# Patient Record
Sex: Male | Born: 1975
Health system: Southern US, Community
[De-identification: ages and names within clinical notes are randomized; demographics above are authoritative.]

## PROBLEM LIST (undated history)

## (undated) ENCOUNTER — Emergency Department (HOSPITAL_COMMUNITY): Admission: EM | Payer: Medicare Other

## (undated) DIAGNOSIS — J40 Bronchitis, not specified as acute or chronic: Secondary | ICD-10-CM

## (undated) DIAGNOSIS — E119 Type 2 diabetes mellitus without complications: Secondary | ICD-10-CM

## (undated) DIAGNOSIS — F319 Bipolar disorder, unspecified: Secondary | ICD-10-CM

## (undated) DIAGNOSIS — E785 Hyperlipidemia, unspecified: Secondary | ICD-10-CM

## (undated) DIAGNOSIS — F200 Paranoid schizophrenia: Secondary | ICD-10-CM

## (undated) DIAGNOSIS — F209 Schizophrenia, unspecified: Secondary | ICD-10-CM

## (undated) HISTORY — PX: TESTICLE IMPLANTATION TO THIGH: SHX6432

## (undated) HISTORY — PX: TESTICLE SURGERY: SHX794

## (undated) NOTE — *Deleted (*Deleted)
.  Psychoeducational Group Note    Date: 02-05-20 Time: 0900-1000    Goal Setting   Purpose of Group: This group helps to provide patients with the steps of setting a goal that is specific, measurable, attainable, realistic and time specific. A discussion on how we keep ourselves stuck with negative self talk.    Participation Level:  Active  Participation Quality limited:    Affect:  flat  Cognitive:  limited  Insight:  limited  Engagement in Group:  Engaged  Additional Comments:  Pt could not give an energy level. Interrupted the group several times   Dione Housekeeper

---

## 2003-10-05 ENCOUNTER — Emergency Department (HOSPITAL_COMMUNITY): Admission: EM | Admit: 2003-10-05 | Discharge: 2003-10-05 | Payer: Self-pay | Admitting: Emergency Medicine

## 2006-07-28 ENCOUNTER — Ambulatory Visit: Payer: Self-pay | Admitting: Orthopedic Surgery

## 2006-07-29 ENCOUNTER — Encounter (HOSPITAL_COMMUNITY): Admission: RE | Admit: 2006-07-29 | Discharge: 2006-09-03 | Payer: Self-pay | Admitting: Orthopaedic Surgery

## 2008-01-08 ENCOUNTER — Encounter: Admission: RE | Admit: 2008-01-08 | Discharge: 2008-01-08 | Payer: Self-pay | Admitting: Family Medicine

## 2011-01-12 ENCOUNTER — Emergency Department (HOSPITAL_COMMUNITY)
Admission: EM | Admit: 2011-01-12 | Discharge: 2011-01-12 | Disposition: A | Discharge status: Home / Self Care | Payer: Medicare Other | Attending: Emergency Medicine | Admitting: Emergency Medicine

## 2011-01-12 ENCOUNTER — Encounter: Payer: Self-pay | Admitting: *Deleted

## 2011-01-12 DIAGNOSIS — F17200 Nicotine dependence, unspecified, uncomplicated: Secondary | ICD-10-CM | POA: Insufficient documentation

## 2011-01-12 DIAGNOSIS — F39 Unspecified mood [affective] disorder: Secondary | ICD-10-CM | POA: Insufficient documentation

## 2011-01-12 DIAGNOSIS — F200 Paranoid schizophrenia: Secondary | ICD-10-CM | POA: Insufficient documentation

## 2011-01-12 DIAGNOSIS — E785 Hyperlipidemia, unspecified: Secondary | ICD-10-CM | POA: Insufficient documentation

## 2011-01-12 HISTORY — DX: Paranoid schizophrenia: F20.0

## 2011-01-12 HISTORY — DX: Hyperlipidemia, unspecified: E78.5

## 2011-01-12 LAB — DIFFERENTIAL
Basophils Absolute: 0.1 10*3/uL (ref 0.0–0.1)
Basophils Relative: 0 % (ref 0–1)
Eosinophils Absolute: 0.3 10*3/uL (ref 0.0–0.7)
Lymphs Abs: 3.7 10*3/uL (ref 0.7–4.0)
Monocytes Absolute: 0.7 10*3/uL (ref 0.1–1.0)
Monocytes Relative: 4 % (ref 3–12)
Neutro Abs: 12.3 10*3/uL — ABNORMAL HIGH (ref 1.7–7.7)

## 2011-01-12 LAB — RAPID URINE DRUG SCREEN, HOSP PERFORMED
Cocaine: NOT DETECTED
Tetrahydrocannabinol: NOT DETECTED

## 2011-01-12 LAB — CBC
HCT: 43.8 % (ref 39.0–52.0)
Hemoglobin: 15.6 g/dL (ref 13.0–17.0)
RBC: 5.04 MIL/uL (ref 4.22–5.81)
RDW: 12.2 % (ref 11.5–15.5)
WBC: 17 10*3/uL — ABNORMAL HIGH (ref 4.0–10.5)

## 2011-01-12 LAB — BASIC METABOLIC PANEL: Sodium: 137 mEq/L (ref 135–145)

## 2011-01-12 NOTE — ED Provider Notes (Signed)
History     CSN: [id number] Arrival date & time: 01/12/2011  4:24 PM Scribed for Donnetta Hutching, MD, the patient was seen in room APA17/APA17. This chart was scribed by Katha Cabal. This patient's care was started at 5:02PM.    Chief Complaint  Patient presents with  . Medical Clearance   HPI  [name] is a 35 y.o. male brought in by mother to the Emergency Department complaining of AMS.  Patient is upset overall increasing financial debt. Patient states that his biological father is a Programmer, multimedia and believes that he is involved in an Internet sweepstakes gambling business.  Patient expressed concern regarding his father no being willing to help him get out of debt.  Adds that his father "has all that money and won't help me". Denies SI and HI.  Patient is a third shift grocery stocker.  Patient admits previous drug use but no current drug use.  Patient's mother states that her son has hx of paranoid schizophrenia (compliant with medication) and has been confused.   Patient has been treated previously at [facility].     PAST MEDICAL HISTORY:  Past Medical History  Diagnosis Date  . Paranoid schizophrenia   . Hyperlipidemia     PAST SURGICAL HISTORY:  Past Surgical History  Procedure Date  . Testicle surgery     MEDICATIONS:  Previous Medications   ATORVASTATIN (LIPITOR) 20 MG TABLET    Take 20 mg by mouth daily.       ALLERGIES:  Allergies as of 01/12/2011  . (No Known Allergies)     FAMILY HISTORY:  History reviewed. No pertinent family history.   SOCIAL HISTORY: History   Social History  . Marital Status: Single    Spouse Name: N/A    Number of Children: N/A  . Years of Education: N/A   Social History Main Topics  . Smoking status: Current Everyday Smoker -- 1.0 packs/day  . Smokeless tobacco: None  . Alcohol Use: No  . Drug Use: No  . Sexually Active: Yes   Other Topics Concern  . None   Social History Narrative  . None     Review of Systems  10  Systems reviewed and are negative for acute change except as noted in the HPI.    Physical Exam  Nursing note and vitals reviewed. Constitutional: He is oriented to person, place, and time. No distress.       Appearance consistent with age of record  HENT:  Head: Normocephalic and atraumatic.  Right Ear: External ear normal.  Left Ear: External ear normal.  Nose: Nose normal.  Mouth/Throat: Oropharynx is clear and moist.  Eyes: Conjunctivae are normal.  Neck: Neck supple.  Cardiovascular: Normal rate and regular rhythm.  Exam reveals no gallop and no friction rub.   No murmur heard. Pulmonary/Chest: Effort normal and breath sounds normal. He has no wheezes. He has no rhonchi. He has no rales. He exhibits no tenderness.  Abdominal: Soft. There is no tenderness.  Musculoskeletal: Normal range of motion.       Normal appearance of extremities  Neurological: He is alert and oriented to person, place, and time. No sensory deficit.  Skin: No rash noted.       Color normal  Psychiatric: He has a normal mood and affect.    ED Course  Procedures  OTHER DATA REVIEWED: Nursing notes, vital signs, and past medical records reviewed.   LABS / RADIOLOGY:  Results for orders placed during the hospital  encounter of 01/12/11  CBC      Component Value Range   WBC 17.0 (*) 4.0 - 10.5 (K/uL)   RBC 5.04  4.22 - 5.81 (MIL/uL)   Hemoglobin 15.6  13.0 - 17.0 (g/dL)   HCT 40.9  81.1 - 91.4 (%)   MCV 86.9  78.0 - 100.0 (fL)   MCH 31.0  26.0 - 34.0 (pg)   MCHC 35.6  30.0 - 36.0 (g/dL)   RDW 78.2  95.6 - 21.3 (%)   Platelets 293  150 - 400 (K/uL)  DIFFERENTIAL      Component Value Range   Neutrophils Relative 72  43 - 77 (%)   Neutro Abs 12.3 (*) 1.7 - 7.7 (K/uL)   Lymphocytes Relative 22  12 - 46 (%)   Lymphs Abs 3.7  0.7 - 4.0 (K/uL)   Monocytes Relative 4  3 - 12 (%)   Monocytes Absolute 0.7  0.1 - 1.0 (K/uL)   Eosinophils Relative 2  0 - 5 (%)   Eosinophils Absolute 0.3  0.0 - 0.7  (K/uL)   Basophils Relative 0  0 - 1 (%)   Basophils Absolute 0.1  0.0 - 0.1 (K/uL)  BASIC METABOLIC PANEL      Component Value Range   Sodium 137  135 - 145 (mEq/L)   Potassium 3.4 (*) 3.5 - 5.1 (mEq/L)   Chloride 101  96 - 112 (mEq/L)   CO2 28  19 - 32 (mEq/L)   Glucose, Bld 100 (*) 70 - 99 (mg/dL)   BUN 11  6 - 23 (mg/dL)   Creatinine, Ser 0.86  0.50 - 1.35 (mg/dL)   Calcium 9.8  8.4 - 57.8 (mg/dL)   GFR calc non Af Amer >60  >60 (mL/min)   GFR calc Af Amer >60  >60 (mL/min)  ETHANOL      Component Value Range   Alcohol, Ethyl (B) <11  0 - 11 (mg/dL)     No results found.     ED COURSE / COORDINATION OF CARE: 5:07 PM  Counsel with Behavioral Health.   Orders Placed This Encounter  Procedures  . CBC  . Differential  . Basic metabolic panel  . Ethanol  . Urine rapid drug screen (hosp performed)  . Consult to call act team    MDM: Patient has had mental health problems in the past. Mother reports some tangential thinking. Will give behavioral health consult. Elevated white count noted. No clinical significance. Patient is not suicidal or homicidal     MEDICATIONS GIVEN IN THE E.D. Scheduled Meds:   Continuous Infusions:    I personally performed the services described in this documentation, which was scribed in my presence. The recorded information has been reviewed and considered. Donnetta Hutching, MD            Donnetta Hutching, MD 01/12/11 2107

## 2011-01-12 NOTE — ED Notes (Signed)
Pt mother brings pt to ed today d/t behavioral disturbances. Pts mother states pt has been confused, talking about fixing cars he does not have, having outbreaks at work.

## 2011-03-11 ENCOUNTER — Encounter (HOSPITAL_COMMUNITY): Payer: Self-pay | Admitting: Emergency Medicine

## 2011-03-11 ENCOUNTER — Inpatient Hospital Stay (HOSPITAL_COMMUNITY)
Admission: AD | Admit: 2011-03-11 | Discharge: 2011-03-15 | DRG: 885 | Disposition: A | Discharge status: Home / Self Care | Payer: Medicare Other | Source: Ambulatory Visit | Attending: Psychiatry | Admitting: Psychiatry

## 2011-03-11 ENCOUNTER — Emergency Department (HOSPITAL_COMMUNITY)
Admission: EM | Admit: 2011-03-11 | Discharge: 2011-03-11 | Disposition: A | Discharge status: Other Acute Inpatient Hospital | Payer: Medicare Other | Source: Home / Self Care | Attending: Emergency Medicine | Admitting: Emergency Medicine

## 2011-03-11 DIAGNOSIS — F17200 Nicotine dependence, unspecified, uncomplicated: Secondary | ICD-10-CM

## 2011-03-11 DIAGNOSIS — F200 Paranoid schizophrenia: Secondary | ICD-10-CM

## 2011-03-11 DIAGNOSIS — E785 Hyperlipidemia, unspecified: Secondary | ICD-10-CM

## 2011-03-11 DIAGNOSIS — F209 Schizophrenia, unspecified: Secondary | ICD-10-CM

## 2011-03-11 DIAGNOSIS — F259 Schizoaffective disorder, unspecified: Principal | ICD-10-CM | POA: Diagnosis present

## 2011-03-11 LAB — DIFFERENTIAL
Basophils Absolute: 0.1 10*3/uL (ref 0.0–0.1)
Eosinophils Relative: 2 % (ref 0–5)
Lymphocytes Relative: 24 % (ref 12–46)
Monocytes Absolute: 0.7 10*3/uL (ref 0.1–1.0)
Monocytes Relative: 5 % (ref 3–12)
Neutro Abs: 8.2 10*3/uL — ABNORMAL HIGH (ref 1.7–7.7)
Neutrophils Relative %: 68 % (ref 43–77)

## 2011-03-11 LAB — CBC
HCT: 41.9 % (ref 39.0–52.0)
Hemoglobin: 14.5 g/dL (ref 13.0–17.0)
MCH: 30.1 pg (ref 26.0–34.0)
MCHC: 34.6 g/dL (ref 30.0–36.0)
MCV: 86.9 fL (ref 78.0–100.0)
RBC: 4.82 MIL/uL (ref 4.22–5.81)

## 2011-03-11 LAB — RAPID URINE DRUG SCREEN, HOSP PERFORMED: Amphetamines: NOT DETECTED

## 2011-03-11 LAB — BASIC METABOLIC PANEL
BUN: 11 mg/dL (ref 6–23)
Chloride: 102 mEq/L (ref 96–112)
Creatinine, Ser: 1.01 mg/dL (ref 0.50–1.35)
GFR calc non Af Amer: >90 mL/min (ref >90)
Sodium: 138 mEq/L (ref 135–145)

## 2011-03-11 LAB — ETHANOL: Alcohol, Ethyl (B): <11 mg/dL (ref 0–11)

## 2011-03-11 MED ORDER — IBUPROFEN 400 MG PO TABS: 600.0000 mg | ORAL_TABLET | Freq: Three times a day (TID) | ORAL | Status: DC | PRN

## 2011-03-11 MED ORDER — HALOPERIDOL 5 MG PO TABS: 5.0000 mg | ORAL_TABLET | Freq: Two times a day (BID) | ORAL | Status: DC

## 2011-03-11 MED ORDER — LORAZEPAM 1 MG PO TABS: 1.0000 mg | ORAL_TABLET | ORAL | Status: DC | PRN

## 2011-03-11 MED ORDER — ACETAMINOPHEN 325 MG PO TABS: 650.0000 mg | ORAL_TABLET | ORAL | Status: DC | PRN

## 2011-03-11 MED ORDER — ONDANSETRON HCL 4 MG PO TABS
4.0000 mg | ORAL_TABLET | Freq: Three times a day (TID) | ORAL | Status: DC | PRN
Start: 2011-03-11 — End: 2011-03-11

## 2011-03-11 MED ORDER — HALOPERIDOL 5 MG PO TABS: 5.0000 mg | ORAL_TABLET | Freq: Once | ORAL | Status: DC

## 2011-03-11 MED ORDER — HALOPERIDOL 5 MG PO TABS
5.0000 mg | ORAL_TABLET | Freq: Once | ORAL | Status: AC
  Administered 2011-03-11: 5 mg via ORAL
  Filled 2011-03-11: qty 1

## 2011-03-11 NOTE — ED Notes (Signed)
Pt escorted to br with assistance of sitter. nad noted.

## 2011-03-11 NOTE — ED Notes (Signed)
Pt presents as a Physicist, medical. Pt brought in by [employer] for "rapping" per pt. Pt talking about wanting to be " a superhero like spiderman". Pt calm and cooperative and makes eye contact. Pt denies SI/HI.

## 2011-03-11 NOTE — ED Provider Notes (Signed)
History     CSN: [id number] Arrival date & time: 03/11/2011  1:45 PM   First MD Initiated Contact with Patient 03/11/11 1549      Chief Complaint  Patient presents with  . Medical Clearance     HPI Chief Complaint: "I want to be a superhero" Onset - an unknown time ago Course - worsening Severity - moderate Associated symptoms - delusions History is limited due to patient's mental status.  He is here for wanting to be a superhero.  He thinks he is spiderman.  He also reports he has an idea for the next [name] biscuit.   He denies HA/CP/SOB/Weakness No recent trauma  Past Medical History  Diagnosis Date  . Paranoid schizophrenia   . Hyperlipidemia     Past Surgical History  Procedure Date  . Testicle surgery     History reviewed. No pertinent family history.  History  Substance Use Topics  . Smoking status: Current Everyday Smoker -- 1.0 packs/day  . Smokeless tobacco: Not on file  . Alcohol Use: No      Review of Systems  All other systems reviewed and are negative.    Allergies  Review of patient's allergies indicates no known allergies.  Home Medications   Current Outpatient Rx  Name Route Sig Dispense Refill  . ATORVASTATIN CALCIUM 20 MG PO TABS Oral Take 20 mg by mouth daily.      Marland Kitchen HALOPERIDOL DECANOATE 100 MG/ML IM SOLN Intramuscular Inject 110 mg into the muscle every 28 (twenty-eight) days.        BP 138/88  Pulse 95  Temp(Src) 99.2 F (37.3 C) (Oral)  Resp 18  Ht 6\' 1"  (1.854 m)  Wt 231 lb (104.781 kg)  BMI 30.48 kg/m2  SpO2 99%  Physical Exam  CONSTITUTIONAL: Well developed/well nourished HEAD AND FACE: Normocephalic/atraumatic EYES: EOMI/PERRL ENMT: Mucous membranes moist NECK: supple no meningeal signs CV: S1/S2 noted, no murmurs/rubs/gallops noted LUNGS: Lungs are clear to auscultation bilaterally, no apparent distress ABDOMEN: soft, nontender, no rebound or guarding NEURO: Pt is awake/alert, moves all  extremitiesx4 EXTREMITIES: pulses normal, full ROM SKIN: warm, color normal PSYCH: friendly, talkative, tells me he is spiderman.  He is alert/oriented x3   ED Course  Procedures   Labs Reviewed  CBC - Abnormal; Notable for the following:    WBC 12.0 (*)    All other components within normal limits  DIFFERENTIAL - Abnormal; Notable for the following:    Neutro Abs 8.2 (*)    All other components within normal limits  BASIC METABOLIC PANEL  ETHANOL  URINE RAPID DRUG SCREEN (HOSP PERFORMED)      MDM  Nursing notes reviewed and considered in documentation All labs/vitals reviewed and considered Previous records reviewed and considered    3:56 PM  D/w ACT will see patient        Joya Gaskins, MD 03/11/11 1557

## 2011-03-11 NOTE — ED Notes (Signed)
Pt requesting to " go somewhere I can smoke. I don't want to go to [facility] I'm telling you now I'm not going where I can't smoke." pt stating to become agitated.

## 2011-03-11 NOTE — ED Notes (Signed)
Dr. Bebe Shaggy made aware of pt becoming increasingly agitated. New order received.

## 2011-03-11 NOTE — ED Notes (Signed)
[name] here and pt recently dc from [facility] 2 days ago. [name] with ACT team faxed info on pt to [facility]. Waiting to here if bed available.

## 2011-03-11 NOTE — ED Notes (Signed)
Dinner tray given

## 2011-03-11 NOTE — BH Assessment (Addendum)
Assessment Note   [name] is an 35 y.o. male.   Axis I: Chronic Paranoid Schizophrenia Axis II: Deferred Axis III:  Past Medical History  Diagnosis Date  . Paranoid schizophrenia   . Hyperlipidemia    Axis IV: other psychosocial or environmental problems Axis V: 21-30 behavior considerably influenced by delusions or hallucinations OR serious impairment in judgment, communication OR inability to function in almost all areas  Past Medical History:  Past Medical History  Diagnosis Date  . Paranoid schizophrenia   . Hyperlipidemia     Past Surgical History  Procedure Date  . Testicle surgery     Family History: History reviewed. No pertinent family history.  Social History:  reports that he has been smoking.  He does not have any smokeless tobacco history on file. He reports that he does not drink alcohol or use illicit drugs.  Allergies: No Known Allergies  Home Medications:  Medications Prior to Admission  Medication Dose Route Frequency Provider Last Rate Last Dose  . acetaminophen (TYLENOL) tablet 650 mg  650 mg Oral Q4H PRN Joya Gaskins, MD      . haloperidol (HALDOL) tablet 5 mg  5 mg Oral BID Joya Gaskins, MD      . haloperidol (HALDOL) tablet 5 mg  5 mg Oral Once Joya Gaskins, MD      . ibuprofen (ADVIL,MOTRIN) tablet 600 mg  600 mg Oral Q8H PRN Joya Gaskins, MD      . LORazepam (ATIVAN) tablet 1 mg  1 mg Oral Q2H PRN Joya Gaskins, MD      . ondansetron [facility]) tablet 4 mg  4 mg Oral Q8H PRN Joya Gaskins, MD       Medications Prior to Admission  Medication Sig Dispense Refill  . atorvastatin (LIPITOR) 20 MG tablet Take 20 mg by mouth daily.          OB/GYN Status:  No LMP for male patient.  General Assessment Data Assessment Number: 3  Living Arrangements: Alone Can pt return to current living arrangement?: Yes Admission Status: Voluntary Is patient capable of signing voluntary admission?: Yes Transfer from: Acute  Hospital Referral Source: Self/Family/Friend  Risk to self Suicidal Ideation: No Suicidal Intent: No Is patient at risk for suicide?: No Suicidal Plan?: No Access to Means: No What has been your use of drugs/alcohol within the last 12 months?: none Other Self Harm Risks: none Triggers for Past Attempts: None known Intentional Self Injurious Behavior: None Family Suicide History: No Recent stressful life event(s): Other (Comment) Persecutory voices/beliefs?: No Depression: No Substance abuse history and/or treatment for substance abuse?: No Suicide prevention information given to non-admitted patients: Not applicable  Risk to Others Homicidal Ideation: No Thoughts of Harm to Others: No Current Homicidal Intent: No Current Homicidal Plan: No Access to Homicidal Means: No Identified Victim: none History of harm to others?: No Assessment of Violence: None Noted Violent Behavior Description: none Does patient have access to weapons?: No Criminal Charges Pending?: No Does patient have a court date: No  Mental Status Report Appear/Hygiene: Improved Eye Contact: Fair Motor Activity: Hyperactivity;Restlessness Speech: Pressured;Loud;Tangential Level of Consciousness: Alert Mood: Euphoric;Preoccupied Affect: Euphoric;Preoccupied;Silly Anxiety Level: None Thought Processes: Irrelevant;Circumstantial;Tangential Judgement: Impaired Orientation: Person;Place;Situation;Time Obsessive Compulsive Thoughts/Behaviors: Moderate  Cognitive Functioning Concentration: Decreased Memory: Recent Impaired;Remote Impaired IQ: Average Insight: Poor Impulse Control: Poor Appetite: Good Sleep: No Change Total Hours of Sleep: 6  Vegetative Symptoms: None  Prior Inpatient/Outpatient Therapy Prior Therapy: Inpatient Prior Therapy  Dates: 11/12 Prior Therapy Facilty/Provider(s): [facility] Reason for Treatment: psychosis            Values / Beliefs Cultural Requests During  Hospitalization: None Spiritual Requests During Hospitalization: None        Additional Information 1:1 In Past 12 Months?: No CIRT Risk: No Elopement Risk: No Does patient have medical clearance?: Yes     Disposition:  Disposition Disposition of Patient: Inpatient treatment program Type of inpatient treatment program: Adult The patient was brought to the ED by law enforcement after he was in a parking lot of gas station. He was playing loud music and rapping along with it. He would not turn down the music or leave so polices fwere called. In the Ed he is very loud and grandiose. He talks about writing [name] commercial. He says he is going somewhere with [name].  He has very pressured speech. He denies any SI or HI.  He is follow by Day James Leriche and was released from [facility] just 2 days ago. On Site Evaluation by:   Reviewed with Physician:     [name] 03/11/2011 4:44 PM  Spoke with [name] at [facility], pt accepted by [name] to [name] room 405-2. [name] agrees with disposition. See support paperwork  [name] 03/10/1910

## 2011-03-11 NOTE — ED Notes (Signed)
Pt wanded by [employer]. Clothes taken and security given jewerly and wallet.

## 2011-03-11 NOTE — ED Notes (Signed)
Pt sleeping at this time. Dr. Bebe Shaggy made aware and PRN haldol dc

## 2011-03-11 NOTE — ED Notes (Signed)
Lisette at [facility] called to Decline Pt. Nurse and Hattie Perch informed.

## 2011-03-12 ENCOUNTER — Encounter (HOSPITAL_COMMUNITY): Payer: Self-pay

## 2011-03-12 DIAGNOSIS — F2089 Other schizophrenia: Secondary | ICD-10-CM

## 2011-03-12 DIAGNOSIS — F259 Schizoaffective disorder, unspecified: Secondary | ICD-10-CM | POA: Diagnosis present

## 2011-03-12 MED ORDER — ALUM & MAG HYDROXIDE-SIMETH 200-200-20 MG/5ML PO SUSP
30.0000 mL | ORAL | Status: DC | PRN
  Administered 2011-03-14: 30 mL via ORAL

## 2011-03-12 MED ORDER — INFLUENZA VIRUS VACC SPLIT PF IM SUSP
0.5000 mL | INTRAMUSCULAR | Status: AC
  Administered 2011-03-12: 0.5 mL via INTRAMUSCULAR

## 2011-03-12 MED ORDER — DIVALPROEX SODIUM 500 MG PO DR TAB
500.0000 mg | DELAYED_RELEASE_TABLET | Freq: Every day | ORAL | Status: DC
  Administered 2011-03-12: 500 mg via ORAL
  Filled 2011-03-12 (×2): qty 1

## 2011-03-12 MED ORDER — MAGNESIUM HYDROXIDE 400 MG/5ML PO SUSP: 30.0000 mL | Freq: Every day | ORAL | Status: DC | PRN

## 2011-03-12 MED ORDER — HALOPERIDOL 5 MG PO TABS
10.0000 mg | ORAL_TABLET | Freq: Every day | ORAL | Status: DC
  Administered 2011-03-12 – 2011-03-14 (×3): 10 mg via ORAL
  Filled 2011-03-12: qty 28
  Filled 2011-03-12 (×3): qty 2

## 2011-03-12 MED ORDER — TRAZODONE HCL 100 MG PO TABS
100.0000 mg | ORAL_TABLET | Freq: Every day | ORAL | Status: DC
Start: 2011-03-12 — End: 2011-03-15
  Administered 2011-03-12 – 2011-03-14 (×3): 100 mg via ORAL
  Filled 2011-03-12 (×2): qty 1
  Filled 2011-03-12: qty 14
  Filled 2011-03-12: qty 1

## 2011-03-12 MED ORDER — NICOTINE 21 MG/24HR TD PT24
21.0000 mg | MEDICATED_PATCH | Freq: Every day | TRANSDERMAL | Status: DC
  Filled 2011-03-12 (×5): qty 1

## 2011-03-12 MED ORDER — ONDANSETRON HCL 4 MG PO TABS: 4.0000 mg | ORAL_TABLET | Freq: Three times a day (TID) | ORAL | Status: DC | PRN

## 2011-03-12 MED ORDER — LORATADINE 10 MG PO TABS
10.0000 mg | ORAL_TABLET | Freq: Every day | ORAL | Status: DC
  Administered 2011-03-12 – 2011-03-15 (×4): 10 mg via ORAL
  Filled 2011-03-12 (×5): qty 1

## 2011-03-12 MED ORDER — IBUPROFEN 600 MG PO TABS: 600.0000 mg | ORAL_TABLET | Freq: Three times a day (TID) | ORAL | Status: DC | PRN

## 2011-03-12 MED ORDER — BENZTROPINE MESYLATE 1 MG PO TABS
1.0000 mg | ORAL_TABLET | Freq: Two times a day (BID) | ORAL | Status: DC
  Administered 2011-03-12 – 2011-03-15 (×6): 1 mg via ORAL
  Filled 2011-03-12: qty 28
  Filled 2011-03-12 (×6): qty 1
  Filled 2011-03-12: qty 28

## 2011-03-12 MED ORDER — ACETAMINOPHEN 325 MG PO TABS: 650.0000 mg | ORAL_TABLET | ORAL | Status: DC | PRN

## 2011-03-12 MED ORDER — LORAZEPAM 1 MG PO TABS: 1.0000 mg | ORAL_TABLET | ORAL | Status: DC | PRN

## 2011-03-12 NOTE — BH Assessment (Signed)
Psychiatric Admission Assessment Adult  Patient Identification:  [name] Date of Evaluation:  03/12/2011  Chief Complaint: agitation  History of Present Illness::  Patient was seen with Dr. Jessie Foot ADRIANN BALLWEG is an 35 y.o. male.  The patient was brought to the ED by law enforcement after he was in a parking lot of gas station. He was playing loud music and rapping along with it. He would not turn down the music or leave so polices were called. In the Ed he was very loud and grandiose. He talks about writing [name] commercial. He says he is going somewhere with [name]. He has very pressured speech. He denies any SI or HI. He is follow by [name] and was released from [facility] just 2 days ago. Patient reported compliant with medications. Lives alone and gave consent to contact mother.    Past Psychiatric History hx of schizophrenia and sees Dr. Eleonore Chiquito and is taking Haldol and Cogentin  Past Medical History:     Past Medical History  Diagnosis Date  . Paranoid schizophrenia   . Hyperlipidemia        Past Surgical History  Procedure Date  . Testicle surgery     Allergies: No Known Allergies  Current Medications:  Prior to Admission medications   Medication Sig Start Date End Date Taking? Authorizing Provider  atorvastatin (LIPITOR) 20 MG tablet Take 20 mg by mouth daily.     Yes Historical Provider, MD  benztropine (COGENTIN) 1 MG tablet Take 1 mg by mouth 2 (two) times daily.     Yes Historical Provider, MD  divalproex (DEPAKOTE) 500 MG DR tablet Take 500 mg by mouth at bedtime.     Yes Historical Provider, MD  loratadine (CLARITIN) 10 MG tablet Take 10 mg by mouth daily.     Yes Historical Provider, MD  traZODone (DESYREL) 100 MG tablet Take 100 mg by mouth at bedtime.     Yes Historical Provider, MD  haloperidol decanoate (HALDOL DECANOATE) 100 MG/ML injection Inject 130 mg into the muscle every 28 (twenty-eight) days.     Historical Provider, MD     Social History: Lives alone.  reports that he has been smoking Cigarettes.  He has been smoking about 1 pack James day. He does not have any smokeless tobacco history on file. He reports that he does not drink alcohol or use illicit drugs.   Family History:    History reviewed. No pertinent family history.  Mental Status Examination/Evaluation: Objective:  Appearance: Fairly Groomed  Psychomotor Activity:  Normal  Eye Contact::  Good  Speech:  Normal Rate  Volume:  Normal  Mood:    Affect:  Full Range  Thought Process:  Linear  Orientation:  Full  Thought Content:  Hallucinations: None  Suicidal Thoughts:  No  Homicidal Thoughts:  No  Judgement:  Impaired  Insight:  Shallow    Assessment:    AXIS I Schizoaffective Disorder  AXIS II Deferred  AXIS III See medical history.  AXIS IV other psychosocial or environmental problems  AXIS V 41-50 serious symptoms     Treatment Plan Summary: 1 initiate Haldol 10mg  Qhs, congentin 1 mg QHS,  2. Will obtain collateral information 3. Estimated LOS 4-5 days   AHLUWALIA,SHAMSHER S 11/6/201211:40 AM

## 2011-03-12 NOTE — Discharge Planning (Addendum)
Met with patient in Aftercare Planning Group.  He was agitated and demanding, easily irritated by any question or comment posed by Case Manager.  Refused to sign consent for [place] to get [facility] records, although he said we could call them for those records.  Did not seem to understand they will not be released without written consent, and did not want to hear explanation.    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  Most notable element of his presentation was the rapid irritation.  Patient lives alone and will return there, probably with father transporting.  He said it is okay for [place] to talk with his father Saeed Toren at [phone] but does not remember if signed consent.  Has been a patient at [facility] for years.  Patient had marked his depression at a 10 but when the scale was explained to him, he said he has absolutely no depression.  He stated he has no problem with being in the hospital since it is "all paid so why not?"  Case Manager contacted [facility] to ask whether he is a current patient.  He does have Medicaid so it might be possible to do a referral there if he is not already served by them.  Ambrose Mantle, LCSW 03/12/2011, 10:09 AM  Per State Regulation 482.30  This chart was reviewed for medical necessity with respect to the patient's Admission/Duration of stay.    Ambrose Mantle, LCSW  03/12/2011  Next review due:  03/15/2011

## 2011-03-12 NOTE — Progress Notes (Signed)
  He has been calm and cooperative today. Has not been rapping. Denies having  any discomfort, has been up and about and to groups  poor participation  In group. Stated that  He was here because the police brought him did not know why. Self inventory  Depression  1, hopelessness  1,  Denies SI thoughts.  Denies  A/H,V/H, .

## 2011-03-12 NOTE — Progress Notes (Signed)
  Pt. Presented to [facility] via  Police.  It was reported that patient thought he was spiderman and rapper Lil'Wayne.  Pt. Denies this with Clinical research associate.  Pt. States that he was on the street rapping just to see what people thought of his singing, to see if he could make it as a singer and the police ask him if he wanted to go to the hospital.  Pt. States that he told them he would do whatever they told him to do.  Pt. Was sleepy and difficult to assess but was calm and cooperative through most of the assessment.  Pt. Was agitated at times and said he was tired of answering the same questions over and over and just wanted to go to bed.  Writer assured pt. This would be his last questions for the night.  Pt. Was oriented to the adult unit and was offered food and fluids and escorted to the 400 hall.

## 2011-03-12 NOTE — Progress Notes (Signed)
Suicide Risk Assessment  Admission Assessment     Demographic factors:  Assessment Details Time of Assessment: Admission Information Obtained From: Patient Current Mental Status:  Current Mental Status:  (denies SI) Loss Factors:  Loss Factors:  (NA) Historical Factors:  Historical Factors:  (NA) Risk Reduction Factors:  Risk Reduction Factors: Employed  CLINICAL FACTORS:   Schizophrenia:   Paranoid or undifferentiated type  COGNITIVE FEATURES THAT CONTRIBUTE TO RISK:  Thought constriction (tunnel vision)    SUICIDE RISK:   Minimal: No identifiable suicidal ideation.  Patients presenting with no risk factors but with morbid ruminations; may be classified as minimal risk based on the severity of the depressive symptoms  PLAN OF CARE:   [name],[name] 03/12/2011, 12:16 PM

## 2011-03-12 NOTE — Progress Notes (Signed)
Pt. Has been in the dayroom interacting with his peers and staff.  Pt. Denies AH, SI and or HI.  Pt. States in group that he will be going to a local record company at discharge to see if he can cut a deal.  When alone pt. Training and development officer he will be returning to his apartment where he lives alone.  Pt. Also states that he has a job but will not elaborate on what he does.  Pt. Is calm and cooperative but becomes easily agitated when ask too many questions.  Unsure if pt. Believes he will get  A record deal or if pt. Is trying to impress a male peer.  No complaints of pain or discomfort noted at this time.

## 2011-03-12 NOTE — Tx Team (Signed)
Initial Interdisciplinary Treatment Plan  PATIENT STRENGTHS: (choose at least two) Active sense of humor Capable of independent living  PATIENT STRESSORS: Denies Stressors   PROBLEM LIST: Problem List/Patient Goals Date to be addressed Date deferred Reason deferred Estimated date of resolution  Pt. Denies any issues or goals 03/12/11     Psychosis/Reported by [employer] 03/12/11     Pt. Was rapping on the street corner told ED staff he was spiderman or Sanda Klein' Deniece Portela 03/12/11     Pt. Denies SI, HI or AH                                     DISCHARGE CRITERIA:  Ability to meet basic life and health needs Need for constant or close observation no longer present Verbal commitment to aftercare and medication compliance  PRELIMINARY DISCHARGE PLAN: Attend PHP/IOP Outpatient therapy Participate in family therapy Return to previous living arrangement Return to previous work or school arrangements  PATIENT/FAMIILY INVOLVEMENT: This treatment plan has been presented to and reviewed with the patient, SHENANDOAH VANDERGRIFF, and/or family member, .  The patient and family have been given the opportunity to ask questions and make suggestions.  Cooper Render 03/12/2011, 2:20 AM

## 2011-03-12 NOTE — Progress Notes (Signed)
[facility] Group Notes:  (Counselor/Nursing/MHT/Case Management/Adjunct)  03/12/2011 2:11 PM  Type of Therapy:  Group therapy  Participation Level:  Minimal  Participation Quality:  Attentive  Affect:  Blunted  Cognitive:  Oriented  Insight:  None  Engagement in Group:  Limited  Engagement in Therapy:  Limited  Modes of Intervention:  Education, Orientation and Support  Summary of Progress/Problems: Patient was avoidant of questions. Stated he was here to get away from [place]. Stated that police brought him here but could not explain why they had brought him here.   Ebone Alcivar, [name] 03/12/2011, 2:11 PM

## 2011-03-13 MED ORDER — DIVALPROEX SODIUM 500 MG PO DR TAB
1000.0000 mg | DELAYED_RELEASE_TABLET | Freq: Every day | ORAL | Status: DC
  Administered 2011-03-13 – 2011-03-14 (×2): 1000 mg via ORAL
  Filled 2011-03-13: qty 2
  Filled 2011-03-13: qty 1
  Filled 2011-03-13: qty 2
  Filled 2011-03-13: qty 28

## 2011-03-13 NOTE — Progress Notes (Signed)
  Pt. Was in dayroom interacting with his peers and staff. Pt. Reports he will be discharging in two days and will return to his apartment where he lives alone.  Pt. Is much calmer and less grandious tonight than the prior nights.  Pt. Rates his depression a 0.  Pt. Has no complaints of pain or discomfort noted this pm.

## 2011-03-13 NOTE — Progress Notes (Addendum)
Pt was up in dayroom upon first assessment.  He was given his self-assessment to fill out.  He rated both his depression and hopelessness 0.  He denied he ever felt depressed or suicidal.  He has been up for all groups and is interacting appropriate for the most part.  He is still a bit intrusive when in the presences of his peers.  He is still talking about becoming a Airline pilot.  He stated,"I had to contact [employer] to become my spiderman"  He went on to say that she has made up his own comic book characters and he enjoy reading and playing.  He did tell the treatment team this morning that he now realizes that he shouldn't put his "chains" on and start rapping in a crowd unless he had permission or was filing a video.  He is unsure of the exact date of his haldol decanoate so CM is suppose to find out and let Dr. Rogers Blocker know.  His new orders for today was to d/c prn tylenol and increase pt's depakote to 100 mg at bedtime.  He plans to return home at time of discharge.  ELOS 1-2 days. Pt will be followed by an ACT team when he goes home.

## 2011-03-13 NOTE — Progress Notes (Signed)
[facility] Group Notes:  (Counselor/Nursing/MHT/Case Management/Adjunct)  03/13/2011 4:10 PM  Type of Therapy:  Group Therapy  Participation Level:  Active  Participation Quality:  Attentive  Affect:  Appropriate  Cognitive:  Oriented  Insight:  Limited  Engagement in Group:  Limited  Engagement in Therapy:  Limited  Modes of Intervention:  Clarification, Education and Exploration  Summary of Progress/Problems: Patient discussed knowing the right people so he can make "millions". He stated he plans on rapping in his car while riding down the highway instead of rapping in public places. Patient has pretty good insight on understanding the situation and understanding what he should and should not do.   Wilmon Arms, Intern 03/13/2011, 4:10 PM Cosigned by Veto Kemps, MT-BC

## 2011-03-13 NOTE — Progress Notes (Signed)
  [name] is a 35 y.o. male [id number] 02-09-1976  Subjective/Objective: Patient is doing better.  He is less agitated, less disorganized, denies A/V hallucinations, denies SI/HI, is compliant with medications.  His affect has improved a lot.  His speech is normal in rhythm and volume, no flight of ideas is present.  Discussed with the patient about his medications.  We will get confirmation on his last dose of Haldol from [facility].   History   Social History  . Marital Status: Single    Spouse Name: N/A    Number of Children: N/A  . Years of Education: N/A   Social History Main Topics  . Smoking status: Current Everyday Smoker -- 1.0 packs/day    Types: Cigarettes  . Smokeless tobacco: None  . Alcohol Use: No  . Drug Use: No  . Sexually Active: Yes   Other Topics Concern  . None   Social History Narrative  . None     Lab Results:   BMET    Component Value Date/Time   NA 138 03/11/2011 1432   K 4.0 03/11/2011 1432   CL 102 03/11/2011 1432   CO2 27 03/11/2011 1432   GLUCOSE 94 03/11/2011 1432   BUN 11 03/11/2011 1432   CREATININE 1.01 03/11/2011 1432   CALCIUM 9.4 03/11/2011 1432   GFRNONAA >90 03/11/2011 1432   GFRAA >90 03/11/2011 1432    Medications:  Scheduled:     . benztropine  1 mg Oral BID  . divalproex  500 mg Oral QHS  . haloperidol  10 mg Oral QHS  . loratadine  10 mg Oral Daily  . nicotine  21 mg Transdermal Q0600  . traZODone  100 mg Oral QHS     PRN Meds acetaminophen, alum & mag hydroxide-simeth, ibuprofen, LORazepam, magnesium hydroxide, ondansetron   Plan: 1.  Continue current treatment plan 2.  ELOS 2-3 days 3.  I told the counselor to speak with the family to go over safety concerns and collateral information. 4.  No agitation reported by staff. 5.  No side effects reported by the patient from medications. 6.  Psychoeducation given to the patient.     [name] S 03/13/2011

## 2011-03-13 NOTE — Tx Team (Signed)
Interdisciplinary Treatment Plan Update (Adult)  Date:  03/13/2011  Time Reviewed:  8:32 AM   Progress in Treatment: Attending groups: Yes. and As evidenced by:  documented in chart Participating in groups:  Yes. and As evidenced by:  speaks when spoken to, does better 1:1 than in group Taking medication as prescribed:  Yes. and As evidenced by:  no refusals Tolerating medication:  Yes. and As evidenced by:  no noted or reported side effects Family/Significant othe contact made:  No, will contact:  mother (we have now been told she is also his payee) Patient understands diagnosis:  No. and As evidenced by:  no insight, poor judgment due to chronic illness Discussing patient identified problems/goals with staff:  Yes. and As evidenced by:  discusses freely what superheroes he wants to be Medical problems stabilized or resolved:  Yes. and As evidenced by:  no medical issues noted Denies suicidal/homicidal ideation: Yes. Issues/concerns per patient self-inventory:  No. Other:  New problem(s) identified: No, Describe:  is much less agitated  Reason for Continuation of Hospitalization: Aggression Delusions  Medication stabilization  Interventions implemented related to continuation of hospitalization:  Medication monitoring and adjustment, safety checks Q15 min., group therapy, psychoeducation, collateral contact  Additional comments:  Mother is payee, patient sees her fairly often.  Patient was visited by [facility] today and agreed to start services with that team.  Estimated length of stay:  3-4 days  Discharge Plan:  Will return to apartment by himself, and will follow up with [facility].  New goal(s):  Has been intermittently agitated throughout hospitalization  Review of initial/current patient goals per problem list:   1.  Goal(s):  Reduce psychotic symptoms to baseline  Met:  No  Target date:  By Discharge   As evidenced by:  Delusions getting close to baseline  2.   Goal (s):  Patient wants to be able to rap wtihout getting in trouble  Met:  Yes  Target date:  By Discharge   As evidenced by: able to identify need to only rap when alone, and not go into his dress/rapping routine unless has permission from the place where he is going to do it  3.  Goal(s):  Reduce agitation to manageable level  Met:  Yes  Target date:  By Discharge   As evidenced by:  Does better 1:1 than in group setting    Attendees: Patient:  [name]  03/13/2011  8:32 AM   Family:     Physician:  Dr. Eulogio Ditch 03/13/2011  8:32 AM   Nursing:   Robbie Louis, RN 03/13/2011  8:32 AM   Case Manager:  Ambrose Mantle, LCSW 03/13/2011  8:32 AM   Counselor:  Veto Kemps, MT-BC 03/13/2011  8:32 AM   Other:  Wilmon Arms, counseling intern 03/13/2011  8:32 AM   Other:     Other:     Other:      Scribe for Treatment Team:   Sarina Ser, 03/13/2011, 8:32 AM

## 2011-03-14 DIAGNOSIS — F259 Schizoaffective disorder, unspecified: Principal | ICD-10-CM

## 2011-03-14 NOTE — Discharge Planning (Signed)
Met with patient in Aftercare Planning Group.  He remains calm and reasonable, is no longer cursing or acting inappropriately.  Able to distinguish fantasy from reality.  Likely discharge tomorrow.  Researched how to set up follow-up with [facility] and whether that has to go through hospital reentry clinic.  Am awaiting call back with appointment time for ACTT.  Ambrose Mantle, LCSW 03/14/2011, 4:56 PM

## 2011-03-14 NOTE — Progress Notes (Signed)
[facility] Group Notes:  (Counselor/Nursing/MHT/Case Management/Adjunct)  03/14/2011 3:14 PM  Type of Therapy:  1:15PM Music Therapy  Participation Level: Limited  Participation Quality:  Drowsy and Inattentive  Affect: Appropriate  Cognitive:  Oriented  Insight:  Limited  Engagement in Group:  Limited  Engagement in Therapy:  Limited  Modes of Intervention:  Exploration and Music  Summary of Progress/Problems: Patient was sleeping during most of group discussion. However, he stated that one of the songs made him think of [place]. He also stated that music could be used as a tribute to others. He described losing three friends.   Wilmon Arms 03/14/2011, 3:14 PM

## 2011-03-14 NOTE — H&P (Addendum)
  I reviewed physical exam done in the ed, assessed the patient and agree with findings.

## 2011-03-14 NOTE — Progress Notes (Signed)
Patient has been appropriate and cooperative today.  Has attended some groups and was able to go to the gym with a large group of patients.  Talked about his job and how he felt he needed to get off of night shift, which I agree would likely improve his mental status along with his medications.  He is tolerating his medications and his appetite is good.  Has been interacting well with staff and peers today.

## 2011-03-14 NOTE — Progress Notes (Signed)
  Patient up and about. Dressed appropriately. Calm and cooperative. Good eye contact. Reports sleeping and appetite good. Denies psychosis. Denies any side effects to medications. Feels close to discharge. States next time he wants to rap he will not do it at a convenience store.

## 2011-03-14 NOTE — Progress Notes (Signed)
[facility] Group Notes:  (Counselor/Nursing/MHT/Case Management/Adjunct)  03/14/2011 11:35 AM  Type of Therapy:  Group Therapy  Participation Level:  Active  Participation Quality:  Attentive  Affect:  Appropriate  Cognitive:  Oriented  Insight:  Poor  Engagement in Group:  Limited  Engagement in Therapy:  Limited  Modes of Intervention:  Clarification, Education, Problem-solving and Exploration  Summary of Progress/Problems: Patient seemed to direct a lot of his attention and discussion to fame. When patient was asked by the counselor, "What are some ways you can redirect your thinking away from fame?" He then mentioned he often occupies thoughts of fame because he has a lot of debt. He stated he use to gamble in the past and has loans that he needs to pay off. [name] also discussed a time when he was previously admitted to another hospital where he explained his thoughts towards "the death of Donn Pierini and Achille Rich". 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  Wilmon Arms, Intern 03/14/2011, 11:35 AM Cosigned by Veto Kemps, MT-BC

## 2011-03-14 NOTE — Progress Notes (Signed)
Adult Services Patient-Family Contact/Session  Attendees:  Patient's mother, Merlinda Frederick [phone])  Goal(s):  Collateral  Safety Concerns:  None  Narrative:  Mother reported that patient has had 4 hospitalizations since September 28th of this year. Reported that his first hospitalization since [address] [facility]) was in September. He has been at [employer] two times and and overnight visit to [place]. She stated that police have called several times for her to come pick him up from parking lots. Not sure why his behavior has changed but usually is calm, kind, caring and humble. She stated she doesn't even know her son anymore and that he angry about everything. He has threatened to harm a person that he says gave him LSD years ago and he thinks that he is currently making all his problems. She stated that one of the main stressors is financial debt. She stated that he has an addiction to gambling and has taken out several loans that he now can not afford. Mother is the payee but she says and manages his check but he manages to get these loans on his own. She says this is why he has started talking about making it big so he can afford to pay bills, get her a mansion, and other extravagant things. She said he has not talked about being famous before these last episodes. She stated he has always like super heroes. He has been compliant with injections because he doesn't like to take pills. Mother would like for him to stay somewhere long enough that he can get help and not have to come back in the hospital.  Reported to her that a community support team person had been to visit him here and this would be set up at discharge. This support along with [facility] should help him stay out of the hospital. Mother also interested in getting Health Care Power of Attorney in addition to being patient's payee. Mother reported that patient doesn't like living by himself and wants to get a roommate.   Barrier(s):   None  Interventions:  Colateral, discharge planning  Recommendation(s):  Follow up outpatient, community support  Follow-up Required:  No  Explanation:    Veto Kemps 03/14/2011, 12:52 PM

## 2011-03-15 MED ORDER — HALOPERIDOL 10 MG PO TABS: 10.0000 mg | ORAL_TABLET | Freq: Every day | ORAL | Status: DC

## 2011-03-15 MED ORDER — DIVALPROEX SODIUM 500 MG PO DR TAB: 1000.0000 mg | DELAYED_RELEASE_TABLET | Freq: Every day | ORAL | Status: DC

## 2011-03-15 NOTE — Progress Notes (Signed)
Pt D/Ced home. Pt understands all D/C instructions, f/u appointments. Pt denies SI/HI, given samples at D/C

## 2011-03-15 NOTE — Progress Notes (Signed)
Pt up at 2345 complaining of indigestion. He denies any other physical problems. Affect flat, mood neutral, sleepy. Pt given maalox and on R/A at 2445 he is asleep. He denies SI/HI and remains safe. Edsel Petrin RN

## 2011-03-15 NOTE — Progress Notes (Signed)
Suicide Risk Assessment  Discharge Assessment     Demographic factors:  Assessment Details Time of Assessment: Admission Information Obtained From: Patient Current Mental Status:  Current Mental Status:  (denies SI) Risk Reduction Factors:  Risk Reduction Factors: Employed  CLINICAL FACTORS:   Schizophrenia:   Paranoid or undifferentiated type  COGNITIVE FEATURES THAT CONTRIBUTE TO RISK:  Thought constriction (tunnel vision)    SUICIDE RISK:   Minimal: No identifiable suicidal ideation.  Patients presenting with no risk factors but with morbid ruminations; may be classified as minimal risk based on the severity of the depressive symptoms  PLAN OF CARE: SEE DISCHARGE SUMMARY  [name] S 03/15/2011, 9:30 AM

## 2011-03-15 NOTE — Discharge Summary (Signed)
Physician Discharge Summary  Patient ID: [name] MRN: [id number] DOB/AGE: 1975-11-29 35 y.o.  Admit date: 03/11/2011 Discharge date: 03/15/2011  Hospital Course:  [name] is an 35 y.o. male.  The patient was brought to the ED by law enforcement after he was in a parking lot of gas station. He was playing loud music and rapping along with it. He would not turn down the music or leave so polices were called. In the Ed he was very loud and grandiose. He talks about writing [name] commercial. He says he is going somewhere with [name]. He has very pressured speech. He denies any SI or HI. He is follow by [name] and was [facility] from [facility] just 2 days ago.  Patient reported compliant with medications. Lives alone and gave consent to contact mother. She was started on his up regular medications were Haldol 10 mg at bedtime. His Depakote was increased from [phone] mg at bedtime to control his manic symptoms. Patient participated in all the groups no agitation reported by the staff throughout her stay in the hospital. By the time of discharge patient is logical and goal-directed not suicidal homicidal his speech is normal in rate rhythm and volume has no flight of ideas not responding to inner stimuli denies audiovisual hallucinations. Patient mother was contacted and she don't have any safety concerns at the time of discharge. Psychoeducation given to the patient regarding compliance with treatment and with followup in the outpatient setting. Patient had 3 admissions since September but before that since 1988 patient didn't had any admissions. I would recommend the ACT physician to reevaluate his medications and follow him closely. No Haldol decanoate injection was given in this admission.  Discharge Diagnoses:  Principal Problem:  *Schizoaffective disorder currently under remission.   Discharged Condition:    Discharge Orders    Future Orders Please Complete By Expires   Diet - low sodium heart healthy      Activity as tolerated - No restrictions        Current Discharge Medication List    START taking these medications   Details  haloperidol (HALDOL) 10 MG tablet Take 1 tablet (10 mg total) by mouth at bedtime. Qty: 30 tablet, Refills: 0      CONTINUE these medications which have CHANGED   Details  divalproex (DEPAKOTE) 500 MG DR tablet Take 2 tablets (1,000 mg total) by mouth at bedtime. Qty: 60 tablet, Refills: 0      CONTINUE these medications which have NOT CHANGED   Details  atorvastatin (LIPITOR) 20 MG tablet Take 20 mg by mouth daily.      benztropine (COGENTIN) 1 MG tablet Take 1 mg by mouth 2 (two) times daily.      loratadine (CLARITIN) 10 MG tablet Take 10 mg by mouth daily.      traZODone (DESYREL) 100 MG tablet Take 100 mg by mouth at bedtime.      haloperidol decanoate (HALDOL DECANOATE) 100 MG/ML injection Inject 130 mg into the muscle every 28 (twenty-eight) days.          Signed: Kiamesha Samet S 03/15/2011, 11:05 AM

## 2011-03-15 NOTE — Progress Notes (Signed)
[facility] Case Management Discharge Plan:  Will you be returning to the same living situation after discharge: Yes,  live in apartment alone Would you like a referral for services when you are discharged:Yes,  agreeable to remain at [facility], switch to ACTT Do you have access to transportation at discharge:Yes,  mother and stepfather to transport home Do you have the ability to pay for your medications:Yes,  has insurance, no problems paying for meds  Interagency Information:   Release of information to be signed at D/C  Patient to Follow up at:  Follow-up Information    Follow up with [facility]) on 03/20/2011. (Please go to [facility] for your intake appointment at 10:30AM)    Contact information:   [address], [place], Telephone:  [phone]         Patient denies SI/HI:   Yes,  no SI throughout hospital stay    Safety Planning and Suicide Prevention discussed:  Yes,  including risk factors, warning signs, and what to do if SI presents or No.  Barrier to discharge identified:No.  Summary and Recommendations:  There are no barriers to discharge.   Sarina Ser 03/15/2011, 12:21 PM

## 2011-03-15 NOTE — Discharge Summary (Signed)
Discharge Note  Patient:  [name] is an 35 y.o., male DOB:  1976/01/09  Date of Admission:  03/11/2011  Date of Discharge:  03/15/2011  Axis Diagnosis:  Axis I: Schizoaffective Disorder  Level of Care:  OP  Discharge destination:  Home  Is patient on multiple antipsychotic therapies at discharge:  No    Has Patient had three or more failed trials of antipsychotic monotherapy by history:  No  Patient phone:  [phone] (home)  Patient address:   [address] [place] [address] [place] [address],    AHLUWALIA,SHAMSHER S 03/15/2011, 9:31 AM

## 2011-03-15 NOTE — Tx Team (Signed)
Interdisciplinary Treatment Plan Update (Adult)  Date:  03/15/2011  Time Reviewed:  10:32 AM   Progress in Treatment: Attending groups: Yes. Participating in groups:  Yes. Taking medication as prescribed:  Yes. Tolerating medication:  Yes. and As evidenced by:  No reported or noted side effects Family/Significant othe contact made:  Yes, individual(s) contacted:  mother Patient understands diagnosis:  Yes. Discussing patient identified problems/goals with staff:  Yes. Medical problems stabilized or resolved:  Yes. and As evidenced by:  no medical issues Denies suicidal/homicidal ideation: Yes. Issues/concerns per patient self-inventory:  No. Other:  New problem(s) identified: No, Describe:  is stable for discharge  Reason for Continuation of Hospitalization: Not applicable  Interventions implemented related to continuation of hospitalization:  None  Additional comments:  Not applicable  Estimated length of stay:  Discharge today  Discharge Plan:  Live in apartment by self, start with [facility]  New goal(s):  Not applicable  Review of initial/current patient goals per problem list:   1.  Goal(s):  Reduce psychotic symptoms to baseline  Met:  Yes  Target date:  By Discharge   As evidenced by:  Calm, not internally preoccupied, increased insight/judgment  2.  Goal (s):n  Patient wants to be able to rap without getting in trouble  Met:  Yes  Target date:  By Discharge   As evidenced by:  States will find better places to express himself, not a convenience store  3.  Goal(s):  Reduce agitation to manageable level  Met:  Yes  Target date:  By Discharge   As evidenced by:  Very calm, put effort into not cursing   Attendees: Patient:  [name]  03/15/2011  10:32 AM   Family:     Physician:  Dr. Gwenyth Bouillon Ahluwalia 03/15/2011  10:32 AM   Nursing:   Shann Medal, RN 03/15/2011  10:32 AM   Case Manager:  Ambrose Mantle, LCSW 03/15/2011  10:32 AM     Counselor:  Veto Kemps, MT-BC 03/15/2011  10:32 AM   Other: 03/15/2011  10:32 AM   Other:     Other:     Other:      Scribe for Treatment Team:   Sarina Ser, 03/15/2011, 10:32 AM

## 2011-03-18 NOTE — Progress Notes (Signed)
Patient Discharge Instructions:  Dictated admission note faxed, Date faxed:  11/19 D/C instructions faxed, Date faxed:  11/19 D/C Summary faxed, Date faxed:  11/19 Med. Rec. Form faxed, Date faxed:  11/19  [name], 03/18/2011, 3:32 PM 

## 2011-03-25 DIAGNOSIS — E559 Vitamin D deficiency, unspecified: Secondary | ICD-10-CM | POA: Diagnosis not present

## 2011-03-25 DIAGNOSIS — E7800 Pure hypercholesterolemia, unspecified: Secondary | ICD-10-CM | POA: Diagnosis not present

## 2011-03-25 DIAGNOSIS — J309 Allergic rhinitis, unspecified: Secondary | ICD-10-CM | POA: Diagnosis not present

## 2011-03-25 DIAGNOSIS — F200 Paranoid schizophrenia: Secondary | ICD-10-CM | POA: Diagnosis not present

## 2011-03-25 DIAGNOSIS — F259 Schizoaffective disorder, unspecified: Secondary | ICD-10-CM | POA: Diagnosis not present

## 2011-03-25 DIAGNOSIS — E785 Hyperlipidemia, unspecified: Secondary | ICD-10-CM | POA: Diagnosis not present

## 2011-03-25 DIAGNOSIS — E669 Obesity, unspecified: Secondary | ICD-10-CM | POA: Diagnosis not present

## 2011-06-26 DIAGNOSIS — F200 Paranoid schizophrenia: Secondary | ICD-10-CM | POA: Diagnosis not present

## 2011-07-22 DIAGNOSIS — S92309A Fracture of unspecified metatarsal bone(s), unspecified foot, initial encounter for closed fracture: Secondary | ICD-10-CM | POA: Diagnosis not present

## 2011-07-22 DIAGNOSIS — S93609A Unspecified sprain of unspecified foot, initial encounter: Secondary | ICD-10-CM | POA: Diagnosis not present

## 2011-08-21 DIAGNOSIS — F200 Paranoid schizophrenia: Secondary | ICD-10-CM | POA: Diagnosis not present

## 2011-08-21 DIAGNOSIS — Z79899 Other long term (current) drug therapy: Secondary | ICD-10-CM | POA: Diagnosis not present

## 2011-08-22 DIAGNOSIS — S92309A Fracture of unspecified metatarsal bone(s), unspecified foot, initial encounter for closed fracture: Secondary | ICD-10-CM | POA: Diagnosis not present

## 2011-09-17 DIAGNOSIS — F17200 Nicotine dependence, unspecified, uncomplicated: Secondary | ICD-10-CM | POA: Diagnosis not present

## 2011-09-17 DIAGNOSIS — J209 Acute bronchitis, unspecified: Secondary | ICD-10-CM | POA: Diagnosis not present

## 2011-10-10 DIAGNOSIS — F200 Paranoid schizophrenia: Secondary | ICD-10-CM | POA: Diagnosis not present

## 2012-04-04 DIAGNOSIS — Z23 Encounter for immunization: Secondary | ICD-10-CM | POA: Diagnosis not present

## 2012-04-04 DIAGNOSIS — F2089 Other schizophrenia: Secondary | ICD-10-CM | POA: Diagnosis not present

## 2012-04-04 DIAGNOSIS — L708 Other acne: Secondary | ICD-10-CM | POA: Diagnosis not present

## 2012-05-22 DIAGNOSIS — J069 Acute upper respiratory infection, unspecified: Secondary | ICD-10-CM | POA: Diagnosis not present

## 2012-06-25 DIAGNOSIS — L708 Other acne: Secondary | ICD-10-CM | POA: Diagnosis not present

## 2012-06-25 DIAGNOSIS — L819 Disorder of pigmentation, unspecified: Secondary | ICD-10-CM | POA: Diagnosis not present

## 2012-08-20 DIAGNOSIS — E7800 Pure hypercholesterolemia, unspecified: Secondary | ICD-10-CM | POA: Diagnosis not present

## 2012-08-20 DIAGNOSIS — E785 Hyperlipidemia, unspecified: Secondary | ICD-10-CM | POA: Diagnosis not present

## 2012-08-20 DIAGNOSIS — F2089 Other schizophrenia: Secondary | ICD-10-CM | POA: Diagnosis not present

## 2012-11-27 DIAGNOSIS — F339 Major depressive disorder, recurrent, unspecified: Secondary | ICD-10-CM | POA: Diagnosis not present

## 2012-12-14 DIAGNOSIS — Z125 Encounter for screening for malignant neoplasm of prostate: Secondary | ICD-10-CM | POA: Diagnosis not present

## 2012-12-14 DIAGNOSIS — E781 Pure hyperglyceridemia: Secondary | ICD-10-CM | POA: Diagnosis not present

## 2012-12-14 DIAGNOSIS — N529 Male erectile dysfunction, unspecified: Secondary | ICD-10-CM | POA: Diagnosis not present

## 2012-12-14 DIAGNOSIS — E7800 Pure hypercholesterolemia, unspecified: Secondary | ICD-10-CM | POA: Diagnosis not present

## 2013-02-20 DIAGNOSIS — J069 Acute upper respiratory infection, unspecified: Secondary | ICD-10-CM | POA: Diagnosis not present

## 2013-02-20 DIAGNOSIS — Z125 Encounter for screening for malignant neoplasm of prostate: Secondary | ICD-10-CM | POA: Diagnosis not present

## 2013-02-20 DIAGNOSIS — E781 Pure hyperglyceridemia: Secondary | ICD-10-CM | POA: Diagnosis not present

## 2013-02-20 DIAGNOSIS — E119 Type 2 diabetes mellitus without complications: Secondary | ICD-10-CM | POA: Diagnosis not present

## 2013-05-10 DIAGNOSIS — Z23 Encounter for immunization: Secondary | ICD-10-CM | POA: Diagnosis not present

## 2013-05-27 DIAGNOSIS — G4700 Insomnia, unspecified: Secondary | ICD-10-CM | POA: Diagnosis not present

## 2013-05-27 DIAGNOSIS — H579 Unspecified disorder of eye and adnexa: Secondary | ICD-10-CM | POA: Diagnosis not present

## 2013-06-10 DIAGNOSIS — H1030 Unspecified acute conjunctivitis, unspecified eye: Secondary | ICD-10-CM | POA: Diagnosis not present

## 2013-06-17 DIAGNOSIS — L819 Disorder of pigmentation, unspecified: Secondary | ICD-10-CM | POA: Diagnosis not present

## 2013-06-17 DIAGNOSIS — L708 Other acne: Secondary | ICD-10-CM | POA: Diagnosis not present

## 2013-07-26 ENCOUNTER — Emergency Department (EMERGENCY_DEPARTMENT_HOSPITAL)
Admission: EM | Admit: 2013-07-26 | Discharge: 2013-07-27 | Disposition: A | Discharge status: Other Acute Inpatient Hospital | Payer: Medicare Other | Source: Home / Self Care | Attending: Emergency Medicine | Admitting: Emergency Medicine

## 2013-07-26 ENCOUNTER — Emergency Department (HOSPITAL_COMMUNITY)
Admission: EM | Admit: 2013-07-26 | Discharge: 2013-07-26 | Discharge status: Absent without leave | Payer: Medicare Other | Source: Home / Self Care

## 2013-07-26 DIAGNOSIS — F209 Schizophrenia, unspecified: Secondary | ICD-10-CM

## 2013-07-26 DIAGNOSIS — F259 Schizoaffective disorder, unspecified: Secondary | ICD-10-CM | POA: Diagnosis not present

## 2013-07-26 DIAGNOSIS — F17200 Nicotine dependence, unspecified, uncomplicated: Secondary | ICD-10-CM | POA: Insufficient documentation

## 2013-07-26 DIAGNOSIS — F200 Paranoid schizophrenia: Secondary | ICD-10-CM

## 2013-07-26 DIAGNOSIS — E785 Hyperlipidemia, unspecified: Secondary | ICD-10-CM

## 2013-07-26 DIAGNOSIS — Z79899 Other long term (current) drug therapy: Secondary | ICD-10-CM

## 2013-07-26 DIAGNOSIS — Z91199 Patient's noncompliance with other medical treatment and regimen due to unspecified reason: Secondary | ICD-10-CM | POA: Diagnosis not present

## 2013-07-26 DIAGNOSIS — Z9119 Patient's noncompliance with other medical treatment and regimen: Secondary | ICD-10-CM | POA: Diagnosis not present

## 2013-07-26 HISTORY — DX: Schizophrenia, unspecified: F20.9

## 2013-07-27 ENCOUNTER — Inpatient Hospital Stay (HOSPITAL_COMMUNITY)
Admission: AD | Admit: 2013-07-27 | Discharge: 2013-07-30 | DRG: 885 | Disposition: A | Discharge status: Home / Self Care | Payer: Medicare Other | Source: Intra-hospital | Attending: Psychiatry | Admitting: Psychiatry

## 2013-07-27 ENCOUNTER — Encounter (HOSPITAL_COMMUNITY): Payer: Self-pay | Admitting: Behavioral Health

## 2013-07-27 ENCOUNTER — Encounter (HOSPITAL_COMMUNITY): Payer: Self-pay | Admitting: Emergency Medicine

## 2013-07-27 DIAGNOSIS — F17200 Nicotine dependence, unspecified, uncomplicated: Secondary | ICD-10-CM | POA: Diagnosis present

## 2013-07-27 DIAGNOSIS — Z79899 Other long term (current) drug therapy: Secondary | ICD-10-CM

## 2013-07-27 DIAGNOSIS — Z9119 Patient's noncompliance with other medical treatment and regimen: Secondary | ICD-10-CM

## 2013-07-27 DIAGNOSIS — F259 Schizoaffective disorder, unspecified: Principal | ICD-10-CM | POA: Diagnosis present

## 2013-07-27 DIAGNOSIS — Z91199 Patient's noncompliance with other medical treatment and regimen due to unspecified reason: Secondary | ICD-10-CM

## 2013-07-27 DIAGNOSIS — F200 Paranoid schizophrenia: Secondary | ICD-10-CM

## 2013-07-27 DIAGNOSIS — E785 Hyperlipidemia, unspecified: Secondary | ICD-10-CM | POA: Diagnosis present

## 2013-07-27 DIAGNOSIS — F29 Unspecified psychosis not due to a substance or known physiological condition: Secondary | ICD-10-CM | POA: Diagnosis present

## 2013-07-27 HISTORY — DX: Bipolar disorder, unspecified: F31.9

## 2013-07-27 LAB — RAPID URINE DRUG SCREEN, HOSP PERFORMED
Amphetamines: NOT DETECTED
Barbiturates: NOT DETECTED
Benzodiazepines: NOT DETECTED
Cocaine: NOT DETECTED
Opiates: NOT DETECTED
Tetrahydrocannabinol: NOT DETECTED

## 2013-07-27 LAB — COMPREHENSIVE METABOLIC PANEL
ALT: 30 U/L (ref 0–53)
AST: 34 U/L (ref 0–37)
Albumin: 4.1 g/dL (ref 3.5–5.2)
Alkaline Phosphatase: 75 U/L (ref 39–117)
CO2: 26 mEq/L (ref 19–32)
GFR calc non Af Amer: >90 mL/min (ref >90)
Glucose, Bld: 99 mg/dL (ref 70–99)
Potassium: 3.8 mEq/L (ref 3.7–5.3)
Sodium: 139 mEq/L (ref 137–147)
Total Protein: 7.9 g/dL (ref 6.0–8.3)

## 2013-07-27 LAB — LITHIUM LEVEL: Lithium Lvl: <0.25 mEq/L — ABNORMAL LOW (ref 0.80–1.40)

## 2013-07-27 LAB — CBC WITH DIFFERENTIAL/PLATELET
Basophils Absolute: 0 10*3/uL (ref 0.0–0.1)
Basophils Relative: 0 % (ref 0–1)
Eosinophils Absolute: 0.3 10*3/uL (ref 0.0–0.7)
Eosinophils Relative: 2 % (ref 0–5)
HCT: 42.9 % (ref 39.0–52.0)
Hemoglobin: 14.9 g/dL (ref 13.0–17.0)
Lymphocytes Relative: 27 % (ref 12–46)
Lymphs Abs: 2.9 10*3/uL (ref 0.7–4.0)
MCH: 30.5 pg (ref 26.0–34.0)
MCHC: 34.7 g/dL (ref 30.0–36.0)
MCV: 87.7 fL (ref 78.0–100.0)
Monocytes Absolute: 0.5 K/uL (ref 0.1–1.0)
Monocytes Relative: 4 % (ref 3–12)
Neutro Abs: 7 K/uL (ref 1.7–7.7)
Neutrophils Relative %: 67 % (ref 43–77)
Platelets: 329 10*3/uL (ref 150–400)
RBC: 4.89 MIL/uL (ref 4.22–5.81)
RDW: 12.7 % (ref 11.5–15.5)
WBC: 10.6 K/uL — ABNORMAL HIGH (ref 4.0–10.5)

## 2013-07-27 LAB — COMPREHENSIVE METABOLIC PANEL WITH GFR
BUN: 8 mg/dL (ref 6–23)
Calcium: 9.6 mg/dL (ref 8.4–10.5)
Chloride: 101 meq/L (ref 96–112)
Creatinine, Ser: 1.01 mg/dL (ref 0.50–1.35)
GFR calc Af Amer: >90 mL/min (ref >90)
Total Bilirubin: 0.3 mg/dL (ref 0.3–1.2)

## 2013-07-27 LAB — ETHANOL: Alcohol, Ethyl (B): <11 mg/dL (ref 0–11)

## 2013-07-27 MED ORDER — ALUM & MAG HYDROXIDE-SIMETH 200-200-20 MG/5ML PO SUSP
30.0000 mL | ORAL | Status: DC | PRN
  Administered 2013-07-29 – 2013-08-03 (×4): 30 mL via ORAL

## 2013-07-27 MED ORDER — LITHIUM CARBONATE 300 MG PO CAPS
300.0000 mg | ORAL_CAPSULE | Freq: Three times a day (TID) | ORAL | Status: DC
  Administered 2013-07-28: 300 mg via ORAL
  Filled 2013-07-27 (×5): qty 1

## 2013-07-27 MED ORDER — LORATADINE 10 MG PO TABS
10.0000 mg | ORAL_TABLET | Freq: Every day | ORAL | Status: DC
  Administered 2013-07-28 – 2013-08-04 (×8): 10 mg via ORAL
  Filled 2013-07-27 (×12): qty 1

## 2013-07-27 MED ORDER — ACETAMINOPHEN 325 MG PO TABS
650.0000 mg | ORAL_TABLET | Freq: Four times a day (QID) | ORAL | Status: DC | PRN
  Administered 2013-07-28 – 2013-08-02 (×6): 650 mg via ORAL
  Filled 2013-07-27 (×6): qty 2

## 2013-07-27 MED ORDER — BENZTROPINE MESYLATE 1 MG PO TABS
1.0000 mg | ORAL_TABLET | Freq: Two times a day (BID) | ORAL | Status: DC
  Administered 2013-07-28 – 2013-08-04 (×15): 1 mg via ORAL
  Filled 2013-07-27 (×17): qty 1
  Filled 2013-07-27: qty 6
  Filled 2013-07-27: qty 1
  Filled 2013-07-27: qty 6
  Filled 2013-07-27 (×2): qty 1

## 2013-07-27 MED ORDER — TRAZODONE HCL 100 MG PO TABS
100.0000 mg | ORAL_TABLET | Freq: Every day | ORAL | Status: DC
  Administered 2013-07-27 – 2013-07-28 (×2): 100 mg via ORAL
  Filled 2013-07-27 (×5): qty 1

## 2013-07-27 MED ORDER — ARIPIPRAZOLE 10 MG PO TABS
10.0000 mg | ORAL_TABLET | Freq: Every day | ORAL | Status: DC
  Administered 2013-07-28 – 2013-08-04 (×8): 10 mg via ORAL
  Filled 2013-07-27 (×5): qty 1
  Filled 2013-07-27: qty 3
  Filled 2013-07-27 (×5): qty 1

## 2013-07-27 MED ORDER — ALUM & MAG HYDROXIDE-SIMETH 200-200-20 MG/5ML PO SUSP: 30.0000 mL | ORAL | Status: DC | PRN

## 2013-07-27 MED ORDER — IBUPROFEN 400 MG PO TABS
600.0000 mg | ORAL_TABLET | Freq: Three times a day (TID) | ORAL | Status: DC | PRN
  Administered 2013-07-27: 600 mg via ORAL
  Filled 2013-07-27: qty 2

## 2013-07-27 MED ORDER — BENZTROPINE MESYLATE 1 MG PO TABS
1.0000 mg | ORAL_TABLET | Freq: Two times a day (BID) | ORAL | Status: DC
  Administered 2013-07-27: 1 mg via ORAL
  Filled 2013-07-27 (×2): qty 1

## 2013-07-27 MED ORDER — ATORVASTATIN CALCIUM 20 MG PO TABS
20.0000 mg | ORAL_TABLET | Freq: Every day | ORAL | Status: DC
  Administered 2013-07-28 – 2013-08-04 (×8): 20 mg via ORAL
  Filled 2013-07-27 (×7): qty 1
  Filled 2013-07-27: qty 2
  Filled 2013-07-27 (×3): qty 1

## 2013-07-27 MED ORDER — BUPROPION HCL ER (XL) 300 MG PO TB24
300.0000 mg | ORAL_TABLET | Freq: Every day | ORAL | Status: DC
  Administered 2013-07-28: 300 mg via ORAL
  Filled 2013-07-27 (×3): qty 1

## 2013-07-27 MED ORDER — ONDANSETRON HCL 4 MG PO TABS: 4.0000 mg | ORAL_TABLET | Freq: Three times a day (TID) | ORAL | Status: DC | PRN

## 2013-07-27 MED ORDER — ATORVASTATIN CALCIUM 20 MG PO TABS
20.0000 mg | ORAL_TABLET | Freq: Every day | ORAL | Status: DC
  Administered 2013-07-27: 20 mg via ORAL
  Filled 2013-07-27 (×2): qty 1

## 2013-07-27 MED ORDER — NICOTINE POLACRILEX 2 MG MT GUM
2.0000 mg | CHEWING_GUM | OROMUCOSAL | Status: DC | PRN
  Administered 2013-07-28 – 2013-07-30 (×7): 2 mg via ORAL
  Filled 2013-07-27 (×9): qty 1

## 2013-07-27 MED ORDER — ARIPIPRAZOLE 10 MG PO TABS
10.0000 mg | ORAL_TABLET | Freq: Every day | ORAL | Status: DC
  Administered 2013-07-27: 10 mg via ORAL
  Filled 2013-07-27 (×3): qty 1

## 2013-07-27 MED ORDER — LORATADINE 10 MG PO TABS
10.0000 mg | ORAL_TABLET | Freq: Every day | ORAL | Status: DC
  Administered 2013-07-27: 10 mg via ORAL
  Filled 2013-07-27: qty 1

## 2013-07-27 MED ORDER — INFLUENZA VAC SPLIT QUAD 0.5 ML IM SUSP
0.5000 mL | INTRAMUSCULAR | Status: DC
  Filled 2013-07-27: qty 0.5

## 2013-07-27 MED ORDER — MAGNESIUM HYDROXIDE 400 MG/5ML PO SUSP: 30.0000 mL | Freq: Every day | ORAL | Status: DC | PRN

## 2013-07-27 MED ORDER — LORAZEPAM 1 MG PO TABS: 1.0000 mg | ORAL_TABLET | Freq: Three times a day (TID) | ORAL | Status: DC | PRN

## 2013-07-27 MED ORDER — TRAZODONE HCL 50 MG PO TABS
100.0000 mg | ORAL_TABLET | Freq: Every day | ORAL | Status: DC
  Filled 2013-07-27: qty 2

## 2013-07-27 NOTE — [facility] Group Notes (Signed)
Adult Psychoeducational Group Note  Date:  07/27/2013 Time:  8:35 PM  Group Topic/Focus:  Wrap-Up Group:   The focus of this group is to help patients review their daily goal of treatment and discuss progress on daily workbooks.  Participation Level:  Minimal  Participation Quality:  Appropriate  Affect:  Flat  Cognitive:  Appropriate  Insight: Appropriate  Engagement in Group:  Limited  Modes of Intervention:  Discussion  Additional Comments:  [name] is a new admit.  He did not want to share anything about himself in group.  He was quiet and attentive.  [name], [name] A 07/27/2013, 8:35 PM

## 2013-07-27 NOTE — ED Notes (Signed)
Per pt: "[facility] wanted to send me to [place] and I didn't want to go, I don't know where or what it is". Pt denies SI/HI, command hallucinations or paranoid thoughts.parents are here with him

## 2013-07-27 NOTE — Tx Team (Signed)
Initial Interdisciplinary Treatment Plan  PATIENT STRENGTHS: (choose at least two) Financial means Motivation for treatment/growth Supportive family/friends  PATIENT STRESSORS: Financial difficulties Legal issue Medication change or noncompliance   PROBLEM LIST: Problem List/Patient Goals Date to be addressed Date deferred Reason deferred Estimated date of resolution  "I am just ready to go home." 07/27/2013     "I am a little bit depressed." 07/27/2013     Medication cmpliance 07/27/2013     Auditory halluciantions 07/27/2013                                    DISCHARGE CRITERIA:  Ability to meet basic life and health needs Improved stabilization in mood, thinking, and/or behavior Motivation to continue treatment in a less acute level of care Safe-care adequate arrangements made  PRELIMINARY DISCHARGE PLAN: Attend aftercare/continuing care group Return to previous living arrangement  PATIENT/FAMIILY INVOLVEMENT: This treatment plan has been presented to and reviewed with the patient, [name].  The patient and family have been given the opportunity to ask questions and make suggestions.  Angeline SlimHill, Kineta Fudala M 07/27/2013, 10:12 PM

## 2013-07-27 NOTE — [facility] Counselor (Signed)
[facility] Sts that patient has a ACCT team with [facility] and the contact # is [phone].
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
## 2013-07-27 NOTE — ED Notes (Signed)
[facility] bed 1 by Dr. Jannifer FranklinAkintayo pending IVC papers are in order. [facility] Call report to [phone]

## 2013-07-27 NOTE — ED Notes (Signed)
Woke pt up for telepsych consult.

## 2013-07-27 NOTE — ED Notes (Signed)
[facility]. States she will have Renata CapriceConrad put his note in on the telapsych that was done this morning
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
## 2013-07-27 NOTE — Progress Notes (Signed)
Patient will receive a Tele Psych with Nani SkillernJohn Conrad Withrow, NP

## 2013-07-27 NOTE — Progress Notes (Signed)
D   Pt is guarded but cooperative upon assessment   He was requesting a nicotine patch but readily accepted the gum until the AM when he can get a patch   Pt has been pacing the hall and is fidgety but he interacts appropriately with others A   Verbal support given   Medications administered and effectiveness monitored   Discussed ordered medications with pt   Q 15 min checks R   Pt is safe and verbalizes understanding of medications

## 2013-07-27 NOTE — ED Notes (Addendum)
Interview with pt's mom and stepdad: pt has been excalating in manic behavior over past few weeks. Has lost his car and his car keys 3 separate times, unsure if he's taking medications properly. [facility] had IVC'd him today but papers had not been served because pt abruptly left there. Now pt did arrives here voluntarily.Sheriffs deputy is here now with IVC papers taken out by pt's mom. Pt is being served now. Mom is his POA and will present papers in the morning

## 2013-07-28 ENCOUNTER — Encounter (HOSPITAL_COMMUNITY): Payer: Self-pay | Admitting: Psychiatry

## 2013-07-28 DIAGNOSIS — F259 Schizoaffective disorder, unspecified: Secondary | ICD-10-CM | POA: Diagnosis present

## 2013-07-28 MED ORDER — OLANZAPINE 10 MG PO TBDP
10.0000 mg | ORAL_TABLET | Freq: Three times a day (TID) | ORAL | Status: DC | PRN
Start: 2013-07-28 — End: 2013-08-04
  Administered 2013-07-30 – 2013-08-04 (×7): 10 mg via ORAL
  Filled 2013-07-28 (×2): qty 1
  Filled 2013-07-28: qty 2
  Filled 2013-07-28 (×4): qty 1

## 2013-07-28 MED ORDER — LITHIUM CARBONATE ER 450 MG PO TBCR
450.0000 mg | EXTENDED_RELEASE_TABLET | Freq: Two times a day (BID) | ORAL | Status: DC
  Administered 2013-07-28 – 2013-08-04 (×14): 450 mg via ORAL
  Filled 2013-07-28 (×14): qty 1
  Filled 2013-07-28: qty 6
  Filled 2013-07-28: qty 1
  Filled 2013-07-28: qty 6
  Filled 2013-07-28 (×2): qty 1

## 2013-07-28 MED ORDER — HYDROXYZINE HCL 50 MG PO TABS
50.0000 mg | ORAL_TABLET | Freq: Once | ORAL | Status: AC
  Administered 2013-07-28: 50 mg via ORAL

## 2013-07-28 MED ORDER — HYDROXYZINE HCL 50 MG PO TABS
ORAL_TABLET | ORAL | Status: AC
  Filled 2013-07-28: qty 1

## 2013-07-28 NOTE — [facility] Group Notes (Signed)
[facility]   07/28/2013 10:54 AM  Participation Quality:  Did not attend    [place], [name]

## 2013-07-28 NOTE — [facility] Group Notes (Signed)
[facility]  07/28/2013  11:18 AM  Type of Therapy:  Mental Health Association Presentation   Participation Level:  Minimal  Participation Quality:  Inattentive  Affect:  Flat  Cognitive:  Disorganized  Insight:  Lacking  Engagement in Therapy:  Lacking  Modes of Intervention:  Discussion, Education and Socialization   Summary of Progress/Problems:  Onalee HuaDavid from [facility] [employer] came to present his recovery story and play the guitar.  Arlys JohnBrian listened quietly while the speaker told his story.  Left after 15 minutes and did not return.    Simona Huhina Yang   07/28/2013  11:18 AM
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
## 2013-07-28 NOTE — Progress Notes (Signed)
Patient ID: [name], male   DOB: Sep 23, 1975, 38 y.o.   MRN: [id number] D: patient remains guarded; he is cooperative.  Patient walking down the hall singing and joking around.  He has expressed his need for a cigarette several times.  He is taking the nicorette gum which he states is "not even close to helping.  Do you have a 10 mg?"  Patient is positive for auditory hallucinations.  He denies any SI/HI.  He reports no depressive symptoms.  He is sleeping well; appetite is good and his energy level is "high."  A: continue to monitor medication management and MD orders.  Safety checks continued every 15 minutes per protocol.

## 2013-07-28 NOTE — Progress Notes (Signed)
The focus of this group is to help patients review their daily goal of treatment and discuss progress on daily workbooks. Pt did not attend the evening group. 
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
## 2013-07-28 NOTE — Progress Notes (Signed)
Pt is silly and attention seeking  He was walking down the hall saying he needed something for gas  And was laughing the whole time

## 2013-07-28 NOTE — [facility] Suicide Risk Assessment (Signed)
   Nursing information obtained from:  Patient Demographic factors:  Male;Unemployed;Low socioeconomic status Current Mental Status:  NA Loss Factors:  Financial problems / change in socioeconomic status;Legal issues Historical Factors:  NA Risk Reduction Factors:  Positive social support Total Time spent with patient: 30 minutes  CLINICAL FACTORS:   Severe Anxiety and/or Agitation Bipolar Disorder: manic  Schizophrenia:   Less than 39 years old Paranoid or undifferentiated type Currently Psychotic  Psychiatric Specialty Exam: Physical Exam  Psychiatric: His speech is normal. His affect is labile. He is aggressive and actively hallucinating. Thought content is paranoid and delusional. Cognition and memory are normal. He expresses impulsivity.    Review of Systems  Constitutional: Negative.   HENT: Negative.   Eyes: Negative.   Respiratory: Negative.   Cardiovascular: Negative.   Gastrointestinal: Negative.   Genitourinary: Negative.   Musculoskeletal: Negative.   Skin: Negative.   Neurological: Negative.   Endo/Heme/Allergies: Negative.   Psychiatric/Behavioral: Positive for hallucinations. The patient is nervous/anxious and has insomnia.     Blood pressure 130/81, pulse 95, temperature 98.2 F (36.8 C), temperature source Oral, resp. rate 18, height 6\' 1"  (1.854 m), weight 113.626 kg (250 lb 8 oz), SpO2 97.00%.Body mass index is 33.06 kg/(m^2).  General Appearance: Disheveled  Eye Contact::  Minimal  Speech:  Pressured  Volume:  Normal  Mood:  Irritable  Affect:  Full Range  Thought Process:  Disorganized  Orientation:  Full (Time, Place, and Person)  Thought Content:  Delusions and Hallucinations: Auditory  Suicidal Thoughts:  No  Homicidal Thoughts:  No  Memory:  Immediate;   Fair Recent;   Fair Remote;   Fair  Judgement:  Impaired  Insight:  Lacking  Psychomotor Activity:  Increased  Concentration:  Fair  Recall:  FiservFair  Fund of Knowledge:Fair  Language:  Fair  Akathisia:  No  Handed:  Right  AIMS (if indicated):     Assets:  Communication Skills Desire for Improvement Physical Health  Sleep:  Number of Hours: 2.5   Musculoskeletal: Strength & Muscle Tone: within normal limits Gait & Station: normal Patient leans: N/A  COGNITIVE FEATURES THAT CONTRIBUTE TO RISK:  Closed-mindedness Polarized thinking    SUICIDE RISK:   Minimal: No identifiable suicidal ideation.  Patients presenting with no risk factors but with morbid ruminations; may be classified as minimal risk based on the severity of the depressive symptoms  PLAN OF CARE:1. Admit for crisis management and stabilization. 2. Medication management to reduce current symptoms to base line and improve the   patient's overall level of functioning 3. Treat health problems as indicated. 4. Develop treatment plan to decrease risk of relapse upon discharge and the need for readmission. 5. Psycho-social education regarding relapse prevention and self care. 6. Health care follow up as needed for medical problems. 7. Restart home medications where appropriate.   I certify that inpatient services furnished can reasonably be expected to improve the patient's condition.  Thedore MinsAkintayo, Mikeya Tomasetti, MD 07/28/2013, 9:15 AM
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
## 2013-07-29 DIAGNOSIS — F259 Schizoaffective disorder, unspecified: Principal | ICD-10-CM

## 2013-07-29 MED ORDER — DIPHENHYDRAMINE HCL 50 MG/ML IJ SOLN
50.0000 mg | Freq: Once | INTRAMUSCULAR | Status: AC
Start: 2013-07-29 — End: 2013-07-29
  Administered 2013-07-29: 50 mg via INTRAVENOUS
  Filled 2013-07-29: qty 1

## 2013-07-29 MED ORDER — TRAZODONE HCL 150 MG PO TABS
150.0000 mg | ORAL_TABLET | Freq: Every day | ORAL | Status: DC
  Administered 2013-07-29 – 2013-08-03 (×5): 150 mg via ORAL
  Filled 2013-07-29 (×5): qty 1
  Filled 2013-07-29: qty 3
  Filled 2013-07-29 (×2): qty 1

## 2013-07-29 MED ORDER — ARIPIPRAZOLE ER 400 MG IM SUSR
400.0000 mg | Freq: Once | INTRAMUSCULAR | Status: AC
  Administered 2013-07-29: 400 mg via INTRAMUSCULAR
  Filled 2013-07-29: qty 400

## 2013-07-29 NOTE — Progress Notes (Signed)
Patient ID: James SizerBrian T [name], male   DOB: 23-Aug-1975, 38 y.o.   MRN: [id number] D: Patient visible on the unit; he continues to pace and rap/sing.  He remains hyperverbal in speech; he is fidgety and restless.  Offered patient zyprexa for agitation and he refused stating, "that stuff makes me hear voices. No, don't give me that.  I don't take zyprexa, I take xanax." patient laughs inappropriately at times when nothing is said.  He denies any SI/HI.  He appears to be responding to internal stimuli.  A: continue to monitor medication management and MD orders.  Safety checks completed every 15 minutes per protocol.  R: patient is receptive to staff; he is redirectable.

## 2013-07-29 NOTE — Progress Notes (Signed)
Adult Psychoeducational Group Note  Date:  07/29/2013 Time:  8:00 pm  Group Topic/Focus:  Wrap-Up Group:   The focus of this group is to help patients review their daily goal of treatment and discuss progress on daily workbooks.  Participation Level:  Active  Participation Quality:  Appropriate, Sharing and Supportive  Affect:  Appropriate  Cognitive:  Appropriate  Insight: Appropriate  Engagement in Group:  Engaged  Modes of Intervention:  Discussion, Education, Socialization and Support  Additional Comments:  Pt stated that he had a pretty good day. Pt stated that his step-father and mother came to visit him. Pt stated that he enjoys rapping and listening to rap music. Pt stated that music is therapeutic for him.   Laural BenesJohnson, Cory Kitt 07/29/2013, 8:35 PM

## 2013-07-29 NOTE — Progress Notes (Signed)
[facility]  07/29/2013 10:56 AM [name]  MRN:  [id number] Subjective:   Patient states "I'm pacing just to get exercise. I also enjoy rapping. I'm just not sleeping well. I'm feeling a little hyper. I was taking my Lithium but somebody told me it's ok to skip a few doses. I have a lot of ideas. I want to start making music and clothes. I want to do a lot of things."   Objective:  Patient is visible on the unit. He is pleasant during interactions with staff. The patient remains manic with pressured speech, flight of ideas, decreased need for sleep, and inflated self esteem. [name] is extremely distractible during his follow up assessment needing redirection to focus on his psychiatric symptoms. Patient also talks about his need to rap, making music, and designing clothes. [name] is very hyper-verbal today. He is complaint with medications and has not verbalized adverse effects. The patient takes regular injections of Abilify Maintena and is reported to be due today.   Diagnosis:   DSM5:  Total Time spent with patient: 30 minutes  AXIS I: Schizoaffective disorder, bipolar type  AXIS II: Deferred  AXIS III:  Past Medical History   Diagnosis  Date   .  Paranoid schizophrenia    .  Hyperlipidemia    .  Schizophrenia    .  Bipolar disorder     AXIS IV: other psychosocial or environmental problems, problems related to social environment and problems with primary support group  AXIS V: 31-40 Serious Symptoms  ADL's:  Intact  Sleep: Poor  Appetite:  Fair  Suicidal Ideation:  Denies Homicidal Ideation:  Denies AEB (as evidenced by):  Psychiatric Specialty Exam: Physical Exam  Review of Systems  Constitutional: Negative.   HENT: Negative.   Eyes: Negative.   Respiratory: Negative.   Cardiovascular: Negative.   Gastrointestinal: Negative.   Genitourinary: Negative.   Musculoskeletal: Negative.   Skin: Negative.   Neurological: Negative.   Endo/Heme/Allergies:  Negative.   Psychiatric/Behavioral: Positive for hallucinations. The patient is nervous/anxious and has insomnia.     Blood pressure 110/64, pulse 101, temperature 98.1 F (36.7 C), temperature source Oral, resp. rate 20, height 6\' 1"  (1.854 m), weight 113.626 kg (250 lb 8 oz), SpO2 97.00%.Body mass index is 33.06 kg/(m^2).  General Appearance: Casual  Eye Contact::  Good  Speech:  Pressured  Volume:  Normal  Mood:  Euphoric  Affect:  Full Range  Thought Process:  Circumstantial  Orientation:  Full (Time, Place, and Person)  Thought Content:  Delusions and Hallucinations: Auditory  Suicidal Thoughts:  No  Homicidal Thoughts:  No  Memory:  Immediate;   Fair Recent;   Fair Remote;   Fair  Judgement:  Impaired  Insight:  Lacking  Psychomotor Activity:  Increased and Restlessness  Concentration:  Fair  Recall:  [employer]  Fund of Knowledge:Fair  Language: Fair  Akathisia:  No  Handed:  Right  AIMS (if indicated):     Assets:  Communication Skills Desire for Improvement Physical Health  Sleep:  Number of Hours: 1.75   Musculoskeletal: Strength & Muscle Tone: within normal limits Gait & Station: normal Patient leans: N/A  Current Medications: Current Facility-Administered Medications  Medication Dose Route Frequency Provider Last Rate Last Dose  . acetaminophen (TYLENOL) tablet 650 mg  650 mg Oral Q6H PRN Kerry HoughSpencer E Simon, PA-C   650 mg at 07/28/13 [id number]  . alum & mag hydroxide-simeth (MAALOX/MYLANTA) 200-200-20 MG/5ML suspension 30 mL  30 mL  Oral Q4H PRN Kerry Hough, PA-C   30 mL at 07/29/13 1012  . ARIPiprazole (ABILIFY) tablet 10 mg  10 mg Oral Daily Kerry Hough, PA-C   10 mg at 07/29/13 0753  . atorvastatin (LIPITOR) tablet 20 mg  20 mg Oral Daily Kerry Hough, PA-C   20 mg at 07/29/13 0753  . benztropine (COGENTIN) tablet 1 mg  1 mg Oral BID Kerry Hough, PA-C   1 mg at 07/29/13 1610  . influenza vac split quadrivalent PF (FLUARIX) injection 0.5 mL  0.5 mL  Intramuscular Tomorrow-1000 Markitta Ausburn      . lithium carbonate (ESKALITH) CR tablet 450 mg  450 mg Oral BID PC Journey Castonguay   450 mg at 07/29/13 0753  . loratadine (CLARITIN) tablet 10 mg  10 mg Oral Daily Kerry Hough, PA-C   10 mg at 07/29/13 0753  . magnesium hydroxide (MILK OF MAGNESIA) suspension 30 mL  30 mL Oral Daily PRN Kerry Hough, PA-C      . nicotine polacrilex (NICORETTE) gum 2 mg  2 mg Oral PRN Avinash Maltos   2 mg at 07/29/13 0322  . OLANZapine zydis (ZYPREXA) disintegrating tablet 10 mg  10 mg Oral Q8H PRN Federico Maiorino      . traZODone (DESYREL) tablet 150 mg  150 mg Oral QHS Anaysha Andre        Lab Results: No results found for this or any previous visit (from the past 48 hour(s)).  Physical Findings: AIMS: Facial and Oral Movements Muscles of Facial Expression: None, normal Lips and Perioral Area: None, normal Jaw: None, normal Tongue: None, normal,Extremity Movements Upper (arms, wrists, hands, fingers): None, normal Lower (legs, knees, ankles, toes): None, normal, Trunk Movements Neck, shoulders, hips: None, normal, Overall Severity Severity of abnormal movements (highest score from questions above): None, normal Incapacitation due to abnormal movements: None, normal, Dental Status Current problems with teeth and/or dentures?: No Does patient usually wear dentures?: No  CIWA:    COWS:     Treatment Plan Summary: Daily contact with patient to assess and evaluate symptoms and progress in treatment Medication management  Plan: 1. Continue crisis management and stabilization.  2. Medication management: Reviewed with patient who stated no untoward effects. Continue Abilify 10 mg daily for psychosis, Lithium CR 450 mg BID for improved mood stability, and increase Trazodone to 150 mg hs for insomnia. Give Abilify Maintena 400 mg IM today along with Benadryl 50 mg IM for EPS prevention.  3. Encouraged patient to attend groups and participate in  group counseling sessions and activities.  4. Discharge plan in progress.  5. Continue current treatment plan.  6. Address health issues: Vitals reviewed and stable.   Medical Decision Making Problem Points:  Established problem, worsening (2), Review of last therapy session (1) and Review of psycho-social stressors (1) Data Points:  Review of medication regiment & side effects (2) Review of new medications or change in dosage (2)  I certify that inpatient services furnished can reasonably be expected to improve the patient's condition.   Fransisca Kaufmann NP-C 07/29/2013, 10:56 AM Patient seen, evaluated and I agree with notes by Nurse Practitioner. Thedore Mins, MD

## 2013-07-29 NOTE — Progress Notes (Signed)
D: Pt has been anxious tonight, pacing the hallways. He is pleasant and cooperates well with staff. He did not attend group tonight. He denies SI/HI/AVH.  A: Support given. Verbalization encouraged. Medications given as prescribed. Pt encouraged to come to staff with any concerns. R: Pt is receptive. No complaints of pain or discomfort at this time. Q15 min safety checks maintained. Pt remains safe on the unit. Will continue to monitor.

## 2013-07-29 NOTE — Consult Note (Signed)
Agree with recommendation

## 2013-07-29 NOTE — [facility] Group Notes (Signed)
[facility] Group Notes:  (Nursing/MHT/Case Management/Adjunct)  Date:  07/29/2013  Time: 0900am  Type of Therapy:  Psychoeducational Skills  Participation Level:  Did Not Attend   [name], [name] 07/29/2013, 2:48 PM

## 2013-07-29 NOTE — Progress Notes (Signed)
D: Patient in the dayroom interacting with peers on approach.  Patient disorganized and has flight of idea.  Patient paces the hallway and appears to be responding to internal stimuli even though he denies.  Patient denies SI/HI and denies AVH.  Patient states he has been raping today.  Patient states he was having pain earlier today but states it has now subsided.  Patient also blaming his mother stating, "she gets on my nerves and she is always in my business."  Patient denies SI/HI. A: Staff to monitor Q 15 mins for safety.  Encouragement and support offered.  Scheduled medications administered per orders. R: Patient remains safe on the unit.  Patient attended group tonight.  Patient visible on the unit and interacting with peers.  Patient taking administered medications.

## 2013-07-29 NOTE — [facility] Group Notes (Signed)
[facility] Group Notes:  (Counselor/Nursing/MHT/Case Management/Adjunct)  07/29/2013 1:15PM  Type of Therapy:  Group Therapy  Participation Level:  Did not attend    Modes of Intervention:  Discussion, Exploration and Socialization  Summary of Progress/Problems: The topic for group was balance in life.  Pt participated in the discussion about when their life was in balance and out of balance and how this feels.  Pt discussed ways to get back in balance and short term goals they can work on to get where they want to be.    Daryel Geraldorth, Adrian Specht B 07/29/2013 1:34 PM

## 2013-07-30 MED ORDER — NICOTINE 21 MG/24HR TD PT24
21.0000 mg | MEDICATED_PATCH | Freq: Every day | TRANSDERMAL | Status: DC
  Administered 2013-07-30 – 2013-08-04 (×6): 21 mg via TRANSDERMAL
  Filled 2013-07-30 (×8): qty 1

## 2013-07-30 NOTE — Care Management Utilization Note (Signed)
   Per State Regulation 482.30  This chart was reviewed for necessity with respect to the patient's Admission/ Duration of stay.  Next review date: 08/03/13  Monice Lundy Morrison RN, BSN 

## 2013-07-30 NOTE — [facility] Suicide Risk Assessment (Signed)
[facility] INPATIENT:  Family/Significant Other Suicide Prevention Education  Suicide Prevention Education:  Education Completed; No one has been identified by the patient as the family member/significant other with whom the patient will be residing, and identified as the person(s) who will aid the patient in the event of a mental health crisis (suicidal ideations/suicide attempt).  With written consent from the patient, the family member/significant other has been provided the following suicide prevention education, prior to the and/or following the discharge of the patient.  The suicide prevention education provided includes the following:  Suicide risk factors  Suicide prevention and interventions  National Suicide Hotline telephone number  [facility] assessment telephone number  [facility]  [facility] and/or Residential [facility] telephone number  Request made of family/significant other to:  Remove weapons (e.g., guns, rifles, knives), all items previously/currently identified as safety concern.    Remove drugs/medications (over-the-counter, prescriptions, illicit drugs), all items previously/currently identified as a safety concern.  The family member/significant other verbalizes understanding of the suicide prevention education information provided.  The family member/significant other agrees to remove the items of safety concern listed above. The patient did not endorse SI at the time of admission, nor did the patient c/o SI during the stay here.  SPE not required.   Daryel Geraldorth, Kailei Cowens B 07/30/2013, 2:28 PM

## 2013-07-30 NOTE — Progress Notes (Signed)
Patient ID: [name], male   DOB: 1975-06-29, 38 y.o.   MRN: [id number] 07-30-13 @ 1346 nursing shift note: D: pt still complains of mania. He denies any si/hi/av presently. A: He is coming to the medication window for med's.  he is not having any adverse effects from his medications. He got tylenol prn for mild back pain and asked RN to d/c his nicotine gum and get him a nicotine patch. R: RN d/c'd the gum and got him the nicotine patch and applied it. He also stated that when he get angy he likes to rap and he stated he has a hx of nightmares. RN will monitor and Q 15 min checks continue.

## 2013-07-30 NOTE — [facility] Group Notes (Signed)
Adult Psychoeducational Group Note  Date:  07/30/2013 Time:  8:40 PM  Group Topic/Focus:  Wrap-Up Group:   The focus of this group is to help patients review their daily goal of treatment and discuss progress on daily workbooks.  Participation Level:  Active  Participation Quality:  Redirectable  Affect:  Appropriate  Cognitive:  Lacking  Insight: Limited  Engagement in Group:  Engaged  Modes of Intervention:  Discussion  Additional Comments:  [name] stated that his day was well.  He went outside and played basketball for exercise.  He stated that he has discharges plans in place with the ACT team and [facility].  He also expressed that he has a meeting with his lawyer on Monday.  [name] would get off topic at times and have conversations with peers during group but he was redirectable.  [name], Latoria Dry A 07/30/2013, 8:40 PM

## 2013-07-30 NOTE — [facility] Group Notes (Signed)
[facility]   07/30/2013 9:40 AM  Participation Quality:  Engaged  Mood/Affect:  Appropriate  Depression Rating:  denies  Anxiety Rating:  denies  Thoughts of Suicide:  No Will you contract for safety?   NA  Current AVH:  No  Plan for Discharge/Comments:  Another pt was talking about firing her ACT team, and [name] immediately wanted to know how he could go about doing that as well.  I pointed out that if he truly wanted to become his own payee, jettisoning his ACT team right before his hearing was probably not a good idea. He was not convinced.   Limited insight, poor judgement.  Transportation Means: Parent  Supports: parent, ACT team  [name], [name]

## 2013-07-30 NOTE — Progress Notes (Signed)
Patient ID: [name], male   DOB: [birth year], 38 y.o.   MRN: [id number] [facility]  07/30/2013 12:17 PM [name]  MRN:  [id number] Subjective:   Patient states "I am still feeling manic and I rap when I am angry.'' pacing just to get exercise. I also enjoy rapping. I'm just not sleeping well. I'm feeling a little hyper. I was taking my Lithium but somebody told me it's ok to skip a few doses. I have a lot of ideas. I want to start making music and clothes. I want to do a lot of things."   Objective:  Patient seen and chart is reviewed. Patient endorsed decreased auditory hallucinations, racing thoughts, mood swings but he remains manic with pressured speech, flight of ideas, decreased need for sleep, and inflated self esteem. Patient is fidgety, restless, paces the hallway up and down, he also gets easily distracted and has poor concentration. Patient  talks about his need to rap, making music and designing clothes in order to keep himself away from trouble. He is complaint with medications and has not verbalized adverse effects. Patient received a dose of  Abilify Maintena injection 400mg  yesterday with no adverse reactions.   Diagnosis:   DSM5:  Total Time spent with patient: 20 minutes  AXIS I: Schizoaffective disorder, bipolar type  AXIS II: Deferred  AXIS III:  Past Medical History   Diagnosis  Date   .  Hyperlipidemia     AXIS IV: other psychosocial or environmental problems, problems related to social environment and problems with primary support group  AXIS V: 31-40 Serious Symptoms  ADL's:  Intact  Sleep: Poor  Appetite:  Fair  Suicidal Ideation:  Denies Homicidal Ideation:  Denies AEB (as evidenced by):  Psychiatric Specialty Exam: Physical Exam  Review of Systems  Constitutional: Negative.   HENT: Negative.   Eyes: Negative.   Respiratory: Negative.   Cardiovascular: Negative.   Gastrointestinal: Negative.   Genitourinary: Negative.    Musculoskeletal: Negative.   Skin: Negative.   Neurological: Negative.   Endo/Heme/Allergies: Negative.   Psychiatric/Behavioral: Positive for hallucinations. The patient is nervous/anxious and has insomnia.     Blood pressure 113/76, pulse 111, temperature 97.3 F (36.3 C), temperature source Oral, resp. rate 20, height 6\' 1"  (1.854 m), weight 113.626 kg (250 lb 8 oz), SpO2 97.00%.Body mass index is 33.06 kg/(m^2).  General Appearance: Casual  Eye Contact::  Good  Speech:  Pressured  Volume:  Normal  Mood:  Euphoric  Affect:  Full Range  Thought Process:  Circumstantial  Orientation:  Full (Time, Place, and Person)  Thought Content:  Delusions and Hallucinations: Auditory  Suicidal Thoughts:  No  Homicidal Thoughts:  No  Memory:  Immediate;   Fair Recent;   Fair Remote;   Fair  Judgement:  Impaired  Insight:  Lacking  Psychomotor Activity:  Increased and Restlessness  Concentration:  Fair  Recall:  [employer]  Fund of Knowledge:Fair  Language: Fair  Akathisia:  No  Handed:  Right  AIMS (if indicated):     Assets:  Communication Skills Desire for Improvement Physical Health  Sleep:  Number of Hours: 4.75   Musculoskeletal: Strength & Muscle Tone: within normal limits Gait & Station: normal Patient leans: N/A  Current Medications: Current Facility-Administered Medications  Medication Dose Route Frequency Provider Last Rate Last Dose  . acetaminophen (TYLENOL) tablet 650 mg  650 mg Oral Q6H PRN Kerry HoughSpencer E Simon, PA-C   650 mg at 07/29/13 1312  .  alum & mag hydroxide-simeth (MAALOX/MYLANTA) 200-200-20 MG/5ML suspension 30 mL  30 mL Oral Q4H PRN Kerry Hough, PA-C   30 mL at 07/29/13 1012  . ARIPiprazole (ABILIFY) tablet 10 mg  10 mg Oral Daily Kerry Hough, PA-C   10 mg at 07/30/13 0746  . atorvastatin (LIPITOR) tablet 20 mg  20 mg Oral Daily Kerry Hough, PA-C   20 mg at 07/30/13 0746  . benztropine (COGENTIN) tablet 1 mg  1 mg Oral BID Kerry Hough, PA-C   1 mg  at 07/30/13 0746  . influenza vac split quadrivalent PF (FLUARIX) injection 0.5 mL  0.5 mL Intramuscular Tomorrow-1000 Reznor Ferrando      . lithium carbonate (ESKALITH) CR tablet 450 mg  450 mg Oral BID PC Calena Salem   450 mg at 07/30/13 0746  . loratadine (CLARITIN) tablet 10 mg  10 mg Oral Daily Kerry Hough, PA-C   10 mg at 07/30/13 0746  . magnesium hydroxide (MILK OF MAGNESIA) suspension 30 mL  30 mL Oral Daily PRN Kerry Hough, PA-C      . nicotine (NICODERM CQ - dosed in mg/24 hours) patch 21 mg  21 mg Transdermal Daily Haileigh Pitz   21 mg at 07/30/13 0941  . OLANZapine zydis (ZYPREXA) disintegrating tablet 10 mg  10 mg Oral Q8H PRN Akiba Melfi   10 mg at 07/30/13 0151  . traZODone (DESYREL) tablet 150 mg  150 mg Oral QHS Natarsha Hurwitz   150 mg at 07/29/13 2117    Lab Results: No results found for this or any previous visit (from the past 48 hour(s)).  Physical Findings: AIMS: Facial and Oral Movements Muscles of Facial Expression: None, normal Lips and Perioral Area: None, normal Jaw: None, normal Tongue: None, normal,Extremity Movements Upper (arms, wrists, hands, fingers): None, normal Lower (legs, knees, ankles, toes): None, normal, Trunk Movements Neck, shoulders, hips: None, normal, Overall Severity Severity of abnormal movements (highest score from questions above): None, normal Incapacitation due to abnormal movements: None, normal, Dental Status Current problems with teeth and/or dentures?: No Does patient usually wear dentures?: No  CIWA:    COWS:     Treatment Plan Summary: Daily contact with patient to assess and evaluate symptoms and progress in treatment Medication management  Plan: 1. Continue crisis management and stabilization.  2. Medication management: Reviewed with patient who stated no untoward effects. - Continue Abilify 10 mg daily for psychosis. -Continue Lithium CR 450 mg BID for improved mood stability. -Continue Trazodone   150 mg hs for insomnia.  -Received  Abilify Maintena 400 mg IM on 07/29/13.  3. Encouraged patient to attend groups and participate in group counseling sessions and activities.  4. Discharge plan in progress.  5. Continue current treatment plan.  6. Address health issues: Vitals reviewed and stable.  7. Lithium level on 08/02/13. Medical Decision Making Problem Points:  Established problem, improving (1), Review of last therapy session (1) and Review of psycho-social stressors (1) Data Points:  Review of medication regiment & side effects (2) Review of new medications or change in dosage (2)  I certify that inpatient services furnished can reasonably be expected to improve the patient's condition.   Thedore Mins, MD 07/30/2013, 12:17 PM

## 2013-07-30 NOTE — Progress Notes (Addendum)
D: Pt up pacing the hallway. Pt stated that he is unable to sleep and that he thought it was about 5:00 am. Writer offered pt prn med Zyprexa and pt refused. Pt stated that he tried an experimental drug, "[facility]" [facility] and it made him feel like he had bugs crawling on his t-shirt. A: Medications reviewed and prn meds offered. Verbal support given. 15 minute checks performed for safety. R: Pt safety maintained.   Writer offered pt to take Zyprexa at 0150 for the second time and pt willingly took med.   Writer observed pt at 0233, pt asleep at this time.   Pt verbalized to writer this am that "Zyprexa" helped him to sleep.

## 2013-07-31 DIAGNOSIS — F259 Schizoaffective disorder, unspecified: Secondary | ICD-10-CM | POA: Diagnosis not present

## 2013-07-31 NOTE — Progress Notes (Signed)
Patient ID: [name], male   DOB: 03-16-1976, 38 y.o.   MRN: [id number] 07-31-13 @ [phone]  Nursing shift note: D: pt has been visible in the milieu. He has been going to groups and it was reported by the leader in the group that the patient stated the " TV is talking to him". Pt denied any si/hi and remains delusional and is having auditory hallucinations. He is taking his medication and no showing any adverse effects. A: staff continues to reorient this patient and be supportive. R: on his inventory sheet he wrote: requested sleep med, appetite good, attention improving with his depression and hopeless both a 0. No w/d symptoms. He is having some mild back pain and is taking tylenol prn. RN will monitor and Q 15 min ck's continue.

## 2013-07-31 NOTE — Progress Notes (Signed)
Patient ID: James SizerBrian T [name], male   DOB: 1975/07/16, 38 y.o.   MRN: [id number] Psychoeducational Group Note  Date:  07/31/2013 Time:0930am  Group Topic/Focus:  Identifying Needs:   The focus of this group is to help patients identify their personal needs that have been historically problematic and identify healthy behaviors to address their needs.  Participation Level:  Active  Participation Quality:  Appropriate  Affect:  Appropriate  Cognitive:  Appropriate  Insight:  Supportive  Engagement in Group:  Supportive  Additional Comments:  Healthy coping skills.   Valente DavidWeaver, Cletus Paris Brooks 07/31/2013,12:58 PM

## 2013-07-31 NOTE — Progress Notes (Signed)
The focus of this group is to help patients review their daily goal of treatment and discuss progress on daily workbooks. Pt attended the evening group session and responded to all discussion prompts from the Writer. Pt shared that he had a good day on the unit, the highlight of which was a visit from several family members. He reported having no additional needs from Nursing Staff this evening. Pt stated his long term goal was to be well enough to return to school and pursue a music career. [name] affect was appropriate.

## 2013-07-31 NOTE — Progress Notes (Signed)
Patient ID: Nichola SizerBrian T [place]in, male   DOB: [birth year], 38 y.o.   MRN: [id number] Psychoeducational Group Note  Date:  07/31/2013 Time:0910am  Group Topic/Focus:  Identifying Needs:   The focus of this group is to help patients identify their personal needs that have been historically problematic and identify healthy behaviors to address their needs.  Participation Level:  Active  Participation Quality:  Appropriate  Affect:  Appropriate  Cognitive:  Appropriate  Insight:  Supportive  Engagement in Group:  Supportive  Additional Comments:  Inventory group   Valente DavidWeaver, Lovette Merta Brooks 07/31/2013,12:57 PM

## 2013-07-31 NOTE — [facility] Group Notes (Signed)
[facility] LCSW Group Therapy  07/31/2013 12:54 PM  Type of Therapy:  Group Therapy  Participation Level:  Minimal  Participation Quality:  Resistant  Affect:  Irritable  Cognitive:  Alert and Oriented  Insight:  Limited  Engagement in Therapy:  Developing/Improving  Modes of Intervention:  Discussion, Education, Exploration, Rapport Building, Socialization and Support  Summary of Progress/Problems: Pt reported he did not need to come to group, however joined about 15min into group.  Pt introduced himself and identified a coping skill when instructed, however did not participate otherwise.   Seabron SpatesVaughn, Kamira Mellette Anne 07/31/2013, 12:54 PM

## 2013-07-31 NOTE — Progress Notes (Signed)
Patient ID: [name], male   DOB: [birth year], 38 y.o.   MRN: [id number] [facility]  07/31/2013 10:26 AM [name]  MRN:  [id number] Subjective:   Patient states "I feel so much better. I'm not hearing any voices or seeing anything. I feel like the medicines are really working. The only thing is, I really need to get my cell phone because I took some pictures and I wish I had a video... The clouds turned themselves into an ET face in the sky the other day, then no joke... Oneita KrasYoda was in the clouds with a light saber. I know that sounds crazy, but I got proof on my phone if you let me get it".   Objective:  Patient seen and chart is reviewed. Patient endorsed the absence of auditory hallucinations, racing thoughts, mood swings. Pt is calm, cooperative, answers questions appropriately, and is in NAD. Pt presents as non-delusional in guided conversation, yet when asked open-ended questions, pt beings to elaborate about delusions such as faces in clouds and other random topics. Pt is compliant with medications with no adverse reactions. Reports good sleep and appetite as well. Denies SI/HI.   Diagnosis:   DSM5:  Total Time spent with patient: 20 minutes  AXIS I: Schizoaffective disorder, bipolar type  AXIS II: Deferred  AXIS III:  Past Medical History   Diagnosis  Date   .  Hyperlipidemia     AXIS IV: other psychosocial or environmental problems, problems related to social environment and problems with primary support group  AXIS V: 31-40 Serious Symptoms  ADL's:  Intact  Sleep: Poor  Appetite:  Fair  Suicidal Ideation:  Denies Homicidal Ideation:  Denies AEB (as evidenced by):  Psychiatric Specialty Exam: Physical Exam  Review of Systems  Constitutional: Negative.   HENT: Negative.   Eyes: Negative.   Respiratory: Negative.   Cardiovascular: Negative.   Gastrointestinal: Negative.   Genitourinary: Negative.   Musculoskeletal: Negative.   Skin: Negative.    Neurological: Negative.   Endo/Heme/Allergies: Negative.   Psychiatric/Behavioral: Positive for hallucinations. The patient is nervous/anxious and has insomnia.     Blood pressure 140/72, pulse 101, temperature 98 F (36.7 C), temperature source Oral, resp. rate 18, height 6\' 1"  (1.854 m), weight 113.626 kg (250 lb 8 oz), SpO2 97.00%.Body mass index is 33.06 kg/(m^2).  General Appearance: Casual  Eye Contact::  Good  Speech:  Pressured  Volume:  Normal  Mood:  Euphoric  Affect:  Full Range  Thought Process:  Circumstantial  Orientation:  Full (Time, Place, and Person)  Thought Content:  Delusions and Hallucinations: Auditory  Suicidal Thoughts:  No  Homicidal Thoughts:  No  Memory:  Immediate;   Fair Recent;   Fair Remote;   Fair  Judgement:  Impaired  Insight:  Lacking  Psychomotor Activity:  Increased and Restlessness  Concentration:  Fair  Recall:  [employer]Fair  Fund of Knowledge:Fair  Language: Fair  Akathisia:  No  Handed:  Right  AIMS (if indicated):     Assets:  Communication Skills Desire for Improvement Physical Health  Sleep:  Number of Hours: 5   Musculoskeletal: Strength & Muscle Tone: within normal limits Gait & Station: normal Patient leans: N/A  Current Medications: Current Facility-Administered Medications  Medication Dose Route Frequency Provider Last Rate Last Dose  . acetaminophen (TYLENOL) tablet 650 mg  650 mg Oral Q6H PRN Kerry HoughSpencer E Simon, PA-C   650 mg at 07/31/13 [id number]  . alum & mag hydroxide-simeth (MAALOX/MYLANTA)  200-200-20 MG/5ML suspension 30 mL  30 mL Oral Q4H PRN Kerry Hough, PA-C   30 mL at 07/29/13 1012  . ARIPiprazole (ABILIFY) tablet 10 mg  10 mg Oral Daily Kerry Hough, PA-C   10 mg at 07/31/13 1610  . atorvastatin (LIPITOR) tablet 20 mg  20 mg Oral Daily Kerry Hough, PA-C   20 mg at 07/31/13 9604  . benztropine (COGENTIN) tablet 1 mg  1 mg Oral BID Kerry Hough, PA-C   1 mg at 07/31/13 5409  . influenza vac split quadrivalent  PF (FLUARIX) injection 0.5 mL  0.5 mL Intramuscular Tomorrow-1000 Mojeed Akintayo      . lithium carbonate (ESKALITH) CR tablet 450 mg  450 mg Oral BID PC Mojeed Akintayo   450 mg at 07/31/13 0742  . loratadine (CLARITIN) tablet 10 mg  10 mg Oral Daily Kerry Hough, PA-C   10 mg at 07/31/13 8119  . magnesium hydroxide (MILK OF MAGNESIA) suspension 30 mL  30 mL Oral Daily PRN Kerry Hough, PA-C      . nicotine (NICODERM CQ - dosed in mg/24 hours) patch 21 mg  21 mg Transdermal Daily Mojeed Akintayo   21 mg at 07/31/13 0741  . OLANZapine zydis (ZYPREXA) disintegrating tablet 10 mg  10 mg Oral Q8H PRN Mojeed Akintayo   10 mg at 07/30/13 2302  . traZODone (DESYREL) tablet 150 mg  150 mg Oral QHS Mojeed Akintayo   150 mg at 07/30/13 2107    Lab Results: No results found for this or any previous visit (from the past 48 hour(s)).  Physical Findings: AIMS: Facial and Oral Movements Muscles of Facial Expression: None, normal Lips and Perioral Area: None, normal Jaw: None, normal Tongue: None, normal,Extremity Movements Upper (arms, wrists, hands, fingers): None, normal Lower (legs, knees, ankles, toes): None, normal, Trunk Movements Neck, shoulders, hips: None, normal, Overall Severity Severity of abnormal movements (highest score from questions above): None, normal Incapacitation due to abnormal movements: None, normal, Dental Status Current problems with teeth and/or dentures?: No Does patient usually wear dentures?: No  CIWA:    COWS:     Treatment Plan Summary: Daily contact with patient to assess and evaluate symptoms and progress in treatment Medication management  Plan: 1. Continue crisis management and stabilization.  2. Medication management: Reviewed with patient who stated no untoward effects.  -Continue Abilify 10 mg daily for psychosis. -Continue Lithium CR 450 mg BID for improved mood stability. -Continue Trazodone  150 mg hs for insomnia.  -Received  Abilify Maintena  400 mg IM on 07/29/13.   3. Encouraged patient to attend groups and participate in group counseling sessions and activities.  4. Discharge plan in progress.  5. Continue current treatment plan.  6. Address health issues: Vitals reviewed and stable.  7. Lithium level on 08/02/13.  Medical Decision Making Problem Points:  Established problem, improving (1), Review of last therapy session (1) and Review of psycho-social stressors (1) Data Points:  Review of medication regiment & side effects (2) Review of new medications or change in dosage (2)  I certify that inpatient services furnished can reasonably be expected to improve the patient's condition.   Beau Fanny, FNP-BC 07/31/2013, 10:26 AM Agree with assessment and plan Madie Reno A. [place].D.

## 2013-07-31 NOTE — Progress Notes (Signed)
Patient ID: [name], male   DOB: September 28, 1975, 38 y.o.   MRN: [id number] D. The patient was in a good mood this evening. At times seemed elated. Very social and interacting in the milieu. He was somewhat intrusive at times. Flight of ideas. Stated that he had a good day because he was able to go outside and play basketball which released a lot of his pent up energy. A. Met with patient to assess. Encouraged to attend evening group. Needed to be re-directed several times for being intrusive and making inappropriate remarks. R. The patient denied any auditory hallucinations at this time.  Responds positively to redirection. Attended wrap up group, but had difficulty staying on topic. Compliant with medication.

## 2013-07-31 NOTE — Progress Notes (Signed)
Patient ID: [name], male   DOB: [birth year], 38 y.o.   MRN: [id number] D. The patient is pleasant and interacting in the milieu. He has some flight of ideas and grandiosity when he speaks. His speech is still a little pressured. Observed pacing the hall all evening. Stated he was working off his stress so that he could sleep better. A. Encouraged patient to attend evening group. Met with patient to assess. Reviewed and administered HS sleep medication.  R. The patient attended and participated in evening group. Compliant with medication. Denied a/v hallucinations at present.

## 2013-08-01 DIAGNOSIS — F259 Schizoaffective disorder, unspecified: Secondary | ICD-10-CM | POA: Diagnosis not present

## 2013-08-01 NOTE — Progress Notes (Signed)
Adult Psychoeducational Group Note  Date:  08/01/2013 Time:  9:20 PM  Group Topic/Focus:  Wrap-Up Group:   The focus of this group is to help patients review their daily goal of treatment and discuss progress on daily workbooks.  Participation Level:  Minimal  Participation Quality:  Inattentive  Affect:  Appropriate  Cognitive:  Appropriate  Insight: Lacking  Engagement in Group:  Lacking  Modes of Intervention:  Discussion  Additional Comment   The patient expressed that he listens to music as a coping skill.The patient didn't seem interested in group.  Octavio Mannshigpen, Steffen Hase Lee 08/01/2013, 9:20 PM

## 2013-08-01 NOTE — Progress Notes (Signed)
Patient ID: [name], male   DOB: 06/28/1975, 38 y.o.   MRN: [id number] Psychoeducational Group Note  Date:  08/01/2013 Time:  0930am  Group Topic/Focus:  Making Healthy Choices:   The focus of this group is to help patients identify negative/unhealthy choices they were using prior to admission and identify positive/healthier coping strategies to replace them upon discharge.  Participation Level:  Active  Participation Quality:  Appropriate  Affect:  Anxious  Cognitive:  Appropriate  Insight:  Supportive  Engagement in Group:  Supportive  Additional Comments:  Healthy support group   [name], [name] 08/01/2013,11:10 AM

## 2013-08-01 NOTE — Progress Notes (Signed)
Patient ID: [name], male   DOB: [birth year], 38 y.o.   MRN: [id number] [facility] MD Progress Note  08/01/2013 11:23 AM [name]  MRN:  [id number] Subjective:   Patient states "I feel pretty good today. I feel like I just need to get out of here to go to my Monday 330PM appointment with my lawyer so I can get control of my finances. I slept well, I'm eating OK but I didn't eat much breakfast. I just wasn't that hungry this morning but I had a snack. The only concern I have is that my mouth is kinda dry. ".   Objective:  Patient seen and chart is reviewed. Patient endorsed the absence of auditory hallucinations, racing thoughts, mood swings. Pt is calm, cooperative, answers questions appropriately, and is in NAD. Pt ruminates about getting out in time to see his lawyer on Monday at 330PM. Pt complains of dry mouth as a side effect since starting his new medication. Pt denies SI, HI, contracts for safety. Denies VH.   Diagnosis:   DSM5:  Total Time spent with patient: 20 minutes  AXIS I: Schizoaffective disorder, bipolar type  AXIS II: Deferred  AXIS III:  Past Medical History   Diagnosis  Date   .  Hyperlipidemia     AXIS IV: other psychosocial or environmental problems, problems related to social environment and problems with primary support group  AXIS V: 31-40 Serious Symptoms  ADL's:  Intact  Sleep: Poor  Appetite:  Fair  Suicidal Ideation:  Denies Homicidal Ideation:  Denies AEB (as evidenced by):  Psychiatric Specialty Exam: Physical Exam  Review of Systems  Constitutional: Negative.   HENT: Negative.   Eyes: Negative.   Respiratory: Negative.   Cardiovascular: Negative.   Gastrointestinal: Negative.   Genitourinary: Negative.   Musculoskeletal: Negative.   Skin: Negative.   Neurological: Negative.   Endo/Heme/Allergies: Negative.   Psychiatric/Behavioral: Positive for hallucinations. The patient is nervous/anxious and has insomnia.     Blood pressure  126/84, pulse 101, temperature 97.9 F (36.6 C), temperature source Oral, resp. rate 18, height 6\' 1"  (1.854 m), weight 113.626 kg (250 lb 8 oz), SpO2 97.00%.Body mass index is 33.06 kg/(m^2).  General Appearance: Casual  Eye Contact::  Good  Speech:  Pressured  Volume:  Normal  Mood:  Euphoric  Affect:  Appropriate  Thought Process:  Coherent and Goal Directed  Orientation:  Full (Time, Place, and Person)  Thought Content:  WDL  Suicidal Thoughts:  No  Homicidal Thoughts:  No  Memory:  Immediate;   Fair Recent;   Fair Remote;   Fair  Judgement:  Impaired  Insight:  Lacking  Psychomotor Activity:  Normal  Concentration:  Fair  Recall:  [employer] of Knowledge:Fair  Language: Fair  Akathisia:  No  Handed:  Right  AIMS (if indicated):     Assets:  Communication Skills Desire for Improvement Physical Health  Sleep:  Number of Hours: 5   Musculoskeletal: Strength & Muscle Tone: within normal limits Gait & Station: normal Patient leans: N/A  Current Medications: Current Facility-Administered Medications  Medication Dose Route Frequency Provider Last Rate Last Dose  . acetaminophen (TYLENOL) tablet 650 mg  650 mg Oral Q6H PRN Kerry Hough, PA-C   650 mg at 08/01/13 1117  . alum & mag hydroxide-simeth (MAALOX/MYLANTA) 200-200-20 MG/5ML suspension 30 mL  30 mL Oral Q4H PRN Kerry Hough, PA-C   30 mL at 07/29/13 1012  . ARIPiprazole (ABILIFY) tablet 10  mg  10 mg Oral Daily Kerry HoughSpencer E Simon, PA-C   10 mg at 08/01/13 0736  . atorvastatin (LIPITOR) tablet 20 mg  20 mg Oral Daily Kerry HoughSpencer E Simon, PA-C   20 mg at 08/01/13 0736  . benztropine (COGENTIN) tablet 1 mg  1 mg Oral BID Kerry HoughSpencer E Simon, PA-C   1 mg at 08/01/13 0736  . influenza vac split quadrivalent PF (FLUARIX) injection 0.5 mL  0.5 mL Intramuscular Tomorrow-1000 Mojeed Akintayo      . lithium carbonate (ESKALITH) CR tablet 450 mg  450 mg Oral BID PC Mojeed Akintayo   450 mg at 08/01/13 0738  . loratadine (CLARITIN)  tablet 10 mg  10 mg Oral Daily Kerry HoughSpencer E Simon, PA-C   10 mg at 08/01/13 0736  . magnesium hydroxide (MILK OF MAGNESIA) suspension 30 mL  30 mL Oral Daily PRN Kerry HoughSpencer E Simon, PA-C      . nicotine (NICODERM CQ - dosed in mg/24 hours) patch 21 mg  21 mg Transdermal Daily Mojeed Akintayo   21 mg at 08/01/13 0735  . OLANZapine zydis (ZYPREXA) disintegrating tablet 10 mg  10 mg Oral Q8H PRN Mojeed Akintayo   10 mg at 08/01/13 0327  . traZODone (DESYREL) tablet 150 mg  150 mg Oral QHS Mojeed Akintayo   150 mg at 07/31/13 2150    Lab Results: No results found for this or any previous visit (from the past 48 hour(s)).  Physical Findings: AIMS: Facial and Oral Movements Muscles of Facial Expression: None, normal Lips and Perioral Area: None, normal Jaw: None, normal Tongue: None, normal,Extremity Movements Upper (arms, wrists, hands, fingers): None, normal Lower (legs, knees, ankles, toes): None, normal, Trunk Movements Neck, shoulders, hips: None, normal, Overall Severity Severity of abnormal movements (highest score from questions above): None, normal Incapacitation due to abnormal movements: None, normal, Dental Status Current problems with teeth and/or dentures?: No Does patient usually wear dentures?: No  CIWA:    COWS:     Treatment Plan Summary: Daily contact with patient to assess and evaluate symptoms and progress in treatment Medication management  Plan: 1. Continue crisis management and stabilization.  2. Medication management: Reviewed with patient who stated no untoward effects.  -Continue Abilify 10 mg daily for psychosis. -Continue Lithium CR 450 mg BID for improved mood stability. -Continue Trazodone  150 mg hs for insomnia.  -Received  Abilify Maintena 400 mg IM on 07/29/13.   3. Encouraged patient to attend groups and participate in group counseling sessions and activities.  4. Discharge plan in progress.  5. Continue current treatment plan.  6. Address health  issues: Vitals reviewed and stable.  7. Lithium level on 08/02/13.  Medical Decision Making Problem Points:  Established problem, improving (1), Review of last therapy session (1) and Review of psycho-social stressors (1) Data Points:  Review of medication regiment & side effects (2) Review of new medications or change in dosage (2)  I certify that inpatient services furnished can reasonably be expected to improve the patient's condition.   Beau FannyWithrow, John C, FNP-BC 08/01/2013, 11:23 AM Agree with assessment and plan Madie RenoIrving A. Dub MikesLugo, M.D.

## 2013-08-01 NOTE — Progress Notes (Signed)
Patient ID: [name], male   DOB: 04/22/1976, 38 y.o.   MRN: [id number] 08-01-2013 nursing 1:1 note: D: pt has been visible in the milieu, taking his medications and going to groups. He did have some back pain he rated 5/10. He is anxious and has done some pacing up and down the hallway. A: staff continues to support and encourage this patient. R: he denied any si/hi and has some auditory hallucinations and is mildly paranoid. His thinking is still disorganized. He voiced that his mother is a source of support. On his inventory he wrote: `he slept well, appetite good, energy normal, attention good with his depression and hopelessness both at 0. No w/d symptoms. After discharge he plans to "continue with the ACT team". RN will monitor and Q 15 min ck's continue.

## 2013-08-01 NOTE — Progress Notes (Signed)
Patient ID: [name], male   DOB: 1976-02-27, 38 y.o.   MRN: [id number] Psychoeducational Group Note  Date:  08/01/2013 Time:  0910am  Group Topic/Focus:  Making Healthy Choices:   The focus of this group is to help patients identify negative/unhealthy choices they were using prior to admission and identify positive/healthier coping strategies to replace them upon discharge.  Participation Level:  Active  Participation Quality:  Appropriate  Affect:  Anxious  Cognitive:  Appropriate  Insight:  Supportive  Engagement in Group:  Supportive  Additional Comments:  Inventory group   Valente DavidWeaver, Keenan Dimitrov Brooks 08/01/2013,11:10 AM

## 2013-08-02 DIAGNOSIS — F259 Schizoaffective disorder, unspecified: Secondary | ICD-10-CM | POA: Diagnosis not present

## 2013-08-02 LAB — LITHIUM LEVEL: LITHIUM LVL: 0.58 meq/L — AB (ref 0.80–1.40)

## 2013-08-02 MED ORDER — IBUPROFEN 200 MG PO TABS
400.0000 mg | ORAL_TABLET | Freq: Four times a day (QID) | ORAL | Status: DC | PRN
  Administered 2013-08-02 – 2013-08-04 (×2): 400 mg via ORAL
  Filled 2013-08-02 (×2): qty 2

## 2013-08-02 NOTE — Progress Notes (Signed)
Patient ID: [name], male   DOB: 01/10/1976, 38 y.o.   MRN: [id number] D. The patient has an anxious mood and affect. He paces up and down the hallway. Expresses flight of idea grandiose, paranoid thoughts. 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 A. Met with patient to assess. Encouraged to attend evening group. Reviewed and discussed his medications. R. The patient stated that the voices are lessening. Denied any a/v hallucinations at present. Attended and participated in evening group.

## 2013-08-02 NOTE — Progress Notes (Signed)
Patient ID: [name], male   DOB: 06-16-1975, 38 y.o.   MR[id number] [facility]  08/02/2013 10:15 AM [name]  MRN:  [id number] Subjective:   Patient states "I got the Abilify injection on Thursday. I am happy that I only need to get a shot 12 days in a year, that's awesome. The next thing I have to do is getting my lawyer to make me my own payee.'' Objective:  Patient seen and chart is reviewed. Patient thought process is more organized, he remains restless and paces the unit up and down. He is reporting decreased auditory hallucinations, racing thoughts and mood swings. Patient denies suicidal thoughts or delusional thinking. He has been attending all the unit milieu. He is complaint with medications and has not verbalized adverse effects. Lithium level: 0.58 Diagnosis:   DSM5:  Total Time spent with patient: 20 minutes  AXIS I: Schizoaffective disorder, bipolar type  AXIS II: Deferred  AXIS III:  Past Medical History   Diagnosis  Date   .  Hyperlipidemia     AXIS IV: other psychosocial or environmental problems, problems related to social environment and problems with primary support group  AXIS V: 31-40 Serious Symptoms  ADL's:  Intact  Sleep: fair  Appetite:  Fair  Suicidal Ideation:  Denies Homicidal Ideation:  Denies AEB (as evidenced by):  Psychiatric Specialty Exam: Physical Exam  Review of Systems  Constitutional: Negative.   HENT: Negative.   Eyes: Negative.   Respiratory: Negative.   Cardiovascular: Negative.   Gastrointestinal: Negative.   Genitourinary: Negative.   Musculoskeletal: Negative.   Skin: Negative.   Neurological: Negative.   Endo/Heme/Allergies: Negative.   Psychiatric/Behavioral: Positive for hallucinations. The patient is nervous/anxious and has insomnia.     Blood pressure 129/80, pulse 95, temperature 97.3 F (36.3 C), temperature source Oral, resp. rate 20, height 6\' 1"  (1.854 m), weight 113.626 kg (250 lb 8 oz),  SpO2 97.00%.Body mass index is 33.06 kg/(m^2).  General Appearance: Fairly groomed  Patent attorneyye Contact::  Good  Speech:  Pressured  Volume:  Normal  Mood:  Euphoric  Affect:  Full Range  Thought Process:  Circumstantial  Orientation:  Full (Time, Place, and Person)  Thought Content:  Delusions and Hallucinations: Auditory  Suicidal Thoughts:  No  Homicidal Thoughts:  No  Memory:  Immediate;   Fair Recent;   Fair Remote;   Fair  Judgement:  Impaired  Insight:  Lacking  Psychomotor Activity: Restlessness  Concentration:  Fair  Recall:  [employer]  Fund of Knowledge:Fair  Language: Fair  Akathisia:  No  Handed:  Right  AIMS (if indicated):     Assets:  Communication Skills Desire for Improvement Physical Health  Sleep:  Number of Hours: 5.5   Musculoskeletal: Strength & Muscle Tone: within normal limits Gait & Station: normal Patient leans: N/A  Current Medications: Current Facility-Administered Medications  Medication Dose Route Frequency Provider Last Rate Last Dose  . alum & mag hydroxide-simeth (MAALOX/MYLANTA) 200-200-20 MG/5ML suspension 30 mL  30 mL Oral Q4H PRN Kerry HoughSpencer E Simon, PA-C   30 mL at 08/02/13 1001  . ARIPiprazole (ABILIFY) tablet 10 mg  10 mg Oral Daily Kerry HoughSpencer E Simon, PA-C   10 mg at 08/02/13 0804  . atorvastatin (LIPITOR) tablet 20 mg  20 mg Oral Daily Kerry HoughSpencer E Simon, PA-C   20 mg at 08/02/13 0804  . benztropine (COGENTIN) tablet 1 mg  1 mg Oral BID Kerry HoughSpencer E Simon, PA-C   1 mg  at 08/02/13 0804  . ibuprofen (ADVIL,MOTRIN) tablet 400 mg  400 mg Oral Q6H PRN Alannah Averhart      . influenza vac split quadrivalent PF (FLUARIX) injection 0.5 mL  0.5 mL Intramuscular Tomorrow-1000 Ceaira Ernster      . lithium carbonate (ESKALITH) CR tablet 450 mg  450 mg Oral BID PC Watson Robarge   450 mg at 08/02/13 0804  . loratadine (CLARITIN) tablet 10 mg  10 mg Oral Daily Kerry Hough, PA-C   10 mg at 08/02/13 0804  . magnesium hydroxide (MILK OF MAGNESIA) suspension 30 mL   30 mL Oral Daily PRN Kerry Hough, PA-C      . nicotine (NICODERM CQ - dosed in mg/24 hours) patch 21 mg  21 mg Transdermal Daily Blayne Frankie   21 mg at 08/02/13 0800  . OLANZapine zydis (ZYPREXA) disintegrating tablet 10 mg  10 mg Oral Q8H PRN Eiden Bagot   10 mg at 08/02/13 0351  . traZODone (DESYREL) tablet 150 mg  150 mg Oral QHS Damire Remedios   150 mg at 07/31/13 2150    Lab Results:  Results for orders placed during the hospital encounter of 07/27/13 (from the past 48 hour(s))  LITHIUM LEVEL     Status: Abnormal   Collection Time    08/02/13  6:20 AM      Result Value Ref Range   Lithium Lvl 0.58 (*) 0.80 - 1.40 mEq/L   Comment: Performed at [facility]    Physical Findings: AIMS: Facial and Oral Movements Muscles of Facial Expression: None, normal Lips and Perioral Area: None, normal Jaw: None, normal Tongue: None, normal,Extremity Movements Upper (arms, wrists, hands, fingers): None, normal Lower (legs, knees, ankles, toes): None, normal, Trunk Movements Neck, shoulders, hips: None, normal, Overall Severity Severity of abnormal movements (highest score from questions above): None, normal Incapacitation due to abnormal movements: None, normal, Dental Status Current problems with teeth and/or dentures?: No Does patient usually wear dentures?: No  CIWA:    COWS:     Treatment Plan Summary: Daily contact with patient to assess and evaluate symptoms and progress in treatment Medication management  Plan: 1. Continue crisis management and stabilization.  2. Medication management: Reviewed with patient who stated no untoward effects. - Continue Abilify 10 mg daily for psychosis. -Continue Lithium CR 450 mg BID for improved mood stability. -Continue Trazodone  150 mg hs for insomnia.  -Received  Abilify Maintena 400 mg IM on 07/29/13.  3. Encouraged patient to attend groups and participate in group counseling sessions and activities.  4.  Discharge plan in progress.  5. Continue current treatment plan.  6. Address health issues: Vitals reviewed and stable.  7. Lithium level on 08/02/13. Medical Decision Making Problem Points:  Established problem, improving (1), Review of last therapy session (1) and Review of psycho-social stressors (1) Data Points:  Review of medication regiment & side effects (2) Review of new medications or change in dosage (2)  I certify that inpatient services furnished can reasonably be expected to improve the patient's condition.   Thedore Mins, MD 08/02/2013, 10:15 AM

## 2013-08-02 NOTE — Progress Notes (Signed)
Adult Psychoeducational Group Note  Date:  08/02/2013 Time:  10:15 AM  Group Topic/Focus:  Wellness Toolbox:   The focus of this group is to discuss various aspects of wellness, balancing those aspects and exploring ways to increase the ability to experience wellness.  Patients will create a wellness toolbox for use upon discharge.  Participation Level:  Minimal  Participation Quality:  Appropriate and Drowsy  Affect:  Appropriate and Lethargic  Cognitive:  Appropriate  Insight: Lacking  Engagement in Group:  Poor  Modes of Intervention:  Education  Additional Comments:  Pt stated that he was tired and had trouble staying awake when sitting still.  [name], [name] M 08/02/2013, 10:15 AM

## 2013-08-02 NOTE — [facility] Group Notes (Addendum)
[facility] LCSW Group Therapy  08/02/2013 2:20 PM  Type of Therapy:  Group Therapy  Participation Level:  Minimal  Participation Quality:  Attentive  Affect:  Appropriate  Cognitive:  Alert and Oriented  Insight:  Developing/Improving  Engagement in Therapy:  Engaged  Modes of Intervention:  Discussion, Exploration and Problem-solving  Summary of Progress/Problems:  In this group patients will be encouraged to explore what they see as obstacles to their own wellness and recovery. They will be guided to discuss their thoughts, feelings, and behaviors related to these obstacles. The group will process together ways to cope with barriers, with attention given to specific choices patients can make. Each patient will be challenged to identify changes they are motivated to make in order to overcome their obstacles.  [name] attended first few minutes of group, but then left reporting [name] was too tired and did not want to fall asleep on this Clinical research associatewriter.  [name] politely excused himself. [name], [name] 08/02/2013, 2:20 PM

## 2013-08-02 NOTE — Progress Notes (Signed)
Adult Psychoeducational Group Note  Date:  08/02/2013 Time:  9:45 PM  Group Topic/Focus:  Wrap-Up Group:   The focus of this group is to help patients review their daily goal of treatment and discuss progress on daily workbooks.  Participation Level:  Minimal  Participation Quality:  Inattentive  Affect:  Labile  Cognitive:  Lacking  Insight: Limited  Engagement in Group:  Limited  Modes of Intervention:  Education, Exploration, Socialization and Support  Additional Comments:  Patient attended and participated in group tonight. He report having a good day. He had visitors, went outside to play basket ball, went to his groups and meals. The patient advised that when he is well he and his family looks younger because when he is not everyone is stressed. He also exercise when he is well.  Lita MainsFrancis, Malcome Ambrocio [facility] 08/02/2013, 9:45 PM

## 2013-08-02 NOTE — Progress Notes (Signed)
D: Patient in hte hallway on approach.  Patient presents animated and laughing when speaking to Clinical research associatewriter.  Patient states he had a good day.  Patient states he has been taking his medications and attending group.  Patient states he has learned that he has to continue to take is medications.  Patient pacing in the hallway tonight.   Patient denies SI/HI and denies AVH. A: Staff to monitor Q 15 mins for safety.  Encouragement and support offered.  Scheduled medications administered per orders.  Zyprexa administered prn.  R: Patient remains safe on the unit.  Patient attended group tonight.  Patient visible on the unit.  Patient taking administered medications.

## 2013-08-02 NOTE — [facility] Group Notes (Signed)
[facility]   08/02/2013 9:44 AM  Participation Quality:  Sleepy/Drowsy  Mood/Affect:  Appropriate  Depression Rating:  0   Anxiety Rating:  0  Thoughts of Suicide:  No Will you contract for safety?   Yes  Current AVH:  No  Plan for Discharge/Comments:  [name] will be discharging on Wednesday. He plans to give up his apartment and return home with his mother.  [facility] will [facility] his medications and therapy. [name] was drowsy this morning reporting he fell asleep without his medication, then woke up around 3am and needed meds.  Transportation Means:  Mother or biological father  Supports: family.  Raye Sorrowoble, Aurielle Slingerland N

## 2013-08-02 NOTE — Progress Notes (Signed)
D: Pt. Somewhat childlike at times. Appears limited with thinking and judgement. He often paces back and forth down the hallway.  Pt. Reports chronic back pain.  Pt reports that he is lactose intolerant after drinking milk- reporting indigestion.   A: Pt. Given tylenol for backache and maalox for indigestion-With relief.  A: Support/encouragement given. R: Pt remains safe. Denies SI/HI.

## 2013-08-03 DIAGNOSIS — F259 Schizoaffective disorder, unspecified: Secondary | ICD-10-CM | POA: Diagnosis not present

## 2013-08-03 MED ORDER — HYDROXYZINE HCL 25 MG PO TABS: 25.0000 mg | ORAL_TABLET | Freq: Four times a day (QID) | ORAL | Status: DC | PRN

## 2013-08-03 MED ORDER — BIOTENE DRY MOUTH MT LIQD
15.0000 mL | OROMUCOSAL | Status: DC | PRN
  Administered 2013-08-03 – 2013-08-04 (×2): 15 mL via OROMUCOSAL
  Filled 2013-08-03: qty 15

## 2013-08-03 MED ORDER — HYDROXYZINE HCL 50 MG PO TABS
50.0000 mg | ORAL_TABLET | Freq: Once | ORAL | Status: AC
  Administered 2013-08-03: 50 mg via ORAL
  Filled 2013-08-03 (×2): qty 1

## 2013-08-03 NOTE — Progress Notes (Signed)
Patient ID: [name], male   DOB: December 09, 1975, 38 y.o.   MRN: [id number] D: Patient remains fidgety and restless. His affect is bright, smiling occasionally.  He paces the halls when not in group.  He finds it difficult to sit through groups, leaving early at times.  Patient is not as hyperverbal today. He complains of chronic back pain.  He reports decreased auditory hallucinations.  He denies any depressive symptoms/SI.  He rates his depression as "0."  Patient states he is improving with his medications and [facility].  He denies HI.  Plan for discharge is Wednesday.  A: continue to monitor medication management and MD orders.  Safety checks completed every 15 minutes per protocol.  R: patient is receptive to staff; his behavior is appropriate.

## 2013-08-03 NOTE — Progress Notes (Signed)
Patient ID: [name], male   DOB: 07-14-1975, 38 y.o.   MRN: [id number] Patient's mother called and expressed her concern over his discharge.  Mother stated that she saw her son yesterday and he was agitated.  Her concern is that he will come home no better than when he was admitted.  Staff informed her that his behavior has been cooperative and that his restlessness and pressured speech had improved.  Mother requested that a prescription for zyprexa be given for agitation.  She was also upset that she had not received a call from [facility].  [facility] also [facility]  Assured her that [facility] would [facility].

## 2013-08-03 NOTE — Progress Notes (Signed)
D: Patient in the hallway on approach talking to himself.  When writer asked who he was talking to he pointed to someone in the hallway.  Patient states he had a good day.  Patient states he is supposed to be discharged tomorrow but states he wants to get control of his money.  Patient state his mother keeps his money for him.  Patient states he has been attending group and states he has learned that he needs to take his medication.  Patient denies SI/HI and denies AVH.   A: Staff to monitor Q 15 mins for safety.  Encouragement and support offered.  Scheduled medications administered per orders. R: Patient remains safe on the unit.  Patient attended group tonight.  Patient visible on the unit and interacting with peers.  Patient taking administered medications.

## 2013-08-03 NOTE — Progress Notes (Signed)
Patient ID: [name], male   DOB: 28-Jul-1975, 38 y.o.   MRN: [id number] [facility] MD Progress Note  08/03/2013 11:03 AM PASCHAL BLANTON  MRN:  [id number] Subjective:   Patient states "`I've been to the hospital many times. My mother can tell me when I'm sick. She always says that I talk fast, but I'm not sure about that. I did not realize that skipping some of my Lithium would cause so many problems. But it's also good that I am taking the long acting shots now. I don't need to go to school for music. My friend will just show me how. When I make money I want to pay back the government for how they helped me."   Objective:  Patient seen and chart is reviewed. Patient thought process is more organized, he remains restless and paces the unit up and down. He is reporting resolving auditory hallucinations, racing thoughts and mood swings. Patient is interacting well with peers and attending the scheduled groups. He expresses some grandiose delusions about learning to make music on his friend's computer that will later make him rich. Patient needs redirection to focus on his psychiatric symptoms rather than talk about remote events from the past. He admits that medication noncompliance is the biggest reason for his repeated hospitalizations that he reports included four months at [facility]. Patient reports that he now realizes that skipping medication doses is not a wise decisions as he wishes to stay out of the hospital.   Diagnosis:   DSM5:  Total Time spent with patient: 15 minutes  AXIS I: Schizoaffective disorder, bipolar type  AXIS II: Deferred  AXIS III:  Past Medical History   Diagnosis  Date   .  Hyperlipidemia     AXIS IV: other psychosocial or environmental problems, problems related to social environment and problems with primary support group  AXIS V:  51-60 Moderate Symptoms  ADL's:  Intact  Sleep: fair  Appetite:  Fair  Suicidal Ideation:  Denies Homicidal Ideation:  Denies AEB (as  evidenced by):  Psychiatric Specialty Exam: Physical Exam  Review of Systems  Constitutional: Negative.   HENT: Negative.   Eyes: Negative.   Respiratory: Negative.   Cardiovascular: Negative.   Gastrointestinal: Negative.   Genitourinary: Negative.   Musculoskeletal: Negative.   Skin: Negative.   Neurological: Negative.   Endo/Heme/Allergies: Negative.   Psychiatric/Behavioral: Negative for hallucinations. The patient is nervous/anxious.      Blood pressure 108/71, pulse 102, temperature 97.8 F (36.6 C), temperature source Oral, resp. rate 16, height 6\' 1"  (1.854 m), weight 113.626 kg (250 lb 8 oz), SpO2 97.00%.Body mass index is 33.06 kg/(m^2).  General Appearance: Casual   Eye Contact::  Good  Speech:  Clear and Coherent  Volume:  Normal  Mood:  Euthymic  Affect:  Full Range  Thought Process:  Circumstantial  Orientation:  Full (Time, Place, and Person)  Thought Content:  Delusions  Suicidal Thoughts:  No  Homicidal Thoughts:  No  Memory:  Immediate;   Fair Recent;   Fair Remote;   Fair  Judgement:  Impaired  Insight:  Lacking  Psychomotor Activity: Increased  Concentration:  Fair  Recall:  [employer] of Knowledge:Fair  Language: Fair  Akathisia:  No  Handed:  Right  AIMS (if indicated):     Assets:  Communication Skills Desire for Improvement Physical Health  Sleep:  Number of Hours: 4.75   Musculoskeletal: Strength & Muscle Tone: within normal limits Gait & Station: normal Patient  leans: N/A  Current Medications: Current Facility-Administered Medications  Medication Dose Route Frequency Provider Last Rate Last Dose  . alum & mag hydroxide-simeth (MAALOX/MYLANTA) 200-200-20 MG/5ML suspension 30 mL  30 mL Oral Q4H PRN Kerry HoughSpencer E Simon, PA-C   30 mL at 08/02/13 1001  . ARIPiprazole (ABILIFY) tablet 10 mg  10 mg Oral Daily Kerry HoughSpencer E Simon, PA-C   10 mg at 08/03/13 [id number]  . atorvastatin (LIPITOR) tablet 20 mg  20 mg Oral Daily Kerry HoughSpencer E Simon, PA-C   20 mg at  08/03/13 [id number]  . benztropine (COGENTIN) tablet 1 mg  1 mg Oral BID Kerry HoughSpencer E Simon, PA-C   1 mg at 08/03/13 [id number]  . hydrOXYzine (ATARAX/VISTARIL) tablet 25 mg  25 mg Oral Q6H PRN Sanjuana KavaAgnes I Nwoko, NP      . ibuprofen (ADVIL,MOTRIN) tablet 400 mg  400 mg Oral Q6H PRN Ketina Mars   400 mg at 08/02/13 1707  . influenza vac split quadrivalent PF (FLUARIX) injection 0.5 mL  0.5 mL Intramuscular Tomorrow-1000 Demetrick Eichenberger      . lithium carbonate (ESKALITH) CR tablet 450 mg  450 mg Oral BID PC [place]   450 mg at 08/03/13 0741  . loratadine (CLARITIN) tablet 10 mg  10 mg Oral Daily Kerry HoughSpencer E Simon, PA-C   10 mg at 08/03/13 [id number]  . magnesium hydroxide (MILK OF MAGNESIA) suspension 30 mL  30 mL Oral Daily PRN Kerry HoughSpencer E Simon, PA-C      . nicotine (NICODERM CQ - dosed in mg/24 hours) patch 21 mg  21 mg Transdermal Daily Ahsha Hinsley   21 mg at 08/03/13 0743  . OLANZapine zydis (ZYPREXA) disintegrating tablet 10 mg  10 mg Oral Q8H PRN Wileen Duncanson   10 mg at 08/02/13 2144  . traZODone (DESYREL) tablet 150 mg  150 mg Oral QHS Katiria Calame   150 mg at 08/02/13 2144    Lab Results:  Results for orders placed during the hospital encounter of 07/27/13 (from the past 48 hour(s))  LITHIUM LEVEL     Status: Abnormal   Collection Time    08/02/13  6:20 AM      Result Value Ref Range   Lithium Lvl 0.58 (*) 0.80 - 1.40 mEq/L   Comment[facility]    Physical Findings: AIMS: Facial and Oral Movements Muscles of Facial Expression: None, normal Lips and Perioral Area: None, normal Jaw: None, normal Tongue: None, normal,Extremity Movements Upper (arms, wrists, hands, fingers): None, normal Lower (legs, knees, ankles, toes): None, normal, Trunk Movements Neck, shoulders, hips: None, normal, Overall Severity Severity of abnormal movements (highest score from questions above): None, normal Incapacitation due to abnormal movements: None, normal Patient's  awareness of abnormal movements (rate only patient's report): No Awareness, Dental Status Current problems with teeth and/or dentures?: No Does patient usually wear dentures?: No  CIWA:    COWS:     Treatment Plan Summary: Daily contact with patient to assess and evaluate symptoms and progress in treatment Medication management  Plan: 1. Continue crisis management and stabilization.  2. Medication management: Reviewed with patient who stated no untoward effects. - Continue Abilify 10 mg daily for psychosis. -Continue Lithium CR 450 mg BID for improved mood stability. -Continue Trazodone  150 mg hs for insomnia.  -Received  Abilify Maintena 400 mg IM on 07/29/13.  3. Encouraged patient to attend groups and participate in group counseling sessions and activities.  4. Discharge plan in progress.  5. Continue current  treatment plan.  6. Address health issues: Vitals reviewed and stable.  7. Lithium level on 08/04/13. Anticipate d/c tomorrow.   Medical Decision Making Problem Points:  Established problem, improving (1), Review of last therapy session (1) and Review of psycho-social stressors (1) Data Points:  Review or order clinical lab tests (1) Review of medication regiment & side effects (2)  I certify that inpatient services furnished can reasonably be expected to improve the patient's condition.   Fransisca Kaufmann, NP-C 08/03/2013, 11:03 AM   Patient seen, evaluated and I agree with notes by Nurse Practitioner. Thedore Mins, MD

## 2013-08-03 NOTE — Care Management Utilization Note (Signed)
   Per State Regulation 482.30  This chart was reviewed for necessity with respect to the patient's Admission/ Duration of stay.  Next review date: 08/06/13  Nicolasa Duckingrystal Morrison RN, BSN

## 2013-08-03 NOTE — Progress Notes (Signed)
Adult Psychoeducational Group Note  Date:  08/03/2013 Time:  9:04 PM  Group Topic/Focus:  Wrap-Up Group:   The focus of this group is to help patients review their daily goal of treatment and discuss progress on daily workbooks.  Participation Level:  Minimal  Participation Quality:  Intrusive  Affect:  Irritable  Cognitive:  Lacking  Insight: Limited  Engagement in Group:  Limited  Modes of Intervention:  Exploration, Socialization and Support  Additional Comments:  Patient attended and participated in group tonight. He reports having a good day. He found out that his mother paid his bills with his money. Today he went outside, went for his meals and attended his groups. For his recovery he plans to stick with the act team and take his medication.  Lita MainsFrancis, Shuntell Foody [facility] 08/03/2013, 9:04 PM

## 2013-08-03 NOTE — Tx Team (Signed)
  Interdisciplinary Treatment Plan Update   Date Reviewed: 08/03/2013 Time Reviewed:  10:46 AM  Progress in Treatment:   Attending groups: Yes Participating in groups: Yes Taking medication as prescribed: Yes  Tolerating medication: Yes Family/Significant other contact made: Yes  ACT team Patient understands diagnosis: No, limited insight  Discussing patient identified problems/goals with staff: Yes, See initial care plan. Medical problems stabilized or resolved: Yes Denies suicidal/homicidal ideation: Yes, In txt team  Patient has not harmed self or others: Yes For review of initial/current patient goals, please see plan of care.  Estimated Length of Stay:  will DC on 08/04/13  Reasons for Continued Hospitalization:  Medication stabilization Paranoia  Delusions   New Problems/Goals identified:  N/A  Discharge Plan or Barriers:   Pt plans to return to mother's house.  [facility].     Additional Comments:  Patient has been real cooperative and engaging on unit.  Compliant with medications and reports poor sleep, but improving each night.  Will notify ACT team of patient DC plan.  Attendees:  Signature: Thedore MinsMojeed Akintayo, MD  05/24/2013 8:10 AM   Signature: Richelle Itood North, LCSW  05/24/2013 8:10 AM   Signature: Fransisca KaufmannLaura Davis, NP  05/24/2013 8:10 AM   Signature: Joslyn Devonaroline Beaudry, RN  05/24/2013 8:10 AM   Signature: Liborio NixonPatrice White, RN  05/24/2013 8:10 AM   Signature:  05/24/2013 8:10 AM   Signature:  05/24/2013 8:10 AM   Signature:    Signature:    Signature:    Signature:    Signature:    Signature:      Scribe for Treatment Team:   Raye Sorrowoble, Zamir Staples N, 08/03/2013, 10:46 AM

## 2013-08-03 NOTE — Progress Notes (Signed)
Adult Psychoeducational Group Note  Date:  08/03/2013 Time:  0900  Group Topic/Focus:  Wellness Toolbox:   The focus of this group is to discuss various aspects of wellness, balancing those aspects and exploring ways to increase the ability to experience wellness.  Patients will create a wellness toolbox for use upon discharge.  Participation Level:  Active  Participation Quality:  Appropriate  Affect:  Appropriate  Cognitive:  Appropriate  Insight: Appropriate  Engagement in Group:  Engaged  Modes of Intervention:  Education  Additional Comments:    Eilidh Marcano L 08/03/2013, 3:11 PM

## 2013-08-03 NOTE — [facility] Group Notes (Signed)
[facility] LCSW Group Therapy  08/03/2013 1:01 PM  Type of Therapy:  Group Therapy  Participation Level:  Active  Participation Quality:  Attentive and Sharing  Affect:  Blunted  Cognitive:  Alert and Oriented  Insight:  Developing/Improving  Engagement in Therapy:  Engaged  Modes of Intervention:  Discussion, Exploration, Problem-solving and Support  Summary of Progress/Problems:  This group will allow patients to explore their thoughts and feelings about diagnoses they have received. Patients will be guided to explore their level of understanding and acceptance of these diagnoses. Facilitator will encourage patients to process their thoughts and feelings about the reactions of others to their diagnosis, and will guide patients in identifying ways to discuss their diagnosis with significant others in their lives.  James JohnBrian was very engaged in group topic reporting he loves himself and he does not care what any label or person says about him.  He reports he has not always been this way, reporting he had low self esteem, but has grown up and understands it does not matter what others think.  He is tangential at times, leaves group and moves around the room. Easily redirected to come back on task.   [name], [name] 08/03/2013, 1:01 PM

## 2013-08-04 DIAGNOSIS — F259 Schizoaffective disorder, unspecified: Secondary | ICD-10-CM | POA: Diagnosis not present

## 2013-08-04 LAB — LITHIUM LEVEL: LITHIUM LVL: 0.66 meq/L — AB (ref 0.80–1.40)

## 2013-08-04 MED ORDER — ARIPIPRAZOLE ER 400 MG IM SUSR: 400.0000 mg | INTRAMUSCULAR | Status: DC

## 2013-08-04 MED ORDER — BENZTROPINE MESYLATE 1 MG PO TABS: 1.0000 mg | ORAL_TABLET | Freq: Two times a day (BID) | ORAL | Status: DC

## 2013-08-04 MED ORDER — ARIPIPRAZOLE 10 MG PO TABS: 10.0000 mg | ORAL_TABLET | Freq: Every day | ORAL | Status: DC

## 2013-08-04 MED ORDER — ATORVASTATIN CALCIUM 20 MG PO TABS: 20.0000 mg | ORAL_TABLET | Freq: Every day | ORAL | Status: DC

## 2013-08-04 MED ORDER — LITHIUM CARBONATE ER 450 MG PO TBCR: 450.0000 mg | EXTENDED_RELEASE_TABLET | Freq: Two times a day (BID) | ORAL | Status: DC

## 2013-08-04 MED ORDER — TRAZODONE HCL 150 MG PO TABS: 150.0000 mg | ORAL_TABLET | Freq: Every day | ORAL | Status: DC

## 2013-08-04 MED ORDER — LITHIUM CARBONATE 300 MG PO CAPS: 300.0000 mg | ORAL_CAPSULE | Freq: Three times a day (TID) | ORAL | Status: DC

## 2013-08-04 NOTE — [facility] Suicide Risk Assessment (Signed)
   Demographic Factors:  Male, Low socioeconomic status, Unemployed and African American  Total Time spent with patient: 20 minutes  Psychiatric Specialty Exam: Physical Exam  Psychiatric: He has a normal mood and affect. His speech is normal and behavior is normal. Judgment and thought content normal. Cognition and memory are normal.    Review of Systems  Constitutional: Negative.   HENT: Negative.   Eyes: Negative.   Respiratory: Negative.   Cardiovascular: Negative.   Gastrointestinal: Negative.   Genitourinary: Negative.   Musculoskeletal: Negative.   Skin: Negative.   Neurological: Negative.   Endo/Heme/Allergies: Negative.   Psychiatric/Behavioral: Negative.     Blood pressure 109/75, pulse 92, temperature 97.3 F (36.3 C), temperature source Oral, resp. rate 20, height 6\' 1"  (1.854 m), weight 113.626 kg (250 lb 8 oz), SpO2 97.00%.Body mass index is 33.06 kg/(m^2).  General Appearance: Fairly Groomed  Patent attorneyye Contact::  Good  Speech:  Clear and Coherent  Volume:  Normal  Mood:  Euthymic  Affect:  Appropriate  Thought Process:  Goal Directed  Orientation:  Full (Time, Place, and Person)  Thought Content:  Negative  Suicidal Thoughts:  No  Homicidal Thoughts:  No  Memory:  Immediate;   Fair Recent;   Fair Remote;   Fair  Judgement:  Fair  Insight:  Fair  Psychomotor Activity:  Normal  Concentration:  Fair  Recall:  [employer]  Fund of Knowledge:Fair  Language: Fair  Akathisia:  No  Handed:  Right  AIMS (if indicated):     Assets:  Communication Skills Desire for Improvement Physical Health  Sleep:  Number of Hours: 5.75    Musculoskeletal: Strength & Muscle Tone: within normal limits Gait & Station: normal Patient leans: N/A   Mental Status Per Nursing Assessment::   On Admission:  NA  Current Mental Status by Physician: patient denies suicidal ideation, intent or plan  Loss Factors: Decrease in vocational status and Financial problems/change in  socioeconomic status  Historical Factors: Impulsivity  Risk Reduction Factors:   Sense of responsibility to family, Living with another person, especially a relative and Positive social support  Continued Clinical Symptoms:  Resolving psychosis and delusions  Cognitive Features That Contribute To Risk:  Closed-mindedness Polarized thinking    Suicide Risk:  Minimal: No identifiable suicidal ideation.  Patients presenting with no risk factors but with morbid ruminations; may be classified as minimal risk based on the severity of the depressive symptoms  Discharge Diagnoses:   AXIS I:  Schizoaffective disorder  AXIS II:  Deferred AXIS III:   Past Medical History  Diagnosis Date  . Paranoid schizophrenia   . Hyperlipidemia   . Schizophrenia   . Bipolar disorder    AXIS IV:  other psychosocial or environmental problems and problems related to social environment AXIS V:  61-70 mild symptoms  Plan Of Care/Follow-up recommendations:  Activity:  as tolerated Diet:  healthy Tests:  Lithium Level: 0.66 Other:  patient to keep his after care appointment  Is patient on multiple antipsychotic therapies at discharge:  No   Has Patient had three or more failed trials of antipsychotic monotherapy by history:  No  Recommended Plan for Multiple Antipsychotic Therapies: NA    Thedore MinsMojeed Sula Fetterly, MD 08/04/2013, 9:57 AM

## 2013-08-04 NOTE — Progress Notes (Signed)
[facility] :  Will you be returning to the same living situation after discharge: Yes,  home with mother, then home to his own apartment after a few days. At discharge, do you have transportation home?:Yes,  mother and step father Do you have the ability to pay for your medications:Yes,  no barriers at this time.  Will follow up with ACT team.  Release of information consent forms completed and in the chart;  Patient's signature needed at discharge.  Patient to Follow up at: Follow-up Information   [facility]  On 08/04/2013. (Follow up with current ACT team.  They were called and notified of your DC.  They will be making contacting with you.)    Contact information:   [address]  [place], [place] [address] [phone]      Patient denies SI/HI:   Yes,  no reports of SI    Safety Planning and Suicide Prevention discussed:  Yes,  see SI note  LCSW called patient's ACT team with regards to discharging patient as per their request. Left message with ACT team worker.  Patient informed to also call ACT team and follow up in which he reports he will and comply.  Evlyn CourierHannah N Anyah Swallow 08/04/2013, 10:04 AM

## 2013-08-04 NOTE — Progress Notes (Signed)
Adult Psychoeducational Group Note  Date:  08/04/2013 Time:  1000  Group Topic/Focus: crisis planning  Crisis Planning:   The purpose of this group is to help patients create a crisis plan for use upon discharge or in the future, as needed.  Participation Level:  Active  Participation Quality:  Sharing  Affect:  Appropriate  Cognitive:  Appropriate  Insight: Good  Engagement in Group:  Engaged  Modes of Intervention:  Discussion  Additional Comments: pt attended and participated in group  Katielynn Horan M Sheppard Luckenbach 08/04/2013, 10:35 AM

## 2013-08-04 NOTE — Discharge Summary (Signed)
Physician Discharge Summary Note  Patient:  [name] is an 38 y.o., male MRN:  [id number] DOB:  03-30-1976 Patient phone:  [phone] (home)  Patient address:   [address] [place] [address],  Total Time spent with patient: 20 minutes  Date of Admission:  07/27/2013 Date of Discharge: 08/04/13  Reason for Admission: Manic behaviors   Discharge Diagnoses: Principal Problem:   Schizoaffective disorder Active Problems:   Psychosis   Psychiatric Specialty Exam: Physical Exam  Psychiatric: He has a normal mood and affect. His speech is normal and behavior is normal. Judgment and thought content normal. Cognition and memory are normal.    Review of Systems  Constitutional: Negative.   HENT: Negative.   Eyes: Negative.   Respiratory: Negative.   Cardiovascular: Negative.   Gastrointestinal: Negative.   Genitourinary: Negative.   Musculoskeletal: Negative.   Skin: Negative.   Neurological: Negative.   Endo/Heme/Allergies: Negative.   Psychiatric/Behavioral: Negative for depression, suicidal ideas, hallucinations, memory loss and substance abuse. The patient is not nervous/anxious and does not have insomnia.     Blood pressure 109/75, pulse 92, temperature 97.3 F (36.3 C), temperature source Oral, resp. rate 20, height 6\' 1"  (1.854 m), weight 113.626 kg (250 lb 8 oz), SpO2 97.00%.Body mass index is 33.06 kg/(m^2).  General Appearance: Fairly Groomed  Patent attorneyye Contact::  Good  Speech:  Clear and Coherent  Volume:  Normal  Mood:  Euthymic  Affect:  Appropriate  Thought Process:  Goal Directed  Orientation:  Full (Time, Place, and Person)  Thought Content:  Negative  Suicidal Thoughts:  No  Homicidal Thoughts:  No  Memory:  Immediate;   Fair Recent;   Fair Remote;   Fair  Judgement:  Fair  Insight:  Fair  Psychomotor Activity:  Normal  Concentration:  Fair  Recall:  FiservFair  Fund of Knowledge:Fair  Language: Fair  Akathisia:  No  Handed:  Right  AIMS (if  indicated):     Assets:  Communication Skills Desire for Improvement Physical Health  Sleep:  Number of Hours: 5.75    Past Psychiatric History: Yes Diagnosis: Schizophrenia   [facility]  Outpatient Care: [facility]  Substance Abuse Care: Denies  Self-Mutilation: Denies  Suicidal Attempts:Denies  Violent Behaviors: Denies   Musculoskeletal: Strength & Muscle Tone: within normal limits Gait & Station: normal Patient leans: N/A  DSM5: AXIS I: Schizoaffective disorder  AXIS II: Deferred  AXIS III:  Past Medical History   Diagnosis  Date   .  Paranoid schizophrenia    .  Hyperlipidemia    .  Schizophrenia    .  Bipolar disorder     AXIS IV: other psychosocial or environmental problems and problems related to social environment  AXIS V: 61-70 mild symptoms   Level of Care:  OP  Hospital Course:  [name] is a 38 year old male who presented via IVC petition which was initiated by his mother [facility]. His family voiced concerns about increasing symptoms of mania the patient has been experiencing for several weeks. He has not been taking his psychiatric medications as prescribed as evidenced by current lithium level of less than 0.25. His mother had sent him to [facility] for an evaluation but left abruptly when staff attempt to IVC him. His mother reported that the patient has been losing in car keys frequently and expressing fears that somebody is going to kill him. Patient states during his psychiatric assessment "My mother wanted me  to be evaluated. She says I have not been acting the same. I sometimes get paranoid. I listen to gangster music all day long. Sometimes it gives me bad ideas. Sometimes I hear a girl screaming. But I'm better now. Some old lady sent me a facebook request but I declined. I think she had connections to make a spiderman movie. I should not have done that." [name] is noted to be a poor historian during his assessment.  His chart is reviewed for pertinent information. The patient is very disorganized with flight of ideas during his assessment. He recants several paranoid stories form his past that do not related to his current condition. Patient needs frequent redirection to answer the assessment questions. He is unable to explain the true reasons about why he was admitted to the hospital. [name] also reports being compliant with medication when he clearly has not been.   Patient was admitted to the 400 hall for further assessment and medication management. His Abilify 10 mg daily for psychosis was restarted. His Lithium was increased to 450 mg BID for improved mood stability. Patient admitted to poor compliance with his medications. His Lithium level was sub-therapeutic on admission. On 07/29/13 the patient received Abilify Maintena 400 mg to improve medication compliance after leaving the hospital. The patient was compliant with his medications. There were no incidences of seclusion or restraint. At times the patient was noted to be restless as he paced in the hallway. However, the patient reported decreased auditory hallucinations, racing thoughts and mood swings. His speech gradually became less pressured and his thoughts more organized. At times he would easily get off topic talking about irrelevant events from the past or his dream to make money from making music. Patient was pleasant and interacted well with peers. He was compliant with his medications and denied any adverse effects. Patient planned to return to his mother's house after discharge. Patient was found stable to discharge by the treatment team. He was provided prescriptions and medication samples upon discharge. [name] left [facility] in stable condition with all personal belongings returned to him. He will be followed by the [facility] ACT team.   Consults:  psychiatry  Significant Diagnostic Studies:  Admission labs WNL  Discharge Vitals:   Blood pressure 109/75,  pulse 92, temperature 97.3 F (36.3 C), temperature source Oral, resp. rate 20, height 6\' 1"  (1.854 m), weight 113.626 kg (250 lb 8 oz), SpO2 97.00%. Body mass index is 33.06 kg/(m^2). Lab Results:   Results for orders placed during the hospital encounter of 07/27/13 (from the past 72 hour(s))  LITHIUM LEVEL     Status: Abnormal   Collection Time    08/02/13  6:20 AM      Result Value Ref Range   Lithium Lvl 0.58 (*) 0.80 - 1.40 mEq/L   Comment: Performed at [facility]  LITHIUM LEVEL     Status: Abnormal   Collection Time    08/04/13  6:10 AM      Result Value Ref Range   Lithium Lvl 0.66 (*) 0.80 - 1.40 mEq/L   Comment: Performed at [facility]    Physical Findings: AIMS: Facial and Oral Movements Muscles of Facial Expression: None, normal Lips and Perioral Area: None, normal Jaw: None, normal Tongue: None, normal,Extremity Movements Upper (arms, wrists, hands, fingers): None, normal Lower (legs, knees, ankles, toes): None, normal, Trunk Movements Neck, shoulders, hips: None, normal, Overall Severity Severity of abnormal movements (highest score from questions above): None, normal  Incapacitation due to abnormal movements: None, normal Patient's awareness of abnormal movements (rate only patient's report): No Awareness, Dental Status Current problems with teeth and/or dentures?: No Does patient usually wear dentures?: No  CIWA:    COWS:     Psychiatric Specialty Exam: See Psychiatric Specialty Exam and Suicide Risk Assessment completed by Attending Physician prior to discharge.  Discharge destination:  Home  Is patient on multiple antipsychotic therapies at discharge:  No   Has Patient had three or more failed trials of antipsychotic monotherapy by history:  No  Recommended Plan for Multiple Antipsychotic Therapies: NA     Medication List    STOP taking these medications       buPROPion 300 MG 24 hr tablet  Commonly known  as:  WELLBUTRIN XL      TAKE these medications     Indication   ARIPiprazole 400 MG Susr  Commonly known as:  ABILIFY MAINTENA  Inject 400 mg into the muscle every 28 (twenty-eight) days. Patient received first dose on 07/29/13, will be due again in 28 days.   Indication:  Schizoaffective Disorder     ARIPiprazole 10 MG tablet  Commonly known as:  ABILIFY  Take 1 tablet (10 mg total) by mouth daily.   Indication:  Rapidly Alternating Manic-Depressive Psychosis     atorvastatin 20 MG tablet  Commonly known as:  LIPITOR  Take 1 tablet (20 mg total) by mouth daily.   Indication:  Disease of the Heart and Blood Vessels     benztropine 1 MG tablet  Commonly known as:  COGENTIN  Take 1 tablet (1 mg total) by mouth 2 (two) times daily.   Indication:  Extrapyramidal Reaction caused by Medications     lithium carbonate 450 MG CR tablet  Commonly known as:  ESKALITH  Take 1 tablet (450 mg total) by mouth 2 (two) times daily after a meal.   Indication:  Manic-Depression, Schizoaffective Disorder     lithium carbonate 300 MG capsule  Take 1 capsule (300 mg total) by mouth 3 (three) times daily with meals.   Indication:  Schizoaffective Disorder     traZODone 150 MG tablet  Commonly known as:  DESYREL  Take 1 tablet (150 mg total) by mouth at bedtime.   Indication:  Trouble Sleeping           Follow-up Information   Follow up with [facility] ACT team  On 08/04/2013. (Follow up with current ACT team.  They were called and notified of your DC.  They will be making contacting with you.)    Contact information:   [address]  [place], [place] [address] [phone]      Follow-up recommendations:   Activity: as tolerated  Diet: healthy  Tests: Lithium Level: 0.66  Other: patient to keep his after care appointment   Comments:   Take all your medications as prescribed by your mental healthcare provider.  Report any adverse effects and or reactions from your medicines to your outpatient  provider promptly.  Patient is instructed and cautioned to not engage in alcohol and or illegal drug use while on prescription medicines.  In the event of worsening symptoms, patient is instructed to call the crisis hotline, 911 and or go to the nearest ED for appropriate evaluation and treatment of symptoms.  Follow-up with your primary care provider for your other medical issues, concerns and or health care needs.   Total Discharge Time:  Greater than 30 minutes.  Signed: Fransisca Kaufmann  NP-C 08/04/2013, 11:17 AM  Patient seen, evaluated and I agree with notes by Nurse Practitioner. Thedore Mins, MD

## 2013-08-04 NOTE — Progress Notes (Signed)
Patient ID: [name], male   DOB: 12-27-1975, 38 y.o.   MRN: [id number] Nursing discharge note:  Patient discharged home per MD order.  Patient received all personal belongings, prescriptions and medication samples.  [facility].  Patient denies SI/HI/AVH.  Reviewed discharge and medication instructions and patient indicated understanding of same.  Patient left ambulatory with his mother.

## 2013-08-04 NOTE — [facility] Group Notes (Signed)
[facility] Dis[facility] Note   08/04/2013 9:53 AM  Participation Quality:  Active and Engaged  Mood/Affect:  Appropriate  Depression Rating:  denies  Anxiety Rating:  denies  Thoughts of Suicide:  No Will you contract for safety?   Yes  Current AVH:  No  Plan for Discharge/Comments:  [name] will be discharge today to his mother and step father. [facility] and [facility]  He reports he is excited to be going home, will stay with mother for a few days then return to his apartment.   Transportation Means: mother and step father  Supports:ACT team, mother and step father  Raye SorrowHannah N Minetta Krisher

## 2013-08-06 NOTE — Progress Notes (Signed)
Patient Discharge Instructions:  After Visit Summary (AVS):   Faxed to:  08/06/13 Discharge Summary Note:   Faxed to:  08/06/13 Psychiatric Admission Assessment Note:   Faxed to:  08/06/13 Suicide Risk Assessment - Discharge Assessment:   Faxed to:  08/06/13 Faxed/Sent to the Next Level Care provider:  08/06/13 [facility] @ [phone] ROI for [facility] was received 08/05/13  Jerelene ReddenSheena E [place], 08/06/2013, 1:27 PM

## 2013-08-13 DIAGNOSIS — F209 Schizophrenia, unspecified: Secondary | ICD-10-CM | POA: Diagnosis not present

## 2013-08-30 DIAGNOSIS — I10 Essential (primary) hypertension: Secondary | ICD-10-CM | POA: Diagnosis not present

## 2013-08-30 DIAGNOSIS — F2089 Other schizophrenia: Secondary | ICD-10-CM | POA: Diagnosis not present

## 2013-08-30 DIAGNOSIS — E7800 Pure hypercholesterolemia, unspecified: Secondary | ICD-10-CM | POA: Diagnosis not present

## 2013-08-30 DIAGNOSIS — E782 Mixed hyperlipidemia: Secondary | ICD-10-CM | POA: Diagnosis not present

## 2013-09-02 DIAGNOSIS — L819 Disorder of pigmentation, unspecified: Secondary | ICD-10-CM | POA: Diagnosis not present

## 2013-09-02 DIAGNOSIS — L708 Other acne: Secondary | ICD-10-CM | POA: Diagnosis not present

## 2013-09-06 DIAGNOSIS — F209 Schizophrenia, unspecified: Secondary | ICD-10-CM | POA: Diagnosis not present

## 2014-04-01 ENCOUNTER — Emergency Department (HOSPITAL_COMMUNITY)
Admission: EM | Admit: 2014-04-01 | Discharge: 2014-04-01 | Disposition: A | Discharge status: Home / Self Care | Payer: No Typology Code available for payment source | Attending: Emergency Medicine | Admitting: Emergency Medicine

## 2014-04-01 ENCOUNTER — Emergency Department (HOSPITAL_COMMUNITY): Payer: No Typology Code available for payment source

## 2014-04-01 ENCOUNTER — Encounter (HOSPITAL_COMMUNITY): Payer: Self-pay | Admitting: *Deleted

## 2014-04-01 DIAGNOSIS — S60032A Contusion of left middle finger without damage to nail, initial encounter: Secondary | ICD-10-CM | POA: Diagnosis not present

## 2014-04-01 DIAGNOSIS — Z720 Tobacco use: Secondary | ICD-10-CM | POA: Diagnosis not present

## 2014-04-01 DIAGNOSIS — E785 Hyperlipidemia, unspecified: Secondary | ICD-10-CM | POA: Diagnosis not present

## 2014-04-01 DIAGNOSIS — F209 Schizophrenia, unspecified: Secondary | ICD-10-CM | POA: Insufficient documentation

## 2014-04-01 DIAGNOSIS — S3992XA Unspecified injury of lower back, initial encounter: Secondary | ICD-10-CM | POA: Diagnosis present

## 2014-04-01 DIAGNOSIS — Y92410 Unspecified street and highway as the place of occurrence of the external cause: Secondary | ICD-10-CM | POA: Diagnosis not present

## 2014-04-01 DIAGNOSIS — Z79899 Other long term (current) drug therapy: Secondary | ICD-10-CM | POA: Diagnosis not present

## 2014-04-01 DIAGNOSIS — M79645 Pain in left finger(s): Secondary | ICD-10-CM | POA: Diagnosis not present

## 2014-04-01 DIAGNOSIS — S39012A Strain of muscle, fascia and tendon of lower back, initial encounter: Secondary | ICD-10-CM | POA: Diagnosis not present

## 2014-04-01 DIAGNOSIS — Y9389 Activity, other specified: Secondary | ICD-10-CM | POA: Diagnosis not present

## 2014-04-01 DIAGNOSIS — S6000XA Contusion of unspecified finger without damage to nail, initial encounter: Secondary | ICD-10-CM

## 2014-04-01 DIAGNOSIS — F319 Bipolar disorder, unspecified: Secondary | ICD-10-CM | POA: Insufficient documentation

## 2014-04-01 DIAGNOSIS — Y998 Other external cause status: Secondary | ICD-10-CM | POA: Diagnosis not present

## 2014-04-01 DIAGNOSIS — Z792 Long term (current) use of antibiotics: Secondary | ICD-10-CM | POA: Insufficient documentation

## 2014-04-01 DIAGNOSIS — S6992XA Unspecified injury of left wrist, hand and finger(s), initial encounter: Secondary | ICD-10-CM | POA: Diagnosis not present

## 2014-04-01 MED ORDER — CYCLOBENZAPRINE HCL 5 MG PO TABS: 5.0000 mg | ORAL_TABLET | Freq: Three times a day (TID) | ORAL | Status: DC | PRN

## 2014-04-01 MED ORDER — TRAMADOL HCL 50 MG PO TABS
50.0000 mg | ORAL_TABLET | Freq: Four times a day (QID) | ORAL | Status: DC | PRN
Start: 2014-04-01 — End: 2014-05-27

## 2014-04-01 MED ORDER — IBUPROFEN 800 MG PO TABS
800.0000 mg | ORAL_TABLET | Freq: Once | ORAL | Status: AC
  Administered 2014-04-01: 800 mg via ORAL

## 2014-04-01 MED ORDER — CYCLOBENZAPRINE HCL 10 MG PO TABS
10.0000 mg | ORAL_TABLET | Freq: Once | ORAL | Status: AC
  Administered 2014-04-01: 10 mg via ORAL
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
## 2014-04-01 NOTE — ED Notes (Signed)
No answer
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
## 2014-04-26 DIAGNOSIS — Z23 Encounter for immunization: Secondary | ICD-10-CM | POA: Diagnosis not present

## 2014-05-27 ENCOUNTER — Encounter (HOSPITAL_COMMUNITY): Payer: Self-pay | Admitting: *Deleted

## 2014-05-27 ENCOUNTER — Emergency Department (HOSPITAL_COMMUNITY)
Admission: EM | Admit: 2014-05-27 | Discharge: 2014-05-28 | Disposition: A | Discharge status: Home / Self Care | Payer: Medicare Other | Attending: Emergency Medicine | Admitting: Emergency Medicine

## 2014-05-27 DIAGNOSIS — F17210 Nicotine dependence, cigarettes, uncomplicated: Secondary | ICD-10-CM | POA: Insufficient documentation

## 2014-05-27 DIAGNOSIS — F319 Bipolar disorder, unspecified: Secondary | ICD-10-CM | POA: Insufficient documentation

## 2014-05-27 DIAGNOSIS — E785 Hyperlipidemia, unspecified: Secondary | ICD-10-CM | POA: Insufficient documentation

## 2014-05-27 DIAGNOSIS — Z008 Encounter for other general examination: Secondary | ICD-10-CM | POA: Diagnosis present

## 2014-05-27 DIAGNOSIS — F29 Unspecified psychosis not due to a substance or known physiological condition: Secondary | ICD-10-CM | POA: Diagnosis not present

## 2014-05-27 DIAGNOSIS — F200 Paranoid schizophrenia: Secondary | ICD-10-CM | POA: Insufficient documentation

## 2014-05-27 LAB — CBC WITH DIFFERENTIAL/PLATELET
BASOS PCT: 0 % (ref 0–1)
Basophils Absolute: 0 10*3/uL (ref 0.0–0.1)
EOS ABS: 0.2 10*3/uL (ref 0.0–0.7)
EOS PCT: 2 % (ref 0–5)
HEMATOCRIT: 42.3 % (ref 39.0–52.0)
HEMOGLOBIN: 14.5 g/dL (ref 13.0–17.0)
LYMPHS ABS: 3.2 10*3/uL (ref 0.7–4.0)
Lymphocytes Relative: 24 % (ref 12–46)
MCH: 30.2 pg (ref 26.0–34.0)
MCHC: 34.3 g/dL (ref 30.0–36.0)
MCV: 88.1 fL (ref 78.0–100.0)
MONO ABS: 0.5 10*3/uL (ref 0.1–1.0)
MONOS PCT: 4 % (ref 3–12)
Neutro Abs: 9.1 10*3/uL — ABNORMAL HIGH (ref 1.7–7.7)
Neutrophils Relative %: 70 % (ref 43–77)
PLATELETS: 329 10*3/uL (ref 150–400)
RBC: 4.8 MIL/uL (ref 4.22–5.81)
RDW: 12.4 % (ref 11.5–15.5)
WBC: 13 10*3/uL — ABNORMAL HIGH (ref 4.0–10.5)

## 2014-05-27 LAB — BASIC METABOLIC PANEL
ANION GAP: 6 (ref 5–15)
BUN: 13 mg/dL (ref 6–23)
CHLORIDE: 106 mmol/L (ref 96–112)
CO2: 25 mmol/L (ref 19–32)
Calcium: 8.9 mg/dL (ref 8.4–10.5)
Creatinine, Ser: 1.16 mg/dL (ref 0.50–1.35)
GFR calc Af Amer: >90 mL/min (ref >90)
GFR calc non Af Amer: 78 mL/min — ABNORMAL LOW (ref >90)
Glucose, Bld: 109 mg/dL — ABNORMAL HIGH (ref 70–99)
Potassium: 3.6 mmol/L (ref 3.5–5.1)
Sodium: 137 mmol/L (ref 135–145)

## 2014-05-27 LAB — RAPID URINE DRUG SCREEN, HOSP PERFORMED
Amphetamines: NOT DETECTED
BARBITURATES: NOT DETECTED
Benzodiazepines: NOT DETECTED
Cocaine: NOT DETECTED
Opiates: NOT DETECTED
TETRAHYDROCANNABINOL: NOT DETECTED

## 2014-05-27 LAB — ETHANOL: Alcohol, Ethyl (B): 5 mg/dL (ref 0–9)

## 2014-05-27 LAB — LITHIUM LEVEL

## 2014-05-27 MED ORDER — ATORVASTATIN CALCIUM 20 MG PO TABS
20.0000 mg | ORAL_TABLET | Freq: Every day | ORAL | Status: DC
  Filled 2014-05-27 (×2): qty 1

## 2014-05-27 MED ORDER — TRAZODONE HCL 50 MG PO TABS
150.0000 mg | ORAL_TABLET | Freq: Every day | ORAL | Status: DC
  Administered 2014-05-27: 150 mg via ORAL
  Filled 2014-05-27: qty 3

## 2014-05-27 MED ORDER — LITHIUM CARBONATE 300 MG PO CAPS
ORAL_CAPSULE | ORAL | Status: AC
  Filled 2014-05-27: qty 2

## 2014-05-27 MED ORDER — NICOTINE 21 MG/24HR TD PT24
21.0000 mg | MEDICATED_PATCH | Freq: Once | TRANSDERMAL | Status: DC
  Administered 2014-05-27: 21 mg via TRANSDERMAL
  Filled 2014-05-27: qty 1

## 2014-05-27 MED ORDER — ARIPIPRAZOLE 10 MG PO TABS
20.0000 mg | ORAL_TABLET | Freq: Every day | ORAL | Status: DC
  Administered 2014-05-27: 20 mg via ORAL
  Filled 2014-05-27 (×3): qty 2

## 2014-05-27 MED ORDER — LITHIUM CARBONATE 300 MG PO CAPS
600.0000 mg | ORAL_CAPSULE | Freq: Two times a day (BID) | ORAL | Status: DC
  Administered 2014-05-27: 600 mg via ORAL
  Filled 2014-05-27 (×4): qty 2

## 2014-05-27 MED ORDER — ARIPIPRAZOLE 10 MG PO TABS
ORAL_TABLET | ORAL | Status: AC
  Filled 2014-05-27: qty 2

## 2014-05-27 MED ORDER — BENZTROPINE MESYLATE 1 MG PO TABS
1.0000 mg | ORAL_TABLET | Freq: Two times a day (BID) | ORAL | Status: DC
  Administered 2014-05-27: 1 mg via ORAL
  Filled 2014-05-27: qty 1

## 2014-05-27 NOTE — BH Assessment (Signed)
Received notification of TTS consult. Spoke to Dr. Samuel JesterKathleen McManus who said [facility]. Tele-assessment will be initiated.  Harlin RainFord Ellis Ria CommentWarrick Jr, LPC, [facility] Triage Specialist [phone]
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
## 2014-05-27 NOTE — BH Assessment (Signed)
Assessment complete. Binnie RailJoann Glover, [facility], confirms adult unit is at capacity. Gave clinical report to Janann Augustori Burkett, NP who said Pt meets inpatient criteria. TTS will contact other facilities for placement. Notified Jasmine DecemberSharon, RN and Dr. Samuel JesterKathleen McManus of recommendation.  Harlin RainFord Ellis Ria CommentWarrick Jr, LPC, [facility] Triage Specialist [phone]
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
## 2014-05-28 DIAGNOSIS — F29 Unspecified psychosis not due to a substance or known physiological condition: Secondary | ICD-10-CM | POA: Diagnosis not present

## 2014-05-28 MED ORDER — LORAZEPAM 1 MG PO TABS
1.0000 mg | ORAL_TABLET | Freq: Once | ORAL | Status: AC
  Administered 2014-05-28: 1 mg via ORAL

## 2014-05-28 MED ORDER — LORAZEPAM 1 MG PO TABS
ORAL_TABLET | ORAL | Status: AC
  Filled 2014-05-28: qty 1
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
## 2014-05-28 NOTE — ED Notes (Signed)
For discharge, pt calm, cooperative states [facility] This coming week and will keep it

## 2014-05-28 NOTE — ED Provider Notes (Signed)
Patient now is stating he would like to be discharged home. I personally assessed the patient. He is neither homicidal nor suicidal. He is not acutely psychotic. He does have delusional ideas. [facility] TTS was reconsulted to see the patient. Stated they believe he would benefit from inpatient treatment but that he does not need IVC criteria. Therefore the patient will be discharged home to follow-up as an outpatient.  Loren Raceravid Jillienne Egner, MD 05/28/14 [phone]

## 2014-05-28 NOTE — Discharge Instructions (Signed)
Paranoia °Paranoia is a distrust of others that is not based on a real reason for distrust. This may reach delusional levels. This means the paranoid person feels the world is against them when there is no reason to make them feel that way. People with paranoia feel as though people around them are "out to get them".  °SIMILAR MENTAL ILNESSES °· Depression is a feeling as though you are down all the time. It is normal in some situations where you have just lost a loved one. It is abnormal if you are having feelings of paranoia with it. °· Dementia is a physical problem with the brain in which the brain no longer works properly. There are problems with daily activities of living. Alzheimer's disease is one example of this. Dementia is also caused by old age changes in the brain which come with the death of brain cells and small strokes. °· Paranoidschizophrenia. People with paranoid schizophrenia and persecutory delusional disorder have delusions in which they feel people around them are plotting against them. Persecutory delusions in paranoid schizophrenia are bizarre, sometimes grandiose, and often accompanied by auditory hallucinations. This means the person is hearing voices that are not there. °· Delusionaldisorder (persecutory type). Delusions experienced by individuals with delusional disorder are more believable than those experienced by paranoid schizophrenics; they are not bizarre, though still unjustified. Individuals with delusional disorder may seem offbeat or quirky rather than mentally ill, and therefore, may never seek treatment. °All of these problems usually do not allow these people to interact socially in an acceptable manner. °CAUSES °The cause of paranoia is often not known. It is common in people with extended abuse of: °· Cocaine. °· Amphetamine. °· Marijuana. °· Alcohol. °Sometimes there is an inherited tendency. It may be associated with stress or changes in brain chemistry. °DIAGNOSIS    °When paranoia is present, your caregiver may: °· Refer you to a specialist. °· Do a physical exam. °· Perform other tests on you to make sure there are not other problems causing the paranoia including: °¨ Physical problems. °¨ Mental problems. °¨ Chemical problems (other than drugs). °Testing may be done to determine if there is a psychiatric disability present that can be treated with medicine. °TREATMENT  °· Paranoia that is a symptom of a psychiatric problem should be treated by professionals. °· Medicines are available which can help this disorder. Antipsychotic medicine may be prescribed by your caregiver. °· Sometimes psychotherapy may be useful. °· Conditions such as depression or drug abuse are treated individually. If the paranoia is caused by drug abuse, a treatment facility may be helpful. Depression may be helped by antidepressants. °PROGNOSIS  °· Paranoid people are difficult to treat because of their belief that everyone is out to get them or harm them. Because of this mistrust, they often must be talked into entering treatment by a trusted family member or friend. They may not want to take medicine as they may see this as an attempt to poison them. °· Gradual gains in the trust of a therapist or caregiver helps in a successful treatment plan. °· Some people with PPD or persecutory delusional disorder function in society without treatment in limited fashion. °Document Released: 04/18/2003 Document Revised: 07/08/2011 Document Reviewed: 12/22/2007 °Exit[employer]® Patient Information ©2015 [employer]. This information is not intended to replace advice given to you by your health care provider. Make sure you discuss any questions you have with your health care provider. ° °

## 2014-05-28 NOTE — BH Assessment (Signed)
Received call from Venetia NightSharon Moore, RN saying that Pt does not want inpatient treatment and Dr. Ranae PalmsYelverton is requesting another TTS assessment to determine if Pt meets IVC criteria or can be discharged home. Tele-assessment will be initiated.  Harlin RainFord Ellis Ria CommentWarrick Jr, LPC, [facility] Triage Specialist [phone]

## 2014-05-28 NOTE — ED Notes (Signed)
Pt aware that he will be staying here for tonight, admission/placement  Pending.

## 2014-06-28 ENCOUNTER — Inpatient Hospital Stay (HOSPITAL_COMMUNITY)
Admission: RE | Admit: 2014-06-28 | Discharge: 2014-07-07 | DRG: 885 | Disposition: A | Discharge status: Home / Self Care | Payer: Medicare Other | Attending: Psychiatry | Admitting: Psychiatry

## 2014-06-28 ENCOUNTER — Encounter (HOSPITAL_COMMUNITY): Payer: Self-pay | Admitting: *Deleted

## 2014-06-28 ENCOUNTER — Emergency Department (HOSPITAL_COMMUNITY)
Admission: EM | Admit: 2014-06-28 | Discharge: 2014-06-28 | Disposition: A | Discharge status: Home / Self Care | Payer: Medicare Other | Source: Home / Self Care | Attending: Emergency Medicine | Admitting: Emergency Medicine

## 2014-06-28 DIAGNOSIS — F22 Delusional disorders: Secondary | ICD-10-CM | POA: Diagnosis present

## 2014-06-28 DIAGNOSIS — F411 Generalized anxiety disorder: Secondary | ICD-10-CM | POA: Diagnosis present

## 2014-06-28 DIAGNOSIS — Z720 Tobacco use: Secondary | ICD-10-CM

## 2014-06-28 DIAGNOSIS — F17210 Nicotine dependence, cigarettes, uncomplicated: Secondary | ICD-10-CM | POA: Diagnosis present

## 2014-06-28 DIAGNOSIS — E785 Hyperlipidemia, unspecified: Secondary | ICD-10-CM | POA: Diagnosis present

## 2014-06-28 DIAGNOSIS — F200 Paranoid schizophrenia: Secondary | ICD-10-CM | POA: Insufficient documentation

## 2014-06-28 DIAGNOSIS — Z792 Long term (current) use of antibiotics: Secondary | ICD-10-CM

## 2014-06-28 DIAGNOSIS — F329 Major depressive disorder, single episode, unspecified: Secondary | ICD-10-CM | POA: Diagnosis present

## 2014-06-28 DIAGNOSIS — F319 Bipolar disorder, unspecified: Secondary | ICD-10-CM

## 2014-06-28 DIAGNOSIS — Z79899 Other long term (current) drug therapy: Secondary | ICD-10-CM | POA: Insufficient documentation

## 2014-06-28 DIAGNOSIS — F17200 Nicotine dependence, unspecified, uncomplicated: Secondary | ICD-10-CM | POA: Insufficient documentation

## 2014-06-28 DIAGNOSIS — F250 Schizoaffective disorder, bipolar type: Principal | ICD-10-CM | POA: Diagnosis present

## 2014-06-28 LAB — COMPREHENSIVE METABOLIC PANEL
ALT: 37 U/L (ref 0–53)
ANION GAP: 9 (ref 5–15)
AST: 38 U/L — ABNORMAL HIGH (ref 0–37)
Albumin: 4.3 g/dL (ref 3.5–5.2)
Alkaline Phosphatase: 72 U/L (ref 39–117)
BUN: 10 mg/dL (ref 6–23)
CALCIUM: 9 mg/dL (ref 8.4–10.5)
CHLORIDE: 107 mmol/L (ref 96–112)
CO2: 24 mmol/L (ref 19–32)
Creatinine, Ser: 0.95 mg/dL (ref 0.50–1.35)
GFR calc non Af Amer: >90 mL/min (ref >90)
GLUCOSE: 110 mg/dL — AB (ref 70–99)
Potassium: 4 mmol/L (ref 3.5–5.1)
SODIUM: 140 mmol/L (ref 135–145)
Total Bilirubin: 0.6 mg/dL (ref 0.3–1.2)
Total Protein: 7.5 g/dL (ref 6.0–8.3)

## 2014-06-28 LAB — CBC WITH DIFFERENTIAL/PLATELET
BASOS ABS: 0.1 10*3/uL (ref 0.0–0.1)
BASOS PCT: 1 % (ref 0–1)
Eosinophils Absolute: 0.3 10*3/uL (ref 0.0–0.7)
Eosinophils Relative: 3 % (ref 0–5)
HCT: 45.2 % (ref 39.0–52.0)
HEMOGLOBIN: 15.4 g/dL (ref 13.0–17.0)
Lymphocytes Relative: 23 % (ref 12–46)
Lymphs Abs: 2.6 10*3/uL (ref 0.7–4.0)
MCH: 30.4 pg (ref 26.0–34.0)
MCHC: 34.1 g/dL (ref 30.0–36.0)
MCV: 89.2 fL (ref 78.0–100.0)
MONOS PCT: 4 % (ref 3–12)
Monocytes Absolute: 0.5 10*3/uL (ref 0.1–1.0)
NEUTROS PCT: 69 % (ref 43–77)
Neutro Abs: 8.2 10*3/uL — ABNORMAL HIGH (ref 1.7–7.7)
Platelets: 325 10*3/uL (ref 150–400)
RBC: 5.07 MIL/uL (ref 4.22–5.81)
RDW: 12.6 % (ref 11.5–15.5)
WBC: 11.7 10*3/uL — ABNORMAL HIGH (ref 4.0–10.5)

## 2014-06-28 LAB — ETHANOL

## 2014-06-28 LAB — RAPID URINE DRUG SCREEN, HOSP PERFORMED
AMPHETAMINES: NOT DETECTED
Barbiturates: NOT DETECTED
Benzodiazepines: NOT DETECTED
Cocaine: NOT DETECTED
OPIATES: NOT DETECTED
TETRAHYDROCANNABINOL: NOT DETECTED

## 2014-06-28 MED ORDER — ZIPRASIDONE MESYLATE 20 MG IM SOLR: 20.0000 mg | INTRAMUSCULAR | Status: DC | PRN

## 2014-06-28 MED ORDER — RISPERIDONE 2 MG PO TBDP
2.0000 mg | ORAL_TABLET | Freq: Three times a day (TID) | ORAL | Status: DC | PRN
  Administered 2014-06-28 – 2014-07-05 (×7): 2 mg via ORAL
  Filled 2014-06-28 (×7): qty 2

## 2014-06-28 MED ORDER — ACETAMINOPHEN 325 MG PO TABS
650.0000 mg | ORAL_TABLET | Freq: Four times a day (QID) | ORAL | Status: DC | PRN
Start: 2014-06-28 — End: 2014-07-07
  Administered 2014-06-29 – 2014-07-06 (×9): 650 mg via ORAL
  Filled 2014-06-28 (×9): qty 2

## 2014-06-28 MED ORDER — TRAZODONE HCL 150 MG PO TABS
150.0000 mg | ORAL_TABLET | Freq: Every day | ORAL | Status: DC
  Administered 2014-06-28 – 2014-07-03 (×6): 150 mg via ORAL
  Filled 2014-06-28 (×8): qty 1

## 2014-06-28 MED ORDER — ADULT MULTIVITAMIN W/MINERALS CH
1.0000 | ORAL_TABLET | Freq: Every day | ORAL | Status: DC
  Administered 2014-06-29 – 2014-07-07 (×9): 1 via ORAL
  Filled 2014-06-28 (×11): qty 1

## 2014-06-28 MED ORDER — BENZTROPINE MESYLATE 1 MG PO TABS
1.0000 mg | ORAL_TABLET | Freq: Two times a day (BID) | ORAL | Status: DC
  Administered 2014-06-28 – 2014-06-29 (×2): 1 mg via ORAL
  Filled 2014-06-28 (×5): qty 1

## 2014-06-28 MED ORDER — LORAZEPAM 1 MG PO TABS
1.0000 mg | ORAL_TABLET | ORAL | Status: AC | PRN
  Administered 2014-06-28: 1 mg via ORAL
  Filled 2014-06-28: qty 1

## 2014-06-28 MED ORDER — MAGNESIUM HYDROXIDE 400 MG/5ML PO SUSP: 30.0000 mL | Freq: Every day | ORAL | Status: DC | PRN

## 2014-06-28 MED ORDER — HYDROXYZINE HCL 25 MG PO TABS
25.0000 mg | ORAL_TABLET | Freq: Four times a day (QID) | ORAL | Status: DC | PRN
  Administered 2014-06-29 – 2014-07-05 (×3): 25 mg via ORAL
  Filled 2014-06-28 (×4): qty 1

## 2014-06-28 MED ORDER — NICOTINE 21 MG/24HR TD PT24
21.0000 mg | MEDICATED_PATCH | Freq: Every day | TRANSDERMAL | Status: DC
  Administered 2014-06-29 – 2014-07-03 (×5): 21 mg via TRANSDERMAL
  Filled 2014-06-28 (×11): qty 1

## 2014-06-28 MED ORDER — ALUM & MAG HYDROXIDE-SIMETH 200-200-20 MG/5ML PO SUSP
30.0000 mL | ORAL | Status: DC | PRN
  Administered 2014-06-30 – 2014-07-06 (×7): 30 mL via ORAL
  Filled 2014-06-28 (×7): qty 30

## 2014-06-28 MED ORDER — ZIPRASIDONE HCL 20 MG PO CAPS
20.0000 mg | ORAL_CAPSULE | Freq: Once | ORAL | Status: AC
  Administered 2014-06-29: 20 mg via ORAL
  Filled 2014-06-28 (×2): qty 1

## 2014-06-28 NOTE — ED Notes (Signed)
Per Deliah BostonVrinda NP, pt is medically cleared even thought Urine Drug screen is pending, so ok for me to call [facility].   Made [facility] aware of transport.
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
## 2014-06-28 NOTE — Tx Team (Signed)
Initial Interdisciplinary Treatment Plan   PATIENT STRESSORS: Marital or family conflict   PATIENT STRENGTHS: Capable of independent living General fund of knowledge Supportive family/friends   PROBLEM LIST: Problem List/Patient Goals Date to be addressed Date deferred Reason deferred Estimated date of resolution  " my mama said i need help with my anger" 06/28/2014                                                      DISCHARGE CRITERIA:  Improved stabilization in mood, thinking, and/or behavior Need for constant or close observation no longer present  PRELIMINARY DISCHARGE PLAN: Return to previous living arrangement  PATIENT/FAMIILY INVOLVEMENT: This treatment plan has been presented to and reviewed with the patient, Nichola SizerBrian T Casa.  The patient and family have been given the opportunity to ask questions and make suggestions.  Leighton Parodyyson, Maize Brittingham M 06/28/2014, 7:03 PM

## 2014-06-28 NOTE — BH Assessment (Signed)
Pt accepted [facility] by Renata Capriceonrad, NP to Dr. Jettie BoozeEeapen to [phone] and can come after 7 pm. Nursing report [phone]. Support paperwork completed.
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
## 2014-06-28 NOTE — Progress Notes (Signed)
Adult Psychoeducational Group Note  Date:  06/28/2014 Time:  8:46 PM  Group Topic/Focus:  Wrap-Up Group:   The focus of this group is to help patients review their daily goal of treatment and discuss progress on daily workbooks.  Participation Level:  Active  Participation Quality:  Appropriate  Affect:  Appropriate  Cognitive:  Appropriate  Insight: Appropriate  Engagement in Group:  Engaged  Modes of Intervention:  Discussion  Additional Comments:The patient expressed that he got angry but the situation got better.The patient also said that his day was good.  Octavio Mannshigpen, Mychal Durio Lee 06/28/2014, 8:46 PM

## 2014-06-28 NOTE — Progress Notes (Signed)
Patient arrived at the unit at about shift change. He appeared manic and pleasantly psychotic. He was rapping and pacing back and forth the hallway. He stated " I love music , but I focus on rap right now". He denied SI/HI and denied Hallucinations but seemed to be responding to internal stimuli. Patient said he used to hear voices but none during the assessment. Patient only had regular medication ordered at this time, no anti- psychotic medication yet. Writer asked patient what medications he takes at home. He said he takes Lamictal , Abilify and Cogentin. He prefers the brand name cogentin to the generic. Pa not on the unit yet, writer told the admission RN to follow up on placing order for anti- psychotic medications for the patient. Q 15 minute check continues as ordered to maintain safety.
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
## 2014-06-29 ENCOUNTER — Encounter (HOSPITAL_COMMUNITY): Payer: Self-pay | Admitting: Psychiatry

## 2014-06-29 DIAGNOSIS — F250 Schizoaffective disorder, bipolar type: Principal | ICD-10-CM | POA: Diagnosis present

## 2014-06-29 MED ORDER — NICOTINE POLACRILEX 2 MG MT GUM
2.0000 mg | CHEWING_GUM | OROMUCOSAL | Status: DC | PRN
  Administered 2014-06-29 – 2014-07-04 (×3): 2 mg via ORAL
  Filled 2014-06-29: qty 1

## 2014-06-29 MED ORDER — LAMOTRIGINE 25 MG PO TABS
25.0000 mg | ORAL_TABLET | Freq: Every day | ORAL | Status: DC
  Administered 2014-06-29 – 2014-06-30 (×2): 25 mg via ORAL
  Filled 2014-06-29 (×4): qty 1

## 2014-06-29 MED ORDER — ARIPIPRAZOLE 5 MG PO TABS
5.0000 mg | ORAL_TABLET | Freq: Every day | ORAL | Status: DC
  Administered 2014-06-29 – 2014-06-30 (×2): 5 mg via ORAL
  Filled 2014-06-29 (×4): qty 1

## 2014-06-29 MED ORDER — BENZTROPINE MESYLATE 0.5 MG PO TABS
0.5000 mg | ORAL_TABLET | Freq: Every day | ORAL | Status: DC
  Administered 2014-06-30: 0.5 mg via ORAL
  Filled 2014-06-29 (×2): qty 1
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
## 2014-06-29 NOTE — Progress Notes (Signed)
Patient ID: [name], male   DOB: [birth year], 39 y.o.   MRN: [id number] PER STATE REGULATIONS 482.30  THIS CHART WAS REVIEWED FOR MEDICAL NECESSITY WITH RESPECT TO THE PATIENT'S ADMISSION/DURATION OF STAY.  NEXT REVIEW DATE: 07/02/14  Loura HaltBARBARA Antania Hoefling, RN, BSN CASE MANAGER

## 2014-06-29 NOTE — [facility] Suicide Risk Assessment (Signed)
[facility]   Nursing information obtained from:    Demographic factors:    Current Mental Status:    Loss Factors:    Historical Factors:    Risk Reduction Factors:    Total Time spent with patient: 30 minutes Principal Problem: Schizoaffective disorder, bipolar type Diagnosis:   Patient Active Problem List   Diagnosis Date Noted  . Schizoaffective disorder, bipolar type [F25.0] 06/29/2014     Continued Clinical Symptoms:  Alcohol Use Disorder Identification Test Final Score (AUDIT): 0 The "Alcohol Use Disorders Identification Test", Guidelines for Use in Primary Care, Second Edition.  World Science writerHealth Organization [facility](WHO). Score between 0-7:  no or low risk or alcohol related problems. Score between 8-15:  moderate risk of alcohol related problems. Score between 16-19:  high risk of alcohol related problems. Score 20 or above:  warrants further diagnostic evaluation for alcohol dependence and treatment.   CLINICAL FACTORS:   Previous Psychiatric Diagnoses and Treatments   Musculoskeletal: Strength & Muscle Tone: within normal limits Gait & Station: normal Patient leans: N/A  Psychiatric Specialty Exam: Physical Exam  ROS  Blood pressure 127/80, pulse 94, temperature 98.2 F (36.8 C), temperature source Oral, resp. rate 16, height 5\' 10"  (1.778 m), weight 113.399 kg (250 lb), SpO2 98 %.Body mass index is 35.87 kg/(m^2).                  Please see H&P.                                        COGNITIVE FEATURES THAT CONTRIBUTE TO RISK:  None    SUICIDE RISK:   Mild:  Suicidal ideation of limited frequency, intensity, duration, and specificity.  There are no identifiable plans, no associated intent, mild dysphoria and related symptoms, good self-control (both objective and subjective assessment), few other risk factors, and identifiable protective factors, including available and accessible social support.  PLAN OF CARE:  Please see H&P.   Medical Decision Making:  Review of Psycho-Social Stressors (1), Review or order clinical lab tests (1), Review and summation of old records (2), Established Problem, Worsening (2), Review of Medication Regimen & Side Effects (2) and Review of New Medication or Change in Dosage (2)  I certify that inpatient services furnished can reasonably be expected to improve the patient's condition.   Avika Carbine MD 06/29/2014, 11:20 AM

## 2014-06-29 NOTE — Progress Notes (Signed)
Patient was getting progressively agitated. Pa notified, he ordered a one time dose of oral Geodon. Patient requested for for something to eat. Writer offered patient snacks which he refused; "I want a meal". Patient was given a tray of meal. He ate the meal and went to his room and slept before his Geodon order was verified. Q 15 minute check continues as ordered to maintain safety.

## 2014-06-29 NOTE — H&P (Addendum)
Psychiatric Admission Assessment Adult  Patient Identification: [name] MRN:  [id number] Date of Evaluation:  06/29/2014 Chief Complaint: Patient states " My manager and my mother are getting together to put me in here, I did not do anything , I just went off once and she got me here.'       Principal Diagnosis: Schizoaffective disorder, bipolar type Diagnosis:   Primary Psychiatric Diagnosis: Schizoaffective disorder,Bipolar type     Non Psychiatric Diagnosis: See pmh Patient Active Problem List   Diagnosis Date Noted  . Schizoaffective disorder, bipolar type [F25.0] 06/29/2014           History of Present Illness::[name] is a 39 y.o.  AAmale who presented with his mother due to having some issues at his apartment.  [name] apartment manager is Tory Emerald - [phone] .Attempted to contact her - she said she will call me back. Per initial evaluation pt was upset, crying in the parking lot ,pacing, seemed "out of his head' . Patient also appeared to have paranoia, "men in dreads were in the street and wanted to burn down the streets.' . Pt also was upset about his neighbor taking advantage of a girl who got murdered 2 months ago.  Per initial evaluation notes pt has an ACT team Tomi Bamberger is the contact - [phone].Attempted to contact - went to voice mail. Attempted to call mother Duwaine Maxin - [phone] - no response.  Patient seen this AM. Patient appeared to be quite restless, reports that being here is making him anxious. He reported that he got upset once and ended up here. Patient currently denies any depression, mood swings, AH/VH/HI/SI. Patient reports that his neighbour took advantage of a girl who got murdered 2 months ago and he saw him recently with another girl and this upset him. He states that he just ripped the mail box key and was brought here. This happened only once. Patient reports that he follows up with Dr.Ray of [facility], [place]. Patient  takes Abilify maintena IM , recently got the shot (does not know the exact date). Patient feels good being on Lamictal , but does not know the dose. Patient provided contact information for mother and asked writer to contact her.  Patient with previous admissions at [facility]- 02/2011, 06/2013.Patient also reports admissions to [facility] in the past.  Patient denies any substance abuse other than smoking cigarettes.    Elements:  Location:  mood lability, agitation, psychosis. Quality:  paranoia, aggression, anger issues, restlessness. Severity:  severe. Timing:  regular. Duration:  past 3 days. Context:  hx of schizoaffective disorder. Associated Signs/Symptoms: Depression Symptoms:  psychomotor agitation, anxiety, (Hypo) Manic Symptoms:  Impulsivity, Irritable Mood, Anxiety Symptoms:  unspecified worry Psychotic Symptoms:  Paranoia, PTSD Symptoms: NA Total Time spent with patient: 1 hour  Past Medical History:  Past Medical History  Diagnosis Date  . Paranoid schizophrenia   . Hyperlipidemia   . Schizophrenia   . Bipolar disorder     Past Surgical History  Procedure Laterality Date  . Testicle surgery     Family History:  Family History  Problem Relation Age of Onset  . Schizophrenia Neg Hx    Social History:  History  Alcohol Use No     History  Drug Use No    History   Social History  . Marital Status: Single    Spouse Name: N/A  . Number of Children: N/A  . Years of Education: N/A   Social History Main  Topics  . Smoking status: Current Every Day Smoker -- 1.00 packs/day    Types: Cigarettes  . Smokeless tobacco: Not on file  . Alcohol Use: No  . Drug Use: No  . Sexual Activity: Yes    Birth Control/ Protection: Condom   Other Topics Concern  . None   Social History Narrative   Additional Social History:    Pain Medications: see MAR Prescriptions: see MAR Over the Counter: see MAR History of alcohol / drug use?: No  history of alcohol / drug abuse Longest period of sobriety (when/how long):  (na) Negative Consequences of Use:  (na) Withdrawal Symptoms:  (na)        Patient was born in [place]. Parents raised him until 1st grade. They separated after that and his mother raised him. Patient graduated HS,currently is in [place] doing reading,math,writing. Patient has never been married. Patient is on SSD.Patient is religious.             Musculoskeletal: Strength & Muscle Tone: within normal limits Gait & Station: normal Patient leans: N/A  Psychiatric Specialty Exam: Physical Exam  Constitutional: He is oriented to person, place, and time. He appears well-developed and well-nourished.  HENT:  Head: Normocephalic.  Eyes: Pupils are equal, round, and reactive to light.  Neck: Normal range of motion.  Cardiovascular: Normal rate.   Respiratory: Effort normal.  GI: Soft.  Musculoskeletal: Normal range of motion.  Neurological: He is alert and oriented to person, place, and time.  Skin: Skin is warm.  Psychiatric: His speech is normal. Thought content normal. His mood appears anxious. His affect is labile. He is hyperactive. Cognition and memory are impaired. He expresses impulsivity.    Review of Systems  Constitutional: Negative.   HENT: Negative.   Eyes: Negative.   Respiratory: Negative.   Cardiovascular: Negative.   Gastrointestinal: Negative.   Genitourinary: Negative.   Musculoskeletal: Negative.   Skin: Negative.   Neurological: Negative.   Psychiatric/Behavioral: The patient is nervous/anxious.     Blood pressure 127/80, pulse 94, temperature 98.2 F (36.8 C), temperature source Oral, resp. rate 16, height '5\' 10"'  (1.778 m), weight 113.399 kg (250 lb), SpO2 98 %.Body mass index is 35.87 kg/(m^2).  General Appearance: Fairly Groomed  Engineer, water::  Fair  Speech:  Normal Rate  Volume:  Normal  Mood:  Anxious  Affect:  Labile  Thought Process:  Coherent  Orientation:  Full (Time,  Place, and Person)  Thought Content:  Paranoid Ideation  Suicidal Thoughts:  No  Homicidal Thoughts:  No  Memory:  Immediate;   Fair Recent;   Fair Remote;   Fair  Judgement:  Impaired  Insight:  Fair  Psychomotor Activity:  Restlessness  Concentration:  Fair  Recall:  [employer] of Knowledge:Fair  Language: Fair  Akathisia:  No  Handed:  Right  AIMS (if indicated):     Assets:  Armed forces logistics/support/administrative officer Physical Health Social Support  ADL's:  Intact  Cognition: WNL  Sleep:  Number of Hours: 3   Risk to Self: Suicidal Ideation: No Suicidal Intent: No Is patient at risk for suicide?: No Suicidal Plan?: No Access to Means: No What has been your use of drugs/alcohol within the last 12 months?: na - pt denies How many times?: 0 Other Self Harm Risks: na-pt denies Triggers for Past Attempts: None known Intentional Self Injurious Behavior: None Risk to Others: Homicidal Ideation: No Thoughts of Harm to Others: No Current Homicidal Intent: No Current Homicidal Plan: No Access  to Homicidal Means: No Identified Victim: na - pt denies History of harm to others?: No Assessment of Violence: On admission Violent Behavior Description: pt ripped keys out of his mail box because he was angry Does patient have access to weapons?: No Criminal Charges Pending?: No Does patient have a court date: No Prior Inpatient Therapy: Prior Inpatient Therapy: Yes Prior Therapy Dates: 06/2013 and multiple other dates Prior Therapy Facilty/Provider(s): [facility], [place], [place] Reason for Treatment: Schizophrenia Prior Outpatient Therapy: Prior Outpatient Therapy: Yes Prior Therapy Dates: Ongoing Prior Therapy Facilty/Provider(s): [facility] Reason for Treatment: Med mgnt/ACT team  Alcohol Screening: 1. How often do you have a drink containing alcohol?: Never 9. Have you or someone else been injured as a result of your drinking?: No 10. Has a relative or friend or a doctor or another health worker been concerned  about your drinking or suggested you cut down?: No Alcohol Use Disorder Identification Test Final Score (AUDIT): 0  Allergies:  No Known Allergies Lab Results:  Results for orders placed or performed during the hospital encounter of 06/28/14 (from the past 48 hour(s))  Comprehensive metabolic panel     Status: Abnormal   Collection Time: 06/28/14  4:20 PM  Result Value Ref Range   Sodium 140 135 - 145 mmol/L   Potassium 4.0 3.5 - 5.1 mmol/L   Chloride 107 96 - 112 mmol/L   CO2 24 19 - 32 mmol/L   Glucose, Bld 110 (H) 70 - 99 mg/dL   BUN 10 6 - 23 mg/dL   Creatinine, Ser 0.95 0.50 - 1.35 mg/dL   Calcium 9.0 8.4 - 10.5 mg/dL   Total Protein 7.5 6.0 - 8.3 g/dL   Albumin 4.3 3.5 - 5.2 g/dL   AST 38 (H) 0 - 37 U/L   ALT 37 0 - 53 U/L   Alkaline Phosphatase 72 39 - 117 U/L   Total Bilirubin 0.6 0.3 - 1.2 mg/dL   GFR calc non Af Amer >90 >90 mL/min   GFR calc Af Amer >90 >90 mL/min    Comment: (NOTE) The eGFR has been calculated using the CKD EPI equation. This calculation has not been validated in all clinical situations. eGFR's persistently <90 mL/min signify possible Chronic Kidney Disease.    Anion gap 9 5 - 15  CBC with Differential     Status: Abnormal   Collection Time: 06/28/14  4:20 PM  Result Value Ref Range   WBC 11.7 (H) 4.0 - 10.5 K/uL   RBC 5.07 4.22 - 5.81 MIL/uL   Hemoglobin 15.4 13.0 - 17.0 g/dL   HCT 45.2 39.0 - 52.0 %   MCV 89.2 78.0 - 100.0 fL   MCH 30.4 26.0 - 34.0 pg   MCHC 34.1 30.0 - 36.0 g/dL   RDW 12.6 11.5 - 15.5 %   Platelets 325 150 - 400 K/uL   Neutrophils Relative % 69 43 - 77 %   Neutro Abs 8.2 (H) 1.7 - 7.7 K/uL   Lymphocytes Relative 23 12 - 46 %   Lymphs Abs 2.6 0.7 - 4.0 K/uL   Monocytes Relative 4 3 - 12 %   Monocytes Absolute 0.5 0.1 - 1.0 K/uL   Eosinophils Relative 3 0 - 5 %   Eosinophils Absolute 0.3 0.0 - 0.7 K/uL   Basophils Relative 1 0 - 1 %   Basophils Absolute 0.1 0.0 - 0.1 K/uL  Ethanol     Status: None   Collection Time:  06/28/14  4:20 PM  Result  Value Ref Range   Alcohol, Ethyl (B) <5 0 - 9 mg/dL    Comment:        LOWEST DETECTABLE LIMIT FOR SERUM ALCOHOL IS 11 mg/dL FOR MEDICAL PURPOSES ONLY   Urine rapid drug screen (hosp performed)     Status: None   Collection Time: 06/28/14  4:59 PM  Result Value Ref Range   Opiates NONE DETECTED NONE DETECTED   Cocaine NONE DETECTED NONE DETECTED   Benzodiazepines NONE DETECTED NONE DETECTED   Amphetamines NONE DETECTED NONE DETECTED   Tetrahydrocannabinol NONE DETECTED NONE DETECTED   Barbiturates NONE DETECTED NONE DETECTED    Comment:        DRUG SCREEN FOR MEDICAL PURPOSES ONLY.  IF CONFIRMATION IS NEEDED FOR ANY PURPOSE, NOTIFY LAB WITHIN 5 DAYS.        LOWEST DETECTABLE LIMITS FOR URINE DRUG SCREEN Drug Class       Cutoff (ng/mL) Amphetamine      1000 Barbiturate      200 Benzodiazepine   810 Tricyclics       175 Opiates          300 Cocaine          300 THC              50    Current Medications: Current Facility-Administered Medications  Medication Dose Route Frequency Provider Last Rate Last Dose  . acetaminophen (TYLENOL) tablet 650 mg  650 mg Oral Q6H PRN Encarnacion Slates, NP   650 mg at 06/29/14 0544  . alum & mag hydroxide-simeth (MAALOX/MYLANTA) 200-200-20 MG/5ML suspension 30 mL  30 mL Oral Q4H PRN Encarnacion Slates, NP      . ARIPiprazole (ABILIFY) tablet 5 mg  5 mg Oral Daily Maclin Guerrette, MD      . benztropine (COGENTIN) tablet 1 mg  1 mg Oral BID Laverle Hobby, PA-C   1 mg at 06/29/14 0754  . hydrOXYzine (ATARAX/VISTARIL) tablet 25 mg  25 mg Oral Q6H PRN Encarnacion Slates, NP      . lamoTRIgine (LAMICTAL) tablet 25 mg  25 mg Oral Daily Ekam Besson, MD      . magnesium hydroxide (MILK OF MAGNESIA) suspension 30 mL  30 mL Oral Daily PRN Encarnacion Slates, NP      . multivitamin with minerals tablet 1 tablet  1 tablet Oral Daily Encarnacion Slates, NP   1 tablet at 06/29/14 0754  . nicotine (NICODERM CQ - dosed in mg/24 hours) patch 21 mg  21  mg Transdermal Q0600 Encarnacion Slates, NP   21 mg at 06/29/14 0600  . risperiDONE (RISPERDAL M-TABS) disintegrating tablet 2 mg  2 mg Oral Q8H PRN Laverle Hobby, PA-C   2 mg at 06/28/14 2055   And  . ziprasidone (GEODON) injection 20 mg  20 mg Intramuscular PRN Laverle Hobby, PA-C      . traZODone (DESYREL) tablet 150 mg  150 mg Oral QHS Encarnacion Slates, NP   150 mg at 06/28/14 2116   PTA Medications: Prescriptions prior to admission  Medication Sig Dispense Refill Last Dose  . ARIPiprazole (ABILIFY MAINTENA) 400 MG SUSR Inject 400 mg into the muscle every 28 (twenty-eight) days. Patient received first dose on 07/29/13, will be due again in 28 days. 1 each 3 Past Month at Unknown time  . ARIPiprazole (ABILIFY) 20 MG tablet Take 20 mg by mouth daily.   06/28/2014 at Unknown time  . ARIPiprazole (ABILIFY)  5 MG tablet Take 5 mg by mouth daily.   06/28/2014 at Unknown time  . atorvastatin (LIPITOR) 20 MG tablet Take 1 tablet (20 mg total) by mouth daily.   Past Week at Unknown time  . benztropine (COGENTIN) 1 MG tablet Take 1 tablet (1 mg total) by mouth 2 (two) times daily. 60 tablet 0 Past Week at Unknown time  . doxycycline (VIBRA-TABS) 100 MG tablet Take 100 mg by mouth 2 (two) times daily.   06/28/2014 at Unknown time  . ibuprofen (ADVIL,MOTRIN) 800 MG tablet Take 800 mg by mouth every 8 (eight) hours as needed for mild pain or moderate pain (pain).    06/27/2014 at Unknown time  . lamoTRIgine (LAMICTAL) 25 MG tablet Take 50 mg by mouth daily.   06/28/2014 at 1130  . lithium carbonate 300 MG capsule      . Multiple Vitamin (MULTIVITAMIN WITH MINERALS) TABS tablet Take 1 tablet by mouth daily.   unknown at unknown time  . traZODone (DESYREL) 150 MG tablet Take 1 tablet (150 mg total) by mouth at bedtime. (Patient taking differently: Take 150 mg by mouth daily. ) 30 tablet 0 06/27/2014 at Unknown time    Previous Psychotropic Medications: Yes , Haldol, Haldol decanoate, Depakote, Risperdal  Substance Abuse  History in the last 12 months:  No.    Consequences of Substance Abuse: NA  Results for orders placed or performed during the hospital encounter of 06/28/14 (from the past 72 hour(s))  Comprehensive metabolic panel     Status: Abnormal   Collection Time: 06/28/14  4:20 PM  Result Value Ref Range   Sodium 140 135 - 145 mmol/L   Potassium 4.0 3.5 - 5.1 mmol/L   Chloride 107 96 - 112 mmol/L   CO2 24 19 - 32 mmol/L   Glucose, Bld 110 (H) 70 - 99 mg/dL   BUN 10 6 - 23 mg/dL   Creatinine, Ser 0.95 0.50 - 1.35 mg/dL   Calcium 9.0 8.4 - 10.5 mg/dL   Total Protein 7.5 6.0 - 8.3 g/dL   Albumin 4.3 3.5 - 5.2 g/dL   AST 38 (H) 0 - 37 U/L   ALT 37 0 - 53 U/L   Alkaline Phosphatase 72 39 - 117 U/L   Total Bilirubin 0.6 0.3 - 1.2 mg/dL   GFR calc non Af Amer >90 >90 mL/min   GFR calc Af Amer >90 >90 mL/min    Comment: (NOTE) The eGFR has been calculated using the CKD EPI equation. This calculation has not been validated in all clinical situations. eGFR's persistently <90 mL/min signify possible Chronic Kidney Disease.    Anion gap 9 5 - 15  CBC with Differential     Status: Abnormal   Collection Time: 06/28/14  4:20 PM  Result Value Ref Range   WBC 11.7 (H) 4.0 - 10.5 K/uL   RBC 5.07 4.22 - 5.81 MIL/uL   Hemoglobin 15.4 13.0 - 17.0 g/dL   HCT 45.2 39.0 - 52.0 %   MCV 89.2 78.0 - 100.0 fL   MCH 30.4 26.0 - 34.0 pg   MCHC 34.1 30.0 - 36.0 g/dL   RDW 12.6 11.5 - 15.5 %   Platelets 325 150 - 400 K/uL   Neutrophils Relative % 69 43 - 77 %   Neutro Abs 8.2 (H) 1.7 - 7.7 K/uL   Lymphocytes Relative 23 12 - 46 %   Lymphs Abs 2.6 0.7 - 4.0 K/uL   Monocytes Relative 4 3 - 12 %  Monocytes Absolute 0.5 0.1 - 1.0 K/uL   Eosinophils Relative 3 0 - 5 %   Eosinophils Absolute 0.3 0.0 - 0.7 K/uL   Basophils Relative 1 0 - 1 %   Basophils Absolute 0.1 0.0 - 0.1 K/uL  Ethanol     Status: None   Collection Time: 06/28/14  4:20 PM  Result Value Ref Range   Alcohol, Ethyl (B) <5 0 - 9 mg/dL     Comment:        LOWEST DETECTABLE LIMIT FOR SERUM ALCOHOL IS 11 mg/dL FOR MEDICAL PURPOSES ONLY   Urine rapid drug screen (hosp performed)     Status: None   Collection Time: 06/28/14  4:59 PM  Result Value Ref Range   Opiates NONE DETECTED NONE DETECTED   Cocaine NONE DETECTED NONE DETECTED   Benzodiazepines NONE DETECTED NONE DETECTED   Amphetamines NONE DETECTED NONE DETECTED   Tetrahydrocannabinol NONE DETECTED NONE DETECTED   Barbiturates NONE DETECTED NONE DETECTED    Comment:        DRUG SCREEN FOR MEDICAL PURPOSES ONLY.  IF CONFIRMATION IS NEEDED FOR ANY PURPOSE, NOTIFY LAB WITHIN 5 DAYS.        LOWEST DETECTABLE LIMITS FOR URINE DRUG SCREEN Drug Class       Cutoff (ng/mL) Amphetamine      1000 Barbiturate      200 Benzodiazepine   370 Tricyclics       964 Opiates          300 Cocaine          300 THC              50     Observation Level/Precautions:  15 minute checks  Laboratory:  hba1c,lipid panel,TSH,ekg if not already done  Psychotherapy:  INDIVIDUAL AND GROUP  Medications:  As needed  Consultations: Social worker   Discharge Concerns:Stability and safety         Psychological Evaluations: No   Treatment Plan Summary: Daily contact with patient to assess and evaluate symptoms and progress in treatment and Medication management  Patient will benefit from inpatient treatment and stabilization.  Estimated length of stay is 5-7 days.  Reviewed past medical records,treatment plan. Will get records from [facility].Attempted to contact them ,no response. Will start Abilify 5 mg po. Patient reports receiving Abilify Maintena recently - needs to be verified. Patient reports good effect from it. Will start Lamictal 25 mg po daily. Patient reports good effect from Lamictal. Will make available prn medications for agitation/anxiety. Will continue to monitor vitals ,medication compliance and treatment side effects while patient is here.  Will monitor for medical issues  as well as call consult as needed.  Reviewed labs ,will order as needed.  CSW will start working on disposition.  Patient to participate in therapeutic milieu .       Medical Decision Making:  Review of Psycho-Social Stressors (1), Review or order clinical lab tests (1), Decision to obtain old records (1), Established Problem, Worsening (2), Review of Last Therapy Session (1), Review or order medicine tests (1), Review of Medication Regimen & Side Effects (2) and Review of New Medication or Change in Dosage (2)  I certify that inpatient services furnished can reasonably be expected to improve the patient's condition.   Aaryn Sermon MD 3/2/201611:22 AM   Tory Emerald called writer back - reported that patient has not been stable for quiet some time. He was taken to the local ED ,but always was discharged home. Patient  has been very paranoid , talking about things that did not make sense like being in a relationship with a girl and this girl being murdered and so on. Patient was living in this apartment by [employer] . Pt did go to his recent mental health appointment. But could not give medication details. Asked to contact Tomi Bamberger with [facility] at [phone].  Ursula Alert ,MD 06/29/14 2:53 PM

## 2014-06-29 NOTE — Progress Notes (Signed)
D: Patient appropriate to situation and mood is anxious. He's been pacing up and down the hallway; at times he will yell out a rap song. Patient reported on the self inventory sheet that both his sleep and appetite are good, normal energy level and poor ability to concentrate. He rates depression, feelings of hopelessness and anxiety "0". Patient is actively participating in groups and interactive with peers in the dayroom. Adheres to medication regimen.  A: Support and encouragement provided to patient. Administered medications per ordering MD. Monitor Q15 minute checks for safety.  R: Patient receptive. Denies SI/HI and AVH. Patient remains safe on the unit.

## 2014-06-29 NOTE — Tx Team (Signed)
Interdisciplinary Treatment Plan Update (Adult)  Date: 06/29/2014 Time Reviewed: 11:38 AM  Progress in Treatment:  Attending groups: Yes  Participating in groups:  Yes  Taking medication as prescribed: Yes  Tolerating medication: Yes  Family/Significant othe contact made:No, not yet  Patient understands diagnosis: No  Limited insight  Discussing patient identified problems/goals with staff: Yes  Medical problems stabilized or resolved: Yes  Denies suicidal/homicidal ideation: Yes  Patient has not harmed self or Others: Yes  New problem(s) identified: N/A Discharge Plan or Barriers: Pt plans to return home and receive outpatient services.  Additional comments:[name] is an [age] y.o. male that presented today with his mother due to recommendation of pt's apartment manager. Freddie BreechLinda Wilson - [phone] [facility] pt's [facility] because pt was upset, crying in the parking lot, pacing and was "out of his head" Pt has a hx of diagnosis of Schizophrenia. Pt is reporting paranoia, stating "men in dreads were in the street last night, so I thought I needed to burn the street up." Pt stated he got upset today at his mail box and ripped the keys out because he thought his neighbor was "taking advantage of the girl that got murdered." Pt stated his mother is controlling him and "watching my every move." She is pt's payee.  Pt and CSW Intern reviewed pt's identified goals and treatment plan. Pt verbalized understanding and agreed to treatment plan  Reason for Continuation of Hospitalization:  Paranoia  Medication Stabilization   Estimated length of stay: 4-5 days   Attendees:  Patient:  06/29/2014 11:38 AM   Family:  3/2/201611:38 AM   Physician: Dr. Elna BreslowEappen MD  3/2/201611:38 AM  Nursing: Harold Barbanonecia Byrd, RN  06/29/2014 11:38 AM  Clinical Social Worker: Daryel Geraldodney Fintan Grater, [place]  3/2/201611:38 AM  Clinical Social Worker: Charleston Ropesandace Hyatt, CSW Intern 3/2/201611:38 AM  Other:  3/2/201611:38 AM   Other:  3/2/201611:38 AM  Other:  3/2/201611:38 AM  Scribe for Treatment Team:  Charleston Ropesandace Hyatt, CSW Intern 06/29/2014 11:38 AM

## 2014-06-29 NOTE — [facility] Group Notes (Signed)
[facility]   06/29/2014 3:08 PM  Participation Quality:  Minimal  Mood/Affect:  Flat  Depression Rating:  denies  Anxiety Rating: denies   Thoughts of Suicide:  No Will you contract for safety?   NA  Current AVH:  Yes  Plan for Discharge/Comments:  Arlys JohnBrian thinks he was here a year or 2 ago, and remembers me.  He admits that he got upset with a guy who's mail keeps coming to his address, and so he took his anger out on the mailbox when he received yet another piece of mail addressed to this person.  Says he has been living in the same apartment for 3 years.  Was called out to see the Dr.  [employer] Incorporatedransportation Means: family  Supports: family  Kiribatiorth, Baldo DaubRodney B

## 2014-06-29 NOTE — Progress Notes (Signed)
Patient appeared very agitated at the beginning of the shift. He was angry, wrapping, coursing and threatening to hurt someone. He said he wanted to get out of the unit. Writer calmly called patient to the medication window and told him he had some medications that were due. Writer gave patient 2mg  Risperdal M-tab and Vistaril. Offered patient ginger ale or Gatorade, asked his if he needed anything to eat. Patient said he requested for snack earlier and he wasn't given and that he has been asking for nicorette gum. Writer offered nicorette gum and snack. Patient seemed calm immediately and went to day room and sat down. He  Later attended group and he is doing better about an hour after the episode of severe agitation. Q 15 minute check continues as ordered to maintain safety .

## 2014-06-29 NOTE — [facility] Group Notes (Signed)
[facility] LCSW Group Therapy  06/29/2014 1:27 PM  Type of Therapy:  Group Therapy  Participation Level:  None  Participation Quality:  Drowsy  Affect:  Flat  Cognitive:  Lacking  Insight:  Limited  Engagement in Therapy:  Limited  Modes of Intervention:  Discussion, Education, Socialization and Support  Summary of Progress/Problems:Mental [employer] (MHA) speaker came to talk about his personal journey with substance abuse and mental illness. Group members were challenged to process ways by which to relate to the speaker. MHA speaker provided handouts and educational information pertaining to groups and services offered by [facility]. Arlys JohnBrian attended group and stayed the entire time. However, he slept throughout most of group.     Hyatt,Candace 06/29/2014, 1:27 PM

## 2014-06-30 DIAGNOSIS — Z720 Tobacco use: Secondary | ICD-10-CM

## 2014-06-30 MED ORDER — BENZTROPINE MESYLATE 0.5 MG PO TABS
0.5000 mg | ORAL_TABLET | Freq: Every day | ORAL | Status: DC
  Filled 2014-06-30: qty 1

## 2014-06-30 MED ORDER — NON FORMULARY: 400.0000 mg | Status: DC

## 2014-06-30 MED ORDER — ARIPIPRAZOLE 10 MG PO TABS
20.0000 mg | ORAL_TABLET | Freq: Once | ORAL | Status: AC
  Administered 2014-06-30: 20 mg via ORAL
  Filled 2014-06-30: qty 2

## 2014-06-30 MED ORDER — ARIPIPRAZOLE 15 MG PO TABS: 25.0000 mg | ORAL_TABLET | Freq: Every evening | ORAL | Status: DC

## 2014-06-30 MED ORDER — ARIPIPRAZOLE 15 MG PO TABS: 25.0000 mg | ORAL_TABLET | Freq: Every day | ORAL | Status: DC

## 2014-06-30 MED ORDER — LAMOTRIGINE 25 MG PO TABS
25.0000 mg | ORAL_TABLET | ORAL | Status: DC
  Administered 2014-06-30 – 2014-07-07 (×14): 25 mg via ORAL
  Filled 2014-06-30 (×17): qty 1

## 2014-06-30 MED ORDER — ARIPIPRAZOLE 15 MG PO TABS
25.0000 mg | ORAL_TABLET | Freq: Every evening | ORAL | Status: DC
  Filled 2014-06-30: qty 1

## 2014-06-30 MED ORDER — ARIPIPRAZOLE ER 400 MG IM SUSR
400.0000 mg | Freq: Once | INTRAMUSCULAR | Status: AC
  Administered 2014-06-30: 400 mg via INTRAMUSCULAR
  Filled 2014-06-30: qty 400

## 2014-06-30 MED ORDER — LITHIUM CARBONATE 300 MG PO CAPS
600.0000 mg | ORAL_CAPSULE | Freq: Every day | ORAL | Status: DC
  Filled 2014-06-30 (×2): qty 2

## 2014-06-30 NOTE — Plan of Care (Signed)
Problem: Ineffective individual coping Goal: STG: Patient will remain free from self harm Outcome: Progressing Patient remains free from self harm. Pt denies SI. 15 minute checks continued per protocol for patient safety.   Problem: Diagnosis: Increased Risk For Suicide Attempt Goal: STG-Patient Will Attend All Groups On The Unit Outcome: Progressing Patient is attending unit groups today.  Problem: Aggression Towards others,Towards Self, and or Destruction Goal: STG-Patient will comply with prescribed medication regimen (Patient will comply with prescribed medication regimen)  Outcome: Progressing Patient has adhered to medication regimen today with ease.

## 2014-06-30 NOTE — [facility] Group Notes (Signed)
[facility] Group Notes:  (Counselor/Nursing/MHT/Case Management/Adjunct)  06/30/2014 1:15PM  Type of Therapy:  Group Therapy  Participation Level:  Active  Participation Quality:  Appropriate  Affect:  Flat  Cognitive:  Oriented  Insight:  Improving  Engagement in Group:  Limited  Engagement in Therapy:  Limited  Modes of Intervention:  Discussion, Exploration and Socialization  Summary of Progress/Problems: The topic for group was balance in life.  Pt participated in the discussion about when their life was in balance and out of balance and how this feels.  Pt discussed ways to get back in balance and short term goals they can work on to get where they want to be. "I saw this movie where a guy had a vein coming out of his face.  I never seen anything like that before."  Euphoric, disorganized, but a willing participant throughout that required redirection.   [name], [name] 06/30/2014 11:22 AM
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
## 2014-06-30 NOTE — Progress Notes (Signed)
Adult Psychoeducational Group Note  Date:  06/30/2014 Time:  12:07 AM  Group Topic/Focus:  Wrap-Up Group:   The focus of this group is to help patients review their daily goal of treatment and discuss progress on daily workbooks.  Participation Level:  None  Participation Quality:  none  Affect:  Flat  Cognitive:  Lacking  Insight: None  Engagement in Group:  None  Modes of Intervention:  Socialization and Support  Additional Comments:  Patient attended group, however, slept through the group session.  [name], James Hires [facility] 06/30/2014, 12:07 AM

## 2014-06-30 NOTE — Progress Notes (Signed)
Patient ID: [name], male   DOB: August 14, 1975, 39 y.o.   MRN: [id number] D: Patient pacing, dancing and singing in hallway. Pt repeately asking for ativan. Pt manic and responding to internal stimuli. Pt is hyperactive and intrusive at times. Pt denies SI/HI/AVH and pain. Pt attended evening karaoke but had to be redirected several times. No acute distressed noted at this time.   A: Met with pt 1:1. Medications administered as prescribed. Writer encouraged pt to discuss feelings. Pt encouraged to come to staff with any question or concerns.   R: Patient remains safe. He needs continues redirection. He is complaint with medications and denies any adverse reaction.

## 2014-06-30 NOTE — [facility] Group Notes (Signed)
[facility] Group Notes:  (Nursing/MHT/Case Management/Adjunct)  Date:  06/30/2014  Time:  0930am  Type of Therapy:  Nurse Education  Participation Level:  Active  Participation Quality:  Attentive and Intrusive  Affect:  Appropriate  Cognitive:  Alert  Insight:  Lacking  Engagement in Group:  Engaged  Modes of Intervention:  Discussion, Education and Support  Summary of Progress/Problems: Patient attended group and remained engaged. Pt responded inappropriately at times stating "I'm hungry" and wondering when he can have snack. Pt stated his goal for the day is "keep growing, I want to be 6'7"." When asked again pt stated his goal is to "keep calm."  Laurine BlazerGuthrie, GrenadaBrittany A 06/30/2014, 10:44 AM

## 2014-06-30 NOTE — Progress Notes (Addendum)
[facility] MD Progress Note  06/30/2014 1:56 PM James CLAUSING  MRN:  [id number] Subjective:Patient states "I am OK, I had paranoia two days ago, I saw these men in dreads coming my way.'  Objective: Patient seen and chart reviewed.Patient discussed with treatment team. Patient with hx of schizoaffective disorder ,presented very delusional. Patient continues to be hyperactive , loud , pacing on the unit , singing and dancing . Patient continues to be disorganized and delusional , talking about the "spirit in my car" as well as a 'girl who got murdered."Patient also with delusional thoughts like "I do not have a race  Since my license does not show it and I need to find out if I am adopted.  Patient's ACTT was contacted for his medication list and pt to be restarted on it. He is due for his Abilify maintena today. Patient will continued to be observed on the unit for medication readjustment. Patient is complaint on medications , denies side effects.   Principal Problem: Schizoaffective disorder, bipolar type Diagnosis:   Primary Psychiatric Diagnosis: Schizoaffective disorder,Bipolar type ,multiple episodes ,currently in acute episode   Secondary Psychiatric Diagnosis: Tobacco use disorder, severe   Non Psychiatric Diagnosis: See pmh Patient Active Problem List   Diagnosis Date Noted  . Schizoaffective disorder, bipolar type [F25.0] 06/29/2014   Total Time spent with patient: 30 minutes   Past Medical History:  Past Medical History  Diagnosis Date  . Paranoid schizophrenia   . Hyperlipidemia   . Schizophrenia   . Bipolar disorder     Past Surgical History  Procedure Laterality Date  . Testicle surgery     Family History:  Family History  Problem Relation Age of Onset  . Schizophrenia Neg Hx    Social History:  History  Alcohol Use No     History  Drug Use No    History   Social History  . Marital Status: Single    Spouse Name: N/A  . Number of Children: N/A  . Years  of Education: N/A   Social History Main Topics  . Smoking status: Current Every Day Smoker -- 1.00 packs/day    Types: Cigarettes  . Smokeless tobacco: Not on file  . Alcohol Use: No  . Drug Use: No  . Sexual Activity: Yes    Birth Control/ Protection: Condom   Other Topics Concern  . None   Social History Narrative   Additional History:    Sleep: Fair  Appetite:  Fair     Musculoskeletal: Strength & Muscle Tone: within normal limits Gait & Station: normal Patient leans: N/A   Psychiatric Specialty Exam: Physical Exam  Review of Systems  Psychiatric/Behavioral: Positive for hallucinations. The patient is nervous/anxious.     Blood pressure 116/73, pulse 72, temperature 98.2 F (36.8 C), temperature source Oral, resp. rate 20, height '5\' 10"'  (1.778 m), weight 113.399 kg (250 lb), SpO2 98 %.Body mass index is 35.87 kg/(m^2).  General Appearance: Disheveled  Eye Contact::  Good  Speech:  Pressured  Volume:  Increased  Mood:  Euphoric  Affect:  Labile  Thought Process:  Disorganized  Orientation:  Full (Time, Place, and Person)  Thought Content:  Delusions and Paranoid Ideation  Suicidal Thoughts:  No  Homicidal Thoughts:  No  Memory:  Immediate;   Fair Recent;   Poor Remote;   Poor  Judgement:  Impaired  Insight:  Lacking  Psychomotor Activity:  Restlessness  Concentration:  Poor  Recall:  Pleasure Point of Knowledge:Poor  Language: Fair  Akathisia:  No  Handed:  Right  AIMS (if indicated):     Assets:  Communication Skills Desire for Improvement  ADL's:  Intact  Cognition: WNL  Sleep:  Number of Hours: 3     Current Medications: Current Facility-Administered Medications  Medication Dose Route Frequency Provider Last Rate Last Dose  . acetaminophen (TYLENOL) tablet 650 mg  650 mg Oral Q6H PRN Encarnacion Slates, NP   650 mg at 06/30/14 0527  . alum & mag hydroxide-simeth (MAALOX/MYLANTA) 200-200-20 MG/5ML suspension 30 mL  30 mL Oral Q4H PRN Encarnacion Slates,  NP      . ARIPiprazole (ABILIFY) tablet 20 mg  20 mg Oral Once Ursula Alert, MD      . Derrill Memo ON 07/01/2014] ARIPiprazole (ABILIFY) tablet 25 mg  25 mg Oral QPM Ursula Alert, MD      . Derrill Memo ON 07/01/2014] benztropine (COGENTIN) tablet 0.5 mg  0.5 mg Oral q1800 Barbette Mcglaun, MD      . hydrOXYzine (ATARAX/VISTARIL) tablet 25 mg  25 mg Oral Q6H PRN Encarnacion Slates, NP   25 mg at 06/29/14 1910  . lamoTRIgine (LAMICTAL) tablet 25 mg  25 mg Oral BH-qamhs Mckynzie Liwanag, MD      . lithium carbonate capsule 600 mg  600 mg Oral Q supper Antionetta Ator, MD      . magnesium hydroxide (MILK OF MAGNESIA) suspension 30 mL  30 mL Oral Daily PRN Encarnacion Slates, NP      . multivitamin with minerals tablet 1 tablet  1 tablet Oral Daily Encarnacion Slates, NP   1 tablet at 06/30/14 [phone]  . nicotine (NICODERM CQ - dosed in mg/24 hours) patch 21 mg  21 mg Transdermal Q0600 Encarnacion Slates, NP   21 mg at 06/30/14 0528  . nicotine polacrilex (NICORETTE) gum 2 mg  2 mg Oral PRN Ursula Alert, MD   2 mg at 06/29/14 1910  . risperiDONE (RISPERDAL M-TABS) disintegrating tablet 2 mg  2 mg Oral Q8H PRN Laverle Hobby, PA-C   2 mg at 06/29/14 2683   And  . ziprasidone (GEODON) injection 20 mg  20 mg Intramuscular PRN Laverle Hobby, PA-C      . traZODone (DESYREL) tablet 150 mg  150 mg Oral QHS Encarnacion Slates, NP   150 mg at 06/29/14 2124    Lab Results:  Results for orders placed or performed during the hospital encounter of 06/28/14 (from the past 48 hour(s))  Comprehensive metabolic panel     Status: Abnormal   Collection Time: 06/28/14  4:20 PM  Result Value Ref Range   Sodium 140 135 - 145 mmol/L   Potassium 4.0 3.5 - 5.1 mmol/L   Chloride 107 96 - 112 mmol/L   CO2 24 19 - 32 mmol/L   Glucose, Bld 110 (H) 70 - 99 mg/dL   BUN 10 6 - 23 mg/dL   Creatinine, Ser 0.95 0.50 - 1.35 mg/dL   Calcium 9.0 8.4 - 10.5 mg/dL   Total Protein 7.5 6.0 - 8.3 g/dL   Albumin 4.3 3.5 - 5.2 g/dL   AST 38 (H) 0 - 37 U/L   ALT 37 0 -  53 U/L   Alkaline Phosphatase 72 39 - 117 U/L   Total Bilirubin 0.6 0.3 - 1.2 mg/dL   GFR calc non Af Amer >90 >90 mL/min   GFR calc Af Amer >90 >90 mL/min    Comment: (NOTE) The eGFR has  been calculated using the CKD EPI equation. This calculation has not been validated in all clinical situations. eGFR's persistently <90 mL/min signify possible Chronic Kidney Disease.    Anion gap 9 5 - 15  CBC with Differential     Status: Abnormal   Collection Time: 06/28/14  4:20 PM  Result Value Ref Range   WBC 11.7 (H) 4.0 - 10.5 K/uL   RBC 5.07 4.22 - 5.81 MIL/uL   Hemoglobin 15.4 13.0 - 17.0 g/dL   HCT 45.2 39.0 - 52.0 %   MCV 89.2 78.0 - 100.0 fL   MCH 30.4 26.0 - 34.0 pg   MCHC 34.1 30.0 - 36.0 g/dL   RDW 12.6 11.5 - 15.5 %   Platelets 325 150 - 400 K/uL   Neutrophils Relative % 69 43 - 77 %   Neutro Abs 8.2 (H) 1.7 - 7.7 K/uL   Lymphocytes Relative 23 12 - 46 %   Lymphs Abs 2.6 0.7 - 4.0 K/uL   Monocytes Relative 4 3 - 12 %   Monocytes Absolute 0.5 0.1 - 1.0 K/uL   Eosinophils Relative 3 0 - 5 %   Eosinophils Absolute 0.3 0.0 - 0.7 K/uL   Basophils Relative 1 0 - 1 %   Basophils Absolute 0.1 0.0 - 0.1 K/uL  Ethanol     Status: None   Collection Time: 06/28/14  4:20 PM  Result Value Ref Range   Alcohol, Ethyl (B) <5 0 - 9 mg/dL    Comment:        LOWEST DETECTABLE LIMIT FOR SERUM ALCOHOL IS 11 mg/dL FOR MEDICAL PURPOSES ONLY   Urine rapid drug screen (hosp performed)     Status: None   Collection Time: 06/28/14  4:59 PM  Result Value Ref Range   Opiates NONE DETECTED NONE DETECTED   Cocaine NONE DETECTED NONE DETECTED   Benzodiazepines NONE DETECTED NONE DETECTED   Amphetamines NONE DETECTED NONE DETECTED   Tetrahydrocannabinol NONE DETECTED NONE DETECTED   Barbiturates NONE DETECTED NONE DETECTED    Comment:        DRUG SCREEN FOR MEDICAL PURPOSES ONLY.  IF CONFIRMATION IS NEEDED FOR ANY PURPOSE, NOTIFY LAB WITHIN 5 DAYS.        LOWEST DETECTABLE LIMITS FOR URINE  DRUG SCREEN Drug Class       Cutoff (ng/mL) Amphetamine      1000 Barbiturate      200 Benzodiazepine   671 Tricyclics       245 Opiates          300 Cocaine          300 THC              50     Physical Findings: AIMS: Facial and Oral Movements Muscles of Facial Expression: None, normal Lips and Perioral Area: None, normal Jaw: None, normal Tongue: None, normal,Extremity Movements Upper (arms, wrists, hands, fingers): None, normal Lower (legs, knees, ankles, toes): None, normal, Trunk Movements Neck, shoulders, hips: None, normal, Overall Severity Severity of abnormal movements (highest score from questions above): None, normal Incapacitation due to abnormal movements: None, normal Patient's awareness of abnormal movements (rate only patient's report): No Awareness,    CIWA:    COWS:     Assessment: Patient presented delusional as well as disorganized. Patient is loud, euphoric, hyperactive on the unit. Will continue to need medication readjustment.  Treatment Plan Summary: Daily contact with patient to assess and evaluate symptoms and progress in treatment and Medication  management Will reastart Abilify 25 mg po daily. Medication list obtained from ACTT. Patient to be given Abilify Maintena 400 mg IM today. Will restart Lamictal 25 mg po bid. Will restart Lithium 600 mg po qpm. Li level on admission was subtherapeutic. Continue Trazodone 150 mg po qhs for sleep. Discussed riks of smoking , provided counseling as well as nicotine gums. Will make available prn medications for agitation/anxiety. Will continue to monitor vitals ,medication compliance and treatment side effects while patient is here.  Will monitor for medical issues as well as call consult as needed.  Reviewed labs. CSW will start working on disposition.  Patient to participate in therapeutic milieu .     Medical Decision Making:  Review of Psycho-Social Stressors (1), Review or order clinical lab tests  (1), Decision to obtain old records (1), Review and summation of old records (2), Review of Last Therapy Session (1), Review or order medicine tests (1), Review of Medication Regimen & Side Effects (2) and Review of New Medication or Change in Dosage (2)     Yohan Samons MD 06/30/2014, 1:56 PM

## 2014-07-01 LAB — TSH: TSH: 1.572 u[IU]/mL (ref 0.350–4.500)

## 2014-07-01 MED ORDER — GABAPENTIN 100 MG PO CAPS
100.0000 mg | ORAL_CAPSULE | Freq: Three times a day (TID) | ORAL | Status: DC
  Administered 2014-07-01 – 2014-07-04 (×10): 100 mg via ORAL
  Filled 2014-07-01 (×16): qty 1

## 2014-07-01 MED ORDER — BENZTROPINE MESYLATE 1 MG PO TABS
1.0000 mg | ORAL_TABLET | Freq: Two times a day (BID) | ORAL | Status: DC
  Administered 2014-07-01 – 2014-07-07 (×12): 1 mg via ORAL
  Filled 2014-07-01 (×14): qty 1

## 2014-07-01 MED ORDER — ARIPIPRAZOLE 15 MG PO TABS
15.0000 mg | ORAL_TABLET | Freq: Every evening | ORAL | Status: DC
  Administered 2014-07-01 – 2014-07-04 (×4): 15 mg via ORAL
  Filled 2014-07-01 (×5): qty 1

## 2014-07-01 MED ORDER — ARIPIPRAZOLE 10 MG PO TABS
10.0000 mg | ORAL_TABLET | Freq: Every day | ORAL | Status: DC
  Administered 2014-07-01 – 2014-07-07 (×7): 10 mg via ORAL
  Filled 2014-07-01 (×9): qty 1

## 2014-07-01 NOTE — Progress Notes (Signed)
Patient ID: Nichola SizerBrian T Hengst, male   DOB: May 13, 1975, 39 y.o.   MRN: [id number] D: Client is visible on the unit, walking up and down the hall rapping, loud and intrusive at times, but can be redirected. Client still paranoid, laughs at times inappropriately. A: Writer introduced self to client, reviews medications, administered with no difficulty. Staff will monitor q2715min for safety. R: client is safe on the unit, attends group.

## 2014-07-01 NOTE — Progress Notes (Signed)
Pt brought back from cafeteria for jumping on trash can and stating he smell semen. Pt is redirectable and pacing and talking to himself. Medication administered as prescribed.

## 2014-07-01 NOTE — Progress Notes (Addendum)
[facility]  07/01/2014 11:56 AM [name]  MRN:  [id number] Subjective:Patient states "I am OK. I once got a video of an UFO , its in my phone, I could show it to you."  Objective: Patient seen and chart reviewed.Patient discussed with treatment team. Patient with hx of schizoaffective disorder ,presented very delusional. Patient continues to be hyperactive , restless, seen pacing on the unit , singing and dancing ,requiring redirection. Patient was restarted on his medications as per ACTT yesterday. However he has been refusing to take Lithium reporting SE and that his outpatient had asked him to stop it. Patient give IM abilify maintena yesterday. Patient per staff has been requiring prn Risperdal . Will divide the dose of Abilify to two divided doses to help with his sx. Patient will continued to be observed on the unit for medication readjustment. Patient denies side effects.   Principal Problem: Schizoaffective disorder, bipolar type Diagnosis:   Primary Psychiatric Diagnosis: Schizoaffective disorder,Bipolar type ,multiple episodes ,currently in acute episode   Secondary Psychiatric Diagnosis: Tobacco use disorder, severe   Non Psychiatric Diagnosis: See pmh Patient Active Problem List   Diagnosis Date Noted  . Schizoaffective disorder, bipolar type [F25.0] 06/29/2014   Total Time spent with patient: 30 minutes   Past Medical History:  Past Medical History  Diagnosis Date  . Paranoid schizophrenia   . Hyperlipidemia   . Schizophrenia   . Bipolar disorder     Past Surgical History  Procedure Laterality Date  . Testicle surgery     Family History:  Family History  Problem Relation Age of Onset  . Schizophrenia Neg Hx    Social History:  History  Alcohol Use No     History  Drug Use No    History   Social History  . Marital Status: Single    Spouse Name: N/A  . Number of Children: N/A  . Years of Education: N/A   Social History Main Topics   . Smoking status: Current Every Day Smoker -- 1.00 packs/day    Types: Cigarettes  . Smokeless tobacco: Not on file  . Alcohol Use: No  . Drug Use: No  . Sexual Activity: Yes    Birth Control/ Protection: Condom   Other Topics Concern  . None   Social History Narrative   Additional History:    Sleep: Fair  Appetite:  Fair     Musculoskeletal: Strength & Muscle Tone: within normal limits Gait & Station: normal Patient leans: N/A   Psychiatric Specialty Exam: Physical Exam  Review of Systems  Psychiatric/Behavioral: The patient is nervous/anxious.     Blood pressure 116/73, pulse 72, temperature 98.2 F (36.8 C), temperature source Oral, resp. rate 20, height 5\' 10"  (1.778 m), weight 113.399 kg (250 lb), SpO2 98 %.Body mass index is 35.87 kg/(m^2).  General Appearance: Disheveled  Eye Contact::  Good  Speech:  Pressured  Volume:  Increased  Mood:  Euphoric  Affect:  Labile  Thought Process:  Disorganized  Orientation:  Full (Time, Place, and Person)  Thought Content:  Delusions and Paranoid Ideation  Suicidal Thoughts:  No  Homicidal Thoughts:  No  Memory:  Immediate;   Fair Recent;   Fair Remote;   Fair  Judgement:  Impaired  Insight:  Lacking  Psychomotor Activity:  Restlessness  Concentration:  Poor  Recall:  Fair  Fund of Knowledge:Poor  Language: Fair  Akathisia:  No  Handed:  Right  AIMS (if indicated):   0  Assets:  Communication Skills Desire for Improvement  ADL's:  Intact  Cognition: WNL  Sleep:  Number of Hours: 3.75     Current Medications: Current Facility-Administered Medications  Medication Dose Route Frequency Provider Last Rate Last Dose  . acetaminophen (TYLENOL) tablet 650 mg  650 mg Oral Q6H PRN Sanjuana Kava, NP   650 mg at 06/30/14 1556  . alum & mag hydroxide-simeth (MAALOX/MYLANTA) 200-200-20 MG/5ML suspension 30 mL  30 mL Oral Q4H PRN Sanjuana Kava, NP   30 mL at 06/30/14 2013  . ARIPiprazole (ABILIFY) tablet 10 mg  10  mg Oral Daily Marelly Wehrman, MD      . ARIPiprazole (ABILIFY) tablet 15 mg  15 mg Oral QPM Urie Loughner, MD      . benztropine (COGENTIN) tablet 1 mg  1 mg Oral BID Alyssia Heese, MD      . hydrOXYzine (ATARAX/VISTARIL) tablet 25 mg  25 mg Oral Q6H PRN Sanjuana Kava, NP   25 mg at 07/01/14 0331  . lamoTRIgine (LAMICTAL) tablet 25 mg  25 mg Oral BH-qamhs Larwence Tu, MD   25 mg at 07/01/14 0848  . magnesium hydroxide (MILK OF MAGNESIA) suspension 30 mL  30 mL Oral Daily PRN Sanjuana Kava, NP      . multivitamin with minerals tablet 1 tablet  1 tablet Oral Daily Sanjuana Kava, NP   1 tablet at 07/01/14 0848  . nicotine (NICODERM CQ - dosed in mg/24 hours) patch 21 mg  21 mg Transdermal Q0600 Sanjuana Kava, NP   21 mg at 07/01/14 0848  . nicotine polacrilex (NICORETTE) gum 2 mg  2 mg Oral PRN Jomarie Longs, MD   2 mg at 06/29/14 1910  . risperiDONE (RISPERDAL M-TABS) disintegrating tablet 2 mg  2 mg Oral Q8H PRN Kerry Hough, PA-C   2 mg at 07/01/14 1610   And  . ziprasidone (GEODON) injection 20 mg  20 mg Intramuscular PRN Kerry Hough, PA-C      . traZODone (DESYREL) tablet 150 mg  150 mg Oral QHS Sanjuana Kava, NP   150 mg at 06/30/14 2136    Lab Results:  Results for orders placed or performed during the hospital encounter of 06/28/14 (from the past 48 hour(s))  TSH     Status: None   Collection Time: 07/01/14  6:18 AM  Result Value Ref Range   TSH 1.572 0.350 - 4.500 uIU/mL    Comment: Performed at [facility]    Physical Findings: AIMS: Facial and Oral Movements Muscles of Facial Expression: None, normal Lips and Perioral Area: None, normal Jaw: None, normal Tongue: None, normal,Extremity Movements Upper (arms, wrists, hands, fingers): None, normal Lower (legs, knees, ankles, toes): None, normal, Trunk Movements Neck, shoulders, hips: None, normal, Overall Severity Severity of abnormal movements (highest score from questions above): None,  normal Incapacitation due to abnormal movements: None, normal Patient's awareness of abnormal movements (rate only patient's report): No Awareness,    CIWA:    COWS:     Assessment: Patient presented delusional as well as disorganized. Patient continues to be loud on the unit ,requiring prn medications as well as redirection.  Treatment Plan Summary: Daily contact with patient to assess and evaluate symptoms and progress in treatment and Medication management Will continue Abilify 25 mg po daily, but will change to 10 mg po daily and 15 mg po qpm. Medication list obtained from ACTT. Patient  given Abilify Maintena 400 mg  IM on 06/30/14 - per ACTT patient was due on the same day. Will continue Lamictal 25 mg po bid. Will add Gabapentin 100 mg po tid to augment the effect of Lamictal. Will DC Lithium due to SE concerns. Continue Trazodone 150 mg po qhs for sleep. Discussed riks of smoking , provided counseling as well as nicotine gums. Will make available prn medications for agitation/anxiety. Will continue to monitor vitals ,medication compliance and treatment side effects while patient is here.  Will monitor for medical issues as well as call consult as needed.  Reviewed labs.TSH - WNL. CSW will start working on disposition.  Patient to participate in therapeutic milieu .     Medical Decision Making:  Review of Psycho-Social Stressors (1), Review and summation of old records (2), Order AIMS Test (2), Review of Last Therapy Session (1), Review of Medication Regimen & Side Effects (2) and Review of New Medication or Change in Dosage (2)     Anisa Leanos MD 07/01/2014, 11:56 AM

## 2014-07-01 NOTE — [facility] Group Notes (Signed)
[facility]   07/01/2014 12:06 PM  Participation Quality:  Minimal  Mood/Affect:  Eupohoric  Depression Rating:  10  Anxiety Rating:  0  Thoughts of Suicide:  No Will you contract for safety?   NA  Current AVH:  Denies  Plan for Discharge/Comments:  "I'm still growing.  They said I grew an inch last year.  I hope I get to be 6\' 7" ."  Still pressured, euphoric, breaking into rap.    Transportation Means: mother  Supports: mother  [name], [name] B

## 2014-07-01 NOTE — Progress Notes (Signed)
D: Pt presents anxious this morning, animated affect. Pt fidgety and continues to pace hallway. Pt loud, intrusive, inappropriate laughing and is impulsive. Pt requires redirecting from staff for inappropriate behaviors. Pt thoughts are disorganized and speech is rapid and pressured. Pt denies suicidal thoughts. Pt denies AVH. No complaints verbalized by pt. Pt compliant with taking meds and attending groups.  A: Medications administered as ordered per MD. Verbal support given. Pt encouraged to attend groups. Pt redirected by staff as needed. 15 minute checks performed for safety.  R: Pt receptive to treatment.

## 2014-07-01 NOTE — [facility] Group Notes (Signed)
[facility] LCSW Group Therapy  07/01/2014  1:05 PM  Type of Therapy:  Group therapy  Participation Level:  Active  Participation Quality:  Attentive  Affect:  Flat  Cognitive:  Oriented  Insight:  Limited  Engagement in Therapy:  Limited  Modes of Intervention:  Discussion, Socialization  Summary of Progress/Problems:  Chaplain was here to lead a group on themes of hope and courage. Sat quietly for the most part.  "I'll get back to you on that."  Drowsey.  "I gotta try to keep it clean."  Daryel Geraldorth, Kaliopi Blyden B 07/01/2014 1:25 PM

## 2014-07-01 NOTE — Plan of Care (Signed)
Problem: Aggression Towards others,Towards Self, and or Destruction Goal: STG-Patient will comply with prescribed medication regimen (Patient will comply with prescribed medication regimen)  Outcome: Progressing Pt is compliant with scheduled medication regimen

## 2014-07-02 NOTE — Progress Notes (Signed)
[facility] MD Progress Note  07/02/2014 11:49 AM [name]  MRN:  [id number] Subjective:Patient states feeling better, "I can't go down any further, I can only go up". Pt has been talking to peers, and attending group this AM. Pt focused on growing his hair back (wants to order something from TV), since he is balding. Pt talks about rap music.  Objective: Patient seen and chart reviewed.Patient discussed with treatment team. Patient with hx of schizoaffective disorder ,presented very delusional. Patient continues to be hyperactive , restless, seen pacing on the unit , singing and dancing ,requiring redirection. Patient was restarted on his medications as per ACTT. However he has been refusing to take Lithium reporting SE and that his outpatient had asked him to stop it. Patient given IM abilify maintena. Patient per staff has been requiring prn Risperdal . Will divide the dose of Abilify to two divided doses to help with his sx. Patient will continued to be observed on the unit for medication readjustment. Patient denies side effects.   Principal Problem: Schizoaffective disorder, bipolar type Diagnosis:   Primary Psychiatric Diagnosis: Schizoaffective disorder,Bipolar type ,multiple episodes ,currently in acute episode   Secondary Psychiatric Diagnosis: Tobacco use disorder, severe   Non Psychiatric Diagnosis: See pmh Patient Active Problem List   Diagnosis Date Noted  . Schizoaffective disorder, bipolar type [F25.0] 06/29/2014   Total Time spent with patient: 30 minutes   Past Medical History:  Past Medical History  Diagnosis Date  . Paranoid schizophrenia   . Hyperlipidemia   . Schizophrenia   . Bipolar disorder     Past Surgical History  Procedure Laterality Date  . Testicle surgery     Family History:  Family History  Problem Relation Age of Onset  . Schizophrenia Neg Hx    Social History:  History  Alcohol Use No     History  Drug Use No    History   Social  History  . Marital Status: Single    Spouse Name: N/A  . Number of Children: N/A  . Years of Education: N/A   Social History Main Topics  . Smoking status: Current Every Day Smoker -- 1.00 packs/day    Types: Cigarettes  . Smokeless tobacco: Not on file  . Alcohol Use: No  . Drug Use: No  . Sexual Activity: Yes    Birth Control/ Protection: Condom   Other Topics Concern  . None   Social History Narrative   Additional History:    Sleep: Fair  Appetite:  Fair     Musculoskeletal: Strength & Muscle Tone: within normal limits Gait & Station: normal Patient leans: N/A   Psychiatric Specialty Exam: Physical Exam  ROS  Blood pressure 110/68, pulse 97, temperature 98.5 F (36.9 C), temperature source Oral, resp. rate 20, height  (1.778 m), weight 113.399 kg (250 lb), SpO2 98 %.Body mass index is 35.87 kg/(m^2).  General Appearance: Disheveled  Eye Contact::  Good  Speech:  Pressured  Volume:  Increased  Mood:  Euphoric  Affect:  Labile  Thought Process:  Disorganized  Orientation:  Full (Time, Place, and Person)  Thought Content:  Delusions and Paranoid Ideation  Suicidal Thoughts:  No  Homicidal Thoughts:  No  Memory:  Immediate;   Fair Recent;   Fair Remote;   Fair  Judgement:  Impaired  Insight:  Lacking  Psychomotor Activity:  Restlessness  Concentration:  Poor  Recall:  Fair  Fund of Knowledge:Poor  Language: Fair  Akathisia:  No  Handed:  Right  AIMS (if indicated):   0  Assets:  Communication Skills Desire for Improvement  ADL's:  Intact  Cognition: WNL  Sleep:  Number of Hours: 6     Current Medications: Current Facility-Administered Medications  Medication Dose Route Frequency Provider Last Rate Last Dose  . acetaminophen (TYLENOL) tablet 650 mg  650 mg Oral Q6H PRN Sanjuana Kava, NP   650 mg at 06/30/14 1556  . alum & mag hydroxide-simeth (MAALOX/MYLANTA) 200-200-20 MG/5ML suspension 30 mL  30 mL Oral Q4H PRN Sanjuana Kava, NP   30  mL at 07/01/14 1918  . ARIPiprazole (ABILIFY) tablet 10 mg  10 mg Oral Daily Jomarie Longs, MD   10 mg at 07/02/14 1308  . ARIPiprazole (ABILIFY) tablet 15 mg  15 mg Oral QPM Saramma Eappen, MD   15 mg at 07/01/14 1705  . benztropine (COGENTIN) tablet 1 mg  1 mg Oral BID Jomarie Longs, MD   1 mg at 07/02/14 6578  . gabapentin (NEURONTIN) capsule 100 mg  100 mg Oral TID Jomarie Longs, MD   100 mg at 07/02/14 0812  . hydrOXYzine (ATARAX/VISTARIL) tablet 25 mg  25 mg Oral Q6H PRN Sanjuana Kava, NP   25 mg at 07/01/14 0331  . lamoTRIgine (LAMICTAL) tablet 25 mg  25 mg Oral BH-qamhs Jomarie Longs, MD   25 mg at 07/02/14 4696  . magnesium hydroxide (MILK OF MAGNESIA) suspension 30 mL  30 mL Oral Daily PRN Sanjuana Kava, NP      . multivitamin with minerals tablet 1 tablet  1 tablet Oral Daily Sanjuana Kava, NP   1 tablet at 07/02/14 [id number]  . nicotine (NICODERM CQ - dosed in mg/24 hours) patch 21 mg  21 mg Transdermal Q0600 Sanjuana Kava, NP   21 mg at 07/02/14 8413  . nicotine polacrilex (NICORETTE) gum 2 mg  2 mg Oral PRN Jomarie Longs, MD   2 mg at 07/02/14 0812  . risperiDONE (RISPERDAL M-TABS) disintegrating tablet 2 mg  2 mg Oral Q8H PRN Kerry Hough, PA-C   2 mg at 07/01/14 2440   And  . ziprasidone (GEODON) injection 20 mg  20 mg Intramuscular PRN Kerry Hough, PA-C      . traZODone (DESYREL) tablet 150 mg  150 mg Oral QHS Sanjuana Kava, NP   150 mg at 07/01/14 2114    Lab Results:  Results for orders placed or performed during the hospital encounter of 06/28/14 (from the past 48 hour(s))  TSH     Status: None   Collection Time: 07/01/14  6:18 AM  Result Value Ref Range   TSH 1.572 0.350 - 4.500 uIU/mL    Comment: Performed at [facility]    Physical Findings: AIMS: Facial and Oral Movements Muscles of Facial Expression: None, normal Lips and Perioral Area: None, normal Jaw: None, normal Tongue: None, normal,Extremity Movements Upper (arms, wrists, hands,  fingers): None, normal Lower (legs, knees, ankles, toes): None, normal, Trunk Movements Neck, shoulders, hips: None, normal, Overall Severity Severity of abnormal movements (highest score from questions above): None, normal Incapacitation due to abnormal movements: None, normal Patient's awareness of abnormal movements (rate only patient's report): No Awareness,    CIWA:    COWS:     Assessment: Patient presented delusional as well as disorganized. Patient continues to be loud on the unit ,requiring prn medications as well as redirection.  Treatment Plan Summary: Daily contact with patient to assess  and evaluate symptoms and progress in treatment and Medication management Will continue Abilify 10 mg po daily and 15 mg po qpm. Medication list obtained from ACTT. Patient  given Abilify Maintena 400 mg IM on 06/30/14 - per ACTT patient was due on the same day. Will continue Lamictal 25 mg po bid. Will continue Gabapentin 100 mg po tid to augment the effect of Lamictal. Continue Trazodone 150 mg po qhs for sleep. Discussed riks of smoking , provided counseling as well as nicotine gums. Will make available prn medications for agitation/anxiety. Will continue to monitor vitals ,medication compliance and treatment side effects while patient is here.  Will monitor for medical issues as well as call consult as needed.  Reviewed labs.TSH - WNL. CSW will start working on disposition.  Patient to participate in therapeutic milieu .     Medical Decision Making:  Review of Psycho-Social Stressors (1), Review and summation of old records (2), Order AIMS Test (2), Review of Last Therapy Session (1), Review of Medication Regimen & Side Effects (2) and Review of New Medication or Change in Dosage (2)     Ancil LinseySARANGA, Arta Stump MD 07/02/2014, 11:49 AM

## 2014-07-02 NOTE — Progress Notes (Signed)
Adult Psychoeducational Group Note  Date:  07/02/2014 Time:  8:57 PM  Group Topic/Focus:  Wrap-Up Group:   The focus of this group is to help patients review their daily goal of treatment and discuss progress on daily workbooks.  Participation Level:  Active  Participation Quality:  Appropriate  Affect:  Appropriate  Cognitive:  Appropriate  Insight: Appropriate  Engagement in Group:  Engaged  Modes of Intervention:  Discussion  Additional Comments: The patient expressed that group was about favorite weather.The patient also said that he had a good day.  Octavio Mannshigpen, Zamir Staples Lee 07/02/2014, 8:57 PM

## 2014-07-02 NOTE — Progress Notes (Signed)
D Arlys JohnBrian has spent most of his day walking up and down the  500 hall. As he walks, he talks to himself...and anybody that is in is earshot and he talks about arbitrary things, telling jokes that frequently do not make sense  and then singing parts of songs that are not familiar.   His voice is loud, his laugh can be very loud and he frequently makes jokes that neither patients or staff  Are able to discern what, exactly , the punch line is.\   A He takes his medications as  Planned and will make eye contact with this nurse every few minutes, looking for assurance that he is " okay".   R He does completes his morning assessment and on it he writes he denies SI within the past 24 hrs and he rates his depression, hopelessness and anxiety " 0/0/0. Repsectively. Safety in place.

## 2014-07-02 NOTE — Plan of Care (Signed)
Problem: Diagnosis: Increased Risk For Suicide Attempt Goal: STG-Patient Will Attend All Groups On The Unit Outcome: Progressing Patient attended group this evening and participated.     

## 2014-07-02 NOTE — Plan of Care (Signed)
Problem: Aggression Towards others,Towards Self, and or Destruction Goal: STG-Patient will comply with prescribed medication regimen (Patient will comply with prescribed medication regimen)  Outcome: Progressing Patient taking medications as scheduled for hs.

## 2014-07-02 NOTE — [facility] Group Notes (Signed)
[facility] Group Notes:  (Clinical Social Work)  07/02/2014  11:15-12:00PM  Summary of Progress/Problems:   The main focus of today's process group was to discuss patients' feelings about hospitalization, the stigma attached to mental health, and sources of motivation to stay well.  We then worked to identify a specific plan to avoid future hospitalizations when discharged from the hospital for this admission.  The patient expressed that he has stopped taking his Risperdal in the past, and that led to hospitalization.  He was tangential and distractible during group, but pleasant.  Was eventually called out to see doctor.  Type of Therapy:  Group Therapy - Process  Participation Level:  Active  Participation Quality:  Attentive, Monopolizing and Redirectable  Affect:  Excited  Cognitive:  Disorganized  Insight:  Improving  Engagement in Therapy:  Improving  Modes of Intervention:  Exploration, Discussion  James MantleMareida Grossman-Orr, LCSW 07/02/2014, 12:44 PM

## 2014-07-02 NOTE — Progress Notes (Signed)
Writer has observed the patient pacing up and down the hallway talking about issues with his car and his girlfriend possibly cheating on him among other things. He was visited by his mother and step-father earlier this evening. He at first did not want his step-father to visit but later changed his mind and allowed him to come on the hall. Writer informed him of his scheduled medications and is agreeable to taking them at scheduled time. Patient can be easily redirected and denies si/hi/a/v hallucinations. Support and encouragement given, safety maintained on unit with 15 min checks.

## 2014-07-03 NOTE — Progress Notes (Signed)
[facility] MD Progress Note  07/03/2014 5:36 PM [name]  MRN:  [id number] Subjective:Patient states feeling good today. Pt continues to pace the hall throughout the day. Pt has been talking to peers, and attending group this AM. Pt focused on growing his hair back (wants to order something from TV), since he is balding. Pt talks about rap music. Pt reports that if any comments were made on his facebook page, then someone hacked his facebook account.  Objective: Patient seen and chart reviewed.Patient discussed with treatment team. Patient with hx of schizoaffective disorder ,presented very delusional. Patient continues to be hyperactive , restless, seen pacing on the unit , singing and dancing ,requiring redirection. Patient was restarted on his medications as per ACTT. However he has been refusing to take Lithium reporting SE and that his outpatient had asked him to stop it. Patient given IM abilify maintena. Patient per staff has been requiring prn Risperdal . Will divide the dose of Abilify to two divided doses to help with his sx. Patient will continued to be observed on the unit for medication readjustment. Patient denies side effects.   Principal Problem: Schizoaffective disorder, bipolar type Diagnosis:   Primary Psychiatric Diagnosis: Schizoaffective disorder,Bipolar type ,multiple episodes ,currently in acute episode   Secondary Psychiatric Diagnosis: Tobacco use disorder, severe   Non Psychiatric Diagnosis: See pmh Patient Active Problem List   Diagnosis Date Noted  . Schizoaffective disorder, bipolar type [F25.0] 06/29/2014   Total Time spent with patient: 30 minutes   Past Medical History:  Past Medical History  Diagnosis Date  . Paranoid schizophrenia   . Hyperlipidemia   . Schizophrenia   . Bipolar disorder     Past Surgical History  Procedure Laterality Date  . Testicle surgery     Family History:  Family History  Problem Relation Age of Onset  .  Schizophrenia Neg Hx    Social History:  History  Alcohol Use No     History  Drug Use No    History   Social History  . Marital Status: Single    Spouse Name: N/A  . Number of Children: N/A  . Years of Education: N/A   Social History Main Topics  . Smoking status: Current Every Day Smoker -- 1.00 packs/day    Types: Cigarettes  . Smokeless tobacco: Not on file  . Alcohol Use: No  . Drug Use: No  . Sexual Activity: Yes    Birth Control/ Protection: Condom   Other Topics Concern  . None   Social History Narrative   Additional History:    Sleep: Fair  Appetite:  Fair     Musculoskeletal: Strength & Muscle Tone: within normal limits Gait & Station: normal Patient leans: N/A   Psychiatric Specialty Exam: Physical Exam  ROS  Blood pressure 116/67, pulse 96, temperature 97.9 F (36.6 C), temperature source Oral, resp. rate 20, height  (1.778 m), weight 113.399 kg (250 lb), SpO2 98 %.Body mass index is 35.87 kg/(m^2).  General Appearance: Disheveled  Eye Contact::  Good  Speech:  Pressured  Volume:  Increased  Mood:  Euphoric  Affect:  Labile  Thought Process:  Disorganized  Orientation:  Full (Time, Place, and Person)  Thought Content:  Delusions and Paranoid Ideation  Suicidal Thoughts:  No  Homicidal Thoughts:  No  Memory:  Immediate;   Fair Recent;   Fair Remote;   Fair  Judgement:  Impaired  Insight:  Lacking  Psychomotor Activity:  Restlessness  Concentration:  Poor  Recall:  [employer] of Knowledge:Poor  Language: Fair  Akathisia:  No  Handed:  Right  AIMS (if indicated):   0  Assets:  Communication Skills Desire for Improvement  ADL's:  Intact  Cognition: WNL  Sleep:  Number of Hours: 0     Current Medications: Current Facility-Administered Medications  Medication Dose Route Frequency Provider Last Rate Last Dose  . acetaminophen (TYLENOL) tablet 650 mg  650 mg Oral Q6H PRN Sanjuana Kava, NP   650 mg at 06/30/14 1556  .  alum & mag hydroxide-simeth (MAALOX/MYLANTA) 200-200-20 MG/5ML suspension 30 mL  30 mL Oral Q4H PRN Sanjuana Kava, NP   30 mL at 07/02/14 1335  . ARIPiprazole (ABILIFY) tablet 10 mg  10 mg Oral Daily Jomarie Longs, MD   10 mg at 07/03/14 0733  . ARIPiprazole (ABILIFY) tablet 15 mg  15 mg Oral QPM Saramma Eappen, MD   15 mg at 07/03/14 1722  . benztropine (COGENTIN) tablet 1 mg  1 mg Oral BID Jomarie Longs, MD   1 mg at 07/03/14 1722  . gabapentin (NEURONTIN) capsule 100 mg  100 mg Oral TID Jomarie Longs, MD   100 mg at 07/03/14 1722  . hydrOXYzine (ATARAX/VISTARIL) tablet 25 mg  25 mg Oral Q6H PRN Sanjuana Kava, NP   25 mg at 07/01/14 0331  . lamoTRIgine (LAMICTAL) tablet 25 mg  25 mg Oral BH-qamhs Saramma Eappen, MD   25 mg at 07/03/14 0732  . magnesium hydroxide (MILK OF MAGNESIA) suspension 30 mL  30 mL Oral Daily PRN Sanjuana Kava, NP      . multivitamin with minerals tablet 1 tablet  1 tablet Oral Daily Sanjuana Kava, NP   1 tablet at 07/03/14 0732  . nicotine (NICODERM CQ - dosed in mg/24 hours) patch 21 mg  21 mg Transdermal Q0600 Sanjuana Kava, NP   21 mg at 07/03/14 0733  . nicotine polacrilex (NICORETTE) gum 2 mg  2 mg Oral PRN Jomarie Longs, MD   2 mg at 07/02/14 0812  . risperiDONE (RISPERDAL M-TABS) disintegrating tablet 2 mg  2 mg Oral Q8H PRN Kerry Hough, PA-C   2 mg at 07/03/14 9604   And  . ziprasidone (GEODON) injection 20 mg  20 mg Intramuscular PRN Kerry Hough, PA-C      . traZODone (DESYREL) tablet 150 mg  150 mg Oral QHS Sanjuana Kava, NP   150 mg at 07/02/14 2121    Lab Results:  No results found for this or any previous visit (from the past 48 hour(s)).  Physical Findings: AIMS: Facial and Oral Movements Muscles of Facial Expression: None, normal Lips and Perioral Area: None, normal Jaw: None, normal Tongue: None, normal,Extremity Movements Upper (arms, wrists, hands, fingers): None, normal Lower (legs, knees, ankles, toes): None, normal, Trunk  Movements Neck, shoulders, hips: None, normal, Overall Severity Severity of abnormal movements (highest score from questions above): None, normal Incapacitation due to abnormal movements: None, normal Patient's awareness of abnormal movements (rate only patient's report): No Awareness,    CIWA:    COWS:     Assessment: Patient presented delusional as well as disorganized. Patient continues to be loud on the unit ,requiring prn medications as well as redirection.  Treatment Plan Summary: Daily contact with patient to assess and evaluate symptoms and progress in treatment and Medication management Will continue Abilify 10 mg po daily and 15 mg po qpm. Medication list obtained from ACTT.  Patient  given Abilify Maintena 400 mg IM on 06/30/14 - per ACTT patient was due on the same day. Will continue Lamictal 25 mg po bid. Will continue Gabapentin 100 mg po tid to augment the effect of Lamictal. Continue Trazodone 150 mg po qhs for sleep. Discussed riks of smoking , provided counseling as well as nicotine gums. Will make available prn medications for agitation/anxiety. Will continue to monitor vitals ,medication compliance and treatment side effects while patient is here.  Will monitor for medical issues as well as call consult as needed.  Reviewed labs.TSH - WNL. CSW will start working on disposition.  Patient to participate in therapeutic milieu .     Medical Decision Making:  Review of Psycho-Social Stressors (1), Review and summation of old records (2), Order AIMS Test (2), Review of Last Therapy Session (1), Review of Medication Regimen & Side Effects (2) and Review of New Medication or Change in Dosage (2)     Ancil LinseySARANGA, Josaiah Muhammed MD 07/03/2014, 5:36 PM

## 2014-07-03 NOTE — [facility] Group Notes (Signed)
[facility] Group Notes:  (Clinical Social Work)  07/03/2014   11:15am-12:00pm  Summary of Progress/Problems:  The main focus of today's process group was to listen to a variety of genres of music and to identify that different types of music provoke different responses.  The patient then was able to identify personally what was soothing for them, as well as energizing.  Handouts were used to record feelings evoked, as well as how patient can personally use this knowledge in sleep habits, with depression, and with other symptoms.  The patient expressed understanding of concepts, as well as knowledge of how each type of music affected him/her and how this can be used at home as a wellness/recovery tool.  He was tangential and intrusive at times, but was redirectable until he finally decided to leave group.  Type of Therapy:  Music Therapy   Participation Level:  Active  Participation Quality:  Attentive and Sharing  Affect:  Blunted  Cognitive:  Oriented  Insight:  Developing  Engagement in Therapy:  Developing  Modes of Intervention:   Activity, Exploration  Ambrose MantleMareida Grossman-Orr, LCSW 07/03/2014, 12:30pm

## 2014-07-03 NOTE — Progress Notes (Signed)
Adult Psychoeducational Group Note  Date:  07/03/2014 Time:  8:49 PM  Group Topic/Focus:  Wrap-Up Group:   The focus of this group is to help patients review their daily goal of treatment and discuss progress on daily workbooks.  Participation Level:  Active  Participation Quality:  Appropriate  Affect:  Appropriate  Cognitive:  Appropriate  Insight: Appropriate  Engagement in Group:  Engaged  Modes of Intervention:  Discussion  Additional Comments:  The patient expressed that his healthy support system is his family.The patient also said that he had a good day.  Octavio Mannshigpen, Quintarius Ferns Lee 07/03/2014, 8:49 PM

## 2014-07-03 NOTE — Progress Notes (Signed)
[name] is loud, intrusive, labile and restless. HE spends most of his " free" time out in the hallway...aortic stenosis he walks up and down the halls " rapping:" and talking about his rap music. HE can be tangential. His flight of ides is not as rapid today. HE has not been as agitated today as he was yesterday.   A He completed his morning assessment and on it he wrote he denied SI within the past 24 hrs and he rated his depression, hopelessness and anxiety " 0/0/0", respectively. He attends music group with this Clinical research associatewriter and was very engaged in identifying how the different styles of music made him feel. His insight remains limited.   R Safety is in place andpoc cont.

## 2014-07-04 MED ORDER — TRAZODONE HCL 100 MG PO TABS
200.0000 mg | ORAL_TABLET | Freq: Every day | ORAL | Status: DC
  Administered 2014-07-05: 200 mg via ORAL
  Filled 2014-07-04 (×2): qty 2

## 2014-07-04 MED ORDER — PROPRANOLOL HCL 10 MG PO TABS
10.0000 mg | ORAL_TABLET | Freq: Three times a day (TID) | ORAL | Status: DC
  Administered 2014-07-04 – 2014-07-07 (×10): 10 mg via ORAL
  Filled 2014-07-04 (×14): qty 1

## 2014-07-04 NOTE — Progress Notes (Signed)
The focus of this group is to help patients review their daily goal of treatment and discuss progress on daily workbooks. Pt did not attend. 

## 2014-07-04 NOTE — Progress Notes (Signed)
Writer has observed the patient pacing the hallway this evening. He reports having had a good day and talked about shooting basketball and even dunking. He spoke with Clinical research associatewriter about his plans once discharged and reports that he would like to take some classes. Patient reports that he is to discharge on tomorrow and asked what time he would be leaving. Writer encouraged him to speak to his doctor about his discharge. Support given an safety maintained with 25 min checks.

## 2014-07-04 NOTE — Progress Notes (Signed)
Patient ID: James SizerBrian T [name], male   DOB: 1975/09/06, 39 y.o.   MRN: [id number] PER STATE REGULATIONS 482.30  THIS CHART WAS REVIEWED FOR MEDICAL NECESSITY WITH RESPECT TO THE PATIENT'S ADMISSION/ DURATION OF STAY.  NEXT REVIEW DATE: 07/06/2014  Willa RoughJENNIFER JONES James Treloar, RN, BSN CASE MANAGER

## 2014-07-04 NOTE — Progress Notes (Signed)
D:Patient in the hallway pacing with a peer.  Patient has been talking to peer.  Patient speech loud and tangential.  Patient labile tonight but has been more cooperative as the shift has gone on.  Patient states he has been angry today because he was not able to be discharged.  Patient states hopefully tomorrow he will be able to go home.  Patient denies SI/HI and denies AVH. A: Staff to monitor Q 15 mins for safety.  Encouragement and support offered.  Scheduled medications not administered per orders.  Patient not available for bedtime medications.  Patient sleeping and was not available for bedtime medications. R: Patient remains safe on the unit.  Patient attended group tonight.  Patient visible on the unit and interacting with peers.  Patient taking administered medications.

## 2014-07-04 NOTE — Progress Notes (Signed)
D: Pt denies SI/HI/AV. Pt is pleasant and cooperative. Pt rates depression at a 0, anxiety at a 0, and Helplessness/hopelessness at a 0. Pt is becoming aggressive and has to be redirected due to wanting to go home. He is upset he will not get to live until tomorrow.   A: Pt was offered support and encouragement. Pt was given scheduled medications. Pt was encourage to attend groups. Q 15 minute checks were done for safety.  R:Pt attends groups for a little but leaves because he has to pace in the hallway to keep calm. When not upset pt does interact well with peers and staff. Pt taking medication. Pt has no complaints besides wanting to go home.Pt receptive to treatment and safety maintained on unit.

## 2014-07-04 NOTE — Plan of Care (Signed)
Problem: Diagnosis: Increased Risk For Suicide Attempt Goal: STG-Patient Will Comply With Medication Regime Outcome: Progressing Patient has been compliant with his scheduled medications

## 2014-07-04 NOTE — [facility] Group Notes (Signed)
[facility] LCSW Group Therapy  07/04/2014 1:15 pm  Type of Therapy: Process Group Therapy  Participation Level:  Active  Participation Quality:  Appropriate  Affect:  Flat  Cognitive:  Oriented  Insight:  Improving  Engagement in Group:  Limited  Engagement in Therapy:  Limited  Modes of Intervention:  Activity, Clarification, Education, Problem-solving and Support  Summary of Progress/Problems: Today's group addressed the issue of overcoming obstacles.  Patients were asked to identify their biggest obstacle post d/c that stands in the way of their on-going success, and then problem solve as to how to manage this.  In and out of group several times.  Nodded off at one point.  Unable/unwilling to engage in conversation until another patient talked about the necessity of a red corvette to pick up females.  Arlys JohnBrian talked about his current and past vehicles.  It was clear that he enjoys driving, scary as that is for the rest of us.  Ida Rogueorth, Fathima Bartl B 07/04/2014   3:27 PM

## 2014-07-04 NOTE — [facility] Group Notes (Signed)
[facility]   07/04/2014 3:22 PM  Participation Quality:  Minimal  Mood/Affect:  Flat  Depression Rating:  denies  Anxiety Rating:  denies  Thoughts of Suicide:  No Will you contract for safety?   NA  Current AVH:  No  Plan for Discharge/Comments:  James JohnBrian was in and out of group several times.  Difficult time sitting still.  Had no complaints about the weekend.  Made it clear he is ready to leave us.    Transportation Means: family  Supports: family/ACT team  [place], James CenterRodney [name]

## 2014-07-04 NOTE — [facility] Suicide Risk Assessment (Signed)
[facility] INPATIENT:  Family/Significant Other Suicide Prevention Education  Suicide Prevention Education:  Education Completed; NO one has been identified by the patient as the family member/significant other with whom the patient will be residing, and identified as the person(s) who will aid the patient in the event of a mental health crisis (suicidal ideations/suicide attempt).  With written consent from the patient, the family member/significant other has been provided the following suicide prevention education, prior to the and/or following the discharge of the patient.  The suicide prevention education provided includes the following:  Suicide risk factors  Suicide prevention and interventions  National Suicide Hotline telephone number  [facility] assessment telephone number  [facility] Assistance 911  [facility] and/or [facility]idential [facility] Crisis [facility] telephone number  Request made of family/significant other to:  Remove weapons (e.g., guns, rifles, knives), all items previously/currently identified as safety concern.    Remove drugs/medications (over-the-counter, prescriptions, illicit drugs), all items previously/currently identified as a safety concern.  The family member/significant other verbalizes understanding of the suicide prevention education information provided.  The family member/significant other agrees to remove the items of safety concern listed above. The patient did not endorse SI at the time of admission, nor did the patient c/o SI during the stay here.  SPE not required.   However, I did talk to his mother, Ms Domenica Failartee at [phone], and encourage him to take out IVC paperwork or bring him directly here if she becomes concerned about his behavior again.  She plans of having him stay with them for awhile so she can keep an eye on him and make sure he is doing all right.  Daryel Geraldorth, [name] B 07/04/2014, 3:32 PM

## 2014-07-04 NOTE — Progress Notes (Signed)
[facility] MD Progress Note  07/04/2014 1:39 PM [name]  MRN:  [id number] Subjective:Patient states "I want to go home .'  Objective: Patient seen and chart reviewed.Patient discussed with treatment team. Patient with hx of schizoaffective disorder ,presented very delusional. Patient continues to have periods of hyperactivity , restlessness, seen pacing back and forth in the hallway , singing songs and being very loud. Patient this AM ,became upset when told that he will not be discharged today. Patient however was redirectable and calmed down and apologized for his behavior. Patient has been getting prn Risperdal the past few days , but none today. Patient has been attending group activities and taking his medications. Denies SI/HI/AH/VH today. However reports sleep as not good last night due to being anxious about discharge. Encouraged pt to take his medications , be calm , and attend groups. CSW will work on disposition.   Principal Problem: Schizoaffective disorder, bipolar type Diagnosis:   Primary Psychiatric Diagnosis: Schizoaffective disorder,Bipolar type ,multiple episodes ,currently in acute episode   Secondary Psychiatric Diagnosis: Tobacco use disorder, severe   Non Psychiatric Diagnosis: See pmh Patient Active Problem List   Diagnosis Date Noted  . Schizoaffective disorder, bipolar type [F25.0] 06/29/2014   Total Time spent with patient: 30 minutes   Past Medical History:  Past Medical History  Diagnosis Date  . Paranoid schizophrenia   . Hyperlipidemia   . Schizophrenia   . Bipolar disorder     Past Surgical History  Procedure Laterality Date  . Testicle surgery     Family History:  Family History  Problem Relation Age of Onset  . Schizophrenia Neg Hx    Social History:  History  Alcohol Use No     History  Drug Use No    History   Social History  . Marital Status: Single    Spouse Name: N/A  . Number of Children: N/A  . Years of Education: N/A    Social History Main Topics  . Smoking status: Current Every Day Smoker -- 1.00 packs/day    Types: Cigarettes  . Smokeless tobacco: Not on file  . Alcohol Use: No  . Drug Use: No  . Sexual Activity: Yes    Birth Control/ Protection: Condom   Other Topics Concern  . None   Social History Narrative   Additional History:    Sleep: Fair  Appetite:  Fair     Musculoskeletal: Strength & Muscle Tone: within normal limits Gait & Station: normal Patient leans: N/A   Psychiatric Specialty Exam: Physical Exam  Review of Systems  Psychiatric/Behavioral: The patient is nervous/anxious and has insomnia.     Blood pressure 107/72, pulse 88, temperature 98.7 F (37.1 C), temperature source Oral, resp. rate 20, height  (1.778 m), weight 113.399 kg (250 lb), SpO2 98 %.Body mass index is 35.87 kg/(m^2).  General Appearance: Fairly Groomed  Patent attorney::  Good  Speech:  Pressured improving  Volume:  Increased singing rap music ,being loud  Mood:  Euphoric improving  Affect:  Labile  Thought Process:  more organized  Orientation:  Full (Time, Place, and Person)  Thought Content:  Delusions and Paranoid Ideation improving  Suicidal Thoughts:  No  Homicidal Thoughts:  No  Memory:  Immediate;   Fair Recent;   Fair Remote;   Fair  Judgement:  Impaired  Insight:  Lacking  Psychomotor Activity:  Restlessness  Concentration:  Fair  Recall:  [employer] of Knowledge:Fair  Language: Fair  Akathisia:  pt  seen pacing in the hallways ,unknown if this is anxiety or akathisia  Handed:  Right  AIMS (if indicated):   0  Assets:  Communication Skills Desire for Improvement  ADL's:  Intact  Cognition: WNL  Sleep:  Number of Hours: 2     Current Medications: Current Facility-Administered Medications  Medication Dose Route Frequency Provider Last Rate Last Dose  . acetaminophen (TYLENOL) tablet 650 mg  650 mg Oral Q6H PRN Sanjuana KavaAgnes I Nwoko, NP   650 mg at 07/04/14 0142  . alum &  mag hydroxide-simeth (MAALOX/MYLANTA) 200-200-20 MG/5ML suspension 30 mL  30 mL Oral Q4H PRN Sanjuana KavaAgnes I Nwoko, NP   30 mL at 07/02/14 1335  . ARIPiprazole (ABILIFY) tablet 10 mg  10 mg Oral Daily Jomarie LongsSaramma Ahriana Gunkel, MD   10 mg at 07/04/14 0734  . ARIPiprazole (ABILIFY) tablet 15 mg  15 mg Oral QPM Humphrey Guerreiro, MD   15 mg at 07/03/14 1722  . benztropine (COGENTIN) tablet 1 mg  1 mg Oral BID Jomarie LongsSaramma Haakon Titsworth, MD   1 mg at 07/04/14 0734  . gabapentin (NEURONTIN) capsule 100 mg  100 mg Oral TID Jomarie LongsSaramma Alejandria Wessells, MD   100 mg at 07/04/14 1200  . hydrOXYzine (ATARAX/VISTARIL) tablet 25 mg  25 mg Oral Q6H PRN Sanjuana KavaAgnes I Nwoko, NP   25 mg at 07/01/14 0331  . lamoTRIgine (LAMICTAL) tablet 25 mg  25 mg Oral BH-qamhs Jomarie LongsSaramma Ezrah Panning, MD   25 mg at 07/04/14 0734  . magnesium hydroxide (MILK OF MAGNESIA) suspension 30 mL  30 mL Oral Daily PRN Sanjuana KavaAgnes I Nwoko, NP      . multivitamin with minerals tablet 1 tablet  1 tablet Oral Daily Sanjuana KavaAgnes I Nwoko, NP   1 tablet at 07/04/14 0734  . nicotine (NICODERM CQ - dosed in mg/24 hours) patch 21 mg  21 mg Transdermal Q0600 Sanjuana KavaAgnes I Nwoko, NP   21 mg at 07/03/14 0733  . nicotine polacrilex (NICORETTE) gum 2 mg  2 mg Oral PRN Jomarie LongsSaramma Leni Pankonin, MD   2 mg at 07/02/14 0812  . propranolol (INDERAL) tablet 10 mg  10 mg Oral TID Jomarie LongsSaramma Maddy Graham, MD   10 mg at 07/04/14 1153  . risperiDONE (RISPERDAL M-TABS) disintegrating tablet 2 mg  2 mg Oral Q8H PRN Kerry HoughSpencer E Simon, PA-C   2 mg at 07/03/14 [id number]   And  . ziprasidone (GEODON) injection 20 mg  20 mg Intramuscular PRN Kerry HoughSpencer E Simon, PA-C      . traZODone (DESYREL) tablet 200 mg  200 mg Oral QHS Jomarie LongsSaramma Devon Kingdon, MD        Lab Results:  No results found for this or any previous visit (from the past 48 hour(s)).  Physical Findings: AIMS: Facial and Oral Movements Muscles of Facial Expression: None, normal Lips and Perioral Area: None, normal Jaw: None, normal Tongue: None, normal,Extremity Movements Upper (arms, wrists, hands, fingers): None,  normal Lower (legs, knees, ankles, toes): None, normal, Trunk Movements Neck, shoulders, hips: None, normal, Overall Severity Severity of abnormal movements (highest score from questions above): None, normal Incapacitation due to abnormal movements: None, normal Patient's awareness of abnormal movements (rate only patient's report): No Awareness,    CIWA:    COWS:     Assessment: Patient presented delusional as well as disorganized. Patient continues to be loud periodically, seen as restless, pacing and having sleep issues.  Treatment Plan Summary: Daily contact with patient to assess and evaluate symptoms and progress in treatment and Medication management Will continue Abilify 10  mg po daily and 15 mg po qpm.  Patient  given Abilify Maintena 400 mg IM on 06/30/14 . Will add Propranolol 10 mg po tid for ?akathisia. Will continue Lamictal 25 mg po bid. Will continue Gabapentin 100 mg po tid to augment the effect of Lamictal. IncreaseTrazodone to 200 mg po qhs for sleep. Discussed risk of smoking , provided counseling as well as nicotine gums. Will make available prn medications for agitation/anxiety. Will continue to monitor vitals ,medication compliance and treatment side effects while patient is here.  Will monitor for medical issues as well as call consult as needed.  Reviewed labs.TSH - WNL. CSW will start working on disposition.  Patient to participate in therapeutic milieu .     Medical Decision Making:  Review of Psycho-Social Stressors (1), Review and summation of old records (2), Order AIMS Test (2), Review of Last Therapy Session (1), Review of Medication Regimen & Side Effects (2) and Review of New Medication or Change in Dosage (2)     Dottie Vaquerano MD 07/04/2014, 1:39 PM

## 2014-07-04 NOTE — Tx Team (Signed)
  Interdisciplinary Treatment Plan Update   Date Reviewed:  07/04/2014  Time Reviewed:  8:30 AM  Progress in Treatment:   Attending groups: Yes Participating in groups: Minimally Taking medication as prescribed: Yes  Tolerating medication: Yes Family/Significant other contact made: Yes  Patient understands diagnosis: Yes  Discussing patient identified problems/goals with staff: Yes  See initial care plan Medical problems stabilized or resolved: Yes Denies suicidal/homicidal ideation: Yes  In tx team Patient has not harmed self or others: Yes  For review of initial/current patient goals, please see plan of care.  Estimated Length of Stay:  1-3 days  Reason for Continuation of Hospitalization: Delusions  Medication stabilization Other; describe Mood instability  New Problems/Goals identified:  N/A  Discharge Plan or Barriers:   return home, follow up with ACT team  Additional Comments:  "I want to go home ."  Patient with hx of schizoaffective disorder ,presented very delusional. Patient continues to have periods of hyperactivity , restlessness, seen pacing back and forth in the hallway , singing songs and being very loud. Patient this AM ,became upset when told that he will not be discharged today. Patient however was redirectable and calmed down and apologized for his behavior. Patient has been getting prn Risperdal  since the day of admission  It has varied between AM and PM, but is always given for increased agitation.  No significant med changes today.  Attendees:  Signature: Ivin BootySarama Eappen, MD 07/04/2014 8:30 AM   Signature: Richelle Itood Poseidon Pam, LCSW 07/04/2014 8:30 AM  Signature: Fransisca KaufmannLaura Davis, NP 07/04/2014 8:30 AM  Signature: Joslyn Devonaroline Beaudry, RN 07/04/2014 8:30 AM  Signature: Liborio NixonPatrice White, RN 07/04/2014 8:30 AM  Signature:  07/04/2014 8:30 AM  Signature:   07/04/2014 8:30 AM  Signature:    Signature:    Signature:    Signature:    Signature:    Signature:      Scribe for Treatment Team:    Richelle Itood Kymiah Araiza, LCSW  07/04/2014 8:30 AM

## 2014-07-05 MED ORDER — HYDROXYZINE HCL 25 MG PO TABS
25.0000 mg | ORAL_TABLET | Freq: Four times a day (QID) | ORAL | Status: DC | PRN
  Administered 2014-07-06: 25 mg via ORAL
  Filled 2014-07-05 (×2): qty 1
  Filled 2014-07-05: qty 2
  Filled 2014-07-05: qty 6

## 2014-07-05 MED ORDER — ARIPIPRAZOLE 10 MG PO TABS
20.0000 mg | ORAL_TABLET | Freq: Every evening | ORAL | Status: DC
  Administered 2014-07-05 – 2014-07-06 (×2): 20 mg via ORAL
  Filled 2014-07-05 (×3): qty 2

## 2014-07-05 MED ORDER — TEMAZEPAM 15 MG PO CAPS
15.0000 mg | ORAL_CAPSULE | Freq: Every day | ORAL | Status: DC
  Administered 2014-07-05: 15 mg via ORAL
  Filled 2014-07-05: qty 1

## 2014-07-05 NOTE — Progress Notes (Signed)
D: Patient in his room in bed on [facility].  Patient states he had a good day.  Patient states he is looking forward to going home on Thursday.  Patient denies SI/HI and denies AVH.  Patient does talk to himself and raps songs while pacing in the hallway.  Patient states his goal for today was to stay focused. A: Staff to monitor Q 15 mins for safety.  Encouragement and support offered.  Scheduled medications administered per orders.  Vistaril administered prn. R: Patient remains safe on the unit.  Patient attended group tonight.  Patient visible on the unit.  Patient taking administered medications

## 2014-07-05 NOTE — [facility] Group Notes (Signed)
[facility] LCSW Group Therapy  07/05/2014 , 3:32 PM   Type of Therapy:  Group Therapy  Participation Level:  Active  Participation Quality:  Attentive  Affect:  Appropriate  Cognitive:  Alert  Insight:  Improving  Engagement in Therapy:  Engaged  Modes of Intervention:  Discussion, Exploration and Socialization  Summary of Progress/Problems: Today's group focused on the term Diagnosis.  Participants were asked to define the term, and then pronounce whether it is a negative, positive or neutral term.  [name] stayed for the entire group and was appropriate.  He participated in a limited way.  Concrete.    [name], [name] 07/05/2014 , 3:32 PM

## 2014-07-05 NOTE — Progress Notes (Signed)
Patient came up to the nurses station and is upset because he states he is supposed to take Trazodone at night.  Patient states his bedtime medication was changed to Restoril and states it is making his penis numb.  Patient is pacing and is agitated and cursing on the phone.

## 2014-07-05 NOTE — Progress Notes (Signed)
[facility]  07/05/2014 3:07 PM Nichola SizerBrian T Anaya  MRN:  [id number] Subjective:Patient states "I am OK, I don't remember what happened last night."  Objective: Patient seen and chart reviewed.Patient discussed with treatment team. Patient with hx of schizoaffective disorder ,presented very delusional. Patient with some improvement of his  Restlessness. Patient seen as more appropriate , not too loud in the hallway, appropriate in groups. Patient does have periods when he has been requiring his prn Risperdal. Patient however has not exhibited any agitation today.  Denies SI/HI/AH/VH today.Reported sleep issues last night, will change his sleep medication. Encouraged pt to take his medications , be calm , and attend groups. CSW will work on disposition.       Principal Problem: Schizoaffective disorder, bipolar type Diagnosis:   Primary Psychiatric Diagnosis: Schizoaffective disorder,Bipolar type ,multiple episodes ,currently in acute episode   Secondary Psychiatric Diagnosis: Tobacco use disorder, severe   Non Psychiatric Diagnosis: See pmh Patient Active Problem List   Diagnosis Date Noted  . Schizoaffective disorder, bipolar type [F25.0] 06/29/2014   Total Time spent with patient: 30 minutes        Past Medical History:  Past Medical History  Diagnosis Date  . Paranoid schizophrenia   . Hyperlipidemia   . Schizophrenia   . Bipolar disorder     Past Surgical History  Procedure Laterality Date  . Testicle surgery     Family History:  Family History  Problem Relation Age of Onset  . Schizophrenia Neg Hx    Social History:  History  Alcohol Use No     History  Drug Use No    History   Social History  . Marital Status: Single    Spouse Name: N/A  . Number of Children: N/A  . Years of Education: N/A   Social History Main Topics  . Smoking status: Current Every Day Smoker -- 1.00 packs/day    Types: Cigarettes  . Smokeless tobacco: Not on file   . Alcohol Use: No  . Drug Use: No  . Sexual Activity: Yes    Birth Control/ Protection: Condom   Other Topics Concern  . None   Social History Narrative   Additional History:    Sleep: Fair  Appetite:  Fair     Musculoskeletal: Strength & Muscle Tone: within normal limits Gait & Station: normal Patient leans: N/A   Psychiatric Specialty Exam: Physical Exam  ROS  Blood pressure 124/74, pulse 81, temperature 98.4 F (36.9 C), temperature source Oral, resp. rate 20, height 5\' 10"  (1.778 m), weight 113.399 kg (250 lb), SpO2 98 %.Body mass index is 35.87 kg/(m^2).  General Appearance: Fairly Groomed  Patent attorneyye Contact::  Good  Speech:  Pressured improving  Volume:  Increased varies  Mood:  Euphoric improving  Affect:  Labile  Thought Process:  more organized  Orientation:  Full (Time, Place, and Person)  Thought Content:  Delusions and Paranoid Ideation improving  Suicidal Thoughts:  No  Homicidal Thoughts:  No  Memory:  Immediate;   Fair Recent;   Fair Remote;   Fair  Judgement:  Impaired  Insight:  Lacking  Psychomotor Activity:  Restlessness improving  Concentration:  Fair  Recall:  [employer]  Fund of Knowledge:Fair  Language: Fair  Akathisia:  No  Handed:  Right  AIMS (if indicated):   0  Assets:  Communication Skills Desire for Improvement  ADL's:  Intact  Cognition: WNL  Sleep:  Number of Hours: 6.5     Current Medications:  Current Facility-Administered Medications  Medication Dose Route Frequency Provider Last Rate Last Dose  . acetaminophen (TYLENOL) tablet 650 mg  650 mg Oral Q6H PRN Sanjuana Kava, NP   650 mg at 07/05/14 1157  . alum & mag hydroxide-simeth (MAALOX/MYLANTA) 200-200-20 MG/5ML suspension 30 mL  30 mL Oral Q4H PRN Sanjuana Kava, NP   30 mL at 07/05/14 1306  . ARIPiprazole (ABILIFY) tablet 10 mg  10 mg Oral Daily Jomarie Longs, MD   10 mg at 07/05/14 0731  . ARIPiprazole (ABILIFY) tablet 20 mg  20 mg Oral QPM Amen Staszak, MD      .  benztropine (COGENTIN) tablet 1 mg  1 mg Oral BID Jomarie Longs, MD   1 mg at 07/05/14 0731  . hydrOXYzine (ATARAX/VISTARIL) tablet 25 mg  25 mg Oral Q6H PRN Jomarie Longs, MD      . lamoTRIgine (LAMICTAL) tablet 25 mg  25 mg Oral BH-qamhs Azlee Monforte, MD   25 mg at 07/05/14 0731  . magnesium hydroxide (MILK OF MAGNESIA) suspension 30 mL  30 mL Oral Daily PRN Sanjuana Kava, NP      . multivitamin with minerals tablet 1 tablet  1 tablet Oral Daily Sanjuana Kava, NP   1 tablet at 07/05/14 0732  . nicotine (NICODERM CQ - dosed in mg/24 hours) patch 21 mg  21 mg Transdermal Q0600 Sanjuana Kava, NP   21 mg at 07/03/14 0733  . nicotine polacrilex (NICORETTE) gum 2 mg  2 mg Oral PRN Jomarie Longs, MD   2 mg at 07/04/14 1903  . propranolol (INDERAL) tablet 10 mg  10 mg Oral TID Jomarie Longs, MD   10 mg at 07/05/14 1158  . temazepam (RESTORIL) capsule 15 mg  15 mg Oral QHS Jomarie Longs, MD        Lab Results:  No results found for this or any previous visit (from the past 48 hour(s)).  Physical Findings: AIMS: Facial and Oral Movements Muscles of Facial Expression: None, normal Lips and Perioral Area: None, normal Jaw: None, normal Tongue: None, normal,Extremity Movements Upper (arms, wrists, hands, fingers): None, normal Lower (legs, knees, ankles, toes): None, normal, Trunk Movements Neck, shoulders, hips: None, normal, Overall Severity Severity of abnormal movements (highest score from questions above): None, normal Incapacitation due to abnormal movements: None, normal Patient's awareness of abnormal movements (rate only patient's report): No Awareness, Dental Status Current problems with teeth and/or dentures?: No Does patient usually wear dentures?: No  CIWA:    COWS:     Assessment: Patient presented delusional as well as disorganized. Patient continues to be loud periodically, seen as restless, pacing and having sleep issues.  Treatment Plan Summary: Daily contact with  patient to assess and evaluate symptoms and progress in treatment and Medication management Will increase Abilify to 10 mg po daily and 20 mg po qpm.  Patient  given Abilify Maintena 400 mg IM on 06/30/14 .AIMS - 0(07/05/14) Will continue  Propranolol 10 mg po tid for ?akathisia. Will continue Lamictal 25 mg po bid. Will discontinue gabapentin. Will discontinue Trazodone - will start Restoril 15 mg po qhs. Discussed risk of smoking , provided counseling as well as nicotine gums. Will make available prn medications for agitation/anxiety. CSW will start working on disposition.  Patient to participate in therapeutic milieu .     Medical Decision Making:  Review of Psycho-Social Stressors (1), Review and summation of old records (2), Order AIMS Test (2), Review of Last Therapy Session (  1), Review of Medication Regimen & Side Effects (2) and Review of New Medication or Change in Dosage (2)     Yussef Jorge MD 07/05/2014, 3:07 PM

## 2014-07-05 NOTE — Progress Notes (Signed)
Patient came up and took his bedtime medications.

## 2014-07-05 NOTE — Progress Notes (Signed)
The focus of this group is to help patients review their daily goal of treatment and discuss progress on daily workbooks. Pt attended the evening group session but had difficulty staying awake during it, even when directly addressed. Pt admitted he needed rest and apologized for falling asleep in group, but was pleasant.

## 2014-07-05 NOTE — [facility] Group Notes (Signed)
[facility] Group Notes:  (Nursing/MHT/Case Management/Adjunct)  Date:  07/05/2014  Time:  10:47 AM  Type of Therapy:  Nurse Education  Participation Level:  Active  Participation Quality:  Appropriate and Attentive  Affect:  Blunted  Cognitive:  Alert, Appropriate and Oriented  Insight:  Improving  Engagement in Group:  Engaged  Modes of Intervention:  Discussion, Education and Support  Summary of Progress/Problems: [name] was appropriate in group. He shared his goal was to cope by exercising, even if that meant just walking in the hall.   Maurine SimmeringShugart, Aldwin Micalizzi M 07/05/2014, 10:47 AM

## 2014-07-05 NOTE — Progress Notes (Signed)
D: Pt denies SI/HI/AVH. He has been calm and cooperative today, walking in the halls some for exercise. He had hoped for discharge today, but now says he will leave Thursday. He was accepting of that. Pt declined Neurontin this a.m. because he thinks it numbs his genital area.   A: Medications, except for Neurontin, given as ordered. Q15 safety checks maintained. Support/encouragement provided. Maalox given after lunch for indigestion, with relief.   R: Pt remains safe and continues with treatment plan. Will continue to monitor for needs/safety.

## 2014-07-06 MED ORDER — ENSURE COMPLETE PO LIQD
237.0000 mL | Freq: Two times a day (BID) | ORAL | Status: DC
  Administered 2014-07-06 – 2014-07-07 (×3): 237 mL via ORAL

## 2014-07-06 MED ORDER — TEMAZEPAM 15 MG PO CAPS
30.0000 mg | ORAL_CAPSULE | Freq: Every day | ORAL | Status: DC
  Administered 2014-07-06: 30 mg via ORAL
  Filled 2014-07-06: qty 2

## 2014-07-06 NOTE — Progress Notes (Addendum)
[facility] MD Progress Note  07/06/2014 3:08 PM [name]  MRN:  [id number] Subjective:Patient states "It happened again last night ,I am sorry.'  Objective: Patient seen and chart reviewed.Patient discussed with treatment team. Patient with hx of schizoaffective disorder ,presented very delusional. Patient today found with poor eye contact , reports feeling depressed about his behavior last night. Per staff patient was provoked by another pt on the unit. Pt became loud and was yelling , but was redirectable and Vistaril prn calmed him down. Pt also with complaints of Restoril causing his genitals to be numb . Discussed with patient that Trazodone was changed since he was having sleep difficulties inspite of being on 200 mg of Trazodone. Pt agreeable to take Restoril tonight. Will increase the dose to help with sleep.  Denies SI/HI/AH/VH today. Encouraged pt to take his medications , be calm , and attend groups. CSW will work on disposition.       Principal Problem: Schizoaffective disorder, bipolar type Diagnosis:   Primary Psychiatric Diagnosis: Schizoaffective disorder,Bipolar type ,multiple episodes ,currently in acute episode   Secondary Psychiatric Diagnosis: Tobacco use disorder, severe   Non Psychiatric Diagnosis: See pmh Patient Active Problem List   Diagnosis Date Noted  . Schizoaffective disorder, bipolar type [F25.0] 06/29/2014   Total Time spent with patient: 30 minutes        Past Medical History:  Past Medical History  Diagnosis Date  . Paranoid schizophrenia   . Hyperlipidemia   . Schizophrenia   . Bipolar disorder     Past Surgical History  Procedure Laterality Date  . Testicle surgery     Family History:  Family History  Problem Relation Age of Onset  . Schizophrenia Neg Hx    Social History:  History  Alcohol Use No     History  Drug Use No    History   Social History  . Marital Status: Single    Spouse Name: N/A  . Number of  Children: N/A  . Years of Education: N/A   Social History Main Topics  . Smoking status: Current Every Day Smoker -- 1.00 packs/day    Types: Cigarettes  . Smokeless tobacco: Not on file  . Alcohol Use: No  . Drug Use: No  . Sexual Activity: Yes    Birth Control/ Protection: Condom   Other Topics Concern  . None   Social History Narrative   Additional History:    Sleep: Poor  Appetite:  Fair     Musculoskeletal: Strength & Muscle Tone: within normal limits Gait & Station: normal Patient leans: N/A   Psychiatric Specialty Exam: Physical Exam  Review of Systems  Psychiatric/Behavioral: The patient is nervous/anxious and has insomnia.     Blood pressure 130/76, pulse 76, temperature 98.1 F (36.7 C), temperature source Oral, resp. rate 17, height  (1.778 m), weight 113.399 kg (250 lb), SpO2 98 %.Body mass index is 35.87 kg/(m^2).  General Appearance: Fairly Groomed  Patent attorney::  Good  Speech:  Pressured improving  Volume:  Normal   Mood:  Anxious improving  Affect:  Labile  Thought Process:  more organized  Orientation:  Full (Time, Place, and Person)  Thought Content:  Delusions and Paranoid Ideation improving  Suicidal Thoughts:  No  Homicidal Thoughts:  No  Memory:  Immediate;   Fair Recent;   Fair Remote;   Fair  Judgement:  Impaired  Insight:  Lacking  Psychomotor Activity:  Restlessness improving  Concentration:  Fair  Recall:  Fair  Progress EnergyFund of Knowledge:Fair  Language: Fair  Akathisia:  No  Handed:  Right  AIMS (if indicated):   0  Assets:  Communication Skills Desire for Improvement  ADL's:  Intact  Cognition: WNL  Sleep:  Number of Hours: 6.5     Current Medications: Current Facility-Administered Medications  Medication Dose Route Frequency Provider Last Rate Last Dose  . acetaminophen (TYLENOL) tablet 650 mg  650 mg Oral Q6H PRN Sanjuana KavaAgnes I Nwoko, NP   650 mg at 07/06/14 1014  . alum & mag hydroxide-simeth (MAALOX/MYLANTA) 200-200-20  MG/5ML suspension 30 mL  30 mL Oral Q4H PRN Sanjuana KavaAgnes I Nwoko, NP   30 mL at 07/05/14 1306  . ARIPiprazole (ABILIFY) tablet 10 mg  10 mg Oral Daily Jomarie LongsSaramma Columbia Pandey, MD   10 mg at 07/06/14 0843  . ARIPiprazole (ABILIFY) tablet 20 mg  20 mg Oral QPM Juvon Teater, MD   20 mg at 07/05/14 1724  . benztropine (COGENTIN) tablet 1 mg  1 mg Oral BID Jomarie LongsSaramma Armya Westerhoff, MD   1 mg at 07/06/14 0843  . feeding supplement (ENSURE COMPLETE) (ENSURE COMPLETE) liquid 237 mL  237 mL Oral BID BM Renesmay Nesbitt, MD   237 mL at 07/06/14 1418  . hydrOXYzine (ATARAX/VISTARIL) tablet 25 mg  25 mg Oral Q6H PRN Jomarie LongsSaramma Darey Hershberger, MD      . lamoTRIgine (LAMICTAL) tablet 25 mg  25 mg Oral BH-qamhs Shacoria Latif, MD   25 mg at 07/06/14 0843  . magnesium hydroxide (MILK OF MAGNESIA) suspension 30 mL  30 mL Oral Daily PRN Sanjuana KavaAgnes I Nwoko, NP      . multivitamin with minerals tablet 1 tablet  1 tablet Oral Daily Sanjuana KavaAgnes I Nwoko, NP   1 tablet at 07/06/14 [phone]  . nicotine (NICODERM CQ - dosed in mg/24 hours) patch 21 mg  21 mg Transdermal Q0600 Sanjuana KavaAgnes I Nwoko, NP   21 mg at 07/03/14 0733  . nicotine polacrilex (NICORETTE) gum 2 mg  2 mg Oral PRN Jomarie LongsSaramma Taylinn Brabant, MD   2 mg at 07/04/14 1903  . propranolol (INDERAL) tablet 10 mg  10 mg Oral TID Jomarie LongsSaramma Patsey Pitstick, MD   10 mg at 07/06/14 1156  . temazepam (RESTORIL) capsule 30 mg  30 mg Oral QHS Jomarie LongsSaramma Natassia Guthridge, MD        Lab Results:  No results found for this or any previous visit (from the past 48 hour(s)).  Physical Findings: AIMS: Facial and Oral Movements Muscles of Facial Expression: None, normal Lips and Perioral Area: None, normal Jaw: None, normal Tongue: None, normal,Extremity Movements Upper (arms, wrists, hands, fingers): None, normal Lower (legs, knees, ankles, toes): None, normal, Trunk Movements Neck, shoulders, hips: None, normal, Overall Severity Severity of abnormal movements (highest score from questions above): None, normal Incapacitation due to abnormal movements: None,  normal Patient's awareness of abnormal movements (rate only patient's report): No Awareness, Dental Status Current problems with teeth and/or dentures?: No Does patient usually wear dentures?: No  CIWA:    COWS:     Assessment: Patient presented delusional as well as disorganized. Patient continues to have sleep issues , but overall good response to medications.  Treatment Plan Summary: Daily contact with patient to assess and evaluate symptoms and progress in treatment and Medication management Will continue Abilify  10 mg po daily and 20 mg po qpm.  Patient  given Abilify Maintena 400 mg IM on 06/30/14 .AIMS - 0(07/05/14) Will continue  Propranolol 10 mg po tid for ?akathisia. Will continue Lamictal  25 mg po bid. Will increase Restoril to 30 mg po qhs for sleep. Discussed risk of smoking , provided counseling as well as nicotine gums. Will make available prn medications for agitation/anxiety. CSW will start working on disposition.  Patient to participate in therapeutic milieu .     Medical Decision Making:  Review of Psycho-Social Stressors (1), Order AIMS Test (2), New Problem, with no additional work-up planned (3), Review of Last Therapy Session (1), Review of Medication Regimen & Side Effects (2) and Review of New Medication or Change in Dosage (2)     Sharifa Bucholz MD 07/06/2014, 3:08 PM

## 2014-07-06 NOTE — Progress Notes (Signed)
Patient ID: [name], male   DOB: 1975-08-27, 39 y.o.   MRN: [id number] PER STATE REGULATIONS 482.30  THIS CHART WAS REVIEWED FOR MEDICAL NECESSITY WITH RESPECT TO THE PATIENT'S ADMISSION/ DURATION OF STAY.  NEXT REVIEW DATE: 07/10/2014  Willa RoughJENNIFER JONES Charlotte Brafford, RN, BSN CASE MANAGER

## 2014-07-06 NOTE — [facility] Group Notes (Signed)
[facility]  07/06/2014 , 1:27 PM    Type of Therapy:  [facility] Presentation  Participation Level:  Active  Participation Quality:  Attentive  Affect:  Blunted  Cognitive:  Oriented  Insight:  Limited  Engagement in Therapy:  Engaged  Modes of Intervention:  Discussion, Education and Socialization  Summary of Progress/Problems:  Onalee HuaDavid from [facility] came to present his recovery story and play the guitar.  Arlys JohnBrian sat quietly.  He slept for part of the time.  Daryel Geraldorth, Nishant Schrecengost B 07/06/2014 , 1:27 PM

## 2014-07-06 NOTE — Plan of Care (Signed)
Problem: Diagnosis: Increased Risk For Suicide Attempt Goal: LTG-Patient Will Report Improvement in Psychotic Symptoms LTG (by discharge) : Patient will report improvement in psychotic symptoms.  Outcome: Progressing Patient states he is feeling better and ready for discharge tomorrow.  Patient has been medication compliant and states he will continue his medications when discharged.

## 2014-07-06 NOTE — Progress Notes (Addendum)
Patient ID: James SizerBrian T [name], male   DOB: May 11, 1975, 39 y.o.   MRN: [id number]  Pt currently presents with an animated affect and cooperative behavior. Per self inventory, pt rates depression, hopelessness and anxiety at a 0. [name] daily goal is "going home tomorrow" and they intend to do so by "stay focused." Pt reports good sleep, concentration and appetite.   Pt provided with medications per providers orders. [name] labs and vitals were monitored throughout the day. Pt supported emotionally and encouraged to express concerns and questions. Pt educated on diet/nutrition.   [name] safety ensured with 15 minute and environmental checks. Pt currently denies SI/HI and A/V hallucinations. Pt verbally agrees to seek staff if SI/HI or A/VH occurs and to consult with staff before acting on these thoughts. Pt frequently states "I'm hungry" and asks for an ensure numerous times before lunch today. Education given. Pt has increasingly bizarre behavior throughout the day. Pt seems to be agitated by another pt on the unit.

## 2014-07-06 NOTE — Progress Notes (Signed)
D: Patient in the hallway and appear anxious and laughing inappropriately.  Patient states he has been having flashbacks and that was upsetting him so he laughs instead.  Patient has had to be redirected in the hallway a couple of times tonight because he has been rapping songs and talking loudly on the unit.  Patient does calm down when asked to lower his voice. Patient states he feels he became hyper after drinking coffee this evening. Patient denies SI/HI and denies AVH. A: Staff to monitor Q 15 mins for safety.  Encouragement and support offered.  Scheduled medications administered per orders. R: Patient remains safe on the unit.  Patient visible on the unit and interacting with peers.  Patient taking administered medications.

## 2014-07-06 NOTE — Progress Notes (Signed)
  [facility] :  Will you be returning to the same living situation after discharge:  Yes,  home At discharge, do you have transportation home?: Yes,  family Do you have the ability to pay for your medications: Yes,  MCD  Release of information consent forms completed and in the chart;  Patient's signature needed at discharge.  Patient to Follow up at: Follow-up Information    Follow up with [facility] On 07/08/2014.   Why:  Someone from the ACT team will see you on Friday late morning   Contact information:   [address]  [place]  [phone]      Patient denies SI/HI: Yes,  yes    Safety Planning and Suicide Prevention discussed: Yes,  yes  Have you used any form of tobacco in the last 30 days? (Cigarettes, Smokeless Tobacco, Cigars, and/or Pipes): Yes  Has patient been referred to the Quitline?: Patient refused referral  Ida Rogueorth, Nai Borromeo B 07/06/2014, 3:34 PM

## 2014-07-07 ENCOUNTER — Encounter (HOSPITAL_COMMUNITY): Payer: Self-pay | Admitting: Registered Nurse

## 2014-07-07 DIAGNOSIS — F17200 Nicotine dependence, unspecified, uncomplicated: Secondary | ICD-10-CM | POA: Insufficient documentation

## 2014-07-07 MED ORDER — ARIPIPRAZOLE 10 MG PO TABS: ORAL_TABLET | ORAL | Status: DC

## 2014-07-07 MED ORDER — TEMAZEPAM 30 MG PO CAPS: 30.0000 mg | ORAL_CAPSULE | Freq: Every day | ORAL | Status: DC

## 2014-07-07 MED ORDER — PROPRANOLOL HCL 10 MG PO TABS: 10.0000 mg | ORAL_TABLET | Freq: Three times a day (TID) | ORAL | Status: DC

## 2014-07-07 MED ORDER — ATORVASTATIN CALCIUM 20 MG PO TABS: 20.0000 mg | ORAL_TABLET | Freq: Every day | ORAL | Status: DC

## 2014-07-07 MED ORDER — BENZTROPINE MESYLATE 1 MG PO TABS: 1.0000 mg | ORAL_TABLET | Freq: Two times a day (BID) | ORAL | Status: DC

## 2014-07-07 MED ORDER — HYDROXYZINE HCL 50 MG PO TABS
50.0000 mg | ORAL_TABLET | Freq: Once | ORAL | Status: AC
  Administered 2014-07-07: 50 mg via ORAL
  Filled 2014-07-07: qty 1

## 2014-07-07 MED ORDER — LAMOTRIGINE 25 MG PO TABS: 25.0000 mg | ORAL_TABLET | ORAL | Status: DC

## 2014-07-07 MED ORDER — HYDROXYZINE HCL 25 MG PO TABS: 25.0000 mg | ORAL_TABLET | Freq: Four times a day (QID) | ORAL | Status: DC | PRN

## 2014-07-07 NOTE — Progress Notes (Signed)
Patient ID: James SizerBrian T [name], male   DOB: July 25, 1975, 39 y.o.   MRN: [id number]  Pt discharged home with his mother. Pt was stable and appreciative. All papers and prescriptions were given and valuables returned. Verbal understanding expressed. Denies SI/HI and A/VH. Pt given opportunity to express concerns and ask questions. Pt able to verbalize coping skills to utilize outside of the facility.

## 2014-07-07 NOTE — Progress Notes (Signed)
Patient has not slept well tonight.  Patient will go to sleep in small intervals and wake up to see what time it is.  Patient states he is anxious about discharged and he states he is a night person.  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  Patient states he works 6pm-6am.  Patient also states he does not understand why we are trying to make him into day person when he is a night person.

## 2014-07-07 NOTE — Progress Notes (Signed)
Patient ID: [name], male   DOB: December 09, 1975, 39 y.o.   MRN: [id number]   Adult Psychoeducational Group Note  Date:  07/07/2014 Time: 09:15  Group Topic/Focus:  Orientation:   The focus of this group is to educate the patient on the purpose and policies of crisis stabilization and provide a format to answer questions about their admission.  The group details unit policies and expectations of patients while admitted.  Participation Level:  Active  Participation Quality:  Appropriate  Affect:  Appropriate  Cognitive:  Appropriate  Insight: Appropriate  Engagement in Group:  Improving  Modes of Intervention:  Discussion, Education and Support  Additional Comments:  Pt able to identify one short and long term goal.   Aurora Maskwyman, Montserrat Shek E 07/07/2014, 12:33 PM
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
## 2014-07-07 NOTE — [facility] Suicide Risk Assessment (Signed)
[facility] Discharge Suicide Risk Assessment   Demographic Factors:  Male  Total Time spent with patient: 30 minutes  Musculoskeletal: Strength & Muscle Tone: within normal limits Gait & Station: normal Patient leans: N/A  Psychiatric Specialty Exam: Physical Exam  Review of Systems  Psychiatric/Behavioral: Negative for depression, suicidal ideas, hallucinations and substance abuse. The patient has insomnia (STABLE). The patient is not nervous/anxious.     Blood pressure 121/81, pulse 72, temperature 98.7 F (37.1 C), temperature source Oral, resp. rate 18, height 5\' 10"  (1.778 m), weight 113.399 kg (250 lb), SpO2 98 %.Body mass index is 35.87 kg/(m^2).  General Appearance: Fairly Groomed  Patent attorneyye Contact::  Fair  Speech:  Clear and Coherent409  Volume:  Normal  Mood:  Euthymic  Affect:  Congruent  Thought Process:  Coherent  Orientation:  Full (Time, Place, and Person)  Thought Content:  WDL  Suicidal Thoughts:  No  Homicidal Thoughts:  No  Memory:  Immediate;   Fair Recent;   Fair Remote;   Fair  Judgement:  Fair  Insight:  Fair  Psychomotor Activity:  Normal  Concentration:  Fair  Recall:  FiservFair  Fund of Knowledge:Fair  Language: Fair  Akathisia:  No  Handed:  Right  AIMS (if indicated):     Assets:  Physical Health Social Support  Sleep:  Number of Hours: 6.5  Cognition: WNL  ADL's:  Intact   Have you used any form of tobacco in the last 30 days? (Cigarettes, Smokeless Tobacco, Cigars, and/or Pipes): Yes  Has this patient used any form of tobacco in the last 30 days? (Cigarettes, Smokeless Tobacco, Cigars, and/or Pipes) Yes, Patient is not ready to quit.  Mental Status Per Nursing Assessment::   On Admission:     Current Mental Status by Physician: Patient was very restless, hyperactive and loud on the unit initially , but pt improved , is calm ,redirectable , denies SI/HI/AH/VH  Loss Factors: NA  Historical Factors: Impulsivity  Risk Reduction Factors:    Living with another person, especially a relative and Positive social support  Continued Clinical Symptoms:  Alcohol/Substance Abuse/Dependencies Previous Psychiatric Diagnoses and Treatments  Cognitive Features That Contribute To Risk:  Polarized thinking    Suicide Risk:  Minimal: No identifiable suicidal ideation.  Patients presenting with no risk factors but with morbid ruminations; may be classified as minimal risk based on the severity of the depressive symptoms  Principal Problem: Schizoaffective disorder, bipolar type Discharge Diagnoses:   Diagnosis:  Primary Psychiatric Diagnosis: Schizoaffective disorder,Bipolar type ,multiple episodes ,currently in acute episode- improved   Secondary Psychiatric Diagnosis: Tobacco use disorder, severe   Non Psychiatric Diagnosis: See pmh  Patient Active Problem List   Diagnosis Date Noted  . Schizoaffective disorder, bipolar type [F25.0] 06/29/2014    Follow-up Information    Follow up with [facility] On 07/08/2014.   Why:  Someone from the ACT team will see you on Friday late morning   Contact information:   [address]  [place]  [phone]      Plan Of Care/Follow-up recommendations:  Activity:  No restrictions Diet:  regular Tests:  as needed Other:  follow up with [facility]T  Is patient on multiple antipsychotic therapies at discharge:  No   Has Patient had three or more failed trials of antipsychotic monotherapy by history:  No  Recommended Plan for Multiple Antipsychotic Therapies: NA    James Brownfield md 07/07/2014, 9:37 AM

## 2014-07-07 NOTE — Progress Notes (Signed)
Patient ID: [name], male   DOB: January 16, 1976, 39 y.o.   MRN: [id number]  Pt currently presents with a flat affect and  behavior. Per self inventory, pt rates depression, hopelessness and anxiety at a 0. [name] daily goal is "going home" and they intend to do so by "go home" Pt reports good sleep, concentration and appetite. Pt seems to be anxious today, pt pacing throughout hallways and exhibiting silly behavior.  Pt provided with medications per providers orders. [name] labs and vitals were monitored throughout the day. Pt supported emotionally and encouraged to express concerns and questions. Pt educated on medications. MD consulted about pt, pt behavior and discharge plans.   [name] safety ensured with 15 minute and environmental checks. Pt currently denies SI/HI and A/V hallucinations. Pt verbally agrees to seek staff if SI/HI or A/VH occurs and to consult with staff before acting on these thoughts. Pt to be discharged today.

## 2014-07-12 ENCOUNTER — Encounter (HOSPITAL_COMMUNITY): Payer: Self-pay | Admitting: *Deleted

## 2014-07-12 ENCOUNTER — Emergency Department (EMERGENCY_DEPARTMENT_HOSPITAL)
Admission: EM | Admit: 2014-07-12 | Discharge: 2014-07-13 | Disposition: A | Discharge status: Other Acute Inpatient Hospital | Payer: Medicare Other | Source: Home / Self Care | Attending: Emergency Medicine | Admitting: Emergency Medicine

## 2014-07-12 DIAGNOSIS — E780 Pure hypercholesterolemia: Secondary | ICD-10-CM

## 2014-07-12 DIAGNOSIS — F319 Bipolar disorder, unspecified: Secondary | ICD-10-CM

## 2014-07-12 DIAGNOSIS — Z720 Tobacco use: Secondary | ICD-10-CM

## 2014-07-12 DIAGNOSIS — F29 Unspecified psychosis not due to a substance or known physiological condition: Secondary | ICD-10-CM | POA: Diagnosis not present

## 2014-07-12 DIAGNOSIS — Z79899 Other long term (current) drug therapy: Secondary | ICD-10-CM | POA: Insufficient documentation

## 2014-07-12 DIAGNOSIS — F17210 Nicotine dependence, cigarettes, uncomplicated: Secondary | ICD-10-CM | POA: Diagnosis not present

## 2014-07-12 DIAGNOSIS — F1310 Sedative, hypnotic or anxiolytic abuse, uncomplicated: Secondary | ICD-10-CM

## 2014-07-12 DIAGNOSIS — F250 Schizoaffective disorder, bipolar type: Secondary | ICD-10-CM | POA: Diagnosis not present

## 2014-07-12 DIAGNOSIS — F419 Anxiety disorder, unspecified: Secondary | ICD-10-CM | POA: Insufficient documentation

## 2014-07-12 DIAGNOSIS — M25551 Pain in right hip: Secondary | ICD-10-CM | POA: Diagnosis not present
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
## 2014-07-12 NOTE — Progress Notes (Addendum)
Patient Discharge Instructions:  After Visit Summary (AVS):   Faxed to:  07/25/14 Discharge Summary Note:   Faxed to:  07/25/14 Psychiatric Admission Assessment Note:   Faxed to:  07/25/14 Suicide Risk Assessment - Discharge Assessment:   Faxed to:  07/25/14 Faxed/Sent to the Next Level Care provider:  07/25/14 [phone] [facility] on 07/12/14.  No ROI was available.  Jerelene ReddenSheena E Pound, 07/12/2014, 2:01 PM

## 2014-07-13 ENCOUNTER — Encounter (HOSPITAL_COMMUNITY): Payer: Self-pay | Admitting: *Deleted

## 2014-07-13 ENCOUNTER — Inpatient Hospital Stay (HOSPITAL_COMMUNITY)
Admission: AD | Admit: 2014-07-13 | Discharge: 2014-07-21 | DRG: 885 | Disposition: A | Discharge status: Home / Self Care | Payer: Medicare Other | Attending: Psychiatry | Admitting: Psychiatry

## 2014-07-13 ENCOUNTER — Other Ambulatory Visit: Payer: Self-pay

## 2014-07-13 DIAGNOSIS — F250 Schizoaffective disorder, bipolar type: Secondary | ICD-10-CM | POA: Diagnosis present

## 2014-07-13 DIAGNOSIS — F17210 Nicotine dependence, cigarettes, uncomplicated: Secondary | ICD-10-CM | POA: Diagnosis present

## 2014-07-13 DIAGNOSIS — Z720 Tobacco use: Secondary | ICD-10-CM | POA: Diagnosis not present

## 2014-07-13 LAB — BASIC METABOLIC PANEL
Anion gap: 7 (ref 5–15)
BUN: 10 mg/dL (ref 6–23)
CHLORIDE: 107 mmol/L (ref 96–112)
CO2: 28 mmol/L (ref 19–32)
CREATININE: 1.11 mg/dL (ref 0.50–1.35)
Calcium: 9.1 mg/dL (ref 8.4–10.5)
GFR calc Af Amer: >90 mL/min (ref >90)
GFR calc non Af Amer: 82 mL/min — ABNORMAL LOW (ref >90)
GLUCOSE: 114 mg/dL — AB (ref 70–99)
Potassium: 3.8 mmol/L (ref 3.5–5.1)
Sodium: 142 mmol/L (ref 135–145)

## 2014-07-13 LAB — CBC
HEMATOCRIT: 43.6 % (ref 39.0–52.0)
Hemoglobin: 14.8 g/dL (ref 13.0–17.0)
MCH: 29.9 pg (ref 26.0–34.0)
MCHC: 33.9 g/dL (ref 30.0–36.0)
MCV: 88.1 fL (ref 78.0–100.0)
PLATELETS: 286 10*3/uL (ref 150–400)
RBC: 4.95 MIL/uL (ref 4.22–5.81)
RDW: 12.5 % (ref 11.5–15.5)
WBC: 10.2 10*3/uL (ref 4.0–10.5)

## 2014-07-13 LAB — RAPID URINE DRUG SCREEN, HOSP PERFORMED
Amphetamines: NOT DETECTED
BARBITURATES: NOT DETECTED
BENZODIAZEPINES: POSITIVE — AB
Cocaine: NOT DETECTED
Opiates: NOT DETECTED
TETRAHYDROCANNABINOL: NOT DETECTED

## 2014-07-13 LAB — ETHANOL

## 2014-07-13 MED ORDER — ZIPRASIDONE MESYLATE 20 MG IM SOLR
20.0000 mg | Freq: Once | INTRAMUSCULAR | Status: AC
  Administered 2014-07-13: 20 mg via INTRAMUSCULAR
  Filled 2014-07-13: qty 20

## 2014-07-13 MED ORDER — ADULT MULTIVITAMIN W/MINERALS CH
1.0000 | ORAL_TABLET | Freq: Every day | ORAL | Status: DC
  Administered 2014-07-13: 1 via ORAL
  Filled 2014-07-13: qty 1

## 2014-07-13 MED ORDER — OLANZAPINE 5 MG PO TBDP
5.0000 mg | ORAL_TABLET | Freq: Three times a day (TID) | ORAL | Status: DC | PRN
  Administered 2014-07-13: 5 mg via ORAL
  Filled 2014-07-13: qty 1

## 2014-07-13 MED ORDER — HYDROXYZINE HCL 25 MG PO TABS: 25.0000 mg | ORAL_TABLET | Freq: Four times a day (QID) | ORAL | Status: DC | PRN

## 2014-07-13 MED ORDER — NICOTINE 21 MG/24HR TD PT24
MEDICATED_PATCH | TRANSDERMAL | Status: AC
  Filled 2014-07-13: qty 1

## 2014-07-13 MED ORDER — LORAZEPAM 1 MG PO TABS
1.0000 mg | ORAL_TABLET | ORAL | Status: AC | PRN
  Administered 2014-07-13: 1 mg via ORAL
  Filled 2014-07-13 (×2): qty 1

## 2014-07-13 MED ORDER — ATORVASTATIN CALCIUM 10 MG PO TABS
20.0000 mg | ORAL_TABLET | Freq: Every day | ORAL | Status: DC
  Administered 2014-07-13: 20 mg via ORAL
  Filled 2014-07-13 (×2): qty 2

## 2014-07-13 MED ORDER — ZIPRASIDONE MESYLATE 20 MG IM SOLR: 20.0000 mg | INTRAMUSCULAR | Status: DC | PRN

## 2014-07-13 MED ORDER — ARIPIPRAZOLE 10 MG PO TABS
10.0000 mg | ORAL_TABLET | Freq: Every day | ORAL | Status: DC
Start: 2014-07-14 — End: 2014-07-14
  Administered 2014-07-14: 10 mg via ORAL
  Filled 2014-07-13 (×3): qty 1

## 2014-07-13 MED ORDER — LORAZEPAM 1 MG PO TABS: 1.0000 mg | ORAL_TABLET | ORAL | Status: DC | PRN

## 2014-07-13 MED ORDER — LAMOTRIGINE 25 MG PO TABS
25.0000 mg | ORAL_TABLET | ORAL | Status: DC
  Administered 2014-07-13 – 2014-07-15 (×4): 25 mg via ORAL
  Filled 2014-07-13 (×8): qty 1

## 2014-07-13 MED ORDER — RISPERIDONE 2 MG PO TBDP
2.0000 mg | ORAL_TABLET | Freq: Three times a day (TID) | ORAL | Status: DC | PRN
  Administered 2014-07-13: 2 mg via ORAL
  Filled 2014-07-13: qty 1

## 2014-07-13 MED ORDER — ARIPIPRAZOLE 10 MG PO TABS
10.0000 mg | ORAL_TABLET | Freq: Every day | ORAL | Status: DC
  Administered 2014-07-13: 10 mg via ORAL
  Filled 2014-07-13 (×2): qty 1

## 2014-07-13 MED ORDER — HYDROXYZINE HCL 25 MG PO TABS
25.0000 mg | ORAL_TABLET | Freq: Four times a day (QID) | ORAL | Status: DC | PRN
  Administered 2014-07-15 – 2014-07-19 (×6): 25 mg via ORAL
  Filled 2014-07-13 (×6): qty 1

## 2014-07-13 MED ORDER — BENZTROPINE MESYLATE 1 MG PO TABS
1.0000 mg | ORAL_TABLET | Freq: Two times a day (BID) | ORAL | Status: DC
  Administered 2014-07-13 (×2): 1 mg via ORAL
  Filled 2014-07-13 (×2): qty 1

## 2014-07-13 MED ORDER — PROPRANOLOL HCL 10 MG PO TABS
10.0000 mg | ORAL_TABLET | Freq: Three times a day (TID) | ORAL | Status: DC
  Administered 2014-07-13 – 2014-07-18 (×13): 10 mg via ORAL
  Filled 2014-07-13 (×19): qty 1

## 2014-07-13 MED ORDER — PROPRANOLOL HCL 10 MG PO TABS
10.0000 mg | ORAL_TABLET | Freq: Three times a day (TID) | ORAL | Status: DC
  Administered 2014-07-13: 10 mg via ORAL
  Filled 2014-07-13: qty 1

## 2014-07-13 MED ORDER — ATORVASTATIN CALCIUM 20 MG PO TABS
20.0000 mg | ORAL_TABLET | Freq: Every day | ORAL | Status: DC
  Administered 2014-07-14 – 2014-07-21 (×8): 20 mg via ORAL
  Filled 2014-07-13 (×2): qty 1
  Filled 2014-07-13: qty 2
  Filled 2014-07-13 (×6): qty 1
  Filled 2014-07-13: qty 2
  Filled 2014-07-13 (×2): qty 1

## 2014-07-13 MED ORDER — NICOTINE 21 MG/24HR TD PT24
21.0000 mg | MEDICATED_PATCH | Freq: Once | TRANSDERMAL | Status: DC
  Administered 2014-07-13: 21 mg via TRANSDERMAL

## 2014-07-13 MED ORDER — STERILE WATER FOR INJECTION IJ SOLN
INTRAMUSCULAR | Status: AC
  Administered 2014-07-13: 05:00:00
  Filled 2014-07-13: qty 10

## 2014-07-13 MED ORDER — ADULT MULTIVITAMIN W/MINERALS CH
1.0000 | ORAL_TABLET | Freq: Every day | ORAL | Status: DC
  Administered 2014-07-14 – 2014-07-21 (×8): 1 via ORAL
  Filled 2014-07-13 (×10): qty 1

## 2014-07-13 MED ORDER — TEMAZEPAM 15 MG PO CAPS
30.0000 mg | ORAL_CAPSULE | Freq: Every day | ORAL | Status: DC
  Administered 2014-07-13 – 2014-07-14 (×2): 30 mg via ORAL
  Filled 2014-07-13 (×2): qty 2

## 2014-07-13 MED ORDER — LAMOTRIGINE 25 MG PO TABS
25.0000 mg | ORAL_TABLET | ORAL | Status: DC
  Administered 2014-07-13: 25 mg via ORAL
  Filled 2014-07-13 (×3): qty 1

## 2014-07-13 MED ORDER — TEMAZEPAM 15 MG PO CAPS
30.0000 mg | ORAL_CAPSULE | Freq: Every day | ORAL | Status: DC
  Administered 2014-07-13: 30 mg via ORAL
  Filled 2014-07-13: qty 2

## 2014-07-13 MED ORDER — BENZTROPINE MESYLATE 1 MG PO TABS
1.0000 mg | ORAL_TABLET | Freq: Two times a day (BID) | ORAL | Status: DC
  Administered 2014-07-13 – 2014-07-15 (×4): 1 mg via ORAL
  Filled 2014-07-13 (×8): qty 1

## 2014-07-13 NOTE — ED Notes (Signed)
Pt awake. telepsych monitor at bedside. telepsych assessment being completed at this time.

## 2014-07-13 NOTE — ED Notes (Signed)
IVC paperwork faxed to [facility] 

## 2014-07-13 NOTE — ED Notes (Signed)
Pt. Given meal 

## 2014-07-13 NOTE — ED Provider Notes (Signed)
5:12 AM Patient is in the hall yelling and cussing at this time. He is requesting that we call the "terminator". We I having difficulty redirecting the patient. He will receive 20 mg IM Geodon at this time  Azalia BilisKevin Moriah Loughry, MD 07/13/14 [phone]

## 2014-07-13 NOTE — ED Notes (Signed)
Pt took shower ,  Back in room now rubbing hair down with vaseline.  Hollaring out quotes from super heroes.
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
## 2014-07-13 NOTE — ED Notes (Signed)
Per Consulting civil engineerCharge RN, [facility] by Dr. Elna BreslowEappen.
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
## 2014-07-13 NOTE — ED Notes (Signed)
[facility] Not able to give report at this time.

## 2014-07-13 NOTE — ED Notes (Signed)
[place] police department called.

## 2014-07-13 NOTE — ED Notes (Signed)
Pt wanting to go to the weight room and work out.  Asked if he could do some push ups.

## 2014-07-13 NOTE — ED Notes (Signed)
Pt asleep. nad noted. Chest rise and fall symmetrical. [employer] officer signed off on at this time.

## 2014-07-13 NOTE — ED Notes (Signed)
[employer] at bedside. [employer] escorted pt out of department with transfer paperwork and pt belongings.

## 2014-07-13 NOTE — Progress Notes (Signed)
Per Tanna SavoyEric, AC at [facility], Pt has been accepted to bed 504-2. Victorino DikeJennifer, RN notified.  CSW also spoke with [facility]ent[facility] [place] who confirmed that Pt was receiving ACTT services through them.  She provided me the number for [name] clinician on the ACT Team, Leta JunglingMarcia [phone].  CSW left a message for Leta JunglingMarcia.   Chad CordialLauren Carter, LCSWA 07/13/2014 12:58 PM

## 2014-07-13 NOTE — ED Notes (Signed)
Pt requesting to take a shower. Security called and escorted pt to shower. nad noted.

## 2014-07-13 NOTE — Tx Team (Signed)
Initial Interdisciplinary Treatment Plan   PATIENT STRESSORS: Marital or family conflict Medication change or noncompliance   PATIENT STRENGTHS: Average or above average intelligence Communication skills Physical Health   PROBLEM LIST: Problem List/Patient Goals Date to be addressed Date deferred Reason deferred Estimated date of resolution  "I need to work on anger management." 07/13/2014     Psychosis 07/13/2014     Noncompliant w/medications 07/13/2014                                          DISCHARGE CRITERIA:  Improved stabilization in mood, thinking, and/or behavior Reduction of life-threatening or endangering symptoms to within safe limits Verbal commitment to aftercare and medication compliance  PRELIMINARY DISCHARGE PLAN: Outpatient therapy Placement in alternative living arrangements  PATIENT/FAMIILY INVOLVEMENT: This treatment plan has been presented to and reviewed with the patient, [name].  The patient and family have been given the opportunity to ask questions and make suggestions.  Cranford MonBeaudry, Dilon Lank Evans 07/13/2014, 6:27 PM
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
## 2014-07-13 NOTE — ED Notes (Signed)
Valuables locked up with security. 1 bag of belongings locked in ED lockers.

## 2014-07-13 NOTE — ED Notes (Signed)
Security at bedside verifying pt belongings that was in safe. Pt belonging that were in lockers are at nurses station awaiting [facility].
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
## 2014-07-13 NOTE — ED Notes (Signed)
Attempted report x2. Phone kept ringing once transferred for report.

## 2014-07-13 NOTE — ED Provider Notes (Signed)
  Physical Exam  BP 113/64 mmHg  Pulse 76  Temp(Src) 97.8 F (36.6 C) (Oral)  Resp 20  Ht 6\' 2"  (1.88 m)  Wt 235 lb (106.595 kg)  BMI 30.16 kg/m2  SpO2 98%  Physical Exam  ED Course  Procedures  MDM Patient become somewhat more agitated. Will start agitation protocol.      Benjiman CoreNathan Jovita Persing, MD 07/13/14 [id number]
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
## 2014-07-13 NOTE — ED Notes (Signed)
[employer] called by unit secretary to pick up pt and [facility].
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
## 2014-07-13 NOTE — ED Notes (Addendum)
[facility] with sarah at [facility].   States she may come see him later on.
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
## 2014-07-14 ENCOUNTER — Encounter (HOSPITAL_COMMUNITY): Payer: Self-pay | Admitting: Psychiatry

## 2014-07-14 MED ORDER — DIPHENHYDRAMINE HCL 25 MG PO CAPS
50.0000 mg | ORAL_CAPSULE | Freq: Three times a day (TID) | ORAL | Status: DC | PRN
  Administered 2014-07-14 – 2014-07-18 (×9): 50 mg via ORAL
  Filled 2014-07-14 (×9): qty 2

## 2014-07-14 MED ORDER — OLANZAPINE 10 MG PO TBDP
10.0000 mg | ORAL_TABLET | Freq: Three times a day (TID) | ORAL | Status: DC | PRN
  Administered 2014-07-14 – 2014-07-18 (×11): 10 mg via ORAL
  Filled 2014-07-14 (×11): qty 1

## 2014-07-14 MED ORDER — DIPHENHYDRAMINE HCL 50 MG/ML IJ SOLN
50.0000 mg | Freq: Three times a day (TID) | INTRAMUSCULAR | Status: DC | PRN
  Administered 2014-07-16: 50 mg via INTRAMUSCULAR
  Filled 2014-07-14 (×2): qty 1

## 2014-07-14 MED ORDER — BIOTENE DRY MOUTH MT LIQD
15.0000 mL | OROMUCOSAL | Status: DC | PRN
  Administered 2014-07-14 – 2014-07-21 (×15): 15 mL via OROMUCOSAL
  Filled 2014-07-14 (×2): qty 237

## 2014-07-14 MED ORDER — NICOTINE 21 MG/24HR TD PT24
21.0000 mg | MEDICATED_PATCH | Freq: Every day | TRANSDERMAL | Status: DC
  Administered 2014-07-14 – 2014-07-21 (×8): 21 mg via TRANSDERMAL
  Filled 2014-07-14 (×5): qty 1
  Filled 2014-07-14: qty 14
  Filled 2014-07-14 (×4): qty 1

## 2014-07-14 MED ORDER — OLANZAPINE 10 MG IM SOLR
10.0000 mg | Freq: Three times a day (TID) | INTRAMUSCULAR | Status: DC | PRN
  Administered 2014-07-16 – 2014-07-18 (×2): 10 mg via INTRAMUSCULAR
  Filled 2014-07-14 (×2): qty 10

## 2014-07-14 MED ORDER — HALOPERIDOL 5 MG PO TABS
5.0000 mg | ORAL_TABLET | Freq: Two times a day (BID) | ORAL | Status: DC
  Administered 2014-07-14: 5 mg via ORAL
  Filled 2014-07-14 (×6): qty 1

## 2014-07-14 NOTE — [facility] Suicide Risk Assessment (Signed)
[facility]   Nursing information obtained from:    Demographic factors:    Current Mental Status:    Loss Factors:    Historical Factors:    Risk Reduction Factors:    Total Time spent with patient: 30 minutes Principal Problem: Schizoaffective disorder, bipolar type Diagnosis:   Patient Active Problem List   Diagnosis Date Noted  . Tobacco use disorder [Z72.0]   . Schizoaffective disorder, bipolar type [F25.0] 06/29/2014     Continued Clinical Symptoms:  Alcohol Use Disorder Identification Test Final Score (AUDIT): 0 The "Alcohol Use Disorders Identification Test", Guidelines for Use in Primary Care, Second Edition.  World Science writerHealth Organization [facility](WHO). Score between 0-7:  no or low risk or alcohol related problems. Score between 8-15:  moderate risk of alcohol related problems. Score between 16-19:  high risk of alcohol related problems. Score 20 or above:  warrants further diagnostic evaluation for alcohol dependence and treatment.   CLINICAL FACTORS:   Unstable or Poor Therapeutic Relationship Previous Psychiatric Diagnoses and Treatments   Musculoskeletal: Strength & Muscle Tone: within normal limits Gait & Station: normal Patient leans: N/A  Psychiatric Specialty Exam: Physical Exam  Review of Systems  Psychiatric/Behavioral: Negative for depression. The patient is nervous/anxious and has insomnia.     Blood pressure 143/103, pulse 99, temperature 97.8 F (36.6 C), temperature source Oral, resp. rate 18, height 6\' 2"  (1.88 m), weight 106.595 kg (235 lb).Body mass index is 30.16 kg/(m^2).                        Please see H&P.                                  COGNITIVE FEATURES THAT CONTRIBUTE TO RISK:  Closed-mindedness, Polarized thinking and Thought constriction (tunnel vision)    SUICIDE RISK:   Mild:  Suicidal ideation of limited frequency, intensity, duration, and specificity.  There are no identifiable  plans, no associated intent, mild dysphoria and related symptoms, good self-control (both objective and subjective assessment), few other risk factors, and identifiable protective factors, including available and accessible social support.  PLAN OF CARE: Please see H&P.   Medical Decision Making:  Review of Psycho-Social Stressors (1), Review or order clinical lab tests (1), Decision to obtain old records (1), Established Problem, Worsening (2), Review of Last Therapy Session (1), Review or order medicine tests (1), Review of Medication Regimen & Side Effects (2) and Review of New Medication or Change in Dosage (2)  I certify that inpatient services furnished can reasonably be expected to improve the patient's condition.   Xianna Siverling MD 07/14/2014, 11:59 AM

## 2014-07-14 NOTE — [facility] Group Notes (Signed)
[facility] Group Notes:  (Counselor/Nursing/MHT/Case Management/Adjunct)  07/14/2014 1:15PM  Type of Therapy:  Group Therapy  Participation Level:  Active  Participation Quality:  Appropriate  Affect:  Flat  Cognitive:  Oriented  Insight:  Improving  Engagement in Group:  Limited  Engagement in Therapy:  Limited  Modes of Intervention:  Discussion, Exploration and Socialization  Summary of Progress/Problems: The topic for group was balance in life.  Pt participated in the discussion about when their life was in balance and out of balance and how this feels.  Pt discussed ways to get back in balance and short term goals they can work on to get where they want to be. [name] was allowed to stay in group because he agreed to raise his hand before he spoke.  He presents as pressured, disorganized, euphoric.   [name], [name] 07/14/2014 2:18 PM
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
## 2014-07-14 NOTE — Progress Notes (Signed)
D:  Patient's self inventory sheet, patient had good night's sleep, no medication given for sleep.  Good appetite, normal energy level, good concentration.  Denied depression, hopeless, anxiety.  Denied withdrawals.   Denied SI.  Denied physical problems.  Patient does have discharge plans. A:  Medications administered per MD orders.  Emotional support and encouragement given patient. R:  Denied SI and HI.  Denied A/V hallucinations.  Safety maintained with 15 minute checks.

## 2014-07-14 NOTE — Progress Notes (Signed)
Patient ID: [name], male   DOB: 05/02/1975, 38 y.o.   MRN: [id number] PER STATE REGULATIONS 482.30  THIS CHART WAS REVIEWED FOR MEDICAL NECESSITY WITH RESPECT TO THE PATIENT'S ADMISSION/DURATION OF STAY.  NEXT REVIEW DATE: 07/17/14  Deltha Bernales, RN, BSN CASE MANAGER   

## 2014-07-14 NOTE — BHH Group Notes (Signed)
The focus of this group is to educate the patient on the purpose and policies of crisis stabilization and provide a format to answer questions about their admission.  The group details unit policies and expectations of patients while admitted.  Patient attended 0900 nurse education orientation this morning.  Patient actively participated, appropriate affect, alert, appropriate insight and engagement.  Today patient will work on 3 goals for discharge.  

## 2014-07-14 NOTE — Progress Notes (Signed)
Patient ID: James GreenBrian T XXXGunter, male   DOB: 02/21/1976, 39 y.o.   MRN: [id number]  Writer assumed care of this patient at [id number] [facility] working with this patient as well. [name] is seen in the hallway speaking loudly and acting somewhat inappropriate with male staff. Patient can be redirected and is cooperative though. Writer has noted patient not wanting to take Haldol due to patient reports of its effect on his sex life. Patient is interacting with peers but does not get along well with another patient on the hall. Q15 minute safety checks are maintained.
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
## 2014-07-14 NOTE — Progress Notes (Signed)
D. Pt had been up and visible in milieu this evening, pt had been agitated and pacing the hallways for a good portion of the night. Pt has appeared paranoid and spoke about how people are wanting him to kill other people, pt has been loud and has appeared to be unable to handle a roommate this evening. Pt thought process has appeared to be disorganized with flight of ideas and easily agitated at what he perceives others are saying about him. A. Support and encouragement provided, pt did respond appropriately when staff approached with a soft spoken voice. R. Safety maintained, will continue to monitor.

## 2014-07-14 NOTE — BHH Group Notes (Signed)
Adult Psychoeducational Group Note  Date:  07/14/2014 Time:  1:53 AM  Group Topic/Focus:  Wrap-Up Group:   The focus of this group is to help patients review their daily goal of treatment and discuss progress on daily workbooks.  Participation Level:  Did Not Attend  Participation Quality:  None  Affect:  None  Cognitive:  None  Insight: None  Engagement in Group:  None  Modes of Intervention:  Discussion  Additional Comments:  [name] did not attend group.  [name], [name] 07/14/2014, 1:53 AM
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
## 2014-07-14 NOTE — Plan of Care (Signed)
Problem: Consults Goal: Suicide Risk Patient Education (See Patient Education module for education specifics)  Outcome: Completed/Met Date Met:  07/14/14 Nurse discussed SI thoughts with patient.

## 2014-07-14 NOTE — Progress Notes (Signed)
Patient ID: Jodelle GreenBrian T XXXGunter, male   DOB: [birth year], 39 y.o.   MRN: [id number] PER STATE REGULATIONS 482.30  THIS CHART WAS REVIEWED FOR MEDICAL NECESSITY WITH RESPECT TO THE PATIENT'S ADMISSION/DURATION OF STAY.  NEXT REVIEW DATE: 07/17/14  Loura HaltBARBARA Vinetta Brach, RN, BSN CASE MANAGER
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
## 2014-07-15 MED ORDER — FLUPHENAZINE HCL 5 MG PO TABS
5.0000 mg | ORAL_TABLET | ORAL | Status: DC
  Administered 2014-07-15 – 2014-07-16 (×3): 5 mg via ORAL
  Filled 2014-07-15 (×8): qty 1

## 2014-07-15 MED ORDER — DOXEPIN HCL 10 MG PO CAPS
10.0000 mg | ORAL_CAPSULE | Freq: Every day | ORAL | Status: DC
  Administered 2014-07-15: 10 mg via ORAL
  Filled 2014-07-15 (×3): qty 1

## 2014-07-15 MED ORDER — FLUPHENAZINE HCL 5 MG PO TABS: 5.0000 mg | ORAL_TABLET | ORAL | Status: DC

## 2014-07-15 MED ORDER — LAMOTRIGINE 25 MG PO TABS
50.0000 mg | ORAL_TABLET | Freq: Every day | ORAL | Status: DC
  Administered 2014-07-15 – 2014-07-18 (×4): 50 mg via ORAL
  Filled 2014-07-15 (×6): qty 2

## 2014-07-15 MED ORDER — IBUPROFEN 400 MG PO TABS
400.0000 mg | ORAL_TABLET | ORAL | Status: DC | PRN
  Administered 2014-07-15 – 2014-07-21 (×11): 400 mg via ORAL
  Filled 2014-07-15 (×11): qty 1

## 2014-07-15 MED ORDER — LAMOTRIGINE 25 MG PO TABS
25.0000 mg | ORAL_TABLET | ORAL | Status: DC
  Administered 2014-07-16 – 2014-07-21 (×6): 25 mg via ORAL
  Filled 2014-07-15 (×6): qty 1
  Filled 2014-07-15: qty 3
  Filled 2014-07-15: qty 1

## 2014-07-15 MED ORDER — BENZTROPINE MESYLATE 1 MG PO TABS
1.0000 mg | ORAL_TABLET | ORAL | Status: DC
  Administered 2014-07-15 – 2014-07-21 (×12): 1 mg via ORAL
  Filled 2014-07-15 (×5): qty 1
  Filled 2014-07-15: qty 6
  Filled 2014-07-15 (×9): qty 1
  Filled 2014-07-15: qty 6
  Filled 2014-07-15: qty 1

## 2014-07-15 NOTE — [facility] Group Notes (Signed)
[facility] LCSW Group Therapy  07/15/2014 1:25 PM  Type of Therapy:  Group Therapy  Participation Level:  Minimal  Participation Quality:  Attentive  Affect:  Flat  Cognitive:  Alert  Insight:  Limited  Engagement in Therapy:  Limited  Modes of Intervention:  Discussion, Socialization and Support  Summary of Progress/Problems:Feelings around Relapse. Group members discussed the meaning of relapse and shared personal stories of relapse, how it affected them and others, and how they perceived themselves during this time. Group members were encouraged to identify triggers, warning signs and coping skills used when facing the possibility of relapse. Social supports were discussed and explored in detail. Arlys JohnBrian attended group. He sat quietly and listened to other members of the group.    Hyatt,Candace 07/15/2014, 1:25 PM

## 2014-07-15 NOTE — Progress Notes (Addendum)
[name] Progress Note  07/15/2014 2:02 PM [name]  MRN:  [id number] Subjective:Patient states "I do not want to take Haldol, It makes my penis go numb , I want to call my lawyers.'  Objective: Patient seen and chart reviewed.Patient discussed with treatment team. Patient with hx of schizoaffective disorder ,presented again after being discharged from here a week ago. Per mother pt did not do well after DC, had continued anger management issues , was noncompliant with medications and was not sleeping well at night. Patient continues to be hyperactive , has sleep issues. Pt was taken off of the abilify on which he was discharged last time , his mother reported he did well on haldol in the past. This was discussed with patient yesterday and he was started on Haldol. Pt took one dose and refused to take any more . Pt states that 'it makes his teeth rotten and his penis numb.' Discussed other options with patient. Pt agreeable to take Prolixin. Pt to be started on Prolixin today. Pt per staff has not been sleeping at all . Will consider another sleep medications tonight. Will discontinue Restoril.      Principal Problem: Schizoaffective disorder, bipolar type Diagnosis:   Primary Psychiatric Diagnosis: Schizoaffective disorder,Bipolar type ,multiple episodes ,currently in acute episode   Secondary Psychiatric Diagnosis: Tobacco use disorder, severe   Non Psychiatric Diagnosis: See pmh Patient Active Problem List   Diagnosis Date Noted  . Tobacco use disorder [Z72.0]   . Schizoaffective disorder, bipolar type [F25.0] 06/29/2014   Total Time spent with patient: 30 minutes        Past Medical History:  Past Medical History  Diagnosis Date  . Paranoid schizophrenia   . Hyperlipidemia   . Schizophrenia   . Bipolar disorder     Past Surgical History  Procedure Laterality Date  . Testicle surgery     Family History:  Family History  Problem Relation Age of Onset  .  Schizophrenia Neg Hx    Social History:  History  Alcohol Use No     History  Drug Use No    History   Social History  . Marital Status: Single    Spouse Name: N/A  . Number of Children: N/A  . Years of Education: N/A   Social History Main Topics  . Smoking status: Current Every Day Smoker -- 1.00 packs/day    Types: Cigarettes  . Smokeless tobacco: Not on file  . Alcohol Use: No  . Drug Use: No  . Sexual Activity: Yes    Birth Control/ Protection: Condom   Other Topics Concern  . None   Social History Narrative   Additional History:    Sleep: Poor  Appetite:  Fair     Musculoskeletal: Strength & Muscle Tone: within normal limits Gait & Station: normal Patient leans: N/A   Psychiatric Specialty Exam: Physical Exam  Review of Systems  Psychiatric/Behavioral: The patient has insomnia.     Blood pressure 136/76, pulse 91, temperature 97.6 F (36.4 C), temperature source Oral, resp. rate 18, height  (1.88 m), weight 106.595 kg (235 lb).Body mass index is 30.16 kg/(m^2).  General Appearance: Fairly Groomed  Patent attorney::  Good  Speech:  Normal Rate   Volume:  Normal   Mood:  Anxious and Irritable   Affect:  Labile  Thought Process:  more organized  Orientation:  Full (Time, Place, and Person)  Thought Content:  Delusions and Paranoid Ideation  Suicidal Thoughts:  No  Homicidal Thoughts:  No  Memory:  Immediate;   Fair Recent;   Fair Remote;   Fair  Judgement:  Impaired  Insight:  Lacking  Psychomotor Activity:  Restlessness   Concentration:  Fair  Recall:  [employer] of Knowledge:Fair  Language: Fair  Akathisia:  No  Handed:  Right  AIMS (if indicated):   0  Assets:  Communication Skills Desire for Improvement  ADL's:  Intact  Cognition: WNL  Sleep:        Current Medications: Current Facility-Administered Medications  Medication Dose Route Frequency Provider Last Rate Last Dose  . antiseptic oral rinse (BIOTENE) solution 15 mL   15 mL Mouth Rinse PRN Lafaye Mcelmurry, [name]   15 mL at 07/14/14 1710  . atorvastatin (LIPITOR) tablet 20 mg  20 mg Oral Daily Earney Navy, NP   20 mg at 07/15/14 0828  . benztropine (COGENTIN) tablet 1 mg  1 mg Oral BH-qamhs Michaell Grider, [name]   1 mg at 07/15/14 1103  . OLANZapine zydis (ZYPREXA) disintegrating tablet 10 mg  10 mg Oral TID PRN Jomarie Longs, [name]   10 mg at 07/15/14 0039   And  . diphenhydrAMINE (BENADRYL) capsule 50 mg  50 mg Oral TID PRN Jomarie Longs, [name]   50 mg at 07/15/14 0039  . OLANZapine (ZYPREXA) injection 10 mg  10 mg Intramuscular TID PRN Jomarie Longs, [name]       And  . diphenhydrAMINE (BENADRYL) injection 50 mg  50 mg Intramuscular TID PRN Jomarie Longs, [name]      . doxepin (SINEQUAN) capsule 10 mg  10 mg Oral QHS Troyce Gieske, [name]      . fluPHENAZine (PROLIXIN) tablet 5 mg  5 mg Oral BH-qamhs Hollynn Garno, [name]   5 mg at 07/15/14 1102  . hydrOXYzine (ATARAX/VISTARIL) tablet 25 mg  25 mg Oral Q6H PRN Earney Navy, NP   25 mg at 07/15/14 0830  . ibuprofen (ADVIL,MOTRIN) tablet 400 mg  400 mg Oral Q4H PRN Adonis Brook, NP   400 mg at 07/15/14 1210  . [START ON 07/16/2014] lamoTRIgine (LAMICTAL) tablet 25 mg  25 mg Oral BH-q7a Brandy Zuba, [name]      . lamoTRIgine (LAMICTAL) tablet 50 mg  50 mg Oral QHS Makari Sanko, [name]      . multivitamin with minerals tablet 1 tablet  1 tablet Oral Daily Earney Navy, NP   1 tablet at 07/15/14 0828  . nicotine (NICODERM CQ - dosed in mg/24 hours) patch 21 mg  21 mg Transdermal Daily Jomarie Longs, [name]   21 mg at 07/15/14 0832  . propranolol (INDERAL) tablet 10 mg  10 mg Oral TID Earney Navy, NP   10 mg at 07/15/14 1209    Lab Results:  No results found for this or any previous visit (from the past 48 hour(s)).  Physical Findings: AIMS: Facial and Oral Movements Muscles of Facial Expression: None, normal Lips and Perioral Area: None, normal Jaw: None, normal Tongue: None, normal,Extremity  Movements Upper (arms, wrists, hands, fingers): None, normal Lower (legs, knees, ankles, toes): None, normal, Trunk Movements Neck, shoulders, hips: None, normal, Overall Severity Severity of abnormal movements (highest score from questions above): None, normal Incapacitation due to abnormal movements: None, normal Patient's awareness of abnormal movements (rate only patient's report): No Awareness, Dental Status Current problems with teeth and/or dentures?: No Does patient usually wear dentures?: No  CIWA:  CIWA-Ar Total: 1 COWS:  COWS Total Score: 2  Assessment:  Patient presented delusional as well as disorganized. Patient continues to have sleep issues , will continue medication readjustment.  Treatment Plan Summary: Daily contact with patient to assess and evaluate symptoms and progress in treatment and Medication management Will DC Haldol, patient refusing to take it. Will start Prolixin 5 mg po bid.Plan to DC on LAI. Will increase Lamictal to 75 mg po daily. Will DC restoril , start Doxepin 10 mg po qhs for sleep. ( pt was tried on Trazodone , ambien, restoril with no effect.) . Will make available prn medications for agitation/anxiety. Will continue to monitor vitals ,medication compliance and treatment side effects while patient is here.  Reviewed labs ,will order as needed.  CSW will start working on disposition.  Patient to participate in therapeutic milieu .     Medical Decision Making:  Review of Psycho-Social Stressors (1), Review or order clinical lab tests (1), Order AIMS Test (2), New Problem, with no additional work-up planned (3), Review of Last Therapy Session (1), Review of Medication Regimen & Side Effects (2) and Review of New Medication or Change in Dosage (2)     Leida Luton [name] 07/15/2014, 2:02 PM

## 2014-07-15 NOTE — [facility] Group Notes (Signed)
[facility]   07/15/2014 10:56 AM  Participation Quality:  Active   Mood/Affect:  Labile  Depression Rating:  0  Anxiety Rating:  0  Thoughts of Suicide:  No Will you contract for safety?   NA  Current AVH:  No  Plan for Discharge/Comments:  Arlys JohnBrian states he slept last night. However, according to nurse he did not sleep much. He continues to be intrusive and lacks insight. When discharged, he will be returning home and [facility]   Transportation Means: Mother   Supports:Family, ACT team.   Hyatt,Candace

## 2014-07-15 NOTE — Progress Notes (Signed)
D.  Pt pacing and loud in hall, continuously rapping and inappropriately laughing.  Pt did go to wrap up group, remained loud, somewhat redirectable.  Denies SI/HI/hallucinations at this time.  Pt did take all scheduled medications this evening, as well as some zyprexa for agitation.  Pt remains in hall and does not wish to go to his room.  As was the case the last time he was a patient here Pt does not sleep at night, paces the hall loudly rapping.  A.  Support and encouragement offered, medication given as ordered  Encouraged Benadryl also for sleep but Pt presently denies need.  R.  Pt remains safe on unit, pacing hall, rapping loudly.

## 2014-07-15 NOTE — Progress Notes (Signed)
D: Patient is alert and oriented. [name] mood and affect is anxious. [name] speech is pressured, loud, and tangential. Pt denies SI/HI and AVH. Pt rates his depression, hopelessness, 0/10. Pt paces the halls at times. Pt complains of anxiety this morning with relief from PRN medication. Pt is requesting Ibuprofen order for knee pain, 5/10. Pt refused 0800 dose of haldol this morning d/t "personal concerns," pt refuses to elaborate on details to RN. Pt is attending groups. Pt complains of anxiety this evening. Pt needs redirection frequently.  A: MD, Eappen made aware of pt's concerns. Active listening by RN. PRN medication administered for pain and anxiety per providers orders (See MAR). Scheduled medications administered per providers orders (See MAR). 15 minute checks continued per protocol for patient safety.  R: Patient cooperative and receptive to nursing interventions. Pt remains safe. Pt receptive to redirection.

## 2014-07-15 NOTE — Progress Notes (Signed)
Pt has been up throughout much of the early morning hours, at times irritable and paranoid and pacing hallway. Pt slept in 30 minute increments totaling approximately 2 hours. Pt has needed PRN medication to help with paranoia and agitation

## 2014-07-16 DIAGNOSIS — Z720 Tobacco use: Secondary | ICD-10-CM

## 2014-07-16 DIAGNOSIS — F250 Schizoaffective disorder, bipolar type: Principal | ICD-10-CM

## 2014-07-16 MED ORDER — FLUPHENAZINE HCL 5 MG PO TABS
5.0000 mg | ORAL_TABLET | Freq: Once | ORAL | Status: AC
  Administered 2014-07-16: 5 mg via ORAL

## 2014-07-16 MED ORDER — FLUPHENAZINE HCL 10 MG PO TABS
10.0000 mg | ORAL_TABLET | ORAL | Status: DC
  Administered 2014-07-16 – 2014-07-18 (×4): 10 mg via ORAL
  Filled 2014-07-16: qty 2
  Filled 2014-07-16: qty 1
  Filled 2014-07-16: qty 2
  Filled 2014-07-16 (×5): qty 1

## 2014-07-16 MED ORDER — DOXEPIN HCL 25 MG PO CAPS
25.0000 mg | ORAL_CAPSULE | Freq: Every day | ORAL | Status: DC
  Administered 2014-07-16: 25 mg via ORAL
  Filled 2014-07-16 (×3): qty 1

## 2014-07-16 NOTE — Progress Notes (Signed)
D.  Pt pleasant on approach, continues to laugh inappropriately at times and rap loudly.  Pt discussing lines from movies and seeing if staff can determine which film the line is from.  Pt positive for evening wrap up group with appropriate participation.  Denies SI/HI/hallucinations at this time.  Hopeful to sleep well tonight, as he did last night with zyprexa and Benadryl.  A.  Support and encouragement offered.  Medication given as ordered for agitation.  R.  Pt remains safe on unit, will continue to monitor.
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
## 2014-07-16 NOTE — Progress Notes (Signed)
[facility]  07/16/2014 1:09 PM [name]  MRN:  [id number] Subjective:Patient states "someone stole my underware". Pt reports he slept well last night but notes indicate pt did not. Energy is increased. Denies depression and states mood is good. Denies SI/HI, AVH. States Prolixin is better than Haldol and is agreeable to dose increase. Denies SE.   Objective: Patient seen and chart reviewed. Patient with hx of schizoaffective disorder, presented again after being discharged from here a week ago. Per mother pt did not do well after DC, had continued anger management issues, was noncompliant with medications and was not sleeping well at night. Patient continues to be hyperactive, manic, has sleep issues. He is observed pacing in halls, making threatening statements, rapping in the hallways.  Pt agreeable to take Prolixin.  Pt per staff has not been sleeping at night.       Principal Problem: Schizoaffective disorder, bipolar type Diagnosis:   Primary Psychiatric Diagnosis: Schizoaffective disorder,Bipolar type ,multiple episodes ,currently in acute episode   Secondary Psychiatric Diagnosis: Tobacco use disorder, severe   Non Psychiatric Diagnosis: See pmh Patient Active Problem List   Diagnosis Date Noted  . Tobacco use disorder [Z72.0]   . Schizoaffective disorder, bipolar type [F25.0] 06/29/2014   Total Time spent with patient: 30 minutes        Past Medical History:  Past Medical History  Diagnosis Date  . Paranoid schizophrenia   . Hyperlipidemia   . Schizophrenia   . Bipolar disorder     Past Surgical History  Procedure Laterality Date  . Testicle surgery     Family History:  Family History  Problem Relation Age of Onset  . Schizophrenia Neg Hx    Social History:  History  Alcohol Use No     History  Drug Use No    History   Social History  . Marital Status: Single    Spouse Name: N/A  . Number of Children: N/A  . Years of  Education: N/A   Social History Main Topics  . Smoking status: Current Every Day Smoker -- 1.00 packs/day    Types: Cigarettes  . Smokeless tobacco: Not on file  . Alcohol Use: No  . Drug Use: No  . Sexual Activity: Yes    Birth Control/ Protection: Condom   Other Topics Concern  . None   Social History Narrative   Additional History:    Sleep: Poor  Appetite:  Fair     Musculoskeletal: Strength & Muscle Tone: within normal limits Gait & Station: normal Patient leans: N/A   Psychiatric Specialty Exam: Physical Exam   Review of Systems  Musculoskeletal: Negative for back pain, joint pain and neck pain.  Psychiatric/Behavioral: Negative for depression, suicidal ideas, hallucinations and substance abuse. The patient is not nervous/anxious and does not have insomnia.     Blood pressure 106/68, pulse 87, temperature 97.2 F (36.2 C), temperature source Oral, resp. rate 16, height 6\' 2"  (1.88 m), weight 106.595 kg (235 lb).Body mass index is 30.16 kg/(m^2).  General Appearance: Fairly Groomed  Patent attorneyye Contact::  Good  Speech:  Normal Rate   Volume:  Normal   Mood:  Irritable   Affect:  Labile  Thought Process:  more organized  Orientation:  Full (Time, Place, and Person)  Thought Content:  Delusions and Paranoid Ideation  Suicidal Thoughts:  No  Homicidal Thoughts:  No  Memory:  Immediate;   Fair Recent;   Fair Remote;   Fair  Judgement:  Impaired  Insight:  Lacking  Psychomotor Activity:  Increased and Restlessness   Concentration:  Fair  Recall:  [employer] of Knowledge:Fair  Language: Fair  Akathisia:  No  Handed:  Right  AIMS (if indicated):   0  Assets:  Communication Skills Desire for Improvement  ADL's:  Intact  Cognition: WNL  Sleep:        Current Medications: Current Facility-Administered Medications  Medication Dose Route Frequency Provider Last Rate Last Dose  . antiseptic oral rinse (BIOTENE) solution 15 mL  15 mL Mouth Rinse PRN Jomarie Longs, MD   15 mL at 07/15/14 1908  . atorvastatin (LIPITOR) tablet 20 mg  20 mg Oral Daily Earney Navy, NP   20 mg at 07/16/14 0838  . benztropine (COGENTIN) tablet 1 mg  1 mg Oral BH-qamhs Jomarie Longs, MD   1 mg at 07/16/14 1610  . OLANZapine zydis (ZYPREXA) disintegrating tablet 10 mg  10 mg Oral TID PRN Jomarie Longs, MD   10 mg at 07/16/14 1214   And  . diphenhydrAMINE (BENADRYL) capsule 50 mg  50 mg Oral TID PRN Jomarie Longs, MD   50 mg at 07/16/14 1214  . OLANZapine (ZYPREXA) injection 10 mg  10 mg Intramuscular TID PRN Jomarie Longs, MD       And  . diphenhydrAMINE (BENADRYL) injection 50 mg  50 mg Intramuscular TID PRN Jomarie Longs, MD      . doxepin (SINEQUAN) capsule 10 mg  10 mg Oral QHS Saramma Eappen, MD   10 mg at 07/15/14 2200  . fluPHENAZine (PROLIXIN) tablet 5 mg  5 mg Oral BH-qamhs Saramma Eappen, MD   5 mg at 07/16/14 0839  . hydrOXYzine (ATARAX/VISTARIL) tablet 25 mg  25 mg Oral Q6H PRN Earney Navy, NP   25 mg at 07/16/14 0839  . ibuprofen (ADVIL,MOTRIN) tablet 400 mg  400 mg Oral Q4H PRN Adonis Brook, NP   400 mg at 07/16/14 0916  . lamoTRIgine (LAMICTAL) tablet 25 mg  25 mg Oral Lucianne Lei, MD   25 mg at 07/16/14 0548  . lamoTRIgine (LAMICTAL) tablet 50 mg  50 mg Oral QHS Jomarie Longs, MD   50 mg at 07/15/14 2200  . multivitamin with minerals tablet 1 tablet  1 tablet Oral Daily Earney Navy, NP   1 tablet at 07/16/14 [phone]  . nicotine (NICODERM CQ - dosed in mg/24 hours) patch 21 mg  21 mg Transdermal Daily Jomarie Longs, MD   21 mg at 07/16/14 0838  . propranolol (INDERAL) tablet 10 mg  10 mg Oral TID Earney Navy, NP   10 mg at 07/16/14 1213    Lab Results:  No results found for this or any previous visit (from the past 48 hour(s)).  Physical Findings: AIMS: Facial and Oral Movements Muscles of Facial Expression: None, normal Lips and Perioral Area: None, normal Jaw: None, normal Tongue: None, normal,Extremity  Movements Upper (arms, wrists, hands, fingers): None, normal Lower (legs, knees, ankles, toes): None, normal, Trunk Movements Neck, shoulders, hips: None, normal, Overall Severity Severity of abnormal movements (highest score from questions above): None, normal Incapacitation due to abnormal movements: None, normal Patient's awareness of abnormal movements (rate only patient's report): No Awareness, Dental Status Current problems with teeth and/or dentures?: No Does patient usually wear dentures?: No  CIWA:  CIWA-Ar Total: 1 COWS:  COWS Total Score: 2  Assessment: Patient presented delusional as well as disorganized. Patient continues to have sleep issues ,  will continue medication readjustment.  Treatment Plan Summary: Daily contact with patient to assess and evaluate symptoms and progress in treatment and Medication management Increase Prolixin to  po bid.Plan to DC on LAI.  Lamictal to 75 mg po daily. Increase to Doxepin  po qhs for sleep. ( pt was tried on Trazodone , ambien, restoril with no effect.) . Will make available prn medications for agitation/anxiety. Will continue to monitor vitals ,medication compliance and treatment side effects while patient is here.  Reviewed labs ,will order as needed.  CSW will start working on disposition.  Patient to participate in therapeutic milieu .     Medical Decision Making:  Review of Psycho-Social Stressors (1), Established Problem, Worsening (2), Review of Medication Regimen & Side Effects (2) and Review of New Medication or Change in Dosage (2)     [name], James Dunleavy MD 07/16/2014, 1:09 PM

## 2014-07-16 NOTE — [facility] Group Notes (Signed)
[facility] Group Notes:  (Clinical Social Work)  07/16/2014  11:15-12:00PM  Summary of Progress/Problems:   The main focus of today's process group was to discuss patients' feelings about hospitalization, the stigma attached to mental health, and sources of motivation to stay well.  We then worked to identify a specific plan to avoid future hospitalizations when discharged from the hospital for this admission.  The patient expressed that he is having an excellent day, and was tangential and delusional during the part of group in which he participated prior to leaving.  Type of Therapy:  Group Therapy - Process  Participation Level:  Active  Participation Quality:  Attentive, Monopolizing and Sharing  Affect:  Excited  Cognitive:  Delusional  Insight:  Limited  Engagement in Therapy:  Developing/Improving  Modes of Intervention:  Exploration, Discussion  Ambrose MantleMareida Grossman-Orr, LCSW 07/16/2014, 1:01 PM

## 2014-07-16 NOTE — Progress Notes (Signed)
The focus of this group is to help patients review their daily goal of treatment and discuss progress on daily workbooks. Pt attended the evening group session but quickly fell asleep and remained that way for most of it. When awoken to respond to discussion prompts, Pt apologized for falling asleep and said he was feeling very sleepy. James JohnBrian told the group he had a good day, though he hoped to go home this coming week. [name] affect was appropriate.

## 2014-07-17 MED ORDER — QUETIAPINE FUMARATE 100 MG PO TABS
100.0000 mg | ORAL_TABLET | Freq: Every day | ORAL | Status: DC
Start: 2014-07-17 — End: 2014-07-18
  Administered 2014-07-17: 100 mg via ORAL
  Filled 2014-07-17 (×4): qty 1

## 2014-07-17 MED ORDER — TRAZODONE HCL 100 MG PO TABS
100.0000 mg | ORAL_TABLET | Freq: Once | ORAL | Status: AC
  Administered 2014-07-17: 100 mg via ORAL
  Filled 2014-07-17: qty 1

## 2014-07-17 NOTE — [facility] Group Notes (Signed)
[facility] Group Notes:  (Clinical Social Work)  07/17/2014   11:15am-12:00pm  Summary of Progress/Problems:  The main focus of today's process group was to listen to a variety of genres of music and to identify that different types of music provoke different responses.  The patient then was able to identify personally what was soothing for them, as well as energizing.  Handouts were used to record feelings evoked, as well as how patient can personally use this knowledge in sleep habits, with depression, and with other symptoms.  The patient was in and out of the group numerous times, would get angry at some of the music saying he could do better.  He rapped over the top of some of the songs, but was quiet enough that others around him did not complain.  Type of Therapy:  Music Therapy   Participation Level:  Active  Participation Quality:  Intrusive  Affect:  Blunted  Cognitive:  Disorganized and Delusional  Insight:  Limited  Engagement in Therapy:  Limited  Modes of Intervention:   Activity, Exploration  Ambrose MantleMareida Grossman-Orr, LCSW 07/17/2014, 12:30pm

## 2014-07-17 NOTE — Progress Notes (Signed)
Despite multiple medications, Pt slept very little, and never an hour at a time.  Up most of night.

## 2014-07-17 NOTE — Progress Notes (Signed)
Patient continues to talk loud and pace the floor.  Agreed to try Trazodone for sleep.  Ordered Trazodone 100mg  one dose tonight.  Hulan FessIjeoma Hurman Ketelsen, PMHNP-BC

## 2014-07-17 NOTE — Progress Notes (Addendum)
Pt continues to be manic in hall, loudly talking and rapping.  Casimiro NeedleMichael, RN came to talk to Pt, has had good repor with Pt

## 2014-07-17 NOTE — Progress Notes (Signed)
D.  Pt pleasant on approach, after some initial agitation.  Pt states that he is very tired tonight and wishes to take his medication and go to bed early.  Pt was positive for evening wrapup group.  Has continued to be intrusive on the unit and speak very loudly.  Did quiet down after group.  Denies SI/HI/hallucinations at this time and spoke about wishing to go home as soon as is possible.  A.  Support and encouragement offered, medication given as ordered  R.  Pt remains safe on unit, will continue to monitor.

## 2014-07-17 NOTE — [facility] Group Notes (Signed)
The focus of this group is to educate the patient on the purpose and policies of crisis stabilization and provide a format to answer questions about their admission.  The group details unit policies and expectations of patients while admitted.  Patient did not attend 0900 nurse education orientation group this morning.  Patient stayed in bed.   

## 2014-07-17 NOTE — Progress Notes (Signed)
0259  Pt up in hall pacing

## 2014-07-17 NOTE — Progress Notes (Signed)
[facility] MD Progress Note  07/17/2014 11:03 AM [name]  MRN:  [id number] Subjective:Patient states "I am fine, better today. I feel normal". Pt reports he didn't sleep  last night. Energy and appetite are increased. Denies depression and anger and states mood is good. He continues to report that people are stealing from him. Also reports he is hyper sexual. Denies SI/HI, AVH. Taking meds as prescribed and denies SE. He is agreeable to try Seroquel for sleep tonight.   Objective: Patient seen and chart reviewed. Patient with hx of schizoaffective disorder, presented again after being discharged from here a week ago. Per mother pt did not do well after DC, had continued anger management issues, was noncompliant with medications and was not sleeping well at night. Patient continues to be hyperactive, manic, loud and disruptive, paranoid and not sleeping. He is observed pacing and rapping in the hallways.  Pt agreeable to take Prolixin.  Pt per staff has not been sleeping at night.       Principal Problem: Schizoaffective disorder, bipolar type Diagnosis:   Primary Psychiatric Diagnosis: Schizoaffective disorder,Bipolar type ,multiple episodes ,currently in acute episode   Secondary Psychiatric Diagnosis: Tobacco use disorder, severe   Non Psychiatric Diagnosis: See pmh Patient Active Problem List   Diagnosis Date Noted  . Tobacco use disorder [Z72.0]   . Schizoaffective disorder, bipolar type [F25.0] 06/29/2014   Total Time spent with patient: 30 minutes        Past Medical History:  Past Medical History  Diagnosis Date  . Paranoid schizophrenia   . Hyperlipidemia   . Schizophrenia   . Bipolar disorder     Past Surgical History  Procedure Laterality Date  . Testicle surgery     Family History:  Family History  Problem Relation Age of Onset  . Schizophrenia Neg Hx    Social History:  History  Alcohol Use No     History  Drug Use No    History   Social  History  . Marital Status: Single    Spouse Name: N/A  . Number of Children: N/A  . Years of Education: N/A   Social History Main Topics  . Smoking status: Current Every Day Smoker -- 1.00 packs/day    Types: Cigarettes  . Smokeless tobacco: Not on file  . Alcohol Use: No  . Drug Use: No  . Sexual Activity: Yes    Birth Control/ Protection: Condom   Other Topics Concern  . None   Social History Narrative   Additional History:    Sleep: Poor  Appetite:  Fair     Musculoskeletal: Strength & Muscle Tone: within normal limits Gait & Station: normal Patient leans: N/A   Psychiatric Specialty Exam: Physical Exam   Review of Systems  Psychiatric/Behavioral: Negative for depression, suicidal ideas, hallucinations and substance abuse. The patient is not nervous/anxious and does not have insomnia.     Blood pressure 134/89, pulse 95, temperature 97.4 F (36.3 C), temperature source Oral, resp. rate 18, height  (1.88 m), weight 106.595 kg (235 lb).Body mass index is 30.16 kg/(m^2).  General Appearance: Fairly Groomed  Patent attorney::  Good  Speech:  pushed   Volume:  Normal   Mood:  Euphoric   Affect:  Labile  Thought Process:  Tangential and more organized  Orientation:  Full (Time, Place, and Person)  Thought Content:  Delusions and Paranoid Ideation  Suicidal Thoughts:  No  Homicidal Thoughts:  No  Memory:  Immediate;   Fair  Recent;   Fair Remote;   Fair  Judgement:  Impaired  Insight:  Lacking  Psychomotor Activity:  Increased and Restlessness   Concentration:  Fair  Recall:  [employer]  Fund of Knowledge:Fair  Language: Fair  Akathisia:  No  Handed:  Right  AIMS (if indicated):   0  Assets:  Communication Skills Desire for Improvement  ADL's:  Intact  Cognition: WNL  Sleep:        Current Medications: Current Facility-Administered Medications  Medication Dose Route Frequency Provider Last Rate Last Dose  . antiseptic oral rinse (BIOTENE) solution  15 mL  15 mL Mouth Rinse PRN Jomarie LongsSaramma Eappen, MD   15 mL at 07/17/14 0828  . atorvastatin (LIPITOR) tablet 20 mg  20 mg Oral Daily Earney NavyJosephine C Onuoha, NP   20 mg at 07/17/14 0747  . benztropine (COGENTIN) tablet 1 mg  1 mg Oral BH-qamhs Saramma Eappen, MD   1 mg at 07/17/14 0748  . OLANZapine zydis (ZYPREXA) disintegrating tablet 10 mg  10 mg Oral TID PRN Jomarie LongsSaramma Eappen, MD   10 mg at 07/17/14 0753   And  . diphenhydrAMINE (BENADRYL) capsule 50 mg  50 mg Oral TID PRN Jomarie LongsSaramma Eappen, MD   50 mg at 07/17/14 0750  . OLANZapine (ZYPREXA) injection 10 mg  10 mg Intramuscular TID PRN Jomarie LongsSaramma Eappen, MD   10 mg at 07/16/14 1456   And  . diphenhydrAMINE (BENADRYL) injection 50 mg  50 mg Intramuscular TID PRN Jomarie LongsSaramma Eappen, MD   50 mg at 07/16/14 1456  . doxepin (SINEQUAN) capsule 25 mg  25 mg Oral QHS Oletta DarterSalina Douglass Dunshee, MD   25 mg at 07/16/14 2052  . fluPHENAZine (PROLIXIN) tablet 10 mg  10 mg Oral BH-qamhs Oletta DarterSalina Andyn Sales, MD   10 mg at 07/17/14 0749  . hydrOXYzine (ATARAX/VISTARIL) tablet 25 mg  25 mg Oral Q6H PRN Earney NavyJosephine C Onuoha, NP   25 mg at 07/17/14 0753  . ibuprofen (ADVIL,MOTRIN) tablet 400 mg  400 mg Oral Q4H PRN Adonis BrookSheila Agustin, NP   400 mg at 07/17/14 [id number]  . lamoTRIgine (LAMICTAL) tablet 25 mg  25 mg Oral Lucianne LeiBH-q7a Saramma Eappen, MD   25 mg at 07/17/14 0601  . lamoTRIgine (LAMICTAL) tablet 50 mg  50 mg Oral QHS Jomarie LongsSaramma Eappen, MD   50 mg at 07/16/14 2051  . multivitamin with minerals tablet 1 tablet  1 tablet Oral Daily Earney NavyJosephine C Onuoha, NP   1 tablet at 07/17/14 0748  . nicotine (NICODERM CQ - dosed in mg/24 hours) patch 21 mg  21 mg Transdermal Daily Jomarie LongsSaramma Eappen, MD   21 mg at 07/17/14 0749  . propranolol (INDERAL) tablet 10 mg  10 mg Oral TID Earney NavyJosephine C Onuoha, NP   10 mg at 07/17/14 [id number]    Lab Results:  No results found for this or any previous visit (from the past 48 hour(s)).  Physical Findings: AIMS: Facial and Oral Movements Muscles of Facial Expression: None, normal Lips and  Perioral Area: None, normal Jaw: None, normal Tongue: None, normal,Extremity Movements Upper (arms, wrists, hands, fingers): None, normal Lower (legs, knees, ankles, toes): None, normal, Trunk Movements Neck, shoulders, hips: None, normal, Overall Severity Severity of abnormal movements (highest score from questions above): None, normal Incapacitation due to abnormal movements: None, normal Patient's awareness of abnormal movements (rate only patient's report): No Awareness, Dental Status Current problems with teeth and/or dentures?: No Does patient usually wear dentures?: No  CIWA:  CIWA-Ar Total: 1 COWS:  COWS Total Score:  2  Assessment: Patient presented delusional as well as disorganized. Patient continues to have sleep issues , will continue medication readjustment.  Treatment Plan Summary: Daily contact with patient to assess and evaluate symptoms and progress in treatment and Medication management Prolixin  po bid.Plan to DC on LAI.  Lamictal to 75 mg po daily. Start trial of Seroquel  po qhs for sleep. ( pt was tried on Trazodone, ambien, Restoril, Doxepin with no effect.) . Will make available prn medications for agitation/anxiety. Will continue to monitor vitals ,medication compliance and treatment side effects while patient is here.  Reviewed labs ,will order as needed.  CSW will start working on disposition.  Patient to participate in therapeutic milieu .     Medical Decision Making:  Review of Psycho-Social Stressors (1), Established Problem, Worsening (2), Review of Medication Regimen & Side Effects (2) and Review of New Medication or Change in Dosage (2)     Michae Kava, Volanda Mangine MD 07/17/2014, 11:03 AM

## 2014-07-17 NOTE — Progress Notes (Signed)
Patient ID: Jodelle GreenBrian T XXXGunter, male   DOB: [birth year], 39 y.o.   MRN: [id number]   D: Pt has been very intrusive and labile on the unit today. Pt has required frequent redirection and despite redirection he remains very labile. Pt has been on the unit rapping continuously and he just blows up for no reason. Pt has no insight and does not have the ability to engage in treatment. Pt has required several prns to control aggressive behavior, with very little relief, but was able to go to sleep for about one hour. Pt reported that his depression was a 0, his anxiety as a 0, and his hopelessness as a 0. Pt reported that his goal for today was to work on going home. Pt reported being negative SI/HI, no AH/VH noted. A: 15 min checks continued for patient safety. R: Pt safety maintained.

## 2014-07-17 NOTE — Progress Notes (Signed)
Pt agitated and aggressive verbally this AM, virtually no sleep, blaming others.  Resistant to redirection by this RN, did respond somewhat better to male MHT.  Threatening gestures at times.  Became louder when asked to lower his voice at 0530.  Extremely labile

## 2014-07-17 NOTE — Progress Notes (Signed)
0045  Pt had become increasingly agitated due to frustration at inability to sleep.  Spoke with NP who ordered Trazodone 100 mg one time for tonight to see how Pt does with this.

## 2014-07-18 MED ORDER — FLUPHENAZINE HCL 5 MG PO TABS
15.0000 mg | ORAL_TABLET | Freq: Every day | ORAL | Status: DC
  Administered 2014-07-18 – 2014-07-20 (×3): 15 mg via ORAL
  Filled 2014-07-18 (×2): qty 3
  Filled 2014-07-18: qty 9
  Filled 2014-07-18 (×2): qty 3

## 2014-07-18 MED ORDER — FLUPHENAZINE HCL 5 MG PO TABS: 25.0000 mg | ORAL_TABLET | Freq: Every day | ORAL | Status: DC

## 2014-07-18 MED ORDER — FLUPHENAZINE HCL 5 MG PO TABS
10.0000 mg | ORAL_TABLET | ORAL | Status: DC
  Administered 2014-07-19 – 2014-07-21 (×3): 10 mg via ORAL
  Filled 2014-07-18 (×2): qty 1
  Filled 2014-07-18: qty 6
  Filled 2014-07-18 (×2): qty 1

## 2014-07-18 MED ORDER — PROPRANOLOL HCL 20 MG PO TABS
20.0000 mg | ORAL_TABLET | Freq: Three times a day (TID) | ORAL | Status: DC
  Administered 2014-07-18 – 2014-07-21 (×10): 20 mg via ORAL
  Filled 2014-07-18 (×15): qty 1

## 2014-07-18 MED ORDER — QUETIAPINE FUMARATE 25 MG PO TABS
125.0000 mg | ORAL_TABLET | Freq: Every day | ORAL | Status: DC
  Administered 2014-07-18: 125 mg via ORAL
  Filled 2014-07-18 (×3): qty 5

## 2014-07-18 MED ORDER — QUETIAPINE FUMARATE 100 MG PO TABS
100.0000 mg | ORAL_TABLET | Freq: Once | ORAL | Status: AC
  Administered 2014-07-18: 100 mg via ORAL

## 2014-07-18 NOTE — Progress Notes (Signed)
0145  Pt up states unable to go back to sleep, requested something to carry him through until "at least 5, that's when I get up and take a shower".   [facility] so requested one more dose for tonight.  Pt becomes increasingly labile when unable to sleep.  Notified NP who reviewed chart and placed one time order.  Pt pleased.

## 2014-07-18 NOTE — [facility] Group Notes (Signed)
[facility] LCSW Group Therapy  07/18/2014 , 3:47 PM   Type of Therapy:  Group Therapy  Participation Level:  Active  Participation Quality:  Attentive  Affect:  Appropriate  Cognitive:  Alert  Insight:  Improving  Engagement in Therapy:  Engaged  Modes of Intervention:  Discussion, Exploration and Socialization  Summary of Progress/Problems: Today's group focused on the term Diagnosis.  Participants were asked to define the term, and then pronounce whether it is a negative, positive or neutral term.  [name] became focused on the fact that if he had been diagnosed sooner, he would have been able to collect social security from his father. "I've got financial problems." Unable to engage in any other discussion.  [name], [name] 07/18/2014 , 3:47 PM

## 2014-07-18 NOTE — Progress Notes (Signed)
Pt alert and oriented. Pt took medications this morning. Pt was very anxious and received a Zyprexa Injection to the right deltoid. Pt denies any pain. Pt animated and disruptive during the day. Pt is redirectable and cooperative majority of the time. Pt attended groups. Pt denies SI/HI/AH/VH. No current concerns. RN will continue to monitor.

## 2014-07-18 NOTE — Progress Notes (Signed)
[facility] MD Progress Note  07/18/2014 11:21 AM [name]  MRN:  [id number] Subjective:Patient states "I was upset because of the lady who is always after me , she needs to stay away from me. I am also feeling sad about my step dad , he is going to die , he has has CHF. I want to apologize for my behavior."   Objective: Patient seen and chart reviewed. Reviewed notes from weekend by provider on call. Patient with hx of schizoaffective disorder, presented again after being discharged from here a week ago. Per mother pt did not do well after DC, had continued anger management issues, was noncompliant with medications and was not sleeping well at night. Patient seen on the unit as having labile moods , having periodic outbursts , easily provoked , being tearful at times , yelling , singing at other times. Pt continues to have manic sx.Will readjust his medications tonight, patient received PRN Zyprexa ,benadryl twice yesterday ( once last night). Will continue to observe. Pt slept well last night after receiving the prn medications.     Principal Problem: Schizoaffective disorder, bipolar type Diagnosis:   Primary Psychiatric Diagnosis: Schizoaffective disorder,Bipolar type ,multiple episodes ,currently in acute episode   Secondary Psychiatric Diagnosis: Tobacco use disorder, severe   Non Psychiatric Diagnosis: See pmh Patient Active Problem List   Diagnosis Date Noted  . Tobacco use disorder [Z72.0]   . Schizoaffective disorder, bipolar type [F25.0] 06/29/2014   Total Time spent with patient: 30 minutes        Past Medical History:  Past Medical History  Diagnosis Date  . Paranoid schizophrenia   . Hyperlipidemia   . Schizophrenia   . Bipolar disorder     Past Surgical History  Procedure Laterality Date  . Testicle surgery     Family History:  Family History  Problem Relation Age of Onset  . Schizophrenia Neg Hx    Social History:  History  Alcohol Use No      History  Drug Use No    History   Social History  . Marital Status: Single    Spouse Name: N/A  . Number of Children: N/A  . Years of Education: N/A   Social History Main Topics  . Smoking status: Current Every Day Smoker -- 1.00 packs/day    Types: Cigarettes  . Smokeless tobacco: Not on file  . Alcohol Use: No  . Drug Use: No  . Sexual Activity: Yes    Birth Control/ Protection: Condom   Other Topics Concern  . None   Social History Narrative   Additional History:    Sleep: Poor  Appetite:  Fair     Musculoskeletal: Strength & Muscle Tone: within normal limits Gait & Station: normal Patient leans: N/A   Psychiatric Specialty Exam: Physical Exam  Review of Systems  Psychiatric/Behavioral: The patient is nervous/anxious.     Blood pressure 111/75, pulse 95, temperature 97.8 F (36.6 C), temperature source Oral, resp. rate 18, height  (1.88 m), weight 106.595 kg (235 lb).Body mass index is 30.16 kg/(m^2).  General Appearance: Fairly Groomed  Patent attorney::  Good  Speech:  pushed   Volume:  Normal   Mood:  Euphoric   Affect:  Labile  Thought Process:  Tangential but more organized  Orientation:  Full (Time, Place, and Person)  Thought Content:  Delusions and Paranoid Ideation  Suicidal Thoughts:  No  Homicidal Thoughts:  No  Memory:  Immediate;   Fair Recent;  Fair Remote;   Fair  Judgement:  Impaired  Insight:  Lacking  Psychomotor Activity:  Increased and Restlessness , periodic outbursts  Concentration:  Fair  Recall:  [employer] of Knowledge:Fair  Language: Fair  Akathisia:  No  Handed:  Right  AIMS (if indicated):   0  Assets:  Communication Skills Desire for Improvement  ADL's:  Intact  Cognition: WNL  Sleep:  Number of Hours: 5.5     Current Medications: Current Facility-Administered Medications  Medication Dose Route Frequency Provider Last Rate Last Dose  . antiseptic oral rinse (BIOTENE) solution 15 mL  15 mL Mouth Rinse  PRN Jomarie Longs, MD   15 mL at 07/17/14 2106  . atorvastatin (LIPITOR) tablet 20 mg  20 mg Oral Daily Earney Navy, NP   20 mg at 07/18/14 0758  . benztropine (COGENTIN) tablet 1 mg  1 mg Oral BH-qamhs Malik Paar, MD   1 mg at 07/18/14 0759  . OLANZapine zydis (ZYPREXA) disintegrating tablet 10 mg  10 mg Oral TID PRN Jomarie Longs, MD   10 mg at 07/18/14 0145   And  . diphenhydrAMINE (BENADRYL) capsule 50 mg  50 mg Oral TID PRN Jomarie Longs, MD   50 mg at 07/18/14 0145  . OLANZapine (ZYPREXA) injection 10 mg  10 mg Intramuscular TID PRN Jomarie Longs, MD   10 mg at 07/18/14 1034   And  . diphenhydrAMINE (BENADRYL) injection 50 mg  50 mg Intramuscular TID PRN Jomarie Longs, MD   50 mg at 07/16/14 1456  . [START ON 07/19/2014] fluPHENAZine (PROLIXIN) tablet 10 mg  10 mg Oral BH-q7a Destenie Ingber, MD      . fluPHENAZine (PROLIXIN) tablet 15 mg  15 mg Oral QHS Amerie Beaumont, MD      . hydrOXYzine (ATARAX/VISTARIL) tablet 25 mg  25 mg Oral Q6H PRN Earney Navy, NP   25 mg at 07/17/14 0753  . ibuprofen (ADVIL,MOTRIN) tablet 400 mg  400 mg Oral Q4H PRN Adonis Brook, NP   400 mg at 07/18/14 1610  . lamoTRIgine (LAMICTAL) tablet 25 mg  25 mg Oral Lucianne Lei, MD   25 mg at 07/18/14 0616  . lamoTRIgine (LAMICTAL) tablet 50 mg  50 mg Oral QHS Jomarie Longs, MD   50 mg at 07/17/14 2112  . multivitamin with minerals tablet 1 tablet  1 tablet Oral Daily Earney Navy, NP   1 tablet at 07/18/14 0759  . nicotine (NICODERM CQ - dosed in mg/24 hours) patch 21 mg  21 mg Transdermal Daily Jomarie Longs, MD   21 mg at 07/18/14 0758  . propranolol (INDERAL) tablet 20 mg  20 mg Oral TID Jomarie Longs, MD      . QUEtiapine (SEROQUEL) tablet 125 mg  125 mg Oral QHS Jomarie Longs, MD        Lab Results:  No results found for this or any previous visit (from the past 48 hour(s)).  Physical Findings: AIMS: Facial and Oral Movements Muscles of Facial Expression: None,  normal Lips and Perioral Area: None, normal Jaw: None, normal Tongue: None, normal,Extremity Movements Upper (arms, wrists, hands, fingers): None, normal Lower (legs, knees, ankles, toes): None, normal, Trunk Movements Neck, shoulders, hips: None, normal, Overall Severity Severity of abnormal movements (highest score from questions above): None, normal Incapacitation due to abnormal movements: None, normal Patient's awareness of abnormal movements (rate only patient's report): No Awareness, Dental Status Current problems with teeth and/or dentures?: No Does patient usually wear  dentures?: No  CIWA:  CIWA-Ar Total: 1 COWS:  COWS Total Score: 2  Assessment: Patient presented delusional as well as disorganized. Patient continues to have mood lability , with periodic outbursts , sleep difficulty.  Treatment Plan Summary: Daily contact with patient to assess and evaluate symptoms and progress in treatment and Medication management Increase Prolixin to 25 mg po daily .Plan to DC on LAI.  Continue Lamictal  75 mg po daily. Increase Seroquel to125 mg po qhs for sleep. ( pt was tried on Trazodone, ambien, Restoril, Doxepin with no effect.) . Will make available prn medications for agitation/anxiety. Will continue to monitor vitals ,medication compliance and treatment side effects while patient is here.  Reviewed labs ,will order as needed.  CSW will start working on disposition.  Patient to participate in therapeutic milieu .     Medical Decision Making:  Review of Psycho-Social Stressors (1), Review and summation of old records (2), Order AIMS Test (2), Review of Last Therapy Session (1), Review of Medication Regimen & Side Effects (2) and Review of New Medication or Change in Dosage (2)     Emanuelle Hammerstrom MD 07/18/2014, 11:21 AM

## 2014-07-18 NOTE — Progress Notes (Signed)
Pt slept well last night.  After getting up and receiving additional medication at 0145 Pt slept rest of night.

## 2014-07-18 NOTE — Progress Notes (Signed)
Patient ID: James GreenBrian T XXXGunter, male   DOB: 1975-10-22, 39 y.o.   MRN: [id number]  D: client visible on the unit, rapping in the halls. Denies AVH, SHI. Client complains that he was supposed to be discharged "my mom waited on the doctor to call her today" Client also reports disagreement with mom about living with her and that she manages his money. "I don't like being given a daily allowance, like a kid" A: Writer provided emotional support, encouraged client to consider that money management may be a factor and that was why mom was handling his money.  Staff will monitor q4015min for safety. R: Client is safe on the unit. Client admitted at one point he was gambling with his money, "but I don't do that anymore"

## 2014-07-18 NOTE — Progress Notes (Signed)
Patient ID: James GreenBrian T [name], male   DOB: 1976-01-07, 39 y.o.   MRN: [id number] PER STATE REGULATIONS 482.30  THIS CHART WAS REVIEWED FOR MEDICAL NECESSITY WITH RESPECT TO THE PATIENT'S ADMISSION/ DURATION OF STAY.  NEXT REVIEW DATE: 07/21/2014  Willa RoughJENNIFER JONES James Bence, RN, BSN CASE MANAGER

## 2014-07-18 NOTE — [facility] Group Notes (Signed)
[facility]   07/18/2014 1:07 PM  Participation Quality:  Invited.  Chose to not attend    [name], [name]

## 2014-07-19 MED ORDER — HYDROXYZINE HCL 25 MG PO TABS
25.0000 mg | ORAL_TABLET | Freq: Three times a day (TID) | ORAL | Status: DC | PRN
  Administered 2014-07-19: 25 mg via ORAL
  Filled 2014-07-19 (×3): qty 10
  Filled 2014-07-19: qty 1

## 2014-07-19 MED ORDER — LAMOTRIGINE 25 MG PO TABS
75.0000 mg | ORAL_TABLET | Freq: Every day | ORAL | Status: DC
  Administered 2014-07-19 – 2014-07-20 (×2): 75 mg via ORAL
  Filled 2014-07-19: qty 9
  Filled 2014-07-19 (×3): qty 3

## 2014-07-19 MED ORDER — QUETIAPINE FUMARATE 200 MG PO TABS
200.0000 mg | ORAL_TABLET | Freq: Every day | ORAL | Status: DC
  Administered 2014-07-19: 200 mg via ORAL
  Filled 2014-07-19 (×2): qty 1

## 2014-07-19 MED ORDER — HYDROXYZINE HCL 50 MG PO TABS
50.0000 mg | ORAL_TABLET | Freq: Every day | ORAL | Status: DC
  Administered 2014-07-20 (×2): 50 mg via ORAL
  Filled 2014-07-19 (×5): qty 1

## 2014-07-19 NOTE — Progress Notes (Signed)
[facility] MD Progress Note  07/19/2014 2:14 PM [name]  MRN:  [id number] Subjective:Patient states "I woke up just once last night and took a pill and went back to sleep and had a good night sleep thereafter.'   Objective: Patient seen and chart reviewed. Patient with hx of schizoaffective disorder, presented again after being discharged from here a week ago. Per mother pt did not do well after DC, had continued anger management issues, was noncompliant with medications and was not sleeping well at night. Patient reports better mood and also reports sleep good last night after getting a pen Vistaril. Patient per staff has been having some periodic outbursts when he is seen as irritable and having anger issues. Pt also continues to pace in the hallways , singing loudly , but the frequency of it has reduced since admission. Pt encouraged to attend groups , use coping skills to manage his outbursts.     Principal Problem: Schizoaffective disorder, bipolar type Diagnosis:   Primary Psychiatric Diagnosis: Schizoaffective disorder,Bipolar type ,multiple episodes ,currently in acute episode   Secondary Psychiatric Diagnosis: Tobacco use disorder, severe   Non Psychiatric Diagnosis: See pmh Patient Active Problem List   Diagnosis Date Noted  . Tobacco use disorder [Z72.0]   . Schizoaffective disorder, bipolar type [F25.0] 06/29/2014   Total Time spent with patient: 30 minutes        Past Medical History:  Past Medical History  Diagnosis Date  . Paranoid schizophrenia   . Hyperlipidemia   . Schizophrenia   . Bipolar disorder     Past Surgical History  Procedure Laterality Date  . Testicle surgery     Family History:  Family History  Problem Relation Age of Onset  . Schizophrenia Neg Hx    Social History:  History  Alcohol Use No     History  Drug Use No    History   Social History  . Marital Status: Single    Spouse Name: N/A  . Number of Children: N/A   . Years of Education: N/A   Social History Main Topics  . Smoking status: Current Every Day Smoker -- 1.00 packs/day    Types: Cigarettes  . Smokeless tobacco: Not on file  . Alcohol Use: No  . Drug Use: No  . Sexual Activity: Yes    Birth Control/ Protection: Condom   Other Topics Concern  . None   Social History Narrative   Additional History:    Sleep: Fair  Appetite:  Fair     Musculoskeletal: Strength & Muscle Tone: within normal limits Gait & Station: normal Patient leans: N/A   Psychiatric Specialty Exam: Physical Exam  Review of Systems  Psychiatric/Behavioral: Positive for depression. The patient is nervous/anxious and has insomnia.     Blood pressure 112/80, pulse 75, temperature 97.8 F (36.6 C), temperature source Oral, resp. rate 15, height  (1.88 m), weight 106.595 kg (235 lb).Body mass index is 30.16 kg/(m^2).  General Appearance: Fairly Groomed  Patent attorney::  Good  Speech:  pushed   Volume:  Normal   Mood:  Euphoric   Affect:  Labile improving  Thought Process: more organized  Orientation:  Full (Time, Place, and Person)  Thought Content:  Paranoid Ideation  Suicidal Thoughts:  No  Homicidal Thoughts:  No  Memory:  Immediate;   Fair Recent;   Fair Remote;   Fair  Judgement:  Impaired  Insight:  Lacking  Psychomotor Activity:  Increased and Restlessness , periodic outbursts  Concentration:  Fair  Recall:  [employer]  Fund of Knowledge:Fair  Language: Fair  Akathisia:  No  Handed:  Right  AIMS (if indicated):   0  Assets:  Communication Skills Desire for Improvement  ADL's:  Intact  Cognition: WNL  Sleep:  Number of Hours: 5.5     Current Medications: Current Facility-Administered Medications  Medication Dose Route Frequency Provider Last Rate Last Dose  . antiseptic oral rinse (BIOTENE) solution 15 mL  15 mL Mouth Rinse PRN Jomarie LongsSaramma Wandell Scullion, MD   15 mL at 07/19/14 1304  . atorvastatin (LIPITOR) tablet 20 mg  20 mg Oral Daily  Earney NavyJosephine C Onuoha, NP   20 mg at 07/19/14 0737  . benztropine (COGENTIN) tablet 1 mg  1 mg Oral BH-qamhs Josseline Reddin, MD   1 mg at 07/19/14 0737  . OLANZapine zydis (ZYPREXA) disintegrating tablet 10 mg  10 mg Oral TID PRN Jomarie LongsSaramma Keyoni Lapinski, MD   10 mg at 07/18/14 0145   And  . diphenhydrAMINE (BENADRYL) capsule 50 mg  50 mg Oral TID PRN Jomarie LongsSaramma Sheron Tallman, MD   50 mg at 07/18/14 0145  . OLANZapine (ZYPREXA) injection 10 mg  10 mg Intramuscular TID PRN Jomarie LongsSaramma Joyous Gleghorn, MD   10 mg at 07/18/14 1034   And  . diphenhydrAMINE (BENADRYL) injection 50 mg  50 mg Intramuscular TID PRN Jomarie LongsSaramma Derald Lorge, MD   50 mg at 07/16/14 1456  . fluPHENAZine (PROLIXIN) tablet 10 mg  10 mg Oral Lucianne LeiBH-q7a Dazja Houchin, MD   10 mg at 07/19/14 [id number]  . fluPHENAZine (PROLIXIN) tablet 15 mg  15 mg Oral QHS Jomarie LongsSaramma Yuepheng Schaller, MD   15 mg at 07/18/14 2121  . hydrOXYzine (ATARAX/VISTARIL) tablet 25 mg  25 mg Oral TID PRN Jomarie LongsSaramma Raisa Ditto, MD      . hydrOXYzine (ATARAX/VISTARIL) tablet 50 mg  50 mg Oral QHS Elva Breaker, MD      . ibuprofen (ADVIL,MOTRIN) tablet 400 mg  400 mg Oral Q4H PRN Adonis BrookSheila Agustin, NP   400 mg at 07/19/14 1317  . lamoTRIgine (LAMICTAL) tablet 25 mg  25 mg Oral Lucianne LeiBH-q7a Jacky Dross, MD   25 mg at 07/19/14 [id number]  . lamoTRIgine (LAMICTAL) tablet 75 mg  75 mg Oral QHS Jomarie LongsSaramma Omarian Jaquith, MD      . multivitamin with minerals tablet 1 tablet  1 tablet Oral Daily Earney NavyJosephine C Onuoha, NP   1 tablet at 07/19/14 0737  . nicotine (NICODERM CQ - dosed in mg/24 hours) patch 21 mg  21 mg Transdermal Daily Jomarie LongsSaramma Shourya Macpherson, MD   21 mg at 07/19/14 0736  . propranolol (INDERAL) tablet 20 mg  20 mg Oral TID Jomarie LongsSaramma Tracye Szuch, MD   20 mg at 07/19/14 1204  . QUEtiapine (SEROQUEL) tablet 200 mg  200 mg Oral QHS Jomarie LongsSaramma Merisa Julio, MD        Lab Results:  No results found for this or any previous visit (from the past 48 hour(s)).  Physical Findings: AIMS: Facial and Oral Movements Muscles of Facial Expression: None, normal Lips and Perioral  Area: None, normal Jaw: None, normal Tongue: None, normal,Extremity Movements Upper (arms, wrists, hands, fingers): None, normal Lower (legs, knees, ankles, toes): None, normal, Trunk Movements Neck, shoulders, hips: None, normal, Overall Severity Severity of abnormal movements (highest score from questions above): None, normal Incapacitation due to abnormal movements: None, normal Patient's awareness of abnormal movements (rate only patient's report): No Awareness, Dental Status Current problems with teeth and/or dentures?: No Does patient usually wear dentures?: No  CIWA:  CIWA-Ar  Total: 1 COWS:  COWS Total Score: 2  Assessment: Patient presented delusional as well as disorganized. Patient did have improved sleep last night, pt continues to have periodic outburst.  Treatment Plan Summary: Daily contact with patient to assess and evaluate symptoms and progress in treatment and Medication management Continue Prolixin  25 mg po daily .Plan to DC on LAI. Increase Lamictal to 100 mg po daily. Increase Seroquel to 200 mg po qhs for sleep. ( pt was tried on Trazodone, ambien, Restoril, Doxepin with no effect.) . Patient received a dose of vistaril last night which helped him sleep, will add Vistaril 50 mg po qhs for sleep. Will make available prn medications for agitation/anxiety. Will continue to monitor vitals ,medication compliance and treatment side effects while patient is here.  Reviewed labs ,will order as needed.  CSW will start working on disposition.  Patient to participate in therapeutic milieu .     Medical Decision Making:  Review of Psycho-Social Stressors (1), Review and summation of old records (2), Review of Last Therapy Session (1), Review of Medication Regimen & Side Effects (2) and Review of New Medication or Change in Dosage (2)     Amaya Blakeman MD 07/19/2014, 2:14 PM

## 2014-07-19 NOTE — Plan of Care (Signed)
Problem: Alteration in thought process Goal: LTG-Patient verbalizes understanding importance med regimen (Patient verbalizes understanding of importance of medication regimen and need to continue outpatient care.)  Outcome: Progressing Client takes his medication without incidence and will ask for it. "I need to take my medicine"

## 2014-07-19 NOTE — Progress Notes (Signed)
Patient ID: [name], male   DOB: [birth year], 39 y.o.   MRN: [id number] D: Client reports "had a little episode today I got angry because I couldn't go out and play basketball, they didn't wake me up, but I'm all right now" "thought I was going home, but I didn't so, it's all right though, I don't care how long I stay" A: Writer provided emotional support, encouraged client try to communicate with staff his needs so bouts of anger can be avoided. Staff will monitor q6115min for safety. R: Client is safe on the unit, attended group.

## 2014-07-19 NOTE — Plan of Care (Signed)
Problem: Ineffective individual coping Goal: LTG: Patient will report a decrease in negative feelings Outcome: Progressing Pt reports "I'm doing all right"

## 2014-07-19 NOTE — Tx Team (Signed)
  Interdisciplinary Treatment Plan Update   Date Reviewed:  07/19/2014  Time Reviewed:  2:03 PM  Progress in Treatment:   Attending groups: Yes Participating in groups: Yes Taking medication as prescribed: Yes  Tolerating medication: Yes Family/Significant other contact made: Yes  Patient understands diagnosis: Yes  Discussing patient identified problems/goals with staff: Yes  See initial care plan Medical problems stabilized or resolved: Yes Denies suicidal/homicidal ideation: Yes  In tx team Patient has not harmed self or others: Yes  For review of initial/current patient goals, please see plan of care.  Estimated Length of Stay:  4-5 days  Reason for Continuation of Hospitalization: Mania Medication stabilization Other; describe agitation, disorganization, paranoia  New Problems/Goals identified:  N/A  Discharge Plan or Barriers:   return home, follow up outpt ACT team  Additional Comments:  "I was upset because of the lady who is always after me , she needs to stay away from me. I am also feeling sad about my step dad , he is going to die , he has has CHF. I want to apologize for my behavior."  Patient with hx of schizoaffective disorder, presented again after being discharged from here a week ago. Per mother pt did not do well after DC, had continued anger management issues, was noncompliant with medications and was not sleeping well at night. Patient seen on the unit as having labile moods , having periodic outbursts , easily provoked , being tearful at times , yelling , singing at other times. Pt continues to have manic sx.Will readjust his medications tonight, patient received PRN Zyprexa ,benadryl twice yesterday ( once last night). Will continue to observe.  Med changes include start Prolixin with idea of injection ultimately, continue Lamictal and change bedtime Seroquel to Zyprexa as he gets it nightly as PRN anyway.  Attendees:  Signature: Ivin BootySarama Eappen, MD 07/19/2014  2:03 PM   Signature: Richelle Itood Rhen Kawecki, LCSW 07/19/2014 2:03 PM  Signature: Fransisca KaufmannLaura Davis, NP 07/19/2014 2:03 PM  Signature: Kathi SimpersSarah Twyman, RN 07/19/2014 2:03 PM  Signature:  07/19/2014 2:03 PM  Signature:  07/19/2014 2:03 PM  Signature:   07/19/2014 2:03 PM  Signature:    Signature:    Signature:    Signature:    Signature:    Signature:      Scribe for Treatment Team:   Richelle Itood Kia Varnadore, LCSW  07/19/2014 2:03 PM

## 2014-07-19 NOTE — Plan of Care (Signed)
Problem: Ineffective individual coping Goal: STG-Increase in ability to manage activities of daily living Outcome: Progressing Client practices good hygiene, dresses appropriate, meticulous about hair ("I put Vaseline on it".)

## 2014-07-19 NOTE — Progress Notes (Signed)
Patient ID: James GreenBrian T [name], male   DOB: [birth year], [age] y.o.   MRN: [id number]  Pt experiencing increased agitation this afternoon. Pt states "I thought I was going to go home either today or tomorrow. My mom says she hasn't talked to the doctor or the doctor hasn't talked to her. Well, maybe she has, my mom may not have told me. She does things like that." Pt asks to get a lawyer, then asks to sign a 72 hour form. Pt pacing, cursing and "rapping" very loudly in the hallway. Pt also requests writer call his mother. Pt refuses to sign the consent form and then the 72 hour form.  Will continue to monitor.

## 2014-07-19 NOTE — Plan of Care (Signed)
Problem: Alteration in thought process Goal: STG-Patient is able to follow short directions Outcome: Progressing Pt redirectable, pt assigned same nurse multiple times, rapport established

## 2014-07-19 NOTE — Progress Notes (Signed)
Patient ID: Jodelle GreenBrian T [name], male   DOB: Feb 14, 1976, 39 y.o.   MRN: [id number] Adult Psychoeducational Group Note  Date:  07/19/2014 Time: 0900  Group Topic/Focus:  Orientation:   The focus of this group is to educate the patient on the purpose and policies of crisis stabilization and provide a format to answer questions about their admission.  The group details unit policies and expectations of patients while admitted.  Participation Level:  Active  Participation Quality:  Appropriate  Affect:  Flat  Cognitive:  Appropriate  Insight: Improving  Engagement in Group:  Improving  Modes of Intervention:  Discussion, Education and Support  Additional Comments:  Pt able to identify one daily goal to accomplish.   Aurora Maskwyman, Yanin Muhlestein E 07/19/2014, 11:23 AM

## 2014-07-19 NOTE — Progress Notes (Signed)
Patient ID: Jodelle GreenBrian T XXXGunter, male   DOB: 1976-03-12, 39 y.o.   MRN: [id number]   Pt currently presents with an animated affect and anxious, irritable behavior. Pt seen pacing the hall throughout the day today. Per self inventory, pt rates depression, hopelessnessand anxiety at a 0. [name] daily goal is "going home" and they intend to do so by "go home." Pt reports good sleep, concentration and appetite. Pt states "Why is the doctor and my mother micommunicating?" Pt states mother is his "payee."  Pt provided with medications per providers orders. [name] labs and vitals were monitored throughout the day. Pt supported emotionally and encouraged to express concerns and questions. Pt educated on medications and reoriented to unit rules.  [name] safety ensured with 15 minute and environmental checks. Pt currently denies SI/HI and A/V hallucinations. Pt verbally agrees to seek staff if SI/HI or A/VH occurs and to consult with staff before acting on these thoughts. Pt exhibits frequent loud outbursts throughout the day. Pt has logical conversation with Clinical research associatewriter about discharge plan.

## 2014-07-19 NOTE — Progress Notes (Signed)
Adult Psychoeducational Group Note  Date:  07/19/2014 Time:  9:15 PM  Group Topic/Focus:  Wrap-Up Group:   The focus of this group is to help patients review their daily goal of treatment and discuss progress on daily workbooks.  Participation Level:  Minimal  Participation Quality:  Appropriate  Affect:  Flat  Cognitive:  Lacking  Insight: Limited  Engagement in Group:  Engaged  Modes of Intervention:  Socialization and Support  Additional Comments:  Patient attended and participated in group tonight. He reports having an OK day. He did became upset earlier because he was sleeping an did not get a chance to go down to the gym with the group to play basketball. He advised that he realized that he did not informed anyone to wake him up therefore he was at fault. He quickly got over it and now feeling better. The patient reports that today he went for his meals, attended groups and spoke with his doctor.                                                                                                                                                                                                                                         Lita MainsFrancis, Yalanda Soderman [facility]    07/19/2014, 9:15 PM

## 2014-07-20 MED ORDER — ALUM & MAG HYDROXIDE-SIMETH 200-200-20 MG/5ML PO SUSP
30.0000 mL | ORAL | Status: DC | PRN
  Administered 2014-07-20: 30 mL via ORAL
  Filled 2014-07-20: qty 30

## 2014-07-20 MED ORDER — FLUPHENAZINE DECANOATE 25 MG/ML IJ SOLN
25.0000 mg | INTRAMUSCULAR | Status: DC
  Administered 2014-07-20: 25 mg via INTRAMUSCULAR
  Filled 2014-07-20: qty 1

## 2014-07-20 MED ORDER — QUETIAPINE FUMARATE 300 MG PO TABS
300.0000 mg | ORAL_TABLET | Freq: Every day | ORAL | Status: DC
  Administered 2014-07-20: 300 mg via ORAL
  Filled 2014-07-20 (×2): qty 1
  Filled 2014-07-20: qty 3

## 2014-07-20 NOTE — Progress Notes (Signed)
[facility] MD Progress Note  07/20/2014 1:46 PM KYNDALL CHAPLIN  MRN:  [id number] Subjective:Patient states "I woke up only once and got a prn vistaril and went back to sleep, I slept like 12 hrs yesterday.'   Objective: Patient seen and chart reviewed. Patient with hx of schizoaffective disorder, presented again after being discharged from here a week ago. Per mother pt did not do well after DC, had continued anger management issues, was noncompliant with medications and was not sleeping well at night. Patient seen today , continues to improve , does have on and off mood lability as well as outbursts , but is redirectable. Pt reports sleep as improved the past two days . Pt denies SI/HI/AH/VH. Pt denies any new concerns . Discussed with patient about LAI , pt is agreeable. Will provide Prolixin decanoate today. Pt since admission has been focussed on getting anger management training, CSW to help pt with this. Pt encouraged to attend groups , use coping skills to manage his outbursts.     Principal Problem: Schizoaffective disorder, bipolar type Diagnosis:   Primary Psychiatric Diagnosis: Schizoaffective disorder,Bipolar type ,multiple episodes ,currently in acute episode   Secondary Psychiatric Diagnosis: Tobacco use disorder, severe   Non Psychiatric Diagnosis: See pmh Patient Active Problem List   Diagnosis Date Noted  . Tobacco use disorder [Z72.0]   . Schizoaffective disorder, bipolar type [F25.0] 06/29/2014   Total Time spent with patient: 30 minutes        Past Medical History:  Past Medical History  Diagnosis Date  . Paranoid schizophrenia   . Hyperlipidemia   . Schizophrenia   . Bipolar disorder     Past Surgical History  Procedure Laterality Date  . Testicle surgery     Family History:  Family History  Problem Relation Age of Onset  . Schizophrenia Neg Hx    Social History:  History  Alcohol Use No     History  Drug Use No    History   Social  History  . Marital Status: Single    Spouse Name: N/A  . Number of Children: N/A  . Years of Education: N/A   Social History Main Topics  . Smoking status: Current Every Day Smoker -- 1.00 packs/day    Types: Cigarettes  . Smokeless tobacco: Not on file  . Alcohol Use: No  . Drug Use: No  . Sexual Activity: Yes    Birth Control/ Protection: Condom   Other Topics Concern  . None   Social History Narrative   Additional History:    Sleep: Fair  Appetite:  Fair     Musculoskeletal: Strength & Muscle Tone: within normal limits Gait & Station: normal Patient leans: N/A   Psychiatric Specialty Exam: Physical Exam  Review of Systems  Psychiatric/Behavioral: The patient is nervous/anxious.     Blood pressure 110/78, pulse 101, temperature 98.3 F (36.8 C), temperature source Oral, resp. rate 20, height  (1.88 m), weight 106.595 kg (235 lb).Body mass index is 30.16 kg/(m^2).  General Appearance: Fairly Groomed  Patent attorney::  Good  Speech:  pushed   Volume:  Normal   Mood:  Euphoric   Affect:  Labile improving  Thought Process: more organized  Orientation:  Full (Time, Place, and Person)  Thought Content:  Paranoid Ideation improving  Suicidal Thoughts:  No  Homicidal Thoughts:  No  Memory:  Immediate;   Fair Recent;   Fair Remote;   Fair  Judgement:  Impaired  Insight:  Lacking  Psychomotor Activity:  Increased and Restlessness , periodic outbursts with improvement  Concentration:  Fair  Recall:  [employer] of Knowledge:Fair  Language: Fair  Akathisia:  No  Handed:  Right  AIMS (if indicated):   0  Assets:  Communication Skills Desire for Improvement  ADL's:  Intact  Cognition: WNL  Sleep:  Number of Hours: 5.5     Current Medications: Current Facility-Administered Medications  Medication Dose Route Frequency Provider Last Rate Last Dose  . antiseptic oral rinse (BIOTENE) solution 15 mL  15 mL Mouth Rinse PRN Jomarie Longs, MD   15 mL at  07/20/14 0859  . atorvastatin (LIPITOR) tablet 20 mg  20 mg Oral Daily Earney Navy, NP   20 mg at 07/20/14 0759  . benztropine (COGENTIN) tablet 1 mg  1 mg Oral BH-qamhs Chrystian Ressler, MD   1 mg at 07/20/14 0759  . OLANZapine zydis (ZYPREXA) disintegrating tablet 10 mg  10 mg Oral TID PRN Jomarie Longs, MD   10 mg at 07/18/14 0145   And  . diphenhydrAMINE (BENADRYL) capsule 50 mg  50 mg Oral TID PRN Jomarie Longs, MD   50 mg at 07/18/14 0145  . OLANZapine (ZYPREXA) injection 10 mg  10 mg Intramuscular TID PRN Jomarie Longs, MD   10 mg at 07/18/14 1034   And  . diphenhydrAMINE (BENADRYL) injection 50 mg  50 mg Intramuscular TID PRN Jomarie Longs, MD   50 mg at 07/16/14 1456  . fluPHENAZine (PROLIXIN) tablet 10 mg  10 mg Oral Lucianne Lei, MD   10 mg at 07/20/14 0640  . fluPHENAZine (PROLIXIN) tablet 15 mg  15 mg Oral QHS Jomarie Longs, MD   15 mg at 07/19/14 2138  . fluPHENAZine decanoate (PROLIXIN) injection 25 mg  25 mg Intramuscular Q14 Days Zamir Staples, MD      . hydrOXYzine (ATARAX/VISTARIL) tablet 25 mg  25 mg Oral TID PRN Jomarie Longs, MD   25 mg at 07/19/14 1452  . hydrOXYzine (ATARAX/VISTARIL) tablet 50 mg  50 mg Oral QHS Jomarie Longs, MD   50 mg at 07/20/14 0103  . ibuprofen (ADVIL,MOTRIN) tablet 400 mg  400 mg Oral Q4H PRN Adonis Brook, NP   400 mg at 07/19/14 1317  . lamoTRIgine (LAMICTAL) tablet 25 mg  25 mg Oral Lucianne Lei, MD   25 mg at 07/20/14 1610  . lamoTRIgine (LAMICTAL) tablet 75 mg  75 mg Oral QHS Jomarie Longs, MD   75 mg at 07/19/14 2138  . multivitamin with minerals tablet 1 tablet  1 tablet Oral Daily Earney Navy, NP   1 tablet at 07/20/14 0800  . nicotine (NICODERM CQ - dosed in mg/24 hours) patch 21 mg  21 mg Transdermal Daily Jomarie Longs, MD   21 mg at 07/20/14 0646  . propranolol (INDERAL) tablet 20 mg  20 mg Oral TID Jomarie Longs, MD   20 mg at 07/20/14 0759  . QUEtiapine (SEROQUEL) tablet 300 mg  300 mg Oral QHS  Jomarie Longs, MD        Lab Results:  No results found for this or any previous visit (from the past 48 hour(s)).  Physical Findings: AIMS: Facial and Oral Movements Muscles of Facial Expression: None, normal Lips and Perioral Area: None, normal Jaw: None, normal Tongue: None, normal,Extremity Movements Upper (arms, wrists, hands, fingers): None, normal Lower (legs, knees, ankles, toes): None, normal, Trunk Movements Neck, shoulders, hips: None, normal, Overall Severity Severity of abnormal movements (highest score  from questions above): None, normal Incapacitation due to abnormal movements: None, normal Patient's awareness of abnormal movements (rate only patient's report): No Awareness, Dental Status Current problems with teeth and/or dentures?: No Does patient usually wear dentures?: No  CIWA:  CIWA-Ar Total: 1 COWS:  COWS Total Score: 2  Assessment: Patient presented delusional as well as disorganized. Patient did have improved sleep last night, pt with periodic mood lability as well as outbursts , but has improved since admission.  Treatment Plan Summary: Daily contact with patient to assess and evaluate symptoms and progress in treatment and Medication management Continue Prolixin  25 mg po daily . Will provide Prolixin decanoate 25 mg IM today ,  q14 days. Continue Lamictal to 100 mg po daily. Increase Seroquel to 300 mg po qhs for sleep. ( pt was tried on Trazodone, ambien, Restoril, Doxepin with no effect.) . Continue Vistaril 50 mg po qhs for sleep. Will make available prn medications for agitation/anxiety. Will continue to monitor vitals ,medication compliance and treatment side effects while patient is here.  Reviewed labs ,will order as needed.  CSW will start working on disposition.  Patient to participate in therapeutic milieu .     Medical Decision Making:  Review of Psycho-Social Stressors (1), Review and summation of old records (2), Review of Last  Therapy Session (1), Review of Medication Regimen & Side Effects (2) and Review of New Medication or Change in Dosage (2)     Maycee Blasco MD 07/20/2014, 1:46 PM

## 2014-07-20 NOTE — Progress Notes (Signed)
Assumed care of pt at 2300.  Report received from previous RN.  Pt is resting in his room with eyes closed.  Respirations are even and unlabored.  No distress noted.  Will continue to monitor and assess for safety.
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
## 2014-07-20 NOTE — Progress Notes (Signed)
Patient ID: James GreenBrian T [name], male   DOB: Sep 23, 1975, 39 y.o.   MRN: [id number]   D: After the introduction pt informed the writer that he "can't afford this", referring to his hospitalization. Stated, "some of this is coming out of my pocket". Pt stated he has a gambling problem and because of that his mother gives him an allowance. Pt stated he's "excited about getting out of here". Stated he misses his car, and hopes to get another one.    A:  Support and encouragement was offered. 15 min checks continued for safety.  R: Pt remains safe.

## 2014-07-20 NOTE — Progress Notes (Signed)
  [facility] Discharge Plan :  Will you be returning to the same living situation after discharge:  Yes,  home At discharge, do you have transportation home?: Yes,  family Do you have the ability to pay for your medications: Yes,  MCD  Release of information consent forms completed and in the chart;  Patient's signature needed at discharge.  Patient to Follow up at: Follow-up Information    Follow up with [facility] On 07/25/2014.   Why:  Someone from the ACT team will come to meet with you on Monday.  Call them to confirm the time.   Contact information:   [address] [place], [place] [address] Phone: [phone] Fax: [phone]      Patient denies SI/HI: Yes,  yes    Safety Planning and Suicide Prevention discussed: Yes,  yes     Has patient been referred to the Quitline?: Patient refused referral  Ida Rogueorth, Ceylin Dreibelbis B 07/20/2014, 10:55 AM

## 2014-07-20 NOTE — [facility] Suicide Risk Assessment (Signed)
[facility] INPATIENT:  Family/Significant Other Suicide Prevention Education  Suicide Prevention Education:  Education Completed; No one has been identified by the patient as the family member/significant other with whom the patient will be residing, and identified as the person(s) who will aid the patient in the event of a mental health crisis (suicidal ideations/suicide attempt).  With written consent from the patient, the family member/significant other has been provided the following suicide prevention education, prior to the and/or following the discharge of the patient.  The suicide prevention education provided includes the following:  Suicide risk factors  Suicide prevention and interventions  National Suicide Hotline telephone number  [facility] assessment telephone number  [facility] Emergency Assistance 911  [facility] and/or Residential Mobile Crisis Unit telephone number  Request made of family/significant other to:  Remove weapons (e.g., guns, rifles, knives), all items previously/currently identified as safety concern.    Remove drugs/medications (over-the-counter, prescriptions, illicit drugs), all items previously/currently identified as a safety concern.  The family member/significant other verbalizes understanding of the suicide prevention education information provided.  The family member/significant other agrees to remove the items of safety concern listed above.  The patient did not endorse SI at the time of admission, nor did the patient c/o SI during the stay here.  SPE not required. Ms Domenica Failartee, mother, [phone], and I talked again about Arlys JohnBrian and his condition.  She [facility] he is again admitted since he has been struggling so much with his agitation and disorganization.  Daryel Geraldorth, Tyrianna Lightle B 07/20/2014, 11:05 AM

## 2014-07-20 NOTE — [facility] Group Notes (Signed)
[facility] Note   07/20/2014 9:10 AM  Participation Quality:  Minimal  Mood/Affect:  Flat  Depression Rating:  denies  Anxiety Rating:  denies  Thoughts of Suicide:  No Will you contract for safety?   NA  Current AVH:  No  Plan for Discharge/Comments:  "I need anger management classes when I leave."  Cited an incident when he got upset late yesterday afternoon, but handled it "OK."  Likely d/c tomorrow.  Transportation Means: family  Supports: family  Kiribatiorth, Baldo DaubRodney B

## 2014-07-21 ENCOUNTER — Encounter (HOSPITAL_COMMUNITY): Payer: Self-pay | Admitting: Nurse Practitioner

## 2014-07-21 MED ORDER — ATORVASTATIN CALCIUM 20 MG PO TABS: 20.0000 mg | ORAL_TABLET | Freq: Every day | ORAL | Status: DC

## 2014-07-21 MED ORDER — PROPRANOLOL HCL 20 MG PO TABS: 20.0000 mg | ORAL_TABLET | Freq: Three times a day (TID) | ORAL | Status: DC

## 2014-07-21 MED ORDER — QUETIAPINE FUMARATE 300 MG PO TABS: 300.0000 mg | ORAL_TABLET | Freq: Every day | ORAL | Status: DC

## 2014-07-21 MED ORDER — BENZTROPINE MESYLATE 1 MG PO TABS: 1.0000 mg | ORAL_TABLET | ORAL | Status: DC

## 2014-07-21 MED ORDER — ARIPIPRAZOLE ER 400 MG IM SUSR: 400.0000 mg | INTRAMUSCULAR | Status: DC

## 2014-07-21 MED ORDER — ADULT MULTIVITAMIN W/MINERALS CH: 1.0000 | ORAL_TABLET | Freq: Every day | ORAL | Status: DC

## 2014-07-21 MED ORDER — FLUPHENAZINE DECANOATE 25 MG/ML IJ SOLN: 25.0000 mg | INTRAMUSCULAR | Status: DC

## 2014-07-21 MED ORDER — HYDROXYZINE HCL 25 MG PO TABS: 25.0000 mg | ORAL_TABLET | Freq: Three times a day (TID) | ORAL | Status: DC | PRN

## 2014-07-21 MED ORDER — FLUPHENAZINE HCL 5 MG PO TABS: ORAL_TABLET | ORAL | Status: DC

## 2014-07-21 MED ORDER — LAMOTRIGINE 25 MG PO TABS: ORAL_TABLET | ORAL | Status: DC

## 2014-07-21 MED ORDER — NICOTINE 21 MG/24HR TD PT24: 21.0000 mg | MEDICATED_PATCH | Freq: Every day | TRANSDERMAL | Status: DC

## 2014-07-21 NOTE — Tx Team (Signed)
  Interdisciplinary Treatment Plan Update   Date Reviewed:  07/21/2014  Time Reviewed:  11:46 AM  Progress in Treatment:   Attending groups: Yes Participating in groups: Yes Taking medication as prescribed: Yes  Tolerating medication: Yes Family/Significant other contact made: Yes  Patient understands diagnosis: Yes  Discussing patient identified problems/goals with staff: Yes  See initial care plan Medical problems stabilized or resolved: Yes Denies suicidal/homicidal ideation: Yes  In tx team Patient has not harmed self or others: Yes  For review of initial/current patient goals, please see plan of care.  Estimated Length of Stay:  D/C today  Reason for Continuation of Hospitalization:   New Problems/Goals identified:  N/A  Discharge Plan or Barriers:   return home, follow up outpt  Additional Comments:  Attendees:  Signature: Ivin BootySarama Eappen, MD 07/21/2014 11:46 AM   Signature: Richelle Itood Minsa Weddington, LCSW 07/21/2014 11:46 AM  Signature:  07/21/2014 11:46 AM  Signature: Kathi SimpersSarah Twyman, RN 07/21/2014 11:46 AM  Signature:  07/21/2014 11:46 AM  Signature:  07/21/2014 11:46 AM  Signature:   07/21/2014 11:46 AM  Signature:    Signature:    Signature:    Signature:    Signature:    Signature:      Scribe for Treatment Team:   Richelle Itood Calleen Alvis, LCSW  07/21/2014 11:46 AM

## 2014-07-21 NOTE — [facility] Group Notes (Signed)
[facility] Group Notes:  (Nursing/MHT/Case Management/Adjunct)  Date:  07/21/2014  Time:  10:49 AM  Type of Therapy:  Nurse Education  Participation Level:  Active  Participation Quality:  Appropriate  Affect:  Appropriate  Cognitive:  Appropriate  Insight:  Lacking  Engagement in Group:  Engaged  Modes of Intervention:  Discussion and Education  Summary of Progress/Problems: The purpose of this group is to discuss the topic of the day which is leisure and lifestyle changes. Staff also discuss orientation and discharge at this time. Patient was engaged in group and participated.  Marzetta BoardDopson, Reinaldo Helt E 07/21/2014, 10:49 AM

## 2014-07-21 NOTE — Progress Notes (Signed)
  [facility] :  Will you be returning to the same living situation after discharge:  Yes,  home At discharge, do you have transportation home?: Yes,  family Do you have the ability to pay for your medications: Yes,  MCD  Release of information consent forms completed and in the chart;  Patient's signature needed at discharge.  Patient to Follow up at: Follow-up Information    Follow up with [facility] On 07/25/2014.   Why:  Someone from the ACT team will come to meet with you on Monday.  Call them to confirm the time.   Contact information:   [address] [place], [place] [address] Phone: [phone] Fax: [phone]      Patient denies SI/HI: Yes,  yes    Safety Planning and Suicide Prevention discussed: Yes,  yes     Has patient been referred to the Quitline?: Patient refused referral  Ida Rogueorth, Art Levan B 07/21/2014, 8:40 AM

## 2014-07-21 NOTE — Progress Notes (Signed)
Patient ID: James GreenBrian T XXXGunter, male   DOB: [birth year], 39 y.o.   MRN: [id number]   Pt. Denies SI/HI and A/V hallucinations. Belongings returned to patient at time of discharge. Patient denies any pain or discomfort. Discharge instructions and medications were reviewed with patient. Patient verbalized understanding of both medications and discharge instructions. Patient discharged to lobby where his mother picked him up. Q15 minute safety checks maintained until discharge. No distress noted upon discharge.
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
## 2014-07-21 NOTE — [facility] Suicide Risk Assessment (Signed)
[facility]   Demographic Factors:  Male  Total Time spent with patient: 30 minutes  Musculoskeletal: Strength & Muscle Tone: within normal limits Gait & Station: normal Patient leans: N/A  Psychiatric Specialty Exam: Physical Exam  Review of Systems  Psychiatric/Behavioral: Positive for substance abuse (STABLE). Negative for depression, suicidal ideas and hallucinations. The patient is not nervous/anxious.     Blood pressure 108/64, pulse 80, temperature 97.8 F (36.6 C), temperature source Oral, resp. rate 18, height 6\' 2"  (1.88 m), weight 106.595 kg (235 lb).Body mass index is 30.16 kg/(m^2).  General Appearance: Casual  Eye Contact::  Fair  Speech:  Normal Rate409  Volume:  Normal  Mood:  Euthymic  Affect:  Appropriate  Thought Process:  Coherent  Orientation:  Full (Time, Place, and Person)  Thought Content:  WDL  Suicidal Thoughts:  No  Homicidal Thoughts:  No  Memory:  Immediate;   Fair Recent;   Fair Remote;   Fair  Judgement:  Fair  Insight:  Fair  Psychomotor Activity:  Normal  Concentration:  Fair  Recall:  FiservFair  Fund of Knowledge:Fair  Language: Fair  Akathisia:  No  Handed:  Right  AIMS (if indicated):     Assets:  Communication Skills Desire for Improvement  Sleep:  Number of Hours: 4.5  Cognition: WNL  ADL's:  Intact      Has this patient used any form of tobacco in the last 30 days? (Cigarettes, Smokeless Tobacco, Cigars, and/or Pipes) Yes, Prescription given for nicotine patch  Mental Status Per Nursing Assessment::   On Admission:     Current Mental Status by Physician: pt denies SI/HI/AH/VH  Loss Factors: Decrease in vocational status  Historical Factors: Impulsivity  Risk Reduction Factors:   Positive social support  Continued Clinical Symptoms:  Previous Psychiatric Diagnoses and Treatments  Cognitive Features That Contribute To Risk:  Polarized thinking    Suicide Risk:  Minimal: No identifiable  suicidal ideation.  Patients presenting with no risk factors but with morbid ruminations; may be classified as minimal risk based on the severity of the depressive symptoms  Principal Problem: Schizoaffective disorder, bipolar type Discharge Diagnoses:  Patient Active Problem List   Diagnosis Date Noted  . Tobacco use disorder [Z72.0]   . Schizoaffective disorder, bipolar type [F25.0] 06/29/2014    Follow-up Information    Follow up with [facility] On 07/25/2014.   Why:  Someone from the ACT team will come to meet with you on Monday.  Call them to confirm the time.   Contact information:   [address] [place], [place] [address] Phone: [phone] Fax: [phone]      Plan Of Care/Follow-up recommendations:  Activity:  NO RESTRICTIONS Diet:  REGULAR Tests:  AS NEEDED Other:  FOLLOW UP WITH AFTER CARE  Is patient on multiple antipsychotic therapies at discharge:  No   Has Patient had three or more failed trials of antipsychotic monotherapy by history:  No  Recommended Plan for Multiple Antipsychotic Therapies: NA    Audree Schrecengost md 07/21/2014, 9:11 AM

## 2014-07-25 NOTE — Progress Notes (Signed)
Patient Discharge Instructions:  After Visit Summary (AVS):   Faxed to:  07/25/14 Discharge Summary Note:   Faxed to:  07/25/14 Psychiatric Admission Assessment Note:   Faxed to:  07/25/14 Suicide Risk Assessment - Discharge Assessment:   Faxed to:  07/25/14 Faxed/Sent to the Next Level Care provider:  07/25/14 [facility] @ [phone]  Jerelene ReddenSheena E [place], 07/25/2014, 3:48 PM

## 2014-07-31 ENCOUNTER — Encounter (HOSPITAL_COMMUNITY): Payer: Self-pay | Admitting: Cardiology

## 2014-07-31 ENCOUNTER — Emergency Department (HOSPITAL_COMMUNITY)
Admission: EM | Admit: 2014-07-31 | Discharge: 2014-07-31 | Discharge status: Against Medical Advice | Payer: Medicare Other | Attending: Emergency Medicine | Admitting: Emergency Medicine

## 2014-07-31 DIAGNOSIS — Z720 Tobacco use: Secondary | ICD-10-CM | POA: Diagnosis not present

## 2014-07-31 DIAGNOSIS — R079 Chest pain, unspecified: Secondary | ICD-10-CM | POA: Diagnosis not present

## 2014-07-31 NOTE — ED Notes (Addendum)
Pt states he thinks he may have cancer.  States his chest has been hurting for a long time.  States he has lost his cigarettes 3 times.

## 2014-07-31 NOTE — ED Notes (Signed)
No answer in waiting room or outside,

## 2014-07-31 NOTE — ED Notes (Signed)
No answer in waiting room,  

## 2014-07-31 NOTE — ED Notes (Addendum)
No answer in waiting room,  

## 2014-08-01 DIAGNOSIS — F209 Schizophrenia, unspecified: Secondary | ICD-10-CM | POA: Diagnosis not present

## 2014-08-04 ENCOUNTER — Telehealth (HOSPITAL_COMMUNITY): Payer: Self-pay | Admitting: *Deleted

## 2014-08-04 NOTE — Telephone Encounter (Signed)
Prior authorization received for hydroxyzine and temazepam. Pt is not seen in the outpatient department and therefore authorization can not be done from this department. Pharmacy notified.

## 2014-08-23 ENCOUNTER — Other Ambulatory Visit (HOSPITAL_COMMUNITY): Payer: Self-pay | Admitting: Psychiatry

## 2014-09-21 DIAGNOSIS — Z91013 Allergy to seafood: Secondary | ICD-10-CM | POA: Diagnosis not present

## 2014-10-13 DIAGNOSIS — R21 Rash and other nonspecific skin eruption: Secondary | ICD-10-CM | POA: Diagnosis not present

## 2014-10-13 DIAGNOSIS — L709 Acne, unspecified: Secondary | ICD-10-CM | POA: Diagnosis not present

## 2014-11-08 ENCOUNTER — Encounter (HOSPITAL_COMMUNITY): Payer: Self-pay | Admitting: Emergency Medicine

## 2014-11-08 ENCOUNTER — Emergency Department (HOSPITAL_COMMUNITY)
Admission: EM | Admit: 2014-11-08 | Discharge: 2014-11-08 | Disposition: A | Discharge status: Home / Self Care | Payer: Medicare Other | Attending: Emergency Medicine | Admitting: Emergency Medicine

## 2014-11-08 DIAGNOSIS — Z720 Tobacco use: Secondary | ICD-10-CM | POA: Insufficient documentation

## 2014-11-08 DIAGNOSIS — E785 Hyperlipidemia, unspecified: Secondary | ICD-10-CM | POA: Diagnosis not present

## 2014-11-08 DIAGNOSIS — S60453A Superficial foreign body of left middle finger, initial encounter: Secondary | ICD-10-CM | POA: Insufficient documentation

## 2014-11-08 DIAGNOSIS — Y9389 Activity, other specified: Secondary | ICD-10-CM | POA: Diagnosis not present

## 2014-11-08 DIAGNOSIS — F200 Paranoid schizophrenia: Secondary | ICD-10-CM | POA: Insufficient documentation

## 2014-11-08 DIAGNOSIS — Z0000 Encounter for general adult medical examination without abnormal findings: Secondary | ICD-10-CM | POA: Diagnosis not present

## 2014-11-08 DIAGNOSIS — S6992XA Unspecified injury of left wrist, hand and finger(s), initial encounter: Secondary | ICD-10-CM | POA: Diagnosis present

## 2014-11-08 DIAGNOSIS — Y9289 Other specified places as the place of occurrence of the external cause: Secondary | ICD-10-CM | POA: Diagnosis not present

## 2014-11-08 DIAGNOSIS — T148XXA Other injury of unspecified body region, initial encounter: Secondary | ICD-10-CM

## 2014-11-08 DIAGNOSIS — F319 Bipolar disorder, unspecified: Secondary | ICD-10-CM | POA: Diagnosis not present

## 2014-11-08 DIAGNOSIS — Y998 Other external cause status: Secondary | ICD-10-CM | POA: Diagnosis not present

## 2014-11-08 DIAGNOSIS — Z79899 Other long term (current) drug therapy: Secondary | ICD-10-CM | POA: Diagnosis not present

## 2014-11-08 DIAGNOSIS — W458XXA Other foreign body or object entering through skin, initial encounter: Secondary | ICD-10-CM | POA: Diagnosis not present

## 2014-11-08 NOTE — ED Notes (Signed)
splinter to left middle finger.

## 2014-11-08 NOTE — Discharge Instructions (Signed)
Sliver Removal °You have had a sliver (splinter) removed. This has caused a wound that extends through some or all layers of the skin and possibly into the subcutaneous tissue. This is the tissue just beneath the skin. Because these wounds can not be cleaned well, it is necessary to watch closely for infection. °AFTER THE PROCEDURE  °If a cut (incision) was necessary to remove this, it may have been repaired for you by your caregiver either with suturing, stapling, or adhesive strips. These keep together the skin edges and allow better and faster healing. °HOME CARE INSTRUCTIONS  °· A dressing may have been applied. This may be changed once per day or as instructed. If the dressing sticks, it may be soaked off with a gauze pad or clean cloth that has been dampened with soapy water or hydrogen peroxide. °· It is difficult to remove all slivers or foreign bodies as they may break or splinter into smaller pieces. Be aware that your body will work to remove the foreign substance. That is, the foreign body may work itself out of the wound. That is normal. °· Watch for signs of infection and notify your caregiver if you suspect a sliver or foreign body remains in the wound. °· You may have received a recommendation to follow up with your physician or a specialist. It is very important to call for or keep follow-up appointments in order to avoid infection or other complications. °· Only take over-the-counter or prescription medicines for pain, discomfort, or fever as directed by your caregiver. °· If antibiotics were prescribed, be sure to finish all of the medicine. °If you did not receive a tetanus shot today because you did not recall when your last one was given, check with your caregiver in the next day or two during follow up to determine if one is needed. °SEEK MEDICAL CARE IF:  °· The area around the wound has new or worsening redness or tenderness. °· Pus is coming from the wound °· There is a foul smell from the  wound or dressing °· The edges of a wound that had been repaired break open °SEEK IMMEDIATE MEDICAL CARE IF:  °· Red streaks are coming from the wound °· An unexplained oral temperature above 102° F (38.9° C) develops. °Document Released: 04/12/2000 Document Revised: 07/08/2011 Document Reviewed: 11/30/2007 °ExitCare® Patient Information ©2015 [employer]. This information is not intended to replace advice given to you by your health care provider. Make sure you discuss any questions you have with your health care provider. ° °

## 2014-11-11 NOTE — ED Provider Notes (Signed)
CSN: [id number]     Arrival date & time 11/08/14  1734 History   First MD Initiated Contact with Patient 11/08/14 1852     Chief Complaint  Patient presents with  . Finger Injury    splinter removal     (Consider location/radiation/quality/duration/timing/severity/associated sxs/prior Treatment) HPI   MARKEVIOUS EHMKE is a 39 y.o. male who presents to the Emergency Department complaining of a splinter to his left middle finger.  He states that he has a "thorn" in his finger from picking berries.  He states that he tried to remove it by squeezing his finger but was unsuccessful.  He denies redness, swelling or numbness of his finger.    Past Medical History  Diagnosis Date  . Paranoid schizophrenia   . Hyperlipidemia   . Schizophrenia   . Bipolar disorder    Past Surgical History  Procedure Laterality Date  . Testicle surgery     Family History  Problem Relation Age of Onset  . Schizophrenia Neg Hx    History  Substance Use Topics  . Smoking status: Current Every Day Smoker -- 1.00 packs/day    Types: Cigarettes  . Smokeless tobacco: Not on file  . Alcohol Use: No    Review of Systems  Constitutional: Negative for fever.  Musculoskeletal: Negative for joint swelling.  Skin: Positive for wound. Negative for color change.  Neurological: Negative for numbness.  All other systems reviewed and are negative.     Allergies  Other  Home Medications   Prior to Admission medications   Medication Sig Start Date End Date Taking? Authorizing Provider  atorvastatin (LIPITOR) 20 MG tablet Take 1 tablet (20 mg total) by mouth daily. 07/21/14   Adonis Brook, NP  benztropine (COGENTIN) 1 MG tablet Take 1 tablet (1 mg total) by mouth 2 (two) times daily in the am and at bedtime.. 07/21/14   Adonis Brook, NP  fluPHENAZine (PROLIXIN) 5 MG tablet Take 2 tabs (10 mg) in the am.  Then taken 3 tabs (15 mg) in the evening. 07/21/14   Adonis Brook, NP  fluPHENAZine decanoate  (PROLIXIN) 25 MG/ML injection Inject 1 mL (25 mg total) into the muscle every 14 (fourteen) days. Last given on 07/20/2014.  NEXT dose due on 08/02/2014 08/02/14   Adonis Brook, NP  hydrOXYzine (ATARAX/VISTARIL) 25 MG tablet Take 1 tablet (25 mg total) by mouth 3 (three) times daily as needed for anxiety (anxiety and sleep). 07/21/14   Adonis Brook, NP  lamoTRIgine (LAMICTAL) 25 MG tablet Take 1 tab (25 mg) in the morning.  Then take 3 tabs (75 mg) in the evening. 07/21/14   Adonis Brook, NP  Multiple Vitamin (MULTIVITAMIN WITH MINERALS) TABS tablet Take 1 tablet by mouth daily. 07/21/14   Adonis Brook, NP  nicotine (NICODERM CQ - DOSED IN MG/24 HOURS) 21 mg/24hr patch Place 1 patch (21 mg total) onto the skin daily. 07/21/14   Adonis Brook, NP  propranolol (INDERAL) 20 MG tablet Take 1 tablet (20 mg total) by mouth 3 (three) times daily. 07/21/14   Adonis Brook, NP  QUEtiapine (SEROQUEL) 300 MG tablet Take 1 tablet (300 mg total) by mouth at bedtime. 07/21/14   Adonis Brook, NP   BP 130/82 mmHg  Pulse 88  Temp(Src) 98.7 F (37.1 C) (Oral)  Resp 18  Ht  (1.854 m)  Wt 235 lb (106.595 kg)  BMI 31.01 kg/m2  SpO2 98% Physical Exam  Constitutional: He is oriented to person, place, and time. He appears  well-developed and well-nourished. No distress.  HENT:  Head: Normocephalic.  Musculoskeletal: Normal range of motion. He exhibits tenderness. He exhibits no edema.  Neurological: He is alert and oriented to person, place, and time.  Skin: Skin is warm and dry.  Small FB to the distal left middle finger.  No erythema or edema.  Pt has full ROM of the finger  Psychiatric: He has a normal mood and affect.  Nursing note and vitals reviewed.   ED Course  Procedures (including critical care time) Labs Review Labs Reviewed - No data to display  Imaging Review No results found.   EKG Interpretation None      MDM   Final diagnoses:  Splinter in skin   Procedure: Finger cleaned  with alcohol pad FB removed completely using a 22g needle  No bleeding FB removed completely Pt tolerated procedure well    Small splinter in the skin.  Removed completely by me , remains NV intact.        Pauline Ausammy Sharron Simpson, PA-C 11/11/14 1517  Donnetta HutchingBrian Cook, MD 11/11/14 [phone]

## 2014-11-14 ENCOUNTER — Emergency Department (HOSPITAL_COMMUNITY): Payer: Medicare Other

## 2014-11-14 ENCOUNTER — Emergency Department (HOSPITAL_COMMUNITY)
Admission: EM | Admit: 2014-11-14 | Discharge: 2014-11-14 | Disposition: A | Discharge status: Home / Self Care | Payer: Medicare Other | Attending: Emergency Medicine | Admitting: Emergency Medicine

## 2014-11-14 ENCOUNTER — Encounter (HOSPITAL_COMMUNITY): Payer: Self-pay | Admitting: *Deleted

## 2014-11-14 DIAGNOSIS — Z720 Tobacco use: Secondary | ICD-10-CM | POA: Insufficient documentation

## 2014-11-14 DIAGNOSIS — Z79899 Other long term (current) drug therapy: Secondary | ICD-10-CM | POA: Insufficient documentation

## 2014-11-14 DIAGNOSIS — R079 Chest pain, unspecified: Secondary | ICD-10-CM

## 2014-11-14 DIAGNOSIS — F200 Paranoid schizophrenia: Secondary | ICD-10-CM | POA: Diagnosis not present

## 2014-11-14 DIAGNOSIS — F319 Bipolar disorder, unspecified: Secondary | ICD-10-CM | POA: Diagnosis not present

## 2014-11-14 DIAGNOSIS — E785 Hyperlipidemia, unspecified: Secondary | ICD-10-CM | POA: Insufficient documentation

## 2014-11-14 DIAGNOSIS — R0789 Other chest pain: Secondary | ICD-10-CM | POA: Diagnosis not present

## 2014-11-14 DIAGNOSIS — R0602 Shortness of breath: Secondary | ICD-10-CM | POA: Diagnosis not present

## 2014-11-14 LAB — COMPREHENSIVE METABOLIC PANEL
ALBUMIN: 4.4 g/dL (ref 3.5–5.0)
ALK PHOS: 73 U/L (ref 38–126)
ALT: 26 U/L (ref 17–63)
AST: 34 U/L (ref 15–41)
Anion gap: 11 (ref 5–15)
BILIRUBIN TOTAL: 0.4 mg/dL (ref 0.3–1.2)
BUN: 18 mg/dL (ref 6–20)
CALCIUM: 8.7 mg/dL — AB (ref 8.9–10.3)
CO2: 30 mmol/L (ref 22–32)
Chloride: 94 mmol/L — ABNORMAL LOW (ref 101–111)
Creatinine, Ser: 1.2 mg/dL (ref 0.61–1.24)
GFR calc Af Amer: >60 mL/min (ref >60)
GFR calc non Af Amer: >60 mL/min (ref >60)
GLUCOSE: 111 mg/dL — AB (ref 65–99)
Potassium: 3.4 mmol/L — ABNORMAL LOW (ref 3.5–5.1)
SODIUM: 135 mmol/L (ref 135–145)
Total Protein: 7.3 g/dL (ref 6.5–8.1)

## 2014-11-14 LAB — CBC WITH DIFFERENTIAL/PLATELET
Basophils Absolute: 0.1 10*3/uL (ref 0.0–0.1)
Basophils Relative: 1 % (ref 0–1)
EOS PCT: 3 % (ref 0–5)
Eosinophils Absolute: 0.3 10*3/uL (ref 0.0–0.7)
HCT: 43.9 % (ref 39.0–52.0)
Hemoglobin: 15.8 g/dL (ref 13.0–17.0)
LYMPHS ABS: 3.2 10*3/uL (ref 0.7–4.0)
Lymphocytes Relative: 34 % (ref 12–46)
MCH: 30.4 pg (ref 26.0–34.0)
MCHC: 36 g/dL (ref 30.0–36.0)
MCV: 84.4 fL (ref 78.0–100.0)
MONO ABS: 0.5 10*3/uL (ref 0.1–1.0)
Monocytes Relative: 5 % (ref 3–12)
NEUTROS ABS: 5.6 10*3/uL (ref 1.7–7.7)
Neutrophils Relative %: 57 % (ref 43–77)
Platelets: 306 10*3/uL (ref 150–400)
RBC: 5.2 MIL/uL (ref 4.22–5.81)
RDW: 12.3 % (ref 11.5–15.5)
WBC: 9.6 10*3/uL (ref 4.0–10.5)

## 2014-11-14 LAB — TROPONIN I: Troponin I: <0.03 ng/mL (ref <0.031)

## 2014-11-14 NOTE — Discharge Instructions (Signed)
Chest Pain (Nonspecific) °It is often hard to give a specific diagnosis for the cause of chest pain. There is always a chance that your pain could be related to something serious, such as a heart attack or a blood clot in the lungs. You need to follow up with your health care provider for further evaluation. °CAUSES  °· Heartburn. °· Pneumonia or bronchitis. °· Anxiety or stress. °· Inflammation around your heart (pericarditis) or lung (pleuritis or pleurisy). °· A blood clot in the lung. °· A collapsed lung (pneumothorax). It can develop suddenly on its own (spontaneous pneumothorax) or from trauma to the chest. °· Shingles infection (herpes zoster virus). °The chest wall is composed of bones, muscles, and cartilage. Any of these can be the source of the pain. °· The bones can be bruised by injury. °· The muscles or cartilage can be strained by coughing or overwork. °· The cartilage can be affected by inflammation and become sore (costochondritis). °DIAGNOSIS  °Lab tests or other studies may be needed to find the cause of your pain. Your health care provider may have you take a test called an ambulatory electrocardiogram (ECG). An ECG records your heartbeat patterns over a 24-hour period. You may also have other tests, such as: °· Transthoracic echocardiogram (TTE). During echocardiography, sound waves are used to evaluate how blood flows through your heart. °· Transesophageal echocardiogram (TEE). °· Cardiac monitoring. This allows your health care provider to monitor your heart rate and rhythm in real time. °· Holter monitor. This is a portable device that records your heartbeat and can help diagnose heart arrhythmias. It allows your health care provider to track your heart activity for several days, if needed. °· Stress tests by exercise or by giving medicine that makes the heart beat faster. °TREATMENT  °· Treatment depends on what may be causing your chest pain. Treatment may include: °¨ Acid blockers for  heartburn. °¨ Anti-inflammatory medicine. °¨ Pain medicine for inflammatory conditions. °¨ Antibiotics if an infection is present. °· You may be advised to change lifestyle habits. This includes stopping smoking and avoiding alcohol, caffeine, and chocolate. °· You may be advised to keep your head raised (elevated) when sleeping. This reduces the chance of acid going backward from your stomach into your esophagus. °Most of the time, nonspecific chest pain will improve within 2-3 days with rest and mild pain medicine.  °HOME CARE INSTRUCTIONS  °· If antibiotics were prescribed, take them as directed. Finish them even if you start to feel better. °· For the next few days, avoid physical activities that bring on chest pain. Continue physical activities as directed. °· Do not use any tobacco products, including cigarettes, chewing tobacco, or electronic cigarettes. °· Avoid drinking alcohol. °· Only take medicine as directed by your health care provider. °· Follow your health care provider's suggestions for further testing if your chest pain does not go away. °· Keep any follow-up appointments you made. If you do not go to an appointment, you could develop lasting (chronic) problems with pain. If there is any problem keeping an appointment, call to reschedule. °SEEK MEDICAL CARE IF:  °· Your chest pain does not go away, even after treatment. °· You have a rash with blisters on your chest. °· You have a fever. °SEEK IMMEDIATE MEDICAL CARE IF:  °· You have increased chest pain or pain that spreads to your arm, neck, jaw, back, or abdomen. °· You have shortness of breath. °· You have an increasing cough, or you cough   up blood.  You have severe back or abdominal pain.  You feel nauseous or vomit.  You have severe weakness.  You faint.  You have chills. This is an emergency. Do not wait to see if the pain will go away. Get medical help at once. Call your local emergency services (911 in [place] Do not drive  yourself to the hospital. MAKE SURE YOU:   Understand these instructions.  Will watch your condition.  Will get help right away if you are not doing well or get worse. Document Released: 01/23/2005 Document Revised: 04/20/2013 Document Reviewed: 11/19/2007 [facility] Patient Information 2015 [place], [place]. This information is not intended to replace advice given to you by your health care provider. Make sure you discuss any questions you have with your health care provider.  Chest x-ray was negative cardiac workup was negative make an appointment to follow-up with your regular doctor. Return for any new or worse symptoms.

## 2014-11-14 NOTE — ED Provider Notes (Signed)
CSN: [id number]     Arrival date & time 11/14/14  2841 History   First MD Initiated Contact with Patient 11/14/14 [phone]     Chief Complaint  Patient presents with  . Chest Pain     (Consider location/radiation/quality/duration/timing/severity/associated sxs/prior Treatment) Patient is a 39 y.o. male presenting with chest pain. The history is provided by the patient.  Chest Pain He states that he gets chest pain whenever he smokes one or 2 packs of cigarettes a day and that he had smoked 2 packs of cigarettes since yesterday and had developed chest pain. Pain is not present currently. Pain is across the anterior chest and is sharp. He reported to assign a number to the pain. Nothing makes it better nothing makes it worse except for smoking. There is no associated dyspnea, nausea, diaphoresis. On further questioning, the patient states that he came to be screened to make sure that he does not have cancer although he states that even if he had cancer he would not have any surgery. He does have a history of schizophrenia and states he is compliant with his medications. There is no history of diabetes, hypertension, hyperlipidemia and there is no family history of premature coronary atherosclerosis.  Past Medical History  Diagnosis Date  . Paranoid schizophrenia   . Hyperlipidemia   . Schizophrenia   . Bipolar disorder    Past Surgical History  Procedure Laterality Date  . Testicle surgery     Family History  Problem Relation Age of Onset  . Schizophrenia Neg Hx    History  Substance Use Topics  . Smoking status: Current Every Day Smoker -- 1.00 packs/day    Types: Cigarettes  . Smokeless tobacco: Not on file  . Alcohol Use: No    Review of Systems  Cardiovascular: Positive for chest pain.  All other systems reviewed and are negative.     Allergies  Other  Home Medications   Prior to Admission medications   Medication Sig Start Date End Date Taking? Authorizing Provider   atorvastatin (LIPITOR) 20 MG tablet Take 1 tablet (20 mg total) by mouth daily. 07/21/14   Adonis Brook, NP  benztropine (COGENTIN) 1 MG tablet Take 1 tablet (1 mg total) by mouth 2 (two) times daily in the am and at bedtime.. 07/21/14   Adonis Brook, NP  fluPHENAZine (PROLIXIN) 5 MG tablet Take 2 tabs (10 mg) in the am.  Then taken 3 tabs (15 mg) in the evening. 07/21/14   Adonis Brook, NP  fluPHENAZine decanoate (PROLIXIN) 25 MG/ML injection Inject 1 mL (25 mg total) into the muscle every 14 (fourteen) days. Last given on 07/20/2014.  NEXT dose due on 08/02/2014 08/02/14   Adonis Brook, NP  hydrOXYzine (ATARAX/VISTARIL) 25 MG tablet Take 1 tablet (25 mg total) by mouth 3 (three) times daily as needed for anxiety (anxiety and sleep). 07/21/14   Adonis Brook, NP  lamoTRIgine (LAMICTAL) 25 MG tablet Take 1 tab (25 mg) in the morning.  Then take 3 tabs (75 mg) in the evening. 07/21/14   Adonis Brook, NP  Multiple Vitamin (MULTIVITAMIN WITH MINERALS) TABS tablet Take 1 tablet by mouth daily. 07/21/14   Adonis Brook, NP  nicotine (NICODERM CQ - DOSED IN MG/24 HOURS) 21 mg/24hr patch Place 1 patch (21 mg total) onto the skin daily. 07/21/14   Adonis Brook, NP  propranolol (INDERAL) 20 MG tablet Take 1 tablet (20 mg total) by mouth 3 (three) times daily. 07/21/14   Adonis Brook, NP  QUEtiapine (SEROQUEL) 300 MG tablet Take 1 tablet (300 mg total) by mouth at bedtime. 07/21/14   Adonis BrookSheila Agustin, NP   BP 133/93 mmHg  Pulse 94  Temp(Src) 98.8 F (37.1 C) (Oral)  Resp 20  Ht 6\' 1"  (1.854 m)  Wt 235 lb (106.595 kg)  BMI 31.01 kg/m2  SpO2 99% Physical Exam  Nursing note and vitals reviewed.  39 year old male, resting comfortably and in no acute distress. Vital signs are significant for borderline hypertension. Oxygen saturation is 99%, which is normal. Head is normocephalic and atraumatic. PERRLA, EOMI. Oropharynx is clear. Neck is nontender and supple without adenopathy or JVD. Back is nontender  and there is no CVA tenderness. Lungs are clear without rales, wheezes, or rhonchi. Chest is nontender. Heart has regular rate and rhythm without murmur. Abdomen is soft, flat, nontender without masses or hepatosplenomegaly and peristalsis is normoactive. Extremities have no cyanosis or edema, full range of motion is present. Skin is warm and dry without rash. Neurologic: Mental status is normal, cranial nerves are intact, there are no motor or sensory deficits.  ED Course  Procedures (including critical care time)  Imaging Review Dg Chest 2 View  11/14/2014   CLINICAL DATA:  Shortness of breath, midsternal chest pain. History of tobacco use.  EXAM: CHEST  2 VIEW  COMPARISON:  Chest radiograph January 08, 2008  FINDINGS: Cardiomediastinal silhouette is unremarkable. The lungs are clear without pleural effusions or focal consolidations. Trachea projects midline and there is no pneumothorax. Soft tissue planes and included osseous structures are non-suspicious.  IMPRESSION: Normal chest.   Electronically Signed   By: Awilda Metroourtnay  Bloomer M.D.   On: 11/14/2014 06:53   Images viewed by me.   EKG Interpretation   Date/Time:  Monday November 14 2014 06:29:39 EDT Ventricular Rate:  77 PR Interval:  136 QRS Duration: 96 QT Interval:  375 QTC Calculation: 424 R Axis:   67 Text Interpretation:  Sinus rhythm Abnormal R-wave progression, early  transition Early repolarization When compared with ECG of 07/31/2014, No  significant change was found Confirmed by [facility]GLICK  MD, Conya Ellinwood ([id number]) on  11/14/2014 6:45:20 AM      MDM   Final diagnoses:  Chest pain, unspecified chest pain type    Atypical chest pain related to smoking. Chest x-ray and metabolic panel will be obtained to screen for malignancy. Review of old records confirms history of schizophrenia and no prior evaluation for chest pain.  Case is signed out to Dr. Deretha EmoryZackowski.  Dione Boozeavid Tamber Burtch, MD 11/14/14 [phone]

## 2014-11-14 NOTE — ED Notes (Signed)
Pt c/o chest pain and states when he smokes a lot he starts to have more chest pain

## 2014-11-14 NOTE — ED Provider Notes (Signed)
Results for orders placed or performed during the hospital encounter of 11/14/14  Comprehensive metabolic panel  Result Value Ref Range   Sodium 135 135 - 145 mmol/L   Potassium 3.4 (L) 3.5 - 5.1 mmol/L   Chloride 94 (L) 101 - 111 mmol/L   CO2 30 22 - 32 mmol/L   Glucose, Bld 111 (H) 65 - 99 mg/dL   BUN 18 6 - 20 mg/dL   Creatinine, Ser 1.611.20 0.61 - 1.24 mg/dL   Calcium 8.7 (L) 8.9 - 10.3 mg/dL   Total Protein 7.3 6.5 - 8.1 g/dL   Albumin 4.4 3.5 - 5.0 g/dL   AST 34 15 - 41 U/L   ALT 26 17 - 63 U/L   Alkaline Phosphatase 73 38 - 126 U/L   Total Bilirubin 0.4 0.3 - 1.2 mg/dL   GFR calc non Af Amer >60 >60 mL/min   GFR calc Af Amer >60 >60 mL/min   Anion gap 11 5 - 15  Troponin I  Result Value Ref Range   Troponin I <0.03 <0.031 ng/mL  CBC with Differential  Result Value Ref Range   WBC 9.6 4.0 - 10.5 K/uL   RBC 5.20 4.22 - 5.81 MIL/uL   Hemoglobin 15.8 13.0 - 17.0 g/dL   HCT 09.643.9 04.539.0 - 40.952.0 %   MCV 84.4 78.0 - 100.0 fL   MCH 30.4 26.0 - 34.0 pg   MCHC 36.0 30.0 - 36.0 g/dL   RDW 81.112.3 91.411.5 - 78.215.5 %   Platelets 306 150 - 400 K/uL   Neutrophils Relative % 57 43 - 77 %   Neutro Abs 5.6 1.7 - 7.7 K/uL   Lymphocytes Relative 34 12 - 46 %   Lymphs Abs 3.2 0.7 - 4.0 K/uL   Monocytes Relative 5 3 - 12 %   Monocytes Absolute 0.5 0.1 - 1.0 K/uL   Eosinophils Relative 3 0 - 5 %   Eosinophils Absolute 0.3 0.0 - 0.7 K/uL   Basophils Relative 1 0 - 1 %   Basophils Absolute 0.1 0.0 - 0.1 K/uL   Dg Chest 2 View  11/14/2014   CLINICAL DATA:  Shortness of breath, midsternal chest pain. History of tobacco use.  EXAM: CHEST  2 VIEW  COMPARISON:  Chest radiograph January 08, 2008  FINDINGS: Cardiomediastinal silhouette is unremarkable. The lungs are clear without pleural effusions or focal consolidations. Trachea projects midline and there is no pneumothorax. Soft tissue planes and included osseous structures are non-suspicious.  IMPRESSION: Normal chest.   Electronically Signed   By:  Awilda Metroourtnay  Bloomer M.D.   On: 11/14/2014 06:53    Patient's labs without any significant findings. Chest x-ray without any acute pulmonary findings. Patient's troponin is negative. Patient stable for discharge home and follow-up with primary care doctor.  Vanetta MuldersScott Rosalva Neary, MD 11/14/14 [phone]

## 2014-11-28 ENCOUNTER — Emergency Department (HOSPITAL_COMMUNITY)
Admission: EM | Admit: 2014-11-28 | Discharge: 2014-11-28 | Disposition: A | Discharge status: Home / Self Care | Payer: Medicare Other | Attending: Emergency Medicine | Admitting: Emergency Medicine

## 2014-11-28 ENCOUNTER — Encounter (HOSPITAL_COMMUNITY): Payer: Self-pay | Admitting: Emergency Medicine

## 2014-11-28 DIAGNOSIS — Y9289 Other specified places as the place of occurrence of the external cause: Secondary | ICD-10-CM | POA: Insufficient documentation

## 2014-11-28 DIAGNOSIS — F319 Bipolar disorder, unspecified: Secondary | ICD-10-CM | POA: Diagnosis not present

## 2014-11-28 DIAGNOSIS — Y9389 Activity, other specified: Secondary | ICD-10-CM | POA: Diagnosis not present

## 2014-11-28 DIAGNOSIS — Y998 Other external cause status: Secondary | ICD-10-CM | POA: Insufficient documentation

## 2014-11-28 DIAGNOSIS — F200 Paranoid schizophrenia: Secondary | ICD-10-CM | POA: Diagnosis not present

## 2014-11-28 DIAGNOSIS — S76212A Strain of adductor muscle, fascia and tendon of left thigh, initial encounter: Secondary | ICD-10-CM | POA: Insufficient documentation

## 2014-11-28 DIAGNOSIS — S39013A Strain of muscle, fascia and tendon of pelvis, initial encounter: Secondary | ICD-10-CM

## 2014-11-28 DIAGNOSIS — Z79899 Other long term (current) drug therapy: Secondary | ICD-10-CM | POA: Diagnosis not present

## 2014-11-28 DIAGNOSIS — E785 Hyperlipidemia, unspecified: Secondary | ICD-10-CM | POA: Insufficient documentation

## 2014-11-28 DIAGNOSIS — Z720 Tobacco use: Secondary | ICD-10-CM | POA: Insufficient documentation

## 2014-11-28 DIAGNOSIS — X58XXXA Exposure to other specified factors, initial encounter: Secondary | ICD-10-CM | POA: Insufficient documentation

## 2014-11-28 DIAGNOSIS — R1030 Lower abdominal pain, unspecified: Secondary | ICD-10-CM | POA: Diagnosis present

## 2014-11-28 DIAGNOSIS — S39011A Strain of muscle, fascia and tendon of abdomen, initial encounter: Secondary | ICD-10-CM | POA: Diagnosis not present

## 2014-11-28 MED ORDER — CYCLOBENZAPRINE HCL 10 MG PO TABS: 10.0000 mg | ORAL_TABLET | Freq: Two times a day (BID) | ORAL | Status: DC | PRN

## 2014-11-28 MED ORDER — HYDROCODONE-ACETAMINOPHEN 5-325 MG PO TABS
1.0000 | ORAL_TABLET | Freq: Once | ORAL | Status: AC
  Administered 2014-11-28: 1 via ORAL
  Filled 2014-11-28: qty 1

## 2014-11-28 MED ORDER — HYDROCODONE-ACETAMINOPHEN 5-325 MG PO TABS: 1.0000 | ORAL_TABLET | ORAL | Status: DC | PRN

## 2014-11-28 MED ORDER — CYCLOBENZAPRINE HCL 10 MG PO TABS
10.0000 mg | ORAL_TABLET | Freq: Once | ORAL | Status: AC
Start: 2014-11-28 — End: 2014-11-28
  Administered 2014-11-28: 10 mg via ORAL
  Filled 2014-11-28: qty 1

## 2014-11-28 NOTE — ED Notes (Signed)
Patient complaining of left groin pain. States pain started after walking a lot outside and doing a "defensive slide" across the road. Patient states, "I think I pulled a muscle." Patient ambulatory in triage.

## 2014-11-28 NOTE — Discharge Instructions (Signed)
Follow up with your doctor.  Return here as needed 

## 2014-11-28 NOTE — ED Provider Notes (Signed)
CSN: [id number]     Arrival date & time 11/28/14  2014 History   First MD Initiated Contact with Patient 11/28/14 2039     Chief Complaint  Patient presents with  . Groin Pain     (Consider location/radiation/quality/duration/timing/severity/associated sxs/prior Treatment) Patient is a 39 y.o. male presenting with groin pain. The history is provided by the patient. No language interpreter was used.  Groin Pain This is a new problem. The current episode started in the past 7 days. The problem occurs constantly. The problem has been gradually worsening. The symptoms are aggravated by walking (change in position). He has tried NSAIDs for the symptoms. The treatment provided mild relief.   [name] is a 39 y.o. male who presents to the ED with left groin pain that started after he was doing a "defensive slide", (like when playing basketball) across the road. Patient thinks he may have pulled a muscle. He reports doing something similar when playing basketball in high school. He has been taking ibuprofen with very little relief. He denies any UTI symptoms or other problems.   Past Medical History  Diagnosis Date  . Paranoid schizophrenia   . Hyperlipidemia   . Schizophrenia   . Bipolar disorder    Past Surgical History  Procedure Laterality Date  . Testicle surgery     Family History  Problem Relation Age of Onset  . Schizophrenia Neg Hx    History  Substance Use Topics  . Smoking status: Current Every Day Smoker -- 1.00 packs/day    Types: Cigarettes  . Smokeless tobacco: Not on file  . Alcohol Use: No    Review of Systems Negative except as stated in HPI   Allergies  Other  Home Medications   Prior to Admission medications   Medication Sig Start Date End Date Taking? Authorizing Provider  atorvastatin (LIPITOR) 20 MG tablet Take 1 tablet (20 mg total) by mouth daily. 07/21/14   Adonis Brook, NP  benztropine (COGENTIN) 1 MG tablet Take 1 tablet (1 mg total) by  mouth 2 (two) times daily in the am and at bedtime.. 07/21/14   Adonis Brook, NP  cyclobenzaprine (FLEXERIL) 10 MG tablet Take 1 tablet (10 mg total) by mouth 2 (two) times daily as needed for muscle spasms. 11/28/14   Hope Orlene Och, NP  fluPHENAZine (PROLIXIN) 5 MG tablet Take 2 tabs (10 mg) in the am.  Then taken 3 tabs (15 mg) in the evening. 07/21/14   Adonis Brook, NP  fluPHENAZine decanoate (PROLIXIN) 25 MG/ML injection Inject 1 mL (25 mg total) into the muscle every 14 (fourteen) days. Last given on 07/20/2014.  NEXT dose due on 08/02/2014 08/02/14   Adonis Brook, NP  HYDROcodone-acetaminophen (NORCO/VICODIN) 5-325 MG per tablet Take 1 tablet by mouth every 4 (four) hours as needed. 11/28/14   Hope Orlene Och, NP  hydrOXYzine (ATARAX/VISTARIL) 25 MG tablet Take 1 tablet (25 mg total) by mouth 3 (three) times daily as needed for anxiety (anxiety and sleep). 07/21/14   Adonis Brook, NP  lamoTRIgine (LAMICTAL) 25 MG tablet Take 1 tab (25 mg) in the morning.  Then take 3 tabs (75 mg) in the evening. 07/21/14   Adonis Brook, NP  Multiple Vitamin (MULTIVITAMIN WITH MINERALS) TABS tablet Take 1 tablet by mouth daily. 07/21/14   Adonis Brook, NP  nicotine (NICODERM CQ - DOSED IN MG/24 HOURS) 21 mg/24hr patch Place 1 patch (21 mg total) onto the skin daily. 07/21/14   Adonis Brook, NP  propranolol (  INDERAL) 20 MG tablet Take 1 tablet (20 mg total) by mouth 3 (three) times daily. 07/21/14   Adonis Brook, NP  QUEtiapine (SEROQUEL) 300 MG tablet Take 1 tablet (300 mg total) by mouth at bedtime. 07/21/14   Adonis Brook, NP   BP 111/68 mmHg  Pulse 81  Temp(Src) 98.1 F (36.7 C) (Oral)  Resp 18  Ht  (1.854 m)  Wt 235 lb (106.595 kg)  BMI 31.01 kg/m2  SpO2 100% Physical Exam  Constitutional: He is oriented to person, place, and time. He appears well-developed and well-nourished.  HENT:  Head: Normocephalic.  Eyes: EOM are normal.  Neck: Normal range of motion. Neck supple.  Cardiovascular: Normal  rate.   Pulmonary/Chest: Effort normal.  Abdominal: Soft. There is no tenderness.  Genitourinary:    Right testis shows no tenderness. Left testis shows no mass, no swelling and no tenderness.  Right testicle firm due to implant after injury as a child. Tenderness of left inguinal area without swelling or signs of hernia.  Pain increases with ambulation.   Musculoskeletal: Normal range of motion.  Neurological: He is alert and oriented to person, place, and time. No cranial nerve deficit.  Skin: Skin is warm and dry.  Psychiatric: He has a normal mood and affect. His behavior is normal.  Nursing note and vitals reviewed.   ED Course  Procedures   MDM  39 y.o. male with left inguinal pain x 1 week s/p injury. Will treat with muscle strain and he will follow up with his PCP later in the week or return here as needed for problems.  Discussed with the patient and all questioned fully answered. He voices understanding and agrees with plan.    Final diagnoses:  Strain of left inguinal muscle, initial encounter      [facility], NP 11/28/14 2112  Mancel Bale, MD 11/28/14 [phone]

## 2014-11-28 NOTE — ED Notes (Signed)
Pt given discharge instructions - Verbalized understanding about pain meds and muscle relaxer- Provided with ice pack to apply to groin when he gets home as per NP verbal instructions .

## 2014-12-02 ENCOUNTER — Encounter (HOSPITAL_COMMUNITY): Payer: Self-pay | Admitting: Emergency Medicine

## 2014-12-02 ENCOUNTER — Emergency Department (HOSPITAL_COMMUNITY)
Admission: EM | Admit: 2014-12-02 | Discharge: 2014-12-02 | Disposition: A | Discharge status: Home / Self Care | Payer: Medicare Other | Attending: Emergency Medicine | Admitting: Emergency Medicine

## 2014-12-02 ENCOUNTER — Emergency Department (HOSPITAL_COMMUNITY): Payer: Medicare Other

## 2014-12-02 DIAGNOSIS — Y998 Other external cause status: Secondary | ICD-10-CM | POA: Diagnosis not present

## 2014-12-02 DIAGNOSIS — E785 Hyperlipidemia, unspecified: Secondary | ICD-10-CM | POA: Insufficient documentation

## 2014-12-02 DIAGNOSIS — Z720 Tobacco use: Secondary | ICD-10-CM | POA: Diagnosis not present

## 2014-12-02 DIAGNOSIS — Y9289 Other specified places as the place of occurrence of the external cause: Secondary | ICD-10-CM | POA: Diagnosis not present

## 2014-12-02 DIAGNOSIS — X58XXXA Exposure to other specified factors, initial encounter: Secondary | ICD-10-CM | POA: Diagnosis not present

## 2014-12-02 DIAGNOSIS — F319 Bipolar disorder, unspecified: Secondary | ICD-10-CM | POA: Diagnosis not present

## 2014-12-02 DIAGNOSIS — S99921A Unspecified injury of right foot, initial encounter: Secondary | ICD-10-CM | POA: Diagnosis present

## 2014-12-02 DIAGNOSIS — Y9389 Activity, other specified: Secondary | ICD-10-CM | POA: Diagnosis not present

## 2014-12-02 DIAGNOSIS — Z79899 Other long term (current) drug therapy: Secondary | ICD-10-CM | POA: Insufficient documentation

## 2014-12-02 DIAGNOSIS — S93601A Unspecified sprain of right foot, initial encounter: Secondary | ICD-10-CM | POA: Insufficient documentation

## 2014-12-02 DIAGNOSIS — M79671 Pain in right foot: Secondary | ICD-10-CM | POA: Diagnosis not present

## 2014-12-02 DIAGNOSIS — F209 Schizophrenia, unspecified: Secondary | ICD-10-CM | POA: Diagnosis not present

## 2014-12-02 NOTE — Discharge Instructions (Signed)
Foot Sprain The muscles and cord like structures which attach muscle to bone (tendons) that surround the feet are made up of units. A foot sprain can occur at the weakest spot in any of these units. This condition is most often caused by injury to or overuse of the foot, as from playing contact sports, or aggravating a previous injury, or from poor conditioning, or obesity. SYMPTOMS  Pain with movement of the foot.  Tenderness and swelling at the injury site.  Loss of strength is present in moderate or severe sprains. THE THREE GRADES OR SEVERITY OF FOOT SPRAIN ARE:  Mild (Grade I): Slightly pulled muscle without tearing of muscle or tendon fibers or loss of strength.  Moderate (Grade II): Tearing of fibers in a muscle, tendon, or at the attachment to bone, with small decrease in strength.  Severe (Grade III): Rupture of the muscle-tendon-bone attachment, with separation of fibers. Severe sprain requires surgical repair. Often repeating (chronic) sprains are caused by overuse. Sudden (acute) sprains are caused by direct injury or over-use. DIAGNOSIS  Diagnosis of this condition is usually by your own observation. If problems continue, a caregiver may be required for further evaluation and treatment. X-rays may be required to make sure there are not breaks in the bones (fractures) present. Continued problems may require physical therapy for treatment. PREVENTION  Use strength and conditioning exercises appropriate for your sport.  Warm up properly prior to working out.  Use athletic shoes that are made for the sport you are participating in.  Allow adequate time for healing. Early return to activities makes repeat injury more likely, and can lead to an unstable arthritic foot that can result in prolonged disability. Mild sprains generally heal in 3 to 10 days, with moderate and severe sprains taking 2 to 10 weeks. Your caregiver can help you determine the proper time required for  healing. HOME CARE INSTRUCTIONS   Apply ice to the injury for 15-20 minutes, 03-04 times per day. Put the ice in a plastic bag and place a towel between the bag of ice and your skin.  An elastic wrap (like an Ace bandage) may be used to keep swelling down.  Keep foot above the level of the heart, or at least raised on a footstool, when swelling and pain are present.  Try to avoid use other than gentle range of motion while the foot is painful. Do not resume use until instructed by your caregiver. Then begin use gradually, not increasing use to the point of pain. If pain does develop, decrease use and continue the above measures, gradually increasing activities that do not cause discomfort, until you gradually achieve normal use.  Use crutches if and as instructed, and for the length of time instructed.  Keep injured foot and ankle wrapped between treatments.  Massage foot and ankle for comfort and to keep swelling down. Massage from the toes up towards the knee.  Only take over-the-counter or prescription medicines for pain, discomfort, or fever as directed by your caregiver. SEEK IMMEDIATE MEDICAL CARE IF:   Your pain and swelling increase, or pain is not controlled with medications.  You have loss of feeling in your foot or your foot turns cold or blue.  You develop new, unexplained symptoms, or an increase of the symptoms that brought you to your caregiver. MAKE SURE YOU:   Understand these instructions.  Will watch your condition.  Will get help right away if you are not doing well or get worse. Document Released:   10/05/2001 Document Revised: 07/08/2011 Document Reviewed: 12/03/2007 [employer] Patient Information 2015 [employer]. This information is not intended to replace advice given to you by your health care provider. Make sure you discuss any questions you have with your health care provider.  

## 2014-12-02 NOTE — ED Notes (Signed)
Pt states that he was stepping off a pavement and twisted his foot -has been having pain and swelling

## 2014-12-03 ENCOUNTER — Encounter (HOSPITAL_COMMUNITY): Payer: Self-pay | Admitting: Emergency Medicine

## 2014-12-03 ENCOUNTER — Emergency Department (HOSPITAL_COMMUNITY): Payer: Medicare Other

## 2014-12-03 ENCOUNTER — Emergency Department (HOSPITAL_COMMUNITY)
Admission: EM | Admit: 2014-12-03 | Discharge: 2014-12-03 | Disposition: A | Discharge status: Home / Self Care | Payer: Medicare Other | Attending: Emergency Medicine | Admitting: Emergency Medicine

## 2014-12-03 DIAGNOSIS — Z79899 Other long term (current) drug therapy: Secondary | ICD-10-CM | POA: Diagnosis not present

## 2014-12-03 DIAGNOSIS — R52 Pain, unspecified: Secondary | ICD-10-CM | POA: Diagnosis not present

## 2014-12-03 DIAGNOSIS — Y9341 Activity, dancing: Secondary | ICD-10-CM | POA: Diagnosis not present

## 2014-12-03 DIAGNOSIS — Y92009 Unspecified place in unspecified non-institutional (private) residence as the place of occurrence of the external cause: Secondary | ICD-10-CM | POA: Insufficient documentation

## 2014-12-03 DIAGNOSIS — M79671 Pain in right foot: Secondary | ICD-10-CM | POA: Diagnosis not present

## 2014-12-03 DIAGNOSIS — F200 Paranoid schizophrenia: Secondary | ICD-10-CM | POA: Diagnosis not present

## 2014-12-03 DIAGNOSIS — Z720 Tobacco use: Secondary | ICD-10-CM | POA: Insufficient documentation

## 2014-12-03 DIAGNOSIS — E785 Hyperlipidemia, unspecified: Secondary | ICD-10-CM | POA: Diagnosis not present

## 2014-12-03 DIAGNOSIS — S99921A Unspecified injury of right foot, initial encounter: Secondary | ICD-10-CM | POA: Diagnosis not present

## 2014-12-03 DIAGNOSIS — Y998 Other external cause status: Secondary | ICD-10-CM | POA: Insufficient documentation

## 2014-12-03 DIAGNOSIS — F319 Bipolar disorder, unspecified: Secondary | ICD-10-CM | POA: Diagnosis not present

## 2014-12-03 DIAGNOSIS — X58XXXA Exposure to other specified factors, initial encounter: Secondary | ICD-10-CM | POA: Diagnosis not present

## 2014-12-03 DIAGNOSIS — S93601D Unspecified sprain of right foot, subsequent encounter: Secondary | ICD-10-CM | POA: Diagnosis not present

## 2014-12-03 MED ORDER — NAPROXEN 500 MG PO TABS: 500.0000 mg | ORAL_TABLET | Freq: Two times a day (BID) | ORAL | Status: DC

## 2014-12-03 NOTE — ED Provider Notes (Signed)
CSN: [id number]     Arrival date & time 12/03/14  1616 History   First MD Initiated Contact with Patient 12/03/14 1629     Chief Complaint  Patient presents with  . Foot Injury     (Consider location/radiation/quality/duration/timing/severity/associated sxs/prior Treatment) The history is provided by the patient.   [name] is a 40 y.o. male presenting with worsened right foot pain since his visit here yesterday evening.  He was diagnosed with the foot sprain, went home and states he was dancing when he felt a sudden popping sensation along the right lateral aspect of his foot with worsened pain then previous.  He presents with the ASO he was given last night at the bedside, states he was not wearing it at the time of injury.  He has been able to bear weight but again with increased discomfort.  He denies any radiation of pain which is constant and throbbing.  He has taken no medications prior to arrival for this new injury.    Past Medical History  Diagnosis Date  . Paranoid schizophrenia   . Hyperlipidemia   . Schizophrenia   . Bipolar disorder    Past Surgical History  Procedure Laterality Date  . Testicle surgery     Family History  Problem Relation Age of Onset  . Schizophrenia Neg Hx    History  Substance Use Topics  . Smoking status: Current Every Day Smoker -- 1.00 packs/day    Types: Cigarettes  . Smokeless tobacco: Not on file  . Alcohol Use: No    Review of Systems  Constitutional: Negative for fever.  Musculoskeletal: Positive for joint swelling and arthralgias. Negative for myalgias.  Neurological: Negative for weakness and numbness.      Allergies  Other  Home Medications   Prior to Admission medications   Medication Sig Start Date End Date Taking? Authorizing Provider  fluPHENAZine decanoate (PROLIXIN) 25 MG/ML injection Inject 1 mL (25 mg total) into the muscle every 14 (fourteen) days. Last given on 07/20/2014.  NEXT dose due on  08/02/2014 Patient taking differently: Inject 25 mg into the muscle every 30 (thirty) days. Last given on 07/20/2014.  NEXT dose due on 08/02/2014 08/02/14  Yes Adonis Brook, NP  HYDROcodone-acetaminophen (NORCO/VICODIN) 5-325 MG per tablet Take 1 tablet by mouth every 4 (four) hours as needed. 11/28/14  Yes Hope Orlene Och, NP  atorvastatin (LIPITOR) 20 MG tablet Take 1 tablet (20 mg total) by mouth daily. 07/21/14   Adonis Brook, NP  benztropine (COGENTIN) 1 MG tablet Take 1 tablet (1 mg total) by mouth 2 (two) times daily in the am and at bedtime.. 07/21/14   Adonis Brook, NP  cyclobenzaprine (FLEXERIL) 10 MG tablet Take 1 tablet (10 mg total) by mouth 2 (two) times daily as needed for muscle spasms. 11/28/14   Hope Orlene Och, NP  fluPHENAZine (PROLIXIN) 5 MG tablet Take 2 tabs (10 mg) in the am.  Then taken 3 tabs (15 mg) in the evening. Patient taking differently: Take 10-15 mg by mouth 2 (two) times daily. Take 2 tabs (10 mg) in the am.  Then taken 3 tabs (15 mg) in the evening. 07/21/14   Adonis Brook, NP  hydrOXYzine (ATARAX/VISTARIL) 25 MG tablet Take 1 tablet (25 mg total) by mouth 3 (three) times daily as needed for anxiety (anxiety and sleep). 07/21/14   Adonis Brook, NP  lamoTRIgine (LAMICTAL) 25 MG tablet Take 1 tab (25 mg) in the morning.  Then take 3 tabs (75  mg) in the evening. 07/21/14   Adonis Brook, NP  Multiple Vitamin (MULTIVITAMIN WITH MINERALS) TABS tablet Take 1 tablet by mouth daily. 07/21/14   Adonis Brook, NP  naproxen (NAPROSYN) 500 MG tablet Take 1 tablet (500 mg total) by mouth 2 (two) times daily. 12/03/14   Burgess Amor, PA-C  nicotine (NICODERM CQ - DOSED IN MG/24 HOURS) 21 mg/24hr patch Place 1 patch (21 mg total) onto the skin daily. 07/21/14   Adonis Brook, NP  propranolol (INDERAL) 20 MG tablet Take 1 tablet (20 mg total) by mouth 3 (three) times daily. 07/21/14   Adonis Brook, NP  QUEtiapine (SEROQUEL) 300 MG tablet Take 1 tablet (300 mg total) by mouth at bedtime. 07/21/14    Adonis Brook, NP   BP 110/75 mmHg  Pulse 74  Temp(Src) 98.4 F (36.9 C) (Oral)  Resp 18  Ht  (1.854 m)  Wt 235 lb (106.595 kg)  BMI 31.01 kg/m2  SpO2 100% Physical Exam  Constitutional: He appears well-developed and well-nourished.  HENT:  Head: Atraumatic.  Neck: Normal range of motion.  Cardiovascular:  Pulses equal bilaterally  Musculoskeletal: He exhibits tenderness.       Right foot: There is tenderness, bony tenderness and swelling. There is normal capillary refill and no crepitus.       Feet:  Neurological: He is alert. He has normal strength. He displays normal reflexes. No sensory deficit.  Skin: Skin is warm and dry.  Psychiatric: He has a normal mood and affect.    ED Course  Procedures (including critical care time) Labs Review Labs Reviewed - No data to display  Imaging Review Dg Foot Complete Right  12/03/2014   CLINICAL DATA:  Injured right foot while dancing. Fifth metatarsal pain.  EXAM: RIGHT FOOT COMPLETE - 3+ VIEW  COMPARISON:  None.  FINDINGS: The joint spaces are maintained.  No acute fracture is identified.  IMPRESSION: No acute bony findings.   Electronically Signed   By: Rudie Meyer M.D.   On: 12/03/2014 17:30   Dg Foot Complete Right  12/02/2014   CLINICAL DATA:  Right foot inversion injury with pain. Initial encounter. With  EXAM: RIGHT FOOT COMPLETE - 3+ VIEW  COMPARISON:  None.  FINDINGS: There is no evidence of fracture or dislocation. There is no evidence of arthropathy or other focal bone abnormality. Soft tissues are unremarkable.  IMPRESSION: Negative.   Electronically Signed   By: Annia Belt M.D.   On: 12/02/2014 22:25     EKG Interpretation None      MDM   Final diagnoses:  Foot sprain, right, subsequent encounter    Patients labs and/or radiological studies were reviewed and considered during the medical decision making and disposition process. Imaging was reviewed, interpreted and I agree with radiologists reading.   Results were also discussed with patient.  Crutches offered, encouraged to continue RICE,  Wearing aso. F/u with pcp for a recheck if sx persist or worsen beyond the next 10 days.     Burgess Amor, PA-C 12/03/14 1750  Benjiman Core, MD 12/03/14 [phone]

## 2014-12-03 NOTE — Discharge Instructions (Signed)
Foot Sprain The muscles and cord like structures which attach muscle to bone (tendons) that surround the feet are made up of units. A foot sprain can occur at the weakest spot in any of these units. This condition is most often caused by injury to or overuse of the foot, as from playing contact sports, or aggravating a previous injury, or from poor conditioning, or obesity. SYMPTOMS  Pain with movement of the foot.  Tenderness and swelling at the injury site.  Loss of strength is present in moderate or severe sprains. THE THREE GRADES OR SEVERITY OF FOOT SPRAIN ARE:  Mild (Grade I): Slightly pulled muscle without tearing of muscle or tendon fibers or loss of strength.  Moderate (Grade II): Tearing of fibers in a muscle, tendon, or at the attachment to bone, with small decrease in strength.  Severe (Grade III): Rupture of the muscle-tendon-bone attachment, with separation of fibers. Severe sprain requires surgical repair. Often repeating (chronic) sprains are caused by overuse. Sudden (acute) sprains are caused by direct injury or over-use. DIAGNOSIS  Diagnosis of this condition is usually by your own observation. If problems continue, a caregiver may be required for further evaluation and treatment. X-rays may be required to make sure there are not breaks in the bones (fractures) present. Continued problems may require physical therapy for treatment. PREVENTION  Use strength and conditioning exercises appropriate for your sport.  Warm up properly prior to working out.  Use athletic shoes that are made for the sport you are participating in.  Allow adequate time for healing. Early return to activities makes repeat injury more likely, and can lead to an unstable arthritic foot that can result in prolonged disability. Mild sprains generally heal in 3 to 10 days, with moderate and severe sprains taking 2 to 10 weeks. Your caregiver can help you determine the proper time required for  healing. HOME CARE INSTRUCTIONS   Apply ice to the injury for 15-20 minutes, 03-04 times per day. Put the ice in a plastic bag and place a towel between the bag of ice and your skin.  An elastic wrap (like an Ace bandage) may be used to keep swelling down.  Keep foot above the level of the heart, or at least raised on a footstool, when swelling and pain are present.  Try to avoid use other than gentle range of motion while the foot is painful. Do not resume use until instructed by your caregiver. Then begin use gradually, not increasing use to the point of pain. If pain does develop, decrease use and continue the above measures, gradually increasing activities that do not cause discomfort, until you gradually achieve normal use.  Use crutches if and as instructed, and for the length of time instructed.  Keep injured foot and ankle wrapped between treatments.  Massage foot and ankle for comfort and to keep swelling down. Massage from the toes up towards the knee.  Only take over-the-counter or prescription medicines for pain, discomfort, or fever as directed by your caregiver. SEEK IMMEDIATE MEDICAL CARE IF:   Your pain and swelling increase, or pain is not controlled with medications.  You have loss of feeling in your foot or your foot turns cold or blue.  You develop new, unexplained symptoms, or an increase of the symptoms that brought you to your caregiver. MAKE SURE YOU:   Understand these instructions.  Will watch your condition.  Will get help right away if you are not doing well or get worse. Document Released:   10/05/2001 Document Revised: 07/08/2011 Document Reviewed: 12/03/2007 [employer] Patient Information 2015 [employer]. This information is not intended to replace advice given to you by your health care provider. Make sure you discuss any questions you have with your health care provider.  

## 2014-12-03 NOTE — ED Provider Notes (Signed)
CSN: [id number]     Arrival date & time 12/02/14  2109 History   First MD Initiated Contact with Patient 12/02/14 2135     Chief Complaint  Patient presents with  . Foot Injury     (Consider location/radiation/quality/duration/timing/severity/associated sxs/prior Treatment) The history is provided by the patient.    [name] is a 39 y.o. male presenting with pain and swelling of his right lateral foot since stepping off curb 2 days ago causing a twisting injury.  He reports persistent pain although has been able to weight bear, reporting he walks "everywhere he goes".  He has taken his hydrocodone which he has with temporary relief of pain.  He denies radiation of pain, weakness or numbness in the extremity.     Past Medical History  Diagnosis Date  . Paranoid schizophrenia   . Hyperlipidemia   . Schizophrenia   . Bipolar disorder    Past Surgical History  Procedure Laterality Date  . Testicle surgery     Family History  Problem Relation Age of Onset  . Schizophrenia Neg Hx    History  Substance Use Topics  . Smoking status: Current Every Day Smoker -- 1.00 packs/day    Types: Cigarettes  . Smokeless tobacco: Not on file  . Alcohol Use: No    Review of Systems  Constitutional: Negative for fever.  Musculoskeletal: Positive for joint swelling and arthralgias. Negative for myalgias.  Neurological: Negative for weakness and numbness.      Allergies  Other  Home Medications   Prior to Admission medications   Medication Sig Start Date End Date Taking? Authorizing Provider  atorvastatin (LIPITOR) 20 MG tablet Take 1 tablet (20 mg total) by mouth daily. 07/21/14   Adonis Brook, NP  benztropine (COGENTIN) 1 MG tablet Take 1 tablet (1 mg total) by mouth 2 (two) times daily in the am and at bedtime.. 07/21/14   Adonis Brook, NP  cyclobenzaprine (FLEXERIL) 10 MG tablet Take 1 tablet (10 mg total) by mouth 2 (two) times daily as needed for muscle spasms. 11/28/14    Hope Orlene Och, NP  fluPHENAZine (PROLIXIN) 5 MG tablet Take 2 tabs (10 mg) in the am.  Then taken 3 tabs (15 mg) in the evening. 07/21/14   Adonis Brook, NP  fluPHENAZine decanoate (PROLIXIN) 25 MG/ML injection Inject 1 mL (25 mg total) into the muscle every 14 (fourteen) days. Last given on 07/20/2014.  NEXT dose due on 08/02/2014 08/02/14   Adonis Brook, NP  HYDROcodone-acetaminophen (NORCO/VICODIN) 5-325 MG per tablet Take 1 tablet by mouth every 4 (four) hours as needed. 11/28/14   Hope Orlene Och, NP  hydrOXYzine (ATARAX/VISTARIL) 25 MG tablet Take 1 tablet (25 mg total) by mouth 3 (three) times daily as needed for anxiety (anxiety and sleep). 07/21/14   Adonis Brook, NP  lamoTRIgine (LAMICTAL) 25 MG tablet Take 1 tab (25 mg) in the morning.  Then take 3 tabs (75 mg) in the evening. 07/21/14   Adonis Brook, NP  Multiple Vitamin (MULTIVITAMIN WITH MINERALS) TABS tablet Take 1 tablet by mouth daily. 07/21/14   Adonis Brook, NP  nicotine (NICODERM CQ - DOSED IN MG/24 HOURS) 21 mg/24hr patch Place 1 patch (21 mg total) onto the skin daily. 07/21/14   Adonis Brook, NP  propranolol (INDERAL) 20 MG tablet Take 1 tablet (20 mg total) by mouth 3 (three) times daily. 07/21/14   Adonis Brook, NP  QUEtiapine (SEROQUEL) 300 MG tablet Take 1 tablet (300 mg total) by mouth  at bedtime. 07/21/14   Adonis Brook, NP   BP 130/86 mmHg  Pulse 80  Temp(Src) 98.2 F (36.8 C) (Oral)  Resp 16  Ht 6\' 2"  (1.88 m)  Wt 235 lb (106.595 kg)  BMI 30.16 kg/m2  SpO2 100% Physical Exam  Constitutional: He appears well-developed and well-nourished.  HENT:  Head: Atraumatic.  Neck: Normal range of motion.  Cardiovascular:  Pulses equal bilaterally  Musculoskeletal: He exhibits tenderness.       Right foot: There is tenderness, bony tenderness and swelling. There is normal capillary refill and no crepitus.       Feet:  Neurological: He is alert. He has normal strength. He displays normal reflexes. No sensory deficit.    Skin: Skin is warm and dry.  Psychiatric: He has a normal mood and affect.    ED Course  Procedures (including critical care time) Labs Review Labs Reviewed - No data to display  Imaging Review Dg Foot Complete Right  12/02/2014   CLINICAL DATA:  Right foot inversion injury with pain. Initial encounter. With  EXAM: RIGHT FOOT COMPLETE - 3+ VIEW  COMPARISON:  None.  FINDINGS: There is no evidence of fracture or dislocation. There is no evidence of arthropathy or other focal bone abnormality. Soft tissues are unremarkable.  IMPRESSION: Negative.   Electronically Signed   By: Annia Belt M.D.   On: 12/02/2014 22:25     EKG Interpretation None      MDM   Final diagnoses:  Foot sprain, right, initial encounter    Patients labs and/or radiological studies were reviewed and considered during the medical decision making and disposition process. Imaging was reviewed, interpreted and I agree with radiologists reading. Results were also discussed with patient.  Pt was placed in aso, crutches offered but pt deferred.  He was advised RICE, f/u with pcp if sx persist beyond the next week.    Burgess Amor, PA-C 12/03/14 1629  Benjiman Core, MD 12/03/14 1700

## 2014-12-03 NOTE — ED Notes (Signed)
Injury to right foot on Wednesday and treated.  Today he re-injuried in [place]. Rates pain 10/10 to right foot.

## 2014-12-03 NOTE — ED Notes (Addendum)
Patient discharged to waiting room in wheelchair, patient states a family member is going to come pick him up. States he will not to be walking on crutches home.

## 2014-12-26 DIAGNOSIS — M79671 Pain in right foot: Secondary | ICD-10-CM | POA: Diagnosis not present

## 2015-04-10 ENCOUNTER — Emergency Department (HOSPITAL_COMMUNITY)
Admission: EM | Admit: 2015-04-10 | Discharge: 2015-04-10 | Disposition: A | Discharge status: Home / Self Care | Payer: Medicare Other | Attending: Emergency Medicine | Admitting: Emergency Medicine

## 2015-04-10 ENCOUNTER — Encounter (HOSPITAL_COMMUNITY): Payer: Self-pay | Admitting: Emergency Medicine

## 2015-04-10 DIAGNOSIS — F17210 Nicotine dependence, cigarettes, uncomplicated: Secondary | ICD-10-CM | POA: Diagnosis not present

## 2015-04-10 DIAGNOSIS — Z79899 Other long term (current) drug therapy: Secondary | ICD-10-CM | POA: Insufficient documentation

## 2015-04-10 DIAGNOSIS — R21 Rash and other nonspecific skin eruption: Secondary | ICD-10-CM | POA: Diagnosis present

## 2015-04-10 DIAGNOSIS — F209 Schizophrenia, unspecified: Secondary | ICD-10-CM | POA: Insufficient documentation

## 2015-04-10 DIAGNOSIS — Z791 Long term (current) use of non-steroidal anti-inflammatories (NSAID): Secondary | ICD-10-CM | POA: Diagnosis not present

## 2015-04-10 DIAGNOSIS — E785 Hyperlipidemia, unspecified: Secondary | ICD-10-CM | POA: Diagnosis not present

## 2015-04-10 DIAGNOSIS — L03116 Cellulitis of left lower limb: Secondary | ICD-10-CM

## 2015-04-10 DIAGNOSIS — F319 Bipolar disorder, unspecified: Secondary | ICD-10-CM | POA: Diagnosis not present

## 2015-04-10 LAB — BASIC METABOLIC PANEL
Anion gap: 9 (ref 5–15)
BUN: 8 mg/dL (ref 6–20)
CO2: 26 mmol/L (ref 22–32)
Calcium: 9.2 mg/dL (ref 8.9–10.3)
Chloride: 106 mmol/L (ref 101–111)
Creatinine, Ser: 1.02 mg/dL (ref 0.61–1.24)
GFR calc Af Amer: >60 mL/min (ref >60)
GFR calc non Af Amer: >60 mL/min (ref >60)
GLUCOSE: 103 mg/dL — AB (ref 65–99)
POTASSIUM: 3.4 mmol/L — AB (ref 3.5–5.1)
Sodium: 141 mmol/L (ref 135–145)

## 2015-04-10 LAB — CBC WITH DIFFERENTIAL/PLATELET
Basophils Absolute: 0 10*3/uL (ref 0.0–0.1)
Basophils Relative: 0 %
Eosinophils Absolute: 0.3 10*3/uL (ref 0.0–0.7)
Eosinophils Relative: 3 %
HCT: 37.8 % — ABNORMAL LOW (ref 39.0–52.0)
HEMOGLOBIN: 13.5 g/dL (ref 13.0–17.0)
LYMPHS ABS: 2.6 10*3/uL (ref 0.7–4.0)
LYMPHS PCT: 22 %
MCH: 30.3 pg (ref 26.0–34.0)
MCHC: 35.7 g/dL (ref 30.0–36.0)
MCV: 84.8 fL (ref 78.0–100.0)
MONOS PCT: 5 %
Monocytes Absolute: 0.6 10*3/uL (ref 0.1–1.0)
NEUTROS PCT: 70 %
Neutro Abs: 8.3 10*3/uL — ABNORMAL HIGH (ref 1.7–7.7)
Platelets: 282 10*3/uL (ref 150–400)
RBC: 4.46 MIL/uL (ref 4.22–5.81)
RDW: 12 % (ref 11.5–15.5)
WBC: 12 10*3/uL — AB (ref 4.0–10.5)

## 2015-04-10 MED ORDER — CLINDAMYCIN PHOSPHATE 600 MG/50ML IV SOLN
600.0000 mg | Freq: Once | INTRAVENOUS | Status: AC
  Administered 2015-04-10: 600 mg via INTRAVENOUS
  Filled 2015-04-10: qty 50

## 2015-04-10 MED ORDER — CLINDAMYCIN HCL 150 MG PO CAPS: 150.0000 mg | ORAL_CAPSULE | Freq: Four times a day (QID) | ORAL | Status: DC

## 2015-04-10 MED ORDER — IBUPROFEN 600 MG PO TABS: 600.0000 mg | ORAL_TABLET | Freq: Four times a day (QID) | ORAL | Status: DC | PRN

## 2015-04-10 NOTE — ED Provider Notes (Signed)
CSN: [id number]     Arrival date & time 04/10/15  1753 History  By signing my name below, I, [name], attest that this documentation has been prepared under the direction and in the presence of Burgess Amor, PA-C. Electronically Signed: Placido [name], ED Scribe. 04/10/2015. 7:09 PM.   Chief Complaint  Patient presents with  . Insect Bite    left lower leg.   The history is provided by the patient. No language interpreter was used.    HPI Comments: [name] is a 39 y.o. male with a past medical history indicated below presenting with redness, swelling and blisters his left lower leg with onset 2 days ago. Pt notes associated itchiness and warmth and constant, moderate pain to the affected region that worsens with bending and weight bearing.  He suspects he may have sustained an insect bite since he first noticed the itchy blisters but did not actually witness such an event.  Pt denies any recent trauma or wounds and is unsure of a possible insect bite. He states he walks miles daily around town as he has today, but with more discomfort than normal.  He denies fevers, chills, nausea or weakness.     Past Medical History  Diagnosis Date  . Paranoid schizophrenia (HCC)   . Hyperlipidemia   . Schizophrenia (HCC)   . Bipolar disorder [facility])    Past Surgical History  Procedure Laterality Date  . Testicle surgery     Family History  Problem Relation Age of Onset  . Schizophrenia Neg Hx    Social History  Substance Use Topics  . Smoking status: Current Every Day Smoker -- 1.00 packs/day    Types: Cigarettes  . Smokeless tobacco: None  . Alcohol Use: No    Review of Systems  Constitutional: Negative for fever and chills.  Respiratory: Negative for shortness of breath and wheezing.   Gastrointestinal: Negative for nausea.  Musculoskeletal: Negative for joint swelling and arthralgias.  Skin: Positive for color change and rash. Negative for wound.  Neurological: Negative for  numbness.   Allergies  Other  Home Medications   Prior to Admission medications   Medication Sig Start Date End Date Taking? Authorizing Provider  atorvastatin (LIPITOR) 20 MG tablet Take 1 tablet (20 mg total) by mouth daily. 07/21/14   Adonis Brook, NP  benztropine (COGENTIN) 1 MG tablet Take 1 tablet (1 mg total) by mouth 2 (two) times daily in the am and at bedtime.. 07/21/14   Adonis Brook, NP  clindamycin (CLEOCIN) 150 MG capsule Take 1 capsule (150 mg total) by mouth every 6 (six) hours. 04/10/15   Burgess Amor, PA-C  cyclobenzaprine (FLEXERIL) 10 MG tablet Take 1 tablet (10 mg total) by mouth 2 (two) times daily as needed for muscle spasms. 11/28/14   Hope Orlene Och, NP  fluPHENAZine (PROLIXIN) 5 MG tablet Take 2 tabs (10 mg) in the am.  Then taken 3 tabs (15 mg) in the evening. Patient taking differently: Take 10-15 mg by mouth 2 (two) times daily. Take 2 tabs (10 mg) in the am.  Then taken 3 tabs (15 mg) in the evening. 07/21/14   Adonis Brook, NP  fluPHENAZine decanoate (PROLIXIN) 25 MG/ML injection Inject 1 mL (25 mg total) into the muscle every 14 (fourteen) days. Last given on 07/20/2014.  NEXT dose due on 08/02/2014 Patient taking differently: Inject 25 mg into the muscle every 30 (thirty) days. Last given on 07/20/2014.  NEXT dose due on 08/02/2014 08/02/14  Adonis Brook, NP  HYDROcodone-acetaminophen (NORCO/VICODIN) 5-325 MG per tablet Take 1 tablet by mouth every 4 (four) hours as needed. 11/28/14   Hope Orlene Och, NP  hydrOXYzine (ATARAX/VISTARIL) 25 MG tablet Take 1 tablet (25 mg total) by mouth 3 (three) times daily as needed for anxiety (anxiety and sleep). 07/21/14   Adonis Brook, NP  ibuprofen (ADVIL,MOTRIN) 600 MG tablet Take 1 tablet (600 mg total) by mouth every 6 (six) hours as needed. 04/10/15   Burgess Amor, PA-C  lamoTRIgine (LAMICTAL) 25 MG tablet Take 1 tab (25 mg) in the morning.  Then take 3 tabs (75 mg) in the evening. 07/21/14   Adonis Brook, NP  Multiple Vitamin  (MULTIVITAMIN WITH MINERALS) TABS tablet Take 1 tablet by mouth daily. 07/21/14   Adonis Brook, NP  naproxen (NAPROSYN) 500 MG tablet Take 1 tablet (500 mg total) by mouth 2 (two) times daily. 12/03/14   Burgess Amor, PA-C  nicotine (NICODERM CQ - DOSED IN MG/24 HOURS) 21 mg/24hr patch Place 1 patch (21 mg total) onto the skin daily. 07/21/14   Adonis Brook, NP  propranolol (INDERAL) 20 MG tablet Take 1 tablet (20 mg total) by mouth 3 (three) times daily. 07/21/14   Adonis Brook, NP  QUEtiapine (SEROQUEL) 300 MG tablet Take 1 tablet (300 mg total) by mouth at bedtime. 07/21/14   Adonis Brook, NP   BP 122/74 mmHg  Pulse 75  Temp(Src) 98.1 F (36.7 C) (Oral)  Resp 16  Ht  (1.854 m)  Wt 95.255 kg  BMI 27.71 kg/m2  SpO2 100% Physical Exam  Constitutional: He is oriented to person, place, and time. He appears well-developed and well-nourished.  HENT:  Head: Normocephalic and atraumatic.  Mouth/Throat: No oropharyngeal exudate.  Neck: Normal range of motion. No tracheal deviation present.  Cardiovascular: Normal rate.   Pulses:      Dorsalis pedis pulses are 2+ on the right side, and 2+ on the left side.  Pulmonary/Chest: Effort normal. No respiratory distress.  Abdominal: Soft. There is no tenderness.  Musculoskeletal: He exhibits edema.  moderate confluent edema of LLE with increased warmth; mild induration with scattered areas of tiny vesicles and 1 larger vesicle at his medial ankle; no red streaking; no palpable cords. Vesicles are intact.    Neurological: He is alert and oriented to person, place, and time.  Skin: Skin is warm and dry. Rash noted. Rash is vesicular. He is not diaphoretic. There is erythema.  Psychiatric: He has a normal mood and affect. His behavior is normal.  Nursing note and vitals reviewed.  ED Course  Procedures  DIAGNOSTIC STUDIES: Oxygen Saturation is 99% on RA, normal by my interpretation.    COORDINATION OF CARE: 7:02 PM Pt presents today due to  worsening left lower leg swelling and pain. Discussed next steps with pt and he agreed to plan.   Labs Review Labs Reviewed  CBC WITH DIFFERENTIAL/PLATELET - Abnormal; Notable for the following:    WBC 12.0 (*)    HCT 37.8 (*)    Neutro Abs 8.3 (*)    All other components within normal limits  BASIC METABOLIC PANEL - Abnormal; Notable for the following:    Potassium 3.4 (*)    Glucose, Bld 103 (*)    All other components within normal limits    Imaging Review No results found. I have personally reviewed and evaluated these images and lab results as part of my medical decision-making.   EKG Interpretation None  MDM   Final diagnoses:  Cellulitis of left lower extremity    Pt also seen by Dr. Fayrene FearingJames during this ed visit. Pt given clindamycin IV, script for same. Advised elevation, warm compresses and return for  Recheck in 2 days, sooner for any grossly worsened sx or spreading redness. Suspect staph given vesicular appearance of cellulitis.  I personally performed the services described in this documentation, which was scribed in my presence. The recorded information has been reviewed and is accurate.   Burgess AmorJulie Hurschel Paynter, PA-C 04/11/15 1635  Burgess AmorJulie [place]ope[place] Giammona, PA-C 04/11/15 1637  Rolland PorterMark James, MD 04/13/15 [phone]

## 2015-04-10 NOTE — ED Notes (Signed)
Insect bite to left lower leg, rates pain 7/10 when walking.

## 2015-04-10 NOTE — Discharge Instructions (Signed)

## 2015-04-10 NOTE — ED Notes (Signed)
EDPa in to see pt for initial assessment. 

## 2015-04-11 ENCOUNTER — Emergency Department (HOSPITAL_COMMUNITY)
Admission: EM | Admit: 2015-04-11 | Discharge: 2015-04-11 | Disposition: A | Discharge status: Home / Self Care | Payer: Medicare Other | Attending: Emergency Medicine | Admitting: Emergency Medicine

## 2015-04-11 ENCOUNTER — Encounter (HOSPITAL_COMMUNITY): Payer: Self-pay | Admitting: Emergency Medicine

## 2015-04-11 DIAGNOSIS — F209 Schizophrenia, unspecified: Secondary | ICD-10-CM | POA: Diagnosis not present

## 2015-04-11 DIAGNOSIS — L988 Other specified disorders of the skin and subcutaneous tissue: Secondary | ICD-10-CM | POA: Insufficient documentation

## 2015-04-11 DIAGNOSIS — L03116 Cellulitis of left lower limb: Secondary | ICD-10-CM | POA: Diagnosis not present

## 2015-04-11 DIAGNOSIS — E785 Hyperlipidemia, unspecified: Secondary | ICD-10-CM | POA: Insufficient documentation

## 2015-04-11 DIAGNOSIS — F17210 Nicotine dependence, cigarettes, uncomplicated: Secondary | ICD-10-CM | POA: Insufficient documentation

## 2015-04-11 DIAGNOSIS — M7989 Other specified soft tissue disorders: Secondary | ICD-10-CM | POA: Diagnosis not present

## 2015-04-11 DIAGNOSIS — F319 Bipolar disorder, unspecified: Secondary | ICD-10-CM | POA: Diagnosis not present

## 2015-04-11 DIAGNOSIS — Z79899 Other long term (current) drug therapy: Secondary | ICD-10-CM | POA: Diagnosis not present

## 2015-04-11 DIAGNOSIS — Z791 Long term (current) use of non-steroidal anti-inflammatories (NSAID): Secondary | ICD-10-CM | POA: Insufficient documentation

## 2015-04-11 MED ORDER — SULFAMETHOXAZOLE-TRIMETHOPRIM 800-160 MG PO TABS: 1.0000 | ORAL_TABLET | Freq: Two times a day (BID) | ORAL | Status: AC

## 2015-04-11 MED ORDER — SULFAMETHOXAZOLE-TRIMETHOPRIM 800-160 MG PO TABS
1.0000 | ORAL_TABLET | Freq: Once | ORAL | Status: AC
  Administered 2015-04-11: 1 via ORAL
  Filled 2015-04-11: qty 1

## 2015-04-11 MED ORDER — SILVER SULFADIAZINE 1 % EX CREA
TOPICAL_CREAM | CUTANEOUS | Status: AC
  Administered 2015-04-11: 1 via TOPICAL
  Filled 2015-04-11: qty 50

## 2015-04-11 MED ORDER — SILVER SULFADIAZINE 1 % EX CREA
TOPICAL_CREAM | Freq: Every day | CUTANEOUS | Status: DC
  Administered 2015-04-11: 1 via TOPICAL

## 2015-04-11 NOTE — ED Notes (Signed)
MD at bedside. 

## 2015-04-11 NOTE — ED Notes (Signed)
Pt states that he was here yesterday for cellulitis and feels that the blisters are worse today.

## 2015-04-11 NOTE — ED Provider Notes (Signed)
The patient is a 39 year old male, he recently states that he was hiking when he experienced a bug bite to his leg, since that time he has had progressive swelling of redness and warmth of the left lower extremity between the knee and ankle. On exam he has circumferential erythema with several blistering areas that appear to be vesicular, there is normal range of motion at the ankle and the knee, he has slight swelling left compared to right but has warmth redness and tenderness with palpation. He has no fever, no tachycardia, he was given clindamycin yesterday but did not get his antibiotics filled until just prior to arrival. He wanted a second opinion since the blister seemed to have had a slight increase. The patient appears nontoxic, we will switch him to Bactrim due to better MRSA coverage, the patient has expressed his understanding, he will have a line drawn around these areas and instructed to return should the redness spread. Stable for discharge  Medical screening examination/treatment/procedure(s) were conducted as a shared visit with non-physician practitioner(s) and myself.  I personally evaluated the patient during the encounter.  Clinical Impression:   Final diagnoses:  Cellulitis of left lower extremity         Eber HongBrian Sundus Pete, MD 04/11/15 [phone]

## 2015-04-11 NOTE — Discharge Instructions (Signed)
Please read and follow all provided instructions.  Your diagnoses today include:  1. Cellulitis of left lower extremity     Tests performed today include:  Vital signs. See below for your results today.   Medications prescribed:   Take any prescribed medications only as directed.   Home care instructions:  Follow any educational materials contained in this packet. Keep affected area above the level of your heart when possible. Wash area gently twice a day with warm soapy water. Do not apply alcohol or hydrogen peroxide. Cover the area if it draining or weeping.   Follow-up instructions:  Return to the Emergency Department in 48 hours for a recheck if your symptoms are not significantly improved.   Please follow-up with your primary care provider in the next 1 week for further evaluation of your symptoms.   Return instructions:  Return to the Emergency Department if you have:  Fever  Worsening symptoms  Worsening pain  Worsening swelling  Redness of the skin that moves away from the affected area, especially if it streaks away from the affected area   Any other emergent concerns  Your vital signs today were: BP 128/82 mmHg   Pulse 80   Temp(Src) 98 F (36.7 C) (Oral)   Resp 16   Ht 6\' 2"  (1.88 m)   Wt 95.255 kg   BMI 26.95 kg/m2   SpO2 100% If your blood pressure (BP) was elevated above 135/85 this visit, please have this repeated by your doctor within one month. --------------

## 2015-04-11 NOTE — ED Provider Notes (Signed)
CSN: [id number]     Arrival date & time 04/11/15  1459 History   First MD Initiated Contact with Patient 04/11/15 1511     No chief complaint on file. 39 y.o. male with  has a past medical history of Paranoid schizophrenia (HCC); Hyperlipidemia; Schizophrenia (HCC); and Bipolar disorder (HCC)., presents to the Emergency Department today complaining of itching and blisters on his left leg. States that he noticed a mosquito bite this past Saturday when he was lying on his couch and started itching it. Noticed blisters later on Sunday and came to the ED to be evaluated. Was discharged with ibuprofen and clindamycin and sent home with return instructions. Today, he states that the blisters grew. No new onset of pain and gives it a 0/10 on the pain scale. Skin remains itchy and warm. One of the blisters popped and he states that it "feels like a burn." Pt likes to hike through the woods and thinks it may have been due to an insect bite. Pt denies fevers, chills, night sweats. No N/V. No pain with ambulation. Pt denies any other complaints.   HPI  Past Medical History  Diagnosis Date  . Paranoid schizophrenia (HCC)   . Hyperlipidemia   . Schizophrenia (HCC)   . Bipolar disorder [facility]HCC)    Past Surgical History  Procedure Laterality Date  . Testicle surgery     Family History  Problem Relation Age of Onset  . Schizophrenia Neg Hx    Social History  Substance Use Topics  . Smoking status: Current Every Day Smoker -- 1.00 packs/day    Types: Cigarettes  . Smokeless tobacco: None  . Alcohol Use: No    Review of Systems  All other systems reviewed and are negative.   Allergies  Other  Home Medications   Prior to Admission medications   Medication Sig Start Date End Date Taking? Authorizing Provider  atorvastatin (LIPITOR) 20 MG tablet Take 1 tablet (20 mg total) by mouth daily. 07/21/14   Adonis BrookSheila Agustin, NP  benztropine (COGENTIN) 1 MG tablet Take 1 tablet (1 mg total) by mouth 2 (two)  times daily in the am and at bedtime.. 07/21/14   Adonis BrookSheila Agustin, NP  clindamycin (CLEOCIN) 150 MG capsule Take 1 capsule (150 mg total) by mouth every 6 (six) hours. 04/10/15   Burgess AmorJulie Idol, PA-C  cyclobenzaprine (FLEXERIL) 10 MG tablet Take 1 tablet (10 mg total) by mouth 2 (two) times daily as needed for muscle spasms. 11/28/14   Hope Orlene OchM Neese, NP  fluPHENAZine (PROLIXIN) 5 MG tablet Take 2 tabs (10 mg) in the am.  Then taken 3 tabs (15 mg) in the evening. Patient taking differently: Take 10-15 mg by mouth 2 (two) times daily. Take 2 tabs (10 mg) in the am.  Then taken 3 tabs (15 mg) in the evening. 07/21/14   Adonis BrookSheila Agustin, NP  fluPHENAZine decanoate (PROLIXIN) 25 MG/ML injection Inject 1 mL (25 mg total) into the muscle every 14 (fourteen) days. Last given on 07/20/2014.  NEXT dose due on 08/02/2014 Patient taking differently: Inject 25 mg into the muscle every 30 (thirty) days. Last given on 07/20/2014.  NEXT dose due on 08/02/2014 08/02/14   Adonis BrookSheila Agustin, NP  HYDROcodone-acetaminophen (NORCO/VICODIN) 5-325 MG per tablet Take 1 tablet by mouth every 4 (four) hours as needed. 11/28/14   Hope Orlene OchM Neese, NP  hydrOXYzine (ATARAX/VISTARIL) 25 MG tablet Take 1 tablet (25 mg total) by mouth 3 (three) times daily as needed for anxiety (anxiety and sleep).  07/21/14   Adonis Brook, NP  ibuprofen (ADVIL,MOTRIN) 600 MG tablet Take 1 tablet (600 mg total) by mouth every 6 (six) hours as needed. 04/10/15   Burgess Amor, PA-C  lamoTRIgine (LAMICTAL) 25 MG tablet Take 1 tab (25 mg) in the morning.  Then take 3 tabs (75 mg) in the evening. 07/21/14   Adonis Brook, NP  Multiple Vitamin (MULTIVITAMIN WITH MINERALS) TABS tablet Take 1 tablet by mouth daily. 07/21/14   Adonis Brook, NP  naproxen (NAPROSYN) 500 MG tablet Take 1 tablet (500 mg total) by mouth 2 (two) times daily. 12/03/14   Burgess Amor, PA-C  nicotine (NICODERM CQ - DOSED IN MG/24 HOURS) 21 mg/24hr patch Place 1 patch (21 mg total) onto the skin daily. 07/21/14   Adonis Brook, NP  propranolol (INDERAL) 20 MG tablet Take 1 tablet (20 mg total) by mouth 3 (three) times daily. 07/21/14   Adonis Brook, NP  QUEtiapine (SEROQUEL) 300 MG tablet Take 1 tablet (300 mg total) by mouth at bedtime. 07/21/14   Adonis Brook, NP   BP 128/82 mmHg  Pulse 80  Temp(Src) 98 F (36.7 C) (Oral)  Resp 16  Ht  (1.88 m)  Wt 95.255 kg  BMI 26.95 kg/m2  SpO2 100% Physical Exam  Constitutional: He is oriented to person, place, and time. He appears well-developed and well-nourished.  Cardiovascular: Normal rate and regular rhythm.   Pulmonary/Chest: Effort normal.  Musculoskeletal: He exhibits no tenderness.       Left ankle: He exhibits swelling. He exhibits normal range of motion and normal pulse. No tenderness.       Left lower leg: He exhibits swelling. He exhibits no tenderness.       Legs: Neurological: He is alert and oriented to person, place, and time.  Skin: Skin is warm and dry.  Psychiatric: He has a normal mood and affect. His speech is normal and behavior is normal.    ED Course  Procedures (including critical care time) Labs Review Labs Reviewed - No data to display  Imaging Review No results found. I have personally reviewed and evaluated these images and lab results as part of my medical decision-making.   EKG Interpretation None     MDM  I have reviewed the relevant previous healthcare records. I obtained HPI from historian. Cases discussed with Attending Physician  ED Course: 3:50 PM- Given Bactrim PO   Assessment: Pt is a 39y M with hx of schizophrenia that presents with cellulitis of his L lower extremity. Pt is able to ambulate, neg Homan's and does not present with pain on his calf. Hx is suggestive of an insect bite that lead to infection due to patient itching affected area. No indication for ultrasound or D-Dimer due to low suspicion for DVT.     Disposition/Plan:  Based on local sensitivities, switching to Bactrim PO for  coverage of MRSA  Line drawn around area and instructed to return should the redness spread Sent home with d/c instructions     Patient was discussed with Eber Hong, MD    Final diagnoses:  None      Audry Pili, PA-C 04/11/15 2347  Eber Hong, MD 04/12/15 [phone]

## 2015-10-31 ENCOUNTER — Encounter (HOSPITAL_COMMUNITY): Payer: Self-pay | Admitting: Emergency Medicine

## 2015-10-31 ENCOUNTER — Emergency Department (HOSPITAL_COMMUNITY)
Admission: EM | Admit: 2015-10-31 | Discharge: 2015-10-31 | Disposition: A | Discharge status: Home / Self Care | Payer: Medicare Other | Attending: Emergency Medicine | Admitting: Emergency Medicine

## 2015-10-31 DIAGNOSIS — S93401A Sprain of unspecified ligament of right ankle, initial encounter: Secondary | ICD-10-CM | POA: Insufficient documentation

## 2015-10-31 DIAGNOSIS — Z79899 Other long term (current) drug therapy: Secondary | ICD-10-CM | POA: Insufficient documentation

## 2015-10-31 DIAGNOSIS — E785 Hyperlipidemia, unspecified: Secondary | ICD-10-CM | POA: Diagnosis not present

## 2015-10-31 DIAGNOSIS — F209 Schizophrenia, unspecified: Secondary | ICD-10-CM | POA: Diagnosis not present

## 2015-10-31 DIAGNOSIS — Z791 Long term (current) use of non-steroidal anti-inflammatories (NSAID): Secondary | ICD-10-CM | POA: Diagnosis not present

## 2015-10-31 DIAGNOSIS — S99911A Unspecified injury of right ankle, initial encounter: Secondary | ICD-10-CM | POA: Diagnosis present

## 2015-10-31 DIAGNOSIS — F319 Bipolar disorder, unspecified: Secondary | ICD-10-CM | POA: Insufficient documentation

## 2015-10-31 DIAGNOSIS — X58XXXA Exposure to other specified factors, initial encounter: Secondary | ICD-10-CM | POA: Diagnosis not present

## 2015-10-31 DIAGNOSIS — Y929 Unspecified place or not applicable: Secondary | ICD-10-CM | POA: Diagnosis not present

## 2015-10-31 DIAGNOSIS — F17210 Nicotine dependence, cigarettes, uncomplicated: Secondary | ICD-10-CM | POA: Insufficient documentation

## 2015-10-31 DIAGNOSIS — Y999 Unspecified external cause status: Secondary | ICD-10-CM | POA: Diagnosis not present

## 2015-10-31 DIAGNOSIS — Y939 Activity, unspecified: Secondary | ICD-10-CM | POA: Insufficient documentation

## 2015-10-31 MED ORDER — ACETAMINOPHEN 325 MG PO TABS
650.0000 mg | ORAL_TABLET | Freq: Once | ORAL | Status: AC
  Administered 2015-10-31: 650 mg via ORAL
  Filled 2015-10-31: qty 2

## 2015-10-31 MED ORDER — IBUPROFEN 800 MG PO TABS
800.0000 mg | ORAL_TABLET | Freq: Once | ORAL | Status: AC
Start: 2015-10-31 — End: 2015-10-31
  Administered 2015-10-31: 800 mg via ORAL
  Filled 2015-10-31: qty 1

## 2015-10-31 NOTE — ED Notes (Signed)
Pt states he has been walking a lot and his right foot hurts.  Describes an old injury.

## 2015-10-31 NOTE — ED Provider Notes (Signed)
CSN: [id number]     Arrival date & time 10/31/15  1438 History   First MD Initiated Contact with Patient 10/31/15 1502     Chief Complaint  Patient presents with  . Foot Injury     (Consider location/radiation/quality/duration/timing/severity/associated sxs/prior Treatment) Patient is a 40 y.o. male presenting with foot injury. The history is provided by the patient.  Foot Injury Location:  Ankle Time since incident:  3 days Ankle location:  R ankle Pain details:    Quality:  Aching and shooting   Radiates to:  R leg   Severity:  Moderate   Onset quality:  Gradual   Duration:  3 days   Timing:  Intermittent   Progression:  Worsening Chronicity:  Recurrent Dislocation: no   Prior injury to area:  Yes Relieved by:  Nothing Worsened by:  Bearing weight Associated symptoms: swelling   Associated symptoms: no fever and no numbness   Risk factors: no frequent fractures and no known bone disorder     Past Medical History  Diagnosis Date  . Paranoid schizophrenia (HCC)   . Hyperlipidemia   . Schizophrenia (HCC)   . Bipolar disorder [facility](HCC)    Past Surgical History  Procedure Laterality Date  . Testicle surgery     Family History  Problem Relation Age of Onset  . Schizophrenia Neg Hx    Social History  Substance Use Topics  . Smoking status: Current Every Day Smoker -- 1.00 packs/day    Types: Cigarettes  . Smokeless tobacco: None  . Alcohol Use: No    Review of Systems  Constitutional: Negative for fever.  All other systems reviewed and are negative.     Allergies  Other  Home Medications   Prior to Admission medications   Medication Sig Start Date End Date Taking? Authorizing Provider  atorvastatin (LIPITOR) 20 MG tablet Take 1 tablet (20 mg total) by mouth daily. 07/21/14   Adonis BrookSheila Agustin, NP  benztropine (COGENTIN) 1 MG tablet Take 1 tablet (1 mg total) by mouth 2 (two) times daily in the am and at bedtime.. 07/21/14   Adonis BrookSheila Agustin, NP  cyclobenzaprine  (FLEXERIL) 10 MG tablet Take 1 tablet (10 mg total) by mouth 2 (two) times daily as needed for muscle spasms. 11/28/14   Hope Orlene OchM Neese, NP  fluPHENAZine (PROLIXIN) 5 MG tablet Take 2 tabs (10 mg) in the am.  Then taken 3 tabs (15 mg) in the evening. Patient taking differently: Take 10-15 mg by mouth 2 (two) times daily. Take 2 tabs (10 mg) in the am.  Then taken 3 tabs (15 mg) in the evening. 07/21/14   Adonis BrookSheila Agustin, NP  fluPHENAZine decanoate (PROLIXIN) 25 MG/ML injection Inject 1 mL (25 mg total) into the muscle every 14 (fourteen) days. Last given on 07/20/2014.  NEXT dose due on 08/02/2014 Patient taking differently: Inject 25 mg into the muscle every 30 (thirty) days. Last given on 07/20/2014.  NEXT dose due on 08/02/2014 08/02/14   Adonis BrookSheila Agustin, NP  HYDROcodone-acetaminophen (NORCO/VICODIN) 5-325 MG per tablet Take 1 tablet by mouth every 4 (four) hours as needed. 11/28/14   Hope Orlene OchM Neese, NP  hydrOXYzine (ATARAX/VISTARIL) 25 MG tablet Take 1 tablet (25 mg total) by mouth 3 (three) times daily as needed for anxiety (anxiety and sleep). 07/21/14   Adonis BrookSheila Agustin, NP  ibuprofen (ADVIL,MOTRIN) 600 MG tablet Take 1 tablet (600 mg total) by mouth every 6 (six) hours as needed. 04/10/15   Burgess AmorJulie Idol, PA-C  lamoTRIgine (LAMICTAL) 25 MG  tablet Take 1 tab (25 mg) in the morning.  Then take 3 tabs (75 mg) in the evening. 07/21/14   Adonis BrookSheila Agustin, NP  Multiple Vitamin (MULTIVITAMIN WITH MINERALS) TABS tablet Take 1 tablet by mouth daily. 07/21/14   Adonis BrookSheila Agustin, NP  naproxen (NAPROSYN) 500 MG tablet Take 1 tablet (500 mg total) by mouth 2 (two) times daily. 12/03/14   Burgess AmorJulie Idol, PA-C  nicotine (NICODERM CQ - DOSED IN MG/24 HOURS) 21 mg/24hr patch Place 1 patch (21 mg total) onto the skin daily. 07/21/14   Adonis BrookSheila Agustin, NP  propranolol (INDERAL) 20 MG tablet Take 1 tablet (20 mg total) by mouth 3 (three) times daily. 07/21/14   Adonis BrookSheila Agustin, NP  QUEtiapine (SEROQUEL) 300 MG tablet Take 1 tablet (300 mg total) by mouth  at bedtime. 07/21/14   Adonis BrookSheila Agustin, NP   BP 125/79 mmHg  Pulse 94  Temp(Src) 97.9 F (36.6 C) (Oral)  Resp 16  Ht 6\' 1"  (1.854 m)  Wt 99.791 kg  BMI 29.03 kg/m2  SpO2 100% Physical Exam  Constitutional: He is oriented to person, place, and time. He appears well-developed and well-nourished.  Non-toxic appearance.  HENT:  Head: Normocephalic.  Right Ear: Tympanic membrane and external ear normal.  Left Ear: Tympanic membrane and external ear normal.  Eyes: EOM and lids are normal. Pupils are equal, round, and reactive to light.  Neck: Normal range of motion. Neck supple. Carotid bruit is not present.  Cardiovascular: Normal rate, regular rhythm, normal heart sounds, intact distal pulses and normal pulses.   Pulmonary/Chest: Breath sounds normal. No respiratory distress.  Abdominal: Soft. Bowel sounds are normal. There is no tenderness. There is no guarding.  Musculoskeletal: Normal range of motion.       Right foot: There is tenderness. There is normal range of motion, normal capillary refill and no deformity.       Feet:  Lymphadenopathy:       Head (right side): No submandibular adenopathy present.       Head (left side): No submandibular adenopathy present.    He has no cervical adenopathy.  Neurological: He is alert and oriented to person, place, and time. He has normal strength. No cranial nerve deficit or sensory deficit.  Skin: Skin is warm and dry.  Psychiatric: He has a normal mood and affect. His speech is normal.  Nursing note and vitals reviewed.   ED Course  Procedures (including critical care time) Labs Review Labs Reviewed - No data to display  Imaging Review No results found. I have personally reviewed and evaluated these images and lab results as part of my medical decision-making.   EKG Interpretation None      MDM  The history and the exam suggest ankle sprain. Patient is fitted with an ankle stirrup splint. He is advised to apply ice and  elevate the ankle is much as possible. He will return if any changes, problems, or concerns.    Final diagnoses:  Ankle sprain, right, initial encounter    **I have reviewed nursing notes, vital signs, and all appropriate lab and imaging results for this patient.Ivery Quale*    Scotland Korver, PA-C 11/01/15 1441  Lavera Guiseana Duo Liu, MD 11/02/15 [phone]

## 2015-10-31 NOTE — Discharge Instructions (Signed)
Use the ankle splint for 7 to 10 days. Elevate your foot when possible. Use tylenol and ibuprofen for soreness. Ankle Sprain An ankle sprain is an injury to the strong, fibrous tissues (ligaments) that hold the bones of your ankle joint together.  CAUSES An ankle sprain is usually caused by a fall or by twisting your ankle. Ankle sprains most commonly occur when you step on the outer edge of your foot, and your ankle turns inward. People who participate in sports are more prone to these types of injuries.  SYMPTOMS   Pain in your ankle. The pain may be present at rest or only when you are trying to stand or walk.  Swelling.  Bruising. Bruising may develop immediately or within 1 to 2 days after your injury.  Difficulty standing or walking, particularly when turning corners or changing directions. DIAGNOSIS  Your caregiver will ask you details about your injury and perform a physical exam of your ankle to determine if you have an ankle sprain. During the physical exam, your caregiver will press on and apply pressure to specific areas of your foot and ankle. Your caregiver will try to move your ankle in certain ways. An X-ray exam may be done to be sure a bone was not broken or a ligament did not separate from one of the bones in your ankle (avulsion fracture).  TREATMENT  Certain types of braces can help stabilize your ankle. Your caregiver can make a recommendation for this. Your caregiver may recommend the use of medicine for pain. If your sprain is severe, your caregiver may refer you to a surgeon who helps to restore function to parts of your skeletal system (orthopedist) or a physical therapist. HOME CARE INSTRUCTIONS   Apply ice to your injury for 1-2 days or as directed by your caregiver. Applying ice helps to reduce inflammation and pain.  Put ice in a plastic bag.  Place a towel between your skin and the bag.  Leave the ice on for 15-20 minutes at a time, every 2 hours while you are  awake.  Only take over-the-counter or prescription medicines for pain, discomfort, or fever as directed by your caregiver.  Elevate your injured ankle above the level of your heart as much as possible for 2-3 days.  If your caregiver recommends crutches, use them as instructed. Gradually put weight on the affected ankle. Continue to use crutches or a cane until you can walk without feeling pain in your ankle.  If you have a plaster splint, wear the splint as directed by your caregiver. Do not rest it on anything harder than a pillow for the first 24 hours. Do not put weight on it. Do not get it wet. You may take it off to take a shower or bath.  You may have been given an elastic bandage to wear around your ankle to provide support. If the elastic bandage is too tight (you have numbness or tingling in your foot or your foot becomes cold and blue), adjust the bandage to make it comfortable.  If you have an air splint, you may blow more air into it or let air out to make it more comfortable. You may take your splint off at night and before taking a shower or bath. Wiggle your toes in the splint several times per day to decrease swelling. SEEK MEDICAL CARE IF:   You have rapidly increasing bruising or swelling.  Your toes feel extremely cold or you lose feeling in your foot.  Your pain is not relieved with medicine. SEEK IMMEDIATE MEDICAL CARE IF:  Your toes are numb or blue.  You have severe pain that is increasing. MAKE SURE YOU:   Understand these instructions.  Will watch your condition.  Will get help right away if you are not doing well or get worse.   This information is not intended to replace advice given to you by your health care provider. Make sure you discuss any questions you have with your health care provider.   Document Released: 04/15/2005 Document Revised: 05/06/2014 Document Reviewed: 04/27/2011 Elsevier Interactive Patient Education [employer]2016 [employer]vier Inc.

## 2015-11-15 ENCOUNTER — Encounter (HOSPITAL_COMMUNITY): Payer: Self-pay | Admitting: Emergency Medicine

## 2015-11-15 ENCOUNTER — Emergency Department (HOSPITAL_COMMUNITY)
Admission: EM | Admit: 2015-11-15 | Discharge: 2015-11-15 | Disposition: A | Discharge status: Home / Self Care | Payer: Medicare Other | Attending: Emergency Medicine | Admitting: Emergency Medicine

## 2015-11-15 ENCOUNTER — Emergency Department (HOSPITAL_COMMUNITY): Payer: Medicare Other

## 2015-11-15 DIAGNOSIS — Y939 Activity, unspecified: Secondary | ICD-10-CM | POA: Diagnosis not present

## 2015-11-15 DIAGNOSIS — X501XXA Overexertion from prolonged static or awkward postures, initial encounter: Secondary | ICD-10-CM | POA: Insufficient documentation

## 2015-11-15 DIAGNOSIS — Y999 Unspecified external cause status: Secondary | ICD-10-CM | POA: Insufficient documentation

## 2015-11-15 DIAGNOSIS — F209 Schizophrenia, unspecified: Secondary | ICD-10-CM | POA: Diagnosis not present

## 2015-11-15 DIAGNOSIS — Z79899 Other long term (current) drug therapy: Secondary | ICD-10-CM | POA: Diagnosis not present

## 2015-11-15 DIAGNOSIS — F17210 Nicotine dependence, cigarettes, uncomplicated: Secondary | ICD-10-CM | POA: Diagnosis not present

## 2015-11-15 DIAGNOSIS — Y929 Unspecified place or not applicable: Secondary | ICD-10-CM | POA: Diagnosis not present

## 2015-11-15 DIAGNOSIS — S92352A Displaced fracture of fifth metatarsal bone, left foot, initial encounter for closed fracture: Secondary | ICD-10-CM | POA: Diagnosis not present

## 2015-11-15 DIAGNOSIS — S92351A Displaced fracture of fifth metatarsal bone, right foot, initial encounter for closed fracture: Secondary | ICD-10-CM | POA: Diagnosis not present

## 2015-11-15 DIAGNOSIS — S92341A Displaced fracture of fourth metatarsal bone, right foot, initial encounter for closed fracture: Secondary | ICD-10-CM | POA: Diagnosis not present

## 2015-11-15 DIAGNOSIS — T148XXA Other injury of unspecified body region, initial encounter: Secondary | ICD-10-CM

## 2015-11-15 DIAGNOSIS — M25571 Pain in right ankle and joints of right foot: Secondary | ICD-10-CM | POA: Diagnosis present

## 2015-11-15 DIAGNOSIS — S80811A Abrasion, right lower leg, initial encounter: Secondary | ICD-10-CM | POA: Insufficient documentation

## 2015-11-15 DIAGNOSIS — Z791 Long term (current) use of non-steroidal anti-inflammatories (NSAID): Secondary | ICD-10-CM | POA: Insufficient documentation

## 2015-11-15 DIAGNOSIS — F319 Bipolar disorder, unspecified: Secondary | ICD-10-CM | POA: Diagnosis not present

## 2015-11-15 DIAGNOSIS — S92301A Fracture of unspecified metatarsal bone(s), right foot, initial encounter for closed fracture: Secondary | ICD-10-CM

## 2015-11-15 DIAGNOSIS — E785 Hyperlipidemia, unspecified: Secondary | ICD-10-CM | POA: Insufficient documentation

## 2015-11-15 DIAGNOSIS — S92344A Nondisplaced fracture of fourth metatarsal bone, right foot, initial encounter for closed fracture: Secondary | ICD-10-CM | POA: Diagnosis not present

## 2015-11-15 NOTE — ED Provider Notes (Signed)
CSN: [id number]     Arrival date & time 11/15/15  1932 History   First MD Initiated Contact with Patient 11/15/15 2002     Chief Complaint  Patient presents with  . Insect Bite   HPI   Patient reports that approximately week and half ago he rolled his ankle causing pain to the lateral aspect. Patient reports he had swelling since the incident, was given and ankles brace here in the ED and discharged home with symptomatic care instructions. Patient reports that he has had normal pain but continued to have swelling, he reports some pain today after extensive ambulation on the ankle. Patient also reports minor pain to the proximal lateral aspect of his right foot. Patient notes that after walking today with high boots and his pants tucked in he has abrasions and blisters to the posterior aspect of his lower extremity. He denies any surrounding redness, discharge, bleeding. Patient denies decreased range of motion of the foot or ankle, sensation intact. Patient denies any fevers.   Past Medical History  Diagnosis Date  . Paranoid schizophrenia (HCC)   . Hyperlipidemia   . Schizophrenia (HCC)   . Bipolar disorder [facility])    Past Surgical History  Procedure Laterality Date  . Testicle surgery     Family History  Problem Relation Age of Onset  . Schizophrenia Neg Hx    Social History  Substance Use Topics  . Smoking status: Current Every Day Smoker -- 1.00 packs/day    Types: Cigarettes  . Smokeless tobacco: None  . Alcohol Use: No    Review of Systems  All other systems reviewed and are negative.   Allergies  Other  Home Medications   Prior to Admission medications   Medication Sig Start Date End Date Taking? Authorizing Provider  atorvastatin (LIPITOR) 20 MG tablet Take 1 tablet (20 mg total) by mouth daily. 07/21/14   Adonis Brook, NP  benztropine (COGENTIN) 1 MG tablet Take 1 tablet (1 mg total) by mouth 2 (two) times daily in the am and at bedtime.. 07/21/14   Adonis Brook, NP  cyclobenzaprine (FLEXERIL) 10 MG tablet Take 1 tablet (10 mg total) by mouth 2 (two) times daily as needed for muscle spasms. 11/28/14   Hope Orlene Och, NP  fluPHENAZine (PROLIXIN) 5 MG tablet Take 2 tabs (10 mg) in the am.  Then taken 3 tabs (15 mg) in the evening. Patient taking differently: Take 10-15 mg by mouth 2 (two) times daily. Take 2 tabs (10 mg) in the am.  Then taken 3 tabs (15 mg) in the evening. 07/21/14   Adonis Brook, NP  fluPHENAZine decanoate (PROLIXIN) 25 MG/ML injection Inject 1 mL (25 mg total) into the muscle every 14 (fourteen) days. Last given on 07/20/2014.  NEXT dose due on 08/02/2014 Patient taking differently: Inject 25 mg into the muscle every 30 (thirty) days. Last given on 07/20/2014.  NEXT dose due on 08/02/2014 08/02/14   Adonis Brook, NP  HYDROcodone-acetaminophen (NORCO/VICODIN) 5-325 MG per tablet Take 1 tablet by mouth every 4 (four) hours as needed. 11/28/14   Hope Orlene Och, NP  hydrOXYzine (ATARAX/VISTARIL) 25 MG tablet Take 1 tablet (25 mg total) by mouth 3 (three) times daily as needed for anxiety (anxiety and sleep). 07/21/14   Adonis Brook, NP  ibuprofen (ADVIL,MOTRIN) 600 MG tablet Take 1 tablet (600 mg total) by mouth every 6 (six) hours as needed. 04/10/15   Burgess Amor, PA-C  lamoTRIgine (LAMICTAL) 25 MG tablet Take 1 tab (25  mg) in the morning.  Then take 3 tabs (75 mg) in the evening. 07/21/14   Adonis Brook, NP  Multiple Vitamin (MULTIVITAMIN WITH MINERALS) TABS tablet Take 1 tablet by mouth daily. 07/21/14   Adonis Brook, NP  naproxen (NAPROSYN) 500 MG tablet Take 1 tablet (500 mg total) by mouth 2 (two) times daily. 12/03/14   Burgess Amor, PA-C  nicotine (NICODERM CQ - DOSED IN MG/24 HOURS) 21 mg/24hr patch Place 1 patch (21 mg total) onto the skin daily. 07/21/14   Adonis Brook, NP  propranolol (INDERAL) 20 MG tablet Take 1 tablet (20 mg total) by mouth 3 (three) times daily. 07/21/14   Adonis Brook, NP  QUEtiapine (SEROQUEL) 300 MG tablet Take 1  tablet (300 mg total) by mouth at bedtime. 07/21/14   Adonis Brook, NP   BP 126/85 mmHg  Pulse 84  Temp(Src) 98.9 F (37.2 C) (Oral)  Resp 20  Ht 6\' 1"  (1.854 m)  Wt 99.791 kg  BMI 29.03 kg/m2  SpO2 100%    Physical Exam  Constitutional: He is oriented to person, place, and time. He appears well-developed and well-nourished.  HENT:  Head: Normocephalic and atraumatic.  Eyes: Conjunctivae are normal. Pupils are equal, round, and reactive to light. Right eye exhibits no discharge. Left eye exhibits no discharge. No scleral icterus.  Neck: Normal range of motion. No JVD present. No tracheal deviation present.  Pulmonary/Chest: Effort normal. No stridor.  Musculoskeletal:  Swelling to the right ankle, tenderness palpation of lateral aspect and proximal foot, no redness, warmth such, pedal pulses 2+, sensation intact. Small blisters and abrasions noted in the linear pattern along the posterior right lower extremity, no signs of surrounding cellulitis  Neurological: He is alert and oriented to person, place, and time. Coordination normal.  Psychiatric: He has a normal mood and affect. His behavior is normal. Judgment and thought content normal.  Nursing note and vitals reviewed.   ED Course  Procedures (including critical care time) Labs Review Labs Reviewed - No data to display  Imaging Review Dg Ankle Complete Right  11/15/2015  CLINICAL DATA:  Swelling and pain to the lateral foot for couple of weeks. History of injury 1 year ago while dancing. Initial encounter. EXAM: RIGHT ANKLE - COMPLETE 3+ VIEW COMPARISON:  None. FINDINGS: Nonspecific subcutaneous reticulation/swelling. Negative for ankle fracture or subluxation. Metatarsal fractures described separately. IMPRESSION: 1. Negative right ankle. 2. Metatarsal fractures described separately. Electronically Signed   By: Marnee Spring M.D.   On: 11/15/2015 21:07   Dg Foot Complete Right  11/15/2015  CLINICAL DATA:  Swelling and  pain to the lateral foot for couple of weeks. History of injury 1 year ago while dancing. Initial encounter. EXAM: RIGHT FOOT COMPLETE - 3+ VIEW COMPARISON:  12/03/2014 FINDINGS: Proximal fifth and fourth metatarsal shaft fractures with associated sclerosis and fourth metatarsal mature periosteal reaction. Given the history, suspect pre-existing stress injury. IMPRESSION: Nondisplaced fourth and fifth metatarsal shaft fractures. Appearance suggests subacute injury or stress fractures. Electronically Signed   By: Marnee Spring M.D.   On: 11/15/2015 21:09   I have personally reviewed and evaluated these images and lab results as part of my medical decision-making.   EKG Interpretation None      MDM   Final diagnoses:  Metatarsal bone fracture, right, closed, initial encounter  Skin abrasion    Labs:   Imaging: DG foot complete, DG ankle  Consults:  Therapeutics:  Discharge Meds:   Assessment/Plan: 41 year old male with fractures to his  right foot. Patient has swelling, no signs of compartment syndrome, is ambulating without difficulty. Patient's initial presentation was for the small blisters and skin abrasions from his pants rubbing on the back of his leg due to the swelling. He'll be instructed to use Neosporin for this, rest, postop shoe, follow-up with orthopedics for further evaluation and management. Patient verbalized understanding and agreement to today's plan and had no further questions or concerns at the time of discharge        Eyvonne MechanicJeffrey Ulyana Pitones, PA-C 11/15/15 2139  Eber HongBrian Miller, MD 11/17/15 [phone]

## 2015-11-15 NOTE — Discharge Instructions (Signed)
Please use ice, ibuprofen, rest for fractures, please follow-up with orthopedic specialist for reevaluation further management.  Metatarsal Fracture A metatarsal fracture is a break in a metatarsal bone. Metatarsal bones connect your toe bones to your ankle bones. CAUSES This type of fracture may be caused by:  A sudden twisting of your foot.  A fall onto your foot.  Overuse or repetitive exercise. RISK FACTORS This condition is more likely to develop in people who:  Play contact sports.  Have a bone disease.  Have a low calcium level. SYMPTOMS Symptoms of this condition include:  Pain that is worse when walking or standing.  Pain when pressing on the foot or moving the toes.  Swelling.  Bruising on the top or bottom of the foot.  A foot that appears shorter than the other one. DIAGNOSIS This condition is diagnosed with a physical exam. You may also have imaging tests, such as:  X-rays.  A CT scan.  MRI. TREATMENT Treatment for this condition depends on its severity and whether a bone has moved out of place. Treatment may involve:  Rest.  Wearing foot support such as a cast, splint, or boot for several weeks.  Using crutches.  Surgery to move bones back into the right position. Surgery is usually needed if there are many pieces of broken bone or bones that are very out of place (displaced fracture).  Physical therapy. This may be needed to help you regain full movement and strength in your foot. You will need to return to your health care provider to have X-rays taken until your bones heal. Your health care provider will look at the X-rays to make sure that your foot is healing well. HOME CARE INSTRUCTIONS  If You Have a Cast:  Do not stick anything inside the cast to scratch your skin. Doing that increases your risk of infection.  Check the skin around the cast every day. Report any concerns to your health care provider. You may put lotion on dry skin around  the edges of the cast. Do not apply lotion to the skin underneath the cast.  Keep the cast clean and dry. If You Have a Splint or a Supportive Boot:  Wear it as directed by your health care provider. Remove it only as directed by your health care provider.  Loosen it if your toes become numb and tingle, or if they turn cold and blue.  Keep it clean and dry. Bathing  Do not take baths, swim, or use a hot tub until your health care provider approves. Ask your health care provider if you can take showers. You may only be allowed to take sponge baths for bathing.  If your health care provider approves bathing and showering, cover the cast or splint with a watertight plastic bag to protect it from water. Do not let the cast or splint get wet. Managing Pain, Stiffness, and Swelling  If directed, apply ice to the injured area (if you have a splint, not a cast).  Put ice in a plastic bag.  Place a towel between your skin and the bag.  Leave the ice on for 20 minutes, 2-3 times per day.  Move your toes often to avoid stiffness and to lessen swelling.  Raise (elevate) the injured area above the level of your heart while you are sitting or lying down. Driving  Do not drive or operate heavy machinery while taking pain medicine.  Do not drive while wearing foot support on a foot that  you use for driving. Activity  Return to your normal activities as directed by your health care provider. Ask your health care provider what activities are safe for you.  Perform exercises as directed by your health care provider or physical therapist. Safety  Do not use the injured foot to support your body weight until your health care provider says that you can. Use crutches as directed by your health care provider. General Instructions  Do not put pressure on any part of the cast or splint until it is fully hardened. This may take several hours.  Do not use any tobacco products, including cigarettes,  chewing tobacco, or e-cigarettes. Tobacco can delay bone healing. If you need help quitting, ask your health care provider.  Take medicines only as directed by your health care provider.  Keep all follow-up visits as directed by your health care provider. This is important. SEEK MEDICAL CARE IF:  You have a fever.  Your cast, splint, or boot is too loose or too tight.  Your cast, splint, or boot is damaged.  Your pain medicine is not helping.  You have pain, tingling, or numbness in your foot that is not going away. SEEK IMMEDIATE MEDICAL CARE IF:  You have severe pain.  You have tingling or numbness in your foot that is getting worse.  Your foot feels cold or becomes numb.  Your foot changes color.   This information is not intended to replace advice given to you by your health care provider. Make sure you discuss any questions you have with your health care provider.   Document Released: 01/05/2002 Document Revised: 08/30/2014 Document Reviewed: 02/09/2014 Elsevier Interactive Patient Education [employer] Elsevier Inc.

## 2015-11-15 NOTE — ED Notes (Signed)
Pt states he thinks he was bitten by a spider on both ankles and here for swelling to both ankles.

## 2015-11-17 ENCOUNTER — Encounter (HOSPITAL_COMMUNITY): Payer: Self-pay | Admitting: *Deleted

## 2015-11-17 ENCOUNTER — Emergency Department (HOSPITAL_COMMUNITY)
Admission: EM | Admit: 2015-11-17 | Discharge: 2015-11-18 | Disposition: A | Discharge status: Home / Self Care | Payer: Medicare Other | Attending: Emergency Medicine | Admitting: Emergency Medicine

## 2015-11-17 DIAGNOSIS — Y939 Activity, unspecified: Secondary | ICD-10-CM | POA: Diagnosis not present

## 2015-11-17 DIAGNOSIS — X58XXXA Exposure to other specified factors, initial encounter: Secondary | ICD-10-CM | POA: Insufficient documentation

## 2015-11-17 DIAGNOSIS — L03115 Cellulitis of right lower limb: Secondary | ICD-10-CM | POA: Diagnosis not present

## 2015-11-17 DIAGNOSIS — Y929 Unspecified place or not applicable: Secondary | ICD-10-CM | POA: Insufficient documentation

## 2015-11-17 DIAGNOSIS — Z79899 Other long term (current) drug therapy: Secondary | ICD-10-CM | POA: Insufficient documentation

## 2015-11-17 DIAGNOSIS — Y999 Unspecified external cause status: Secondary | ICD-10-CM | POA: Insufficient documentation

## 2015-11-17 DIAGNOSIS — F17210 Nicotine dependence, cigarettes, uncomplicated: Secondary | ICD-10-CM | POA: Diagnosis not present

## 2015-11-17 DIAGNOSIS — S92341D Displaced fracture of fourth metatarsal bone, right foot, subsequent encounter for fracture with routine healing: Secondary | ICD-10-CM | POA: Diagnosis not present

## 2015-11-17 DIAGNOSIS — F200 Paranoid schizophrenia: Secondary | ICD-10-CM | POA: Insufficient documentation

## 2015-11-17 DIAGNOSIS — S92341A Displaced fracture of fourth metatarsal bone, right foot, initial encounter for closed fracture: Secondary | ICD-10-CM | POA: Diagnosis not present

## 2015-11-17 DIAGNOSIS — S92351A Displaced fracture of fifth metatarsal bone, right foot, initial encounter for closed fracture: Secondary | ICD-10-CM | POA: Insufficient documentation

## 2015-11-17 DIAGNOSIS — S92301A Fracture of unspecified metatarsal bone(s), right foot, initial encounter for closed fracture: Secondary | ICD-10-CM

## 2015-11-17 DIAGNOSIS — S92351D Displaced fracture of fifth metatarsal bone, right foot, subsequent encounter for fracture with routine healing: Secondary | ICD-10-CM | POA: Diagnosis not present

## 2015-11-17 DIAGNOSIS — E785 Hyperlipidemia, unspecified: Secondary | ICD-10-CM | POA: Diagnosis not present

## 2015-11-17 DIAGNOSIS — M7989 Other specified soft tissue disorders: Secondary | ICD-10-CM | POA: Diagnosis present

## 2015-11-17 MED ORDER — SULFAMETHOXAZOLE-TRIMETHOPRIM 800-160 MG PO TABS: 1.0000 | ORAL_TABLET | Freq: Two times a day (BID) | ORAL | Status: AC

## 2015-11-17 MED ORDER — SULFAMETHOXAZOLE-TRIMETHOPRIM 800-160 MG PO TABS
1.0000 | ORAL_TABLET | Freq: Once | ORAL | Status: AC
  Administered 2015-11-17: 1 via ORAL
  Filled 2015-11-17: qty 1

## 2015-11-17 NOTE — Discharge Instructions (Signed)
Cellulitis Cellulitis is an infection of the skin and the tissue beneath it. The infected area is usually red and tender. Cellulitis occurs most often in the arms and lower legs.  CAUSES  Cellulitis is caused by bacteria that enter the skin through cracks or cuts in the skin. The most common types of bacteria that cause cellulitis are staphylococci and streptococci. SIGNS AND SYMPTOMS   Redness and warmth.  Swelling.  Tenderness or pain.  Fever. DIAGNOSIS  Your health care provider can usually determine what is wrong based on a physical exam. Blood tests may also be done. TREATMENT  Treatment usually involves taking an antibiotic medicine. HOME CARE INSTRUCTIONS   Take your antibiotic medicine as directed by your health care provider. Finish the antibiotic even if you start to feel better.  Keep the infected arm or leg elevated to reduce swelling.  Apply a warm cloth to the affected area up to 4 times per day to relieve pain.  Take medicines only as directed by your health care provider.  Keep all follow-up visits as directed by your health care provider. SEEK MEDICAL CARE IF:   You notice red streaks coming from the infected area.  Your red area gets larger or turns dark in color.  Your bone or joint underneath the infected area becomes painful after the skin has healed.  Your infection returns in the same area or another area.  You notice a swollen bump in the infected area.  You develop new symptoms.  You have a fever. SEEK IMMEDIATE MEDICAL CARE IF:   You feel very sleepy.  You develop vomiting or diarrhea.  You have a general ill feeling (malaise) with muscle aches and pains.   This information is not intended to replace advice given to you by your health care provider. Make sure you discuss any questions you have with your health care provider.   Document Released: 01/23/2005 Document Revised: 01/04/2015 Document Reviewed: 07/01/2011 Elsevier Interactive  Patient Education 2016 Else[employer] Inc.  Metatarsal Fracture A metatarsal fracture is a break in a metatarsal bone. Metatarsal bones connect your toe bones to your ankle bones. CAUSES This type of fracture may be caused by:  A sudden twisting of your foot.  A fall onto your foot.  Overuse or repetitive exercise. RISK FACTORS This condition is more likely to develop in people who:  Play contact sports.  Have a bone disease.  Have a low calcium level. SYMPTOMS Symptoms of this condition include:  Pain that is worse when walking or standing.  Pain when pressing on the foot or moving the toes.  Swelling.  Bruising on the top or bottom of the foot.  A foot that appears shorter than the other one. DIAGNOSIS This condition is diagnosed with a physical exam. You may also have imaging tests, such as:  X-rays.  A CT scan.  MRI. TREATMENT Treatment for this condition depends on its severity and whether a bone has moved out of place. Treatment may involve:  Rest.  Wearing foot support such as a cast, splint, or boot for several weeks.  Using crutches.  Surgery to move bones back into the right position. Surgery is usually needed if there are many pieces of broken bone or bones that are very out of place (displaced fracture).  Physical therapy. This may be needed to help you regain full movement and strength in your foot. You will need to return to your health care provider to have X-rays taken until your bones heal.  Your health care provider will look at the X-rays to make sure that your foot is healing well. HOME CARE INSTRUCTIONS  If You Have a Cast:  Do not stick anything inside the cast to scratch your skin. Doing that increases your risk of infection.  Check the skin around the cast every day. Report any concerns to your health care provider. You may put lotion on dry skin around the edges of the cast. Do not apply lotion to the skin underneath the cast.  Keep the  cast clean and dry. If You Have a Splint or a Supportive Boot:  Wear it as directed by your health care provider. Remove it only as directed by your health care provider.  Loosen it if your toes become numb and tingle, or if they turn cold and blue.  Keep it clean and dry. Bathing  Do not take baths, swim, or use a hot tub until your health care provider approves. Ask your health care provider if you can take showers. You may only be allowed to take sponge baths for bathing.  If your health care provider approves bathing and showering, cover the cast or splint with a watertight plastic bag to protect it from water. Do not let the cast or splint get wet. Managing Pain, Stiffness, and Swelling  If directed, apply ice to the injured area (if you have a splint, not a cast).  Put ice in a plastic bag.  Place a towel between your skin and the bag.  Leave the ice on for 20 minutes, 2-3 times per day.  Move your toes often to avoid stiffness and to lessen swelling.  Raise (elevate) the injured area above the level of your heart while you are sitting or lying down. Driving  Do not drive or operate heavy machinery while taking pain medicine.  Do not drive while wearing foot support on a foot that you use for driving. Activity  Return to your normal activities as directed by your health care provider. Ask your health care provider what activities are safe for you.  Perform exercises as directed by your health care provider or physical therapist. Safety  Do not use the injured foot to support your body weight until your health care provider says that you can. Use crutches as directed by your health care provider. General Instructions  Do not put pressure on any part of the cast or splint until it is fully hardened. This may take several hours.  Do not use any tobacco products, including cigarettes, chewing tobacco, or e-cigarettes. Tobacco can delay bone healing. If you need help  quitting, ask your health care provider.  Take medicines only as directed by your health care provider.  Keep all follow-up visits as directed by your health care provider. This is important. SEEK MEDICAL CARE IF:  You have a fever.  Your cast, splint, or boot is too loose or too tight.  Your cast, splint, or boot is damaged.  Your pain medicine is not helping.  You have pain, tingling, or numbness in your foot that is not going away. SEEK IMMEDIATE MEDICAL CARE IF:  You have severe pain.  You have tingling or numbness in your foot that is getting worse.  Your foot feels cold or becomes numb.  Your foot changes color.   This information is not intended to replace advice given to you by your health care provider. Make sure you discuss any questions you have with your health care provider.   Document  Released: 01/05/2002 Document Revised: 08/30/2014 Document Reviewed: 02/09/2014 Elsevier Interactive Patient Education [employer] Elsevier Inc.

## 2015-11-17 NOTE — ED Notes (Signed)
Pt c/o bilateral ankle and leg swelling. Pt does have blisters on his right lower leg.

## 2015-11-17 NOTE — ED Provider Notes (Signed)
CSN: [id number]     Arrival date & time 11/17/15  2119 History   First MD Initiated Contact with Patient 11/17/15 2233     Chief Complaint  Patient presents with  . Leg Swelling     (Consider location/radiation/quality/duration/timing/severity/associated sxs/prior Treatment) The history is provided by the patient.   [name] is a 40 y.o. male with his now third visit here in the past 2 weeks for ongoing right foot and lower extremity pain and now swelling.  He ambulates everywhere he goes but has minimized walking the past several days due to his symptoms.  He was originally diagnosed with an ankle sprain and was fitted in an aso.  He is not wearing it because he could not figure out how to put it on once he took it off.  He returned here 2 Days ago due to increased ankle swelling along with blisters on his posterior lower leg thought to be due from rubbing of his pants which he wore tucked into some high boots several days ago.  During that visit a foot x-ray revealed right fourth and fifth proximal metatarsal fractures.  He was placed in a postop shoe which he is not currently wearing as it makes it difficult for him to walk.  He presents in his tennis shoes this evening.  He has complaints of increased pain, now redness along with edema of the right foot ankle and distal lower extremity.  He denies fevers or chills, no other complaints.  He has taken ibuprofen 800 mg with moderate improvement of pain symptoms.  His last dose of this medication was this morning.    Past Medical History  Diagnosis Date  . Paranoid schizophrenia (HCC)   . Hyperlipidemia   . Schizophrenia (HCC)   . Bipolar disorder [facility])    Past Surgical History  Procedure Laterality Date  . Testicle surgery     Family History  Problem Relation Age of Onset  . Schizophrenia Neg Hx    Social History  Substance Use Topics  . Smoking status: Current Every Day Smoker -- 1.00 packs/day    Types: Cigarettes  .  Smokeless tobacco: None  . Alcohol Use: No    Review of Systems  Constitutional: Negative for fever and chills.  Musculoskeletal: Positive for joint swelling and arthralgias. Negative for myalgias.  Skin: Positive for color change and wound.  Neurological: Negative for weakness and numbness.      Allergies  Other  Home Medications   Prior to Admission medications   Medication Sig Start Date End Date Taking? Authorizing Provider  atorvastatin (LIPITOR) 20 MG tablet Take 1 tablet (20 mg total) by mouth daily. 07/21/14   Adonis Brook, NP  benztropine (COGENTIN) 1 MG tablet Take 1 tablet (1 mg total) by mouth 2 (two) times daily in the am and at bedtime.. 07/21/14   Adonis Brook, NP  cyclobenzaprine (FLEXERIL) 10 MG tablet Take 1 tablet (10 mg total) by mouth 2 (two) times daily as needed for muscle spasms. 11/28/14   Hope Orlene Och, NP  fluPHENAZine (PROLIXIN) 5 MG tablet Take 2 tabs (10 mg) in the am.  Then taken 3 tabs (15 mg) in the evening. Patient taking differently: Take 10-15 mg by mouth 2 (two) times daily. Take 2 tabs (10 mg) in the am.  Then taken 3 tabs (15 mg) in the evening. 07/21/14   Adonis Brook, NP  fluPHENAZine decanoate (PROLIXIN) 25 MG/ML injection Inject 1 mL (25 mg total) into the muscle  every 14 (fourteen) days. Last given on 07/20/2014.  NEXT dose due on 08/02/2014 Patient taking differently: Inject 25 mg into the muscle every 30 (thirty) days. Last given on 07/20/2014.  NEXT dose due on 08/02/2014 08/02/14   Adonis BrookSheila Agustin, NP  HYDROcodone-acetaminophen (NORCO/VICODIN) 5-325 MG per tablet Take 1 tablet by mouth every 4 (four) hours as needed. 11/28/14   Hope Orlene OchM Neese, NP  hydrOXYzine (ATARAX/VISTARIL) 25 MG tablet Take 1 tablet (25 mg total) by mouth 3 (three) times daily as needed for anxiety (anxiety and sleep). 07/21/14   Adonis BrookSheila Agustin, NP  ibuprofen (ADVIL,MOTRIN) 600 MG tablet Take 1 tablet (600 mg total) by mouth every 6 (six) hours as needed. 04/10/15   Burgess AmorJulie Shane Badeaux, PA-C    lamoTRIgine (LAMICTAL) 25 MG tablet Take 1 tab (25 mg) in the morning.  Then take 3 tabs (75 mg) in the evening. 07/21/14   Adonis BrookSheila Agustin, NP  Multiple Vitamin (MULTIVITAMIN WITH MINERALS) TABS tablet Take 1 tablet by mouth daily. 07/21/14   Adonis BrookSheila Agustin, NP  naproxen (NAPROSYN) 500 MG tablet Take 1 tablet (500 mg total) by mouth 2 (two) times daily. 12/03/14   Burgess AmorJulie Cicilia Clinger, PA-C  nicotine (NICODERM CQ - DOSED IN MG/24 HOURS) 21 mg/24hr patch Place 1 patch (21 mg total) onto the skin daily. 07/21/14   Adonis BrookSheila Agustin, NP  propranolol (INDERAL) 20 MG tablet Take 1 tablet (20 mg total) by mouth 3 (three) times daily. 07/21/14   Adonis BrookSheila Agustin, NP  QUEtiapine (SEROQUEL) 300 MG tablet Take 1 tablet (300 mg total) by mouth at bedtime. 07/21/14   Adonis BrookSheila Agustin, NP  sulfamethoxazole-trimethoprim (BACTRIM DS,SEPTRA DS) 800-160 MG tablet Take 1 tablet by mouth 2 (two) times daily. 11/17/15 11/24/15  Burgess AmorJulie Oshea Percival, PA-C   BP 136/80 mmHg  Pulse 88  Temp(Src) 98.7 F (37.1 C) (Oral)  Resp 14  Ht 6\' 1"  (1.854 m)  Wt 99.791 kg  BMI 29.03 kg/m2  SpO2 100% Physical Exam  Constitutional: He appears well-developed and well-nourished.  HENT:  Head: Atraumatic.  Neck: Normal range of motion.  Cardiovascular:  Pulses equal bilaterally  Musculoskeletal: He exhibits edema and tenderness.       Right foot: There is bony tenderness and swelling. There is normal range of motion, normal capillary refill and no deformity.       Feet:  ttp right lateral midfoot with moderate warm edema and skin erythema to distal anterior lower right leg.  Nonpitting edema.  Several intact small blister right posterior lower leg.  Calf soft and nontender without cords or nodular lesions.   Neurological: He is alert. He has normal strength. He displays normal reflexes. No sensory deficit.  Skin: Skin is warm and dry.  Psychiatric: He has a normal mood and affect.    ED Course  Procedures (including critical care time) Labs Review Labs  Reviewed - No data to display  Imaging Review No results found. I have personally reviewed and evaluated these images and lab results as part of my medical decision-making.   EKG Interpretation None      MDM   Final diagnoses:  Multiple closed fractures of metatarsal bone, right, initial encounter  Cellulitis of right lower extremity    Images reviewed from last visit which were discussed with pt and need to protect his foot while his injury heals.  Emphasized that he needs to be as non weight bearing as possible and he needs to protect the bones from movement so they can heal.  Pt seems to  understand this.  He will be placed in a cam walker which I think he will be more compliant with. He states he has crutches and will use.  Cannot rule out cellulitis of leg given increased warmth and  given recent abrasions and blisters, although blisters still remain intact. Will start on bactrim with first dose given here.  Wells criteria low risk for dvt, doubt this possibility.  Referral to Dr. Romeo Apple for f/u care of his injury.    Burgess Amor, PA-C 11/18/15 0117  Samuel Jester, DO 11/18/15 2200

## 2015-11-18 DIAGNOSIS — S92351A Displaced fracture of fifth metatarsal bone, right foot, initial encounter for closed fracture: Secondary | ICD-10-CM | POA: Diagnosis not present

## 2015-11-27 DIAGNOSIS — Z79899 Other long term (current) drug therapy: Secondary | ICD-10-CM | POA: Diagnosis not present

## 2015-11-27 DIAGNOSIS — F2081 Schizophreniform disorder: Secondary | ICD-10-CM | POA: Diagnosis not present

## 2015-11-27 DIAGNOSIS — E782 Mixed hyperlipidemia: Secondary | ICD-10-CM | POA: Diagnosis not present

## 2015-11-27 DIAGNOSIS — L03818 Cellulitis of other sites: Secondary | ICD-10-CM | POA: Diagnosis not present

## 2015-12-04 DIAGNOSIS — L03818 Cellulitis of other sites: Secondary | ICD-10-CM | POA: Diagnosis not present

## 2016-01-03 DIAGNOSIS — R600 Localized edema: Secondary | ICD-10-CM | POA: Diagnosis not present

## 2016-03-12 DIAGNOSIS — Z23 Encounter for immunization: Secondary | ICD-10-CM | POA: Diagnosis not present

## 2016-06-18 DIAGNOSIS — Z79899 Other long term (current) drug therapy: Secondary | ICD-10-CM | POA: Diagnosis not present

## 2017-11-20 ENCOUNTER — Encounter (HOSPITAL_COMMUNITY): Payer: Self-pay

## 2017-11-20 ENCOUNTER — Other Ambulatory Visit: Payer: Self-pay

## 2017-11-20 ENCOUNTER — Emergency Department (HOSPITAL_COMMUNITY)
Admission: EM | Admit: 2017-11-20 | Discharge: 2017-11-20 | Disposition: A | Discharge status: Home / Self Care | Payer: Medicare Other | Attending: Emergency Medicine | Admitting: Emergency Medicine

## 2017-11-20 DIAGNOSIS — R202 Paresthesia of skin: Secondary | ICD-10-CM | POA: Insufficient documentation

## 2017-11-20 DIAGNOSIS — Z5321 Procedure and treatment not carried out due to patient leaving prior to being seen by health care provider: Secondary | ICD-10-CM | POA: Insufficient documentation
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
## 2017-11-20 NOTE — ED Notes (Signed)
Pt advised reg he was leaving

## 2017-11-21 ENCOUNTER — Encounter (HOSPITAL_COMMUNITY): Payer: Self-pay | Admitting: Emergency Medicine

## 2017-11-21 ENCOUNTER — Emergency Department (HOSPITAL_COMMUNITY)
Admission: EM | Admit: 2017-11-21 | Discharge: 2017-11-21 | Disposition: A | Discharge status: Home / Self Care | Payer: Medicare Other | Attending: Emergency Medicine | Admitting: Emergency Medicine

## 2017-11-21 DIAGNOSIS — Z0283 Encounter for blood-alcohol and blood-drug test: Secondary | ICD-10-CM

## 2017-11-21 DIAGNOSIS — R200 Anesthesia of skin: Secondary | ICD-10-CM | POA: Diagnosis present

## 2017-11-21 DIAGNOSIS — F17210 Nicotine dependence, cigarettes, uncomplicated: Secondary | ICD-10-CM | POA: Insufficient documentation

## 2017-11-21 DIAGNOSIS — R202 Paresthesia of skin: Secondary | ICD-10-CM | POA: Diagnosis not present

## 2017-11-21 DIAGNOSIS — Z79899 Other long term (current) drug therapy: Secondary | ICD-10-CM | POA: Insufficient documentation

## 2017-11-21 LAB — RAPID URINE DRUG SCREEN, HOSP PERFORMED
Amphetamines: NOT DETECTED
BARBITURATES: NOT DETECTED
Benzodiazepines: NOT DETECTED
Cocaine: NOT DETECTED
OPIATES: NOT DETECTED
TETRAHYDROCANNABINOL: NOT DETECTED

## 2017-11-21 NOTE — Discharge Instructions (Signed)
Drug screen was clear today, I cannot tell you exactly what was in the cigarette you consumed yesterday but I am reassured that her symptoms have resolved.  Please follow-up with your primary care doctor.  Return for any new or concerning symptoms.
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
## 2017-11-24 ENCOUNTER — Emergency Department (HOSPITAL_COMMUNITY)
Admission: EM | Admit: 2017-11-24 | Discharge: 2017-11-24 | Discharge status: Against Medical Advice | Payer: Medicare Other | Attending: Emergency Medicine | Admitting: Emergency Medicine

## 2017-11-24 ENCOUNTER — Encounter (HOSPITAL_COMMUNITY): Payer: Self-pay

## 2017-11-24 DIAGNOSIS — F17210 Nicotine dependence, cigarettes, uncomplicated: Secondary | ICD-10-CM | POA: Insufficient documentation

## 2017-11-24 DIAGNOSIS — F22 Delusional disorders: Secondary | ICD-10-CM

## 2017-11-24 DIAGNOSIS — Z139 Encounter for screening, unspecified: Secondary | ICD-10-CM

## 2017-11-24 DIAGNOSIS — F319 Bipolar disorder, unspecified: Secondary | ICD-10-CM | POA: Insufficient documentation

## 2017-11-24 DIAGNOSIS — Z5321 Procedure and treatment not carried out due to patient leaving prior to being seen by health care provider: Secondary | ICD-10-CM | POA: Diagnosis not present

## 2017-11-24 DIAGNOSIS — Z79899 Other long term (current) drug therapy: Secondary | ICD-10-CM | POA: Diagnosis not present

## 2017-11-24 DIAGNOSIS — F200 Paranoid schizophrenia: Secondary | ICD-10-CM

## 2017-11-24 DIAGNOSIS — Z0000 Encounter for general adult medical examination without abnormal findings: Secondary | ICD-10-CM | POA: Diagnosis not present

## 2017-11-24 LAB — COMPREHENSIVE METABOLIC PANEL
ALBUMIN: 4.1 g/dL (ref 3.5–5.0)
ALT: 20 U/L (ref 0–44)
AST: 21 U/L (ref 15–41)
Alkaline Phosphatase: 59 U/L (ref 38–126)
Anion gap: 7 (ref 5–15)
BUN: 6 mg/dL (ref 6–20)
CHLORIDE: 105 mmol/L (ref 98–111)
CO2: 28 mmol/L (ref 22–32)
Calcium: 9 mg/dL (ref 8.9–10.3)
Creatinine, Ser: 1.19 mg/dL (ref 0.61–1.24)
GFR calc Af Amer: >60 mL/min (ref >60)
GFR calc non Af Amer: >60 mL/min (ref >60)
Glucose, Bld: 69 mg/dL — ABNORMAL LOW (ref 70–99)
Potassium: 3.7 mmol/L (ref 3.5–5.1)
Sodium: 140 mmol/L (ref 135–145)
Total Bilirubin: 0.4 mg/dL (ref 0.3–1.2)
Total Protein: 7 g/dL (ref 6.5–8.1)

## 2017-11-24 LAB — CBC WITH DIFFERENTIAL/PLATELET
BASOS ABS: 0 10*3/uL (ref 0.0–0.1)
Basophils Relative: 0 %
EOS ABS: 0.2 10*3/uL (ref 0.0–0.7)
EOS PCT: 2 %
HCT: 39.5 % (ref 39.0–52.0)
Hemoglobin: 13.7 g/dL (ref 13.0–17.0)
LYMPHS ABS: 2.8 10*3/uL (ref 0.7–4.0)
Lymphocytes Relative: 31 %
MCH: 30 pg (ref 26.0–34.0)
MCHC: 34.7 g/dL (ref 30.0–36.0)
MCV: 86.4 fL (ref 78.0–100.0)
MONO ABS: 0.5 10*3/uL (ref 0.1–1.0)
Monocytes Relative: 5 %
NEUTROS PCT: 62 %
Neutro Abs: 5.5 10*3/uL (ref 1.7–7.7)
PLATELETS: 275 10*3/uL (ref 150–400)
RBC: 4.57 MIL/uL (ref 4.22–5.81)
RDW: 12.8 % (ref 11.5–15.5)
WBC: 9 10*3/uL (ref 4.0–10.5)

## 2017-11-24 LAB — RAPID URINE DRUG SCREEN, HOSP PERFORMED
Amphetamines: NOT DETECTED
Barbiturates: NOT DETECTED
Benzodiazepines: NOT DETECTED
COCAINE: NOT DETECTED
OPIATES: NOT DETECTED
Tetrahydrocannabinol: NOT DETECTED

## 2017-11-24 LAB — ETHANOL: Alcohol, Ethyl (B): <10 mg/dL (ref <10)

## 2017-11-24 MED ORDER — LAMOTRIGINE 25 MG PO TABS: 25.0000 mg | ORAL_TABLET | ORAL | Status: DC

## 2017-11-24 MED ORDER — FLUPHENAZINE HCL 5 MG PO TABS
15.0000 mg | ORAL_TABLET | Freq: Every day | ORAL | Status: DC
  Filled 2017-11-24 (×2): qty 1

## 2017-11-24 MED ORDER — LAMOTRIGINE 25 MG PO TABS: 75.0000 mg | ORAL_TABLET | Freq: Every evening | ORAL | Status: DC

## 2017-11-24 MED ORDER — PROPRANOLOL HCL 10 MG PO TABS
20.0000 mg | ORAL_TABLET | Freq: Three times a day (TID) | ORAL | Status: DC
Start: 2017-11-24 — End: 2017-11-25

## 2017-11-24 MED ORDER — FLUPHENAZINE HCL 10 MG PO TABS
10.0000 mg | ORAL_TABLET | Freq: Every day | ORAL | Status: DC
Start: 2017-11-25 — End: 2017-11-25
  Filled 2017-11-24 (×2): qty 1

## 2017-11-24 MED ORDER — BENZTROPINE MESYLATE 1 MG PO TABS: 1.0000 mg | ORAL_TABLET | ORAL | Status: DC

## 2017-11-24 MED ORDER — QUETIAPINE FUMARATE 100 MG PO TABS: 300.0000 mg | ORAL_TABLET | Freq: Every day | ORAL | Status: DC

## 2017-11-24 MED ORDER — HYDROXYZINE HCL 25 MG PO TABS: 25.0000 mg | ORAL_TABLET | Freq: Three times a day (TID) | ORAL | Status: DC | PRN

## 2017-11-24 MED ORDER — FLUPHENAZINE HCL 10 MG PO TABS: 10.0000 mg | ORAL_TABLET | Freq: Two times a day (BID) | ORAL | Status: DC

## 2017-11-24 MED ORDER — NICOTINE 21 MG/24HR TD PT24: 21.0000 mg | MEDICATED_PATCH | Freq: Every day | TRANSDERMAL | Status: DC

## 2017-11-24 MED ORDER — ATORVASTATIN CALCIUM 10 MG PO TABS: 20.0000 mg | ORAL_TABLET | Freq: Every day | ORAL | Status: DC

## 2017-11-24 NOTE — ED Triage Notes (Signed)
Pt reports that he "still feels like something is running through his vein" Since he took that hit off of the cigarette.

## 2017-11-24 NOTE — ED Notes (Signed)
Pt upset and wanting to leave. Dr Clarene DukeMcManus in room to speak with pt and try to convince him to stay. Pt declined and left.

## 2017-11-24 NOTE — ED Provider Notes (Addendum)
[name] Provider Note   CSN: [id number] Arrival date & time: 11/24/17  1722     History   Chief Complaint Chief Complaint  Patient presents with  . Paranoid    HPI James SizerBrian T Umholtz is a 42 y.o. male.  HPI  Pt was seen at 2035.  Per pt, c/o gradual onset and persistence of multiple subjective somatic symptoms since "taking a hit from a cigarette" 4 days ago. Pt states he "thinks there was something in it." Symptoms include: feeling he "lost focus," "foggy in the head" and "tingling in his hands and feet." States he "returned to normal" the day after but "still feels like something is running through his vein."  Pt has been to the Police and ED x3 for this complaint. Denies SI/HI, no SA.    Past Medical History:  Diagnosis Date  . Bipolar disorder (HCC)   . Hyperlipidemia   . Paranoid schizophrenia (HCC)   . Schizophrenia [facility](HCC)     Patient Active Problem List   Diagnosis Date Noted  . Tobacco use disorder   . Schizoaffective disorder, bipolar type (HCC) 06/29/2014    Past Surgical History:  Procedure Laterality Date  . TESTICLE SURGERY          Home Medications    Prior to Admission medications   Medication Sig Start Date End Date Taking? Authorizing Provider  atorvastatin (LIPITOR) 20 MG tablet Take 1 tablet (20 mg total) by mouth daily. 07/21/14   [name], [name], NP  benztropine (COGENTIN) 1 MG tablet Take 1 tablet (1 mg total) by mouth 2 (two) times daily in the am and at bedtime.. 07/21/14   [name], [name], NP  cyclobenzaprine (FLEXERIL) 10 MG tablet Take 1 tablet (10 mg total) by mouth 2 (two) times daily as needed for muscle spasms. 11/28/14   [name], Hope M, NP  fluPHENAZine (PROLIXIN) 5 MG tablet Take 2 tabs (10 mg) in the am.  Then taken 3 tabs (15 mg) in the evening. Patient taking differently: Take 10-15 mg by mouth 2 (two) times daily. Take 2 tabs (10 mg) in the am.  Then taken 3 tabs (15 mg) in the evening. 07/21/14   [name], [name],  NP  fluPHENAZine decanoate (PROLIXIN) 25 MG/ML injection Inject 1 mL (25 mg total) into the muscle every 14 (fourteen) days. Last given on 07/20/2014.  NEXT dose due on 08/02/2014 Patient taking differently: Inject 25 mg into the muscle every 30 (thirty) days. Last given on 07/20/2014.  NEXT dose due on 08/02/2014 08/02/14   [name], [name], NP  HYDROcodone-acetaminophen (NORCO/VICODIN) 5-325 MG per tablet Take 1 tablet by mouth every 4 (four) hours as needed. 11/28/14   [name], Hope M, NP  hydrOXYzine (ATARAX/VISTARIL) 25 MG tablet Take 1 tablet (25 mg total) by mouth 3 (three) times daily as needed for anxiety (anxiety and sleep). 07/21/14   [name], [name], NP  ibuprofen (ADVIL,MOTRIN) 600 MG tablet Take 1 tablet (600 mg total) by mouth every 6 (six) hours as needed. 04/10/15   Burgess AmorIdol, Julie, PA-C  lamoTRIgine (LAMICTAL) 25 MG tablet Take 1 tab (25 mg) in the morning.  Then take 3 tabs (75 mg) in the evening. 07/21/14   [name], [name], NP  Multiple Vitamin (MULTIVITAMIN WITH MINERALS) TABS tablet Take 1 tablet by mouth daily. 07/21/14   [name], [name], NP  naproxen (NAPROSYN) 500 MG tablet Take 1 tablet (500 mg total) by mouth 2 (two) times daily. 12/03/14   Burgess AmorIdol, Julie, PA-C  nicotine (NICODERM CQ -  DOSED IN MG/24 HOURS) 21 mg/24hr patch Place 1 patch (21 mg total) onto the skin daily. 07/21/14   Adonis Brook, NP  propranolol (INDERAL) 20 MG tablet Take 1 tablet (20 mg total) by mouth 3 (three) times daily. 07/21/14   Adonis Brook, NP  QUEtiapine (SEROQUEL) 300 MG tablet Take 1 tablet (300 mg total) by mouth at bedtime. 07/21/14   Adonis Brook, NP    Family History Family History  Problem Relation Age of Onset  . Schizophrenia Neg Hx     Social History Social History   Tobacco Use  . Smoking status: Current Every Day Smoker    Packs/day: 1.00    Types: Cigarettes  . Smokeless tobacco: Never Used  Substance Use Topics  . Alcohol use: No  . Drug use: No     Allergies   Other   Review of  Systems Review of Systems ROS: Statement: All systems negative except as marked or noted in the HPI; Constitutional: Negative for fever and chills. ; ; Eyes: Negative for eye pain, redness and discharge. ; ; ENMT: Negative for ear pain, hoarseness, nasal congestion, sinus pressure and sore throat. ; ; Cardiovascular: Negative for chest pain, palpitations, diaphoresis, dyspnea and peripheral edema. ; ; Respiratory: Negative for cough, wheezing and stridor. ; ; Gastrointestinal: Negative for nausea, vomiting, diarrhea, abdominal pain, blood in stool, hematemesis, jaundice and rectal bleeding. . ; ; Genitourinary: Negative for dysuria, flank pain and hematuria. ; ; Musculoskeletal: Negative for back pain and neck pain. Negative for swelling and trauma.; ; Skin: Negative for pruritus, rash, abrasions, blisters, bruising and skin lesion.; ; Neuro: Negative for headache, lightheadedness and neck stiffness. Negative for weakness, altered level of consciousness, altered mental status, extremity weakness, paresthesias, involuntary movement, seizure and syncope.; Psych:  +anxiety. No SI, no SA, no HI, no hallucinations.      Physical Exam Updated Vital Signs BP (!) 150/80 (BP Location: Right Arm)   Pulse 69   Temp 98.1 F (36.7 C) (Oral)   Resp 16   Ht 6\' 1"  (1.854 m)   Wt 99.8 kg (220 lb)   SpO2 98%   BMI 29.03 kg/m   Physical Exam 2040: Physical examination:  Nursing notes reviewed; Vital signs and O2 SAT reviewed;  Constitutional: Well developed, Well nourished, Well hydrated, In no acute distress; Head:  Normocephalic, atraumatic; Eyes: EOMI, PERRL, No scleral icterus; ENMT: Mouth and pharynx normal, Mucous membranes moist; Neck: Supple, Full range of motion; Cardiovascular: Regular rate and rhythm; Respiratory: Breath sounds clear, No wheezes.  Speaking full sentences with ease, Normal respiratory effort/excursion; Chest: No deformity, Movement normal; Abdomen: Nondistended; Extremities: No  deformity.; Neuro: AA&Ox3, Major CN grossly intact.  Speech clear. No gross focal motor deficits in extremities. Climbs on and off stretcher easily by himself. Gait steady.; Skin: Color normal, Warm, Dry.; Psych:  Anxious, perseverating.     ED Treatments / Results  Labs (all labs ordered are listed, but only abnormal results are displayed)   EKG None  Radiology   Procedures Procedures (including critical care time)  Medications Ordered in ED Medications - No data to display   Initial Impression / Assessment and Plan / ED Course  I have reviewed the triage vital signs and the nursing notes.  Pertinent labs & imaging results that were available during my care of the patient were reviewed by me and considered in my medical decision making (see chart for details).  MDM Reviewed: previous chart, nursing note and vitals Reviewed previous: labs  Interpretation: labs   Results for orders placed or performed during the hospital encounter of 11/24/17  Urine rapid drug screen (hosp performed)  Result Value Ref Range   Opiates NONE DETECTED NONE DETECTED   Cocaine NONE DETECTED NONE DETECTED   Benzodiazepines NONE DETECTED NONE DETECTED   Amphetamines NONE DETECTED NONE DETECTED   Tetrahydrocannabinol NONE DETECTED NONE DETECTED   Barbiturates NONE DETECTED NONE DETECTED  Ethanol  Result Value Ref Range   Alcohol, Ethyl (B) <10 <10 mg/dL  CBC with Differential  Result Value Ref Range   WBC 9.0 4.0 - 10.5 K/uL   RBC 4.57 4.22 - 5.81 MIL/uL   Hemoglobin 13.7 13.0 - 17.0 g/dL   HCT 78.2 95.6 - 21.3 %   MCV 86.4 78.0 - 100.0 fL   MCH 30.0 26.0 - 34.0 pg   MCHC 34.7 30.0 - 36.0 g/dL   RDW 08.6 57.8 - 46.9 %   Platelets 275 150 - 400 K/uL   Neutrophils Relative % 62 %   Neutro Abs 5.5 1.7 - 7.7 K/uL   Lymphocytes Relative 31 %   Lymphs Abs 2.8 0.7 - 4.0 K/uL   Monocytes Relative 5 %   Monocytes Absolute 0.5 0.1 - 1.0 K/uL   Eosinophils Relative 2 %   Eosinophils Absolute  0.2 0.0 - 0.7 K/uL   Basophils Relative 0 %   Basophils Absolute 0.0 0.0 - 0.1 K/uL  Comprehensive metabolic panel  Result Value Ref Range   Sodium 140 135 - 145 mmol/L   Potassium 3.7 3.5 - 5.1 mmol/L   Chloride 105 98 - 111 mmol/L   CO2 28 22 - 32 mmol/L   Glucose, Bld 69 (L) 70 - 99 mg/dL   BUN 6 6 - 20 mg/dL   Creatinine, Ser 6.29 0.61 - 1.24 mg/dL   Calcium 9.0 8.9 - 52.8 mg/dL   Total Protein 7.0 6.5 - 8.1 g/dL   Albumin 4.1 3.5 - 5.0 g/dL   AST 21 15 - 41 U/L   ALT 20 0 - 44 U/L   Alkaline Phosphatase 59 38 - 126 U/L   Total Bilirubin 0.4 0.3 - 1.2 mg/dL   GFR calc non Af Amer >60 >60 mL/min   GFR calc Af Amer >60 >60 mL/min   Anion gap 7 5 - 15     2145:  [name] 3rd ED visit for multiple continued subjective somatic symptoms "after taking a hit" from a cigarette he believes "was something in it" 4 days ago. Pt does have hx psychosis/schizophrenia; will have TTS evaluate. Holding orders written.   2200:  Pt states he "just wants to go home now." Attempts by myself and ED RN to convince pt to stay to speak with TTS. Pt refuses, states "you can't hold me." Denies SI/HI.  States he is leaving to go home and take his psych meds and walked out of the ED.      Final Clinical Impressions(s) / ED Diagnoses   Final diagnoses:  None    ED Discharge Orders    None         Samuel Jester, DO 11/24/17 2209

## 2018-03-01 ENCOUNTER — Other Ambulatory Visit: Payer: Self-pay

## 2018-03-01 ENCOUNTER — Emergency Department (HOSPITAL_COMMUNITY)
Admission: EM | Admit: 2018-03-01 | Discharge: 2018-03-02 | Discharge status: Against Medical Advice | Payer: Medicare Other | Attending: Emergency Medicine | Admitting: Emergency Medicine

## 2018-03-01 ENCOUNTER — Encounter (HOSPITAL_COMMUNITY): Payer: Self-pay | Admitting: Emergency Medicine

## 2018-03-01 DIAGNOSIS — F250 Schizoaffective disorder, bipolar type: Secondary | ICD-10-CM | POA: Diagnosis not present

## 2018-03-01 DIAGNOSIS — Z5329 Procedure and treatment not carried out because of patient's decision for other reasons: Secondary | ICD-10-CM | POA: Diagnosis not present

## 2018-03-01 DIAGNOSIS — F209 Schizophrenia, unspecified: Secondary | ICD-10-CM | POA: Diagnosis not present

## 2018-03-01 DIAGNOSIS — F200 Paranoid schizophrenia: Secondary | ICD-10-CM | POA: Diagnosis present

## 2018-03-01 DIAGNOSIS — F22 Delusional disorders: Secondary | ICD-10-CM

## 2018-03-01 LAB — COMPREHENSIVE METABOLIC PANEL
ALBUMIN: 4.6 g/dL (ref 3.5–5.0)
ALT: 35 U/L (ref 0–44)
AST: 64 U/L — AB (ref 15–41)
Alkaline Phosphatase: 67 U/L (ref 38–126)
Anion gap: 11 (ref 5–15)
BILIRUBIN TOTAL: 0.7 mg/dL (ref 0.3–1.2)
BUN: 8 mg/dL (ref 6–20)
CHLORIDE: 100 mmol/L (ref 98–111)
CO2: 24 mmol/L (ref 22–32)
CREATININE: 1 mg/dL (ref 0.61–1.24)
Calcium: 9.3 mg/dL (ref 8.9–10.3)
GFR calc Af Amer: >60 mL/min (ref >60)
GFR calc non Af Amer: >60 mL/min (ref >60)
GLUCOSE: 102 mg/dL — AB (ref 70–99)
Potassium: 3.4 mmol/L — ABNORMAL LOW (ref 3.5–5.1)
Sodium: 135 mmol/L (ref 135–145)
TOTAL PROTEIN: 7.8 g/dL (ref 6.5–8.1)

## 2018-03-01 LAB — CBC WITH DIFFERENTIAL/PLATELET
ABS IMMATURE GRANULOCYTES: 0.06 10*3/uL (ref 0.00–0.07)
Basophils Absolute: 0.1 10*3/uL (ref 0.0–0.1)
Basophils Relative: 0 %
Eosinophils Absolute: 0.2 10*3/uL (ref 0.0–0.5)
Eosinophils Relative: 2 %
HEMATOCRIT: 45 % (ref 39.0–52.0)
Hemoglobin: 15.6 g/dL (ref 13.0–17.0)
IMMATURE GRANULOCYTES: 1 %
LYMPHS ABS: 2.1 10*3/uL (ref 0.7–4.0)
LYMPHS PCT: 16 %
MCH: 30.1 pg (ref 26.0–34.0)
MCHC: 34.7 g/dL (ref 30.0–36.0)
MCV: 86.9 fL (ref 80.0–100.0)
Monocytes Absolute: 0.8 10*3/uL (ref 0.1–1.0)
Monocytes Relative: 6 %
NEUTROS ABS: 9.6 10*3/uL — AB (ref 1.7–7.7)
NEUTROS PCT: 75 %
Platelets: 368 10*3/uL (ref 150–400)
RBC: 5.18 MIL/uL (ref 4.22–5.81)
RDW: 11.9 % (ref 11.5–15.5)
WBC: 12.8 10*3/uL — ABNORMAL HIGH (ref 4.0–10.5)
nRBC: 0 % (ref 0.0–0.2)

## 2018-03-01 LAB — RAPID URINE DRUG SCREEN, HOSP PERFORMED
Amphetamines: NOT DETECTED
Barbiturates: NOT DETECTED
Benzodiazepines: NOT DETECTED
Cocaine: NOT DETECTED
OPIATES: NOT DETECTED
TETRAHYDROCANNABINOL: NOT DETECTED

## 2018-03-01 LAB — ETHANOL
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
## 2018-03-01 NOTE — ED Triage Notes (Signed)
Pt states that he is paranoid he states that he is not safe where he is living. Denies si and hi

## 2018-03-02 DIAGNOSIS — F250 Schizoaffective disorder, bipolar type: Secondary | ICD-10-CM | POA: Diagnosis not present

## 2018-03-02 MED ORDER — NICOTINE 21 MG/24HR TD PT24
21.0000 mg | MEDICATED_PATCH | Freq: Once | TRANSDERMAL | Status: DC
  Administered 2018-03-02: 21 mg via TRANSDERMAL
  Filled 2018-03-02: qty 1

## 2018-03-02 MED ORDER — QUETIAPINE FUMARATE 100 MG PO TABS
300.0000 mg | ORAL_TABLET | Freq: Every day | ORAL | Status: DC
  Administered 2018-03-02: 300 mg via ORAL
  Filled 2018-03-02: qty 3

## 2018-03-02 MED ORDER — FLUPHENAZINE HCL 5 MG/ML PO CONC: 10.0000 mg | ORAL | Status: DC

## 2018-03-02 MED ORDER — LAMOTRIGINE 25 MG PO TABS
75.0000 mg | ORAL_TABLET | Freq: Every evening | ORAL | Status: DC
  Administered 2018-03-02: 75 mg via ORAL
  Filled 2018-03-02: qty 3

## 2018-03-02 MED ORDER — PROPRANOLOL HCL 10 MG PO TABS: 20.0000 mg | ORAL_TABLET | Freq: Three times a day (TID) | ORAL | Status: DC

## 2018-03-02 MED ORDER — FLUPHENAZINE HCL 5 MG/ML PO CONC: 15.0000 mg | Freq: Every evening | ORAL | Status: DC

## 2018-03-02 MED ORDER — LAMOTRIGINE 25 MG PO TABS: 25.0000 mg | ORAL_TABLET | ORAL | Status: DC

## 2018-03-02 MED ORDER — HYDROXYZINE HCL 25 MG PO TABS
25.0000 mg | ORAL_TABLET | Freq: Three times a day (TID) | ORAL | Status: DC | PRN
Start: 2018-03-02 — End: 2018-03-02

## 2018-03-02 MED ORDER — ATORVASTATIN CALCIUM 10 MG PO TABS: 20.0000 mg | ORAL_TABLET | Freq: Every day | ORAL | Status: DC

## 2018-03-02 MED ORDER — BENZTROPINE MESYLATE 1 MG PO TABS
1.0000 mg | ORAL_TABLET | ORAL | Status: DC
Start: 2018-03-02 — End: 2018-03-02

## 2018-03-02 NOTE — ED Notes (Signed)
Pt left with iv in place

## 2018-03-02 NOTE — BHH Counselor (Signed)
  BH Disposition  Disposition: LPC discussed case with BH provider, [name], NP who recommends inpatient treatment.  TTS will look for placement.  [facility] informed ER provider, Dr. Blinda Leatherwood, MD and ER nurse, Dorathy Daft, RN of the recommended disposition.  Lucerito Rosinski L. Norville Dani, MS, LPC, [facility] Therapeutic Triage Specialist  [phone]

## 2018-03-02 NOTE — BH Assessment (Signed)
Tele Assessment Note   Patient Name: [name] MRN: [id number] Referring Physician: Dr. Loren Racer, Md Location of Patient: [name] Location of Provider: Behavioral Health TTS Department  [name] is an 42 y.o. male who voluntarily came to [facility] due to increase paranoia.  Pt stated "I was dropped off because I'm paranoid about staying at my house."  Pt stated "its been going on for a few days now and I don't feel safe. I think someone trying to get me. The other day I came home and thought somebody was in my bedroom. So I walked to the police station and had them go check my apartment out."  Pt had multiple stories of him showing signs of paranoia. Pt admit A/V-hallucinations.  Pt reports having an ACTT team with [facility] and reports taking his medication as prescribed.  Pt is unable to give an account of his sleep pattern.  Pt states "I can't tell you the last time I went to sleep, I'm too paranoid to go to sleep."  Pt denies SI/HI/SA-(pt reports 5 yrs of sobriety from Alcohol and cannabis use).  Pt was able to contract for safety.  Pt reports living home alone and being able to return at discharge.  According to pt's chart he was treated inpatient at [facility] in 2016 for paranoia  Pt denies having a history of any physical, sexual, or verbal abuse.  Patient was wearing t-shirt and pants and appeared appropriately groomed.  Pt was alert throughout the assessment.  Patient made fair eye contact and had normal psychomotor activity.  Patient spoke in a normal voice without pressured speech.  Pt expressed feeling paranoid.  [name] affect appeared euthymic normal mood and incongruent with stated mood. [name] thought process was irrational.  Pt presented with partial  insight and judgement.  Pt did not appear to be responding to internal stimuli.  Pt was able to contract for safety but continued to express fear of going home.   Disposition: LPC discussed case with BH  provider, [name], NP who recommends inpatient treatment.  TTS will look for placement.  [facility] informed ER provider, Dr. Blinda Leatherwood, MD and ER nurse, Dorathy Daft, RN of the recommended disposition.  Diagnosis: Schizoaffective Disorder, Bipolar type  Past Medical History:  Past Medical History:  Diagnosis Date  . Bipolar disorder (HCC)   . Hyperlipidemia   . Paranoid schizophrenia (HCC)   . Schizophrenia [facility])     Past Surgical History:  Procedure Laterality Date  . TESTICLE SURGERY      Family History:  Family History  Problem Relation Age of Onset  . Schizophrenia Neg Hx     Social History:  reports that he has been smoking cigarettes. He has been smoking about 1.00 pack per day. He has never used smokeless tobacco. He reports that he does not drink alcohol or use drugs.  Additional Social History:  Alcohol / Drug Use Pain Medications: See MARs Prescriptions: See MARs Over the Counter: See MARs History of alcohol / drug use?: Yes Longest period of sobriety (when/how long): "Pt reports being clean for 5 yrs from marijuana and alcohol" Substance #1 Name of Substance 1: Alcohol 1 - Age of First Use: unknown 1 - Amount (size/oz): unknown 1 - Frequency: unknown 1 - Duration: unknown 1 - Last Use / Amount: "5 yrs ago" Substance #2 Name of Substance 2: Cannnabis 2 - Age of First Use: unknown 2 - Amount (size/oz): unknown 2 - Frequency: unknown 2 -  Duration: unknown 2 - Last Use / Amount: "5 yrs ago"  CIWA: CIWA-Ar BP: (!) 139/91 Pulse Rate: 95 COWS:    Allergies:  Allergies  Allergen Reactions  . Other     Some trial medicine from [facility].     Home Medications:  (Not in a hospital admission)  OB/GYN Status:  No LMP for male patient.  General Assessment Data Location of Assessment: [facility] TTS Assessment: In system Is this a Tele or Face-to-Face Assessment?: Tele Assessment Is this an Initial Assessment or a Re-assessment for this encounter?: Initial  Assessment Patient Accompanied by:: N/A Language Other than English: No Living Arrangements: Other (Comment)(Pt lives alone) What gender do you identify as?: Male Marital status: Single Living Arrangements: Alone Can pt return to current living arrangement?: Yes Admission Status: Voluntary Is patient capable of signing voluntary admission?: Yes Referral Source: Self/Family/Friend Insurance type: [place] [employer]     Crisis Care Plan Living Arrangements: Alone Legal Guardian: Other:(Self) Name of Psychiatrist: [facility] Name of Therapist: [facility]  Education Status Is patient currently in school?: No Is the patient employed, unemployed or receiving disability?: Receiving disability income  Risk to self with the past 6 months Suicidal Ideation: No Has patient been a risk to self within the past 6 months prior to admission? : No Suicidal Intent: No Has patient had any suicidal intent within the past 6 months prior to admission? : No Is patient at risk for suicide?: No Suicidal Plan?: No Has patient had any suicidal plan within the past 6 months prior to admission? : No Access to Means: No Previous Attempts/Gestures: No Triggers for Past Attempts: None known Intentional Self Injurious Behavior: None Family Suicide History: Unknown Recent stressful life event(s): Other (Comment)(Paranoia) Persecutory voices/beliefs?: No Depression: No Substance abuse history and/or treatment for substance abuse?: No Suicide prevention information given to non-admitted patients: Not applicable  Risk to Others within the past 6 months Homicidal Ideation: No Does patient have any lifetime risk of violence toward others beyond the six months prior to admission? : No Thoughts of Harm to Others: No Current Homicidal Intent: No Current Homicidal Plan: No Access to Homicidal Means: No History of harm to others?: No Assessment of Violence: None Noted Does patient have access to weapons?:  No Criminal Charges Pending?: No Does patient have a court date: No Is patient on probation?: No  Psychosis Hallucinations: Auditory, Visual Delusions: None noted  Mental Status Report Appearance/Hygiene: Unremarkable Eye Contact: Fair Motor Activity: Freedom of movement Speech: Logical/coherent Level of Consciousness: Alert, Quiet/awake Mood: Preoccupied Affect: Anxious, Preoccupied Anxiety Level: Minimal Thought Processes: Coherent Judgement: Impaired Orientation: Person, Place, Time, Appropriate for developmental age Obsessive Compulsive Thoughts/Behaviors: None  Cognitive Functioning Concentration: Normal Memory: Recent Intact, Remote Intact Is patient IDD: No Insight: Fair Impulse Control: Good Appetite: Good Have you had any weight changes? : No Change Sleep: Decreased Total Hours of Sleep: 2 Vegetative Symptoms: Not bathing, Decreased grooming  ADLScreening [facility] Assessment Services) Patient's cognitive ability adequate to safely complete daily activities?: Yes Patient able to express need for assistance with ADLs?: Yes Independently performs ADLs?: Yes (appropriate for developmental age)  Prior Inpatient Therapy Prior Inpatient Therapy: Yes Prior Therapy Dates: unknown Prior Therapy Facilty/Provider(s): [facility] [place] Reason for Treatment: paranoia  Prior Outpatient Therapy Prior Outpatient Therapy: Yes Prior Therapy Dates: ongoing Prior Therapy Facilty/Provider(s): [facility] Reason for Treatment: paranoia Does patient have an ACCT team?: Yes Does patient have Intensive In-House Services?  : No Does patient have [facility] services? : No Does  patient have P4CC services?: No  ADL Screening (condition at time of admission) Patient's cognitive ability adequate to safely complete daily activities?: Yes Is the patient deaf or have difficulty hearing?: No Does the patient have difficulty seeing, even when wearing glasses/contacts?: No Does the patient have difficulty  concentrating, remembering, or making decisions?: No Patient able to express need for assistance with ADLs?: Yes Does the patient have difficulty dressing or bathing?: No Independently performs ADLs?: Yes (appropriate for developmental age) Does the patient have difficulty walking or climbing stairs?: No Weakness of Legs: None Weakness of Arms/Hands: None  Home Assistive Devices/Equipment Home Assistive Devices/Equipment: None    Abuse/Neglect Assessment (Assessment to be complete while patient is alone) Abuse/Neglect Assessment Can Be Completed: Yes Physical Abuse: Denies Verbal Abuse: Denies Sexual Abuse: Denies Exploitation of patient/patient's resources: Denies Self-Neglect: Denies Values / Beliefs Cultural Requests During Hospitalization: None Spiritual Requests During Hospitalization: None Consults Spiritual Care Consult Needed: No Social Work Consult Needed: No Merchant navy officer (For Healthcare) Does Patient Have a Medical Advance Directive?: No Would patient like information on creating a medical advance directive?: No - Patient declined          Disposition: LPC discussed case with BH provider, [name], NP who recommends inpatient treatment.  TTS will look for placement.  [facility] informed ER provider, Dr. Blinda Leatherwood, MD and ER nurse, Dorathy Daft, RN of the recommended disposition. Disposition Initial Assessment Completed for this Encounter: Yes  This service was provided via telemedicine using a 2-way, interactive audio and video technology.  Names of all persons participating in this telemedicine service and their role in this encounter. Name: Nichola Sizer Role: Patient  Name: Makaio Mach L. Lamyia Cdebaca, MS, LPC, NCC Role: Therapist  Name: [name], NP Role: [facility] Provider  Name:  Role:     Keniya Schlotterbeck L Elaria Osias 03/02/2018 1:04 AM

## 2018-03-02 NOTE — ED Provider Notes (Signed)
Suddenly got up out of bed, wandered out into the hallway and reported that he was hungry and was going to [employer].  Tried to redirect him back to the room but he refused.  Patient does not currently under IVC, therefore security could not force him back into the room.  Patient unfortunately left with his IV in place.  Reviewing records reveals that behavioral health has recommended inpatient treatment for him.  IVC paperwork was therefore initiated, once approved, police will be asked to bring the patient back to the emergency room for placement.   [name], MD 03/02/18 [phone]

## 2018-03-02 NOTE — ED Notes (Signed)
TTS in progress 

## 2018-03-02 NOTE — ED Notes (Signed)
Pt stated he wanted to leave to go get [employer] appx [phone]. Advised that pt needed to stay. Pt said that he just wanted to stay the night and does not need to stay any longer. edp present. Pt said he was going to leave anyway. IVC papers being filled out. [employer] notified.

## 2018-03-04 NOTE — ED Notes (Signed)
Followed up with [employer] about pt. When pt left, pt was IVC'd and had IV in place. [employer] notified and officer dispatched to find pt. When [employer] contacted today, they stated they had no knowledge of IVC paperwork and would try to locate paperwork. Stated they would call back. Still have not heard back. 03/04/2018 1820.

## 2018-03-05 ENCOUNTER — Encounter (HOSPITAL_COMMUNITY): Payer: Self-pay

## 2018-03-05 ENCOUNTER — Emergency Department (HOSPITAL_COMMUNITY)
Admission: EM | Admit: 2018-03-05 | Discharge: 2018-03-06 | Disposition: A | Discharge status: Home / Self Care | Payer: Medicare Other | Attending: Emergency Medicine | Admitting: Emergency Medicine

## 2018-03-05 ENCOUNTER — Other Ambulatory Visit: Payer: Self-pay

## 2018-03-05 DIAGNOSIS — F319 Bipolar disorder, unspecified: Secondary | ICD-10-CM | POA: Insufficient documentation

## 2018-03-05 DIAGNOSIS — E876 Hypokalemia: Secondary | ICD-10-CM | POA: Insufficient documentation

## 2018-03-05 DIAGNOSIS — F99 Mental disorder, not otherwise specified: Secondary | ICD-10-CM | POA: Diagnosis present

## 2018-03-05 DIAGNOSIS — F200 Paranoid schizophrenia: Secondary | ICD-10-CM

## 2018-03-05 DIAGNOSIS — F17200 Nicotine dependence, unspecified, uncomplicated: Secondary | ICD-10-CM | POA: Insufficient documentation

## 2018-03-05 DIAGNOSIS — Z79899 Other long term (current) drug therapy: Secondary | ICD-10-CM | POA: Insufficient documentation

## 2018-03-05 NOTE — ED Notes (Signed)
Patient states he ate supper. Patient drinking Ginger-ale at this time and patient given snacks (graham crackers peanut butter with saltine crackers.

## 2018-03-05 NOTE — ED Provider Notes (Signed)
[facility] EMERGENCY DEPARTMENT Provider Note   CSN: [id number] Arrival date & time: 03/05/18  2317     History   Chief Complaint Chief Complaint  Patient presents with  . Medical Clearance    HPI [name] is a 42 y.o. male.  The history is provided by the patient. No language interpreter was used.  Mental Health Problem  Presenting symptoms: paranoid behavior   Degree of incapacity (severity):  Mild Onset quality:  Sudden Duration:  1 week Timing:  Constant Progression:  Worsening Chronicity:  New Context: not alcohol use and not drug abuse   Treatment compliance:  Most of the time Time since last psychoactive medication taken:  1 hour Relieved by:  Nothing Worsened by:  Nothing Ineffective treatments:  None tried Associated symptoms: no euphoric mood   Pt reports he has paranoid schizophrenia. Pt states he is worried that someone is in his apartment that will harm him.  Pt admits to feeling foggy recently but feels clear headed currently.  Pt states he can not get a ride to his house and can not get his medication today.  Pt would like a dose here.  He can not tell me what he takes  Past Medical History:  Diagnosis Date  . Bipolar disorder (HCC)   . Hyperlipidemia   . Paranoid schizophrenia (HCC)   . Schizophrenia [facility])     Patient Active Problem List   Diagnosis Date Noted  . Tobacco use disorder   . Schizoaffective disorder, bipolar type (HCC) 06/29/2014    Past Surgical History:  Procedure Laterality Date  . TESTICLE SURGERY          Home Medications    Prior to Admission medications   Medication Sig Start Date End Date Taking? Authorizing Provider  atorvastatin (LIPITOR) 20 MG tablet Take 1 tablet (20 mg total) by mouth daily. 07/21/14   Adonis Brook, NP  benztropine (COGENTIN) 1 MG tablet Take 1 tablet (1 mg total) by mouth 2 (two) times daily in the am and at bedtime.. 07/21/14   Adonis Brook, NP  cyclobenzaprine (FLEXERIL) 10 MG  tablet Take 1 tablet (10 mg total) by mouth 2 (two) times daily as needed for muscle spasms. 11/28/14   Janne Napoleon, NP  fluPHENAZine (PROLIXIN) 5 MG tablet Take 2 tabs (10 mg) in the am.  Then taken 3 tabs (15 mg) in the evening. Patient taking differently: Take 10-15 mg by mouth 2 (two) times daily. Take 2 tabs (10 mg) in the am.  Then taken 3 tabs (15 mg) in the evening. 07/21/14   Adonis Brook, NP  fluPHENAZine decanoate (PROLIXIN) 25 MG/ML injection Inject 1 mL (25 mg total) into the muscle every 14 (fourteen) days. Last given on 07/20/2014.  NEXT dose due on 08/02/2014 Patient taking differently: Inject 25 mg into the muscle every 30 (thirty) days. Last given on 07/20/2014.  NEXT dose due on 08/02/2014 08/02/14   Adonis Brook, NP  HYDROcodone-acetaminophen (NORCO/VICODIN) 5-325 MG per tablet Take 1 tablet by mouth every 4 (four) hours as needed. 11/28/14   Janne Napoleon, NP  hydrOXYzine (ATARAX/VISTARIL) 25 MG tablet Take 1 tablet (25 mg total) by mouth 3 (three) times daily as needed for anxiety (anxiety and sleep). 07/21/14   Adonis Brook, NP  ibuprofen (ADVIL,MOTRIN) 600 MG tablet Take 1 tablet (600 mg total) by mouth every 6 (six) hours as needed. 04/10/15   Burgess Amor, PA-C  lamoTRIgine (LAMICTAL) 25 MG tablet Take 1 tab (25 mg)  in the morning.  Then take 3 tabs (75 mg) in the evening. 07/21/14   Adonis Brook, NP  Multiple Vitamin (MULTIVITAMIN WITH MINERALS) TABS tablet Take 1 tablet by mouth daily. 07/21/14   Adonis Brook, NP  naproxen (NAPROSYN) 500 MG tablet Take 1 tablet (500 mg total) by mouth 2 (two) times daily. 12/03/14   Burgess Amor, PA-C  propranolol (INDERAL) 20 MG tablet Take 1 tablet (20 mg total) by mouth 3 (three) times daily. 07/21/14   Adonis Brook, NP  QUEtiapine (SEROQUEL) 300 MG tablet Take 1 tablet (300 mg total) by mouth at bedtime. 07/21/14   Adonis Brook, NP    Family History Family History  Problem Relation Age of Onset  . Schizophrenia Neg Hx     Social  History Social History   Tobacco Use  . Smoking status: Current Every Day Smoker    Packs/day: 1.00    Types: Cigarettes  . Smokeless tobacco: Never Used  Substance Use Topics  . Alcohol use: No  . Drug use: No     Allergies   Other   Review of Systems Review of Systems  Psychiatric/Behavioral: Positive for paranoia.  All other systems reviewed and are negative.    Physical Exam Updated Vital Signs BP (!) 150/86 (BP Location: Right Arm)   Pulse 86   Temp 99.3 F (37.4 C) (Oral)   Resp 18   Ht 6\' 1"  (1.854 m)   Wt 90.7 kg   SpO2 97%   BMI 26.39 kg/m   Physical Exam  Constitutional: He is oriented to person, place, and time. He appears well-developed and well-nourished.  HENT:  Head: Normocephalic.  Right Ear: External ear normal.  Left Ear: External ear normal.  Eyes: EOM are normal.  Neck: Normal range of motion.  Cardiovascular: Normal rate and regular rhythm.  Pulmonary/Chest: Effort normal and breath sounds normal.  Abdominal: He exhibits no distension.  Musculoskeletal: Normal range of motion.  Neurological: He is alert and oriented to person, place, and time.  Skin: Skin is warm.  Psychiatric: He has a normal mood and affect.  Nursing note and vitals reviewed.    ED Treatments / Results  Labs (all labs ordered are listed, but only abnormal results are displayed) Labs Reviewed  COMPREHENSIVE METABOLIC PANEL  ETHANOL  RAPID URINE DRUG SCREEN, HOSP PERFORMED  CBC WITH DIFFERENTIAL/PLATELET    EKG None  Radiology No results found.  Procedures Procedures (including critical care time)  Medications Ordered in ED Medications - No data to display   Initial Impression / Assessment and Plan / ED Course  I have reviewed the triage vital signs and the nursing notes.  Pertinent labs & imaging results that were available during my care of the patient were reviewed by me and considered in my medical decision making (see chart for details).       MDM  Labs ordered,   Final Clinical Impressions(s) / ED Diagnoses   Final diagnoses:  Paranoid schizophrenia (HCC)  Hypokalemia    ED Discharge Orders         Ordered    potassium chloride SA (K-DUR,KLOR-CON) 20 MEQ tablet  2 times daily     03/06/18 0321        An After Visit Summary was printed and given to the patient.    Elson Areas, [place] 03/06/18 1754    Dione Booze, MD 03/06/18 [phone]

## 2018-03-05 NOTE — ED Triage Notes (Signed)
Pt seen here 3 days ago for paranoia, states he was given RX for meds that he states he has at his apartment.  Pt states he does not feel safe going back to his apartment tonight because "people have keys to my apartment and I don't feel safe going back there"   Pt denies SI/HI, states "I don't have any other place to go"

## 2018-03-06 ENCOUNTER — Encounter (HOSPITAL_COMMUNITY): Payer: Self-pay | Admitting: Mental Health

## 2018-03-06 DIAGNOSIS — F200 Paranoid schizophrenia: Secondary | ICD-10-CM | POA: Diagnosis not present

## 2018-03-06 LAB — ETHANOL

## 2018-03-06 LAB — COMPREHENSIVE METABOLIC PANEL
ALBUMIN: 3.9 g/dL (ref 3.5–5.0)
ALT: 33 U/L (ref 0–44)
ANION GAP: 10 (ref 5–15)
AST: 37 U/L (ref 15–41)
Alkaline Phosphatase: 62 U/L (ref 38–126)
BILIRUBIN TOTAL: 0.6 mg/dL (ref 0.3–1.2)
BUN: 6 mg/dL (ref 6–20)
CO2: 22 mmol/L (ref 22–32)
Calcium: 8.4 mg/dL — ABNORMAL LOW (ref 8.9–10.3)
Chloride: 103 mmol/L (ref 98–111)
Creatinine, Ser: 1.11 mg/dL (ref 0.61–1.24)
GFR calc Af Amer: >60 mL/min (ref >60)
Glucose, Bld: 117 mg/dL — ABNORMAL HIGH (ref 70–99)
POTASSIUM: 3 mmol/L — AB (ref 3.5–5.1)
Sodium: 135 mmol/L (ref 135–145)
TOTAL PROTEIN: 6.7 g/dL (ref 6.5–8.1)

## 2018-03-06 LAB — RAPID URINE DRUG SCREEN, HOSP PERFORMED
Amphetamines: NOT DETECTED
BENZODIAZEPINES: NOT DETECTED
Barbiturates: NOT DETECTED
COCAINE: NOT DETECTED
Opiates: NOT DETECTED
Tetrahydrocannabinol: NOT DETECTED

## 2018-03-06 LAB — CBC WITH DIFFERENTIAL/PLATELET
ABS IMMATURE GRANULOCYTES: 0.03 10*3/uL (ref 0.00–0.07)
Basophils Absolute: 0.1 10*3/uL (ref 0.0–0.1)
Basophils Relative: 1 %
Eosinophils Absolute: 0.4 10*3/uL (ref 0.0–0.5)
Eosinophils Relative: 4 %
HCT: 39.5 % (ref 39.0–52.0)
HEMOGLOBIN: 13.2 g/dL (ref 13.0–17.0)
Immature Granulocytes: 0 %
LYMPHS PCT: 29 %
Lymphs Abs: 2.8 10*3/uL (ref 0.7–4.0)
MCH: 28.9 pg (ref 26.0–34.0)
MCHC: 33.4 g/dL (ref 30.0–36.0)
MCV: 86.4 fL (ref 80.0–100.0)
MONOS PCT: 6 %
Monocytes Absolute: 0.6 10*3/uL (ref 0.1–1.0)
NEUTROS ABS: 6 10*3/uL (ref 1.7–7.7)
Neutrophils Relative %: 60 %
Platelets: 321 10*3/uL (ref 150–400)
RBC: 4.57 MIL/uL (ref 4.22–5.81)
RDW: 12.1 % (ref 11.5–15.5)
WBC: 9.8 10*3/uL (ref 4.0–10.5)
nRBC: 0 % (ref 0.0–0.2)

## 2018-03-06 MED ORDER — POTASSIUM CHLORIDE CRYS ER 20 MEQ PO TBCR
40.0000 meq | EXTENDED_RELEASE_TABLET | Freq: Once | ORAL | Status: AC
  Administered 2018-03-06: 40 meq via ORAL
  Filled 2018-03-06: qty 2

## 2018-03-06 MED ORDER — POTASSIUM CHLORIDE CRYS ER 20 MEQ PO TBCR: 20.0000 meq | EXTENDED_RELEASE_TABLET | Freq: Two times a day (BID) | ORAL | 0 refills | Status: DC

## 2018-03-06 NOTE — Discharge Instructions (Signed)
Please make sure you take your medications exactly as they have been prescribed.
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
## 2018-03-17 ENCOUNTER — Emergency Department (HOSPITAL_COMMUNITY)
Admission: EM | Admit: 2018-03-17 | Discharge: 2018-03-18 | Disposition: A | Discharge status: Home / Self Care | Payer: Medicare Other | Attending: Emergency Medicine | Admitting: Emergency Medicine

## 2018-03-17 ENCOUNTER — Encounter (HOSPITAL_COMMUNITY): Payer: Self-pay | Admitting: Emergency Medicine

## 2018-03-17 ENCOUNTER — Other Ambulatory Visit: Payer: Self-pay

## 2018-03-17 DIAGNOSIS — Z79899 Other long term (current) drug therapy: Secondary | ICD-10-CM | POA: Diagnosis not present

## 2018-03-17 DIAGNOSIS — F209 Schizophrenia, unspecified: Secondary | ICD-10-CM | POA: Diagnosis not present

## 2018-03-17 DIAGNOSIS — E876 Hypokalemia: Secondary | ICD-10-CM | POA: Diagnosis not present

## 2018-03-17 DIAGNOSIS — F17210 Nicotine dependence, cigarettes, uncomplicated: Secondary | ICD-10-CM | POA: Insufficient documentation

## 2018-03-17 DIAGNOSIS — F22 Delusional disorders: Secondary | ICD-10-CM | POA: Insufficient documentation

## 2018-03-17 LAB — RAPID URINE DRUG SCREEN, HOSP PERFORMED
AMPHETAMINES: NOT DETECTED
Barbiturates: NOT DETECTED
Benzodiazepines: NOT DETECTED
Cocaine: NOT DETECTED
OPIATES: NOT DETECTED
Tetrahydrocannabinol: NOT DETECTED

## 2018-03-17 LAB — COMPREHENSIVE METABOLIC PANEL
ALT: 26 U/L (ref 0–44)
ANION GAP: 10 (ref 5–15)
AST: 37 U/L (ref 15–41)
Albumin: 4.2 g/dL (ref 3.5–5.0)
Alkaline Phosphatase: 64 U/L (ref 38–126)
BILIRUBIN TOTAL: 0.5 mg/dL (ref 0.3–1.2)
BUN: 10 mg/dL (ref 6–20)
CO2: 22 mmol/L (ref 22–32)
Calcium: 8.7 mg/dL — ABNORMAL LOW (ref 8.9–10.3)
Chloride: 102 mmol/L (ref 98–111)
Creatinine, Ser: 0.94 mg/dL (ref 0.61–1.24)
GFR calc Af Amer: >60 mL/min (ref >60)
GFR calc non Af Amer: >60 mL/min (ref >60)
Glucose, Bld: 104 mg/dL — ABNORMAL HIGH (ref 70–99)
POTASSIUM: 3.1 mmol/L — AB (ref 3.5–5.1)
Sodium: 134 mmol/L — ABNORMAL LOW (ref 135–145)
TOTAL PROTEIN: 7.4 g/dL (ref 6.5–8.1)

## 2018-03-17 LAB — CBC
HCT: 42 % (ref 39.0–52.0)
Hemoglobin: 14.2 g/dL (ref 13.0–17.0)
MCH: 30 pg (ref 26.0–34.0)
MCHC: 33.8 g/dL (ref 30.0–36.0)
MCV: 88.8 fL (ref 80.0–100.0)
NRBC: 0 % (ref 0.0–0.2)
PLATELETS: 336 10*3/uL (ref 150–400)
RBC: 4.73 MIL/uL (ref 4.22–5.81)
RDW: 12.3 % (ref 11.5–15.5)
WBC: 11.2 10*3/uL — ABNORMAL HIGH (ref 4.0–10.5)

## 2018-03-17 LAB — ETHANOL: Alcohol, Ethyl (B): <10 mg/dL (ref <10)

## 2018-03-17 MED ORDER — ALUM & MAG HYDROXIDE-SIMETH 200-200-20 MG/5ML PO SUSP: 30.0000 mL | Freq: Four times a day (QID) | ORAL | Status: DC | PRN

## 2018-03-17 MED ORDER — IBUPROFEN 400 MG PO TABS: 600.0000 mg | ORAL_TABLET | Freq: Four times a day (QID) | ORAL | Status: DC | PRN

## 2018-03-17 MED ORDER — QUETIAPINE FUMARATE 100 MG PO TABS
300.0000 mg | ORAL_TABLET | Freq: Every day | ORAL | Status: DC
  Filled 2018-03-17: qty 3

## 2018-03-17 MED ORDER — LAMOTRIGINE 25 MG PO TABS
75.0000 mg | ORAL_TABLET | Freq: Every day | ORAL | Status: DC
  Filled 2018-03-17: qty 3

## 2018-03-17 MED ORDER — ATORVASTATIN CALCIUM 10 MG PO TABS: 20.0000 mg | ORAL_TABLET | Freq: Every day | ORAL | Status: DC

## 2018-03-17 MED ORDER — POTASSIUM CHLORIDE CRYS ER 20 MEQ PO TBCR
40.0000 meq | EXTENDED_RELEASE_TABLET | Freq: Once | ORAL | Status: DC
  Filled 2018-03-17: qty 2

## 2018-03-17 MED ORDER — LAMOTRIGINE 25 MG PO TABS: 25.0000 mg | ORAL_TABLET | Freq: Every day | ORAL | Status: DC

## 2018-03-17 MED ORDER — BENZTROPINE MESYLATE 1 MG PO TABS: 1.0000 mg | ORAL_TABLET | ORAL | Status: DC

## 2018-03-17 MED ORDER — NICOTINE 21 MG/24HR TD PT24: 21.0000 mg | MEDICATED_PATCH | Freq: Every day | TRANSDERMAL | Status: DC

## 2018-03-17 MED ORDER — PROPRANOLOL HCL 10 MG PO TABS
20.0000 mg | ORAL_TABLET | Freq: Three times a day (TID) | ORAL | Status: DC
  Filled 2018-03-17: qty 2

## 2018-03-17 MED ORDER — FLUPHENAZINE HCL 10 MG PO TABS
10.0000 mg | ORAL_TABLET | Freq: Every morning | ORAL | Status: DC
  Filled 2018-03-17: qty 1

## 2018-03-17 MED ORDER — ACETAMINOPHEN 325 MG PO TABS: 650.0000 mg | ORAL_TABLET | ORAL | Status: DC | PRN

## 2018-03-17 MED ORDER — HYDROXYZINE HCL 25 MG PO TABS: 25.0000 mg | ORAL_TABLET | Freq: Three times a day (TID) | ORAL | Status: DC | PRN

## 2018-03-17 MED ORDER — ONDANSETRON HCL 4 MG PO TABS: 4.0000 mg | ORAL_TABLET | Freq: Three times a day (TID) | ORAL | Status: DC | PRN
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
## 2018-03-17 NOTE — ED Notes (Signed)
Pt provided Bag Sandwich and Chips

## 2018-03-17 NOTE — ED Notes (Signed)
ED Provider at bedside. 
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
## 2018-03-18 DIAGNOSIS — F209 Schizophrenia, unspecified: Secondary | ICD-10-CM | POA: Diagnosis not present

## 2018-03-18 MED ORDER — POTASSIUM CHLORIDE CRYS ER 20 MEQ PO TBCR: 20.0000 meq | EXTENDED_RELEASE_TABLET | Freq: Two times a day (BID) | ORAL | 0 refills | Status: DC

## 2018-03-18 MED ORDER — FLUPHENAZINE HCL 5 MG PO TABS
15.0000 mg | ORAL_TABLET | Freq: Every day | ORAL | Status: DC
  Filled 2018-03-18: qty 1

## 2018-03-18 NOTE — Discharge Instructions (Signed)
Follow up with your psychiatrist. °

## 2018-03-18 NOTE — ED Notes (Signed)
TTS Cart at bedside 
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
## 2018-03-18 NOTE — ED Notes (Signed)
Pt stats "I'm not taking any medications you give me. Potassium messes with me, I only take my Lamictal and Abilify Shot." "I'm not taking anything you try to give me."

## 2018-03-18 NOTE — ED Notes (Signed)
Pt provided warm blanket and once again refused to take medication for his treatment of schizophrenia. MD Notified. Pt states "i'm not gonna take anything until I speak to my doctor about my medications later today."

## 2018-03-18 NOTE — ED Notes (Signed)
[facility] and was told pt could be discharged after light this am; Dr. Preston FleetingGlick informed of decision
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
## 2018-03-19 ENCOUNTER — Other Ambulatory Visit: Payer: Self-pay

## 2018-03-19 ENCOUNTER — Emergency Department (HOSPITAL_COMMUNITY)
Admission: EM | Admit: 2018-03-19 | Discharge: 2018-03-19 | Disposition: A | Discharge status: Home / Self Care | Payer: Medicare Other | Attending: Emergency Medicine | Admitting: Emergency Medicine

## 2018-03-19 ENCOUNTER — Encounter (HOSPITAL_COMMUNITY): Payer: Self-pay | Admitting: Emergency Medicine

## 2018-03-19 DIAGNOSIS — F17210 Nicotine dependence, cigarettes, uncomplicated: Secondary | ICD-10-CM | POA: Insufficient documentation

## 2018-03-19 DIAGNOSIS — F22 Delusional disorders: Secondary | ICD-10-CM

## 2018-03-19 DIAGNOSIS — Z79899 Other long term (current) drug therapy: Secondary | ICD-10-CM | POA: Diagnosis not present

## 2018-03-19 DIAGNOSIS — F200 Paranoid schizophrenia: Secondary | ICD-10-CM | POA: Diagnosis not present

## 2018-03-19 LAB — COMPREHENSIVE METABOLIC PANEL
ALK PHOS: 75 U/L (ref 38–126)
ALT: 29 U/L (ref 0–44)
ANION GAP: 12 (ref 5–15)
AST: 31 U/L (ref 15–41)
Albumin: 4.7 g/dL (ref 3.5–5.0)
BILIRUBIN TOTAL: 0.7 mg/dL (ref 0.3–1.2)
BUN: 7 mg/dL (ref 6–20)
CALCIUM: 9.4 mg/dL (ref 8.9–10.3)
CO2: 24 mmol/L (ref 22–32)
Chloride: 101 mmol/L (ref 98–111)
Creatinine, Ser: 1.04 mg/dL (ref 0.61–1.24)
GFR calc Af Amer: >60 mL/min (ref >60)
GLUCOSE: 106 mg/dL — AB (ref 70–99)
Potassium: 3.2 mmol/L — ABNORMAL LOW (ref 3.5–5.1)
Sodium: 137 mmol/L (ref 135–145)
TOTAL PROTEIN: 8.1 g/dL (ref 6.5–8.1)

## 2018-03-19 LAB — CBC WITH DIFFERENTIAL/PLATELET
Abs Immature Granulocytes: 0.05 10*3/uL (ref 0.00–0.07)
Basophils Absolute: 0.1 10*3/uL (ref 0.0–0.1)
Basophils Relative: 0 %
EOS ABS: 0.2 10*3/uL (ref 0.0–0.5)
EOS PCT: 1 %
HEMATOCRIT: 45.8 % (ref 39.0–52.0)
HEMOGLOBIN: 15.3 g/dL (ref 13.0–17.0)
Immature Granulocytes: 0 %
LYMPHS ABS: 2.8 10*3/uL (ref 0.7–4.0)
LYMPHS PCT: 19 %
MCH: 29.3 pg (ref 26.0–34.0)
MCHC: 33.4 g/dL (ref 30.0–36.0)
MCV: 87.7 fL (ref 80.0–100.0)
MONOS PCT: 5 %
Monocytes Absolute: 0.7 10*3/uL (ref 0.1–1.0)
NRBC: 0 % (ref 0.0–0.2)
Neutro Abs: 10.4 10*3/uL — ABNORMAL HIGH (ref 1.7–7.7)
Neutrophils Relative %: 75 %
Platelets: 366 10*3/uL (ref 150–400)
RBC: 5.22 MIL/uL (ref 4.22–5.81)
RDW: 12.5 % (ref 11.5–15.5)
WBC: 14.2 10*3/uL — ABNORMAL HIGH (ref 4.0–10.5)

## 2018-03-19 LAB — RAPID URINE DRUG SCREEN, HOSP PERFORMED
Amphetamines: NOT DETECTED
BARBITURATES: NOT DETECTED
Benzodiazepines: NOT DETECTED
Cocaine: NOT DETECTED
Opiates: NOT DETECTED
Tetrahydrocannabinol: NOT DETECTED

## 2018-03-19 LAB — ETHANOL

## 2018-03-19 MED ORDER — QUETIAPINE FUMARATE 100 MG PO TABS: 300.0000 mg | ORAL_TABLET | Freq: Every day | ORAL | Status: DC

## 2018-03-19 MED ORDER — PROPRANOLOL HCL 10 MG PO TABS: 20.0000 mg | ORAL_TABLET | Freq: Three times a day (TID) | ORAL | Status: DC

## 2018-03-19 MED ORDER — POTASSIUM CHLORIDE CRYS ER 20 MEQ PO TBCR: 40.0000 meq | EXTENDED_RELEASE_TABLET | Freq: Once | ORAL | Status: DC

## 2018-03-19 MED ORDER — ATORVASTATIN CALCIUM 10 MG PO TABS: 20.0000 mg | ORAL_TABLET | Freq: Every day | ORAL | Status: DC

## 2018-03-19 MED ORDER — FLUPHENAZINE HCL 10 MG PO TABS
15.0000 mg | ORAL_TABLET | Freq: Every evening | ORAL | Status: DC
  Filled 2018-03-19: qty 2

## 2018-03-19 MED ORDER — FLUPHENAZINE HCL 10 MG PO TABS
10.0000 mg | ORAL_TABLET | Freq: Every morning | ORAL | Status: DC
  Filled 2018-03-19: qty 1

## 2018-03-19 MED ORDER — BENZTROPINE MESYLATE 1 MG PO TABS: 1.0000 mg | ORAL_TABLET | ORAL | Status: DC

## 2018-03-19 MED ORDER — HYDROXYZINE HCL 25 MG PO TABS: 25.0000 mg | ORAL_TABLET | Freq: Three times a day (TID) | ORAL | Status: DC | PRN

## 2018-03-19 NOTE — ED Triage Notes (Signed)
Patient states he is paranoid. Unsure of when symptoms started. Patient states he is not safe where he is staying. Cooperative in Triage.

## 2018-03-19 NOTE — Progress Notes (Signed)
Dr. Ranae PalmsYelverton and Nurse, Gearldine BienenstockBrandy informed of pt disposition.
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
## 2018-03-19 NOTE — ED Notes (Signed)
Patient was screened by security in Triage.

## 2018-03-20 ENCOUNTER — Other Ambulatory Visit: Payer: Self-pay

## 2018-03-20 NOTE — Patient Outreach (Signed)
[facility] Care Management  03/20/2018  [name] 06-07-1975 [id number]   Referral Date: 03/20/18 Referral Source: [facility] Referral Reason: Multiple Ed visits, paranoid Schizophrenia   Outreach Attempt: no answer.  Unable to leave a voice message.  States VM not set up.   Plan: RN CM will attempt again within 4 business days and send a letter.   [name], RN, MSN [facility] Care Management Care Management Coordinator Direct Line [phone] Toll Free: [phone]  Fax: [phone]

## 2018-03-22 ENCOUNTER — Emergency Department (HOSPITAL_COMMUNITY)
Admission: EM | Admit: 2018-03-22 | Discharge: 2018-03-23 | Disposition: A | Discharge status: Home / Self Care | Payer: Medicare Other | Attending: Emergency Medicine | Admitting: Emergency Medicine

## 2018-03-22 ENCOUNTER — Encounter (HOSPITAL_COMMUNITY): Payer: Self-pay | Admitting: Emergency Medicine

## 2018-03-22 ENCOUNTER — Other Ambulatory Visit: Payer: Self-pay

## 2018-03-22 DIAGNOSIS — F22 Delusional disorders: Secondary | ICD-10-CM | POA: Diagnosis present

## 2018-03-22 DIAGNOSIS — Z79899 Other long term (current) drug therapy: Secondary | ICD-10-CM | POA: Diagnosis not present

## 2018-03-22 DIAGNOSIS — F17210 Nicotine dependence, cigarettes, uncomplicated: Secondary | ICD-10-CM | POA: Diagnosis not present

## 2018-03-22 DIAGNOSIS — F200 Paranoid schizophrenia: Secondary | ICD-10-CM

## 2018-03-22 MED ORDER — ONDANSETRON HCL 4 MG PO TABS: 4.0000 mg | ORAL_TABLET | Freq: Three times a day (TID) | ORAL | Status: DC | PRN

## 2018-03-22 MED ORDER — ACETAMINOPHEN 325 MG PO TABS: 650.0000 mg | ORAL_TABLET | ORAL | Status: DC | PRN

## 2018-03-22 NOTE — ED Provider Notes (Signed)
[facility] EMERGENCY DEPARTMENT Provider Note   CSN: [id number] Arrival date & time: 03/22/18  2234     History   Chief Complaint Chief Complaint  Patient presents with  . Paranoid    HPI HASHIR DELEEUW is a 42 y.o. male.  HPI 42 year old male with history of paranoid schizophrenia, bipolar disorder and hyperlipidemia comes in with chief complaint of paranoia.  Patient states that he has been feeling paranoid for the past few days.  He thinks that he is not safe at home and he has been seeing red lights flashing in his room.  Patient also thinks that his parents are stealing from him.  He denies any SI, HI and also denies any substance abuse.  Patient has not seen his outpatient health care provider.  Patient has been seen in the ED twice within the last 10 days with symptoms of paranoia.  Past Medical History:  Diagnosis Date  . Bipolar disorder (HCC)   . Hyperlipidemia   . Paranoid schizophrenia (HCC)   . Schizophrenia [facility])     Patient Active Problem List   Diagnosis Date Noted  . Tobacco use disorder   . Schizoaffective disorder, bipolar type (HCC) 06/29/2014    Past Surgical History:  Procedure Laterality Date  . TESTICLE SURGERY          Home Medications    Prior to Admission medications   Medication Sig Start Date End Date Taking? Authorizing Provider  ABILIFY MAINTENA 400 MG PRSY prefilled syringe Inject 400 mg into the muscle every 28 (twenty-eight) days.  03/11/18  Yes [provider]  benztropine (COGENTIN) 1 MG tablet Take 1 tablet (1 mg total) by mouth 2 (two) times daily in the am and at bedtime.. Patient taking differently: Take 1 mg by mouth 3 (three) times daily.  07/21/14  Yes Adonis Brook, NP  lamoTRIgine (LAMICTAL) 100 MG tablet Take 100-200 mg by mouth See admin instructions. 100mg  in the morning and 200mg  in the evening 03/11/18  Yes [provider]    Family History Family History  Problem Relation Age of Onset  .  Schizophrenia Neg Hx     Social History Social History   Tobacco Use  . Smoking status: Current Every Day Smoker    Packs/day: 1.00    Types: Cigarettes  . Smokeless tobacco: Never Used  Substance Use Topics  . Alcohol use: No  . Drug use: No     Allergies   Other   Review of Systems Review of Systems  Constitutional: Positive for activity change.  Respiratory: Negative for shortness of breath.   Cardiovascular: Negative for chest pain.  Gastrointestinal: Negative for abdominal pain, nausea and vomiting.  Psychiatric/Behavioral: Negative for suicidal ideas.  All other systems reviewed and are negative.    Physical Exam Updated Vital Signs BP 129/83 (BP Location: Right Arm)   Pulse 92   Temp 98.3 F (36.8 C) (Oral)   Resp 17   Ht 6\' 1"  (1.854 m)   Wt 90.7 kg   SpO2 98%   BMI 26.38 kg/m   Physical Exam  Constitutional: He is oriented to person, place, and time. He appears well-developed.  HENT:  Head: Atraumatic.  Neck: Neck supple.  Cardiovascular: Normal rate.  Pulmonary/Chest: Effort normal.  Abdominal: Soft.  Neurological: He is alert and oriented to person, place, and time.  Skin: Skin is warm.  Nursing note and vitals reviewed.    ED Treatments / Results  Labs (all labs ordered are listed,  but only abnormal results are displayed) Labs Reviewed  RAPID URINE DRUG SCREEN, HOSP PERFORMED    EKG None  Radiology No results found.  Procedures Procedures (including critical care time)  Medications Ordered in ED Medications  acetaminophen (TYLENOL) tablet 650 mg (has no administration in time range)  ondansetron (ZOFRAN) tablet 4 mg (has no administration in time range)     Initial Impression / Assessment and Plan / ED Course  I have reviewed the triage vital signs and the nursing notes.  Pertinent labs & imaging results that were available during my care of the patient were reviewed by me and considered in my medical decision making  (see chart for details).     42 year old male comes in with chief complaint of paranoia. He is noted to be paranoid, stating that there are people trying to kill him and that his parents are stealing from him. He has been seen in the ER twice in the last 10 days [facility]  It does not appear that patient has followed up with anyone in the outpatient setting.  We will consult TTS to see if patient needs admission for optimization, given the failed outpatient management thus far.  Patient is medically cleared for psych evaluation.  I do not see any reason to get medical labs at this time.  Final Clinical Impressions(s) / ED Diagnoses   Final diagnoses:  Paranoid schizophrenia [facility](HCC)    ED Discharge Orders    None       Derwood KaplanNanavati, Gabi Mcfate, MD 03/22/18 2344

## 2018-03-22 NOTE — ED Triage Notes (Signed)
Pt states he is paranoid. States he needs to be sent away somewhere because people are out to get him. Pt denies any SI/HI.

## 2018-03-23 DIAGNOSIS — F200 Paranoid schizophrenia: Secondary | ICD-10-CM | POA: Diagnosis not present

## 2018-03-23 LAB — RAPID URINE DRUG SCREEN, HOSP PERFORMED
Amphetamines: NOT DETECTED
BARBITURATES: NOT DETECTED
BENZODIAZEPINES: NOT DETECTED
Cocaine: NOT DETECTED
Opiates: NOT DETECTED
Tetrahydrocannabinol: NOT DETECTED

## 2018-03-23 NOTE — Discharge Instructions (Signed)
[facility]

## 2018-03-23 NOTE — ED Notes (Signed)
Donell SievertSpencer Simon, PA, patient does not meet inpatient criteria, [facility]. RN notified of disposition.

## 2018-03-23 NOTE — ED Notes (Signed)
Pt continues to pace room talking to himself, states he does not want anything from staff.
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
## 2018-03-24 ENCOUNTER — Other Ambulatory Visit: Payer: Self-pay

## 2018-03-24 NOTE — Patient Outreach (Signed)
[facility]) Care Management  03/24/2018  [name] 1976-03-21 [id number]   Referral Date: 03/20/18 Referral Source: [facility] Referral Reason: Multiple Ed visits, paranoid Schizophrenia   Outreach Attempt: no answer.  Unable to leave a voice message.  States VM not set up.   Plan: RN CM will attempt again within 4 business days.  Bary Lericheionne J Joncarlo Friberg, RN, MSN [facility] Care Management Care Management Coordinator Direct Line [phone] Cell [phone] Toll Free: [phone]  Fax: [phone]

## 2018-03-25 ENCOUNTER — Other Ambulatory Visit: Payer: Self-pay

## 2018-03-25 NOTE — Patient Outreach (Signed)
[facility]) Care Management  03/25/2018  [name] 10-21-1975 [id number]   Referral Date:03/20/18 Referral Source:[facility] Referral Reason:Multiple Ed visits, paranoid Schizophrenia   Outreach Attempt:no answer. Unable to leave a voice message. States VM not set up.   Plan:RN CM will wait return call.  If no return call will close case.    Bary Lericheionne J Chetara Kropp, RN, MSN [facility] Care Management Care Management Coordinator Direct Line [phone] Cell [phone] Toll Free: [phone]  Fax: [phone]

## 2018-03-26 ENCOUNTER — Encounter (HOSPITAL_COMMUNITY): Payer: Self-pay | Admitting: Emergency Medicine

## 2018-03-26 ENCOUNTER — Other Ambulatory Visit: Payer: Self-pay

## 2018-03-26 ENCOUNTER — Emergency Department (HOSPITAL_COMMUNITY)
Admission: EM | Admit: 2018-03-26 | Discharge: 2018-03-26 | Disposition: A | Discharge status: Home / Self Care | Payer: Medicare Other | Attending: Emergency Medicine | Admitting: Emergency Medicine

## 2018-03-26 DIAGNOSIS — F17210 Nicotine dependence, cigarettes, uncomplicated: Secondary | ICD-10-CM | POA: Insufficient documentation

## 2018-03-26 DIAGNOSIS — Z79899 Other long term (current) drug therapy: Secondary | ICD-10-CM | POA: Insufficient documentation

## 2018-03-26 DIAGNOSIS — F200 Paranoid schizophrenia: Secondary | ICD-10-CM | POA: Diagnosis not present

## 2018-03-26 DIAGNOSIS — F22 Delusional disorders: Secondary | ICD-10-CM

## 2018-03-26 NOTE — Discharge Instructions (Addendum)
Please follow up with Dr Rosalia Hammersay and your ACT team.
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
## 2018-03-26 NOTE — ED Notes (Signed)
Pt talking with TTS  
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
## 2018-03-26 NOTE — ED Notes (Signed)
Pt states "I just don't feel safe in my house because my parents have keys to my house and they aren't supposed to.  I need to be sent off to Behavioral Health"   Pt is calm, cooperative, pleasant.

## 2018-04-01 ENCOUNTER — Emergency Department (HOSPITAL_COMMUNITY)
Admission: EM | Admit: 2018-04-01 | Discharge: 2018-04-02 | Disposition: A | Discharge status: Home / Self Care | Payer: Medicare Other | Attending: Emergency Medicine | Admitting: Emergency Medicine

## 2018-04-01 ENCOUNTER — Encounter (HOSPITAL_COMMUNITY): Payer: Self-pay | Admitting: Emergency Medicine

## 2018-04-01 ENCOUNTER — Other Ambulatory Visit: Payer: Self-pay

## 2018-04-01 DIAGNOSIS — F22 Delusional disorders: Secondary | ICD-10-CM

## 2018-04-01 DIAGNOSIS — F17210 Nicotine dependence, cigarettes, uncomplicated: Secondary | ICD-10-CM | POA: Diagnosis not present

## 2018-04-01 DIAGNOSIS — Z79899 Other long term (current) drug therapy: Secondary | ICD-10-CM | POA: Insufficient documentation

## 2018-04-01 DIAGNOSIS — F200 Paranoid schizophrenia: Secondary | ICD-10-CM | POA: Insufficient documentation
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
## 2018-04-01 NOTE — Discharge Instructions (Addendum)
Substance Abuse Treatment Programs ° °Intensive Outpatient Programs °[facility]     °[address]      °[place], [place]                   °[phone]      ° °[facility] °[address] °[place], [place] °336-379-7146 ° °[facility]     °(Inpatient and outpatient)     °[address]           °[phone]   ° °[facility] °[phone] (Suboxone and Methadone) ° °[address]      °[place], [place] [address]      °336-882-2125      ° °[address] °[place], [place] °852-3033 ° °[facility] (Outpatient/Inpatient, Chemical)    °(insurance only) [phone]      °       °[facility] (Groups & Residential) °[place], [place] °[phone] ° °   °[facility]     °[address]     °[place], [place]      °[phone]      ° °[facility] (for caregivers and family) °[address] °[place], [place] °336-299-4655 ° ° ° ° ° °Residential Treatment Programs °[facility]      °[address], [place], [place] [address]  °([phone]      ° °[facility] °[address] [place], [place] [address] °[phone] ° °[facility]        °336-248-8914      ° °[facility] °1-800-659-3381 ° °[facility]             °[address]                                         °[place], [place]                                                °877-615-2722 or 336-784-9470                              ° °[facility] [place] °[address] °[place], [address] °1.877.941.8954 ° °[facility]    °[address]      °[place], [place]     °[phone]      ° °[facility] °[address] °[place], [place] °336-285-9073 ° °[facility]   °[address]     °[place], [place] [address]     °[phone]      °Admissions: 8am-3pm M-F ° °[facility] °[address] °[place] °336-227-7417 ° °[facility]: Residential Program (90 Days)   °[place], [place]      °336-725-8389 or 800-758-6077    ° °[facility] °[place], [place] °(Walk in Hours over the weekend or by referral) ° °[facility] °[address], [place], [place] [address] °([phone] ° °Crisis Mobile: [facility]:  1-877-626-1772 (for crisis response 24 hours a day) °[facility] Hotline:      1-800-256-2452 °Outpatient Psychiatry and Counseling ° °[facility]: Mobile Crisis   Management 24 hours:  1-877-626-1772 ° °[facility] sliding scale fee and walk in schedule: M-F 8am-12pm/1pm-3pm °[address]  °[place], [place] [address] °[phone] ° °[facility] °[address] °[place], [place] [address] °336-703-9650 ° °[facility] ([facility])- new patient walk-in appointments available Monday - Friday 8am -3pm.          °[address] °[place], [place] [address] °336-676-6840 or crisis line- 336-676-6905 ° °[facility]/ Intensive Outpatient Therapy Program °[address] °[place], [place] [address] °336-832-9804 ° °[facility]                  °Crisis Services      °336.641.4993      °[address]     °[place], [place] [address]                ° °[facility]   °[facility] °[phone] °[address] °[place], [place] [address] ° ° °[facility]          °[address]  °[place], [place] [address]       °([phone] [phone] ° °[facility] °[address] °[place], [place] [address] °336-292-1510 ° °[facility]    °[address]    °[place], [place] [address]     °336-632-3505      ° °[facility], [name]     °[address]     °[place] [address]     °336-282-1251     °  °[facility] °[address] °[place] [address] ° °[facility]     °[address]     °[place], [place]      °336-542-2076      ° °[facility] °James Ahluwalia, [name] °[address] °[place], [place] [address] °336-420-9558 ° °[facility]     °[address]     °[place], [place] [address]     °336-274-1237      ° °[facility] °[address] °[place], [place] [address] °336-854-4450 °Resources for Temporary Residential Assistance/Crisis Centers ° °DAY CENTERS °[facility] °M-F 8am-3pm   °[address] [place], [place] [address]   336-332-0824 °Services include: laundry, barbering, support groups, case management, phone  & computer access, showers, AA/NA mtgs, mental health/substance abuse nurse, job skills class, disability information, VA assistance, spiritual classes, etc.  ° °HOMELESS SHELTERS ° °[facility]     °[facility]   °[address], [place]     °336.271.5959       °       °[facility] (women and children)       °[address] °[place], [place] [address] °336-275-0820 °[email or web address] for application and process °Application Required ° °[facility]   °[address]    °[place] [address]     °336.886.4922       °             °[facility] °[address] °,  [address] °336.273.5572 °336-235-0363(schedule application appt.) °Application Required ° °[facility] (women only)    °[address]     °[place],  [address]     °336-884-1039      °  Intake starts 6pm daily °Need valid ID, SSC, & Police report °[facility] [place] °[address] °[place], [place] °336-881-5420 °Application Required ° °[facility] (men only)     °[address]      °[place], [place]     °[phone]      ° °[facility] °(Pregnant women only) °[address] °[place], [place] °336-275-0206 ° °[facility]      °[address]      °[place], [place] [address]     °336-722-9951      °       °[facility] °[address] °[place], [place] °336-723-1848 °90 day commitment/SA/Application process ° °[facility](men only)     °[address]     °[place], [place]     °336-748-1962       °Check-in at 7pm     °       °[facility] °[address] °[place], [place] [address] °336-248-6684 °Men/Women/Women and Children must be there by 7 pm ° °[facility] °[place], [place] °336-722-8721                ° °

## 2018-04-01 NOTE — ED Triage Notes (Signed)
Patient reports not sleeping and he feels paranoid.

## 2018-04-03 ENCOUNTER — Other Ambulatory Visit: Payer: Self-pay

## 2018-04-03 NOTE — Patient Outreach (Signed)
[facility] Care Management  04/03/2018  Nichola SizerBrian T Cozzolino 03-12-1976 [id number]   Multiple attempts to establish contact with patient without success. No response from letter mailed to patient.   Plan: RN CM will close case at this time.   Bary Lericheionne J Garreth Burnsworth, RN, MSN [facility] Care Management Care Management Coordinator Direct Line [phone] Cell [phone] Toll Free: [phone]  Fax: [phone]

## 2018-04-19 ENCOUNTER — Other Ambulatory Visit: Payer: Self-pay

## 2018-04-19 ENCOUNTER — Emergency Department (HOSPITAL_COMMUNITY)
Admission: EM | Admit: 2018-04-19 | Discharge: 2018-04-20 | Disposition: A | Discharge status: Home / Self Care | Payer: Medicare Other | Attending: Emergency Medicine | Admitting: Emergency Medicine

## 2018-04-19 ENCOUNTER — Encounter (HOSPITAL_COMMUNITY): Payer: Self-pay | Admitting: *Deleted

## 2018-04-19 DIAGNOSIS — Z79899 Other long term (current) drug therapy: Secondary | ICD-10-CM | POA: Insufficient documentation

## 2018-04-19 DIAGNOSIS — F200 Paranoid schizophrenia: Secondary | ICD-10-CM | POA: Insufficient documentation

## 2018-04-19 DIAGNOSIS — F17210 Nicotine dependence, cigarettes, uncomplicated: Secondary | ICD-10-CM | POA: Insufficient documentation

## 2018-04-19 NOTE — Discharge Instructions (Addendum)
Substance Abuse Treatment Programs ° °Intensive Outpatient Programs °[facility]     °[address]      °[place], [place]                   °[phone]      ° °[facility] °[address] °[place], [place] °336-379-7146 ° °[facility]     °(Inpatient and outpatient)     °[address]           °336-832-9800   ° °[facility] °336-288-1484 (Suboxone and Methadone) ° °[address]      °[place], [place] [address]      °336-882-2125      ° °[address] °[place], [place] °852-3033 ° °[facility] (Outpatient/Inpatient, [facility])    °(insurance only) [phone]      °       °[facility] (Groups & Residential) °[place], [place] °336-389-1413 ° °   °[facility]     °[address]     °[place], [place]      °336-389-1413      ° °[facility] (for caregivers and family) °[address] °[place], [place] °336-299-4655 ° ° ° ° ° °Residential Treatment Programs °[facility]      °[address], [place], [place] [address]  °(336) [phone]      ° °[facility] °[address] [place], [place] [address] °919-419-1059 ° °[facility]        °336-248-8914      ° °[facility] °1-800-659-3381 ° °[facility]             °[address]                                         °[place], [place]                                                °877-615-2722 or 336-784-9470                              ° °[facility] [place] °[address] °[place], [address] °1.877.941.8954 ° °[facility]    °[address]      °[place], [place]     °336-273-5306      ° °[facility] °[address] °[place], [place] °336-285-9073 ° °[facility]   °[address]     °[place], [place] [address]     °336-899-1550      °Admissions: 8am-3pm M-F ° °[facility] °[address] °[place],  [place] °336-227-7417 ° °[facility]: Residential Program (90 Days)   °[place], [place]      °336-725-8389 or 800-758-6077    ° °[facility] °[place], [place] °(Walk in Hours over the weekend or by referral) ° °[facility] °[address], [place],  [address] °(336) [phone] ° °Crisis Mobile: [facility]:  1-877-626-1772 (for crisis response 24 hours a day) °[facility] Hotline:      1-800-256-2452 °Outpatient Psychiatry and Counseling ° °[facility]: Mobile Crisis   Management 24 hours:  1-877-626-1772 ° °[facility] sliding scale fee and walk in schedule: M-F 8am-12pm/1pm-3pm °[address]  °[place], [place] [address] °[phone] ° °[facility] °[address] °[place], [place] [address] °336-703-9650 ° °[facility] ([facility])- new patient walk-in appointments available Monday - Friday 8am -3pm.          °[address] °[place], [place] [address] °336-676-6840 or crisis line- 336-676-6905 ° °[facility]/ Intensive Outpatient Therapy Program °[address] °[place], [place] [address] °336-832-9804 ° °[facility]                  °Crisis Services      °336.641.4993      °[address]     °[place], [place] [address]                ° °[facility]   °[facility] °[phone] °[address] °[place], [place] [address] ° ° °[facility]          °[address],  °[place], [place] [address]       °(336) [phone] ° °[facility] °[address] °[place], [place] [address] °336-292-1510 ° °[facility]    °[address]    °[place], [place] [address]     °336-632-3505      ° °Parish McKinney, MD     °[address]     °[place] [address]     °336-282-1251     °  °[facility] °[address] °[place] [address] ° °[facility]     °[address]     °[place], [place]      °336-542-2076      ° °[facility] °Shamsher Ahluwalia, MD °[address] °[place], [place] [address] °336-420-9558 ° °[facility]     °[address]     °[place], [place] [address]     °336-274-1237      ° °[facility] °[address] °[place], [place] [address] °336-854-4450 °Resources for Temporary Residential Assistance/Crisis Centers ° °[facility] °[facility] °M-F 8am-3pm   °[address] [place], [place] [address]   336-332-0824 °Services include: laundry, barbering, support groups, case management, phone  & computer access, showers, AA/NA mtgs, mental health/substance abuse nurse, job skills class, disability information, VA assistance, spiritual classes, etc.  ° °HOMELESS SHELTERS ° °[facility]     °[facility]   °[address], [place]     °336.271.5959       °       °[facility] (women and children)       °[address] °, [place] [address] °336-275-0820 °[email or web address] for application and process °Application Required ° °[facility]   °[address]    °[place] [address]     °336.886.4922       °             °[facility] °[address] °, [place] [address] °336.273.5572 °336-235-0363(schedule application appt.) °Application Required ° °[facility] (women only)    °[address]     °[place],  [address]     °336-884-1039      °  Intake starts 6pm daily °Need valid ID, SSC, & Police report °[facility] [place] °[address] °[place], [place] °336-881-5420 °Application Required ° °[facility] (men only)     °[address]      °[place], [place]     °[phone]      ° °[facility] °(Pregnant women only) °[address] °[place], [place] °336-275-0206 ° °[facility]      °[address]      °[place], [place] [address]     °336-722-9951      °       °[facility] °[address] °[place], [place] °336-723-1848 °90 day commitment/SA/Application process ° °[facility](men only)     °[address]     °[place], [place]     °336-748-1962       °Check-in at 7pm     °       °[facility] °[address] °[place], [place] [address] °336-248-6684 °Men/Women/Women and Children must be there by 7 pm ° °[facility] °[place], [place] °336-722-8721                ° °
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
## 2018-04-19 NOTE — ED Notes (Signed)
Pt requesting a shower at this time. Pt stating that he feels paranoid but denies SI/HI and denies any auditory or visual hallucinations.

## 2018-04-20 NOTE — ED Notes (Signed)
Pt ambulatory to waiting room. Pt verbalized understanding of discharge instructions.   

## 2018-04-21 ENCOUNTER — Emergency Department (HOSPITAL_COMMUNITY)
Admission: EM | Admit: 2018-04-21 | Discharge: 2018-04-21 | Disposition: A | Discharge status: Home / Self Care | Payer: Medicare Other | Attending: Emergency Medicine | Admitting: Emergency Medicine

## 2018-04-21 ENCOUNTER — Other Ambulatory Visit: Payer: Self-pay

## 2018-04-21 ENCOUNTER — Encounter (HOSPITAL_COMMUNITY): Payer: Self-pay | Admitting: Emergency Medicine

## 2018-04-21 ENCOUNTER — Emergency Department (HOSPITAL_COMMUNITY)
Admission: EM | Admit: 2018-04-21 | Discharge: 2018-04-22 | Disposition: A | Discharge status: Home / Self Care | Payer: Medicare Other | Source: Home / Self Care | Attending: Emergency Medicine | Admitting: Emergency Medicine

## 2018-04-21 ENCOUNTER — Encounter (HOSPITAL_COMMUNITY): Payer: Self-pay

## 2018-04-21 DIAGNOSIS — Z79899 Other long term (current) drug therapy: Secondary | ICD-10-CM

## 2018-04-21 DIAGNOSIS — R4182 Altered mental status, unspecified: Secondary | ICD-10-CM | POA: Diagnosis present

## 2018-04-21 DIAGNOSIS — F319 Bipolar disorder, unspecified: Secondary | ICD-10-CM | POA: Diagnosis not present

## 2018-04-21 DIAGNOSIS — F17210 Nicotine dependence, cigarettes, uncomplicated: Secondary | ICD-10-CM | POA: Insufficient documentation

## 2018-04-21 DIAGNOSIS — F209 Schizophrenia, unspecified: Secondary | ICD-10-CM | POA: Insufficient documentation

## 2018-04-21 DIAGNOSIS — F22 Delusional disorders: Secondary | ICD-10-CM

## 2018-04-21 LAB — COMPREHENSIVE METABOLIC PANEL
ALBUMIN: 4.1 g/dL (ref 3.5–5.0)
ALT: 16 U/L (ref 0–44)
AST: 18 U/L (ref 15–41)
Alkaline Phosphatase: 61 U/L (ref 38–126)
Anion gap: 7 (ref 5–15)
BILIRUBIN TOTAL: 0.4 mg/dL (ref 0.3–1.2)
BUN: 8 mg/dL (ref 6–20)
CHLORIDE: 106 mmol/L (ref 98–111)
CO2: 26 mmol/L (ref 22–32)
Calcium: 8.9 mg/dL (ref 8.9–10.3)
Creatinine, Ser: 0.74 mg/dL (ref 0.61–1.24)
GFR calc Af Amer: >60 mL/min (ref >60)
GFR calc non Af Amer: >60 mL/min (ref >60)
GLUCOSE: 103 mg/dL — AB (ref 70–99)
POTASSIUM: 3.5 mmol/L (ref 3.5–5.1)
Sodium: 139 mmol/L (ref 135–145)
Total Protein: 7 g/dL (ref 6.5–8.1)

## 2018-04-21 LAB — CBC WITH DIFFERENTIAL/PLATELET
ABS IMMATURE GRANULOCYTES: 0.02 10*3/uL (ref 0.00–0.07)
BASOS ABS: 0 10*3/uL (ref 0.0–0.1)
Basophils Relative: 0 %
Eosinophils Absolute: 0.2 10*3/uL (ref 0.0–0.5)
Eosinophils Relative: 2 %
HEMATOCRIT: 41.5 % (ref 39.0–52.0)
Hemoglobin: 13.8 g/dL (ref 13.0–17.0)
IMMATURE GRANULOCYTES: 0 %
LYMPHS ABS: 2.1 10*3/uL (ref 0.7–4.0)
LYMPHS PCT: 20 %
MCH: 29.4 pg (ref 26.0–34.0)
MCHC: 33.3 g/dL (ref 30.0–36.0)
MCV: 88.3 fL (ref 80.0–100.0)
Monocytes Absolute: 0.5 10*3/uL (ref 0.1–1.0)
Monocytes Relative: 4 %
NEUTROS ABS: 7.8 10*3/uL — AB (ref 1.7–7.7)
NEUTROS PCT: 74 %
NRBC: 0 % (ref 0.0–0.2)
Platelets: 361 10*3/uL (ref 150–400)
RBC: 4.7 MIL/uL (ref 4.22–5.81)
RDW: 12.2 % (ref 11.5–15.5)
WBC: 10.6 10*3/uL — ABNORMAL HIGH (ref 4.0–10.5)

## 2018-04-21 LAB — ETHANOL: Alcohol, Ethyl (B): <10 mg/dL (ref <10)

## 2018-04-21 NOTE — ED Triage Notes (Signed)
Pt seen here earlier this evening and discharged. Pt with chronic paranoia, states he went home and believes someone was in his apartment. Pt denies SI or HI.  Pt is calm and cooperative.

## 2018-04-21 NOTE — ED Notes (Signed)
Patient informed that a urine sample is required.
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
## 2018-04-21 NOTE — ED Provider Notes (Signed)
[facility] Provider [facility]   CSN: [id number] Arrival date & time: 04/21/18  1845     History   Chief Complaint Chief Complaint  Patient presents with  . Paranoid    HPI [name] is a 42 y.o. male.  Patient states he is paranoid.  He feels like people are out to get him.  Patient is not suicidal not homicidal and not having auditory hallucinations  The history is provided by the patient. No language interpreter was used.  Altered Mental Status   This is a chronic problem. The current episode started more than 2 days ago. The problem has not changed since onset.Pertinent negatives include no confusion, no seizures and no hallucinations. Risk factors: History of paranoid schizophrenia. His past medical history does not include seizures.    Past Medical History:  Diagnosis Date  . Bipolar disorder (HCC)   . Hyperlipidemia   . Paranoid schizophrenia (HCC)   . Schizophrenia [facility](HCC)     Patient Active Problem List   Diagnosis Date Noted  . Tobacco use disorder   . Schizoaffective disorder, bipolar type (HCC) 06/29/2014    Past Surgical History:  Procedure Laterality Date  . TESTICLE SURGERY          Home Medications    Prior to Admission medications   Medication Sig Start Date End Date Taking? Authorizing Provider  ABILIFY MAINTENA 400 MG PRSY prefilled syringe Inject 400 mg into the muscle every 28 (twenty-eight) days.  03/11/18  Yes [provider]  benztropine (COGENTIN) 1 MG tablet Take 1 tablet (1 mg total) by mouth 2 (two) times daily in the am and at bedtime.. Patient taking differently: Take 1 mg by mouth 2 (two) times daily.  07/21/14  Yes [name], [name], NP  buPROPion (WELLBUTRIN XL) 150 MG 24 hr tablet Take 150 mg by mouth every morning.   Yes [provider]  fluPHENAZine (PROLIXIN) 5 MG tablet Take 20 mg by mouth 2 (two) times daily.   Yes [provider]  lamoTRIgine (LAMICTAL) 100 MG tablet Take 200  mg by mouth 2 (two) times daily.  03/11/18  Yes [provider]  traZODone (DESYREL) 50 MG tablet Take 50 mg by mouth at bedtime.  03/28/18  Yes [provider]    Family History Family History  Problem Relation Age of Onset  . Schizophrenia Neg Hx     Social History Social History   Tobacco Use  . Smoking status: Current Every Day Smoker    Packs/day: 1.00    Types: Cigarettes  . Smokeless tobacco: Never Used  Substance Use Topics  . Alcohol use: No  . Drug use: No     Allergies   Other   Review of Systems Review of Systems  Constitutional: Negative for appetite change and fatigue.  HENT: Negative for congestion, ear discharge and sinus pressure.   Eyes: Negative for discharge.  Respiratory: Negative for cough.   Cardiovascular: Negative for chest pain.  Gastrointestinal: Negative for abdominal pain and diarrhea.  Genitourinary: Negative for frequency and hematuria.  Musculoskeletal: Negative for back pain.  Skin: Negative for rash.  Neurological: Negative for seizures and headaches.  Psychiatric/Behavioral: Negative for confusion and hallucinations.       Paranoid     Physical Exam Updated Vital Signs BP (!) 156/90 (BP Location: Right Arm)   Pulse 88   Temp 99.1 F (37.3 C) (Oral)   Resp 18   SpO2 98%   Physical Exam Vitals signs  reviewed.  Constitutional:      Appearance: He is well-developed.  HENT:     Head: Normocephalic.     Nose: Nose normal.  Eyes:     General: No scleral icterus.    Conjunctiva/sclera: Conjunctivae normal.  Neck:     Musculoskeletal: Neck supple.     Thyroid: No thyromegaly.  Cardiovascular:     Rate and Rhythm: Normal rate and regular rhythm.     Heart sounds: No murmur. No friction rub. No gallop.   Pulmonary:     Breath sounds: No stridor. No wheezing or rales.  Chest:     Chest wall: No tenderness.  Abdominal:     General: There is no distension.     Tenderness: There is no abdominal  tenderness. There is no rebound.  Musculoskeletal: Normal range of motion.  Lymphadenopathy:     Cervical: No cervical adenopathy.  Skin:    Findings: No erythema or rash.  Neurological:     Mental Status: He is oriented to person, place, and time.     Motor: No abnormal muscle tone.     Coordination: Coordination normal.  Psychiatric:     Comments: Not suicidal not homicidal not having hallucinations but he is paranoid      ED Treatments / Results  Labs (all labs ordered are listed, but only abnormal results are displayed) Labs Reviewed  CBC WITH DIFFERENTIAL/PLATELET - Abnormal; Notable for the following components:      Result Value   WBC 10.6 (*)    Neutro Abs 7.8 (*)    All other components within normal limits  COMPREHENSIVE METABOLIC PANEL - Abnormal; Notable for the following components:   Glucose, Bld 103 (*)    All other components within normal limits  ETHANOL  RAPID URINE DRUG SCREEN, HOSP PERFORMED    EKG None  Radiology No results found.  Procedures Procedures (including critical care time)  Medications Ordered in ED Medications - No data to display   Initial Impression / Assessment and Plan / ED Course  I have reviewed the triage vital signs and the nursing notes.  Pertinent labs & imaging results that were available during my care of the patient were reviewed by me and considered in my medical decision making (see chart for details).   Patient with paranoid schizophrenia.  He was evaluated by behavioral health and it was felt the patient could be discharged home and follow-up as an outpatient    Final Clinical Impressions(s) / ED Diagnoses   Final diagnoses:  Paranoid behavior [facility](HCC)    ED Discharge Orders    None       [name], Christopherjame Carnell, MD 04/21/18 2139

## 2018-04-22 NOTE — ED Notes (Signed)
Pt discharged. Left without papers or signing
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
## 2018-04-23 ENCOUNTER — Emergency Department (HOSPITAL_COMMUNITY)
Admission: EM | Admit: 2018-04-23 | Discharge: 2018-04-23 | Disposition: A | Discharge status: Home / Self Care | Payer: Medicare Other | Attending: Emergency Medicine | Admitting: Emergency Medicine

## 2018-04-23 ENCOUNTER — Emergency Department (HOSPITAL_COMMUNITY)
Admission: EM | Admit: 2018-04-23 | Discharge: 2018-04-23 | Disposition: A | Discharge status: Home / Self Care | Payer: Medicare Other | Source: Home / Self Care | Attending: Emergency Medicine | Admitting: Emergency Medicine

## 2018-04-23 ENCOUNTER — Encounter (HOSPITAL_COMMUNITY): Payer: Self-pay | Admitting: *Deleted

## 2018-04-23 ENCOUNTER — Encounter (HOSPITAL_COMMUNITY): Payer: Self-pay | Admitting: Emergency Medicine

## 2018-04-23 ENCOUNTER — Other Ambulatory Visit: Payer: Self-pay

## 2018-04-23 DIAGNOSIS — Z79899 Other long term (current) drug therapy: Secondary | ICD-10-CM | POA: Insufficient documentation

## 2018-04-23 DIAGNOSIS — F319 Bipolar disorder, unspecified: Secondary | ICD-10-CM | POA: Insufficient documentation

## 2018-04-23 DIAGNOSIS — F200 Paranoid schizophrenia: Secondary | ICD-10-CM

## 2018-04-23 DIAGNOSIS — F17210 Nicotine dependence, cigarettes, uncomplicated: Secondary | ICD-10-CM | POA: Insufficient documentation

## 2018-04-23 DIAGNOSIS — R4689 Other symptoms and signs involving appearance and behavior: Secondary | ICD-10-CM | POA: Diagnosis present

## 2018-04-23 LAB — CBC WITH DIFFERENTIAL/PLATELET
Abs Immature Granulocytes: 0.03 10*3/uL (ref 0.00–0.07)
Basophils Absolute: 0.1 10*3/uL (ref 0.0–0.1)
Basophils Relative: 0 %
Eosinophils Absolute: 0.2 10*3/uL (ref 0.0–0.5)
Eosinophils Relative: 2 %
HCT: 41.1 % (ref 39.0–52.0)
Hemoglobin: 13.9 g/dL (ref 13.0–17.0)
Immature Granulocytes: 0 %
LYMPHS ABS: 2.3 10*3/uL (ref 0.7–4.0)
Lymphocytes Relative: 20 %
MCH: 30.1 pg (ref 26.0–34.0)
MCHC: 33.8 g/dL (ref 30.0–36.0)
MCV: 89 fL (ref 80.0–100.0)
Monocytes Absolute: 0.6 10*3/uL (ref 0.1–1.0)
Monocytes Relative: 6 %
Neutro Abs: 8.1 10*3/uL — ABNORMAL HIGH (ref 1.7–7.7)
Neutrophils Relative %: 72 %
Platelets: 387 10*3/uL (ref 150–400)
RBC: 4.62 MIL/uL (ref 4.22–5.81)
RDW: 12.1 % (ref 11.5–15.5)
WBC: 11.2 10*3/uL — ABNORMAL HIGH (ref 4.0–10.5)
nRBC: 0 % (ref 0.0–0.2)

## 2018-04-23 LAB — COMPREHENSIVE METABOLIC PANEL
ALK PHOS: 59 U/L (ref 38–126)
ALT: 14 U/L (ref 0–44)
AST: 18 U/L (ref 15–41)
Albumin: 4.3 g/dL (ref 3.5–5.0)
Anion gap: 10 (ref 5–15)
BUN: 9 mg/dL (ref 6–20)
CALCIUM: 9.5 mg/dL (ref 8.9–10.3)
CO2: 24 mmol/L (ref 22–32)
Chloride: 106 mmol/L (ref 98–111)
Creatinine, Ser: 0.67 mg/dL (ref 0.61–1.24)
GFR calc Af Amer: >60 mL/min (ref >60)
GFR calc non Af Amer: >60 mL/min (ref >60)
Glucose, Bld: 125 mg/dL — ABNORMAL HIGH (ref 70–99)
Potassium: 3.3 mmol/L — ABNORMAL LOW (ref 3.5–5.1)
Sodium: 140 mmol/L (ref 135–145)
Total Bilirubin: 0.8 mg/dL (ref 0.3–1.2)
Total Protein: 7.5 g/dL (ref 6.5–8.1)

## 2018-04-23 LAB — ETHANOL: Alcohol, Ethyl (B): <10 mg/dL (ref <10)

## 2018-04-23 MED ORDER — BENZTROPINE MESYLATE 1 MG PO TABS
1.0000 mg | ORAL_TABLET | ORAL | Status: AC
  Administered 2018-04-23: 1 mg via ORAL
  Filled 2018-04-23: qty 1

## 2018-04-23 MED ORDER — ARIPIPRAZOLE ER 400 MG IM SRER
400.0000 mg | INTRAMUSCULAR | Status: AC
  Administered 2018-04-23: 400 mg via INTRAMUSCULAR

## 2018-04-23 MED ORDER — POTASSIUM CHLORIDE CRYS ER 20 MEQ PO TBCR
40.0000 meq | EXTENDED_RELEASE_TABLET | Freq: Once | ORAL | Status: DC
  Filled 2018-04-23: qty 2

## 2018-04-23 NOTE — ED Provider Notes (Addendum)
[facility]   CSN: [id number] Arrival date & time: 04/23/18  0330     History   Chief Complaint Chief Complaint  Patient presents with  . Hallucinations    HPI [name] is a 42 y.o. male.  HPI   He is here for evaluation of sleeplessness, and states he is not taking his trazodone.  He cannot recall the last time he took his Abilify.  Patient was transfused here by EMS, after he was found at a convenience store, and was harassing customers.  Police were apparently on the scene.  Patient denies other issues at this time.  He states he spent time with family members yesterday for the Christmas holiday.  He has been in the emergency department twice in the last 48 hours for evaluation of recurrent paranoia symptoms.  Was discharged both times, seen by TTS, once.  Patient has multiple similar visits to this emergency department.  There are no other known modifying factors.  Past Medical History:  Diagnosis Date  . Bipolar disorder (HCC)   . Hyperlipidemia   . Paranoid schizophrenia (HCC)   . Schizophrenia [facility](HCC)     Patient Active Problem List   Diagnosis Date Noted  . Tobacco use disorder   . Schizoaffective disorder, bipolar type (HCC) 06/29/2014    Past Surgical History:  Procedure Laterality Date  . TESTICLE SURGERY          Home Medications    Prior to Admission medications   Medication Sig Start Date End Date Taking? Authorizing Provider  ABILIFY MAINTENA 400 MG PRSY prefilled syringe Inject 400 mg into the muscle every 28 (twenty-eight) days.  03/11/18   [provider]  benztropine (COGENTIN) 1 MG tablet Take 1 tablet (1 mg total) by mouth 2 (two) times daily in the am and at bedtime.. Patient taking differently: Take 1 mg by mouth 2 (two) times daily.  07/21/14   [name], [name], NP  buPROPion (WELLBUTRIN XL) 150 MG 24 hr tablet Take 150 mg by mouth every morning.    [provider]  fluPHENAZine  (PROLIXIN) 5 MG tablet Take 20 mg by mouth 2 (two) times daily.    [provider]  lamoTRIgine (LAMICTAL) 100 MG tablet Take 200 mg by mouth 2 (two) times daily.  03/11/18   [provider]  traZODone (DESYREL) 50 MG tablet Take 50 mg by mouth at bedtime.  03/28/18   [provider]    Family History Family History  Problem Relation Age of Onset  . Schizophrenia Neg Hx     Social History Social History   Tobacco Use  . Smoking status: Current Every Day Smoker    Packs/day: 1.00    Types: Cigarettes  . Smokeless tobacco: Never Used  Substance Use Topics  . Alcohol use: No  . Drug use: No     Allergies   Other   Review of Systems Review of Systems   Physical Exam Updated Vital Signs BP (!) 154/92 (BP Location: Right Arm)   Pulse 86   Temp 98.1 F (36.7 C) (Oral)   Resp 18   Ht 6\' 1"  (1.854 m)   Wt 90.7 kg   SpO2 100%   BMI 26.39 kg/m   Physical Exam Vitals signs and nursing note reviewed.  Constitutional:      Appearance: Normal appearance. He is well-developed. He is not ill-appearing or diaphoretic.  HENT:     Head: Normocephalic and atraumatic.  Right Ear: External ear normal.     Left Ear: External ear normal.  Eyes:     Conjunctiva/sclera: Conjunctivae normal.     Pupils: Pupils are equal, round, and reactive to light.  Neck:     Musculoskeletal: Normal range of motion and neck supple.     Trachea: Phonation normal.  Cardiovascular:     Rate and Rhythm: Normal rate and regular rhythm.     Heart sounds: Normal heart sounds.  Pulmonary:     Effort: Pulmonary effort is normal.     Breath sounds: Normal breath sounds.  Abdominal:     Palpations: Abdomen is soft.     Tenderness: There is no abdominal tenderness.  Musculoskeletal: Normal range of motion.  Skin:    General: Skin is warm and dry.  Neurological:     Mental Status: He is alert and oriented to person, place, and time. Mental status is at baseline.      Cranial Nerves: No cranial nerve deficit.     Sensory: No sensory deficit.     Motor: No abnormal muscle tone.     Coordination: Coordination normal.  Psychiatric:        Behavior: Behavior normal.        Thought Content: Thought content normal.        Judgment: Judgment normal.      ED Treatments / Results  Labs (all labs ordered are listed, but only abnormal results are displayed) Labs Reviewed - No data to display  EKG None  Radiology No results found.  Procedures Procedures (including critical care time)  Medications Ordered in ED Medications - No data to display   Initial Impression / Assessment and Plan / ED Course  I have reviewed the triage vital signs and the nursing notes.  Pertinent labs & imaging results that were available during my care of the patient were reviewed by me and considered in my medical decision making (see chart for details).      Patient Vitals for the past 24 hrs:  BP Temp Temp src Pulse Resp SpO2 Height Weight  04/23/18 0614 - - - - - - 6\' 1"  (1.854 m) 90.7 kg  04/23/18 0613 (!) 154/92 98.1 F (36.7 C) Oral 86 18 100 % - -    8:42 AM Reevaluation with update and discussion. After initial assessment and treatment, an updated evaluation reveals no change in clinical status, he is resting comfortably.  Currently sleeping, arouses easily.  Findings discussed and questions answered. Mancel Bale   Medical Decision Making: Stable chronic psychosis.  No indication for further ED treatment at this time.  Patient frequently comes to the emergency department.  He has an outpatient team following his psychiatric care and access to follow-up at the local psychiatric treatment facility.  CRITICAL CARE-no Performed by: Mancel Bale   Nursing Notes Reviewed/ Care Coordinated Applicable Imaging Reviewed Interpretation of Laboratory Data incorporated into ED treatment  The patient appears reasonably screened and/or stabilized for discharge and I  doubt any other medical condition or other [facility] requiring further screening, evaluation, or treatment in the ED at this time prior to discharge.  Plan: Home Medications-continue current medications; Home Treatments-rest; return here if the recommended treatment, does not improve the symptoms; Recommended follow up-psychiatry as scheduled, and as needed     Final Clinical Impressions(s) / ED Diagnoses   Final diagnoses:  Bipolar affective disorder, remission status unspecified [facility])    ED Discharge Orders    None  Mancel BaleWentz, Anacarolina Evelyn, MD 04/23/18 Mora [id number]    Mancel BaleWentz, Milus Fritze, MD 04/23/18 [phone]

## 2018-04-23 NOTE — Consult Note (Signed)
Tele psych Assessment   James SizerBrian T [name], 42 y.o., male patient seen via tele psych by TTS and this provider; chart reviewed and consulted with Dr. Lucianne MussKumar on 04/23/18.  On evaluation James SizerBrian T [name] reports he doesn't know why he is in the hospital.  "They make up these lies on me."  Patient denies suicidal/self-harm/homicidal ideation and psychosis.  Patient also denies paranoia but made the statement "someone removed the chip from my phone and my Facebook ain't even my Facebook on the phone."  ACT team member Kandis MannanOmar is at bedside with patient and states that this is patients base line.  Also informs that patient was suppose to get his Abilify Maintena 400 mg injection today but it was missed related to patient being in hospital.  States if the hospital doesn't carry he has the injection at the office and can bring it in if a nurse can give it to patient prior to patient leaving hospital.  States that the nurse that usually gives injection is off work and won't see patient for 2 more days or more.    Spoke with patient's nurse Donnelly Angelicahristy Hyatt, RN states that hospital does not have Abilify Maintena in stock but if ACT team member brings medication it can be given prior to patients discharge.    During evaluation James SizerBrian T [name] is sitting up in bed; he/she is alert/oriented x 3; calm/cooperative; and mood congruent with affect.  Patient is speaking in a clear tone at moderate volume, and normal pace; with good eye contact.  His thought process is coherent and relevant; but does exhibit some paranoia related to his cell phone chip being removed and someone else's Facebook on his phone.  There is no indication that he is currently responding to internal/external stimuli.  Patient denies suicidal/self-harm/homicidal ideation, psychosis.  Patient has remained calm throughout assessment and has answered questions appropriately to the best of his ability.     For detailed note see TTS tele assessment  note  Recommendations:  Give Abilify Maintena 400 mg IM prior to discharge.  Kandis Mannanmar (ACT team member) will bring medication to hospital from office.    Disposition:  Patient is psychiatrically cleared; patient at base line  No evidence of imminent risk to self or others at present.   Patient does not meet criteria for psychiatric inpatient admission.  Dr. Criss AlvineGoldston did not answer phone with patient at this time.  Spoke Salvisahristy Hyatt, [place] informed of above recommendation, disposition, and order to give Abilify and cogentin prior to patient discharge.  States that she will inform Dr. Linard MillersGoldston  Iysha Mishkin, NP

## 2018-04-23 NOTE — ED Triage Notes (Signed)
Pt brought in by [facility] for c/o hallucinations; [facility] [employer] and was talking to customers coming in and [employer] was called and pt states he was told to come to the ED; pt is talking about how he then ran to his apartment and was hearing voices in the walls; pt was picked up by ems at an apartment complex

## 2018-04-23 NOTE — ED Notes (Signed)
Spoke with NP Shuvon Rankin.  She states pt should be cleared for d/c after receiving Abilify inj.  Staff from [facility] is here with pt and will go to office to retrieve injection.  Per pharmacy we don't stock this mediation at the hospital.  edp and charge notified.

## 2018-04-23 NOTE — ED Triage Notes (Signed)
[place] [facility] [place] unsigned [place]C paperwork by family stating patient is not taking his psych medications and has been verbally abusive to his mother. States he is hearing voices and paranoid. Officer states he is going to get paperwork signed and is leaving patient until he returns. Patient denies SI/HI. Patient is agreeable to stay until officer returns to ED. Patient is voluntary until officer returns.

## 2018-04-23 NOTE — ED Notes (Signed)
[facility]

## 2018-04-23 NOTE — ED Notes (Signed)
Rescinded IVC papers faxed to Magistrate. 

## 2018-04-23 NOTE — ED Notes (Signed)
Attempted to contact someone from [facility] to let know pt is being d/c.  No on answered call was sent to voicemail.  Called back and spoke with receptionist, she states she will forward me to that department.  Told her that call was being sent to voicemail. She states she didn't have a way to get in touch with anyone.

## 2018-04-23 NOTE — Discharge Instructions (Addendum)
Follow-up at [facility] as needed for problems and treatment.  Call your  ACT counselor if needed.

## 2018-04-23 NOTE — ED Notes (Signed)
Signed off [place] pd officer [name] at this time

## 2018-04-23 NOTE — ED Provider Notes (Signed)
[facility] EMERGENCY DEPARTMENT Provider Note   CSN: [id number] Arrival date & time: 04/23/18  1207     History   Chief Complaint Chief Complaint  Patient presents with  . V70.1    HPI [name] is a 42 y.o. male.  HPI  42 year old male presents under IVC.  Patient states he is not sure why he was involuntarily committed by his mom.  He states his uncle and his mom are out to get him as well as the police.  He was at a pawn shop and states that that is when his uncle went and got the papers.  He denies being aggressive, injuring people or threatening people, or having suicidal/homicidal thoughts.  Police at the bedside state that the mother reports he has been aggressive towards her and acting odd. He was seen here earlier this AM but states people were out to get him then.  Past Medical History:  Diagnosis Date  . Bipolar disorder (HCC)   . Hyperlipidemia   . Paranoid schizophrenia (HCC)   . Schizophrenia [facility](HCC)     Patient Active Problem List   Diagnosis Date Noted  . Tobacco use disorder   . Schizoaffective disorder, bipolar type (HCC) 06/29/2014    Past Surgical History:  Procedure Laterality Date  . TESTICLE SURGERY          Home Medications    Prior to Admission medications   Medication Sig Start Date End Date Taking? Authorizing Provider  ABILIFY MAINTENA 400 MG PRSY prefilled syringe Inject 400 mg into the muscle every 28 (twenty-eight) days.  03/11/18  Yes [provider]  benztropine (COGENTIN) 1 MG tablet Take 1 tablet (1 mg total) by mouth 2 (two) times daily in the am and at bedtime.. Patient taking differently: Take 1 mg by mouth 2 (two) times daily.  07/21/14  Yes [name], [name], NP  lamoTRIgine (LAMICTAL) 100 MG tablet Take 200 mg by mouth 2 (two) times daily.  03/11/18  Yes [provider]  traZODone (DESYREL) 50 MG tablet Take 50 mg by mouth at bedtime.  03/28/18  Yes [provider]    Family History Family  History  Problem Relation Age of Onset  . Schizophrenia Neg Hx     Social History Social History   Tobacco Use  . Smoking status: Current Every Day Smoker    Packs/day: 1.00    Types: Cigarettes  . Smokeless tobacco: Never Used  Substance Use Topics  . Alcohol use: No  . Drug use: No     Allergies   Other   Review of Systems Review of Systems  Psychiatric/Behavioral: Negative for agitation and suicidal ideas.     Physical Exam Updated Vital Signs BP (!) 142/85 (BP Location: Right Arm)   Pulse 77   Temp 98.4 F (36.9 C) (Oral)   Resp 18   Ht 6\' 1"  (1.854 m)   Wt 89.4 kg   SpO2 100%   BMI 25.99 kg/m   Physical Exam Vitals signs and nursing note reviewed.  Constitutional:      General: He is not in acute distress.    Appearance: He is well-developed. He is not ill-appearing or diaphoretic.  HENT:     Head: Normocephalic and atraumatic.     Right Ear: External ear normal.     Left Ear: External ear normal.     Nose: Nose normal.  Eyes:     General:        Right eye: No discharge.  Left eye: No discharge.  Neck:     Musculoskeletal: Neck supple.  Cardiovascular:     Rate and Rhythm: Normal rate and regular rhythm.     Heart sounds: Normal heart sounds.  Pulmonary:     Effort: Pulmonary effort is normal.     Breath sounds: Normal breath sounds.  Abdominal:     General: There is no distension.  Skin:    General: Skin is warm and dry.  Neurological:     Mental Status: He is alert.  Psychiatric:        Mood and Affect: Mood is not anxious.        Behavior: Behavior is not agitated or aggressive.        Thought Content: Thought content is paranoid. Thought content does not include homicidal or suicidal ideation.      ED Treatments / Results  Labs (all labs ordered are listed, but only abnormal results are displayed) Labs Reviewed  COMPREHENSIVE METABOLIC PANEL - Abnormal; Notable for the following components:      Result Value    Potassium 3.3 (*)    Glucose, Bld 125 (*)    All other components within normal limits  CBC WITH DIFFERENTIAL/PLATELET - Abnormal; Notable for the following components:   WBC 11.2 (*)    Neutro Abs 8.1 (*)    All other components within normal limits  ETHANOL  RAPID URINE DRUG SCREEN, HOSP PERFORMED    EKG None  Radiology No results found.  Procedures Procedures (including critical care time)  Medications Ordered in ED Medications  potassium chloride SA (K-DUR,KLOR-CON) CR tablet 40 mEq (40 mEq Oral Refused 04/23/18 1611)  ARIPiprazole ER (ABILIFY MAINTENA) injection 400 mg (400 mg Intramuscular Given 04/23/18 1607)  benztropine (COGENTIN) tablet 1 mg (1 mg Oral Given 04/23/18 1608)     Initial Impression / Assessment and Plan / ED Course  I have reviewed the triage vital signs and the nursing notes.  Pertinent labs & imaging results that were available during my care of the patient were reviewed by me and considered in my medical decision making (see chart for details).     Psychiatry has seen patient and cleared.  The patient does not appear agitated or acutely altered.  He has chronic paranoia but is not suicidal, homicidal, or immediately a threat to anyone else at this time.  Psychiatry recommends he get his Abilify injection which his act team has obtained.  Otherwise he will be discharged into their care and appears stable for him an emergency standpoint to follow-up as an outpatient.  Final Clinical Impressions(s) / ED Diagnoses   Final diagnoses:  Paranoid schizophrenia [facility](HCC)    ED Discharge Orders    None       Pricilla LovelessGoldston, Riaz Onorato, MD 04/23/18 [phone]

## 2018-04-23 NOTE — BH Assessment (Signed)
Tele Assessment Note   Patient Name: [name] MRN: [id number] Referring Physician: EDP Location of Patient: [facility] Location of Provider: Behavioral Health TTS Department  [name] is a 42 y.o. male who presented to [facility] under IVC (petitioner is mother, [name] -- [phone]) due to continued paranoia and other symptoms.  Pt has a standing diagnosis of Schizophrenia, and this is his 11th presentation to the ED this year and his 7th TTS consult this year for similar complaints.  Pt lives alone in an apartment complex dedicated to people with mental illness.  Pt is on disability, and he is followed by [facility].  Per IVC, Pt has acted erratically since Thanksgiving.  Yesterday, Pt was acting in a bizarre manner at mother's home, whispering and yelling.  Also per IVC, Pt is not taking his medication property, hears voices, and believes that someone is trying to kill him.  Also per IVC -- Pt is beating on walls, is verbally abusive to mother.  Chartered loss adjuster spoke with [name] mother who confirmed that Pt has acted in a bizarre manner over the last few weeks, that he is paranoid, and that he believes people are trying to kill him.  Mother did not see Pt today.  Author spoke with Pt.  Pt denied suicidal ideation, homicidal ideation, hallucination, and self-injurious behavior.  Pt said he did not know why he was at hospital today.  He stated that he is taking medication.  Pt has been assessed several times this month, including an assessment on TTS on 12/24.  On that day, Pt came to the ED because he had lost his keys and did not know where else to go.  During assessment, Pt presented as alert and oriented.  He had good eye contact and was cooperative.  Pt was dressed in scrubs, and he appeared appropriately groomed.  [name] demeanor was pleasant and calm.  Mood was euthymic, and affect was mood-congruent.  Pt denied current suicidal, homicidal, self-injurious, and hallucination symptoms,  although it may be that he continues to have delusion.  Pt denied, but he did endorse recent delusion.  [name] speech was normal in rate, rhythm, and volume.  Thought processes were circumstantial.  Pt has a history of delusion.  [name] memory and concentration were fair.  Insight, judgment and impulse control were fair.  Consulted with S. Rankin, NP, who determined that Pt is psych-cleared.  Pt will be discharged to [facility] ACT Team.   Diagnosis: [facility]  Past Medical History:  Past Medical History:  Diagnosis Date  . Bipolar disorder (HCC)   . Hyperlipidemia   . Paranoid schizophrenia (HCC)   . Schizophrenia [facility])     Past Surgical History:  Procedure Laterality Date  . TESTICLE SURGERY      Family History:  Family History  Problem Relation Age of Onset  . Schizophrenia Neg Hx     Social History:  reports that he has been smoking cigarettes. He has been smoking about 1.00 pack per day. He has never used smokeless tobacco. He reports that he does not drink alcohol or use drugs.  Additional Social History:  Alcohol / Drug Use Pain Medications: See MAR Prescriptions: See MAR Over the Counter: Seee MAR History of alcohol / drug use?: No history of alcohol / drug abuse  CIWA: CIWA-Ar BP: (!) 148/88 Pulse Rate: 88 COWS:    Allergies:  Allergies  Allergen Reactions  . Other     Some trial medicine from [facility].  Home Medications: (Not in a hospital admission)   OB/GYN Status:  No LMP for male patient.  General Assessment Data Location of Assessment: [facility] TTS Assessment: In system Is this a Tele or Face-to-Face Assessment?: Tele Assessment Is this an Initial Assessment or a Re-assessment for this encounter?: Initial Assessment Patient Accompanied by:: N/A Language Other than English: No Living Arrangements: Other (Comment)(Apartment complex) What gender do you identify as?: Male Marital status: Single Pregnancy Status: No Living Arrangements: Alone Can pt return  to current living arrangement?: Yes Admission Status: Involuntary Petitioner: (Mother -- [phone]) Is patient capable of signing voluntary admission?: Yes Referral Source: Self/Family/Friend [facility]     Crisis Care Plan Living Arrangements: Alone Name of Psychiatrist: [facility] Name of Therapist: [facility]  Education Status Is patient currently in school?: No Is the patient employed, unemployed or receiving disability?: Receiving disability income  Risk to self with the past 6 months Suicidal Ideation: No Has patient been a risk to self within the past 6 months prior to admission? : No Suicidal Intent: No Has patient had any suicidal intent within the past 6 months prior to admission? : No Is patient at risk for suicide?: No Suicidal Plan?: No Has patient had any suicidal plan within the past 6 months prior to admission? : No Access to Means: No What has been your use of drugs/alcohol within the last 12 months?: Denied Previous Attempts/Gestures: No Other Self Harm Risks: None indicated Triggers for Past Attempts: None known Intentional Self Injurious Behavior: None Family Suicide History: No Recent stressful life event(s): Other (Comment)(Paranoid delusion) Persecutory voices/beliefs?: No Depression: No Depression Symptoms: (None indicated) Substance abuse history and/or treatment for substance abuse?: No Suicide prevention information given to non-admitted patients: Not applicable  Risk to Others within the past 6 months Homicidal Ideation: No Does patient have any lifetime risk of violence toward others beyond the six months prior to admission? : No Thoughts of Harm to Others: No Current Homicidal Intent: No Current Homicidal Plan: No Access to Homicidal Means: No History of harm to others?: No Assessment of Violence: None Noted Does patient have access to weapons?: No Criminal Charges Pending?: No Does patient have a court date:  No Is patient on probation?: No  Psychosis Hallucinations: None noted Delusions: Persecutory  Mental Status Report Appearance/Hygiene: Unremarkable Eye Contact: Good Motor Activity: Freedom of movement Speech: Logical/coherent Level of Consciousness: Alert Mood: Euthymic Affect: Appropriate to circumstance Anxiety Level: None Thought Processes: Circumstantial Judgement: Partial Orientation: Person, Place, Time, Situation Obsessive Compulsive Thoughts/Behaviors: None  Cognitive Functioning Concentration: Normal Memory: Remote Intact, Recent Intact Is patient IDD: No Insight: Fair Impulse Control: Fair Appetite: Good Have you had any weight changes? : No Change Sleep: No Change Vegetative Symptoms: None  ADLScreening [facility] Assessment Services) Patient's cognitive ability adequate to safely complete daily activities?: Yes Patient able to express need for assistance with ADLs?: Yes Independently performs ADLs?: Yes (appropriate for developmental age)  Prior Inpatient Therapy Prior Inpatient Therapy: Yes Prior Therapy Dates: 06/2014, multiple admits [facility] for Treatment: Sch[facility]  Prior Outpatient Therapy Prior Outpatient Therapy: Yes Prior Therapy Dates: Ongoing Prior Therapy Facilty/Provider(s): [facility] Reason for Treatment: Medication management and counseling services Does patient have an ACCT team?: Yes Does patient have Intensive In-House Services?  : No Does patient have [facility] services? : No Does patient have P4CC services?: No  ADL Screening (condition at time of admission) Patient's cognitive ability adequate to safely complete  daily activities?: Yes Patient able to express need for assistance with ADLs?: Yes Independently performs ADLs?: Yes (appropriate for developmental age)       Abuse/Neglect Assessment (Assessment to be complete while patient is alone) Physical Abuse: Denies Verbal  Abuse: Denies Sexual Abuse: Denies Exploitation of patient/patient's resources: Denies Self-Neglect: Denies Values / Beliefs Cultural Requests During Hospitalization: None Spiritual Requests During Hospitalization: None Consults Spiritual Care Consult Needed: No Social Work Consult Needed: No Merchant navy officerAdvance Directives (For Healthcare) Does Patient Have a Medical Advance Directive?: No Would patient like information on creating a medical advance directive?: No - Patient declined          Disposition:  Disposition Initial Assessment Completed for this Encounter: Yes Disposition of Patient: Discharge Patient referred to: Other (Comment)([facility])  This service was provided via telemedicine using a 2-way, interactive audio and video technology.  Names of all persons participating in this telemedicine service and their role in this encounter. Name: Sheryn BisonGunter, James Role: Patient  Vaughan Bastaatree, James Mother          Earline Mayotteugene T Najla Aughenbaugh 04/23/2018 2:49 PM

## 2018-04-23 NOTE — [facility] Counselor (Signed)
Attempted assessment.  Pt not ready yet.

## 2018-04-28 DIAGNOSIS — Z79899 Other long term (current) drug therapy: Secondary | ICD-10-CM | POA: Diagnosis not present

## 2018-04-30 ENCOUNTER — Other Ambulatory Visit: Payer: Self-pay

## 2018-04-30 ENCOUNTER — Emergency Department (HOSPITAL_COMMUNITY)
Admission: EM | Admit: 2018-04-30 | Discharge: 2018-04-30 | Disposition: A | Discharge status: Home / Self Care | Payer: Medicare Other | Attending: Emergency Medicine | Admitting: Emergency Medicine

## 2018-04-30 ENCOUNTER — Encounter (HOSPITAL_COMMUNITY): Payer: Self-pay | Admitting: Emergency Medicine

## 2018-04-30 DIAGNOSIS — R4182 Altered mental status, unspecified: Secondary | ICD-10-CM | POA: Diagnosis present

## 2018-04-30 DIAGNOSIS — E876 Hypokalemia: Secondary | ICD-10-CM | POA: Diagnosis not present

## 2018-04-30 DIAGNOSIS — Z9114 Patient's other noncompliance with medication regimen: Secondary | ICD-10-CM | POA: Diagnosis not present

## 2018-04-30 DIAGNOSIS — F312 Bipolar disorder, current episode manic severe with psychotic features: Secondary | ICD-10-CM | POA: Diagnosis not present

## 2018-04-30 DIAGNOSIS — R45851 Suicidal ideations: Secondary | ICD-10-CM | POA: Diagnosis not present

## 2018-04-30 DIAGNOSIS — Z79899 Other long term (current) drug therapy: Secondary | ICD-10-CM | POA: Insufficient documentation

## 2018-04-30 DIAGNOSIS — F17210 Nicotine dependence, cigarettes, uncomplicated: Secondary | ICD-10-CM | POA: Insufficient documentation

## 2018-04-30 DIAGNOSIS — R000 Tachycardia, unspecified: Secondary | ICD-10-CM | POA: Diagnosis not present

## 2018-04-30 DIAGNOSIS — F209 Schizophrenia, unspecified: Secondary | ICD-10-CM | POA: Diagnosis not present

## 2018-04-30 DIAGNOSIS — F3110 Bipolar disorder, current episode manic without psychotic features, unspecified: Secondary | ICD-10-CM

## 2018-04-30 DIAGNOSIS — R451 Restlessness and agitation: Secondary | ICD-10-CM | POA: Diagnosis not present

## 2018-04-30 LAB — COMPREHENSIVE METABOLIC PANEL
ALT: 17 U/L (ref 0–44)
AST: 21 U/L (ref 15–41)
Albumin: 4.3 g/dL (ref 3.5–5.0)
Alkaline Phosphatase: 66 U/L (ref 38–126)
Anion gap: 8 (ref 5–15)
BUN: 11 mg/dL (ref 6–20)
CO2: 25 mmol/L (ref 22–32)
Calcium: 9.5 mg/dL (ref 8.9–10.3)
Chloride: 106 mmol/L (ref 98–111)
Creatinine, Ser: 1.12 mg/dL (ref 0.61–1.24)
GFR calc Af Amer: >60 mL/min (ref >60)
GFR calc non Af Amer: >60 mL/min (ref >60)
Glucose, Bld: 114 mg/dL — ABNORMAL HIGH (ref 70–99)
Potassium: 3.3 mmol/L — ABNORMAL LOW (ref 3.5–5.1)
SODIUM: 139 mmol/L (ref 135–145)
Total Bilirubin: 0.4 mg/dL (ref 0.3–1.2)
Total Protein: 7.3 g/dL (ref 6.5–8.1)

## 2018-04-30 LAB — CBC WITH DIFFERENTIAL/PLATELET
Abs Immature Granulocytes: 0.04 10*3/uL (ref 0.00–0.07)
Basophils Absolute: 0 10*3/uL (ref 0.0–0.1)
Basophils Relative: 0 %
Eosinophils Absolute: 0.1 10*3/uL (ref 0.0–0.5)
Eosinophils Relative: 1 %
HCT: 41 % (ref 39.0–52.0)
HEMOGLOBIN: 13.8 g/dL (ref 13.0–17.0)
Immature Granulocytes: 0 %
LYMPHS PCT: 10 %
Lymphs Abs: 1.3 10*3/uL (ref 0.7–4.0)
MCH: 29.9 pg (ref 26.0–34.0)
MCHC: 33.7 g/dL (ref 30.0–36.0)
MCV: 88.7 fL (ref 80.0–100.0)
Monocytes Absolute: 0.6 10*3/uL (ref 0.1–1.0)
Monocytes Relative: 5 %
Neutro Abs: 10.7 10*3/uL — ABNORMAL HIGH (ref 1.7–7.7)
Neutrophils Relative %: 84 %
Platelets: 364 10*3/uL (ref 150–400)
RBC: 4.62 MIL/uL (ref 4.22–5.81)
RDW: 12.1 % (ref 11.5–15.5)
WBC: 12.8 10*3/uL — ABNORMAL HIGH (ref 4.0–10.5)
nRBC: 0 % (ref 0.0–0.2)

## 2018-04-30 LAB — MAGNESIUM: Magnesium: 1.9 mg/dL (ref 1.7–2.4)

## 2018-04-30 LAB — ETHANOL: Alcohol, Ethyl (B): <10 mg/dL (ref <10)

## 2018-04-30 MED ORDER — POTASSIUM CHLORIDE CRYS ER 20 MEQ PO TBCR
40.0000 meq | EXTENDED_RELEASE_TABLET | Freq: Once | ORAL | Status: AC
  Administered 2018-04-30: 40 meq via ORAL
  Filled 2018-04-30: qty 2

## 2018-04-30 MED ORDER — TRAZODONE HCL 50 MG PO TABS: 50.0000 mg | ORAL_TABLET | Freq: Every day | ORAL | Status: DC

## 2018-04-30 MED ORDER — ARIPIPRAZOLE 5 MG PO TABS
15.0000 mg | ORAL_TABLET | Freq: Once | ORAL | Status: AC
  Administered 2018-04-30: 15 mg via ORAL
  Filled 2018-04-30: qty 3

## 2018-04-30 MED ORDER — BENZTROPINE MESYLATE 1 MG PO TABS: 1.0000 mg | ORAL_TABLET | ORAL | Status: DC

## 2018-04-30 MED ORDER — NICOTINE 21 MG/24HR TD PT24
21.0000 mg | MEDICATED_PATCH | Freq: Every day | TRANSDERMAL | Status: DC
  Administered 2018-04-30: 21 mg via TRANSDERMAL
  Filled 2018-04-30: qty 1

## 2018-04-30 MED ORDER — ARIPIPRAZOLE ER 400 MG IM PRSY: 400.0000 mg | PREFILLED_SYRINGE | INTRAMUSCULAR | Status: DC

## 2018-04-30 MED ORDER — LAMOTRIGINE 25 MG PO TABS
200.0000 mg | ORAL_TABLET | Freq: Two times a day (BID) | ORAL | Status: DC
  Administered 2018-04-30: 200 mg via ORAL
  Filled 2018-04-30: qty 8

## 2018-04-30 MED ORDER — LORAZEPAM 1 MG PO TABS
1.0000 mg | ORAL_TABLET | Freq: Once | ORAL | Status: AC
  Administered 2018-04-30: 1 mg via ORAL
  Filled 2018-04-30: qty 1

## 2018-04-30 NOTE — Discharge Instructions (Addendum)
Take your medications as prescribed.  Please follow-up with your psychiatrist.  Return to the emergency department if you have any thoughts of harming yourself or someone else or if your condition worsens for any reason

## 2018-04-30 NOTE — ED Provider Notes (Addendum)
[facility] EMERGENCY DEPARTMENT Provider Note   CSN: [id number] Arrival date & time: 04/30/18  0815     History   Chief Complaint Chief Complaint  Patient presents with  . Medical Clearance   Level 5 caveat psychiatric complaint HPI [name] is a 43 y.o. male.  Patient found by police wandering on the street in and out of traffic agitated.  Police brought him here he is well-known to them and police are concerned that he is a danger to himself, and he is not at his baseline which is normally pleasant cooperative.  Patient is yelling "I am concerned about my health"  HPI  Past Medical History:  Diagnosis Date  . Bipolar disorder (HCC)   . Hyperlipidemia   . Paranoid schizophrenia (HCC)   . Schizophrenia [facility])     Patient Active Problem List   Diagnosis Date Noted  . Tobacco use disorder   . Schizoaffective disorder, bipolar type (HCC) 06/29/2014    Past Surgical History:  Procedure Laterality Date  . TESTICLE SURGERY          Home Medications    Prior to Admission medications   Medication Sig Start Date End Date Taking? Authorizing Provider  ABILIFY MAINTENA 400 MG PRSY prefilled syringe Inject 400 mg into the muscle every 28 (twenty-eight) days.  03/11/18   [provider]  benztropine (COGENTIN) 1 MG tablet Take 1 tablet (1 mg total) by mouth 2 (two) times daily in the am and at bedtime.. Patient taking differently: Take 1 mg by mouth 2 (two) times daily.  07/21/14   Adonis Brook, NP  lamoTRIgine (LAMICTAL) 100 MG tablet Take 200 mg by mouth 2 (two) times daily.  03/11/18   [provider]  traZODone (DESYREL) 50 MG tablet Take 50 mg by mouth at bedtime.  03/28/18   [provider]    Family History Family History  Problem Relation Age of Onset  . Schizophrenia Neg Hx     Social History Social History   Tobacco Use  . Smoking status: Current Every Day Smoker    Packs/day: 1.00    Types: Cigarettes  . Smokeless  tobacco: Never Used  Substance Use Topics  . Alcohol use: No  . Drug use: No     Allergies   Other   Review of Systems Review of Systems  Unable to perform ROS: Psychiatric disorder  Psychiatric/Behavioral: Positive for agitation.     Physical Exam Updated Vital Signs BP (!) 143/77 (BP Location: Right Arm)   Pulse (!) 128   Temp 98.5 F (36.9 C) (Oral)   Resp 20   SpO2 100%   Physical Exam Vitals signs and nursing note reviewed.  Constitutional:      Appearance: He is well-developed.     Comments: Agitated, yelling.  HENT:     Head: Normocephalic and atraumatic.  Eyes:     Conjunctiva/sclera: Conjunctivae normal.     Pupils: Pupils are equal, round, and reactive to light.  Neck:     Musculoskeletal: Neck supple.     Thyroid: No thyromegaly.     Trachea: No tracheal deviation.  Cardiovascular:     Rate and Rhythm: Regular rhythm.     Heart sounds: No murmur.     Comments: Tachycardic Pulmonary:     Effort: Pulmonary effort is normal.     Breath sounds: Normal breath sounds.  Abdominal:     General: Bowel sounds are normal. There is no distension.  Palpations: Abdomen is soft.     Tenderness: There is no abdominal tenderness.  Musculoskeletal: Normal range of motion.        General: No tenderness.     Comments: 2 cm abrasion on left knee.  No swelling or deformity.  All other extremities without contusion abrasion or tenderness neurovascular intact  Skin:    General: Skin is warm and dry.     Capillary Refill: Capillary refill takes less than 2 seconds.     Findings: No rash.  Neurological:     Mental Status: He is oriented to person, place, and time.     Coordination: Coordination normal.     Comments: Moves all extremities motor strength 5/5 overall.  Oriented to name date and hospital    9:28 AM appears more calm after treatment with oral Ativan  ED Treatments / Results  Labs (all labs ordered are listed, but only abnormal results are  displayed) Labs Reviewed  COMPREHENSIVE METABOLIC PANEL  ETHANOL  RAPID URINE DRUG SCREEN, HOSP PERFORMED  CBC WITH DIFFERENTIAL/PLATELET    EKG EKG Interpretation  Date/Time:  Thursday April 30 2018 09:50:13 EST Ventricular Rate:  114 PR Interval:  128 QRS Duration: 88 QT Interval:  318 QTC Calculation: 438 R Axis:   66 Text Interpretation:  Sinus tachycardia Biatrial enlargement Left ventricular hypertrophy Abnormal ECG No significant change since last tracing Confirmed by Doug SouJacubowitz, Felesha Moncrieffe [phone]([id number]) on 04/30/2018 9:56:49 AM Tracing from earlier today of poor quality  Radiology No results found.  Procedures Procedures (including critical care time)  Medications Ordered in ED Medications  benztropine (COGENTIN) tablet 1 mg (has no administration in time range)  lamoTRIgine (LAMICTAL) tablet 200 mg (200 mg Oral Given 04/30/18 0901)  traZODone (DESYREL) tablet 50 mg (has no administration in time range)  ARIPiprazole (ABILIFY) tablet 15 mg (has no administration in time range)  LORazepam (ATIVAN) tablet 1 mg (1 mg Oral Given 04/30/18 0901)    Results for orders placed or performed during the hospital encounter of 04/30/18  Comprehensive metabolic panel  Result Value Ref Range   Sodium 139 135 - 145 mmol/L   Potassium 3.3 (L) 3.5 - 5.1 mmol/L   Chloride 106 98 - 111 mmol/L   CO2 25 22 - 32 mmol/L   Glucose, Bld 114 (H) 70 - 99 mg/dL   BUN 11 6 - 20 mg/dL   Creatinine, Ser 6.041.12 0.61 - 1.24 mg/dL   Calcium 9.5 8.9 - 54.010.3 mg/dL   Total Protein 7.3 6.5 - 8.1 g/dL   Albumin 4.3 3.5 - 5.0 g/dL   AST 21 15 - 41 U/L   ALT 17 0 - 44 U/L   Alkaline Phosphatase 66 38 - 126 U/L   Total Bilirubin 0.4 0.3 - 1.2 mg/dL   GFR calc non Af Amer >60 >60 mL/min   GFR calc Af Amer >60 >60 mL/min   Anion gap 8 5 - 15  Ethanol  Result Value Ref Range   Alcohol, Ethyl (B) <10 <10 mg/dL  CBC with Diff  Result Value Ref Range   WBC 12.8 (H) 4.0 - 10.5 K/uL   RBC 4.62 4.22 - 5.81 MIL/uL    Hemoglobin 13.8 13.0 - 17.0 g/dL   HCT 98.141.0 19.139.0 - 47.852.0 %   MCV 88.7 80.0 - 100.0 fL   MCH 29.9 26.0 - 34.0 pg   MCHC 33.7 30.0 - 36.0 g/dL   RDW 29.512.1 62.111.5 - 30.815.5 %   Platelets 364 150 - 400 K/uL  nRBC 0.0 0.0 - 0.2 %   Neutrophils Relative % 84 %   Neutro Abs 10.7 (H) 1.7 - 7.7 K/uL   Lymphocytes Relative 10 %   Lymphs Abs 1.3 0.7 - 4.0 K/uL   Monocytes Relative 5 %   Monocytes Absolute 0.6 0.1 - 1.0 K/uL   Eosinophils Relative 1 %   Eosinophils Absolute 0.1 0.0 - 0.5 K/uL   Basophils Relative 0 %   Basophils Absolute 0.0 0.0 - 0.1 K/uL   Immature Granulocytes 0 %   Abs Immature Granulocytes 0.04 0.00 - 0.07 K/uL  Magnesium  Result Value Ref Range   Magnesium 1.9 1.7 - 2.4 mg/dL   No results found. Initial Impression / Assessment and Plan / ED Course  I have reviewed the triage vital signs and the nursing notes.  Pertinent labs & imaging results that were available during my care of the patient were reviewed by me and considered in my medical decision making (see chart for details).     10:35 AM patient is more calm.  Somewhat tearful stating "I am embarrassed."   He is alert ambulatory Glasgow Coma Score 15 Medically cleared for psychiatric evaluation.  Oral potassium supplementation ordered. Labwork consistent with mild hypokalemia and mild leukocytosis.Marland Kitchen  1:15 PM patient is alert ambulates without difficulty, appears calm.  His act member reports that he has been noncompliant with medications.  TTS is evaluate patient.  His ACT team member has come to take him from the hospital.  He is going home to a safe environment.  Patient denies wanting to harm himself or others. Final Clinical Impressions(s) / ED Diagnoses  Diagnosis bipolar disorder-manic #2 hypokalemia Final diagnoses:  None  #3 medication noncompliance  ED Discharge Orders    None       Doug Sou, MD 04/30/18 1040    Doug Sou, MD 04/30/18 1421

## 2018-04-30 NOTE — Progress Notes (Addendum)
Disposition CSW left message for [facility] ACT team coordinator, Quenten Raven, requesting a call back.  [phone]). We are requesting that the pt's ACT team come and pick him up at the ED. Also would like to know if pt has a guardian; Is it safe for him to live alone would a group home be more appropriate?  Also recommend  That patient be seen by the psychiatrist to adjust meds.  Notified [facility] ED CSW, Carolyn Stare, LCSW of status.  Awaiting call back.  Timmothy Euler. Kaylyn Lim, MSW, LCSWA Disposition Clinical Social Work [phone] (cell) [phone] (office)

## 2018-04-30 NOTE — ED Notes (Signed)
ED Provider at bedside. 
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
## 2018-04-30 NOTE — ED Notes (Signed)
Pt will not cooperate with VS or sit still in bed for assessment

## 2018-04-30 NOTE — ED Notes (Signed)
Pt given lunch tray.

## 2018-04-30 NOTE — ED Notes (Signed)
Patient left with [name] from [facility].

## 2018-04-30 NOTE — ED Triage Notes (Signed)
Pt found by [employer] in the road. Pt was rolling in the road, altered and uncooperative.  Pt unable to sit still, throwing himself on the floor and bed.  Pt unable to answer question correctly.

## 2018-04-30 NOTE — ED Notes (Signed)
Pt requested to brush his teeth. Pt given toothbrush & toothpaste.
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
## 2018-04-30 NOTE — ED Notes (Signed)
Requested dietary to bring up finger foods for pt ASAP. Stated they would be up shortly.

## 2018-04-30 NOTE — ED Notes (Addendum)
[name] with ACT team is scheduled to pick pt up according to [place], SW @ [facility]. [name] stated pt has been cleared to return home.

## 2018-04-30 NOTE — ED Notes (Signed)
Patient's belongings in 4 bags plus a backpack secured in psych locker room. Patient did have a wallet in jeans - no money in wallet.  Verified by Bertram Denver and Dyanne Iha. Watch and 3 rings were placed in small bag in with belongings.  All the clothing were wet.

## 2018-04-30 NOTE — Progress Notes (Signed)
Disposition CSW called patient's ACTT provider, [facility] and spoke to [place], Industrial/product designer,  She was able to get in touch with patient's ACTT QP, Kandis Mannan, who is currently in the field seeing clients, but will pick patient up later this afternoon to take him home.  Notified pt's RN, Yvonna Alanis of status.  Timmothy Euler. Kaylyn Lim, MSW, LCSWA Disposition Clinical Social Work [phone] (cell) [phone] (office)

## 2018-04-30 NOTE — ED Notes (Signed)
Patient states he is not feeling SI or HI.

## 2018-05-01 ENCOUNTER — Emergency Department (HOSPITAL_COMMUNITY)
Admission: EM | Admit: 2018-05-01 | Discharge: 2018-05-02 | Disposition: A | Discharge status: Home / Self Care | Payer: Medicare Other | Attending: Emergency Medicine | Admitting: Emergency Medicine

## 2018-05-01 ENCOUNTER — Encounter (HOSPITAL_COMMUNITY): Payer: Self-pay

## 2018-05-01 ENCOUNTER — Other Ambulatory Visit: Payer: Self-pay

## 2018-05-01 DIAGNOSIS — F17210 Nicotine dependence, cigarettes, uncomplicated: Secondary | ICD-10-CM | POA: Insufficient documentation

## 2018-05-01 DIAGNOSIS — F250 Schizoaffective disorder, bipolar type: Secondary | ICD-10-CM | POA: Diagnosis not present

## 2018-05-01 DIAGNOSIS — F259 Schizoaffective disorder, unspecified: Secondary | ICD-10-CM | POA: Diagnosis not present

## 2018-05-01 DIAGNOSIS — F29 Unspecified psychosis not due to a substance or known physiological condition: Secondary | ICD-10-CM | POA: Diagnosis present

## 2018-05-01 DIAGNOSIS — Z79899 Other long term (current) drug therapy: Secondary | ICD-10-CM | POA: Diagnosis not present

## 2018-05-01 HISTORY — DX: Bronchitis, not specified as acute or chronic: J40

## 2018-05-01 LAB — RAPID URINE DRUG SCREEN, HOSP PERFORMED
AMPHETAMINES: NOT DETECTED
BARBITURATES: NOT DETECTED
Benzodiazepines: NOT DETECTED
Cocaine: NOT DETECTED
Opiates: NOT DETECTED
Tetrahydrocannabinol: NOT DETECTED

## 2018-05-01 LAB — CBC
HEMATOCRIT: 41.4 % (ref 39.0–52.0)
Hemoglobin: 13.7 g/dL (ref 13.0–17.0)
MCH: 30.1 pg (ref 26.0–34.0)
MCHC: 33.1 g/dL (ref 30.0–36.0)
MCV: 91 fL (ref 80.0–100.0)
Platelets: 342 10*3/uL (ref 150–400)
RBC: 4.55 MIL/uL (ref 4.22–5.81)
RDW: 12.4 % (ref 11.5–15.5)
WBC: 10.7 10*3/uL — ABNORMAL HIGH (ref 4.0–10.5)
nRBC: 0 % (ref 0.0–0.2)

## 2018-05-01 LAB — COMPREHENSIVE METABOLIC PANEL
ALT: 22 U/L (ref 0–44)
AST: 45 U/L — ABNORMAL HIGH (ref 15–41)
Albumin: 4.5 g/dL (ref 3.5–5.0)
Alkaline Phosphatase: 63 U/L (ref 38–126)
Anion gap: 12 (ref 5–15)
BUN: 10 mg/dL (ref 6–20)
CO2: 24 mmol/L (ref 22–32)
Calcium: 9.3 mg/dL (ref 8.9–10.3)
Chloride: 105 mmol/L (ref 98–111)
Creatinine, Ser: 1.04 mg/dL (ref 0.61–1.24)
GFR calc Af Amer: >60 mL/min (ref >60)
GFR calc non Af Amer: >60 mL/min (ref >60)
Glucose, Bld: 85 mg/dL (ref 70–99)
Potassium: 3.9 mmol/L (ref 3.5–5.1)
Sodium: 141 mmol/L (ref 135–145)
Total Bilirubin: 0.7 mg/dL (ref 0.3–1.2)
Total Protein: 7.3 g/dL (ref 6.5–8.1)

## 2018-05-01 LAB — SALICYLATE LEVEL: Salicylate Lvl: <7 mg/dL (ref 2.8–30.0)

## 2018-05-01 LAB — ETHANOL: Alcohol, Ethyl (B): <10 mg/dL (ref <10)

## 2018-05-01 LAB — ACETAMINOPHEN LEVEL: Acetaminophen (Tylenol), Serum: <10 ug/mL — ABNORMAL LOW (ref 10–30)

## 2018-05-01 MED ORDER — TRAZODONE HCL 50 MG PO TABS
50.0000 mg | ORAL_TABLET | Freq: Every day | ORAL | Status: DC
  Administered 2018-05-01: 50 mg via ORAL
  Filled 2018-05-01: qty 1

## 2018-05-01 MED ORDER — BENZTROPINE MESYLATE 1 MG PO TABS
1.0000 mg | ORAL_TABLET | Freq: Two times a day (BID) | ORAL | Status: DC
  Administered 2018-05-01 – 2018-05-02 (×2): 1 mg via ORAL
  Filled 2018-05-01 (×2): qty 1

## 2018-05-01 MED ORDER — LAMOTRIGINE 100 MG PO TABS
200.0000 mg | ORAL_TABLET | Freq: Two times a day (BID) | ORAL | Status: DC
  Administered 2018-05-01 – 2018-05-02 (×2): 200 mg via ORAL
  Filled 2018-05-01 (×3): qty 2
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
## 2018-05-01 NOTE — ED Notes (Signed)
Bed: WBH34 Expected date:  Expected time:  Means of arrival:  Comments: Hold for triage 4 

## 2018-05-01 NOTE — ED Provider Notes (Signed)
[facility]-EMERGENCY DEPT Provider Note   CSN: [id number] Arrival date & time: 05/01/18  1530     History   Chief Complaint Chief Complaint  Patient presents with  . IVC    HPI [name] is a 43 y.o. male.  This is a 43 year old male with a history of schizophrenia who presents under IVC.  Patient has been noncompliant with his medications.  [facility] and cleared by behavioral health for discharge.  Patient reported has been calling 911 multiple times for no apparent reason.  Patient denies any suicidal or homicidal ideations at this time.  States he is not sure why is here.     Past Medical History:  Diagnosis Date  . Bipolar disorder (HCC)   . Bronchitis   . Hyperlipidemia   . Paranoid schizophrenia (HCC)   . Schizophrenia [facility](HCC)     Patient Active Problem List   Diagnosis Date Noted  . Tobacco use disorder   . Schizoaffective disorder, bipolar type (HCC) 06/29/2014    Past Surgical History:  Procedure Laterality Date  . TESTICLE SURGERY          Home Medications    Prior to Admission medications   Medication Sig Start Date End Date Taking? Authorizing Provider  ABILIFY MAINTENA 400 MG PRSY prefilled syringe Inject 400 mg into the muscle every 28 (twenty-eight) days.  03/11/18   [provider]  benztropine (COGENTIN) 1 MG tablet Take 1 tablet (1 mg total) by mouth 2 (two) times daily in the am and at bedtime.. Patient taking differently: Take 1 mg by mouth 2 (two) times daily.  07/21/14   [name], [name], NP  lamoTRIgine (LAMICTAL) 100 MG tablet Take 200 mg by mouth 2 (two) times daily.  03/11/18   [provider]  traZODone (DESYREL) 50 MG tablet Take 50 mg by mouth at bedtime.  03/28/18   [provider]    Family History Family History  Problem Relation Age of Onset  . Schizophrenia Neg Hx     Social History Social History   Tobacco Use  . Smoking  status: Current Every Day Smoker    Packs/day: 1.00    Types: Cigarettes  . Smokeless tobacco: Never Used  Substance Use Topics  . Alcohol use: No  . Drug use: No     Allergies   Other   Review of Systems Review of Systems  Unable to perform ROS: Psychiatric disorder     Physical Exam Updated Vital Signs BP 137/77 (BP Location: Left Arm)   Pulse 92   Temp 98.5 F (36.9 C) (Oral)   Resp 16   Ht 1.854 m (6\' 1" )   Wt 89.4 kg   SpO2 100%   BMI 25.99 kg/m   Physical Exam Vitals signs and nursing note reviewed.  Constitutional:      General: He is not in acute distress.    Appearance: Normal appearance. He is well-developed. He is not toxic-appearing.  HENT:     Head: Normocephalic and atraumatic.  Eyes:     General: Lids are normal.     Conjunctiva/sclera: Conjunctivae normal.     Pupils: Pupils are equal, round, and reactive to light.  Neck:     Musculoskeletal: Normal range of motion and neck supple.     Thyroid: No thyroid mass.     Trachea: No tracheal deviation.  Cardiovascular:     Rate and Rhythm: Normal rate and regular rhythm.  Heart sounds: Normal heart sounds. No murmur. No gallop.   Pulmonary:     Effort: Pulmonary effort is normal. No respiratory distress.     Breath sounds: Normal breath sounds. No stridor. No decreased breath sounds, wheezing, rhonchi or rales.  Abdominal:     General: Bowel sounds are normal. There is no distension.     Palpations: Abdomen is soft.     Tenderness: There is no abdominal tenderness. There is no rebound.  Musculoskeletal: Normal range of motion.        General: No tenderness.  Skin:    General: Skin is warm and dry.     Findings: No abrasion or rash.  Neurological:     Mental Status: He is alert and oriented to person, place, and time.     GCS: GCS eye subscore is 4. GCS verbal subscore is 5. GCS motor subscore is 6.     Cranial Nerves: No cranial nerve deficit.     Sensory: No sensory deficit.    Psychiatric:        Attention and Perception: Attention normal.        Mood and Affect: Affect is labile.        Speech: Speech is delayed.        Behavior: Behavior is withdrawn.        Thought Content: Thought content does not include homicidal or suicidal ideation. Thought content does not include homicidal or suicidal plan.      ED Treatments / Results  Labs (all labs ordered are listed, but only abnormal results are displayed) Labs Reviewed  CBC - Abnormal; Notable for the following components:      Result Value   WBC 10.7 (*)    All other components within normal limits  COMPREHENSIVE METABOLIC PANEL  ETHANOL  SALICYLATE LEVEL  ACETAMINOPHEN LEVEL  RAPID URINE DRUG SCREEN, HOSP PERFORMED    EKG None  Radiology No results found.  Procedures Procedures (including critical care time)  Medications Ordered in ED Medications  traZODone (DESYREL) tablet 50 mg (has no administration in time range)  lamoTRIgine (LAMICTAL) tablet 200 mg (has no administration in time range)  benztropine (COGENTIN) tablet 1 mg (has no administration in time range)     Initial Impression / Assessment and Plan / ED Course  I have reviewed the triage vital signs and the nursing notes.  Pertinent labs & imaging results that were available during my care of the patient were reviewed by me and considered in my medical decision making (see chart for details).     Patient to be medically clear for psychiatric disposition.  Final Clinical Impressions(s) / ED Diagnoses   Final diagnoses:  None    ED Discharge Orders    None       Lorre NickAllen, Analilia Geddis, MD 05/01/18 [phone]

## 2018-05-01 NOTE — ED Notes (Signed)
Pt oriented to room and unit.  Pt is somewhat uncooperative and withdrawn.   He doesn't want to participate in assessment.  15 minute checks and video monitoring in place.

## 2018-05-01 NOTE — Progress Notes (Signed)
Received [name] at the change of shift in his room awake. He was given a hefty snack. He continued to make requests for food which was denied. He made many trips to the bathroom. He was flushing the toilet, not urinating and walking out of the bathroom. He eventually settled down. Initially he refused his Lamictal, but took it later in the evening.

## 2018-05-01 NOTE — BH Assessment (Signed)
[facility] Assessment Progress Note Case was staffed with Parks FNP who recommended patient be observed and monitored. Patient will be seen by psychiatry in the a.m.    

## 2018-05-01 NOTE — Progress Notes (Addendum)
CSW received a call from Patient's mother [name], [phone]), who was wondering if her son "had been admitted" yet.  CSW asked questions and determined this is a patient who was just seen and released yesterday, 04/30/18, in the [facility] ED.  Since patient was an adult, CSW asked standard questions , "Are you this person's legal guardian? Are you this person's POA?"  Although we have documentation that she is, or was, at one time, the patient's POA, she appeared unaware of this at this time.  CSW explained that unless someone was a legal guardian, or in the case of incapacity, their POA, we could not confirm or deny their presence in any of our medical facilities.  Patient's mother expressed understanding of HIPAA limitations and advised that she was "going to the magistrate to take out an IVC".  CSW then called [facility] ED to see if pt was there, and he was not at that time.  CSW advised that pt may be arriving there.  CSW then found out that pt's mother had apparently brought him directly to [facility]  Patient came in to the lobby began filling out paperwork then left, refusing to give the [facility] Receptionist the paperwork back.    The police then arrived with pt's IVC paperwork shortly afterward and will begin looking for him.  It is not certain if they will bring him back to [facility] or bring him to the [facility] or [facility] ED.  Patient receives ACTT services through [facility] in [place].  CSW called and spoke to [employer], Quenten Raven, to advise that pt's mother was once again IVCing him.  Timmothy Euler. Kaylyn Lim, MSW, LCSWA Disposition Clinical Social Work [phone] (cell) [phone] (office)

## 2018-05-01 NOTE — ED Notes (Signed)
Pt was encouraged to provide urine sample. 

## 2018-05-01 NOTE — ED Triage Notes (Addendum)
Patient was brought in by [employer] x 3 officers. IVC papers report that the patienthas been recently diagnosed with paranoid schizophrenia and multiple psych hospitalizations. Patient has been  Calling 911 repeatedly and the patient was taken to [facility] ED. IVC papers state that th patient was uncooperative with the staff and with [employer]. Patient was brought to [place]. Patient's family reports that he has been aggressive with them.  Patient denies any alcohol or drug use.Patient denies any auditory or visual hallucinations, SI or HI.

## 2018-05-02 ENCOUNTER — Ambulatory Visit (HOSPITAL_COMMUNITY)
Admission: RE | Admit: 2018-05-02 | Discharge: 2018-05-02 | Disposition: A | Discharge status: Home / Self Care | Payer: Medicare Other | Source: Home / Self Care | Attending: Psychiatry | Admitting: Psychiatry

## 2018-05-02 DIAGNOSIS — F250 Schizoaffective disorder, bipolar type: Secondary | ICD-10-CM

## 2018-05-02 DIAGNOSIS — F259 Schizoaffective disorder, unspecified: Secondary | ICD-10-CM | POA: Diagnosis not present
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
## 2018-05-02 NOTE — ED Notes (Signed)
Pt talking on hallway phone.  

## 2018-05-02 NOTE — H&P (Signed)
Behavioral Health Medical Screening Exam  [name] is an 43 y.o. male Presents to [facility] as a walk-in after recent discharge from [facility]  Emergency Department.  Currently denying suicidal or homicidal ideations.  Patient does report intermittent auditory hallucinations and paranoia. Chart reviewed IVC was rescinded.  Support encouragement reassurance was provided.  Total Time spent with patient: 15 minutes  Psychiatric Specialty Exam: Physical Exam  Nursing note and vitals reviewed. Neurological: He is alert.  Psychiatric: He has a normal mood and affect. His behavior is normal.    Review of Systems  Psychiatric/Behavioral: Positive for hallucinations.  All other systems reviewed and are negative.   There were no vitals taken for this visit.There is no height or weight on file to calculate BMI.  General Appearance: Guarded  Eye Contact:  Fair  Speech:  clear, disorganized   Volume:  Decreased  Mood:  Anxious and Depressed  Affect:  Congruent  Thought Process:  Coherent  Orientation:  Full (Time, Place, and Person)  Thought Content:  Hallucinations: Auditory, Paranoid Ideation, Rumination and Tangential  Suicidal Thoughts:  No  Homicidal Thoughts:  No  Memory:  Immediate;   Fair Recent;   Fair Remote;   Fair  Judgement:  Fair  Insight:  Fair  Psychomotor Activity:  Normal  Concentration: Concentration: Fair  Recall:  [employer] of Knowledge:Fair  Language: Fair  Akathisia:  No  Handed:  Right  AIMS (if indicated):     Assets:  Communication Skills Desire for Improvement Resilience Social Support  Sleep:       Musculoskeletal: Strength & Muscle Tone: within normal limits Gait & Station: normal Patient leans: N/A  There were no vitals taken for this visit.  Recommendations: Schizophrenia: - B/P 138/88 HR 89 RR 16 Sat o2 sat  98 Rm .  Based on my evaluation the patient does not appear to have an emergency medical condition.  Oneta Rack,  NP 05/02/2018, 6:59 PM

## 2018-05-02 NOTE — Progress Notes (Signed)
IVC rescinded at 11:11

## 2018-05-02 NOTE — Consult Note (Signed)
[facility] Psych ED Discharge  05/02/2018 11:02 AM [name]  MRN:  [id number] Principal Problem: Schizoaffective disorder, bipolar type [facility]) Discharge Diagnoses: Principal Problem:   Schizoaffective disorder, bipolar type (HCC)   Subjective: Pt was seen and chart reviewed with treatment team and Dr Lucianne Muss. Pt denies suicidal/homicidal ideation, denies auditory/visual hallucinations and does not appear to be responding to internal stimuli. Pt is well known to this ED system and has had 17 visits in 6 months. [name] UDS and BAL are negative. Pt states he lives in an apartment in [place]. [facility] was at [facility] and discharged to his ACT team who picked him up and took him home.  Pt has an ACT team through [facility] in [place], [phone]. Attempted to call and no answer, then called  [phone] [facility] mobile crisis number and spoke with Quenten Raven, informed her that Pt was here and requested ACT team to come and pick him up. This writer was given the number to the ACT team [phone]. Called this number and spoke with [facility] who stated we could either put him in a cab or call his mother to come and get him. Hope stated the [name] mother is his guardian, we have no record of this. (See previous note in chart from Heriberto Antigua, LCSW on 04-30-2018). Hope also stated that they understand this is an ongoing situation with the Pt being discharged and then his mother IVC'ing him and sending him back to the ED because she insists he be admitted. Pt does not meet criteria for inpatient admission.  [name] ACT team stated to call [name] mother to come and get him or put him in a cab home. Pt stated he will cal his mother to come and pick him up. Pt is psychiatrically clear.   Total Time spent with patient: 30 minutes  Past Psychiatric History: As above  Past Medical History:  Past Medical History:  Diagnosis Date  . Bipolar disorder (HCC)   . Bronchitis   . Hyperlipidemia   . Paranoid schizophrenia (HCC)   .  Schizophrenia [facility])     Past Surgical History:  Procedure Laterality Date  . TESTICLE SURGERY     Family History:  Family History  Problem Relation Age of Onset  . Schizophrenia Neg Hx    Family Psychiatric  History: Pt declined to give this information Social History:  Social History   Substance and Sexual Activity  Alcohol Use No     Social History   Substance and Sexual Activity  Drug Use No    Social History   Socioeconomic History  . Marital status: Single    Spouse name: Not on file  . Number of children: Not on file  . Years of education: Not on file  . Highest education level: Not on file  Occupational History  . Occupation: On disability  Social Needs  . Financial resource strain: Not on file  . Food insecurity:    Worry: Not on file    Inability: Not on file  . Transportation needs:    Medical: Not on file    Non-medical: Not on file  Tobacco Use  . Smoking status: Current Every Day Smoker    Packs/day: 1.00    Types: Cigarettes  . Smokeless tobacco: Never Used  Substance and Sexual Activity  . Alcohol use: No  . Drug use: No  . Sexual activity: Yes    Birth control/protection: Condom  Lifestyle  . Physical activity:    Days per week:  Not on file    Minutes per session: Not on file  . Stress: Not on file  Relationships  . Social connections:    Talks on phone: Not on file    Gets together: Not on file    Attends religious service: Not on file    Active member of club or organization: Not on file    Attends meetings of clubs or organizations: Not on file    Relationship status: Not on file  Other Topics Concern  . Not on file  Social History Narrative   Pt lives alone in apartment complex for mentally ill    Has this patient used any form of tobacco in the last 30 days? (Cigarettes, Smokeless Tobacco, Cigars, and/or Pipes) Prescription not provided because: Pt declined  Current Medications: Current Facility-Administered Medications   Medication Dose Route Frequency Provider Last Rate Last Dose  . benztropine (COGENTIN) tablet 1 mg  1 mg Oral BID Lorre NickAllen, Anthony, MD   1 mg at 05/02/18 [id number]  . lamoTRIgine (LAMICTAL) tablet 200 mg  200 mg Oral BID Lorre NickAllen, Anthony, MD   200 mg at 05/02/18 [id number]  . traZODone (DESYREL) tablet 50 mg  50 mg Oral QHS Lorre NickAllen, Anthony, MD   50 mg at 05/01/18 2142   Current Outpatient Medications  Medication Sig Dispense Refill  . ABILIFY MAINTENA 400 MG PRSY prefilled syringe Inject 400 mg into the muscle every 28 (twenty-eight) days.     . benztropine (COGENTIN) 1 MG tablet Take 1 tablet (1 mg total) by mouth 2 (two) times daily in the am and at bedtime.. (Patient taking differently: Take 1 mg by mouth 2 (two) times daily. ) 60 tablet 0  . buPROPion (WELLBUTRIN XL) 150 MG 24 hr tablet Take 150 mg by mouth daily.     . fluPHENAZine (PROLIXIN) 5 MG tablet Take 5 mg by mouth daily.    . hydrOXYzine (ATARAX/VISTARIL) 50 MG tablet Take 50 mg by mouth.    . lamoTRIgine (LAMICTAL) 100 MG tablet Take 200 mg by mouth 2 (two) times daily.     . traZODone (DESYREL) 50 MG tablet Take 50 mg by mouth at bedtime.      PTA Medications: (Not in a hospital admission)   Musculoskeletal: Strength & Muscle Tone: within normal limits Gait & Station: normal Patient leans: N/A  Psychiatric Specialty Exam: Physical Exam  ROS  Blood pressure 127/81, pulse 73, temperature 98.3 F (36.8 C), temperature source Oral, resp. rate 17, height 6\' 1"  (1.854 m), weight 89.4 kg, SpO2 100 %.Body mass index is 25.99 kg/m.  General Appearance: Casual  Eye Contact:  Good  Speech:  Clear and Coherent  Volume:  Normal  Mood:  Anxious and Depressed  Affect:  Congruent and Depressed  Thought Process:  Coherent, Goal Directed and Descriptions of Associations: Intact  Orientation:  Full (Time, Place, and Person)  Thought Content:  Illogical  Suicidal Thoughts:  No  Homicidal Thoughts:  No  Memory:  Immediate;   Good Recent;    Fair Remote;   Fair  Judgement:  Fair  Insight:  Fair  Psychomotor Activity:  Normal  Concentration:  Concentration: Good and Attention Span: Good  Recall:  Good  Fund of Knowledge:  Good  Language:  Good  Akathisia:  No  Handed:  Right  AIMS (if indicated):     Assets:  ArchitectCommunication Skills Financial Resources/Insurance Housing Social Support  ADL's:  Intact  Cognition:  WNL  Sleep:  Demographic Factors:  Male, Low socioeconomic status and Living alone  Loss Factors: Financial problems/change in socioeconomic status  Historical Factors: Family history of mental illness or substance abuse  Risk Reduction Factors:   Sense of responsibility to family  Continued Clinical Symptoms:  Bipolar Disorder:   Depressive phase  Cognitive Features That Contribute To Risk:  Closed-mindedness    Suicide Risk:  Minimal: No identifiable suicidal ideation.  Patients presenting with no risk factors but with morbid ruminations; may be classified as minimal risk based on the severity of the depressive symptoms   Plan Of Care/Follow-up recommendations:  Activity:  as tolerated Diet:  heart healthy  Disposition and Treatment Plan: Schizoaffective disorder, bipolar type (HCC)  Take all medications as prescribed by your outpatient provider.  Keep all follow-up appointments with your ACT team at [facility] as scheduled.  Do not consume alcohol or use illegal drugs while on prescription medications. Report any adverse effects from your medications to your primary care provider promptly.  In the event of recurrent symptoms or worsening symptoms, call 911, a crisis hotline, or go to the nearest emergency department for evaluation.   Laveda Abbe, NP 05/02/2018, 11:02 AM

## 2018-05-02 NOTE — ED Notes (Signed)
Pt d/c home per MD order. Discharge summary reviewed with pt. Pt verbalizes understanding. Denies SI/HI. Personal property returned. Pt signed e-signature. Pt family in lobby for discharge. Ambulatory off unit.

## 2018-05-02 NOTE — Discharge Instructions (Signed)
For your mental health needs, please follow up with:  Your ACT team at [facility] in [place]. Please call [phone] for assistance when needed.  If the ACT team is needed on the weekend or after hours please call  [phone], which is  [facility]'s mobile crisis number. You may also call  [phone] to reach your ACT team members for assistance. This resource should be utilized to assist with any issues you are experiencing related to your mental health needs.

## 2018-05-02 NOTE — ED Notes (Signed)
Pt taking a shower 

## 2018-05-04 ENCOUNTER — Other Ambulatory Visit: Payer: Self-pay

## 2018-05-04 ENCOUNTER — Emergency Department (HOSPITAL_COMMUNITY)
Admission: EM | Admit: 2018-05-04 | Discharge: 2018-05-05 | Disposition: A | Discharge status: Other Acute Inpatient Hospital | Payer: Medicare Other | Attending: Emergency Medicine | Admitting: Emergency Medicine

## 2018-05-04 ENCOUNTER — Encounter (HOSPITAL_COMMUNITY): Payer: Self-pay | Admitting: Emergency Medicine

## 2018-05-04 DIAGNOSIS — F203 Undifferentiated schizophrenia: Secondary | ICD-10-CM | POA: Insufficient documentation

## 2018-05-04 DIAGNOSIS — F29 Unspecified psychosis not due to a substance or known physiological condition: Secondary | ICD-10-CM | POA: Diagnosis present

## 2018-05-04 DIAGNOSIS — F17210 Nicotine dependence, cigarettes, uncomplicated: Secondary | ICD-10-CM | POA: Insufficient documentation

## 2018-05-04 DIAGNOSIS — Z79899 Other long term (current) drug therapy: Secondary | ICD-10-CM | POA: Insufficient documentation

## 2018-05-04 DIAGNOSIS — R410 Disorientation, unspecified: Secondary | ICD-10-CM | POA: Diagnosis not present

## 2018-05-04 LAB — CBC
HCT: 39.7 % (ref 39.0–52.0)
Hemoglobin: 13.1 g/dL (ref 13.0–17.0)
MCH: 29.9 pg (ref 26.0–34.0)
MCHC: 33 g/dL (ref 30.0–36.0)
MCV: 90.6 fL (ref 80.0–100.0)
Platelets: 353 10*3/uL (ref 150–400)
RBC: 4.38 MIL/uL (ref 4.22–5.81)
RDW: 12 % (ref 11.5–15.5)
WBC: 7.2 10*3/uL (ref 4.0–10.5)
nRBC: 0 % (ref 0.0–0.2)

## 2018-05-04 LAB — COMPREHENSIVE METABOLIC PANEL
ALK PHOS: 59 U/L (ref 38–126)
ALT: 21 U/L (ref 0–44)
AST: 21 U/L (ref 15–41)
Albumin: 4.1 g/dL (ref 3.5–5.0)
Anion gap: 8 (ref 5–15)
BILIRUBIN TOTAL: 0.4 mg/dL (ref 0.3–1.2)
BUN: 9 mg/dL (ref 6–20)
CALCIUM: 9.3 mg/dL (ref 8.9–10.3)
CO2: 27 mmol/L (ref 22–32)
Chloride: 105 mmol/L (ref 98–111)
Creatinine, Ser: 0.93 mg/dL (ref 0.61–1.24)
GFR calc Af Amer: >60 mL/min (ref >60)
GFR calc non Af Amer: >60 mL/min (ref >60)
Glucose, Bld: 96 mg/dL (ref 70–99)
Potassium: 3.8 mmol/L (ref 3.5–5.1)
Sodium: 140 mmol/L (ref 135–145)
Total Protein: 6.9 g/dL (ref 6.5–8.1)

## 2018-05-04 LAB — RAPID URINE DRUG SCREEN, HOSP PERFORMED
Amphetamines: NOT DETECTED
Barbiturates: NOT DETECTED
Benzodiazepines: NOT DETECTED
Cocaine: NOT DETECTED
OPIATES: NOT DETECTED
Tetrahydrocannabinol: NOT DETECTED

## 2018-05-04 LAB — ETHANOL: Alcohol, Ethyl (B): <10 mg/dL (ref <10)

## 2018-05-04 LAB — SALICYLATE LEVEL: Salicylate Lvl: <7 mg/dL (ref 2.8–30.0)

## 2018-05-04 LAB — ACETAMINOPHEN LEVEL: Acetaminophen (Tylenol), Serum: <10 ug/mL — ABNORMAL LOW (ref 10–30)

## 2018-05-04 MED ORDER — NICOTINE 21 MG/24HR TD PT24
21.0000 mg | MEDICATED_PATCH | Freq: Once | TRANSDERMAL | Status: DC
  Administered 2018-05-04: 21 mg via TRANSDERMAL
  Filled 2018-05-04: qty 1

## 2018-05-04 NOTE — Progress Notes (Signed)
Pt accepted to [facility], main campus. Dr. Estill Cotta is the accepting/attending provider. Call report to [phone] [facility] @ [facility] notified.    Pt is involuntary and will be transported by law enforcement. Completed IVC paperwork should be faxed to [facility] admissions @ [phone] Pt is scheduled to arrive at [facility] on 05/05/18 at 9am.   Wells Guiles, LCSW, LCAS Disposition CSW [facility]/TTS [phone]

## 2018-05-04 NOTE — ED Notes (Signed)
[name] ACT team supervisor, Maxine Glenn, called stating she can be reached at [phone] for collateral info.  She is concerned about pt as he is not at his baseline.

## 2018-05-04 NOTE — ED Notes (Signed)
Pt wanded in triage  

## 2018-05-04 NOTE — ED Triage Notes (Signed)
Pt mother had him ivc'd. Paperwork reports pt is not taking his medication. Pt is paranoid, having hallucinations and will not stay in his apartment at night.  Pt denies si/hi
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
## 2018-05-04 NOTE — ED Provider Notes (Signed)
[facility] EMERGENCY DEPARTMENT Provider Note   CSN: [id number] Arrival date & time: 05/04/18  1324     History   Chief Complaint Chief Complaint  Patient presents with  . V70.1    HPI James SizerBrian T Harada is a 43 y.o. male.  Patient has a history of paranoid schizophrenia.  He has not been taking his medicines.  His mother states that he has been staying up all night walking the streets.  At one period,  he told the police to shoot him  The history is provided by the patient. No language interpreter was used.  Altered Mental Status  Presenting symptoms: behavior changes and confusion   Severity:  Severe Most recent episode:  More than 2 days ago Episode history:  Continuous Timing:  Constant Progression:  Worsening Chronicity:  Chronic Context: not alcohol use   Associated symptoms: hallucinations   Associated symptoms: no abdominal pain, no headaches, no rash and no seizures     Past Medical History:  Diagnosis Date  . Bipolar disorder (HCC)   . Bronchitis   . Hyperlipidemia   . Paranoid schizophrenia (HCC)   . Schizophrenia [facility](HCC)     Patient Active Problem List   Diagnosis Date Noted  . Tobacco use disorder   . Schizoaffective disorder, bipolar type (HCC) 06/29/2014    Past Surgical History:  Procedure Laterality Date  . TESTICLE SURGERY          Home Medications    Prior to Admission medications   Medication Sig Start Date End Date Taking? Authorizing Provider  ABILIFY MAINTENA 400 MG PRSY prefilled syringe Inject 400 mg into the muscle every 28 (twenty-eight) days.  03/11/18   [provider]  benztropine (COGENTIN) 1 MG tablet Take 1 tablet (1 mg total) by mouth 2 (two) times daily in the am and at bedtime.. Patient taking differently: Take 1 mg by mouth 2 (two) times daily.  07/21/14   [name], [name], NP  buPROPion (WELLBUTRIN XL) 150 MG 24 hr tablet Take 150 mg by mouth daily.     [provider]  fluPHENAZine (PROLIXIN) 5 MG  tablet Take 5 mg by mouth daily.    [provider]  hydrOXYzine (ATARAX/VISTARIL) 50 MG tablet Take 50 mg by mouth.    [provider]  lamoTRIgine (LAMICTAL) 100 MG tablet Take 200 mg by mouth 2 (two) times daily.  03/11/18   [provider]  traZODone (DESYREL) 50 MG tablet Take 50 mg by mouth at bedtime.  03/28/18   [provider]    Family History Family History  Problem Relation Age of Onset  . Schizophrenia Neg Hx     Social History Social History   Tobacco Use  . Smoking status: Current Every Day Smoker    Packs/day: 1.00    Types: Cigarettes  . Smokeless tobacco: Never Used  Substance Use Topics  . Alcohol use: No  . Drug use: No     Allergies   Other   Review of Systems Review of Systems  Constitutional: Negative for appetite change and fatigue.  HENT: Negative for congestion, ear discharge and sinus pressure.   Eyes: Negative for discharge.  Respiratory: Negative for cough.   Cardiovascular: Negative for chest pain.  Gastrointestinal: Negative for abdominal pain and diarrhea.  Genitourinary: Negative for frequency and hematuria.  Musculoskeletal: Negative for back pain.  Skin: Negative for rash.  Neurological: Negative for seizures and headaches.  Psychiatric/Behavioral: Positive for confusion and hallucinations.  Physical Exam Updated Vital Signs BP (!) 130/92 (BP Location: Right Arm)   Pulse 82   Temp 98.2 F (36.8 C) (Oral)   Ht 6\' 1"  (1.854 m)   Wt 89 kg   SpO2 97%   BMI 25.89 kg/m   Physical Exam Vitals signs reviewed.  Constitutional:      Appearance: He is well-developed.  HENT:     Head: Normocephalic.     Nose: Nose normal.  Eyes:     General: No scleral icterus.    Conjunctiva/sclera: Conjunctivae normal.  Neck:     Musculoskeletal: Neck supple.     Thyroid: No thyromegaly.  Cardiovascular:     Rate and Rhythm: Normal rate and regular rhythm.     Heart sounds: No murmur. No friction  rub. No gallop.   Pulmonary:     Breath sounds: No stridor. No wheezing or rales.  Chest:     Chest wall: No tenderness.  Abdominal:     General: There is no distension.     Tenderness: There is no abdominal tenderness. There is no rebound.  Musculoskeletal: Normal range of motion.  Lymphadenopathy:     Cervical: No cervical adenopathy.  Skin:    Findings: No erythema or rash.  Neurological:     Mental Status: He is oriented to person, place, and time.     Motor: No abnormal muscle tone.     Coordination: Coordination normal.  Psychiatric:     Comments: Patient having auditory hallucinations.      ED Treatments / Results  Labs (all labs ordered are listed, but only abnormal results are displayed) Labs Reviewed  ACETAMINOPHEN LEVEL - Abnormal; Notable for the following components:      Result Value   Acetaminophen (Tylenol), Serum <10 (*)    All other components within normal limits  COMPREHENSIVE METABOLIC PANEL  ETHANOL  SALICYLATE LEVEL  CBC  RAPID URINE DRUG SCREEN, HOSP PERFORMED    EKG None  Radiology No results found.  Procedures Procedures (including critical care time)  Medications Ordered in ED Medications - No data to display   Initial Impression / Assessment and Plan / ED Course  I have reviewed the triage vital signs and the nursing notes.  Pertinent labs & imaging results that were available during my care of the patient were reviewed by me and considered in my medical decision making (see chart for details).     Patient has been committed by mother for not taking his medicines and his paranoid schizophrenia seems to be getting worse.  Patient will be evaluated by mental health  Final Clinical Impressions(s) / ED Diagnoses   Final diagnoses:  None    ED Discharge Orders    None       [name], Braelin Brosch, MD 05/04/18 1520

## 2018-05-04 NOTE — Progress Notes (Signed)
Pt meets inpatient criteria per Hillery Jacks, NP. Referral information has been sent to the following hospitals for review: [facility]  [facility]  [facility]  [facility]  [facility]  [facility]   Disposition will continue to assist with inpatient placement needs.   Wells Guiles, LCSW, LCAS Disposition CSW [facility]/TTS [phone]

## 2018-05-04 NOTE — ED Notes (Signed)
Pt wanded by security. 

## 2018-05-05 DIAGNOSIS — F203 Undifferentiated schizophrenia: Secondary | ICD-10-CM | POA: Diagnosis not present

## 2018-05-05 MED ORDER — IBUPROFEN 400 MG PO TABS
400.0000 mg | ORAL_TABLET | Freq: Once | ORAL | Status: AC
  Administered 2018-05-05: 400 mg via ORAL
  Filled 2018-05-05: qty 1

## 2018-05-05 NOTE — ED Notes (Signed)
Called c-com at this time for pt transport to [facility] .[facility]

## 2018-05-05 NOTE — ED Provider Notes (Addendum)
7:25 AM patient alert ambulatory, talkative, appears in no distress Results for orders placed or performed during the hospital encounter of 05/04/18  Comprehensive metabolic panel  Result Value Ref Range   Sodium 140 135 - 145 mmol/L   Potassium 3.8 3.5 - 5.1 mmol/L   Chloride 105 98 - 111 mmol/L   CO2 27 22 - 32 mmol/L   Glucose, Bld 96 70 - 99 mg/dL   BUN 9 6 - 20 mg/dL   Creatinine, Ser 2.95 0.61 - 1.24 mg/dL   Calcium 9.3 8.9 - 18.8 mg/dL   Total Protein 6.9 6.5 - 8.1 g/dL   Albumin 4.1 3.5 - 5.0 g/dL   AST 21 15 - 41 U/L   ALT 21 0 - 44 U/L   Alkaline Phosphatase 59 38 - 126 U/L   Total Bilirubin 0.4 0.3 - 1.2 mg/dL   GFR calc non Af Amer >60 >60 mL/min   GFR calc Af Amer >60 >60 mL/min   Anion gap 8 5 - 15  Ethanol  Result Value Ref Range   Alcohol, Ethyl (B) <10 <10 mg/dL  Salicylate level  Result Value Ref Range   Salicylate Lvl <7.0 2.8 - 30.0 mg/dL  Acetaminophen level  Result Value Ref Range   Acetaminophen (Tylenol), Serum <10 (L) 10 - 30 ug/mL  cbc  Result Value Ref Range   WBC 7.2 4.0 - 10.5 K/uL   RBC 4.38 4.22 - 5.81 MIL/uL   Hemoglobin 13.1 13.0 - 17.0 g/dL   HCT 41.6 60.6 - 30.1 %   MCV 90.6 80.0 - 100.0 fL   MCH 29.9 26.0 - 34.0 pg   MCHC 33.0 30.0 - 36.0 g/dL   RDW 60.1 09.3 - 23.5 %   Platelets 353 150 - 400 K/uL   nRBC 0.0 0.0 - 0.2 %  Rapid urine drug screen (hospital performed)  Result Value Ref Range   Opiates NONE DETECTED NONE DETECTED   Cocaine NONE DETECTED NONE DETECTED   Benzodiazepines NONE DETECTED NONE DETECTED   Amphetamines NONE DETECTED NONE DETECTED   Tetrahydrocannabinol NONE DETECTED NONE DETECTED   Barbiturates NONE DETECTED NONE DETECTED   No results found.   Doug Sou, MD 05/05/18 [phone] 8 AM requesting ibuprofen for headache.  Ibuprofen 400 mg p.o. ordered by me.  Patient remains alert cooperative Glasgow Coma Score 15.  He is being transferred to [facility].  Accepting provider Dr. Loyola Mast. Stable for  transfer   Doug Sou, MD 05/05/18 [phone]

## 2018-05-25 ENCOUNTER — Encounter (HOSPITAL_COMMUNITY): Payer: Self-pay | Admitting: Emergency Medicine

## 2018-05-25 ENCOUNTER — Other Ambulatory Visit: Payer: Self-pay

## 2018-05-25 DIAGNOSIS — E785 Hyperlipidemia, unspecified: Secondary | ICD-10-CM | POA: Insufficient documentation

## 2018-05-25 DIAGNOSIS — R440 Auditory hallucinations: Secondary | ICD-10-CM | POA: Diagnosis not present

## 2018-05-25 DIAGNOSIS — F319 Bipolar disorder, unspecified: Secondary | ICD-10-CM | POA: Insufficient documentation

## 2018-05-25 DIAGNOSIS — F209 Schizophrenia, unspecified: Secondary | ICD-10-CM | POA: Diagnosis not present

## 2018-05-25 DIAGNOSIS — Z79899 Other long term (current) drug therapy: Secondary | ICD-10-CM | POA: Diagnosis not present

## 2018-05-25 NOTE — ED Triage Notes (Signed)
Pt reports he is paranoid, pt originally reports hearing voices but then denies, pt reports he has drank too much caffeine today and has taken his night meds, pt denies SI/HI at this time

## 2018-05-26 ENCOUNTER — Emergency Department (HOSPITAL_COMMUNITY)
Admission: EM | Admit: 2018-05-26 | Discharge: 2018-05-26 | Disposition: A | Discharge status: Home / Self Care | Payer: Medicare Other | Attending: Emergency Medicine | Admitting: Emergency Medicine

## 2018-05-26 DIAGNOSIS — R440 Auditory hallucinations: Secondary | ICD-10-CM

## 2018-05-26 NOTE — ED Notes (Signed)
Pt states "I took all of my night time medications. Now I have forgotten what all I wanted to tell you all."

## 2018-05-26 NOTE — Discharge Instructions (Addendum)
Take your medications.  Go to [facility] today to have them evaluate you.  Take your medications as prescribed.

## 2018-05-26 NOTE — ED Notes (Signed)
Pt comfortably sitting on stretcher. Asked this nurse "what kind of drinks do you have here?" This nurse stated he could not have anything to drink until he has been seen by the physician.

## 2018-05-26 NOTE — ED Provider Notes (Signed)
[facility] EMERGENCY DEPARTMENT Provider Note   CSN: [id number] Arrival date & time: 05/25/18  2057  Time seen 2:47 AM   History   Chief Complaint Chief Complaint  Patient presents with  . Paranoid    HPI [name] is a 43 y.o. male.  HPI patient has a history of paranoid schizophrenia and has frequent ED visits for auditory hallucinations.  He was just admitted to [facility] on January 7 and states he was there about 11 days.  So that appears he was discharged about 10 days ago.  He states he is taking his medications.  He states he is hearing voices and that "sometimes they tell the truth and sometimes they live a lot".  He denies any suicidal or homicidal ideation.  He states a month ago he had some tele-kinesis where a plate moved on his command.  Patient has taken his evening medications so he is very sleepy.  He states "I cannot remember everything I was wanting to tell you about".  PCP Mirna MiresHill, Gerald, MD   Past Medical History:  Diagnosis Date  . Bipolar disorder (HCC)   . Bronchitis   . Hyperlipidemia   . Paranoid schizophrenia (HCC)   . Schizophrenia [facility](HCC)     Patient Active Problem List   Diagnosis Date Noted  . Tobacco use disorder   . Schizoaffective disorder, bipolar type (HCC) 06/29/2014    Past Surgical History:  Procedure Laterality Date  . TESTICLE SURGERY          Home Medications    Prior to Admission medications   Medication Sig Start Date End Date Taking? Authorizing Provider  ABILIFY MAINTENA 400 MG PRSY prefilled syringe Inject 400 mg into the muscle every 28 (twenty-eight) days.  03/11/18   [provider]  benztropine (COGENTIN) 1 MG tablet Take 1 tablet (1 mg total) by mouth 2 (two) times daily in the am and at bedtime.. Patient taking differently: Take 1 mg by mouth 2 (two) times daily.  07/21/14   [name], [name], NP  buPROPion (WELLBUTRIN XL) 150 MG 24 hr tablet Take 150 mg by mouth daily.     [provider]    fluPHENAZine (PROLIXIN) 5 MG tablet Take 5 mg by mouth daily.    [provider]  hydrOXYzine (ATARAX/VISTARIL) 50 MG tablet Take 50 mg by mouth.    [provider]  lamoTRIgine (LAMICTAL) 100 MG tablet Take 200 mg by mouth 2 (two) times daily.  03/11/18   [provider]  traZODone (DESYREL) 50 MG tablet Take 50 mg by mouth at bedtime.  03/28/18   [provider]    Family History Family History  Problem Relation Age of Onset  . Schizophrenia Neg Hx     Social History Social History   Tobacco Use  . Smoking status: Current Every Day Smoker    Packs/day: 1.00    Types: Cigarettes  . Smokeless tobacco: Never Used  Substance Use Topics  . Alcohol use: No  . Drug use: No    Comment: patient states that he used to drink and smoke marijuana 6-7 yrs ago  States he lives and an apartment   Allergies   Other   Review of Systems Review of Systems  All other systems reviewed and are negative.    Physical Exam Updated Vital Signs BP 121/81   Pulse 91   Temp 98.9 F (37.2 C) (Temporal)   Resp 17   Ht 6' (1.829 m)  Wt 102.5 kg   SpO2 97%   BMI 30.65 kg/m   Physical Exam Vitals signs and nursing note reviewed.  Constitutional:      General: He is not in acute distress.    Appearance: Normal appearance. He is well-developed. He is not ill-appearing or toxic-appearing.  HENT:     Head: Normocephalic and atraumatic.     Right Ear: External ear normal.     Left Ear: External ear normal.     Nose: Nose normal. No mucosal edema or rhinorrhea.     Mouth/Throat:     Mouth: Mucous membranes are moist.     Dentition: No dental abscesses.     Pharynx: No uvula swelling.  Eyes:     Conjunctiva/sclera: Conjunctivae normal.     Pupils: Pupils are equal, round, and reactive to light.  Neck:     Musculoskeletal: Full passive range of motion without pain, normal range of motion and neck supple.  Cardiovascular:     Rate and Rhythm: Normal  rate and regular rhythm.     Heart sounds: Normal heart sounds. No murmur. No friction rub. No gallop.   Pulmonary:     Effort: Pulmonary effort is normal. No respiratory distress.     Breath sounds: Normal breath sounds. No wheezing, rhonchi or rales.  Chest:     Chest wall: No tenderness or crepitus.  Musculoskeletal: Normal range of motion.        General: No deformity.     Comments: Moves all extremities well.   Skin:    General: Skin is warm and dry.     Capillary Refill: Capillary refill takes less than 2 seconds.     Coloration: Skin is not pale.     Findings: No erythema or rash.  Neurological:     General: No focal deficit present.     Mental Status: He is alert and oriented to person, place, and time.     Cranial Nerves: No cranial nerve deficit.  Psychiatric:        Attention and Perception: He perceives auditory hallucinations.        Mood and Affect: Mood normal. Mood is not anxious.        Speech: Speech normal.        Behavior: Behavior normal.        Thought Content: Thought content normal. Thought content does not include homicidal or suicidal ideation. Thought content does not include homicidal or suicidal plan.     Comments: Patient does not appear to be responding to any internal stimuli      ED Treatments / Results  Labs (all labs ordered are listed, but only abnormal results are displayed) Labs Reviewed - No data to display  EKG None  Radiology No results found.  Procedures Procedures (including critical care time)  Medications Ordered in ED Medications - No data to display   Initial Impression / Assessment and Plan / ED Course  I have reviewed the triage vital signs and the nursing notes.  Pertinent labs & imaging results that were available during my care of the patient were reviewed by me and considered in my medical decision making (see chart for details).     At this point patient does not appear to need to be hospitalized.  He was  discharged home to [facility].  Final Clinical Impressions(s) / ED Diagnoses   Final diagnoses:  Auditory hallucinations    ED Discharge Orders    None  Plan discharge  Devoria Albe, MD, Concha Pyo, MD 05/26/18 [phone]

## 2018-06-07 ENCOUNTER — Emergency Department (HOSPITAL_COMMUNITY)
Admission: EM | Admit: 2018-06-07 | Discharge: 2018-06-08 | Disposition: A | Discharge status: Home / Self Care | Payer: Medicare Other | Attending: Emergency Medicine | Admitting: Emergency Medicine

## 2018-06-07 ENCOUNTER — Encounter (HOSPITAL_COMMUNITY): Payer: Self-pay | Admitting: Emergency Medicine

## 2018-06-07 ENCOUNTER — Other Ambulatory Visit: Payer: Self-pay

## 2018-06-07 DIAGNOSIS — F209 Schizophrenia, unspecified: Secondary | ICD-10-CM | POA: Insufficient documentation

## 2018-06-07 DIAGNOSIS — F17210 Nicotine dependence, cigarettes, uncomplicated: Secondary | ICD-10-CM | POA: Diagnosis not present

## 2018-06-07 DIAGNOSIS — Z79899 Other long term (current) drug therapy: Secondary | ICD-10-CM | POA: Diagnosis not present

## 2018-06-07 DIAGNOSIS — F22 Delusional disorders: Secondary | ICD-10-CM | POA: Diagnosis present

## 2018-06-07 DIAGNOSIS — F418 Other specified anxiety disorders: Secondary | ICD-10-CM | POA: Insufficient documentation

## 2018-06-07 DIAGNOSIS — F319 Bipolar disorder, unspecified: Secondary | ICD-10-CM | POA: Insufficient documentation

## 2018-06-07 NOTE — ED Triage Notes (Signed)
Pt states he feels sick and need to be "checked out". States he hasn't had a flu shot and needs that as well as bloodwork. Denies N/V/D or cough.

## 2018-06-08 NOTE — ED Provider Notes (Signed)
[facility] EMERGENCY DEPARTMENT Provider Note   CSN: [id number] Arrival date & time: 06/07/18  2322  Time off 12:05 AM (not in room 23:55)   History   Chief Complaint Chief Complaint  Patient presents with  . Follow-up    HPI [name] is a 43 y.o. male.  HPI patient is a well-known ED visitor usually for paranoid thoughts.  Patient shows me a healed scarred area on his anterior right shin which he states was a insect bite and he was seen at [facility] for about 3 weeks ago.  He was reassured that is healing well.  He states he has had a mild cough and some mild rhinorrhea and sneezing.  He states he was hearing voices but not anymore.  He states his only concern tonight is that he wants to be tested for HIV because "I want to know".  He denies any specific exposure or health concerns that would make him feel like he is HIV positive.  He also states he wants the flu shot.  Patient states he has an appointment with his psychiatrist on the 13th.  He states he is taking his medication.  He states he is staying in his apartment and he is not afraid to go there which is usually a common complaint of his.  Patient is easier to talk to tonight and is able to hold a regular conversation easier than he has had in the past.  PCP Mirna Mires, MD   Past Medical History:  Diagnosis Date  . Bipolar disorder (HCC)   . Bronchitis   . Hyperlipidemia   . Paranoid schizophrenia (HCC)   . Schizophrenia [facility])     Patient Active Problem List   Diagnosis Date Noted  . Tobacco use disorder   . Schizoaffective disorder, bipolar type (HCC) 06/29/2014    Past Surgical History:  Procedure Laterality Date  . TESTICLE SURGERY          Home Medications    Prior to Admission medications   Medication Sig Start Date End Date Taking? Authorizing Provider  ABILIFY MAINTENA 400 MG PRSY prefilled syringe Inject 400 mg into the muscle every 28 (twenty-eight) days.  03/11/18   [provider]  benztropine (COGENTIN) 1 MG tablet Take 1 tablet (1 mg total) by mouth 2 (two) times daily in the am and at bedtime.. Patient taking differently: Take 1 mg by mouth 2 (two) times daily.  07/21/14   Adonis Brook, NP  buPROPion (WELLBUTRIN XL) 150 MG 24 hr tablet Take 150 mg by mouth daily.     [provider]  fluPHENAZine (PROLIXIN) 5 MG tablet Take 5 mg by mouth daily.    [provider]  hydrOXYzine (ATARAX/VISTARIL) 50 MG tablet Take 50 mg by mouth.    [provider]  lamoTRIgine (LAMICTAL) 100 MG tablet Take 200 mg by mouth 2 (two) times daily.  03/11/18   [provider]  traZODone (DESYREL) 50 MG tablet Take 50 mg by mouth at bedtime.  03/28/18   [provider]    Family History Family History  Problem Relation Age of Onset  . Schizophrenia Neg Hx     Social History Social History   Tobacco Use  . Smoking status: Current Every Day Smoker    Packs/day: 1.00    Types: Cigarettes  . Smokeless tobacco: Never Used  Substance Use Topics  . Alcohol use: No  . Drug use: No    Comment: patient states that  he used to drink and smoke marijuana 6-7 yrs ago  lives alone   Allergies   Other   Review of Systems Review of Systems  All other systems reviewed and are negative.    Physical Exam Updated Vital Signs BP (!) 147/84   Pulse 88   Temp 98.8 F (37.1 C) (Oral)   Resp 20   Ht 6' (1.829 m) Comment: Simultaneous filing. User may not have seen previous data.  Wt 89.8 kg Comment: Simultaneous filing. User may not have seen previous data.  SpO2 99%   BMI 26.85 kg/m   Vital signs normal    Physical Exam Vitals signs and nursing note reviewed.  Constitutional:      General: He is not in acute distress.    Appearance: Normal appearance.  HENT:     Head: Normocephalic and atraumatic.     Right Ear: External ear normal.     Left Ear: External ear normal.     Nose: Nose normal.     Mouth/Throat:     Mouth: Mucous  membranes are moist.  Eyes:     Extraocular Movements: Extraocular movements intact.     Conjunctiva/sclera: Conjunctivae normal.  Neck:     Musculoskeletal: Normal range of motion.  Cardiovascular:     Rate and Rhythm: Normal rate.  Pulmonary:     Effort: Pulmonary effort is normal. No respiratory distress.  Musculoskeletal: Normal range of motion.  Skin:    General: Skin is warm and dry.     Capillary Refill: Capillary refill takes less than 2 seconds.  Neurological:     General: No focal deficit present.     Mental Status: He is alert and oriented to person, place, and time.     Cranial Nerves: No cranial nerve deficit.  Psychiatric:        Mood and Affect: Mood normal.        Behavior: Behavior normal.        Thought Content: Thought content normal.      ED Treatments / Results  Labs (all labs ordered are listed, but only abnormal results are displayed) Labs Reviewed - No data to display  EKG None  Radiology No results found.  Procedures Procedures (including critical care time)  Medications Ordered in ED Medications - No data to display   Initial Impression / Assessment and Plan / ED Course  I have reviewed the triage vital signs and the nursing notes.  Pertinent labs & imaging results that were available during my care of the patient were reviewed by me and considered in my medical decision making (see chart for details).     Unfortunately we are not doing flu shots from the ED to the general public.  Patient was advised to go to the health department to get his flu shot and also to be tested for HIV.  There is no specific medical complaint tonight that would indicate he needs a test done in the ED tonight.  Final Clinical Impressions(s) / ED Diagnoses   Final diagnoses:  Anxiety about health    ED Discharge Orders    None      Plan discharge  Devoria AlbeIva Shamon Lobo, MD, Concha PyoFACEP    Rayssa Atha, MD 06/08/18 [id number]

## 2018-06-08 NOTE — Discharge Instructions (Signed)
Please go to the health department to get your flu shot and to do your HIV testing. Keep your appointment with Dr Rosalia Hammers at [facility] this week.

## 2018-06-12 ENCOUNTER — Encounter (HOSPITAL_COMMUNITY): Payer: Self-pay | Admitting: *Deleted

## 2018-06-12 ENCOUNTER — Emergency Department (HOSPITAL_COMMUNITY)
Admission: EM | Admit: 2018-06-12 | Discharge: 2018-06-12 | Disposition: A | Discharge status: Home / Self Care | Payer: Medicare Other | Attending: Emergency Medicine | Admitting: Emergency Medicine

## 2018-06-12 ENCOUNTER — Other Ambulatory Visit: Payer: Self-pay

## 2018-06-12 DIAGNOSIS — R4182 Altered mental status, unspecified: Secondary | ICD-10-CM | POA: Diagnosis present

## 2018-06-12 DIAGNOSIS — Z79899 Other long term (current) drug therapy: Secondary | ICD-10-CM | POA: Insufficient documentation

## 2018-06-12 DIAGNOSIS — F22 Delusional disorders: Secondary | ICD-10-CM

## 2018-06-12 DIAGNOSIS — F200 Paranoid schizophrenia: Secondary | ICD-10-CM | POA: Insufficient documentation

## 2018-06-12 DIAGNOSIS — F17210 Nicotine dependence, cigarettes, uncomplicated: Secondary | ICD-10-CM | POA: Diagnosis not present

## 2018-06-12 MED ORDER — TRAZODONE HCL 50 MG PO TABS: 50.0000 mg | ORAL_TABLET | Freq: Every day | ORAL | 0 refills | Status: DC

## 2018-06-12 MED ORDER — TRAZODONE HCL 50 MG PO TABS
50.0000 mg | ORAL_TABLET | Freq: Every day | ORAL | Status: DC
  Administered 2018-06-12: 50 mg via ORAL
  Filled 2018-06-12: qty 1

## 2018-06-12 NOTE — ED Triage Notes (Signed)
Also states he is out of trazadone

## 2018-06-12 NOTE — ED Provider Notes (Signed)
[facility] EMERGENCY DEPARTMENT Provider Note   CSN: [id number] Arrival date & time: 06/12/18  2016     History   Chief Complaint Chief Complaint  Patient presents with  . Medical Clearance    HPI [name] is a 43 y.o. male.  Patient has a history of paranoid schizophrenia.  Patient states that he has lost his trazodone.  He also states he feels little uncomfortable in his apartment but he not suicidal or homicidal and is not having auditory hallucinations  The history is provided by the patient. No language interpreter was used.  Altered Mental Status  Presenting symptoms: behavior changes   Severity:  Mild Most recent episode:  Today Episode history:  Single Timing:  Constant Progression:  Waxing and waning Chronicity:  Recurrent Context: not alcohol use   Associated symptoms: agitation   Associated symptoms: no abdominal pain, no hallucinations, no headaches, no rash and no seizures     Past Medical History:  Diagnosis Date  . Bipolar disorder (HCC)   . Bronchitis   . Hyperlipidemia   . Paranoid schizophrenia (HCC)   . Schizophrenia [facility])     Patient Active Problem List   Diagnosis Date Noted  . Tobacco use disorder   . Schizoaffective disorder, bipolar type (HCC) 06/29/2014    Past Surgical History:  Procedure Laterality Date  . TESTICLE SURGERY          Home Medications    Prior to Admission medications   Medication Sig Start Date End Date Taking? Authorizing Provider  ABILIFY MAINTENA 400 MG PRSY prefilled syringe Inject 400 mg into the muscle every 28 (twenty-eight) days.  03/11/18   [provider]  benztropine (COGENTIN) 1 MG tablet Take 1 tablet (1 mg total) by mouth 2 (two) times daily in the am and at bedtime.. Patient taking differently: Take 1 mg by mouth 2 (two) times daily.  07/21/14   Adonis Brook, NP  buPROPion (WELLBUTRIN XL) 150 MG 24 hr tablet Take 150 mg by mouth daily.     [provider]  fluPHENAZine  (PROLIXIN) 5 MG tablet Take 5 mg by mouth daily.    [provider]  hydrOXYzine (ATARAX/VISTARIL) 50 MG tablet Take 50 mg by mouth.    [provider]  lamoTRIgine (LAMICTAL) 100 MG tablet Take 200 mg by mouth 2 (two) times daily.  03/11/18   [provider]  traZODone (DESYREL) 50 MG tablet Take 1 tablet (50 mg total) by mouth at bedtime. 06/12/18   Bethann Berkshire, MD    Family History Family History  Problem Relation Age of Onset  . Schizophrenia Neg Hx     Social History Social History   Tobacco Use  . Smoking status: Current Every Day Smoker    Packs/day: 1.00    Types: Cigarettes  . Smokeless tobacco: Never Used  Substance Use Topics  . Alcohol use: No  . Drug use: No    Comment: patient states that he used to drink and smoke marijuana 6-7 yrs ago     Allergies   Other   Review of Systems Review of Systems  Constitutional: Negative for appetite change and fatigue.  HENT: Negative for congestion, ear discharge and sinus pressure.   Eyes: Negative for discharge.  Respiratory: Negative for cough.   Cardiovascular: Negative for chest pain.  Gastrointestinal: Negative for abdominal pain and diarrhea.  Genitourinary: Negative for frequency and hematuria.  Musculoskeletal: Negative for back pain.  Skin: Negative for rash.  Neurological: Negative  for seizures and headaches.  Psychiatric/Behavioral: Positive for agitation. Negative for hallucinations.     Physical Exam Updated Vital Signs BP (!) 169/96 (BP Location: Right Arm)   Pulse 95   Temp 97.9 F (36.6 C) (Temporal)   Resp 18   Ht 6' (1.829 m)   Wt 89.8 kg   SpO2 96%   BMI 26.85 kg/m   Physical Exam Vitals signs and nursing note reviewed.  Constitutional:      Appearance: He is well-developed.  HENT:     Head: Normocephalic.  Eyes:     General: No scleral icterus.    Conjunctiva/sclera: Conjunctivae normal.  Neck:     Musculoskeletal: Neck supple.     Thyroid: No  thyromegaly.  Cardiovascular:     Rate and Rhythm: Normal rate and regular rhythm.     Heart sounds: No murmur. No friction rub. No gallop.   Pulmonary:     Breath sounds: No stridor. No wheezing or rales.  Chest:     Chest wall: No tenderness.  Abdominal:     General: There is no distension.     Tenderness: There is no abdominal tenderness. There is no rebound.  Musculoskeletal: Normal range of motion.  Lymphadenopathy:     Cervical: No cervical adenopathy.  Skin:    Findings: No erythema or rash.  Neurological:     Mental Status: He is oriented to person, place, and time.     Motor: No abnormal muscle tone.     Coordination: Coordination normal.  Psychiatric:     Comments: Mild paranoia but not suicidal not homicidal and not having hallucinations      ED Treatments / Results  Labs (all labs ordered are listed, but only abnormal results are displayed) Labs Reviewed - No data to display  EKG None  Radiology No results found.  Procedures Procedures (including critical care time)  Medications Ordered in ED Medications  traZODone (DESYREL) tablet 50 mg (has no administration in time range)     Initial Impression / Assessment and Plan / ED Course  I have reviewed the triage vital signs and the nursing notes.  Pertinent labs & imaging results that were available during my care of the patient were reviewed by me and considered in my medical decision making (see chart for details).    With mild paranoia.  He is given his trazodone tonight and given another prescription for more trazodone.  He is will follow-up with [facility] tomorrow morning  Final Clinical Impressions(s) / ED Diagnoses   Final diagnoses:  Paranoid behavior [facility])    ED Discharge Orders         Ordered    traZODone (DESYREL) 50 MG tablet  Daily at bedtime     06/12/18 2129           Bethann Berkshire, MD 06/12/18 2133

## 2018-06-12 NOTE — ED Triage Notes (Signed)
States he is not suicidal, states he lives a;lone and is not comfortable in his current living situation

## 2018-06-12 NOTE — Discharge Instructions (Addendum)
Follow-up by [facility] tomorrow for recheck
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
## 2018-06-27 ENCOUNTER — Emergency Department (HOSPITAL_COMMUNITY)
Admission: EM | Admit: 2018-06-27 | Discharge: 2018-06-27 | Disposition: A | Discharge status: Home / Self Care | Payer: Medicare Other

## 2018-06-27 NOTE — ED Notes (Signed)
Unable to locate patient in waiting areas 

## 2018-08-04 DIAGNOSIS — E785 Hyperlipidemia, unspecified: Secondary | ICD-10-CM | POA: Diagnosis not present

## 2018-08-04 DIAGNOSIS — Z720 Tobacco use: Secondary | ICD-10-CM | POA: Diagnosis not present

## 2018-08-04 DIAGNOSIS — J301 Allergic rhinitis due to pollen: Secondary | ICD-10-CM | POA: Diagnosis not present

## 2018-10-13 DIAGNOSIS — L299 Pruritus, unspecified: Secondary | ICD-10-CM | POA: Diagnosis not present

## 2018-10-13 DIAGNOSIS — L2084 Intrinsic (allergic) eczema: Secondary | ICD-10-CM | POA: Diagnosis not present

## 2018-11-02 ENCOUNTER — Other Ambulatory Visit: Payer: Self-pay

## 2018-11-02 ENCOUNTER — Emergency Department (HOSPITAL_COMMUNITY)
Admission: EM | Admit: 2018-11-02 | Discharge: 2018-11-02 | Disposition: A | Discharge status: Home / Self Care | Payer: Medicare Other | Attending: Emergency Medicine | Admitting: Emergency Medicine

## 2018-11-02 ENCOUNTER — Encounter (HOSPITAL_COMMUNITY): Payer: Self-pay | Admitting: Emergency Medicine

## 2018-11-02 DIAGNOSIS — F17210 Nicotine dependence, cigarettes, uncomplicated: Secondary | ICD-10-CM | POA: Diagnosis not present

## 2018-11-02 DIAGNOSIS — L299 Pruritus, unspecified: Secondary | ICD-10-CM | POA: Insufficient documentation

## 2018-11-02 DIAGNOSIS — Z79899 Other long term (current) drug therapy: Secondary | ICD-10-CM | POA: Insufficient documentation

## 2018-11-02 DIAGNOSIS — E785 Hyperlipidemia, unspecified: Secondary | ICD-10-CM | POA: Diagnosis not present

## 2018-11-02 DIAGNOSIS — L309 Dermatitis, unspecified: Secondary | ICD-10-CM | POA: Insufficient documentation

## 2018-11-02 NOTE — Discharge Instructions (Addendum)
Avoid taking hot showers, they will make you itch more. You can take benadryl 25-50 mg every 6 hrs as needed for itching. Use your cream the dermatologist ordered for you on the itchy areas. If you have a spot that is really itching badly, you can put an ice pack on it for relief.  Recheck if you think you are getting an infection in your skin.

## 2018-11-02 NOTE — ED Provider Notes (Signed)
[facility] EMERGENCY DEPARTMENT Provider Note   CSN: [id number] Arrival date & time: 11/02/18  0358  Time seen 04:15 AM  History   Chief Complaint Chief Complaint  Patient presents with  . Pruritis    HPI [name] is a 43 y.o. male.     HPI patient has a history of paranoid schizophrenia.  He states he took a trial medication in 1998 and states he only took it 1 time.  He states since that time he has had problems with itching all over.  He states he is here tonight for itching after taking a bath earlier.  He states the itching is the same as what he has had before.  He denies any fever, swelling, or infected spots.  He states tonight his main point concern was he normally itches all over and tonight he mainly itched on his right posterior back.  PCP [name], MD   Past Medical History:  Diagnosis Date  . Bipolar disorder ([place])   . Bronchitis   . Hyperlipidemia   . Paranoid schizophrenia ([place])   . Schizophrenia [facility])     Patient Active Problem List   Diagnosis Date Noted  . Tobacco use disorder   . Schizoaffective disorder, bipolar type ([place]) 06/29/2014    Past Surgical History:  Procedure Laterality Date  . TESTICLE SURGERY          Home Medications    Prior to Admission medications   Medication Sig Start Date End Date Taking? Authorizing Provider  ABILIFY MAINTENA 400 MG PRSY prefilled syringe Inject 400 mg into the muscle every 28 (twenty-eight) days.  03/11/18   [provider]  benztropine (COGENTIN) 1 MG tablet Take 1 tablet (1 mg total) by mouth 2 (two) times daily in the am and at bedtime.. Patient taking differently: Take 1 mg by mouth 2 (two) times daily.  07/21/14   Kerrie Buffalo, NP  buPROPion (WELLBUTRIN XL) 150 MG 24 hr tablet Take 150 mg by mouth daily.     [provider]  fluPHENAZine (PROLIXIN) 5 MG tablet Take 5 mg by mouth daily.    [provider]  hydrOXYzine (ATARAX/VISTARIL) 50 MG tablet Take 50 mg  by mouth.    [provider]  lamoTRIgine (LAMICTAL) 100 MG tablet Take 200 mg by mouth 2 (two) times daily.  03/11/18   [provider]  traZODone (DESYREL) 50 MG tablet Take 1 tablet (50 mg total) by mouth at bedtime. 06/12/18   Milton Ferguson, MD    Family History Family History  Problem Relation Age of Onset  . Schizophrenia Neg Hx     Social History Social History   Tobacco Use  . Smoking status: Current Every Day Smoker    Packs/day: 1.00    Types: Cigarettes  . Smokeless tobacco: Never Used  Substance Use Topics  . Alcohol use: No  . Drug use: No    Comment: patient states that he used to drink and smoke marijuana 6-7 yrs ago  lives alone   Allergies   Other   Review of Systems Review of Systems  All other systems reviewed and are negative.    Physical Exam Updated Vital Signs BP 118/86 (BP Location: Left Arm)   Pulse 78   Temp 97.8 F (36.6 C) (Oral)   Resp 19   SpO2 98%   Physical Exam Vitals signs and nursing note reviewed.  Constitutional:      General: He is not in acute distress.  Appearance: Normal appearance.  HENT:     Head: Normocephalic and atraumatic.     Right Ear: External ear normal.     Left Ear: External ear normal.     Nose: Nose normal.  Eyes:     Extraocular Movements: Extraocular movements intact.     Conjunctiva/sclera: Conjunctivae normal.  Neck:     Musculoskeletal: Normal range of motion.  Cardiovascular:     Rate and Rhythm: Normal rate.  Pulmonary:     Effort: Pulmonary effort is normal. No respiratory distress.  Musculoskeletal: Normal range of motion.        General: No swelling or tenderness.  Skin:    General: Skin is warm and dry.     Findings: No rash.     Comments: Patient told me he came in because he had itching of his right posterior chest and when we look at that area this rash is gone now.  He states it no longer is itching.  He has no obvious other lesions on his lower extremities.   Neurological:     General: No focal deficit present.     Mental Status: He is alert and oriented to person, place, and time. Mental status is at baseline.     Cranial Nerves: No cranial nerve deficit.  Psychiatric:        Attention and Perception: Attention normal.        Mood and Affect: Mood and affect normal.        Speech: Speech normal.        Behavior: Behavior normal.     Comments: Patient tonight is not as paranoid as I have seen him in the past, usually he has some concern about going back to his apartment or concerned about having his father drive him home.      ED Treatments / Results  Labs (all labs ordered are listed, but only abnormal results are displayed) Labs Reviewed - No data to display  EKG None  Radiology No results found.  Procedures Procedures (including critical care time)  Medications Ordered in ED Medications - No data to display   Initial Impression / Assessment and Plan / ED Course  I have reviewed the triage vital signs and the nursing notes.  Pertinent labs & imaging results that were available during my care of the patient were reviewed by me and considered in my medical decision making (see chart for details).       Review of care everywhere shows he [facility].  His diagnosis is primary eczema.  He was prescribed triamcinolone ointment 1%.  He has 3 refills.  Patient states he does have it and he is aware he needs to have a refill done.  He states he is used Benadryl in the past which has helped.  He was encouraged to use it again if needed.  We also discussed avoiding taking a very hot shower as that will make any itching worse.  He was given an ice pack to use if he has a specific spot that itching really badly.  We also discussed reasons to return to the ED such as an infected area of skin.  Final Clinical Impressions(s) / ED Diagnoses   Final diagnoses:   Pruritus    ED Discharge Orders    None    OTC benadryl Use  your triamcinolone cream  Plan discharge  Devoria AlbeIva Ruston Fedora, MD, Armando GangFACEP  Devoria AlbeKnapp, Rossanna Spitzley, MD 11/02/18 [phone]

## 2018-11-02 NOTE — ED Triage Notes (Signed)
Pt C/o itching after walking outside "and being around bugs."

## 2018-11-15 ENCOUNTER — Emergency Department (HOSPITAL_COMMUNITY)
Admission: EM | Admit: 2018-11-15 | Discharge: 2018-11-16 | Discharge status: Against Medical Advice | Payer: Medicare Other | Attending: Emergency Medicine | Admitting: Emergency Medicine

## 2018-11-15 DIAGNOSIS — F29 Unspecified psychosis not due to a substance or known physiological condition: Secondary | ICD-10-CM | POA: Diagnosis present

## 2018-11-15 DIAGNOSIS — Z5321 Procedure and treatment not carried out due to patient leaving prior to being seen by health care provider: Secondary | ICD-10-CM | POA: Insufficient documentation

## 2018-11-16 ENCOUNTER — Other Ambulatory Visit: Payer: Self-pay

## 2018-11-16 ENCOUNTER — Encounter (HOSPITAL_COMMUNITY): Payer: Self-pay | Admitting: Emergency Medicine

## 2018-11-16 NOTE — ED Notes (Signed)
No answer in waiting outside

## 2018-11-16 NOTE — ED Notes (Signed)
No answer in waiting room,  

## 2018-11-16 NOTE — ED Notes (Signed)
No answer in triage.
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
## 2018-11-28 ENCOUNTER — Emergency Department (HOSPITAL_COMMUNITY)
Admission: EM | Admit: 2018-11-28 | Discharge: 2018-11-30 | Disposition: A | Discharge status: Other Acute Inpatient Hospital | Payer: Medicare Other | Attending: Emergency Medicine | Admitting: Emergency Medicine

## 2018-11-28 ENCOUNTER — Other Ambulatory Visit: Payer: Self-pay

## 2018-11-28 ENCOUNTER — Encounter (HOSPITAL_COMMUNITY): Payer: Self-pay | Admitting: Emergency Medicine

## 2018-11-28 DIAGNOSIS — F200 Paranoid schizophrenia: Secondary | ICD-10-CM | POA: Insufficient documentation

## 2018-11-28 DIAGNOSIS — Z20828 Contact with and (suspected) exposure to other viral communicable diseases: Secondary | ICD-10-CM | POA: Insufficient documentation

## 2018-11-28 DIAGNOSIS — Z79899 Other long term (current) drug therapy: Secondary | ICD-10-CM | POA: Diagnosis not present

## 2018-11-28 DIAGNOSIS — Z046 Encounter for general psychiatric examination, requested by authority: Secondary | ICD-10-CM | POA: Diagnosis present

## 2018-11-28 DIAGNOSIS — F17210 Nicotine dependence, cigarettes, uncomplicated: Secondary | ICD-10-CM | POA: Insufficient documentation

## 2018-11-28 DIAGNOSIS — Z03818 Encounter for observation for suspected exposure to other biological agents ruled out: Secondary | ICD-10-CM | POA: Diagnosis not present

## 2018-11-28 DIAGNOSIS — F209 Schizophrenia, unspecified: Secondary | ICD-10-CM | POA: Diagnosis not present

## 2018-11-28 LAB — BASIC METABOLIC PANEL
Anion gap: 10 (ref 5–15)
BUN: 10 mg/dL (ref 6–20)
CO2: 27 mmol/L (ref 22–32)
Calcium: 9.1 mg/dL (ref 8.9–10.3)
Chloride: 103 mmol/L (ref 98–111)
Creatinine, Ser: 1.17 mg/dL (ref 0.61–1.24)
GFR calc Af Amer: >60 mL/min (ref >60)
GFR calc non Af Amer: >60 mL/min (ref >60)
Glucose, Bld: 87 mg/dL (ref 70–99)
Potassium: 3.2 mmol/L — ABNORMAL LOW (ref 3.5–5.1)
Sodium: 140 mmol/L (ref 135–145)

## 2018-11-28 LAB — CBC WITH DIFFERENTIAL/PLATELET
Abs Immature Granulocytes: 0.02 10*3/uL (ref 0.00–0.07)
Basophils Absolute: 0.1 10*3/uL (ref 0.0–0.1)
Basophils Relative: 1 %
Eosinophils Absolute: 0.2 10*3/uL (ref 0.0–0.5)
Eosinophils Relative: 3 %
HCT: 43.7 % (ref 39.0–52.0)
Hemoglobin: 14.9 g/dL (ref 13.0–17.0)
Immature Granulocytes: 0 %
Lymphocytes Relative: 29 %
Lymphs Abs: 2.6 10*3/uL (ref 0.7–4.0)
MCH: 29.5 pg (ref 26.0–34.0)
MCHC: 34.1 g/dL (ref 30.0–36.0)
MCV: 86.5 fL (ref 80.0–100.0)
Monocytes Absolute: 0.5 10*3/uL (ref 0.1–1.0)
Monocytes Relative: 5 %
Neutro Abs: 5.4 10*3/uL (ref 1.7–7.7)
Neutrophils Relative %: 62 %
Platelets: 298 10*3/uL (ref 150–400)
RBC: 5.05 MIL/uL (ref 4.22–5.81)
RDW: 12.3 % (ref 11.5–15.5)
WBC: 8.7 10*3/uL (ref 4.0–10.5)
nRBC: 0 % (ref 0.0–0.2)

## 2018-11-28 LAB — SALICYLATE LEVEL: Salicylate Lvl: <7 mg/dL (ref 2.8–30.0)

## 2018-11-28 LAB — ACETAMINOPHEN LEVEL: Acetaminophen (Tylenol), Serum: <10 ug/mL — ABNORMAL LOW (ref 10–30)

## 2018-11-28 LAB — SARS CORONAVIRUS 2 BY RT PCR (HOSPITAL ORDER, PERFORMED IN ~~LOC~~ HOSPITAL LAB): SARS Coronavirus 2: NEGATIVE

## 2018-11-28 LAB — ETHANOL: Alcohol, Ethyl (B): <10 mg/dL (ref <10)

## 2018-11-28 MED ORDER — LAMOTRIGINE 25 MG PO TABS
200.0000 mg | ORAL_TABLET | Freq: Two times a day (BID) | ORAL | Status: DC
  Administered 2018-11-28 – 2018-11-30 (×4): 200 mg via ORAL
  Filled 2018-11-28 (×4): qty 8

## 2018-11-28 MED ORDER — BENZTROPINE MESYLATE 1 MG PO TABS
1.0000 mg | ORAL_TABLET | Freq: Two times a day (BID) | ORAL | Status: DC
  Administered 2018-11-28 – 2018-11-30 (×4): 1 mg via ORAL
  Filled 2018-11-28 (×4): qty 1

## 2018-11-28 MED ORDER — BUPROPION HCL ER (XL) 150 MG PO TB24
150.0000 mg | ORAL_TABLET | Freq: Every day | ORAL | Status: DC
  Administered 2018-11-28 – 2018-11-30 (×3): 150 mg via ORAL
  Filled 2018-11-28 (×5): qty 1

## 2018-11-28 MED ORDER — OLANZAPINE 5 MG PO TABS
20.0000 mg | ORAL_TABLET | Freq: Every day | ORAL | Status: DC
  Administered 2018-11-28 – 2018-11-29 (×2): 20 mg via ORAL
  Filled 2018-11-28 (×2): qty 4

## 2018-11-28 MED ORDER — DIPHENHYDRAMINE-ZINC ACETATE 2-0.1 % EX CREA
TOPICAL_CREAM | Freq: Every day | CUTANEOUS | Status: DC | PRN
  Filled 2018-11-28: qty 28

## 2018-11-28 MED ORDER — TRAZODONE HCL 50 MG PO TABS
50.0000 mg | ORAL_TABLET | Freq: Every day | ORAL | Status: DC
  Administered 2018-11-28 – 2018-11-29 (×2): 50 mg via ORAL
  Filled 2018-11-28 (×2): qty 1

## 2018-11-28 MED ORDER — POTASSIUM CHLORIDE CRYS ER 20 MEQ PO TBCR
40.0000 meq | EXTENDED_RELEASE_TABLET | Freq: Once | ORAL | Status: AC
  Administered 2018-11-28: 40 meq via ORAL
  Filled 2018-11-28: qty 2
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
## 2018-11-29 DIAGNOSIS — F209 Schizophrenia, unspecified: Secondary | ICD-10-CM | POA: Diagnosis not present

## 2018-11-29 LAB — RAPID URINE DRUG SCREEN, HOSP PERFORMED
Amphetamines: NOT DETECTED
Barbiturates: NOT DETECTED
Benzodiazepines: NOT DETECTED
Cocaine: NOT DETECTED
Opiates: NOT DETECTED
Tetrahydrocannabinol: NOT DETECTED

## 2018-11-29 MED ORDER — STERILE WATER FOR INJECTION IJ SOLN
INTRAMUSCULAR | Status: AC
  Administered 2018-11-29: 1.2 mL
  Filled 2018-11-29: qty 10

## 2018-11-29 MED ORDER — ZIPRASIDONE MESYLATE 20 MG IM SOLR: 10.0000 mg | Freq: Once | INTRAMUSCULAR | Status: DC

## 2018-11-29 MED ORDER — ZIPRASIDONE MESYLATE 20 MG IM SOLR
10.0000 mg | Freq: Once | INTRAMUSCULAR | Status: AC
  Administered 2018-11-29: 10 mg via INTRAMUSCULAR

## 2018-11-29 MED ORDER — LORAZEPAM 1 MG PO TABS
1.0000 mg | ORAL_TABLET | ORAL | Status: DC | PRN
  Administered 2018-11-29 – 2018-11-30 (×3): 1 mg via ORAL
  Filled 2018-11-29 (×3): qty 1

## 2018-11-29 MED ORDER — ZIPRASIDONE MESYLATE 20 MG IM SOLR
INTRAMUSCULAR | Status: AC
  Filled 2018-11-29: qty 20

## 2018-11-29 NOTE — ED Notes (Addendum)
Patient asked hospitalitist walking by if Sam Page [facility] was here. Pt requesting Sam Page.

## 2018-11-29 NOTE — ED Notes (Signed)
Pt calm and cooperative this morning. Denies SI/HI or AVH.

## 2018-11-29 NOTE — ED Notes (Signed)
Pt continues to burst out in random fits of laughter. Remains redirectable, will continue to monitor.
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
## 2018-11-29 NOTE — ED Notes (Signed)
Pt sleeping soundly at this time.

## 2018-11-29 NOTE — [facility] Counselor (Signed)
  REASSESSMENT  Pt re-evaluated for safety and stability.  Pt was observed sitting in the chair alert, pleasant, oriented x3.  Pt denies SI/HI/A/V-hallucinations.   Pt states "I rested well last night."  Pt refused to answer any additional questions.  Pt states "If you think I need to come to [facility], I have no problem going there but I don't want to answer any more questions."  Pt continues to meet inpatient criteria based off chart view and prior assessment.  TTS will continue to look for inpatient placement.   Tashanna Dolin L. [place], [place], [facility], [facility] Therapeutic Triage Specialist  [phone]

## 2018-11-29 NOTE — Progress Notes (Signed)
Patient meets criteria for inpatient treatment. No appropriate or available beds at [facility]. CSW faxed referrals to the following facilities for review:  [facility]  [facility]  [facility]  [place]   TTS will continue to seek bed placement.  Chalmers Guest. Guerry Bruin, MSW, [place] Work/Disposition Phone: [phone] Fax: [phone]

## 2018-11-29 NOTE — ED Notes (Signed)
Pt getting upset and wanting to leave. Pt walked up to the front and told registration "I need to sign myself out of here. I brought myself here and I want to leave. No one will help me when I need help but now that I bring myself here ya'll don't want to help me." Security and [employer] called to assist with pt. Pt is IVC. Pt jerked doors open at triage and walked back to his hallway bed. Pt being loud and demanding to leave. Another RN spoke with MD and obtained order to give Geodon IM injection.

## 2018-11-29 NOTE — ED Notes (Addendum)
Patient states his religion is "vampirism" and that he is vampire himself. States people are out to get him due to this. Patient states he is not gay but he knows how to talk to people. States that he wants to talk dirty but he isn't going to.  Patient hyperactive at this time. Patient experiencing rapid speech, and flight of ideas.

## 2018-11-29 NOTE — ED Notes (Signed)
Pt becoming more animated. Remains pleasant and cooperative, but is obviously responding to internal stimuli. Pt laughing inappropriately and has to be asked multiple times to please sit down.

## 2018-11-29 NOTE — ED Provider Notes (Signed)
Pt became acutely agitated and tried to leave.  He is under IVC and was brought back by security.  He required 10 mg of geodon to calm down.   Isla Pence, MD 11/29/18 1454

## 2018-11-29 NOTE — ED Notes (Signed)
Pt became agitated stating "I don't have to be here, I signed myself in voluntarily." Pt then took off down the hallway out the front door. Security notified.

## 2018-11-29 NOTE — Progress Notes (Signed)
Patient has been accepted at [facility] on tomorrow, 11/30/2018 after 10:00. Accepting provider Dr. Merryl Hacker. Number to call report is [phone]. CSW updated Tori, nurse tech/secretary regarding disposition.   [name]. Guerry Bruin, MSW, [place] Work/Disposition Phone: [phone] Fax: [phone]

## 2018-11-29 NOTE — Progress Notes (Addendum)
CSW received call from [facility] requesting information (IVC paperwork and a UDS). CSW called Herbert Spires, nurse tech/secretary regarding this request. Number to fax IVC paperwork is [phone]. Upon receipt of paperwork and results they will be faxed back over to [place] received call from [facility], informing of lack of bed availability today.   @12 :08 Requested paperwork faxed over to [facility] for continued review.   TTS will continue to seek bed placement.   Chalmers Guest. Guerry Bruin, MSW, [place] Work/Disposition Phone: [phone] Fax: [phone]

## 2018-11-29 NOTE — ED Notes (Signed)
Teaching laboratory technician sitting with pt requests to sign off on pt. Spoke with charge nurse Fabio Neighbors, RN who gave permission. Sign off sheet filled out according to Policy.

## 2018-11-30 DIAGNOSIS — F209 Schizophrenia, unspecified: Secondary | ICD-10-CM | POA: Diagnosis not present

## 2018-11-30 NOTE — ED Provider Notes (Signed)
Pt accepted to [facility], Dr Merryl Hacker. Will transfer stable.    Francine Graven, DO 11/30/18 [phone]

## 2018-11-30 NOTE — ED Notes (Signed)
[facility] at bedside to transport patient. Patient belongings (1 bag) given to officer.

## 2018-11-30 NOTE — ED Notes (Signed)
Pt was given breakfast tray 

## 2018-11-30 NOTE — ED Notes (Signed)
Patient allowed to take shower.

## 2018-11-30 NOTE — ED Notes (Signed)
Patient dropped one of his lamictal medication pills. Removed another 25 mg lamictal from pyxis to give patient full dose.
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
## 2019-05-27 ENCOUNTER — Encounter (HOSPITAL_COMMUNITY): Payer: Self-pay | Admitting: *Deleted

## 2019-05-27 ENCOUNTER — Other Ambulatory Visit: Payer: Self-pay

## 2019-05-27 ENCOUNTER — Emergency Department (HOSPITAL_COMMUNITY)
Admission: EM | Admit: 2019-05-27 | Discharge: 2019-05-27 | Disposition: A | Discharge status: Home / Self Care | Payer: Medicare Other | Attending: Emergency Medicine | Admitting: Emergency Medicine

## 2019-05-27 DIAGNOSIS — Z79899 Other long term (current) drug therapy: Secondary | ICD-10-CM | POA: Diagnosis not present

## 2019-05-27 DIAGNOSIS — L02214 Cutaneous abscess of groin: Secondary | ICD-10-CM | POA: Diagnosis not present

## 2019-05-27 DIAGNOSIS — L0291 Cutaneous abscess, unspecified: Secondary | ICD-10-CM

## 2019-05-27 MED ORDER — LIDOCAINE HCL (PF) 2 % IJ SOLN: 10.0000 mL | Freq: Once | INTRAMUSCULAR | Status: DC

## 2019-05-27 MED ORDER — DOXYCYCLINE HYCLATE 100 MG PO CAPS: 100.0000 mg | ORAL_CAPSULE | Freq: Two times a day (BID) | ORAL | 0 refills | Status: AC

## 2019-05-27 MED ORDER — LIDOCAINE HCL (PF) 1 % IJ SOLN
INTRAMUSCULAR | Status: AC
  Filled 2019-05-27: qty 30

## 2019-05-27 NOTE — ED Provider Notes (Signed)
[facility] EMERGENCY DEPARTMENT Provider Note   CSN: [id number] Arrival date & time: 05/27/19  0923     History Chief Complaint  Patient presents with  . Abscess    [name] is a 44 y.o. male.  HPI   Pt is a 44 y/o male with a h/o bipolar disorder, bronchitis, HLD, schizophrenia, who presents to the ED today for eval of an abscess to the left groin area. He states he noted a knot to the area a few months ago. It has seemed to get larger over the last two weeks and over the last 3 days it has increased in size even more. He has noted some drainage from the area. He states the pain is mild. He denies any fevers, chills, vomiting, or other systemic symptoms. He denies a h/o DM. He has never had similar sxs before.   Past Medical History:  Diagnosis Date  . Bipolar disorder (HCC)   . Bronchitis   . Hyperlipidemia   . Paranoid schizophrenia (HCC)   . Schizophrenia [facility])     Patient Active Problem List   Diagnosis Date Noted  . Tobacco use disorder   . Schizoaffective disorder, bipolar type (HCC) 06/29/2014    Past Surgical History:  Procedure Laterality Date  . TESTICLE SURGERY         Family History  Problem Relation Age of Onset  . Schizophrenia Neg Hx     Social History   Tobacco Use  . Smoking status: Current Every Day Smoker    Packs/day: 1.00    Types: Cigarettes  . Smokeless tobacco: Never Used  Substance Use Topics  . Alcohol use: Yes    Comment: weekly  . Drug use: Yes    Types: Marijuana    Home Medications Prior to Admission medications   Medication Sig Start Date End Date Taking? Authorizing Provider  ABILIFY MAINTENA 400 MG PRSY prefilled syringe Inject 400 mg into the muscle every 28 (twenty-eight) days.  03/11/18   [provider]  benztropine (COGENTIN) 1 MG tablet Take 1 tablet (1 mg total) by mouth 2 (two) times daily in the am and at bedtime.. Patient taking differently: Take 1 mg by mouth 2 (two) times daily.  07/21/14    Adonis Brook, NP  buPROPion (WELLBUTRIN XL) 150 MG 24 hr tablet Take 150 mg by mouth daily.     [provider]  doxycycline (VIBRAMYCIN) 100 MG capsule Take 1 capsule (100 mg total) by mouth 2 (two) times daily for 7 days. 05/27/19 06/03/19  Safiyyah Vasconez S, PA-C  fluPHENAZine (PROLIXIN) 10 MG tablet Take 10 mg by mouth 2 (two) times a day.     [provider]  hydrOXYzine (ATARAX/VISTARIL) 50 MG tablet Take 50 mg by mouth 3 (three) times daily.     [provider]  lamoTRIgine (LAMICTAL) 100 MG tablet Take 200 mg by mouth 2 (two) times daily.  03/11/18   [provider]  OLANZapine (ZYPREXA) 20 MG tablet Take 20 mg by mouth at bedtime.    [provider]  traZODone (DESYREL) 50 MG tablet Take 1 tablet (50 mg total) by mouth at bedtime. 06/12/18   Bethann Berkshire, MD  triamcinolone cream (KENALOG) 0.1 %  10/13/18   [provider]    Allergies    Other  Review of Systems   Review of Systems  Constitutional: Negative for fever.  Respiratory: Negative for shortness of breath.   Cardiovascular: Negative for chest pain.  Gastrointestinal: Negative  for abdominal pain, nausea and vomiting.  Genitourinary: Negative for penile pain, penile swelling, scrotal swelling and testicular pain.  Musculoskeletal: Negative for back pain.  Skin:       Abscess to left groin  Neurological: Negative for headaches.    Physical Exam Updated Vital Signs BP 128/77 (BP Location: Right Arm)   Pulse 90   Temp 98.1 F (36.7 C) (Oral)   Resp 14   Ht 6\' 1"  (1.854 m)   Wt 119.7 kg   SpO2 100%   BMI 34.83 kg/m   Physical Exam Vitals and nursing note reviewed.  Constitutional:      Appearance: He is well-developed.  HENT:     Head: Normocephalic and atraumatic.  Eyes:     Conjunctiva/sclera: Conjunctivae normal.  Cardiovascular:     Rate and Rhythm: Normal rate and regular rhythm.     Heart sounds: No murmur.  Pulmonary:     Effort: Pulmonary  effort is normal. No respiratory distress.     Breath sounds: Normal breath sounds.  Abdominal:     General: Bowel sounds are normal.     Palpations: Abdomen is soft.     Tenderness: There is no abdominal tenderness.  Genitourinary:    Comments: Chaperone present. There is a 1.5x1.5cm area of fluctuance noted to the area just lateral to the scrotum with some surrounding induration. There is no induration, erythema, or swelling noted to the scrotum. There is no testicular TTP, however right testicle feels hard (pt has implant) Musculoskeletal:     Cervical back: Neck supple.  Skin:    General: Skin is warm and dry.  Neurological:     Mental Status: He is alert.     ED Results / Procedures / Treatments   Labs (all labs ordered are listed, but only abnormal results are displayed) Labs Reviewed - No data to display  EKG None  Radiology No results found.  Procedures .Marland KitchenIncision and Drainage  Date/Time: 05/27/2019 8:38 PM Performed by: Rodney Booze, PA-C Authorized by: Rodney Booze, PA-C   Consent:    Consent obtained:  Verbal   Consent given by:  Patient and parent   Risks discussed:  Bleeding, pain, incomplete drainage, infection and damage to other organs Location:    Type:  Abscess   Size:  1.5   Location:  Anogenital   Anogenital location: just lateral to the left side of the scrotum. Pre-procedure details:    Skin preparation:  Chloraprep Sedation:    Sedation type:  Anxiolysis Anesthesia (see MAR for exact dosages):    Anesthesia method:  Local infiltration   Local anesthetic:  Lidocaine 1% w/o epi Procedure type:    Complexity:  Simple Procedure details:    Needle aspiration: no     Incision types:  Stab incision   Incision depth:  Dermal   Scalpel blade:  11   Drainage:  Purulent   Drainage amount:  Moderate   Wound treatment:  Wound left open   Packing materials:  None   (including critical care time)   Medications Ordered in  ED Medications  lidocaine (PF) (XYLOCAINE) 1 % injection (has no administration in time range)    ED Course  I have reviewed the triage vital signs and the nursing notes.  Pertinent labs & imaging results that were available during my care of the patient were reviewed by me and considered in my medical decision making (see chart for details).    MDM Rules/Calculators/A&P  Patient with skin abscess to the area just lateral to the scrotum. No scrotal involvement. No crepitus or other concerning findings to suggest deep space infection. On systemic sxs. Pt well appearing with normal VS.  amenable to incision and drainage.  Abscess was not large enough to warrant packing or drain. Encouraged home warm soaks and flushing.  Mild signs of cellulitis is surrounding skin.  Will d/c to home with abx. Advised on close pcp f/u and return precautions. Pt voices understanding and is in agreement with the plan. All questions answered, pt stable for d/c.  Final Clinical Impression(s) / ED Diagnoses Final diagnoses:  Abscess    Rx / DC Orders ED Discharge Orders         Ordered    doxycycline (VIBRAMYCIN) 100 MG capsule  2 times daily     05/27/19 2036           Karrie Meres, PA-C 05/27/19 2040    Sabas Sous, MD 05/31/19 [phone]

## 2019-05-27 NOTE — Discharge Instructions (Signed)
You were given a prescription for antibiotics. Please take the antibiotic prescription fully.  ° °Please follow up with your primary care provider within 5-7 days for re-evaluation of your symptoms.  ° °Please return to the emergency room immediately if you experience any new or worsening symptoms or any symptoms that indicate worsening infection such as fevers, increased redness/swelling/pain, warmth, or drainage from the affected area.  ° ° °

## 2019-05-27 NOTE — ED Triage Notes (Signed)
Pt with abscess to scrotum for a month, denies N/V or fevers.

## 2019-05-29 ENCOUNTER — Other Ambulatory Visit: Payer: Self-pay

## 2019-05-29 ENCOUNTER — Emergency Department (HOSPITAL_COMMUNITY)
Admission: EM | Admit: 2019-05-29 | Discharge: 2019-05-31 | Disposition: A | Discharge status: Other Acute Inpatient Hospital | Payer: Medicare Other | Source: Home / Self Care | Attending: Emergency Medicine | Admitting: Emergency Medicine

## 2019-05-29 ENCOUNTER — Encounter (HOSPITAL_COMMUNITY): Payer: Self-pay | Admitting: Emergency Medicine

## 2019-05-29 DIAGNOSIS — R440 Auditory hallucinations: Secondary | ICD-10-CM

## 2019-05-29 DIAGNOSIS — Z79899 Other long term (current) drug therapy: Secondary | ICD-10-CM | POA: Insufficient documentation

## 2019-05-29 DIAGNOSIS — F200 Paranoid schizophrenia: Secondary | ICD-10-CM | POA: Insufficient documentation

## 2019-05-29 DIAGNOSIS — F17210 Nicotine dependence, cigarettes, uncomplicated: Secondary | ICD-10-CM | POA: Insufficient documentation

## 2019-05-29 DIAGNOSIS — R4189 Other symptoms and signs involving cognitive functions and awareness: Secondary | ICD-10-CM

## 2019-05-29 DIAGNOSIS — Z20822 Contact with and (suspected) exposure to covid-19: Secondary | ICD-10-CM | POA: Insufficient documentation

## 2019-05-29 LAB — CBC WITH DIFFERENTIAL/PLATELET
Abs Immature Granulocytes: 0.05 10*3/uL (ref 0.00–0.07)
Basophils Absolute: 0.1 10*3/uL (ref 0.0–0.1)
Basophils Relative: 1 %
Eosinophils Absolute: 0.2 10*3/uL (ref 0.0–0.5)
Eosinophils Relative: 2 %
HCT: 46.7 % (ref 39.0–52.0)
Hemoglobin: 15.9 g/dL (ref 13.0–17.0)
Immature Granulocytes: 1 %
Lymphocytes Relative: 26 %
Lymphs Abs: 2.8 10*3/uL (ref 0.7–4.0)
MCH: 30.1 pg (ref 26.0–34.0)
MCHC: 34 g/dL (ref 30.0–36.0)
MCV: 88.4 fL (ref 80.0–100.0)
Monocytes Absolute: 0.8 10*3/uL (ref 0.1–1.0)
Monocytes Relative: 7 %
Neutro Abs: 7.1 10*3/uL (ref 1.7–7.7)
Neutrophils Relative %: 63 %
Platelets: 355 10*3/uL (ref 150–400)
RBC: 5.28 MIL/uL (ref 4.22–5.81)
RDW: 11.9 % (ref 11.5–15.5)
WBC: 11 10*3/uL — ABNORMAL HIGH (ref 4.0–10.5)
nRBC: 0 % (ref 0.0–0.2)

## 2019-05-29 LAB — COMPREHENSIVE METABOLIC PANEL
ALT: 24 U/L (ref 0–44)
AST: 35 U/L (ref 15–41)
Albumin: 4.2 g/dL (ref 3.5–5.0)
Alkaline Phosphatase: 68 U/L (ref 38–126)
Anion gap: 11 (ref 5–15)
BUN: 15 mg/dL (ref 6–20)
CO2: 24 mmol/L (ref 22–32)
Calcium: 9.6 mg/dL (ref 8.9–10.3)
Chloride: 102 mmol/L (ref 98–111)
Creatinine, Ser: 1.1 mg/dL (ref 0.61–1.24)
GFR calc Af Amer: >60 mL/min (ref >60)
GFR calc non Af Amer: >60 mL/min (ref >60)
Glucose, Bld: 102 mg/dL — ABNORMAL HIGH (ref 70–99)
Potassium: 4.3 mmol/L (ref 3.5–5.1)
Sodium: 137 mmol/L (ref 135–145)
Total Bilirubin: 0.5 mg/dL (ref 0.3–1.2)
Total Protein: 7.4 g/dL (ref 6.5–8.1)

## 2019-05-29 LAB — SALICYLATE LEVEL: Salicylate Lvl: <7 mg/dL — ABNORMAL LOW (ref 7.0–30.0)

## 2019-05-29 LAB — ETHANOL: Alcohol, Ethyl (B): <10 mg/dL (ref <10)

## 2019-05-29 LAB — ACETAMINOPHEN LEVEL: Acetaminophen (Tylenol), Serum: <10 ug/mL — ABNORMAL LOW (ref 10–30)

## 2019-05-29 MED ORDER — OLANZAPINE 5 MG PO TABS
20.0000 mg | ORAL_TABLET | Freq: Every day | ORAL | Status: DC
  Administered 2019-05-30: 20 mg via ORAL
  Filled 2019-05-29: qty 4

## 2019-05-29 MED ORDER — BENZTROPINE MESYLATE 1 MG PO TABS
1.0000 mg | ORAL_TABLET | ORAL | Status: DC
  Administered 2019-05-30 – 2019-05-31 (×3): 1 mg via ORAL
  Filled 2019-05-29 (×3): qty 1

## 2019-05-29 MED ORDER — BUPROPION HCL ER (XL) 150 MG PO TB24
150.0000 mg | ORAL_TABLET | Freq: Every day | ORAL | Status: DC
  Administered 2019-05-30: 150 mg via ORAL
  Filled 2019-05-29 (×4): qty 1

## 2019-05-29 MED ORDER — HYDROXYZINE HCL 25 MG PO TABS: 50.0000 mg | ORAL_TABLET | Freq: Three times a day (TID) | ORAL | Status: DC | PRN

## 2019-05-29 MED ORDER — FLUPHENAZINE HCL 5 MG PO TABS
ORAL_TABLET | ORAL | Status: AC
  Filled 2019-05-29: qty 2

## 2019-05-29 MED ORDER — TRAZODONE HCL 50 MG PO TABS: 50.0000 mg | ORAL_TABLET | Freq: Every day | ORAL | Status: DC

## 2019-05-29 MED ORDER — FLUPHENAZINE HCL 5 MG PO TABS
10.0000 mg | ORAL_TABLET | Freq: Two times a day (BID) | ORAL | Status: DC
  Administered 2019-05-30: 10:00:00 10 mg via ORAL
  Filled 2019-05-29 (×2): qty 2
  Filled 2019-05-29 (×2): qty 1
  Filled 2019-05-29: qty 2
  Filled 2019-05-29 (×2): qty 1
  Filled 2019-05-29: qty 2

## 2019-05-29 MED ORDER — LAMOTRIGINE 25 MG PO TABS
200.0000 mg | ORAL_TABLET | Freq: Two times a day (BID) | ORAL | Status: DC
  Administered 2019-05-30 – 2019-05-31 (×3): 200 mg via ORAL
  Filled 2019-05-29 (×3): qty 8

## 2019-05-29 MED ORDER — DOXYCYCLINE HYCLATE 100 MG PO TABS
100.0000 mg | ORAL_TABLET | Freq: Two times a day (BID) | ORAL | Status: DC
  Administered 2019-05-30 – 2019-05-31 (×3): 100 mg via ORAL
  Filled 2019-05-29 (×2): qty 1

## 2019-05-29 NOTE — Progress Notes (Signed)
Disposition: [name], NP recommends inpt tx. [facility] at capacity. TTS to seek placement. [name] nurse and EDP Dr. Manus Gunning, MD have been advised.

## 2019-05-29 NOTE — BH Assessment (Signed)
Tele Assessment Note   Patient Name: [name] MRN: [id number] Referring Physician: Arthor Captain, PA-C Location of Patient: [facility] Location of Provider: Behavioral Health TTS Department  [name] is an 44 y.o. male who presents to the ED voluntarily. Pt reports he has been experiencing AH that tell him they are going to kill him in his sleep. Pt states that his apartment is haunted by ghosts of people that lived there before and people that killed his friends. Pt states he has lived in the apartment for 18 years but he does not feel safe returning there. Pt states his deceased friends talk to him but he state they are nice Pt is afraid of the ghosts that are saying they will kill him. Pt has multiple ED visits c/o paranoid schizophrenia. Pt was admitted to [facility] in 2020 for similar presentation.   TTS spoke with the pt's mother with the pt's consent. [name] mother reports the pt has a hx of schizophrenia and she states she is unaware if the pt is taking his psych meds.   Disposition: James Conn, NP recommends inpt tx. [facility] at capacity. TTS to seek placement. [name] nurse and EDP Dr. Manus Gunning, MD have been advised.  Diagnosis: Paranoid Schizophrenia  Past Medical History:  Past Medical History:  Diagnosis Date  . Bipolar disorder (HCC)   . Bronchitis   . Hyperlipidemia   . Paranoid schizophrenia (HCC)   . Schizophrenia [facility]     Past Surgical History:  Procedure Laterality Date  . TESTICLE SURGERY      Family History:  Family History  Problem Relation Age of Onset  . Schizophrenia Neg Hx     Social History:  reports that he has been smoking cigarettes. He has been smoking about 1.00 pack per day. He has never used smokeless tobacco. He reports current alcohol use. He reports current drug use. Drug: Marijuana.  Additional Social History:  Alcohol / Drug Use Pain Medications: See MAR Prescriptions: See MAR Over the Counter: See MAR History of alcohol / drug  use?: Yes Substance #1 Name of Substance 1: Alcohol 1 - Age of First Use: 20 1 - Amount (size/oz): 1 beer 1 - Frequency: rare 1 - Duration: ongoing 1 - Last Use / Amount: 1 week ago  CIWA: CIWA-Ar BP: (!) 155/77 Pulse Rate: 92 COWS:    Allergies:  Allergies  Allergen Reactions  . Other     Some trial medicine from [facility].     Home Medications: (Not in a hospital admission)   OB/GYN Status:  No LMP for male patient.  General Assessment Data Location of Assessment: [facility] TTS Assessment: In system Is this a Tele or Face-to-Face Assessment?: Tele Assessment Is this an Initial Assessment or a Re-assessment for this encounter?: Initial Assessment Patient Accompanied by:: N/A Language Other than English: No Living Arrangements: Other (Comment) What gender do you identify as?: Male Marital status: Single Pregnancy Status: No Living Arrangements: Alone Can pt return to current living arrangement?: Yes Admission Status: Voluntary Is patient capable of signing voluntary admission?: Yes Referral Source: Self/Family/Friend Insurance type: [facility]     Crisis Care Plan Living Arrangements: Alone Name of Psychiatrist: [facility] Name of Therapist: [facility]  Education Status Is patient currently in school?: No Is the patient employed, unemployed or receiving disability?: Receiving disability income  Risk to self with the past 6 months Suicidal Ideation: No Has patient been a risk to self within the past 6 months prior to admission? :  No Suicidal Intent: No Has patient had any suicidal intent within the past 6 months prior to admission? : No Is patient at risk for suicide?: No Suicidal Plan?: No Has patient had any suicidal plan within the past 6 months prior to admission? : No Access to Means: No What has been your use of drugs/alcohol within the last 12 months?: alcohol Previous Attempts/Gestures: No Triggers for Past Attempts: None known Intentional Self Injurious  Behavior: None Family Suicide History: No Recent stressful life event(s): Other (Comment)([facility]) Persecutory voices/beliefs?: Yes Depression: Yes Depression Symptoms: Despondent Substance abuse history and/or treatment for substance abuse?: No Suicide prevention information given to non-admitted patients: Not applicable  Risk to Others within the past 6 months Homicidal Ideation: No Does patient have any lifetime risk of violence toward others beyond the six months prior to admission? : No Thoughts of Harm to Others: No Current Homicidal Intent: No Current Homicidal Plan: No Access to Homicidal Means: No History of harm to others?: No Assessment of Violence: None Noted Does patient have access to weapons?: No Criminal Charges Pending?: No Does patient have a court date: No Is patient on probation?: No  Psychosis Hallucinations: Auditory, Visual Delusions: Unspecified  Mental Status Report Appearance/Hygiene: Unremarkable Eye Contact: Fair Motor Activity: Freedom of movement Speech: Pressured Level of Consciousness: Quiet/awake Mood: Anxious, Preoccupied Affect: Frightened, Anxious, Preoccupied Anxiety Level: Severe Thought Processes: Flight of Ideas Orientation: Person, Place, Time, Situation Obsessive Compulsive Thoughts/Behaviors: Moderate  Cognitive Functioning Concentration: Fair Memory: Remote Intact, Recent Intact Is patient IDD: No Insight: Poor Impulse Control: Fair Appetite: Good Have you had any weight changes? : No Change Sleep: No Change Total Hours of Sleep: 6 Vegetative Symptoms: None  ADLScreening [facility] Assessment Services) Patient's cognitive ability adequate to safely complete daily activities?: Yes Patient able to express need for assistance with ADLs?: Yes Independently performs ADLs?: Yes (appropriate for developmental age)  Prior Inpatient Therapy Prior Inpatient Therapy: Yes Prior Therapy Dates: 2020 Prior Therapy Facilty/Provider(s):  [facility] Reason for Treatment: Paranoid schizophrenia  Prior Outpatient Therapy Prior Outpatient Therapy: Yes Prior Therapy Dates: ongoing Prior Therapy Facilty/Provider(s): [facility] Reason for Treatment: med management Does patient have an ACCT team?: No Does patient have Intensive In-House Services?  : No Does patient have [facility] services? : No Does patient have P4CC services?: No  ADL Screening (condition at time of admission) Patient's cognitive ability adequate to safely complete daily activities?: Yes Is the patient deaf or have difficulty hearing?: No Does the patient have difficulty seeing, even when wearing glasses/contacts?: No Does the patient have difficulty concentrating, remembering, or making decisions?: No Patient able to express need for assistance with ADLs?: Yes Does the patient have difficulty dressing or bathing?: No Independently performs ADLs?: Yes (appropriate for developmental age) Does the patient have difficulty walking or climbing stairs?: No Weakness of Legs: None Weakness of Arms/Hands: None  Home Assistive Devices/Equipment Home Assistive Devices/Equipment: None    Abuse/Neglect Assessment (Assessment to be complete while patient is alone) Abuse/Neglect Assessment Can Be Completed: Yes Physical Abuse: Denies Verbal Abuse: Denies Sexual Abuse: Denies Exploitation of patient/patient's resources: Denies Self-Neglect: Denies     Merchant navy officer (For Healthcare) Does Patient Have a Medical Advance Directive?: No Would patient like information on creating a medical advance directive?: No - Patient declined          Disposition: James Conn, NP recommends inpt tx. [facility] at capacity. TTS to seek placement.   Disposition Initial Assessment Completed for this Encounter: Yes Disposition of Patient: Admit  Type of inpatient treatment program: Adult Patient refused recommended treatment: No  This service was provided via telemedicine  using a 2-way, interactive audio and video technology.  Names of all persons participating in this telemedicine service and their role in this encounter. Name:  [name] Role: Patient  Name: Lind Covert Role: TTS          Lyanne Co 05/29/2019 10:50 PM

## 2019-05-29 NOTE — ED Triage Notes (Addendum)
Pt states that he moved into a new place and that the last person that lived there died in the home and now "the ghost" has been talking to him. Pt says the voices told him that they were going to kill him in his sleep. Hx of paranoid schizophrenia.

## 2019-05-29 NOTE — ED Provider Notes (Signed)
[facility] EMERGENCY DEPARTMENT Provider Note   CSN: [id number] Arrival date & time: 05/29/19  2059     History Chief Complaint  Patient presents with  . Psychiatric Evaluation    [name] is a 44 y.o. male with a past medical history of bipolar disorder, paranoid schizophrenia who presents the emergency department for auditory hallucinations.  Patient states that he believes his auditory hallucinations began 6 years ago when his older brother died.  He states that he felt like he could not go to the funeral because he could not deal with it.  Since then he has been haunted by what he thinks is "paranormal activity."  Patient states that his apartment was formally lived in by a man who killed himself by the name of [name].  He states that [name] is also angry at him because he thinks that he slept with his girlfriend.  He hears multiple voices including [name] girlfriend.  He denies SI or HI.  Patient states that he is scared to live in that house.  States that he wanted to spend the night at his mother's house because he was afraid however she would not allow it.  He states "I just cannot go back there."  HPI     Past Medical History:  Diagnosis Date  . Bipolar disorder ([place])   . Bronchitis   . Hyperlipidemia   . Paranoid schizophrenia ([place])   . Schizophrenia [facility])     Patient Active Problem List   Diagnosis Date Noted  . Tobacco use disorder   . Schizoaffective disorder, bipolar type ([place]) 06/29/2014    Past Surgical History:  Procedure Laterality Date  . TESTICLE SURGERY         Family History  Problem Relation Age of Onset  . Schizophrenia Neg Hx     Social History   Tobacco Use  . Smoking status: Current Every Day Smoker    Packs/day: 1.00    Types: Cigarettes  . Smokeless tobacco: Never Used  Substance Use Topics  . Alcohol use: Yes    Comment: weekly  . Drug use: Yes    Types: Marijuana    Home Medications Prior to Admission  medications   Medication Sig Start Date End Date Taking? Authorizing Provider  ABILIFY MAINTENA 400 MG PRSY prefilled syringe Inject 400 mg into the muscle every 28 (twenty-eight) days.  03/11/18   [provider]  benztropine (COGENTIN) 1 MG tablet Take 1 tablet (1 mg total) by mouth 2 (two) times daily in the am and at bedtime.. Patient taking differently: Take 1 mg by mouth 2 (two) times daily.  07/21/14   Kerrie Buffalo, NP  buPROPion (WELLBUTRIN XL) 150 MG 24 hr tablet Take 150 mg by mouth daily.     [provider]  doxycycline (VIBRAMYCIN) 100 MG capsule Take 1 capsule (100 mg total) by mouth 2 (two) times daily for 7 days. 05/27/19 06/03/19  Couture, Cortni S, PA-C  fluPHENAZine (PROLIXIN) 10 MG tablet Take 10 mg by mouth 2 (two) times a day.     [provider]  hydrOXYzine (ATARAX/VISTARIL) 50 MG tablet Take 50 mg by mouth 3 (three) times daily.     [provider]  lamoTRIgine (LAMICTAL) 100 MG tablet Take 200 mg by mouth 2 (two) times daily.  03/11/18   [provider]  OLANZapine (ZYPREXA) 20 MG tablet Take 20 mg by mouth at bedtime.    [provider]  traZODone (DESYREL) 50  MG tablet Take 1 tablet (50 mg total) by mouth at bedtime. 06/12/18   Bethann Berkshire, MD  triamcinolone cream (KENALOG) 0.1 %  10/13/18   [provider]    Allergies    Other  Review of Systems   Review of Systems Ten systems reviewed and are negative for acute change, except as noted in the HPI.   Physical Exam Updated Vital Signs BP (!) 155/77 (BP Location: Right Arm)   Pulse 92   Temp 98.1 F (36.7 C) (Oral)   Resp 17   Ht 6\' 1"  (1.854 m)   Wt 119 kg   SpO2 97%   BMI 34.61 kg/m   Physical Exam Vitals and nursing note reviewed.  Constitutional:      General: He is not in acute distress.    Appearance: He is well-developed. He is not diaphoretic.  HENT:     Head: Normocephalic and atraumatic.  Eyes:     General: No scleral  icterus.    Conjunctiva/sclera: Conjunctivae normal.  Cardiovascular:     Rate and Rhythm: Normal rate and regular rhythm.     Heart sounds: Normal heart sounds.  Pulmonary:     Effort: Pulmonary effort is normal. No respiratory distress.     Breath sounds: Normal breath sounds.  Abdominal:     Palpations: Abdomen is soft.     Tenderness: There is no abdominal tenderness.  Musculoskeletal:     Cervical back: Normal range of motion and neck supple.  Skin:    General: Skin is warm and dry.  Neurological:     Mental Status: He is alert.  Psychiatric:        Attention and Perception: He perceives auditory hallucinations.        Mood and Affect: Mood is anxious. Affect is labile, flat and tearful.        Behavior: Behavior normal. Behavior is cooperative.        Thought Content: Thought content is paranoid and delusional. Thought content does not include homicidal or suicidal ideation.     ED Results / Procedures / Treatments   Labs (all labs ordered are listed, but only abnormal results are displayed) Labs Reviewed  RAPID URINE DRUG SCREEN, HOSP PERFORMED  CBC WITH DIFFERENTIAL/PLATELET  COMPREHENSIVE METABOLIC PANEL  ETHANOL  ACETAMINOPHEN LEVEL  SALICYLATE LEVEL    EKG None  Radiology No results found.  Procedures Procedures (including critical care time)  Medications Ordered in ED Medications - No data to display  ED Course  I have reviewed the triage vital signs and the nursing notes.  Pertinent labs & imaging results that were available during my care of the patient were reviewed by me and considered in my medical decision making (see chart for details).    MDM Rules/Calculators/A&P                      Patient with delusions and hallucinations. NO SI/HI. Labs are pending. Sign out given to Dr.  Final Clinical Impression(s) / ED Diagnoses Final diagnoses:  None    Rx / DC Orders ED Discharge Orders    None       Manus Gunning,  PA-C 05/29/19 2145    2146, MD 05/29/19 2238

## 2019-05-30 LAB — RAPID URINE DRUG SCREEN, HOSP PERFORMED
Amphetamines: NOT DETECTED
Barbiturates: NOT DETECTED
Benzodiazepines: NOT DETECTED
Cocaine: NOT DETECTED
Opiates: NOT DETECTED
Tetrahydrocannabinol: NOT DETECTED

## 2019-05-30 LAB — RESPIRATORY PANEL BY RT PCR (FLU A&B, COVID)
Influenza A by PCR: NEGATIVE
Influenza B by PCR: NEGATIVE
SARS Coronavirus 2 by RT PCR: NEGATIVE

## 2019-05-30 MED ORDER — NICOTINE 21 MG/24HR TD PT24
21.0000 mg | MEDICATED_PATCH | Freq: Once | TRANSDERMAL | Status: DC
  Administered 2019-05-30: 21 mg via TRANSDERMAL
  Filled 2019-05-30: qty 1

## 2019-05-30 NOTE — Progress Notes (Addendum)
Pt is under review at [facility]. COVID results and UDS need to be faxed once completed.   Ruthann Cancer MSW, LCSWA Clincal Social Worker Disposition  [facility] Ph: [phone] Fax: [phone]

## 2019-05-30 NOTE — [facility] Counselor (Signed)
  REASSESSMENT  [facility] reevaluated pt for safety and stability.  Pt was observed sitting on the side of the bed, alert, cooperative, and oriented x3.  Pt denies SI/HI.  Pt states, "I'm feeling okay this morning, but it's different in my apartment.  I hear voices and see things because my apartment is haunted.  Someone died in there before I moved in it and they didn't tell me about it.  The voices are getting worse lately and I need some help."  Pt continues to meet inpatient criteria due to A/V-hallucinations.  CSW will continue to look for inpatient placement.  Darrien Laakso L. Layci Stenglein, MS, [facility], [facility] Therapeutic Triage Specialist  [phone]

## 2019-05-30 NOTE — Progress Notes (Signed)
Patient meets criteria for inpatient treatment per Hillery Jacks, NP. No appropriate beds at [facility] currently. CSW faxed referrals to the following facilities for review:  [facility]   [facility]   [facility]   [facility]   [facility]   [facility]   [facility]   [facility]   [facility]    TTS will continue to seek bed placement.     Ruthann Cancer MSW, [facility] Clincal Social Worker Disposition  [facility] Ph: [phone] Fax: [phone] 05/30/2019 11:11 AM

## 2019-05-31 ENCOUNTER — Inpatient Hospital Stay (HOSPITAL_COMMUNITY)
Admission: AD | Admit: 2019-05-31 | Discharge: 2019-06-03 | DRG: 885 | Disposition: A | Discharge status: Home / Self Care | Payer: Medicare Other | Source: Intra-hospital | Attending: Psychiatry | Admitting: Psychiatry

## 2019-05-31 ENCOUNTER — Other Ambulatory Visit: Payer: Self-pay

## 2019-05-31 ENCOUNTER — Encounter (HOSPITAL_COMMUNITY): Payer: Self-pay | Admitting: Psychiatry

## 2019-05-31 DIAGNOSIS — F17210 Nicotine dependence, cigarettes, uncomplicated: Secondary | ICD-10-CM | POA: Diagnosis present

## 2019-05-31 DIAGNOSIS — Z20822 Contact with and (suspected) exposure to covid-19: Secondary | ICD-10-CM | POA: Diagnosis present

## 2019-05-31 DIAGNOSIS — G4700 Insomnia, unspecified: Secondary | ICD-10-CM | POA: Diagnosis present

## 2019-05-31 DIAGNOSIS — Z79899 Other long term (current) drug therapy: Secondary | ICD-10-CM | POA: Diagnosis not present

## 2019-05-31 DIAGNOSIS — Z23 Encounter for immunization: Secondary | ICD-10-CM | POA: Diagnosis not present

## 2019-05-31 DIAGNOSIS — F200 Paranoid schizophrenia: Secondary | ICD-10-CM | POA: Diagnosis present

## 2019-05-31 DIAGNOSIS — L239 Allergic contact dermatitis, unspecified cause: Secondary | ICD-10-CM | POA: Diagnosis present

## 2019-05-31 MED ORDER — HYDROXYZINE HCL 50 MG PO TABS
50.0000 mg | ORAL_TABLET | Freq: Three times a day (TID) | ORAL | Status: DC | PRN
  Administered 2019-06-02: 50 mg via ORAL
  Filled 2019-05-31: qty 1

## 2019-05-31 MED ORDER — OLANZAPINE 10 MG PO TABS
20.0000 mg | ORAL_TABLET | Freq: Every day | ORAL | Status: DC
  Administered 2019-05-31 – 2019-06-01 (×2): 20 mg via ORAL
  Filled 2019-05-31 (×4): qty 2

## 2019-05-31 MED ORDER — BUPROPION HCL ER (XL) 150 MG PO TB24
150.0000 mg | ORAL_TABLET | Freq: Every day | ORAL | Status: DC
  Filled 2019-05-31 (×5): qty 1

## 2019-05-31 MED ORDER — DOXYCYCLINE HYCLATE 100 MG PO TABS
100.0000 mg | ORAL_TABLET | Freq: Two times a day (BID) | ORAL | Status: DC
  Administered 2019-05-31 – 2019-06-03 (×6): 100 mg via ORAL
  Filled 2019-05-31 (×11): qty 1

## 2019-05-31 MED ORDER — INFLUENZA VAC SPLIT QUAD 0.5 ML IM SUSY
0.5000 mL | PREFILLED_SYRINGE | INTRAMUSCULAR | Status: AC
  Administered 2019-06-01: 0.5 mL via INTRAMUSCULAR
  Filled 2019-05-31: qty 0.5

## 2019-05-31 MED ORDER — LAMOTRIGINE 200 MG PO TABS
200.0000 mg | ORAL_TABLET | Freq: Two times a day (BID) | ORAL | Status: DC
  Administered 2019-05-31 – 2019-06-01 (×2): 200 mg via ORAL
  Filled 2019-05-31 (×4): qty 1
  Filled 2019-05-31: qty 2
  Filled 2019-05-31 (×2): qty 1

## 2019-05-31 MED ORDER — TRAZODONE HCL 50 MG PO TABS
50.0000 mg | ORAL_TABLET | Freq: Every day | ORAL | Status: DC
  Administered 2019-05-31 – 2019-06-02 (×3): 50 mg via ORAL
  Filled 2019-05-31 (×7): qty 1

## 2019-05-31 MED ORDER — FLUPHENAZINE HCL 10 MG PO TABS
10.0000 mg | ORAL_TABLET | Freq: Two times a day (BID) | ORAL | Status: DC
  Administered 2019-05-31 – 2019-06-03 (×6): 10 mg via ORAL
  Filled 2019-05-31 (×5): qty 1
  Filled 2019-05-31: qty 2
  Filled 2019-05-31 (×5): qty 1

## 2019-05-31 MED ORDER — BENZTROPINE MESYLATE 1 MG PO TABS
1.0000 mg | ORAL_TABLET | ORAL | Status: DC
  Administered 2019-05-31 – 2019-06-03 (×6): 1 mg via ORAL
  Filled 2019-05-31 (×11): qty 1

## 2019-05-31 MED ORDER — NICOTINE 21 MG/24HR TD PT24
21.0000 mg | MEDICATED_PATCH | Freq: Once | TRANSDERMAL | Status: AC
  Administered 2019-05-31 – 2019-06-01 (×2): 21 mg via TRANSDERMAL
  Filled 2019-05-31 (×2): qty 1

## 2019-05-31 NOTE — Tx Team (Signed)
Initial Treatment Plan 05/31/2019 6:13 PM FEDRICK CEFALU [id number]    PATIENT STRESSORS: Loss of room mate   PATIENT STRENGTHS: Capable of independent living Communication skills General fund of knowledge   PATIENT IDENTIFIED PROBLEMS: I am feeling Paranoid  My voices are really bad                    DISCHARGE CRITERIA:  Improved stabilization in mood, thinking, and/or behavior  PRELIMINARY DISCHARGE PLAN: Return to previous living arrangement  PATIENT/FAMILY INVOLVEMENT: This treatment plan has been presented to and reviewed with the patient, [name], and/or family member,The patient and family have been given the opportunity to ask questions and make suggestions.  Jerrye Bushy, RN 05/31/2019, 6:13 PM

## 2019-05-31 NOTE — Progress Notes (Signed)
   05/31/19 2200  Psych Admission Type (Psych Patients Only)  Admission Status Voluntary  Psychosocial Assessment  Patient Complaints Anxiety;Suspiciousness  Eye Contact Fair  Facial Expression Sad  Affect Preoccupied  Speech Slow  Interaction Assertive  Motor Activity Slow  Appearance/Hygiene Disheveled  Behavior Characteristics Cooperative  Mood Anxious;Pleasant;Preoccupied  Aggressive Behavior  Effect No apparent injury  Thought Process  Coherency WDL  Content WDL  Delusions WDL  Perception Hallucinations  Hallucination Auditory;Visual  Judgment Impaired  Confusion WDL  Danger to Self  Current suicidal ideation? Denies  Danger to Others  Danger to Others None reported or observed

## 2019-05-31 NOTE — Progress Notes (Signed)
Pt accepted to [facility]; 503-1.    Denzil Magnuson NP is the accepting provider.    Dr. Jeannine Kitten is the attending provider.    Call report to [phone].     Bryan @ [facility] notified.     Pt is Voluntary.    Pt may be transported by [employer].   Pt scheduled to arrive at [facility] after 1:00pm.   Drucilla Schmidt, MSW, LCSW-A Clinical Disposition Social Worker [name]/TTS [phone]

## 2019-05-31 NOTE — ED Notes (Signed)
Pt given meal tray.

## 2019-05-31 NOTE — Progress Notes (Signed)
Patient ID: [name], male   DOB: Feb 07, 1976, 44 y.o.   MRN: [id number] Patient was admitted to the hospital due to increased auditory hallucinations after the death of his roommate.  Patient reports that the [facility] are exceedingly loud and make him paranoid. Skin assessment complete and patient found to be free of all injury.  Patient admitted to the unit without incident.

## 2019-05-31 NOTE — Progress Notes (Signed)
Adult Psychoeducational Group Note  Date:  05/31/2019 Time:  11:50 PM  Group Topic/Focus:  Wrap-Up Group:   The focus of this group is to help patients review their daily goal of treatment and discuss progress on daily workbooks.  Participation Level:  Active  Participation Quality:  Appropriate  Affect:  Appropriate  Cognitive:  Appropriate  Insight: Appropriate  Engagement in Group:  Developing/Improving  Modes of Intervention:  Discussion  Additional Comments:  Pt stated his goal for today was to focus on his treatment plan. Pt stated he accomplished his goal today. Pt stated his relationship with his family has improved since he was admitted. Pt stated been about to contact his parents and aunt today improved his day. Pt rated his overall day a 10. Pt stated his appetite was pretty good  today. Pt stated his sleep last night was pretty good. Pt stated he was in no physical pain today.   Pt admitted auditory and visual hallucinations. Writer asked Pt when was the last time he experience auditory or visual hallucinations. Pt stated about 5 mins ago. Pt then went on to stated he has been experience auditory and visual hallucinations more since his brother has passed. Pt nurse was made aware of the situation. Pt denies thoughts of harming himself or others. Pt stated he would alert staff if anything changes.  Felipa Furnace 05/31/2019, 11:50 PM

## 2019-05-31 NOTE — ED Notes (Signed)
Attempted to call report, no answer

## 2019-06-01 DIAGNOSIS — F200 Paranoid schizophrenia: Principal | ICD-10-CM

## 2019-06-01 LAB — LIPID PANEL
Cholesterol: 216 mg/dL — ABNORMAL HIGH (ref 0–200)
HDL: 35 mg/dL — ABNORMAL LOW (ref >40)
LDL Cholesterol: 138 mg/dL — ABNORMAL HIGH (ref 0–99)
Total CHOL/HDL Ratio: 6.2 RATIO
Triglycerides: 217 mg/dL — ABNORMAL HIGH (ref <150)
VLDL: 43 mg/dL — ABNORMAL HIGH (ref 0–40)

## 2019-06-01 LAB — HEMOGLOBIN A1C
Hgb A1c MFr Bld: 5.2 % (ref 4.8–5.6)
Mean Plasma Glucose: 102.54 mg/dL

## 2019-06-01 LAB — TSH: TSH: 2.268 u[IU]/mL (ref 0.350–4.500)

## 2019-06-01 MED ORDER — LAMOTRIGINE 100 MG PO TABS
100.0000 mg | ORAL_TABLET | Freq: Two times a day (BID) | ORAL | Status: DC
  Administered 2019-06-01 – 2019-06-03 (×4): 100 mg via ORAL
  Filled 2019-06-01 (×8): qty 1

## 2019-06-01 NOTE — H&P (Signed)
Psychiatric Admission Assessment Adult  Patient Identification: [name] MRN:  [id number] Date of Evaluation:  06/01/2019 Chief Complaint:  Paranoid schizophrenia (HCC) [F20.0] Principal Diagnosis: Exacerbation of underlying psychotic disorder Diagnosis:  Active Problems:   Paranoid schizophrenia (HCC)  History of Present Illness:   This is the latest of numerous encounters with our healthcare system for [name], he is 30 and carries a diagnosis of a schizoaffective bipolar type condition.  He had presented to the emergency department on 1/28 complaining of a skin abscess in the groin area was prescribed doxycycline, he returned on 1/30 to the emergency department complaining of a ghost "talking to him" elaborating that the ghosts were going to "kill him in his sleep".  The patient is currently followed by an act team despite this intervention he has a history of noncompliance, and has a recent hospitalizations in 2020 at [facility].  The patient acknowledges he is suffering from auditory hallucinations but he did not elaborate the content to me.  He denied visual hallucinations.  He denies wanting to harm self or others, had expressed the delusional belief that his apartment was "haunted" and is hesitant to return there he denies this on my interview and acknowledges that this may be his "schizophrenia".  Drug screen negative for all compounds tested, the patient has been on long-acting injectable Abilify, he has been on lumateperone most recently in combination with Prolixin, he has been on olanzapine bupropion, oral aripiprazole, Depakote, lithium, Prolixin decanoate, according to the old chart.  He reports none of them have been particularly successful Associated Signs/Symptoms: Depression Symptoms:  insomnia, (Hypo) Manic Symptoms:  Delusions, Distractibility, Anxiety Symptoms:  Excessive Worry, Psychotic Symptoms:  Delusions, Hallucinations: Auditory Visual PTSD  Symptoms: Had a traumatic exposure:  Reports death of brother did not elaborate Total Time spent with patient: 45 minutes  Past Psychiatric History: Again multiple medication trials most recently the combination of lumateperone and Prolixin  Is the patient at risk to self? Yes.    Has the patient been a risk to self in the past 6 months? No.  Has the patient been a risk to self within the distant past? Yes.    Is the patient a risk to others? No.  Has the patient been a risk to others in the past 6 months? No.  Has the patient been a risk to others within the distant past? No.   Prior Inpatient Therapy:   Prior Outpatient Therapy:    Alcohol Screening: 1. How often do you have a drink containing alcohol?: Never 2. How many drinks containing alcohol do you have on a typical day when you are drinking?: 1 or 2 3. How often do you have six or more drinks on one occasion?: Never AUDIT-C Score: 0 4. How often during the last year have you found that you were not able to stop drinking once you had started?: Never 5. How often during the last year have you failed to do what was normally expected from you becasue of drinking?: Never 6. How often during the last year have you needed a first drink in the morning to get yourself going after a heavy drinking session?: Never 7. How often during the last year have you had a feeling of guilt of remorse after drinking?: Never 8. How often during the last year have you been unable to remember what happened the night before because you had been drinking?: Never 9. Have you or someone else been injured as a result  of your drinking?: No 10. Has a relative or friend or a doctor or another health worker been concerned about your drinking or suggested you cut down?: No Alcohol Use Disorder Identification Test Final Score (AUDIT): 0 Substance Abuse History in the last 12 months:  denies Consequences of Substance Abuse: NA Previous Psychotropic Medications: Yes   Psychological Evaluations: No  Past Medical History:  Past Medical History:  Diagnosis Date  . Bipolar disorder (HCC)   . Bronchitis   . Hyperlipidemia   . Paranoid schizophrenia (HCC)   . Schizophrenia [facility])     Past Surgical History:  Procedure Laterality Date  . TESTICLE SURGERY     Family History:  Family History  Problem Relation Age of Onset  . Schizophrenia Neg Hx    Family Psychiatric  History:  Tobacco Screening:   Social History:  Social History   Substance and Sexual Activity  Alcohol Use Yes   Comment: weekly     Social History   Substance and Sexual Activity  Drug Use Yes  . Types: Marijuana    Additional Social History:                           Allergies:   Allergies  Allergen Reactions  . Other     Some trial medicine from [facility].    Lab Results:  Results for orders placed or performed during the hospital encounter of 05/31/19 (from the past 48 hour(s))  Hemoglobin A1c     Status: None   Collection Time: 06/01/19  6:21 AM  Result Value Ref Range   Hgb A1c MFr Bld 5.2 4.8 - 5.6 %    Comment: (NOTE) Pre diabetes:          5.7%-6.4% Diabetes:              >6.4% Glycemic control for   <7.0% adults with diabetes    Mean Plasma Glucose 102.54 mg/dL    Comment: Performed at [facility], [address], [place], [place] [address]  Lipid panel     Status: Abnormal   Collection Time: 06/01/19  6:21 AM  Result Value Ref Range   Cholesterol 216 (H) 0 - 200 mg/dL   Triglycerides 096 (H) <150 mg/dL   HDL 35 (L) >28 mg/dL   Total CHOL/HDL Ratio 6.2 RATIO   VLDL 43 (H) 0 - 40 mg/dL   LDL Cholesterol 366 (H) 0 - 99 mg/dL    Comment:        Total Cholesterol/HDL:CHD Risk Coronary Heart Disease Risk Table                     Men   Women  1/2 Average Risk   3.4   3.3  Average Risk       5.0   4.4  2 X Average Risk   9.6   7.1  3 X Average Risk  23.4   11.0        Use the calculated Patient Ratio above and the CHD Risk  Table to determine the patient's CHD Risk.        ATP III CLASSIFICATION (LDL):  <100     mg/dL   Optimal  294-765  mg/dL   Near or Above                    Optimal  130-159  mg/dL   Borderline  465-035  mg/dL  High  >190     mg/dL   Very High Performed at [facility], [address], [place], [place] [address]   TSH     Status: None   Collection Time: 06/01/19  6:21 AM  Result Value Ref Range   TSH 2.268 0.350 - 4.500 uIU/mL    Comment: Performed by a 3rd Generation assay with a functional sensitivity of <=0.01 uIU/mL. Performed at [facility], [address], [place], [place] [address]     Blood Alcohol level:  Lab Results  Component Value Date   ETH <10 05/29/2019   ETH <10 11/28/2018    Metabolic Disorder Labs:  Lab Results  Component Value Date   HGBA1C 5.2 06/01/2019   MPG 102.54 06/01/2019   No results found for: PROLACTIN Lab Results  Component Value Date   CHOL 216 (H) 06/01/2019   TRIG 217 (H) 06/01/2019   HDL 35 (L) 06/01/2019   CHOLHDL 6.2 06/01/2019   VLDL 43 (H) 06/01/2019   LDLCALC 138 (H) 06/01/2019    Current Medications: Current Facility-Administered Medications  Medication Dose Route Frequency Provider Last Rate Last Admin  . benztropine (COGENTIN) tablet 1 mg  1 mg Oral Jamelle Haring, NP   1 mg at 06/01/19 5361  . buPROPion (WELLBUTRIN XL) 24 hr tablet 150 mg  150 mg Oral Daily Denzil Magnuson, NP      . doxycycline (VIBRA-TABS) tablet 100 mg  100 mg Oral BID Denzil Magnuson, NP   100 mg at 06/01/19 4431  . fluPHENAZine (PROLIXIN) tablet 10 mg  10 mg Oral BID Denzil Magnuson, NP   10 mg at 06/01/19 5400  . hydrOXYzine (ATARAX/VISTARIL) tablet 50 mg  50 mg Oral Q8H PRN Denzil Magnuson, NP      . influenza vac split quadrivalent PF (FLUARIX) injection 0.5 mL  0.5 mL Intramuscular Tomorrow-1000 Malvin Johns, MD      . lamoTRIgine (LAMICTAL) tablet 100 mg  100 mg Oral BID Malvin Johns, MD       . [address] nicotine (NICODERM CQ - dosed in mg/24 hours) patch 21 mg  21 mg Transdermal Once Denzil Magnuson, NP   21 mg at 06/01/19 8676  . OLANZapine (ZYPREXA) tablet 20 mg  20 mg Oral QHS Denzil Magnuson, NP   20 mg at 05/31/19 2124  . traZODone (DESYREL) tablet 50 mg  50 mg Oral QHS Denzil Magnuson, NP   50 mg at 05/31/19 2123   PTA Medications: Medications Prior to Admission  Medication Sig Dispense Refill Last Dose  . ABILIFY MAINTENA 400 MG PRSY prefilled syringe Inject 400 mg into the muscle every 28 (twenty-eight) days.      . benztropine (COGENTIN) 1 MG tablet Take 1 tablet (1 mg total) by mouth 2 (two) times daily in the am and at bedtime.. (Patient taking differently: Take 1 mg by mouth 3 (three) times daily. ) 60 tablet 0   . buPROPion (WELLBUTRIN XL) 150 MG 24 hr tablet Take 150 mg by mouth daily.      . CAPLYTA 42 MG CAPS Take 1 capsule by mouth daily.     Marland Kitchen doxycycline (VIBRAMYCIN) 100 MG capsule Take 1 capsule (100 mg total) by mouth 2 (two) times daily for 7 days. 14 capsule 0   . fluPHENAZine (PROLIXIN) 10 MG tablet Take 10 mg by mouth 2 (two) times a day.      . hydrOXYzine (ATARAX/VISTARIL) 50 MG tablet Take 50 mg by mouth 3 (three) times daily  as needed for anxiety.      . lamoTRIgine (LAMICTAL) 100 MG tablet Take 100-200 mg by mouth See admin instructions. Take 100 mg every morning and 200 mg every evening.     Marland Kitchen OLANZapine (ZYPREXA) 20 MG tablet Take 20 mg by mouth at bedtime.     . traZODone (DESYREL) 50 MG tablet Take 100 mg by mouth at bedtime.     . triamcinolone cream (KENALOG) 0.1 % Apply 1 application topically daily as needed (eczema).        Musculoskeletal: Strength & Muscle Tone: within normal limits Gait & Station: normal Patient leans: N/A  Psychiatric Specialty Exam: Physical Exam  Nursing note and vitals reviewed. Constitutional: He appears well-developed and well-nourished.  Cardiovascular: Normal rate and regular rhythm.    Review of  Systems  Constitutional: Negative.   Eyes: Negative.   Respiratory: Negative.   Cardiovascular: Negative.   Endocrine: Negative.   Allergic/Immunologic: Negative.   Neurological: Negative.     Blood pressure 115/85, pulse 96, temperature 98 F (36.7 C), temperature source Oral, resp. rate 16, height 6\' 1"  (1.854 m), weight 119 kg, SpO2 99 %.Body mass index is 34.61 kg/m.  General Appearance: Casual  Eye Contact:  Good  Speech:  Clear and Coherent  Volume:  Normal  Mood:  Euthymic  Affect:  Full Range  Thought Process:  Goal Directed and Descriptions of Associations: Tangential  Orientation:  Full (Time, Place, and Person)  Thought Content:  Hallucinations: Auditory Visual and Paranoid Ideation  Suicidal Thoughts:  No  Homicidal Thoughts:  No  Memory:  Immediate;   Fair Recent;   Fair Remote;   Fair  Judgement:  Fair  Insight:  Fair  Psychomotor Activity:  Normal  Concentration:  Concentration: Fair and Attention Span: Fair  Recall:  [employer] of Knowledge:  Fair  Language:  Good  Akathisia:  Negative  Handed:  Right  AIMS (if indicated):     Assets:  Communication Skills Desire for Improvement  ADL's:  Intact  Cognition:  WNL  Sleep:  Number of Hours: 8    Treatment Plan Summary: Daily contact with patient to assess and evaluate symptoms and progress in treatment and Medication management  Observation Level/Precautions:  15 minute checks  Laboratory:  UDS  Psychotherapy: Reality based med and illness education  Medications: Confirm long-acting injectable with act team  Consultations:    Discharge Concerns: Longer-term stability  Estimated LOS: 7-10  Other: Axis I schizoaffective disorder bipolar type acute exacerbation Continue oral antipsychotics, explore options with act team Axis II deferred Axis III medically stable   Physician Treatment Plan for Primary Diagnosis: Discussed basic risk-benefit side effects of antipsychotic medications and implications  of his condition Long Term Goal(s): Improvement in symptoms so as ready for discharge  Short Term Goals: Compliance with prescribed medications will improve  Physician Treatment Plan for Secondary Diagnosis: Active Problems:   Paranoid schizophrenia ([place])  Long Term Goal(s): Improvement in symptoms so as ready for discharge  Short Term Goals: Ability to identify triggers associated with substance abuse/mental health issues will improve  I certify that inpatient services furnished can reasonably be expected to improve the patient's condition.    Johnn Hai, MD 2/2/202112:41 PM

## 2019-06-01 NOTE — Progress Notes (Signed)
Recreation Therapy Notes  Date: 2.2.21 Time: 1000 Location: [address] Dayroom  Group Topic: Anger Management  Goal Area(s) Addresses:  Patient will identify triggers for anger.  Patient will identify a situation that makes them angry.  Patient will identify what other emotions comes with anger.   Behavioral Response: None  Intervention: Worksheet  Activity: Anger Thermometer.  Patients were to identify at least 6 things that get them angry.  Patients were to then rank those things on the thermometer from 1 (calm) to 10 (the angriest you can get). Patients then had to identify 5 positive coping skills to help deal with anger.  Education: Anger Management, Discharge Planning   Education Outcome: Acknowledges education/In group clarification offered/Needs additional education.   Clinical Observations/Feedback: Pt came in for the last few minutes of group and observed.    Caroll Rancher, LRT/CTRS     Caroll Rancher A 06/01/2019 11:28 AM

## 2019-06-01 NOTE — BHH Counselor (Signed)
Adult Comprehensive Assessment  Patient ID: [name], male   DOB: [birth year], 44 y.o.   MRN: [id number]  Information Source: Information source: Patient  Current Stressors:  Patient states their primary concerns and needs for treatment are:: "Paranoia. I was hearing voices" Patient states their goals for this hospitilization and ongoing recovery are:: "Go home" Educational / Learning stressors: Pt denies stressors Employment / Job issues: Pt denies stressor. Patient is currently unemployed and receives a disability check. Family Relationships: Pt denies stressors. Financial / Lack of resources (include bankruptcy): Pt reports that he is not over his finances and he wishes that he was. Housing / Lack of housing: Pt reports that he has housing but wants to move but the waitlist is too long Physical health (include injuries & life threatening diseases): Pt denies stressors Social relationships: Pt denies stressors Substance abuse: Patient endorses drinking beer on occassion. Bereavement / Loss: Pt denies stressors  Living/Environment/Situation:  Living Arrangements: Alone Living conditions (as described by patient or guardian): "It's alright" Who else lives in the home?: Patient lives alone How long has patient lived in current situation?: 7 years What is atmosphere in current home: Comfortable, Quarry manager, Supportive  Family History:  Marital status: Single Are you sexually active?: No What is your sexual orientation?: Heterosexual Has your sexual activity been affected by drugs, alcohol, medication, or emotional stress?: No Does patient have children?: No  Childhood History:  By whom was/is the patient raised?: Both parents Additional childhood history information: Parents divorced when pt was in 4th grade.   Description of patient's relationship with caregiver when they were a child: "I used to be on my own."   Patient's description of current relationship with people who raised  him/her: Parents - "It's okay. Sometimes my mom looks at me weird" Does patient have siblings?: Yes Number of Siblings: 3 Description of patient's current relationship with siblings: 3 brothers - "It's okay."   Did patient suffer any verbal/emotional/physical/sexual abuse as a child?: No Did patient suffer from severe childhood neglect?: No Has patient ever been sexually abused/assaulted/raped as an adolescent or adult?: No Was the patient ever a victim of a crime or a disaster?: No Witnessed domestic violence?: No Has patient been effected by domestic violence as an adult?: No  Education:  Highest grade of school patient has completed: Some college Currently a Ship broker?: No Learning disability?: No  Employment/Work Situation:   Employment situation: On disability Why is patient on disability: Schizophrenia  How long has patient been on disability: 71 Patient's job has been impacted by current illness: No What is the longest time patient has a held a job?: 1 1/2 year Where was the patient employed at that time?: [employer]  Did You Receive Any Psychiatric Treatment/Services While in the [employer]?: No Are There Guns or Other Weapons in [place]?: No Are These Weapons Safely Secured?: Yes  Financial Resources:   Financial resources: Teacher, early years/pre, Support from parents / caregiver, Medicare Does patient have a Programmer, applications or guardian?: Yes Name of representative payee or guardian: Patient reports that his payee is finanical pathway through [facility]  Alcohol/Substance Abuse:   What has been your use of drugs/alcohol within the last 12 months?: Alcohol (beer). Pt reports that he drinks on occassion. If attempted suicide, did drugs/alcohol play a role in this?: No Alcohol/Substance Abuse Treatment Hx: Denies past history Has alcohol/substance abuse ever caused legal problems?: No  Social Support System:   Patient's Community Support System: Banker  System: [name] mom and dad Type of faith/religion: Ephriam Knuckles How does patient's faith help to cope with current illness?: "It does sometimes".  Leisure/Recreation:   Leisure and Hobbies: Video games. Patient reports wanting to get the new PS5 console.  Strengths/Needs:   What is the patient's perception of their strengths?: Strong Will Patient states these barriers may affect/interfere with their treatment: N/A Patient states these barriers may affect their return to the community: N/A Other important information patient would like considered in planning for their treatment: N/A  Discharge Plan:   Currently receiving community mental health services: Yes (From Whom)([facility] ACTT team) Patient states concerns and preferences for aftercare planning are: Patient reports he wants to continue with his ACTT team Patient states they will know when they are safe and ready for discharge when: "when I do not hear the voices" Does patient have access to transportation?: No Does patient have financial barriers related to discharge medications?: Yes Plan for no access to transportation at discharge: CSW will help assist with transportation. Will patient be returning to same living situation after discharge?: Yes  Summary/Recommendations:   Summary and Recommendations (to be completed by the evaluator): Patient is a 44 year old male who presented to the emergency department on 1/28 complaining of a skin abscess in the groin area was prescribed doxycycline, he returned on 1/30 to the emergency department complaining of a ghost "talking to him" elaborating that the ghosts were going to "kill him in his sleep". [name] diagnosis is: Paranoid schizophrenia (HCC). Recommendations for pt include: crisis stabilization, therapeutic milieu, medication management, attend and participate in group therapy, and development of a comprehensive mental wellness plan.  Delphia Grates. 06/01/2019

## 2019-06-01 NOTE — Progress Notes (Signed)
Recreation Therapy Notes  INPATIENT RECREATION THERAPY ASSESSMENT  Patient Details Name: [name] MRN: [id number] DOB: 1975-11-05 Today's Date: 06/01/2019       Information Obtained From: Patient  Able to Participate in Assessment/Interview: Yes  Patient Presentation: Alert  Reason for Admission (Per Patient): Other (Comments)(Hearing voices, Paranoid)  Patient Stressors: (None identified)  Coping Skills:   TV, Music, Prayer, Talk, Avoidance  Leisure Interests (2+):  Games - Video games, Individual - Other (Comment), Music - Listen(Watch movies)  Frequency of Recreation/Participation: (Video Games- Not lately; Watch Movies- Weekly; Listen to Music- Daily)  Awareness of Community Resources:  Yes  Community Resources:  Public affairs consultant, Other (Comment)([employer] [place])  Current Use: Yes  If no, Barriers?:    Expressed Interest in [employer] Information: No  [place] of Residence:  [place]  Patient Main Form of Transportation: Set designer  Patient Strengths:  Controling anger  Patient Identified Areas of Improvement:  Stop procrastinating  Patient Goal for Hospitalization:  "be able to go back home"  Current SI (including self-harm):  No  Current HI:  No  Current AVH: Yes(Hearing/seeing things every once in awhile)  Staff Intervention Plan: Group Attendance, Collaborate with Interdisciplinary Treatment Team  Consent to Intern Participation: N/A     Caroll Rancher, LRT/CTRS  Caroll Rancher A 06/01/2019, 11:48 AM

## 2019-06-01 NOTE — BHH Suicide Risk Assessment (Signed)
[facility] Admission Suicide Risk Assessment   Nursing information obtained from:  Patient Demographic factors:  Male Current Mental Status:  NA Loss Factors:  NA Historical Factors:  NA Risk Reduction Factors:  NA  Total Time spent with patient: 45 minutes Principal Problem: Schizoaffective disorder Diagnosis:  Active Problems:   Paranoid schizophrenia (HCC)  Subjective Data: Patient admitted for exacerbation of underlying psychotic disorder  Continued Clinical Symptoms:  Alcohol Use Disorder Identification Test Final Score (AUDIT): 0 The "Alcohol Use Disorders Identification Test", Guidelines for Use in Primary Care, Second Edition.  World Science writer [facility]). Score between 0-7:  no or low risk or alcohol related problems. Score between 8-15:  moderate risk of alcohol related problems. Score between 16-19:  high risk of alcohol related problems. Score 20 or above:  warrants further diagnostic evaluation for alcohol dependence and treatment.   CLINICAL FACTORS:   Previous Psychiatric Diagnoses and Treatments Musculoskeletal: Strength & Muscle Tone: within normal limits Gait & Station: normal Patient leans: N/A  Psychiatric Specialty Exam: Physical Exam  Nursing note and vitals reviewed. Constitutional: He appears well-developed and well-nourished.  Cardiovascular: Normal rate and regular rhythm.    Review of Systems  Constitutional: Negative.   Eyes: Negative.   Respiratory: Negative.   Cardiovascular: Negative.   Endocrine: Negative.   Allergic/Immunologic: Negative.   Neurological: Negative.     Blood pressure 115/85, pulse 96, temperature 98 F (36.7 C), temperature source Oral, resp. rate 16, height 6\' 1"  (1.854 m), weight 119 kg, SpO2 99 %.Body mass index is 34.61 kg/m.  General Appearance: Casual  Eye Contact:  Good  Speech:  Clear and Coherent  Volume:  Normal  Mood:  Euthymic  Affect:  Full Range  Thought Process:  Goal Directed and Descriptions of  Associations: Tangential  Orientation:  Full (Time, Place, and Person)  Thought Content:  Hallucinations: Auditory Visual and Paranoid Ideation  Suicidal Thoughts:  No  Homicidal Thoughts:  No  Memory:  Immediate;   Fair Recent;   Fair Remote;   Fair  Judgement:  Fair  Insight:  Fair  Psychomotor Activity:  Normal  Concentration:  Concentration: Fair and Attention Span: Fair  Recall:  of Knowledge:  Fair  Language:  Good  Akathisia:  Negative  Handed:  Right  AIMS (if indicated):     Assets:  Communication Skills Desire for Improvement  ADL's:  Intact  Cognition:  WNL  Sleep:  Number of Hours: 8    COGNITIVE FEATURES THAT CONTRIBUTE TO RISK:  Polarized thinking    SUICIDE RISK:   Minimal: No identifiable suicidal ideation.  Patients presenting with no risk factors but with morbid ruminations; may be classified as minimal risk based on the severity of the depressive symptoms  PLAN OF CARE: see eval  I certify that inpatient services furnished can reasonably be expected to improve the patient's condition.   [employer], MD 06/01/2019, 12:55 PM

## 2019-06-01 NOTE — Progress Notes (Signed)
Patient denies SI, HI and AVH this shift.  Patient has had no incidents of behavioral dyscontrol on the unit.  Patent has been compliant with medications, attended groups and engaged in unit activities.   Assess patient for safety offer medications as prescribe, offer 1:1 therapeutic talks.   Continue to monitor as planned.  Patient was able to contract for safety.  

## 2019-06-01 NOTE — Progress Notes (Signed)
   06/01/19 2100  Psych Admission Type (Psych Patients Only)  Admission Status Voluntary  Psychosocial Assessment  Patient Complaints Anxiety;Suspiciousness  Eye Contact Fair  Facial Expression Sad  Affect Preoccupied  Speech Slow  Interaction Assertive  Motor Activity Slow  Appearance/Hygiene Disheveled  Behavior Characteristics Cooperative  Mood Anxious;Pleasant  Aggressive Behavior  Effect No apparent injury  Thought Process  Coherency WDL  Content WDL  Delusions WDL  Perception Hallucinations  Hallucination Auditory;Visual  Judgment Impaired  Confusion WDL  Danger to Self  Current suicidal ideation? Denies  Danger to Others  Danger to Others None reported or observed

## 2019-06-02 LAB — PROLACTIN: Prolactin: 8.1 ng/mL (ref 4.0–15.2)

## 2019-06-02 NOTE — Progress Notes (Signed)
[facility] MD Progress Note  06/02/2019 8:18 AM [name]  MRN:  [id number] Subjective:   This is the latest of numerous encounters with our healthcare system for [name], he is 89 and carries a diagnosis of a schizoaffective bipolar type condition.  He had presented to the emergency department on 1/28 complaining of a skin abscess in the groin area was prescribed doxycycline, he returned on 1/30 to the emergency department complaining of a ghost "talking to him" elaborating that the ghosts were going to "kill him in his sleep".  Over the past 24 hours the patient is generally kept to himself, he continues to endorse auditory hallucinations that are less intense and less frequent and still describes "outside" his head.  When I asked him to clarify his medications he is unclear exactly what he is taking now, current med list includes not only long-acting injectable aripiprazole but lumateperone, olanzapine, and fluphenazine.  Patient is unsure what his regimen is he simply takes "what is in the package" however we will call his act team, [facility] to [facility] and try to clarify this today.  Denies wanting to harm self.  More reality based and thinks he "may need to move out of his place" as his delusional beliefs seem to be focused on the location  Principal Problem: Schizophrenia, has been coded as schizoaffective in the past Diagnosis: Active Problems:   Paranoid schizophrenia (HCC)  Total Time spent with patient: 20 minutes  Past Psychiatric History: As above it is unclear which of these 4 antipsychotics are ongoing other than the long-acting injectable  Past Medical History:  Past Medical History:  Diagnosis Date  . Bipolar disorder (HCC)   . Bronchitis   . Hyperlipidemia   . Paranoid schizophrenia (HCC)   . Schizophrenia [facility])     Past Surgical History:  Procedure Laterality Date  . TESTICLE SURGERY     Family History:  Family History  Problem Relation Age of Onset  .  Schizophrenia Neg Hx    Family Psychiatric  History: See eval Social History:  Social History   Substance and Sexual Activity  Alcohol Use Yes   Comment: weekly     Social History   Substance and Sexual Activity  Drug Use Yes  . Types: Marijuana    Social History   Socioeconomic History  . Marital status: Single    Spouse name: Not on file  . Number of children: Not on file  . Years of education: Not on file  . Highest education level: Not on file  Occupational History  . Occupation: On disability  Tobacco Use  . Smoking status: Current Every Day Smoker    Packs/day: 1.00    Types: Cigarettes  . Smokeless tobacco: Never Used  Substance and Sexual Activity  . Alcohol use: Yes    Comment: weekly  . Drug use: Yes    Types: Marijuana  . Sexual activity: Yes    Birth control/protection: Condom  Other Topics Concern  . Not on file  Social History Narrative   Pt lives alone in apartment complex for mentally ill   Social Determinants of Health   Financial Resource Strain:   . Difficulty of Paying Living Expenses: Not on file  Food Insecurity:   . Worried About Programme researcher, broadcasting/film/video in the Last Year: Not on file  . Ran Out of Food in the Last Year: Not on file  Transportation Needs:   . Lack of Transportation (Medical): Not on  file  . Lack of Transportation (Non-Medical): Not on file  Physical Activity:   . Days of Exercise per Week: Not on file  . Minutes of Exercise per Session: Not on file  Stress:   . Feeling of Stress : Not on file  Social Connections:   . Frequency of Communication with Friends and Family: Not on file  . Frequency of Social Gatherings with Friends and Family: Not on file  . Attends Religious Services: Not on file  . Active Member of Clubs or Organizations: Not on file  . Attends Banker Meetings: Not on file  . Marital Status: Not on file   Additional Social History:                         Sleep:  Fair  Appetite:  Fair  Current Medications: Current Facility-Administered Medications  Medication Dose Route Frequency Provider Last Rate Last Admin  . benztropine (COGENTIN) tablet 1 mg  1 mg Oral Jamelle Haring, NP   1 mg at 06/02/19 0759  . buPROPion (WELLBUTRIN XL) 24 hr tablet 150 mg  150 mg Oral Daily Denzil Magnuson, NP      . doxycycline (VIBRA-TABS) tablet 100 mg  100 mg Oral BID Denzil Magnuson, NP   100 mg at 06/02/19 0759  . fluPHENAZine (PROLIXIN) tablet 10 mg  10 mg Oral BID Denzil Magnuson, NP   10 mg at 06/02/19 0759  . hydrOXYzine (ATARAX/VISTARIL) tablet 50 mg  50 mg Oral Q8H PRN Denzil Magnuson, NP      . lamoTRIgine (LAMICTAL) tablet 100 mg  100 mg Oral BID Malvin Johns, MD   100 mg at 06/02/19 0759  . [COMPLETED] nicotine (NICODERM CQ - dosed in mg/24 hours) patch 21 mg  21 mg Transdermal Once Denzil Magnuson, NP   21 mg at 06/01/19 0938  . OLANZapine (ZYPREXA) tablet 20 mg  20 mg Oral QHS Denzil Magnuson, NP   20 mg at 06/01/19 2107  . traZODone (DESYREL) tablet 50 mg  50 mg Oral QHS Denzil Magnuson, NP   50 mg at 06/01/19 2107    Lab Results:  Results for orders placed or performed during the hospital encounter of 05/31/19 (from the past 48 hour(s))  Hemoglobin A1c     Status: None   Collection Time: 06/01/19  6:21 AM  Result Value Ref Range   Hgb A1c MFr Bld 5.2 4.8 - 5.6 %    Comment: (NOTE) Pre diabetes:          5.7%-6.4% Diabetes:              >6.4% Glycemic control for   <7.0% adults with diabetes    Mean Plasma Glucose 102.54 mg/dL    Comment: Performed at [facility], [address] [place], [place] [address]  Lipid panel     Status: Abnormal   Collection Time: 06/01/19  6:21 AM  Result Value Ref Range   Cholesterol 216 (H) 0 - 200 mg/dL   Triglycerides 371 (H) <150 mg/dL   HDL 35 (L) >69 mg/dL   Total CHOL/HDL Ratio 6.2 RATIO   VLDL 43 (H) 0 - 40 mg/dL   LDL Cholesterol 678 (H) 0 - 99 mg/dL    Comment:        Total  Cholesterol/HDL:CHD Risk Coronary Heart Disease Risk Table                     Men  Women  1/2 Average Risk   3.4   3.3  Average Risk       5.0   4.4  2 X Average Risk   9.6   7.1  3 X Average Risk  23.4   11.0        Use the calculated Patient Ratio above and the CHD Risk Table to determine the patient's CHD Risk.        ATP III CLASSIFICATION (LDL):  <100     mg/dL   Optimal  967-591  mg/dL   Near or Above                    Optimal  130-159  mg/dL   Borderline  638-466  mg/dL   High  >599     mg/dL   Very High Performed at [facility], [address], [place], [place] [address]   Prolactin     Status: None   Collection Time: 06/01/19  6:21 AM  Result Value Ref Range   Prolactin 8.1 4.0 - 15.2 ng/mL    Comment: (NOTE) Performed At: [facility] [address] [place], [place] [id number] Jolene Schimke MD PQ:[id number]   TSH     Status: None   Collection Time: 06/01/19  6:21 AM  Result Value Ref Range   TSH 2.268 0.350 - 4.500 uIU/mL    Comment: Performed by a 3rd Generation assay with a functional sensitivity of <=0.01 uIU/mL. Performed at [facility], [address], [place], [place] [address]     Blood Alcohol level:  Lab Results  Component Value Date   [facility] <10 05/29/2019   ETH <10 11/28/2018    Metabolic Disorder Labs: Lab Results  Component Value Date   HGBA1C 5.2 06/01/2019   MPG 102.54 06/01/2019   Lab Results  Component Value Date   PROLACTIN 8.1 06/01/2019   Lab Results  Component Value Date   CHOL 216 (H) 06/01/2019   TRIG 217 (H) 06/01/2019   HDL 35 (L) 06/01/2019   CHOLHDL 6.2 06/01/2019   VLDL 43 (H) 06/01/2019   LDLCALC 138 (H) 06/01/2019    Physical Findings: AIMS:  , ,  ,  ,    CIWA:    COWS:     Musculoskeletal: Strength & Muscle Tone: within normal limits Gait & Station: normal Patient leans: N/A  Psychiatric Specialty Exam: Physical Exam  Review of Systems QTC 438  Blood  pressure 113/82, pulse 89, temperature 97.6 F (36.4 C), temperature source Oral, resp. rate 20, height 6\' 1"  (1.854 m), weight 119 kg, SpO2 99 %.Body mass index is 34.61 kg/m.  General Appearance: Casual  Eye Contact:  Good  Speech:  Clear and Coherent  Volume:  Decreased  Mood:  Anxious and Dysphoric  Affect:  Appropriate and Congruent  Thought Process:  Goal Directed and Descriptions of Associations: Circumstantial  Orientation:  Full (Time, Place, and Person)  Thought Content:  Hallucinations: Auditory  Suicidal Thoughts:  No  Homicidal Thoughts:  No  Memory:  Immediate;   Fair Recent;   Fair Remote;   Fair  Judgement:  Fair  Insight:  Fair  Psychomotor Activity:  Normal  Concentration:  Concentration: Fair and Attention Span: Fair  Recall:  of Knowledge:  Fair  Language:  Fair  Akathisia:  Negative  Handed:  Right  AIMS (if indicated):     Assets:  Leisure Time Physical Health  ADL's:  Intact  Cognition:  WNL  Sleep:  Number of Hours: 7.75   Treatment Plan Summary: Daily contact with patient to assess and evaluate symptoms and progress in treatment and Medication management  Simplify antipsychotic regimen, clarify antipsychotic regimen with act team today Continue current precautions reality based therapy Probable discharge towards the end the week Continue to monitor every 15 minutes for safety  Lacharles Altschuler,[place], MD 06/02/2019, 8:18 AM

## 2019-06-02 NOTE — Progress Notes (Signed)
   06/02/19 2300  Psych Admission Type (Psych Patients Only)  Admission Status Voluntary  Psychosocial Assessment  Patient Complaints Anxiety  Eye Contact Fair  Facial Expression Flat;Sullen;Sad  Affect Appropriate to circumstance;Preoccupied;Sad;Sullen  Speech Logical/coherent;Slow  Interaction Assertive  Motor Activity Slow  Appearance/Hygiene Unremarkable  Behavior Characteristics Cooperative  Mood Preoccupied  Thought Process  Coherency WDL  Content WDL  Delusions WDL  Perception Hallucinations  Hallucination Auditory  Judgment Poor  Confusion None  Danger to Self  Current suicidal ideation? Denies  Danger to Others  Danger to Others None reported or observed   Pt stated AH- getting better.

## 2019-06-02 NOTE — Plan of Care (Signed)
Nurse discussed anxiety, depression and coping skills with patient.  

## 2019-06-02 NOTE — Progress Notes (Signed)
D:   Patient denied SI and HI, contracts for safety.   Patient denied A/V hallucinations.  Denied pain. A:  Medications administered per MD orders.  Emotional support and encouragement given patient. R:  Safety maintained with 15 minute checks.

## 2019-06-02 NOTE — Progress Notes (Signed)
Recreation Therapy Notes  Date: 2.3.21 Time: 1000 Location: 500 Hall Dayroom  Group Topic: Leisure Education  Goal Area(s) Addresses:  Patient will identify positive leisure activities.  Patient will identify one positive benefit of participation in leisure activities.   Behavioral Response: None  Intervention: Leisure Group Game  Activity: LRT and patients played a Surveyor, minerals.  Each person will get a slip of paper with an activity on it out of the container and draw it on the board.  The rest of the group will attempt the guess the activity being drawn.  The person with guesses correctly gets the next turn.   Education:  Leisure Education, Building control surveyor  Education Outcome: Acknowledges education/In group clarification offered/Needs additional education  Clinical Observations/Feedback: Pt arrived late to group but did not participate.  Pt sat with his head down and fell asleep.    Caroll Rancher, LRT/CTRS     Caroll Rancher A 06/02/2019 11:19 AM

## 2019-06-02 NOTE — Tx Team (Signed)
Interdisciplinary Treatment and Diagnostic Plan Update  06/02/2019 Time of Session: 11:10am AVERY KLINGBEIL MRN: [id number]  Principal Diagnosis: <principal problem not specified>  Secondary Diagnoses: Active Problems:   Paranoid schizophrenia ([place])   Current Medications:  Current Facility-Administered Medications  Medication Dose Route Frequency Provider Last Rate Last Admin  . benztropine (COGENTIN) tablet 1 mg  1 mg Oral Lucretia Field, NP   1 mg at 06/02/19 0759  . buPROPion (WELLBUTRIN XL) 24 hr tablet 150 mg  150 mg Oral Daily Mordecai Maes, NP      . doxycycline (VIBRA-TABS) tablet 100 mg  100 mg Oral BID Mordecai Maes, NP   100 mg at 06/02/19 0759  . fluPHENAZine (PROLIXIN) tablet 10 mg  10 mg Oral BID Mordecai Maes, NP   10 mg at 06/02/19 0759  . hydrOXYzine (ATARAX/VISTARIL) tablet 50 mg  50 mg Oral Q8H PRN Mordecai Maes, NP      . lamoTRIgine (LAMICTAL) tablet 100 mg  100 mg Oral BID Johnn Hai, MD   100 mg at 06/02/19 0759  . traZODone (DESYREL) tablet 50 mg  50 mg Oral QHS Mordecai Maes, NP   50 mg at 06/01/19 2107   PTA Medications: Medications Prior to Admission  Medication Sig Dispense Refill Last Dose  . ABILIFY MAINTENA 400 MG PRSY prefilled syringe Inject 400 mg into the muscle every 28 (twenty-eight) days.      . benztropine (COGENTIN) 1 MG tablet Take 1 tablet (1 mg total) by mouth 2 (two) times daily in the am and at bedtime.. (Patient taking differently: Take 1 mg by mouth 3 (three) times daily. ) 60 tablet 0   . buPROPion (WELLBUTRIN XL) 150 MG 24 hr tablet Take 150 mg by mouth daily.      . CAPLYTA 42 MG CAPS Take 1 capsule by mouth daily.     Marland Kitchen doxycycline (VIBRAMYCIN) 100 MG capsule Take 1 capsule (100 mg total) by mouth 2 (two) times daily for 7 days. 14 capsule 0   . fluPHENAZine (PROLIXIN) 10 MG tablet Take 10 mg by mouth 2 (two) times a day.      . hydrOXYzine (ATARAX/VISTARIL) 50 MG tablet Take 50 mg by mouth 3 (three) times  daily as needed for anxiety.      . lamoTRIgine (LAMICTAL) 100 MG tablet Take 100-200 mg by mouth See admin instructions. Take 100 mg every morning and 200 mg every evening.     Marland Kitchen OLANZapine (ZYPREXA) 20 MG tablet Take 20 mg by mouth at bedtime.     . traZODone (DESYREL) 50 MG tablet Take 100 mg by mouth at bedtime.     . triamcinolone cream (KENALOG) 0.1 % Apply 1 application topically daily as needed (eczema).        Patient Stressors: Loss of room mate  Patient Strengths: Capable of independent living Communication skills General fund of knowledge  Treatment Modalities: Medication Management, Group therapy, Case management,  1 to 1 session with clinician, Psychoeducation, Recreational therapy.   Physician Treatment Plan for Primary Diagnosis: <principal problem not specified> Long Term Goal(s): Improvement in symptoms so as ready for discharge Improvement in symptoms so as ready for discharge   Short Term Goals: Compliance with prescribed medications will improve Ability to identify triggers associated with substance abuse/mental health issues will improve  Medication Management: Evaluate patient's response, side effects, and tolerance of medication regimen.  Therapeutic Interventions: 1 to 1 sessions, Unit Group sessions and Medication administration.  Evaluation of Outcomes: Progressing  Physician  Treatment Plan for Secondary Diagnosis: Active Problems:   Paranoid schizophrenia (HCC)  Long Term Goal(s): Improvement in symptoms so as ready for discharge Improvement in symptoms so as ready for discharge   Short Term Goals: Compliance with prescribed medications will improve Ability to identify triggers associated with substance abuse/mental health issues will improve     Medication Management: Evaluate patient's response, side effects, and tolerance of medication regimen.  Therapeutic Interventions: 1 to 1 sessions, Unit Group sessions and Medication  administration.  Evaluation of Outcomes: Progressing   RN Treatment Plan for Primary Diagnosis: <principal problem not specified> Long Term Goal(s): Knowledge of disease and therapeutic regimen to maintain health will improve  Short Term Goals: Ability to participate in decision making will improve, Ability to verbalize feelings will improve, Ability to disclose and discuss suicidal ideas, Ability to identify and develop effective coping behaviors will improve and Compliance with prescribed medications will improve  Medication Management: RN will administer medications as ordered by provider, will assess and evaluate patient's response and provide education to patient for prescribed medication. RN will report any adverse and/or side effects to prescribing provider.  Therapeutic Interventions: 1 on 1 counseling sessions, Psychoeducation, Medication administration, Evaluate responses to treatment, Monitor vital signs and CBGs as ordered, Perform/monitor CIWA, COWS, AIMS and Fall Risk screenings as ordered, Perform wound care treatments as ordered.  Evaluation of Outcomes: Progressing   LCSW Treatment Plan for Primary Diagnosis: <principal problem not specified> Long Term Goal(s): Safe transition to appropriate next level of care at discharge, Engage patient in therapeutic group addressing interpersonal concerns.  Short Term Goals: Engage patient in aftercare planning with referrals and resources and Increase skills for wellness and recovery  Therapeutic Interventions: Assess for all discharge needs, 1 to 1 time with Social worker, Explore available resources and support systems, Assess for adequacy in community support network, Educate family and significant other(s) on suicide prevention, Complete Psychosocial Assessment, Interpersonal group therapy.  Evaluation of Outcomes: Progressing   Progress in Treatment: Attending groups: Yes. Participating in groups: Yes. Taking medication as  prescribed: Yes. Toleration medication: Yes. Family/Significant other contact made: No, will contact:  pt's mother Patient understands diagnosis: Yes. Discussing patient identified problems/goals with staff: Yes. Medical problems stabilized or resolved: Yes. Denies suicidal/homicidal ideation: Yes. Issues/concerns per patient self-inventory: No. Other:   New problem(s) identified: No, Describe:  None  New Short Term/Long Term Goal(s): Medication stabilization, elimination of SI thoughts, and development of a comprehensive mental wellness plan.   Patient Goals:  "Get on medications and stop the voices"   Discharge Plan or Barriers: Patient will be discharged home and will continue with his ACTT team through [facility].   Reason for Continuation of Hospitalization: Depression Hallucinations Medication stabilization  Estimated Length of Stay: 2-3 days   Attendees: Patient: Majd Tissue 06/02/2019   Physician: Dr. Malvin Johns, MD  06/02/2019   Nursing:  06/02/2019   RN Care Manager: 06/02/2019   Social Worker: Stephannie Peters, LCSW  06/02/2019   Recreational Therapist:  06/02/2019   Other:  06/02/2019  Other:  06/02/2019   Other: 06/02/2019     Scribe for Treatment Team: Delphia Grates, LCSW 06/02/2019 2:00 PM

## 2019-06-03 DIAGNOSIS — Z23 Encounter for immunization: Secondary | ICD-10-CM | POA: Diagnosis not present

## 2019-06-03 MED ORDER — TOPIRAMATE 50 MG PO TABS: 50.0000 mg | ORAL_TABLET | Freq: Two times a day (BID) | ORAL | 2 refills | Status: DC

## 2019-06-03 MED ORDER — TRAZODONE HCL 50 MG PO TABS: 50.0000 mg | ORAL_TABLET | Freq: Every day | ORAL | 0 refills | Status: DC

## 2019-06-03 MED ORDER — BENZTROPINE MESYLATE 1 MG PO TABS: 1.0000 mg | ORAL_TABLET | Freq: Two times a day (BID) | ORAL | 2 refills | Status: DC

## 2019-06-03 MED ORDER — FLUPHENAZINE HCL 10 MG PO TABS: 20.0000 mg | ORAL_TABLET | Freq: Every day | ORAL | 2 refills | Status: DC

## 2019-06-03 NOTE — Progress Notes (Signed)
RN met with pt and reviewed pt's discharge instructions.  Pt verbalized understanding of discharge instructions and pt did not have any questions. RN returned pt's belongings  to pt.   Prescriptions were given to pt.  Pt denies SI/HI/AVH and voiced no concerns.  Pt was appreciative of the care pt received at [facility]. Patient discharged to the lobby without incident.

## 2019-06-03 NOTE — BHH Suicide Risk Assessment (Signed)
[facility] Discharge Suicide Risk Assessment   Principal Problem: Schizophrenic disorder Discharge Diagnoses: Active Problems:   Paranoid schizophrenia (HCC)   Total Time spent with patient: 45 minutes Musculoskeletal: Strength & Muscle Tone: within normal limits Gait & Station: normal Patient leans: N/A  Psychiatric Specialty Exam: Physical Exam  Review of Systems  Blood pressure 117/80, pulse 85, temperature 98 F (36.7 C), temperature source Oral, resp. rate 20, height 6\' 1"  (1.854 m), weight 119 kg, SpO2 99 %.Body mass index is 34.61 kg/m.  General Appearance: Casual  Eye Contact:  Good  Speech:  Clear and Coherent  Volume:  Decreased  Mood:  Euthymic  Affect:  Appropriate  Thought Process:  Coherent and Goal Directed  Orientation:  Full (Time, Place, and Person)  Thought Content:  Logical and Denies current auditory or visual hallucinations and tends to conflate internal ruminations and random thoughts into his previous expressed psychosis   Suicidal Thoughts:  No  Homicidal Thoughts:  No  Memory:  Immediate;   Fair Recent;   Fair Remote;   Fair  Judgement:  Fair  Insight:  Good  Psychomotor Activity:  Normal  Concentration:  Concentration: Fair  Recall:  of Knowledge:  Fair  Language:  Fair  Akathisia:  Negative  Handed:  Right  AIMS (if indicated):     Assets:  Communication Skills Desire for Improvement Housing Leisure Time Physical Health Resilience Social Support  ADL's:  Intact  Cognition:  WNL  Sleep:  Number of Hours: 7.25   Mental Status Per Nursing Assessment::   On Admission:  NA  Demographic Factors:  Male and Unemployed  Loss Factors: Decrease in vocational status  Historical Factors: NA  Risk Reduction Factors:   Sense of responsibility to family and Religious beliefs about death  Continued Clinical Symptoms:  Schizophrenia:   Less than 31 years old  Cognitive Features That Contribute To Risk:  None    Suicide Risk:   Minimal: No identifiable suicidal ideation.  Patients presenting with no risk factors but with morbid ruminations; may be classified as minimal risk based on the severity of the depressive symptoms  Follow-up Information    Services[facility] Follow up.   Why: ACTT Contact information: [address] [place] [phone]           Plan Of Care/Follow-up recommendations:  Activity:  full  Khori Rosevear,[place], MD 06/03/2019, 9:25 AM

## 2019-06-03 NOTE — Plan of Care (Signed)
Pt came to groups but did not participate even with encouragement from LRT.    Caroll Rancher, LRT/CTRS

## 2019-06-03 NOTE — Progress Notes (Signed)
Recreation Therapy Notes  Date: 2.4.21 Time: 1000 Location: 500 Hall Dayroom  Group Topic: Self-Esteem  Goal Area(s) Addresses:  Patient will successfully identify positive attributes about themselves.  Patient will successfully identify benefit of improved self-esteem.   Intervention: Blank masks, colored pencils, glue sticks, magazines and scissors  Activity: Art Masks.  Patients were to take a blank mask and create a mask that highlights some of the positive qualities and attributes they have.  Patients could also use the magazines to find pictures/words that explain positive qualities about them.    Education:  Self-Esteem, Building control surveyor.   Education Outcome: Acknowledges education/In group clarification offered/Needs additional education  Clinical Observations/Feedback: Pt did not attend group.    Caroll Rancher, LRT/CTRS         Lillia Abed, Camiyah Friberg A 06/03/2019 11:11 AM

## 2019-06-03 NOTE — Progress Notes (Addendum)
  [facility] Adult Case Management Discharge Plan :  Will you be returning to the same living situation after discharge:  Yes,  home At discharge, do you have transportation home?: Yes,  pt will get a ride  Do you have the ability to pay for your medications: Yes,  [employer]  Release of information consent forms completed and in the chart;  Patient's signature needed at discharge.  Patient to Follow up at: Follow-up Information    [facility]. Go on 06/03/2019.   Why: Your ACTT team will come out to see you today, Thursday, February 4th at 12pm.  Contact information: [address] [place] [address] [phone]           Next level of care provider has access to [facility] Link:no  Safety Planning and Suicide Prevention discussed: Yes,  with pt's mother      Has patient been referred to the Quitline?: Patient refused referral  Patient has been referred for addiction treatment: Yes  Reynold Bowen, Student-Social Work 06/03/2019, 10:06 AM

## 2019-06-03 NOTE — Progress Notes (Signed)
Recreation Therapy Notes  INPATIENT RECREATION TR PLAN  Patient Details Name: AMED DATTA MRN: [id number] DOB: [birth year] Today's Date: 06/03/2019  Rec Therapy Plan Is patient appropriate for Therapeutic Recreation?: Yes Treatment times per week: about 3 days Estimated Length of Stay: 5-7 days TR Treatment/Interventions: Group participation (Comment)  Discharge Criteria Pt will be discharged from therapy if:: Discharged Treatment plan/goals/alternatives discussed and agreed upon by:: Patient/family  Discharge Summary Short term goals set: See patient care plan Short term goals met: Not met Progress toward goals comments: Groups attended Which groups?: Leisure education, Anger management Reason goals not met: Pt did not participate in groups. Therapeutic equipment acquired: N/A Reason patient discharged from therapy: Discharge from hospital Pt/family agrees with progress & goals achieved: Yes Date patient discharged from therapy: 06/03/19    Victorino Sparrow, LRT/CTRS  Ria Comment, Hettie Roselli A 06/03/2019, 11:22 AM

## 2019-06-03 NOTE — [facility] Suicide Risk Assessment (Signed)
[facility] INPATIENT:  Family/Significant Other Suicide Prevention Education  Suicide Prevention Education:  Education Completed; [name] mother, [name],  has been identified by the patient as the family member/significant other with whom the patient will be residing, and identified as the person(s) who will aid the patient in the event of a mental health crisis (suicidal ideations/suicide attempt).  With written consent from the patient, the family member/significant other has been provided the following suicide prevention education, prior to the and/or following the discharge of the patient.  The suicide prevention education provided includes the following:  Suicide risk factors  Suicide prevention and interventions  National Suicide Hotline telephone number  [facility] assessment telephone number  [facility] Emergency Assistance 911  [facility] and/or Residential Mobile Crisis Unit telephone number  Request made of family/significant other to:  Remove weapons (e.g., guns, rifles, knives), all items previously/currently identified as safety concern.    Remove drugs/medications (over-the-counter, prescriptions, illicit drugs), all items previously/currently identified as a safety concern.  The family member/significant other verbalizes understanding of the suicide prevention education information provided.  The family member/significant other agrees to remove the items of safety concern listed above.   CSW contacted pt's mother, [name]. [name] mother stated that she has been in contact with the patient's ACTT team because they couldn't find him before he came into the hospital. [name] mother wanted to make sure that he had his medications updated and that ACTT team will want to make sure that they are updated.   Delphia Grates 06/03/2019, 10:01 AM

## 2019-06-03 NOTE — Discharge Summary (Signed)
Physician Discharge Summary Note  Patient:  [name] is an 44 y.o., male MRN:  [id number] DOB:  [birth year] Patient phone:  [phone] (home)  Patient address:   [address] [place] [address],  Total Time spent with patient: 45 minutes  Date of Admission:  05/31/2019 Date of Discharge: 06/03/2019  Reason for Admission:   This is the latest of numerous encounters with our healthcare system for [name], he is 1 and carries a diagnosis of a schizoaffective bipolar type condition.  He had presented to the emergency department on 1/28 complaining of a skin abscess in the groin area was prescribed doxycycline, he returned on 1/30 to the emergency department complaining of a ghost "talking to him" elaborating that the ghosts were going to "kill him in his sleep".  The patient is currently followed by an act team despite this intervention he has a history of noncompliance, and has a recent hospitalizations in 2020 at [facility].  The patient acknowledges he is suffering from auditory hallucinations but he did not elaborate the content to me.  He denied visual hallucinations.  He denies wanting to harm self or others, had expressed the delusional belief that his apartment was "haunted" and is hesitant to return there he denies this on my interview and acknowledges that this may be his "schizophrenia".  Drug screen negative for all compounds tested, the patient has been on long-acting injectable Abilify, he has been on lumateperone most recently in combination with Prolixin, he has been on olanzapine bupropion, oral aripiprazole, Depakote, lithium, Prolixin decanoate, according to the old chart.  He reports none of them have been particularly successful Associated Signs/Symptoms: Depression Symptoms:  insomnia, (Hypo) Manic Symptoms:  Delusions, Distractibility, Anxiety Symptoms:  Excessive Worry, Psychotic Symptoms:  Delusions, Hallucinations: Auditory Visual PTSD  Symptoms: Had a traumatic exposure:  Reports death of brother did not elaborate Principal Problem: exacerbation of psychotic disorder Discharge Diagnoses: Active Problems:   Paranoid schizophrenia (HCC)   Past Psychiatric History:  [facility] he presented with an unusual medication list and it was clear that these were older medications he was not actually on for antipsychotics but I was unable to reach the act team after couple of attempts to clarify his med list so we basically use the Prolixin as her main antipsychotic Past Medical History:  Past Medical History:  Diagnosis Date  . Bipolar disorder (HCC)   . Bronchitis   . Hyperlipidemia   . Paranoid schizophrenia (HCC)   . Schizophrenia [facility])     Past Surgical History:  Procedure Laterality Date  . TESTICLE SURGERY     Family History:  Family History  Problem Relation Age of Onset  . Schizophrenia Neg Hx    Family Psychiatric  History: No new data Social History:  Social History   Substance and Sexual Activity  Alcohol Use Yes   Comment: weekly     Social History   Substance and Sexual Activity  Drug Use Yes  . Types: Marijuana    Social History   Socioeconomic History  . Marital status: Single    Spouse name: Not on file  . Number of children: Not on file  . Years of education: Not on file  . Highest education level: Not on file  Occupational History  . Occupation: On disability  Tobacco Use  . Smoking status: Current Every Day Smoker    Packs/day: 1.00    Types: Cigarettes  .  Smokeless tobacco: Never Used  Substance and Sexual Activity  . Alcohol use: Yes    Comment: weekly  . Drug use: Yes    Types: Marijuana  . Sexual activity: Yes    Birth control/protection: Condom  Other Topics Concern  . Not on file  Social History Narrative   Pt lives alone in apartment complex for mentally ill   Social Determinants of Health   Financial Resource Strain:   .  Difficulty of Paying Living Expenses: Not on file  Food Insecurity:   . Worried About Programme researcher, broadcasting/film/video in the Last Year: Not on file  . Ran Out of Food in the Last Year: Not on file  Transportation Needs:   . Lack of Transportation (Medical): Not on file  . Lack of Transportation (Non-Medical): Not on file  Physical Activity:   . Days of Exercise per Week: Not on file  . Minutes of Exercise per Session: Not on file  Stress:   . Feeling of Stress : Not on file  Social Connections:   . Frequency of Communication with Friends and Family: Not on file  . Frequency of Social Gatherings with Friends and Family: Not on file  . Attends Religious Services: Not on file  . Active Member of Clubs or Organizations: Not on file  . Attends Banker Meetings: Not on file  . Marital Status: Not on file    Hospital Course:    [name] was admitted under routine precautions and never required a escalation in those precautions he was always cordial cooperative and compliant with medications.  He could not recall the specifics of his medication regimen and we felt that the home med list could not be accurate as far as for simultaneous antipsychotics as he was already on Abilify long-acting injectable so we focused on the Prolixin and escalated the dose to 20 mg at bedtime.  He reported that he believed he was in fact wanted because the voices were only present at his own home and at [facility] while he was here he reported he did not hear the conversations "like someone was next to him" but stated, by the fourth, that "sometimes it is like I am having conversations in my head" and he was reassured that this probably just reflects an internal dialogue rather than a psychotic process.  At any rate he displayed no danger behaviors felt he would always have some degree of psychosis, and had no EPS or TD.  No thoughts of harming self or others.  He felt he had achieved maximum benefit from inpatient care  and requested discharge.  As discussed below alert oriented no acute dangerousness calm cooperative so forth.    Musculoskeletal: Strength & Muscle Tone: within normal limits Gait & Station: normal Patient leans: N/A  Psychiatric Specialty Exam: Physical Exam  Review of Systems  Blood pressure 117/80, pulse 85, temperature 98 F (36.7 C), temperature source Oral, resp. rate 20, height 6\' 1"  (1.854 m), weight 119 kg, SpO2 99 %.Body mass index is 34.61 kg/m.  General Appearance: Casual  Eye Contact:  Good  Speech:  Clear and Coherent  Volume:  Decreased  Mood:  Euthymic  Affect:  Appropriate  Thought Process:  Coherent and Goal Directed  Orientation:  Full (Time, Place, and Person)  Thought Content:  Logical and Denies current auditory or visual hallucinations and tends to conflate internal ruminations and random thoughts into his previous expressed psychosis   Suicidal Thoughts:  No  Homicidal  Thoughts:  No  Memory:  Immediate;   Fair Recent;   Fair Remote;   Fair  Judgement:  Fair  Insight:  Good  Psychomotor Activity:  Normal  Concentration:  Concentration: Fair  Recall:  Fair  Fund of Knowledge:  Fair  Language:  Fair  Akathisia:  Negative  Handed:  Right  AIMS (if indicated):     Assets:  Communication Skills Desire for Improvement Housing Leisure Time Physical Health Resilience Social Support  ADL's:  Intact  Cognition:  WNL  Sleep:  Number of Hours: 7.25        Has this patient used any form of tobacco in the last 30 days? (Cigarettes, Smokeless Tobacco, Cigars, and/or Pipes) Yes, No  Blood Alcohol level:  Lab Results  Component Value Date   ETH <10 05/29/2019   ETH <10 11/28/2018    Metabolic Disorder Labs:  Lab Results  Component Value Date   HGBA1C 5.2 06/01/2019   MPG 102.54 06/01/2019   Lab Results  Component Value Date   PROLACTIN 8.1 06/01/2019   Lab Results  Component Value Date   CHOL 216 (H) 06/01/2019   TRIG 217 (H) 06/01/2019    HDL 35 (L) 06/01/2019   CHOLHDL 6.2 06/01/2019   VLDL 43 (H) 06/01/2019   LDLCALC 138 (H) 06/01/2019    See Psychiatric Specialty Exam and Suicide Risk Assessment completed by Attending Physician prior to discharge.  Discharge destination:  Home  Is patient on multiple antipsychotic therapies at discharge:  No   Has Patient had three or more failed trials of antipsychotic monotherapy by history:  No  Recommended Plan for Multiple Antipsychotic Therapies: NA   Allergies as of 06/03/2019      Reactions   Other    Some trial medicine from [facility].       Medication List    STOP taking these medications   Caplyta 42 MG Caps Generic drug: Lumateperone Tosylate   lamoTRIgine 100 MG tablet Commonly known as: LAMICTAL   OLANZapine 20 MG tablet Commonly known as: ZYPREXA     TAKE these medications     Indication  Abilify Maintena 400 MG Prsy prefilled syringe Generic drug: ARIPiprazole ER Inject 400 mg into the muscle every 28 (twenty-eight) days.  Indication: Schizophrenia   benztropine 1 MG tablet Commonly known as: COGENTIN Take 1 tablet (1 mg total) by mouth 2 (two) times daily. What changed: when to take this  Indication: Extrapyramidal Reaction caused by Medications   buPROPion 150 MG 24 hr tablet Commonly known as: WELLBUTRIN XL Take 150 mg by mouth daily.  Indication: Major Depressive Disorder   doxycycline 100 MG capsule Commonly known as: VIBRAMYCIN Take 1 capsule (100 mg total) by mouth 2 (two) times daily for 7 days.  Indication: Common Acne   fluPHENAZine 10 MG tablet Commonly known as: PROLIXIN Take 2 tablets (20 mg total) by mouth at bedtime. What changed:   how much to take  when to take this  Indication: Psychosis   hydrOXYzine 50 MG tablet Commonly known as: ATARAX/VISTARIL Take 50 mg by mouth 3 (three) times daily as needed for anxiety.  Indication: Feeling Anxious   topiramate 50 MG tablet Commonly known as: Topamax Take 1 tablet  (50 mg total) by mouth 2 (two) times daily.  Indication: Antipsychotic Therapy-Induced Weight Gain   traZODone 50 MG tablet Commonly known as: DESYREL Take 1 tablet (50 mg total) by mouth at bedtime. What changed: how much to take  Indication: Trouble Sleeping  triamcinolone cream 0.1 % Commonly known as: KENALOG Apply 1 application topically daily as needed (eczema).  Indication: Allergic Contact Dermatitis      Follow-up Information    [facility] Follow up.   WhyVicki Mallet information: [place] [address] [phone]          Signed: Johnn Hai, MD 06/03/2019, 9:20 AM

## 2019-07-31 ENCOUNTER — Emergency Department (HOSPITAL_COMMUNITY)
Admission: EM | Admit: 2019-07-31 | Discharge: 2019-07-31 | Disposition: A | Discharge status: Home / Self Care | Payer: Medicare Other | Attending: Emergency Medicine | Admitting: Emergency Medicine

## 2019-07-31 ENCOUNTER — Other Ambulatory Visit: Payer: Self-pay

## 2019-07-31 ENCOUNTER — Encounter (HOSPITAL_COMMUNITY): Payer: Self-pay

## 2019-07-31 DIAGNOSIS — L299 Pruritus, unspecified: Secondary | ICD-10-CM | POA: Insufficient documentation

## 2019-07-31 DIAGNOSIS — Z79899 Other long term (current) drug therapy: Secondary | ICD-10-CM | POA: Insufficient documentation

## 2019-07-31 DIAGNOSIS — F17210 Nicotine dependence, cigarettes, uncomplicated: Secondary | ICD-10-CM | POA: Insufficient documentation

## 2019-07-31 MED ORDER — DIPHENHYDRAMINE HCL 25 MG PO TABS: 25.0000 mg | ORAL_TABLET | Freq: Four times a day (QID) | ORAL | 0 refills | Status: DC | PRN

## 2019-07-31 MED ORDER — DIPHENHYDRAMINE HCL 25 MG PO CAPS
25.0000 mg | ORAL_CAPSULE | Freq: Once | ORAL | Status: AC
  Administered 2019-07-31: 25 mg via ORAL
  Filled 2019-07-31: qty 1

## 2019-07-31 NOTE — ED Provider Notes (Signed)
[facility] EMERGENCY DEPARTMENT Provider Note   CSN: [id number] Arrival date & time: 07/31/19  2158     History Chief Complaint  Patient presents with  . Pruritis    [name] is a 44 y.o. male with a history of paranoid schizophrenia presenting for evaluation of pruritus.  He states he was at a local [employer] and ate a bowl of macaroni and cheese, then when he got in his car he developed allover itching, denies rash, denies shortness of breath, mouth tongue or throat swelling.  Also has no nausea, vomiting or abdominal pain.  Denies chest pain.  Since arriving here his symptoms have almost resolved, stating he still feels a little itchy on the tops of his feet only.  He has consumed mac & cheese from this restaurant before without any similar symptoms.  He denies any new exposures to foods, chemicals, soaps, lotions, no new medications.  He has had no treatment prior to arrival.  The history is provided by the patient.       Past Medical History:  Diagnosis Date  . Bipolar disorder ([place])   . Bronchitis   . Hyperlipidemia   . Paranoid schizophrenia ([place])   . Schizophrenia [facility])     Patient Active Problem List   Diagnosis Date Noted  . Paranoid schizophrenia ([place]) 05/31/2019  . Tobacco use disorder   . Schizoaffective disorder, bipolar type ([place]) 06/29/2014    Past Surgical History:  Procedure Laterality Date  . TESTICLE SURGERY         Family History  Problem Relation Age of Onset  . Schizophrenia Neg Hx     Social History   Tobacco Use  . Smoking status: Current Every Day Smoker    Packs/day: 1.00    Types: Cigarettes  . Smokeless tobacco: Never Used  Substance Use Topics  . Alcohol use: Yes    Comment: weekly  . Drug use: Yes    Types: Marijuana    Home Medications Prior to Admission medications   Medication Sig Start Date End Date Taking? Authorizing Provider  ABILIFY MAINTENA 400 MG PRSY prefilled syringe Inject 400 mg into the  muscle every 28 (twenty-eight) days.  03/11/18   [provider]  benztropine (COGENTIN) 1 MG tablet Take 1 tablet (1 mg total) by mouth 2 (two) times daily. 06/03/19 06/02/20  Johnn Hai, MD  buPROPion (WELLBUTRIN XL) 150 MG 24 hr tablet Take 150 mg by mouth daily.     [provider]  diphenhydrAMINE (BENADRYL) 25 MG tablet Take 1 tablet (25 mg total) by mouth every 6 (six) hours as needed for itching. 07/31/19   Evalee Jefferson, PA-C  fluPHENAZine (PROLIXIN) 10 MG tablet Take 2 tablets (20 mg total) by mouth at bedtime. 06/03/19   Johnn Hai, MD  hydrOXYzine (ATARAX/VISTARIL) 50 MG tablet Take 50 mg by mouth 3 (three) times daily as needed for anxiety.     [provider]  topiramate (TOPAMAX) 50 MG tablet Take 1 tablet (50 mg total) by mouth 2 (two) times daily. 06/03/19 06/02/20  Johnn Hai, MD  traZODone (DESYREL) 50 MG tablet Take 1 tablet (50 mg total) by mouth at bedtime. 06/03/19   Johnn Hai, MD  triamcinolone cream (KENALOG) 0.1 % Apply 1 application topically daily as needed (eczema).  10/13/18   [provider]    Allergies    Other  Review of Systems   Review of Systems  Constitutional: Negative for chills and fever.  HENT: Negative  for congestion, facial swelling, sore throat and trouble swallowing.   Eyes: Negative.   Respiratory: Negative for cough, chest tightness, shortness of breath, wheezing and stridor.   Cardiovascular: Negative for chest pain.  Gastrointestinal: Negative for abdominal pain and nausea.  Genitourinary: Negative.   Musculoskeletal: Negative for arthralgias, joint swelling and neck pain.  Skin: Negative.  Negative for rash and wound.  Allergic/Immunologic: Negative for environmental allergies and food allergies.  Neurological: Negative for dizziness, weakness, light-headedness, numbness and headaches.  Psychiatric/Behavioral: Negative.     Physical Exam Updated Vital Signs BP (!) 137/91 (BP Location: Right Arm)   Pulse 86    Temp 98 F (36.7 C) (Oral)   Resp 18   Ht _0  (1.854 m)   Wt 122 kg   SpO2 100%   BMI 35.49 kg/m   Physical Exam Vitals and nursing note reviewed.  Constitutional:      Appearance: He is well-developed.  HENT:     Head: Normocephalic and atraumatic.     Mouth/Throat:     Mouth: Mucous membranes are moist.     Pharynx: Oropharynx is clear. No posterior oropharyngeal erythema.     Comments: No tongue, mouth or posterior pharynx edema. Eyes:     Conjunctiva/sclera: Conjunctivae normal.  Cardiovascular:     Rate and Rhythm: Normal rate and regular rhythm.     Heart sounds: Normal heart sounds.  Pulmonary:     Effort: Pulmonary effort is normal.     Breath sounds: Normal breath sounds. No stridor. No wheezing.  Abdominal:     General: Bowel sounds are normal.     Palpations: Abdomen is soft.     Tenderness: There is no abdominal tenderness.  Musculoskeletal:        General: Normal range of motion.     Cervical back: Normal range of motion.  Skin:    General: Skin is warm and dry.     Findings: No erythema or rash.  Neurological:     General: No focal deficit present.     Mental Status: He is alert and oriented to person, place, and time.     ED Results / Procedures / Treatments   Labs (all labs ordered are listed, but only abnormal results are displayed) Labs Reviewed - No data to display  EKG None  Radiology No results found.  Procedures Procedures (including critical care time)  Medications Ordered in ED Medications  diphenhydrAMINE (BENADRYL) capsule 25 mg (25 mg Oral Given 07/31/19 2332)    ED Course  I have reviewed the triage vital signs and the nursing notes.  Pertinent labs & imaging results that were available during my care of the patient were reviewed by me and considered in my medical decision making (see chart for details).    MDM Rules/Calculators/A&P                      Patient with a transient episode of pruritus which is nearly  resolved of unclear etiology.  He has no rash or hives present.  Also no edema of mouth tongue or throat, no respiratory distress.  He was given a Benadryl tablet here, encouraged to continue Benadryl if his symptoms persist, recheck by his PCP or here for any return or worsening of symptoms.  The patient appears reasonably screened and/or stabilized for discharge and I doubt any other medical condition or other [facility] requiring further screening, evaluation, or treatment in the ED at this time prior to discharge.  Final Clinical Impression(s) / ED Diagnoses Final diagnoses:  Pruritus    Rx / DC Orders ED Discharge Orders         Ordered    diphenhydrAMINE (BENADRYL) 25 MG tablet  Every 6 hours PRN     07/31/19 2330           Evalee Jefferson, PA-C 08/02/19 0201    Rolland Porter, MD 08/02/19 [phone]

## 2019-07-31 NOTE — Discharge Instructions (Addendum)
It is unclear what was the source of your itching tonight.  Your exam is reassuring.  You may continue to take the benadryl tomorrow if you continue to have symptoms.

## 2019-07-31 NOTE — ED Triage Notes (Signed)
Pt comes in after eating mac-n-cheese. Says he is itching all over

## 2019-08-09 DIAGNOSIS — E785 Hyperlipidemia, unspecified: Secondary | ICD-10-CM | POA: Diagnosis not present

## 2019-08-09 DIAGNOSIS — R21 Rash and other nonspecific skin eruption: Secondary | ICD-10-CM | POA: Diagnosis not present

## 2019-08-10 ENCOUNTER — Emergency Department (HOSPITAL_COMMUNITY)
Admission: EM | Admit: 2019-08-10 | Discharge: 2019-08-10 | Disposition: A | Discharge status: Home / Self Care | Payer: Medicare Other | Attending: Emergency Medicine | Admitting: Emergency Medicine

## 2019-08-10 ENCOUNTER — Other Ambulatory Visit: Payer: Self-pay

## 2019-08-10 ENCOUNTER — Encounter (HOSPITAL_COMMUNITY): Payer: Self-pay | Admitting: Emergency Medicine

## 2019-08-10 DIAGNOSIS — L905 Scar conditions and fibrosis of skin: Secondary | ICD-10-CM | POA: Diagnosis not present

## 2019-08-10 DIAGNOSIS — F17210 Nicotine dependence, cigarettes, uncomplicated: Secondary | ICD-10-CM | POA: Insufficient documentation

## 2019-08-10 DIAGNOSIS — L989 Disorder of the skin and subcutaneous tissue, unspecified: Secondary | ICD-10-CM | POA: Diagnosis present

## 2019-08-10 DIAGNOSIS — Z79899 Other long term (current) drug therapy: Secondary | ICD-10-CM | POA: Diagnosis not present

## 2019-08-10 NOTE — ED Provider Notes (Signed)
[facility] EMERGENCY DEPARTMENT Provider Note   CSN: [id number] Arrival date & time: 08/10/19  2694     History No chief complaint on file.   [name] is a 44 y.o. male.  HPI     This a 45 year old male with history of paranoid schizophrenia who presents with concerns for bites on his legs.  Patient reports that he received a bite on his leg at [facility] many years ago.  He states that he feels like it is "spreading."  He has noted several spots on his legs.  They are anywhere from 6 months to 66 year old.  He has not noted any itching.  He questions whether it may be a spider bite.  He denies any pain but states "they are irritating."  Past Medical History:  Diagnosis Date  . Bipolar disorder (HCC)   . Bronchitis   . Hyperlipidemia   . Paranoid schizophrenia (HCC)   . Schizophrenia [facility])     Patient Active Problem List   Diagnosis Date Noted  . Paranoid schizophrenia (HCC) 05/31/2019  . Tobacco use disorder   . Schizoaffective disorder, bipolar type (HCC) 06/29/2014    Past Surgical History:  Procedure Laterality Date  . TESTICLE SURGERY         Family History  Problem Relation Age of Onset  . Schizophrenia Neg Hx     Social History   Tobacco Use  . Smoking status: Current Every Day Smoker    Packs/day: 1.00    Types: Cigarettes  . Smokeless tobacco: Never Used  Substance Use Topics  . Alcohol use: Yes    Comment: weekly  . Drug use: Yes    Types: Marijuana    Home Medications Prior to Admission medications   Medication Sig Start Date End Date Taking? Authorizing Provider  ABILIFY MAINTENA 400 MG PRSY prefilled syringe Inject 400 mg into the muscle every 28 (twenty-eight) days.  03/11/18   [provider]  benztropine (COGENTIN) 1 MG tablet Take 1 tablet (1 mg total) by mouth 2 (two) times daily. 06/03/19 06/02/20  Malvin Johns, MD  buPROPion (WELLBUTRIN XL) 150 MG 24 hr tablet Take 150 mg by mouth daily.     [provider]  diphenhydrAMINE (BENADRYL) 25 MG tablet Take 1 tablet (25 mg total) by mouth every 6 (six) hours as needed for itching. 07/31/19   Burgess Amor, PA-C  fluPHENAZine (PROLIXIN) 10 MG tablet Take 2 tablets (20 mg total) by mouth at bedtime. 06/03/19   Malvin Johns, MD  hydrOXYzine (ATARAX/VISTARIL) 50 MG tablet Take 50 mg by mouth 3 (three) times daily as needed for anxiety.     [provider]  topiramate (TOPAMAX) 50 MG tablet Take 1 tablet (50 mg total) by mouth 2 (two) times daily. 06/03/19 06/02/20  Malvin Johns, MD  traZODone (DESYREL) 50 MG tablet Take 1 tablet (50 mg total) by mouth at bedtime. 06/03/19   Malvin Johns, MD  triamcinolone cream (KENALOG) 0.1 % Apply 1 application topically daily as needed (eczema).  10/13/18   [provider]    Allergies    Other  Review of Systems   Review of Systems  Constitutional: Negative for fever.  Skin: Positive for wound.  All other systems reviewed and are negative.   Physical Exam Updated Vital Signs BP (!) 167/99   Pulse 71   Temp 98.1 F (36.7 C)   Resp 18   Ht 1.854 m (6\' 1" )   Wt 122 kg  SpO2 100%   BMI 35.49 kg/m   Physical Exam Vitals and nursing note reviewed.  Constitutional:      Appearance: He is well-developed. He is not ill-appearing.  HENT:     Head: Normocephalic and atraumatic.     Mouth/Throat:     Mouth: Mucous membranes are moist.  Eyes:     Pupils: Pupils are equal, round, and reactive to light.  Cardiovascular:     Rate and Rhythm: Normal rate and regular rhythm.  Pulmonary:     Effort: Pulmonary effort is normal. No respiratory distress.  Musculoskeletal:        General: No tenderness.     Cervical back: Neck supple.     Right lower leg: No edema.     Left lower leg: No edema.  Lymphadenopathy:     Cervical: No cervical adenopathy.  Skin:    General: Skin is warm and dry.     Comments: Patient was several subcentimeter hyperpigmented areas on the bilateral legs  consistent with scar, no adjacent erythema, no pustules  Neurological:     Mental Status: He is alert and oriented to person, place, and time.  Psychiatric:     Comments: Bizarre affect     ED Results / Procedures / Treatments   Labs (all labs ordered are listed, but only abnormal results are displayed) Labs Reviewed - No data to display  EKG None  Radiology No results found.  Procedures Procedures (including critical care time)  Medications Ordered in ED Medications - No data to display  ED Course  I have reviewed the triage vital signs and the nursing notes.  Pertinent labs & imaging results that were available during my care of the patient were reviewed by me and considered in my medical decision making (see chart for details).    MDM Rules/Calculators/A&P                       Patient presents with concerns for bites on his legs.  They have been present for a significant amount of time.  Exam is more consistent with scarring.  No obvious cellulitis or acute bite.  Patient reassured.  He is quite bizarre with his affect and question whether he may have some delusion.  He does have a history of paranoid schizophrenia.  He is nontoxic-appearing however and does not appear to be a threat to himself.  Will discharge with reassurance.  After history, exam, and medical workup I feel the patient has been appropriately medically screened and is safe for discharge home. Pertinent diagnoses were discussed with the patient. Patient was given return precautions.   Final Clinical Impression(s) / ED Diagnoses Final diagnoses:  Scarring    Rx / DC Orders ED Discharge Orders    None       Iyesha Such, Barbette Hair, MD 08/10/19 [phone]

## 2019-08-10 NOTE — ED Triage Notes (Addendum)
Pt c/o bug bits on his lower legs that he received from [place] several years ago is now spreading. Pt also states that he killed several black spiders lately and the spots may be from them.

## 2019-08-10 NOTE — Discharge Instructions (Addendum)
You have what appears to be scars on your legs.  These are not consistent with insect bites.

## 2019-08-15 ENCOUNTER — Encounter (HOSPITAL_COMMUNITY): Payer: Self-pay | Admitting: Emergency Medicine

## 2019-08-15 ENCOUNTER — Emergency Department (HOSPITAL_COMMUNITY)
Admission: EM | Admit: 2019-08-15 | Discharge: 2019-08-15 | Disposition: A | Discharge status: Home / Self Care | Payer: Medicare Other | Attending: Emergency Medicine | Admitting: Emergency Medicine

## 2019-08-15 ENCOUNTER — Other Ambulatory Visit: Payer: Self-pay

## 2019-08-15 DIAGNOSIS — Z79899 Other long term (current) drug therapy: Secondary | ICD-10-CM | POA: Insufficient documentation

## 2019-08-15 DIAGNOSIS — F17210 Nicotine dependence, cigarettes, uncomplicated: Secondary | ICD-10-CM | POA: Insufficient documentation

## 2019-08-15 DIAGNOSIS — R209 Unspecified disturbances of skin sensation: Secondary | ICD-10-CM | POA: Diagnosis not present

## 2019-08-15 DIAGNOSIS — R202 Paresthesia of skin: Secondary | ICD-10-CM | POA: Diagnosis not present

## 2019-08-15 MED ORDER — ALBENDAZOLE 200 MG PO TABS: 400.0000 mg | ORAL_TABLET | Freq: Once | ORAL | 0 refills | Status: AC

## 2019-08-15 NOTE — ED Provider Notes (Signed)
[facility] EMERGENCY DEPARTMENT Provider Note   CSN: [id number] Arrival date & time: 08/15/19  1916     History Chief Complaint  Patient presents with  . ? tape worms    [name] is a 44 y.o. male.  HPI   Patient is a 44 year old male with a history of bipolar disorder, bronchitis, hyperlipidemia, paranoid schizophrenia,'s who presents to the emergency department today for evaluation of possible tapeworm.  Patient states that he has had an abnormal feeling to his anus for several days.  He states he feels like something is coming out of his buttocks.  He denies any itching.  He denies any diarrhea today.  Denies any abdominal pain, nausea, vomiting, fevers, or other systemic symptoms.  He denies any exposures to anyone is at tapeworm.  He does note he has a history of paranoid schizophrenia.  He denies any current SI, HI.  He does report chronic auditory hallucinations.  Denies visual hallucinations.  States he has been taking his prescribed medications at this time.  Past Medical History:  Diagnosis Date  . Bipolar disorder ([place])   . Bronchitis   . Hyperlipidemia   . Paranoid schizophrenia ([place])   . Schizophrenia [facility])     Patient Active Problem List   Diagnosis Date Noted  . Paranoid schizophrenia ([place]) 05/31/2019  . Tobacco use disorder   . Schizoaffective disorder, bipolar type ([place]) 06/29/2014    Past Surgical History:  Procedure Laterality Date  . TESTICLE SURGERY         Family History  Problem Relation Age of Onset  . Schizophrenia Neg Hx     Social History   Tobacco Use  . Smoking status: Current Every Day Smoker    Packs/day: 1.00    Types: Cigarettes  . Smokeless tobacco: Never Used  Substance Use Topics  . Alcohol use: Yes    Comment: weekly  . Drug use: Yes    Types: Marijuana    Home Medications Prior to Admission medications   Medication Sig Start Date End Date Taking? Authorizing Provider  ABILIFY MAINTENA 400 MG PRSY prefilled  syringe Inject 400 mg into the muscle every 28 (twenty-eight) days.  03/11/18   [provider]  albendazole (ALBENZA) 200 MG tablet Take 2 tablets (400 mg total) by mouth once for 1 dose. 08/15/19 08/15/19  Katelind Pytel S, PA-C  benztropine (COGENTIN) 1 MG tablet Take 1 tablet (1 mg total) by mouth 2 (two) times daily. 06/03/19 06/02/20  Johnn Hai, MD  buPROPion (WELLBUTRIN XL) 150 MG 24 hr tablet Take 150 mg by mouth daily.     [provider]  diphenhydrAMINE (BENADRYL) 25 MG tablet Take 1 tablet (25 mg total) by mouth every 6 (six) hours as needed for itching. 07/31/19   Evalee Jefferson, PA-C  fluPHENAZine (PROLIXIN) 10 MG tablet Take 2 tablets (20 mg total) by mouth at bedtime. 06/03/19   Johnn Hai, MD  hydrOXYzine (ATARAX/VISTARIL) 50 MG tablet Take 50 mg by mouth 3 (three) times daily as needed for anxiety.     [provider]  topiramate (TOPAMAX) 50 MG tablet Take 1 tablet (50 mg total) by mouth 2 (two) times daily. 06/03/19 06/02/20  Johnn Hai, MD  traZODone (DESYREL) 50 MG tablet Take 1 tablet (50 mg total) by mouth at bedtime. 06/03/19   Johnn Hai, MD  triamcinolone cream (KENALOG) 0.1 % Apply 1 application topically daily as needed (eczema).  10/13/18   [provider]    Allergies  Other  Review of Systems   Review of Systems  Constitutional: Negative for fever.  HENT: Negative for ear pain and sore throat.   Eyes: Negative for visual disturbance.  Respiratory: Negative for cough and shortness of breath.   Cardiovascular: Negative for chest pain.  Gastrointestinal: Positive for diarrhea (resolved). Negative for abdominal pain, constipation, nausea and vomiting.       Abnormal sensation to rectum  Genitourinary: Negative for dysuria and hematuria.  Musculoskeletal: Negative for back pain.  Skin: Negative for rash.  Neurological: Negative for headaches.  All other systems reviewed and are negative.   Physical Exam Updated Vital Signs BP (!)  155/88 (BP Location: Right Arm)   Pulse 84   Temp 98.9 F (37.2 C) (Oral)   Resp 18   Ht 6\' 1"  (1.854 m)   Wt 122 kg   SpO2 98%   BMI 35.49 kg/m   Physical Exam Constitutional:      General: He is not in acute distress.    Appearance: He is well-developed.  Eyes:     Conjunctiva/sclera: Conjunctivae normal.  Cardiovascular:     Rate and Rhythm: Normal rate and regular rhythm.  Pulmonary:     Effort: Pulmonary effort is normal.     Breath sounds: Normal breath sounds.  Genitourinary:    Comments: Chaperone presenting. Normal appearance of the anus. No external hemorrhoids present. Small amount of light brown stool noted adjacent to the anus. No anal fissures or other abnormalities. Skin:    General: Skin is warm and dry.  Neurological:     Mental Status: He is alert and oriented to person, place, and time.  Psychiatric:     Comments: No SI or HI. Chronic auditory hallucinations, unchanged     ED Results / Procedures / Treatments   Labs (all labs ordered are listed, but only abnormal results are displayed) Labs Reviewed - No data to display  EKG None  Radiology No results found.  Procedures Procedures (including critical care time)  Medications Ordered in ED Medications - No data to display  ED Course  I have reviewed the triage vital signs and the nursing notes.  Pertinent labs & imaging results that were available during my care of the patient were reviewed by me and considered in my medical decision making (see chart for details).    MDM Rules/Calculators/A&P                      44 year old male presenting for evaluation of an abnormal sensation to his anus for a few days.  He is concerned he may have tape worm.  On exam I do not see any acute abnormalities to the anus.  He denies any exposure to anyone with known tapeworms.  He denies any anal itching.  Due to his concerns, will give Rx for albendazole, however I do question whether or not this concern  is related to his paranoid schizophrenia.  He does not appear to be acutely psychiatrically decompensated at this time and I do not think he needs any emergent psychiatric evaluation.  Advised him to follow-up with his PCP and return to the ED for new or worsening symptoms.  He voiced understanding of plan and reasons to return.  All questions answered.  Patient stable for discharge.  Final Clinical Impression(s) / ED Diagnoses Final diagnoses:  Abnormal sensation of buttocks    Rx / DC Orders ED Discharge Orders         Ordered  albendazole (ALBENZA) 200 MG tablet   Once     08/15/19 2140           Rayne Du 08/15/19 2140    Terald Sleeper, MD 08/16/19 1216

## 2019-08-15 NOTE — Discharge Instructions (Signed)
Take albendazole as directed.   Please follow up with your primary care provider within 5-7 days for re-evaluation of your symptoms. If you do not have a primary care provider, information for a healthcare clinic has been provided for you to make arrangements for follow up care. Please return to the emergency department for any new or worsening symptoms.

## 2019-08-15 NOTE — ED Triage Notes (Signed)
Pt say his PCP 3 weeks ago   Is also followed by [name]

## 2019-08-15 NOTE — ED Triage Notes (Signed)
Pt with hx of mental illness   Third visit this month   Believes he has tape worms tonight  Feels "like something's hanging out" after "my buddy told me his sister had one when she was little from playing in a sandbox"  Reports that he has thought this for about a week

## 2019-09-03 DIAGNOSIS — Z79899 Other long term (current) drug therapy: Secondary | ICD-10-CM | POA: Diagnosis not present

## 2019-09-03 DIAGNOSIS — L309 Dermatitis, unspecified: Secondary | ICD-10-CM | POA: Diagnosis not present

## 2019-09-03 DIAGNOSIS — R609 Edema, unspecified: Secondary | ICD-10-CM | POA: Diagnosis not present

## 2019-09-12 ENCOUNTER — Emergency Department (HOSPITAL_COMMUNITY)
Admission: EM | Admit: 2019-09-12 | Discharge: 2019-09-12 | Disposition: A | Discharge status: Home / Self Care | Payer: Medicare Other | Attending: Emergency Medicine | Admitting: Emergency Medicine

## 2019-09-12 ENCOUNTER — Other Ambulatory Visit: Payer: Self-pay

## 2019-09-12 ENCOUNTER — Encounter (HOSPITAL_COMMUNITY): Payer: Self-pay

## 2019-09-12 DIAGNOSIS — Z79899 Other long term (current) drug therapy: Secondary | ICD-10-CM | POA: Insufficient documentation

## 2019-09-12 DIAGNOSIS — R2243 Localized swelling, mass and lump, lower limb, bilateral: Secondary | ICD-10-CM | POA: Diagnosis not present

## 2019-09-12 DIAGNOSIS — R600 Localized edema: Secondary | ICD-10-CM

## 2019-09-12 DIAGNOSIS — R21 Rash and other nonspecific skin eruption: Secondary | ICD-10-CM | POA: Insufficient documentation

## 2019-09-12 DIAGNOSIS — F17210 Nicotine dependence, cigarettes, uncomplicated: Secondary | ICD-10-CM | POA: Diagnosis not present

## 2019-09-12 LAB — CBC WITH DIFFERENTIAL/PLATELET
Abs Immature Granulocytes: 0.05 10*3/uL (ref 0.00–0.07)
Basophils Absolute: 0.1 10*3/uL (ref 0.0–0.1)
Basophils Relative: 1 %
Eosinophils Absolute: 0.3 10*3/uL (ref 0.0–0.5)
Eosinophils Relative: 3 %
HCT: 41.4 % (ref 39.0–52.0)
Hemoglobin: 14.2 g/dL (ref 13.0–17.0)
Immature Granulocytes: 1 %
Lymphocytes Relative: 29 %
Lymphs Abs: 2.4 10*3/uL (ref 0.7–4.0)
MCH: 30.5 pg (ref 26.0–34.0)
MCHC: 34.3 g/dL (ref 30.0–36.0)
MCV: 89 fL (ref 80.0–100.0)
Monocytes Absolute: 0.7 10*3/uL (ref 0.1–1.0)
Monocytes Relative: 8 %
Neutro Abs: 5 10*3/uL (ref 1.7–7.7)
Neutrophils Relative %: 58 %
Platelets: 289 10*3/uL (ref 150–400)
RBC: 4.65 MIL/uL (ref 4.22–5.81)
RDW: 12.4 % (ref 11.5–15.5)
WBC: 8.5 10*3/uL (ref 4.0–10.5)
nRBC: 0 % (ref 0.0–0.2)

## 2019-09-12 LAB — BASIC METABOLIC PANEL
Anion gap: 9 (ref 5–15)
BUN: 9 mg/dL (ref 6–20)
CO2: 25 mmol/L (ref 22–32)
Calcium: 8.9 mg/dL (ref 8.9–10.3)
Chloride: 104 mmol/L (ref 98–111)
Creatinine, Ser: 0.97 mg/dL (ref 0.61–1.24)
GFR calc Af Amer: >60 mL/min (ref >60)
GFR calc non Af Amer: >60 mL/min (ref >60)
Glucose, Bld: 118 mg/dL — ABNORMAL HIGH (ref 70–99)
Potassium: 3.8 mmol/L (ref 3.5–5.1)
Sodium: 138 mmol/L (ref 135–145)

## 2019-09-12 MED ORDER — CEPHALEXIN 500 MG PO CAPS: 500.0000 mg | ORAL_CAPSULE | Freq: Two times a day (BID) | ORAL | 0 refills | Status: AC

## 2019-09-12 MED ORDER — ENOXAPARIN SODIUM 40 MG/0.4ML ~~LOC~~ SOLN
40.0000 mg | Freq: Two times a day (BID) | SUBCUTANEOUS | Status: DC
  Administered 2019-09-12: 40 mg via SUBCUTANEOUS
  Filled 2019-09-12: qty 0.4

## 2019-09-12 NOTE — ED Triage Notes (Signed)
Pt states he was bit by a black spider 2 days ago. Right lower leg red and swollen.

## 2019-09-12 NOTE — Discharge Instructions (Addendum)
Your lab work today was reassuring.  We will set you up for an ultrasound of the lower extremities to rule out blood clot.  This will be completed tomorrow.  If the ultrasound is negative for blood clots then start taking the antibiotic as prescribed, but only if the ultrasound is negative for blood clots.  Please take all of your antibiotics until finished!   Take your antibiotics with food.  Common side effects of antibiotics include nausea, vomiting, abdominal discomfort, and diarrhea. You may help offset some of this with probiotics which you can buy or get in yogurt. Do not eat  or take the probiotics until 2 hours after your antibiotic.    Some studies suggest that certain antibiotics can reduce the efficacy of certain oral contraceptive pills (birth control), so please use additional contraceptives (condoms or other barrier method) while you are taking the antibiotics and for an additional 5 to 7 days afterwards if you are a male on these medications.  You can take 1 to 2 tablets of Tylenol (350mg -1000mg  depending on the dose) every 6 hours as needed for pain.  Do not exceed 4000 mg of Tylenol daily.    You can elevate the extremities to reduce swelling.  Return to the emergency department if any concerning signs or symptoms develop such as fever, vomiting, worsening swelling, streaking of redness up the leg, abnormal drainage.

## 2019-09-12 NOTE — ED Provider Notes (Signed)
[facility] EMERGENCY DEPARTMENT Provider Note   CSN: [id number] Arrival date & time: 09/12/19  1554     History Chief Complaint  Patient presents with  . Insect Bite    [name] is a 44 y.o. male with history of bipolar disorder, schizophrenia, hyperlipidemia, bronchitis presents for evaluation of acute onset, persistent rash for 2 days.  He reports that on Friday night he felt a black spider crawling down his right leg.  He is not sure if the spider bit him but noticed the rash developing after this.  Denies pain, pruritus, numbness, weakness, fever, nausea, vomiting, diaphoresis, chest pain, shortness of breath.  He denies recent travel or surgeries, prior history of DVT or PE, hemoptysis, or testosterone hormone replacement therapy.Marland Kitchen  He notes some mild edema to the bilateral lower extremities.  He has been applying a topical cream without relief.  The history is provided by the patient.       Past Medical History:  Diagnosis Date  . Bipolar disorder (HCC)   . Bronchitis   . Hyperlipidemia   . Paranoid schizophrenia (HCC)   . Schizophrenia [facility])     Patient Active Problem List   Diagnosis Date Noted  . Paranoid schizophrenia (HCC) 05/31/2019  . Tobacco use disorder   . Schizoaffective disorder, bipolar type (HCC) 06/29/2014    Past Surgical History:  Procedure Laterality Date  . TESTICLE SURGERY         Family History  Problem Relation Age of Onset  . Schizophrenia Neg Hx     Social History   Tobacco Use  . Smoking status: Current Every Day Smoker    Packs/day: 1.00    Types: Cigarettes  . Smokeless tobacco: Never Used  Substance Use Topics  . Alcohol use: Not Currently    Comment: weekly  . Drug use: Not Currently    Types: Marijuana    Home Medications Prior to Admission medications   Medication Sig Start Date End Date Taking? Authorizing Provider  ABILIFY MAINTENA 400 MG PRSY prefilled syringe Inject 400 mg into the muscle every 28  (twenty-eight) days.  03/11/18   [provider]  benztropine (COGENTIN) 1 MG tablet Take 1 tablet (1 mg total) by mouth 2 (two) times daily. 06/03/19 06/02/20  Malvin Johns, MD  buPROPion (WELLBUTRIN XL) 150 MG 24 hr tablet Take 150 mg by mouth daily.     [provider]  cephALEXin (KEFLEX) 500 MG capsule Take 1 capsule (500 mg total) by mouth 2 (two) times daily for 7 days. 09/12/19 09/19/19  Michela Pitcher A, PA-C  diphenhydrAMINE (BENADRYL) 25 MG tablet Take 1 tablet (25 mg total) by mouth every 6 (six) hours as needed for itching. 07/31/19   Burgess Amor, PA-C  fluPHENAZine (PROLIXIN) 10 MG tablet Take 2 tablets (20 mg total) by mouth at bedtime. 06/03/19   Malvin Johns, MD  hydrOXYzine (ATARAX/VISTARIL) 50 MG tablet Take 50 mg by mouth 3 (three) times daily as needed for anxiety.     [provider]  topiramate (TOPAMAX) 50 MG tablet Take 1 tablet (50 mg total) by mouth 2 (two) times daily. 06/03/19 06/02/20  Malvin Johns, MD  traZODone (DESYREL) 50 MG tablet Take 1 tablet (50 mg total) by mouth at bedtime. 06/03/19   Malvin Johns, MD  triamcinolone cream (KENALOG) 0.1 % Apply 1 application topically daily as needed (eczema).  10/13/18   [provider]    Allergies    Other  Review of Systems  Review of Systems  Constitutional: Negative for chills and fever.  Respiratory: Negative for shortness of breath.   Cardiovascular: Positive for leg swelling. Negative for chest pain.  Gastrointestinal: Negative for abdominal pain, nausea and vomiting.  Skin: Positive for color change.  Neurological: Negative for weakness and numbness.    Physical Exam Updated Vital Signs BP 136/79 (BP Location: Right Arm)   Pulse 88   Temp 98.6 F (37 C) (Oral)   Resp 18   Ht 6\' 1"  (1.854 m)   Wt 127 kg   SpO2 96%   BMI 36.94 kg/m   Physical Exam Vitals and nursing note reviewed.  Constitutional:      General: He is not in acute distress.    Appearance: He is well-developed.    HENT:     Head: Normocephalic and atraumatic.  Eyes:     General:        Right eye: No discharge.        Left eye: No discharge.     Conjunctiva/sclera: Conjunctivae normal.  Neck:     Vascular: No JVD.     Trachea: No tracheal deviation.  Cardiovascular:     Rate and Rhythm: Normal rate.     Pulses: Normal pulses.     Comments: 2+ DP/PT pulses bilaterally.  Trace pitting edema of the bilateral lower extremities, right worse than left.  ' sign absent bilaterally.  No palpable cords.  Compartments are soft. Pulmonary:     Effort: Pulmonary effort is normal.  Abdominal:     General: There is no distension.  Musculoskeletal:        General: Swelling present. No tenderness.     Right lower leg: Edema present.     Left lower leg: Edema present.     Comments: 5/5 strength of BLE major muscle groups.  Skin:    General: Skin is warm.     Capillary Refill: Capillary refill takes less than 2 seconds.     Findings: Erythema and rash present.     Comments: See below images.  Patient with a petechial circumferential rash involving the right lower leg.  It is fairly well demarcated along the edge of the patient's sock.  He has a similar lesion involving the posterior aspect of the left lower leg.  The rash is nontender to palpation.  Nikolsky sign is absent.  He has skin lesions involving the lower extremities that appear almost like ulcers that he reports are chronic and unchanged from baseline.  No tenderness to palpation involving these lesions.  Neurological:     Mental Status: He is alert.     Comments: Sensation intact light touch of bilateral lower extremities  Psychiatric:        Behavior: Behavior normal.           ED Results / Procedures / Treatments   Labs (all labs ordered are listed, but only abnormal results are displayed) Labs Reviewed  BASIC METABOLIC PANEL - Abnormal; Notable for the following components:      Result Value   Glucose, Bld 118 (*)    All other  components within normal limits  CBC WITH DIFFERENTIAL/PLATELET  CBC    EKG None  Radiology No results found.  Procedures Procedures (including critical care time)  Medications Ordered in ED Medications  enoxaparin (LOVENOX) injection 40 mg (has no administration in time range)    ED Course  I have reviewed the triage vital signs and the nursing notes.  Pertinent labs &  imaging results that were available during my care of the patient were reviewed by me and considered in my medical decision making (see chart for details).    MDM Rules/Calculators/A&P                      Patient presenting for evaluation of lower extremity swelling and rash.  He also has chronic lesions to his lower extremities which he reports are unchanged.  He is afebrile, vital signs are stable.  He is nontoxic in appearance.  He is neurovascularly intact and ambulatory without difficulty.  Bevelyn Buckles' sign absent bilaterally.  The rash itself appears somewhat petechial and is circumferential around the right lower extremity.  It appears to be well demarcated along the upper border of his sock.  He exhibits no blistering, vesicles, or abnormal drainage.  Doubt [facility] spotted fever, syphilis, TENS, SJS, or other life-threatening dermatologic condition.  No evidence of airway compromise or angioedema.  Due to the petechial nature of the rash, blood work was obtained.  This was reviewed and interpreted by myself which shows no leukocytosis, no anemia, no metabolic derangements, no renal insufficiency.  Clinically he is well-appearing.  Given the swelling and skin color changes we will obtain bilateral lower extremity Doppler study to be performed tomorrow when vascular technicians are available.  We will give him a dose of Lovenox today in the meantime.  I doubt PE or other cardiopulmonary abnormality in the absence of chest pain, shortness of breath, tachycardia, tachypnea, hypoxia, or increased work of breathing.  If  the DVT studies are negative tomorrow, then he can start a course of antibiotics for management of his rash.  Recommend follow-up with PCP for reevaluation of symptoms.  Discussed indications for return to the ED sooner.  Patient verbalized understanding of and agreement with plan and is stable for discharge at this time.   Final Clinical Impression(s) / ED Diagnoses Final diagnoses:  Rash  Lower extremity edema    Rx / DC Orders ED Discharge Orders         Ordered    US Venous Img Lower Bilateral     09/12/19 1738    cephALEXin (KEFLEX) 500 MG capsule  2 times daily     09/12/19 1808           Renita Papa, PA-C 09/12/19 Anola Gurney, MD 09/12/19 2334

## 2019-09-12 NOTE — Progress Notes (Signed)
ANTICOAGULATION CONSULT NOTE - Follow Up Consult  Pharmacy Consult for lovenox Indication: VTE prophylaxis  Allergies  Allergen Reactions  . Other     Some trial medicine from [facility].     Patient Measurements: Height: 6\' 1"  (185.4 cm) Weight: 127 kg (280 lb) IBW/kg (Calculated) : 79.9  Vital Signs: Temp: 98.6 F (37 C) (05/16 1601) Temp Source: Oral (05/16 1601) BP: 136/79 (05/16 1601) Pulse Rate: 88 (05/16 1601)  Labs: Recent Labs    09/12/19 1630  HGB 14.2  HCT 41.4  PLT 289  CREATININE 0.97    Estimated Creatinine Clearance: 135.7 mL/min (by C-G formula based on SCr of 0.97 mg/dL).   Medications:  (Not in a hospital admission)  Scheduled:  . enoxaparin (LOVENOX) injection  40 mg Subcutaneous Q12H   Infusions:   PRN:  Anti-infectives (From admission, onward)   None      Assessment: 44 year old male, recent spider bite to RLE, suspected DVT. Obese male BMI 37.    Goal of Therapy:  VTE prophylaxis Monitor platelets by anticoagulation protocol: Yes   Plan:  Lovenox 40mg  subq q12h  CBC MWF Monitor for signs and symtoms of bleeding   59 Tashi Andujo 09/12/2019,5:53 PM

## 2019-09-17 ENCOUNTER — Emergency Department (HOSPITAL_COMMUNITY)
Admission: EM | Admit: 2019-09-17 | Discharge: 2019-09-17 | Discharge status: Against Medical Advice | Payer: Medicare Other

## 2019-09-17 NOTE — ED Triage Notes (Signed)
Decided not to stay for treatment after area on abdomen explained to be result of lovenox injection

## 2019-09-22 ENCOUNTER — Emergency Department (HOSPITAL_COMMUNITY)
Admission: EM | Admit: 2019-09-22 | Discharge: 2019-09-23 | Disposition: A | Discharge status: Home / Self Care | Payer: Medicare Other | Attending: Emergency Medicine | Admitting: Emergency Medicine

## 2019-09-22 ENCOUNTER — Other Ambulatory Visit: Payer: Self-pay

## 2019-09-22 ENCOUNTER — Encounter (HOSPITAL_COMMUNITY): Payer: Self-pay | Admitting: Emergency Medicine

## 2019-09-22 DIAGNOSIS — F200 Paranoid schizophrenia: Secondary | ICD-10-CM | POA: Diagnosis not present

## 2019-09-22 DIAGNOSIS — Z046 Encounter for general psychiatric examination, requested by authority: Secondary | ICD-10-CM | POA: Diagnosis present

## 2019-09-22 DIAGNOSIS — F17210 Nicotine dependence, cigarettes, uncomplicated: Secondary | ICD-10-CM | POA: Insufficient documentation

## 2019-09-22 DIAGNOSIS — R440 Auditory hallucinations: Secondary | ICD-10-CM | POA: Insufficient documentation

## 2019-09-22 DIAGNOSIS — Z79899 Other long term (current) drug therapy: Secondary | ICD-10-CM | POA: Diagnosis not present

## 2019-09-22 DIAGNOSIS — R443 Hallucinations, unspecified: Secondary | ICD-10-CM

## 2019-09-22 DIAGNOSIS — Z20822 Contact with and (suspected) exposure to covid-19: Secondary | ICD-10-CM | POA: Diagnosis not present

## 2019-09-22 DIAGNOSIS — F1210 Cannabis abuse, uncomplicated: Secondary | ICD-10-CM | POA: Insufficient documentation

## 2019-09-22 NOTE — ED Provider Notes (Addendum)
[facility] EMERGENCY DEPARTMENT Provider Note   CSN: [id number] Arrival date & time: 09/22/19  2303     History Chief Complaint  Patient presents with  . Hearing voices    [name] is a 44 y.o. male.  Patient presents to the emergency department for evaluation of auditory hallucinations.  Patient reports that he is having trouble living in his apartment because of the voices.  He is not homicidal or suicidal.        Past Medical History:  Diagnosis Date  . Bipolar disorder (HCC)   . Bronchitis   . Hyperlipidemia   . Paranoid schizophrenia (HCC)   . Schizophrenia [facility])     Patient Active Problem List   Diagnosis Date Noted  . Paranoid schizophrenia (HCC) 05/31/2019  . Tobacco use disorder   . Schizoaffective disorder, bipolar type (HCC) 06/29/2014    Past Surgical History:  Procedure Laterality Date  . TESTICLE SURGERY         Family History  Problem Relation Age of Onset  . Schizophrenia Neg Hx     Social History   Tobacco Use  . Smoking status: Current Every Day Smoker    Packs/day: 1.00    Types: Cigarettes  . Smokeless tobacco: Never Used  Substance Use Topics  . Alcohol use: Not Currently    Comment: weekly  . Drug use: Not Currently    Types: Marijuana    Home Medications Prior to Admission medications   Medication Sig Start Date End Date Taking? Authorizing Provider  ABILIFY MAINTENA 400 MG PRSY prefilled syringe Inject 400 mg into the muscle every 28 (twenty-eight) days.  03/11/18   [provider]  benztropine (COGENTIN) 1 MG tablet Take 1 tablet (1 mg total) by mouth 2 (two) times daily. 06/03/19 06/02/20  Malvin Johns, MD  buPROPion (WELLBUTRIN XL) 150 MG 24 hr tablet Take 150 mg by mouth daily.     [provider]  diphenhydrAMINE (BENADRYL) 25 MG tablet Take 1 tablet (25 mg total) by mouth every 6 (six) hours as needed for itching. 07/31/19   Burgess Amor, PA-C  fluPHENAZine (PROLIXIN) 10 MG tablet Take 2 tablets  (20 mg total) by mouth at bedtime. 06/03/19   Malvin Johns, MD  hydrOXYzine (ATARAX/VISTARIL) 50 MG tablet Take 50 mg by mouth 3 (three) times daily as needed for anxiety.     [provider]  topiramate (TOPAMAX) 50 MG tablet Take 1 tablet (50 mg total) by mouth 2 (two) times daily. 06/03/19 06/02/20  Malvin Johns, MD  traZODone (DESYREL) 50 MG tablet Take 1 tablet (50 mg total) by mouth at bedtime. 06/03/19   Malvin Johns, MD  triamcinolone cream (KENALOG) 0.1 % Apply 1 application topically daily as needed (eczema).  10/13/18   [provider]    Allergies    Other  Review of Systems   Review of Systems  Psychiatric/Behavioral: Positive for hallucinations.  All other systems reviewed and are negative.   Physical Exam Updated Vital Signs BP 138/85 (BP Location: Right Arm)   Pulse 88   Temp 98 F (36.7 C) (Temporal)   Resp 16   Ht 6\' 1"  (1.854 m)   Wt 127 kg   SpO2 99%   BMI 36.94 kg/m   Physical Exam Vitals and nursing note reviewed.  Constitutional:      General: He is not in acute distress.    Appearance: Normal appearance. He is well-developed.  HENT:     Head: Normocephalic and atraumatic.  Right Ear: Hearing normal.     Left Ear: Hearing normal.     Nose: Nose normal.  Eyes:     Conjunctiva/sclera: Conjunctivae normal.     Pupils: Pupils are equal, round, and reactive to light.  Cardiovascular:     Rate and Rhythm: Regular rhythm.     Heart sounds: S1 normal and S2 normal. No murmur. No friction rub. No gallop.   Pulmonary:     Effort: Pulmonary effort is normal. No respiratory distress.     Breath sounds: Normal breath sounds.  Chest:     Chest wall: No tenderness.  Abdominal:     General: Bowel sounds are normal.     Palpations: Abdomen is soft.     Tenderness: There is no abdominal tenderness. There is no guarding or rebound. Negative signs include Murphy's sign and McBurney's sign.     Hernia: No hernia is present.  Musculoskeletal:         General: Normal range of motion.     Cervical back: Normal range of motion and neck supple.  Skin:    General: Skin is warm and dry.     Findings: No rash.  Neurological:     Mental Status: He is alert and oriented to person, place, and time.     GCS: GCS eye subscore is 4. GCS verbal subscore is 5. GCS motor subscore is 6.     Cranial Nerves: No cranial nerve deficit.     Sensory: No sensory deficit.     Coordination: Coordination normal.  Psychiatric:        Speech: Speech normal.        Behavior: Behavior normal.        Thought Content: Thought content is paranoid.     ED Results / Procedures / Treatments   Labs (all labs ordered are listed, but only abnormal results are displayed) Labs Reviewed  SARS CORONAVIRUS 2 BY RT PCR (HOSPITAL ORDER, [facility])  CBC  COMPREHENSIVE METABOLIC PANEL  ETHANOL  RAPID URINE DRUG SCREEN, HOSP PERFORMED    EKG EKG Interpretation  Date/Time:  Wednesday Sep 22 2019 23:51:56 EDT Ventricular Rate:  81 PR Interval:  136 QRS Duration: 92 QT Interval:  356 QTC Calculation: 413 R Axis:   65 Text Interpretation: Normal sinus rhythm Normal ECG Confirmed by Orpah Greek [id number]) on 09/23/2019 12:00:51 AM   Radiology No results found.  Procedures Procedures (including critical care time)  Medications Ordered in ED Medications - No data to display  ED Course  I have reviewed the triage vital signs and the nursing notes.  Pertinent labs & imaging results that were available during my care of the patient were reviewed by me and considered in my medical decision making (see chart for details).    MDM Rules/Calculators/A&P                      Patient with history of schizophrenia presents to the emergency department for evaluation of auditory hallucinations.  Will require psychiatric evaluation to determine need for treatment.  Final Clinical Impression(s) / ED Diagnoses Final diagnoses:    Hallucinations    Rx / DC Orders ED Discharge Orders    None       Milam Allbaugh, Gwenyth Allegra, MD 09/22/19 7209    Orpah Greek, MD 09/23/19 0001

## 2019-09-22 NOTE — ED Triage Notes (Signed)
Pt states he has an apartment that is paid for but that he cannot stay there because he is hearing voices. States the landlord "won't even change the toilet because they hear the voices too. States everyone hears the voices". Denies SI and HI.

## 2019-09-23 DIAGNOSIS — R440 Auditory hallucinations: Secondary | ICD-10-CM | POA: Diagnosis not present

## 2019-09-23 LAB — COMPREHENSIVE METABOLIC PANEL
ALT: 35 U/L (ref 0–44)
AST: 34 U/L (ref 15–41)
Albumin: 3.9 g/dL (ref 3.5–5.0)
Alkaline Phosphatase: 63 U/L (ref 38–126)
Anion gap: 8 (ref 5–15)
BUN: 11 mg/dL (ref 6–20)
CO2: 25 mmol/L (ref 22–32)
Calcium: 8.7 mg/dL — ABNORMAL LOW (ref 8.9–10.3)
Chloride: 105 mmol/L (ref 98–111)
Creatinine, Ser: 1.06 mg/dL (ref 0.61–1.24)
GFR calc Af Amer: >60 mL/min (ref >60)
GFR calc non Af Amer: >60 mL/min (ref >60)
Glucose, Bld: 100 mg/dL — ABNORMAL HIGH (ref 70–99)
Potassium: 3.6 mmol/L (ref 3.5–5.1)
Sodium: 138 mmol/L (ref 135–145)
Total Bilirubin: 0.6 mg/dL (ref 0.3–1.2)
Total Protein: 6.6 g/dL (ref 6.5–8.1)

## 2019-09-23 LAB — RAPID URINE DRUG SCREEN, HOSP PERFORMED
Amphetamines: NOT DETECTED
Barbiturates: NOT DETECTED
Benzodiazepines: NOT DETECTED
Cocaine: NOT DETECTED
Opiates: NOT DETECTED
Tetrahydrocannabinol: NOT DETECTED

## 2019-09-23 LAB — CBC
HCT: 39.4 % (ref 39.0–52.0)
Hemoglobin: 13.6 g/dL (ref 13.0–17.0)
MCH: 30.8 pg (ref 26.0–34.0)
MCHC: 34.5 g/dL (ref 30.0–36.0)
MCV: 89.1 fL (ref 80.0–100.0)
Platelets: 284 10*3/uL (ref 150–400)
RBC: 4.42 MIL/uL (ref 4.22–5.81)
RDW: 12.4 % (ref 11.5–15.5)
WBC: 8.7 10*3/uL (ref 4.0–10.5)
nRBC: 0 % (ref 0.0–0.2)

## 2019-09-23 LAB — ETHANOL: Alcohol, Ethyl (B): <10 mg/dL (ref <10)

## 2019-09-23 MED ORDER — TRAZODONE HCL 50 MG PO TABS: 50.0000 mg | ORAL_TABLET | Freq: Every day | ORAL | Status: DC

## 2019-09-23 MED ORDER — BUPROPION HCL ER (XL) 150 MG PO TB24
150.0000 mg | ORAL_TABLET | Freq: Every day | ORAL | Status: DC
  Filled 2019-09-23 (×2): qty 1

## 2019-09-23 MED ORDER — BENZTROPINE MESYLATE 1 MG PO TABS: 1.0000 mg | ORAL_TABLET | Freq: Two times a day (BID) | ORAL | Status: DC

## 2019-09-23 MED ORDER — HYDROXYZINE HCL 25 MG PO TABS: 50.0000 mg | ORAL_TABLET | Freq: Three times a day (TID) | ORAL | Status: DC | PRN

## 2019-09-23 MED ORDER — FLUPHENAZINE HCL 10 MG PO TABS
20.0000 mg | ORAL_TABLET | Freq: Every day | ORAL | Status: DC
  Filled 2019-09-23 (×2): qty 2

## 2019-09-23 MED ORDER — TOPIRAMATE 25 MG PO TABS: 50.0000 mg | ORAL_TABLET | Freq: Two times a day (BID) | ORAL | Status: DC

## 2019-09-23 NOTE — BH Assessment (Addendum)
Tele Assessment Note   Patient Name: [name] MRN: [id number] Referring Physician: Dr Jaci Carrel Location of Patient: [facility] Location of Provider: Behavioral Health TTS Department  [name] is an 44 y.o. male presenting to the ED voluntarily with paranoia and auditory hallucinations. Patient denied SI, HI and drug alcohol usage. Patient reported hearing voices only in his apartment and that he cannot stay there. Patient reported the landlord "won't even change the toilet because they hear the voices too. Patient reported that everyone that comes in his apartment hears the same voices. During assessment patient continued to state, "you have everything you need in front of you, my chart, everything you need to know is right there, you know what to do". Patient unable to describe what voices are saying, however stated they are not command voices.   Patient denied history of suicide attempts, self harming behaviors and alcohol drug usage. Patient is currently receiving medication management through [facility], whom comes out to his home. Patient reported consistently taking his medication and that his medications are working.   Patient reported living alone. Patient reported having lack of family support since he didn't attend his oldest brothers funeral 3 years ago. Patient reported no access to guns. Patient was cooperative during assessment. Patient reported that he is able to contract for safety if being discharged. Patient provided no collateral contact information.  Diagnosis: Paraoid Schizophrenia  Past Medical History:  Past Medical History:  Diagnosis Date  . Bipolar disorder (HCC)   . Bronchitis   . Hyperlipidemia   . Paranoid schizophrenia (HCC)   . Schizophrenia [facility])     Past Surgical History:  Procedure Laterality Date  . TESTICLE SURGERY      Family History:  Family History  Problem Relation Age of Onset  . Schizophrenia Neg Hx     Social History:   reports that he has been smoking cigarettes. He has been smoking about 1.00 pack per day. He has never used smokeless tobacco. He reports previous alcohol use. He reports previous drug use. Drug: Marijuana.  Additional Social History:  Alcohol / Drug Use Pain Medications: see MAR Prescriptions: see MAR Over the Counter: see MAR  CIWA: CIWA-Ar BP: 138/85 Pulse Rate: 88 COWS:    Allergies:  Allergies  Allergen Reactions  . Other     Some trial medicine from [facility].     Home Medications: (Not in a hospital admission)   OB/GYN Status:  No LMP for male patient.  General Assessment Data Location of Assessment: [facility] TTS Assessment: In system Is this a Tele or Face-to-Face Assessment?: Tele Assessment Is this an Initial Assessment or a Re-assessment for this encounter?: Initial Assessment Patient Accompanied by:: N/A Language Other than English: No Living Arrangements: (alone) What gender do you identify as?: Male Marital status: Single Living Arrangements: Alone Can pt return to current living arrangement?: Yes Admission Status: Voluntary Is patient capable of signing voluntary admission?: Yes Referral Source: Self/Family/Friend  Crisis Care Plan Living Arrangements: Alone Legal Guardian: (self) Name of Psychiatrist: Teacher, adult education) Name of Therapist: Teacher, adult education)  Education Status Is patient currently in school?: No Is the patient employed, unemployed or receiving disability?: Unemployed  Risk to self with the past 6 months Suicidal Ideation: No Has patient been a risk to self within the past 6 months prior to admission? : No Suicidal Intent: No Has patient had any suicidal intent within the past 6 months prior to admission? : No Is patient at risk for suicide?: No Suicidal  Plan?: No Has patient had any suicidal plan within the past 6 months prior to admission? : No Access to Means: No What has been your use of drugs/alcohol within the last 12 months?: (none) Previous  Attempts/Gestures: No How many times?: (0) Other Self Harm Risks: (none) Triggers for Past Attempts: (n/a) Intentional Self Injurious Behavior: None Family Suicide History: Unknown Recent stressful life event(s): (hallucinations) Persecutory voices/beliefs?: No Depression: No Depression Symptoms: (denied) Substance abuse history and/or treatment for substance abuse?: No Suicide prevention information given to non-admitted patients: Not applicable  Risk to Others within the past 6 months Homicidal Ideation: No Does patient have any lifetime risk of violence toward others beyond the six months prior to admission? : No Thoughts of Harm to Others: No Current Homicidal Intent: No Current Homicidal Plan: No Access to Homicidal Means: No Identified Victim: (n/a) History of harm to others?: No Assessment of Violence: None Noted Violent Behavior Description: (none) Does patient have access to weapons?: No Criminal Charges Pending?: No Does patient have a court date: No Is patient on probation?: No  Psychosis Hallucinations: Auditory, Visual Delusions: Unspecified  Mental Status Report Appearance/Hygiene: Unremarkable Eye Contact: Good Motor Activity: Freedom of movement Speech: Logical/coherent Level of Consciousness: Quiet/awake Mood: Sad Affect: Appropriate to circumstance Anxiety Level: None Thought Processes: Coherent, Relevant Judgement: Impaired Orientation: Person, Place, Time, Situation Obsessive Compulsive Thoughts/Behaviors: None  Cognitive Functioning Concentration: Good Memory: Recent Intact, Remote Intact Is patient IDD: No Insight: Poor Impulse Control: Poor Appetite: Good Have you had any weight changes? : No Change Sleep: Decreased Total Hours of Sleep: ("differs") Vegetative Symptoms: None  ADLScreening [facility] Assessment Services) Patient's cognitive ability adequate to safely complete daily activities?: Yes Patient able to express need for  assistance with ADLs?: Yes Independently performs ADLs?: Yes (appropriate for developmental age)  Prior Inpatient Therapy Prior Inpatient Therapy: Yes Prior Therapy Dates: (unknown) Prior Therapy Facilty/Provider(s): (unknown) Reason for Treatment: (mental illness)  Prior Outpatient Therapy Prior Outpatient Therapy: Yes Prior Therapy Dates: (present) Prior Therapy Facilty/Provider(s): ([facility]) Reason for Treatment: (schizophrenia) Does patient have an ACCT team?: No Does patient have Intensive In-House Services?  : No Does patient have [facility] services? : No Does patient have P4CC services?: No  ADL Screening (condition at time of admission) Patient's cognitive ability adequate to safely complete daily activities?: Yes Patient able to express need for assistance with ADLs?: Yes Independently performs ADLs?: Yes (appropriate for developmental age)  Disposition:  Disposition Initial Assessment Completed for this Encounter: Yes  Talbot Grumbling, NP, patient does not meet inpatient criteria. Patient to follow up with [facility].  This service was provided via telemedicine using a 2-way, interactive audio and video technology.  Names of all persons participating in this telemedicine service and their role in this encounter. Name: [name] Role: Patient  Name: Kirtland Bouchard Role: TTS Clinician  Name:  Role:   Name:  Role:     Venora Maples 09/23/2019 4:58 AM

## 2019-10-07 ENCOUNTER — Emergency Department (HOSPITAL_COMMUNITY)
Admission: EM | Admit: 2019-10-07 | Discharge: 2019-10-07 | Disposition: A | Discharge status: Home / Self Care | Payer: Medicare Other | Attending: Emergency Medicine | Admitting: Emergency Medicine

## 2019-10-07 ENCOUNTER — Other Ambulatory Visit: Payer: Self-pay

## 2019-10-07 ENCOUNTER — Encounter (HOSPITAL_COMMUNITY): Payer: Self-pay | Admitting: Emergency Medicine

## 2019-10-07 DIAGNOSIS — F200 Paranoid schizophrenia: Secondary | ICD-10-CM | POA: Diagnosis not present

## 2019-10-07 DIAGNOSIS — R443 Hallucinations, unspecified: Secondary | ICD-10-CM

## 2019-10-07 DIAGNOSIS — F17210 Nicotine dependence, cigarettes, uncomplicated: Secondary | ICD-10-CM | POA: Insufficient documentation

## 2019-10-07 DIAGNOSIS — F22 Delusional disorders: Secondary | ICD-10-CM | POA: Diagnosis present

## 2019-10-07 NOTE — Discharge Instructions (Addendum)
Please take your medications.  Please contact your act team today so he can talk to them about how you are feeling.  Return to the emergency department if you have like you are going to harm yourself or harm somebody else.

## 2019-10-07 NOTE — ED Triage Notes (Addendum)
Pt states " I am going through so much right now." "I don't want to talk about what is going on." Pt tearful in triage. Denies SI/HI and auditory or visual hallucinations.
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
## 2019-11-03 ENCOUNTER — Other Ambulatory Visit: Payer: Self-pay

## 2019-11-03 ENCOUNTER — Encounter (HOSPITAL_COMMUNITY): Payer: Self-pay | Admitting: Emergency Medicine

## 2019-11-03 ENCOUNTER — Emergency Department (HOSPITAL_COMMUNITY)
Admission: EM | Admit: 2019-11-03 | Discharge: 2019-11-03 | Disposition: A | Discharge status: Home / Self Care | Payer: Medicare Other | Attending: Emergency Medicine | Admitting: Emergency Medicine

## 2019-11-03 DIAGNOSIS — F17210 Nicotine dependence, cigarettes, uncomplicated: Secondary | ICD-10-CM | POA: Diagnosis not present

## 2019-11-03 DIAGNOSIS — M6282 Rhabdomyolysis: Secondary | ICD-10-CM | POA: Insufficient documentation

## 2019-11-03 DIAGNOSIS — R109 Unspecified abdominal pain: Secondary | ICD-10-CM | POA: Diagnosis present

## 2019-11-03 LAB — COMPREHENSIVE METABOLIC PANEL
ALT: 87 U/L — ABNORMAL HIGH (ref 0–44)
AST: 62 U/L — ABNORMAL HIGH (ref 15–41)
Albumin: 3.9 g/dL (ref 3.5–5.0)
Alkaline Phosphatase: 65 U/L (ref 38–126)
Anion gap: 8 (ref 5–15)
BUN: 13 mg/dL (ref 6–20)
CO2: 27 mmol/L (ref 22–32)
Calcium: 8.7 mg/dL — ABNORMAL LOW (ref 8.9–10.3)
Chloride: 101 mmol/L (ref 98–111)
Creatinine, Ser: 1.05 mg/dL (ref 0.61–1.24)
GFR calc Af Amer: >60 mL/min (ref >60)
GFR calc non Af Amer: >60 mL/min (ref >60)
Glucose, Bld: 108 mg/dL — ABNORMAL HIGH (ref 70–99)
Potassium: 3.6 mmol/L (ref 3.5–5.1)
Sodium: 136 mmol/L (ref 135–145)
Total Bilirubin: 0.5 mg/dL (ref 0.3–1.2)
Total Protein: 6.6 g/dL (ref 6.5–8.1)

## 2019-11-03 LAB — CBC
HCT: 39.4 % (ref 39.0–52.0)
Hemoglobin: 13.6 g/dL (ref 13.0–17.0)
MCH: 30.6 pg (ref 26.0–34.0)
MCHC: 34.5 g/dL (ref 30.0–36.0)
MCV: 88.5 fL (ref 80.0–100.0)
Platelets: 283 10*3/uL (ref 150–400)
RBC: 4.45 MIL/uL (ref 4.22–5.81)
RDW: 12.1 % (ref 11.5–15.5)
WBC: 9.3 10*3/uL (ref 4.0–10.5)
nRBC: 0 % (ref 0.0–0.2)

## 2019-11-03 LAB — CK: Total CK: 2334 U/L — ABNORMAL HIGH (ref 49–397)

## 2019-11-03 MED ORDER — SODIUM CHLORIDE 0.9 % IV BOLUS: 1000.0000 mL | Freq: Once | INTRAVENOUS | Status: DC

## 2019-11-03 NOTE — ED Provider Notes (Signed)
AP-EMERGENCY DEPT [facility] Emergency Department Provider Note MRN:  [id number]  Arrival date & time: 11/03/19     Chief Complaint   Multiple complaints History of Present Illness   [name] is a 44 y.o. year-old male with a history of schizophrenia presenting to the ED with chief complaint of multiple complaints.  Patient explains that last week he drank some milk at a friend's house and for 3 days after that he was experiencing some abdominal cramping.  The symptoms have improved.  He also explains that he walks 15 miles recently and has been having some leg pain.  Denies any discolored urine, no fever, no chest pain, no shortness of breath, no other complaints.  Review of Systems  A complete 10 system review of systems was obtained and all systems are negative except as noted in the HPI and PMH.   Patient's Health History    Past Medical History:  Diagnosis Date  . Bipolar disorder (HCC)   . Bronchitis   . Hyperlipidemia   . Paranoid schizophrenia (HCC)   . Schizophrenia [facility])     Past Surgical History:  Procedure Laterality Date  . TESTICLE SURGERY      Family History  Problem Relation Age of Onset  . Schizophrenia Neg Hx     Social History   Socioeconomic History  . Marital status: Single    Spouse name: Not on file  . Number of children: Not on file  . Years of education: Not on file  . Highest education level: Not on file  Occupational History  . Occupation: On disability  Tobacco Use  . Smoking status: Current Every Day Smoker    Packs/day: 1.00    Types: Cigarettes  . Smokeless tobacco: Never Used  Vaping Use  . Vaping Use: Never used  Substance and Sexual Activity  . Alcohol use: Not Currently    Comment: weekly  . Drug use: Not Currently    Types: Marijuana  . Sexual activity: Yes    Birth control/protection: Condom  Other Topics Concern  . Not on file  Social History Narrative   Pt lives alone in apartment complex for mentally  ill   Social Determinants of Health   Financial Resource Strain:   . Difficulty of Paying Living Expenses:   Food Insecurity:   . Worried About Programme researcher, broadcasting/film/video in the Last Year:   . Barista in the Last Year:   Transportation Needs:   . Freight forwarder (Medical):   Marland Kitchen Lack of Transportation (Non-Medical):   Physical Activity:   . Days of Exercise per Week:   . Minutes of Exercise per Session:   Stress:   . Feeling of Stress :   Social Connections:   . Frequency of Communication with Friends and Family:   . Frequency of Social Gatherings with Friends and Family:   . Attends Religious Services:   . Active Member of Clubs or Organizations:   . Attends Banker Meetings:   Marland Kitchen Marital Status:   Intimate Partner Violence:   . Fear of Current or Ex-Partner:   . Emotionally Abused:   Marland Kitchen Physically Abused:   . Sexually Abused:      Physical Exam   Vitals:   11/03/19 0037  BP: 136/90  Pulse: 94  Resp: 14  Temp: 99.4 F (37.4 C)  SpO2: 99%    CONSTITUTIONAL: Well-appearing, NAD NEURO:  Alert and oriented x 3, no focal deficits EYES:  eyes equal and reactive ENT/NECK:  no LAD, no JVD CARDIO: Regular rate, well-perfused, normal S1 and S2 PULM:  CTAB no wheezing or rhonchi GI/GU:  normal bowel sounds, non-distended, non-tender MSK/SPINE:  No gross deformities, no edema SKIN:  no rash, atraumatic PSYCH:  Appropriate speech and behavior  *Additional and/or pertinent findings included in MDM below  Diagnostic and Interventional Summary    EKG Interpretation  Date/Time:    Ventricular Rate:    PR Interval:    QRS Duration:   QT Interval:    QTC Calculation:   R Axis:     Text Interpretation:        Labs Reviewed  COMPREHENSIVE METABOLIC PANEL - Abnormal; Notable for the following components:      Result Value   Glucose, Bld 108 (*)    Calcium 8.7 (*)    AST 62 (*)    ALT 87 (*)    All other components within normal limits  CK -  Abnormal; Notable for the following components:   Total CK 2,334 (*)    All other components within normal limits  CBC    No orders to display    Medications - No data to display   Procedures  /  Critical Care Procedures  ED Course and Medical Decision Making  I have reviewed the triage vital signs, the nursing notes, and pertinent available records from the EMR.  Listed above are laboratory and imaging tests that I personally ordered, reviewed, and interpreted and then considered in my medical decision making (see below for details).      Symptoms possibly driven by psychiatric condition.  Reassuring vital signs, legs without swelling or edema or significant tenderness, will obtain screening labs to evaluate for rhabdo.  Suspect discharge.  Labs reveal CK elevation at 2000.  Initial plan was to provide IV fluids and recheck level in 2 hours.  Patient prefers to be discharged.  He explains that he had some muscle soreness last week but no longer has any soreness.  This means he is likely on the downtrend/recovery.  And therefore discharge is appropriate so long as he has his levels rechecked within the next few days and pushes oral fluids at home and avoids exertional activity, which he agrees to do.  Elmer Sow. Pilar Plate, MD [facility] Emergency Medicine [facility] [email or web address]  Final Clinical Impressions(s) / ED Diagnoses     ICD-10-CM   1. Non-traumatic rhabdomyolysis  M62.82     ED Discharge Orders    None       Discharge Instructions Discussed with and Provided to Patient:     Discharge Instructions     You were evaluated in the Emergency Department and after careful evaluation, we did not find any emergent condition requiring admission or further testing in the hospital.  Your labs showed some muscle damage.  As discussed, it is important that you have a lab recheck within the next 3 to 5 days.  Please try to schedule this with your primary care  doctor.  If you cannot do so, you can come to the emergency department for repeat check.  Please return to the Emergency Department if you experience any worsening of your condition.  We encourage you to follow up with a primary care provider.  Thank you for allowing [place] to be a part of your care.       Sabas Sous, MD 11/03/19 [phone]
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
## 2019-11-03 NOTE — ED Notes (Signed)
Pt alert and oriented. Left with d/c papers without updated v/s and signature

## 2019-11-03 NOTE — Discharge Instructions (Addendum)
You were evaluated in the Emergency Department and after careful evaluation, we did not find any emergent condition requiring admission or further testing in the hospital.  Your labs showed some muscle damage.  As discussed, it is important that you have a lab recheck within the next 3 to 5 days.  Please try to schedule this with your primary care doctor.  If you cannot do so, you can come to the emergency department for repeat check.  Please return to the Emergency Department if you experience any worsening of your condition.  We encourage you to follow up with a primary care provider.  Thank you for allowing [place] to be a part of your care.

## 2019-11-22 DIAGNOSIS — R030 Elevated blood-pressure reading, without diagnosis of hypertension: Secondary | ICD-10-CM | POA: Diagnosis not present

## 2019-11-24 DIAGNOSIS — R944 Abnormal results of kidney function studies: Secondary | ICD-10-CM | POA: Diagnosis not present

## 2019-11-24 DIAGNOSIS — G479 Sleep disorder, unspecified: Secondary | ICD-10-CM | POA: Diagnosis not present

## 2019-11-24 DIAGNOSIS — E781 Pure hyperglyceridemia: Secondary | ICD-10-CM | POA: Diagnosis not present

## 2019-11-25 DIAGNOSIS — E781 Pure hyperglyceridemia: Secondary | ICD-10-CM | POA: Diagnosis not present

## 2019-11-25 DIAGNOSIS — G479 Sleep disorder, unspecified: Secondary | ICD-10-CM | POA: Diagnosis not present

## 2019-11-25 DIAGNOSIS — R944 Abnormal results of kidney function studies: Secondary | ICD-10-CM | POA: Diagnosis not present

## 2019-11-26 DIAGNOSIS — R944 Abnormal results of kidney function studies: Secondary | ICD-10-CM | POA: Diagnosis not present

## 2019-11-26 DIAGNOSIS — G479 Sleep disorder, unspecified: Secondary | ICD-10-CM | POA: Diagnosis not present

## 2019-11-26 DIAGNOSIS — E781 Pure hyperglyceridemia: Secondary | ICD-10-CM | POA: Diagnosis not present

## 2019-11-27 DIAGNOSIS — R944 Abnormal results of kidney function studies: Secondary | ICD-10-CM | POA: Diagnosis not present

## 2019-11-27 DIAGNOSIS — E781 Pure hyperglyceridemia: Secondary | ICD-10-CM | POA: Diagnosis not present

## 2019-11-27 DIAGNOSIS — G479 Sleep disorder, unspecified: Secondary | ICD-10-CM | POA: Diagnosis not present

## 2019-11-28 DIAGNOSIS — G479 Sleep disorder, unspecified: Secondary | ICD-10-CM | POA: Diagnosis not present

## 2019-11-28 DIAGNOSIS — I10 Essential (primary) hypertension: Secondary | ICD-10-CM | POA: Diagnosis not present

## 2019-11-28 DIAGNOSIS — R944 Abnormal results of kidney function studies: Secondary | ICD-10-CM | POA: Diagnosis not present

## 2020-01-10 ENCOUNTER — Encounter (HOSPITAL_COMMUNITY): Payer: Self-pay

## 2020-01-10 ENCOUNTER — Emergency Department (HOSPITAL_COMMUNITY)
Admission: EM | Admit: 2020-01-10 | Discharge: 2020-01-11 | Disposition: A | Discharge status: Home / Self Care | Payer: Medicare HMO | Attending: Emergency Medicine | Admitting: Emergency Medicine

## 2020-01-10 ENCOUNTER — Other Ambulatory Visit: Payer: Self-pay

## 2020-01-10 DIAGNOSIS — Z79899 Other long term (current) drug therapy: Secondary | ICD-10-CM | POA: Insufficient documentation

## 2020-01-10 DIAGNOSIS — F22 Delusional disorders: Secondary | ICD-10-CM | POA: Diagnosis present

## 2020-01-10 DIAGNOSIS — F17210 Nicotine dependence, cigarettes, uncomplicated: Secondary | ICD-10-CM | POA: Diagnosis not present

## 2020-01-10 DIAGNOSIS — F200 Paranoid schizophrenia: Secondary | ICD-10-CM | POA: Insufficient documentation

## 2020-01-10 DIAGNOSIS — Z20822 Contact with and (suspected) exposure to covid-19: Secondary | ICD-10-CM | POA: Diagnosis not present

## 2020-01-10 DIAGNOSIS — G4700 Insomnia, unspecified: Secondary | ICD-10-CM | POA: Insufficient documentation

## 2020-01-10 DIAGNOSIS — R5383 Other fatigue: Secondary | ICD-10-CM | POA: Diagnosis not present

## 2020-01-10 DIAGNOSIS — G479 Sleep disorder, unspecified: Secondary | ICD-10-CM

## 2020-01-10 LAB — RAPID URINE DRUG SCREEN, HOSP PERFORMED
Amphetamines: NOT DETECTED
Barbiturates: NOT DETECTED
Benzodiazepines: NOT DETECTED
Cocaine: NOT DETECTED
Opiates: NOT DETECTED
Tetrahydrocannabinol: NOT DETECTED

## 2020-01-10 LAB — COMPREHENSIVE METABOLIC PANEL
ALT: 39 U/L (ref 0–44)
AST: 45 U/L — ABNORMAL HIGH (ref 15–41)
Albumin: 4.6 g/dL (ref 3.5–5.0)
Alkaline Phosphatase: 74 U/L (ref 38–126)
Anion gap: 14 (ref 5–15)
BUN: 14 mg/dL (ref 6–20)
CO2: 25 mmol/L (ref 22–32)
Calcium: 9.8 mg/dL (ref 8.9–10.3)
Chloride: 97 mmol/L — ABNORMAL LOW (ref 98–111)
Creatinine, Ser: 1.23 mg/dL (ref 0.61–1.24)
GFR calc Af Amer: >60 mL/min (ref >60)
GFR calc non Af Amer: >60 mL/min (ref >60)
Glucose, Bld: 116 mg/dL — ABNORMAL HIGH (ref 70–99)
Potassium: 3.5 mmol/L (ref 3.5–5.1)
Sodium: 136 mmol/L (ref 135–145)
Total Bilirubin: 0.6 mg/dL (ref 0.3–1.2)
Total Protein: 7.8 g/dL (ref 6.5–8.1)

## 2020-01-10 LAB — URINALYSIS, ROUTINE W REFLEX MICROSCOPIC
Bilirubin Urine: NEGATIVE
Glucose, UA: NEGATIVE mg/dL
Hgb urine dipstick: NEGATIVE
Ketones, ur: NEGATIVE mg/dL
Leukocytes,Ua: NEGATIVE
Nitrite: NEGATIVE
Protein, ur: NEGATIVE mg/dL
Specific Gravity, Urine: 1.004 — ABNORMAL LOW (ref 1.005–1.030)
pH: 5 (ref 5.0–8.0)

## 2020-01-10 LAB — CBC
HCT: 46.3 % (ref 39.0–52.0)
Hemoglobin: 15.7 g/dL (ref 13.0–17.0)
MCH: 30.1 pg (ref 26.0–34.0)
MCHC: 33.9 g/dL (ref 30.0–36.0)
MCV: 88.7 fL (ref 80.0–100.0)
Platelets: 342 10*3/uL (ref 150–400)
RBC: 5.22 MIL/uL (ref 4.22–5.81)
RDW: 12.4 % (ref 11.5–15.5)
WBC: 13.9 10*3/uL — ABNORMAL HIGH (ref 4.0–10.5)
nRBC: 0 % (ref 0.0–0.2)

## 2020-01-10 LAB — SALICYLATE LEVEL: Salicylate Lvl: <7 mg/dL — ABNORMAL LOW (ref 7.0–30.0)

## 2020-01-10 LAB — ACETAMINOPHEN LEVEL: Acetaminophen (Tylenol), Serum: <10 ug/mL — ABNORMAL LOW (ref 10–30)

## 2020-01-10 LAB — SARS CORONAVIRUS 2 BY RT PCR (HOSPITAL ORDER, PERFORMED IN ~~LOC~~ HOSPITAL LAB): SARS Coronavirus 2: NEGATIVE

## 2020-01-10 LAB — ETHANOL: Alcohol, Ethyl (B): <10 mg/dL (ref <10)

## 2020-01-10 NOTE — ED Notes (Signed)
Patient dressed out in purple scrubs. States that he can not give a urine sample due to just using the bathroom. Patients belongings were put on the bottom shelf in the locker room. Patient has a book bag and two belonging bags.

## 2020-01-10 NOTE — ED Triage Notes (Signed)
Pt states he feels like he has been poisoned. States he drank 6 energy drinks and states he felt like he wasn't going to survive if he didn't drink it. Pt denies feeling threatened or harmed by anyone. Pt denies SI/HI

## 2020-01-10 NOTE — ED Provider Notes (Signed)
[facility] EMERGENCY DEPARTMENT Provider Note   CSN: [id number] Arrival date & time: 01/10/20  1650     History Chief Complaint  Patient presents with  . Medical Clearance    [name] is a 44 y.o. male with a history of paranoid schizophrenia, bipolar disorder, hyperlipidemia presenting for evaluation of increasing paranoia and insomnia.  He describes having difficulty sleeping, he has been sleeping several hours at night, but then wakes with a strong sensation of strangers present in his home.  He recognizes there is no one there, but is unable to shake this sensation and therefore stays awake the rest of the night.  He is also concerned about somebody trying to poison him, but is unable to give further detail about this.  He lives alone.  He believes someone is breaking into his house and trying to poison him.  He endorses generalized fatigue.  Just prior to arrival he felt like he was going to collapse due to fatigue while he was ambulating through town.  He drank 6 energy drinks prior to arrival to try to "make it here".  He had a recent psych admission for his schizophrenia, was admitted through [facility] to [facility] during which time he had medication changes.  His Abilify, Wellbutrin, Prolixin, Topamax and trazodone were discontinued.  He was placed on Invega injections which she states he had his last injection last week by a home health nurse through [facility].  He was also placed on amlodipine at the time of his discharge.  He states he was also receiving Ambien that helped him sleep during his admission there, but this was not continued at the time of his discharge.  He denies visual or auditory hallucinations, also denies homicide or suicidal ideation.  HPI     Past Medical History:  Diagnosis Date  . Bipolar disorder (HCC)   . Bronchitis   . Hyperlipidemia   . Paranoid schizophrenia (HCC)   . Schizophrenia [facility])     Patient Active Problem List    Diagnosis Date Noted  . Paranoid schizophrenia (HCC) 05/31/2019  . Tobacco use disorder   . Schizoaffective disorder, bipolar type (HCC) 06/29/2014    Past Surgical History:  Procedure Laterality Date  . TESTICLE SURGERY         Family History  Problem Relation Age of Onset  . Schizophrenia Neg Hx     Social History   Tobacco Use  . Smoking status: Current Every Day Smoker    Packs/day: 1.00    Types: Cigarettes  . Smokeless tobacco: Never Used  Vaping Use  . Vaping Use: Never used  Substance Use Topics  . Alcohol use: Not Currently    Comment: weekly  . Drug use: Not Currently    Types: Marijuana    Home Medications Prior to Admission medications   Medication Sig Start Date End Date Taking? Authorizing Provider  ABILIFY MAINTENA 400 MG PRSY prefilled syringe Inject 400 mg into the muscle every 28 (twenty-eight) days.  03/11/18   [provider]  benztropine (COGENTIN) 1 MG tablet Take 1 tablet (1 mg total) by mouth 2 (two) times daily. 06/03/19 06/02/20  Malvin Johns, MD  buPROPion (WELLBUTRIN XL) 150 MG 24 hr tablet Take 150 mg by mouth daily.     [provider]  diphenhydrAMINE (BENADRYL) 25 MG tablet Take 1 tablet (25 mg total) by mouth every 6 (six) hours as needed for itching. 07/31/19   Burgess Amor, PA-C  fluPHENAZine (  PROLIXIN) 10 MG tablet Take 2 tablets (20 mg total) by mouth at bedtime. 06/03/19   Malvin Johns, MD  hydrOXYzine (ATARAX/VISTARIL) 50 MG tablet Take 50 mg by mouth 3 (three) times daily as needed for anxiety.     [provider]  topiramate (TOPAMAX) 50 MG tablet Take 1 tablet (50 mg total) by mouth 2 (two) times daily. 06/03/19 06/02/20  Malvin Johns, MD  traZODone (DESYREL) 50 MG tablet Take 1 tablet (50 mg total) by mouth at bedtime. 06/03/19   Malvin Johns, MD  triamcinolone cream (KENALOG) 0.1 % Apply 1 application topically daily as needed (eczema).  10/13/18   [provider]    Allergies    Other  Review of  Systems   Review of Systems  Constitutional: Negative for chills and fever.  HENT: Negative for congestion and sore throat.   Eyes: Negative.   Respiratory: Negative for chest tightness and shortness of breath.   Cardiovascular: Negative for chest pain.  Gastrointestinal: Negative for abdominal pain and nausea.  Genitourinary: Negative.   Musculoskeletal: Negative for arthralgias, joint swelling and neck pain.  Skin: Negative.  Negative for rash and wound.  Neurological: Negative for dizziness, weakness, light-headedness, numbness and headaches.  Psychiatric/Behavioral: Positive for sleep disturbance. Negative for suicidal ideas. The patient is nervous/anxious.     Physical Exam Updated Vital Signs BP 126/82 (BP Location: Right Arm)   Pulse (!) 126   Temp 99.8 F (37.7 C) (Oral)   Resp 18   Ht 6\' 1"  (1.854 m)   Wt 123.8 kg   SpO2 100%   BMI 36.02 kg/m   Physical Exam Vitals and nursing note reviewed.  Constitutional:      General: He is not in acute distress.    Appearance: Normal appearance. He is well-developed.  HENT:     Head: Normocephalic and atraumatic.     Mouth/Throat:     Mouth: Mucous membranes are moist.  Eyes:     Conjunctiva/sclera: Conjunctivae normal.  Cardiovascular:     Rate and Rhythm: Normal rate and regular rhythm.     Heart sounds: Normal heart sounds.  Pulmonary:     Effort: Pulmonary effort is normal.     Breath sounds: Normal breath sounds. No wheezing.  Abdominal:     General: Bowel sounds are normal.     Palpations: Abdomen is soft.     Tenderness: There is no abdominal tenderness.  Musculoskeletal:        General: Normal range of motion.     Cervical back: Normal range of motion.  Skin:    General: Skin is warm and dry.  Neurological:     General: No focal deficit present.     Mental Status: He is alert.  Psychiatric:        Attention and Perception: Attention normal.        Mood and Affect: Mood is anxious.        Speech: Speech  normal.        Behavior: Behavior is hyperactive. Behavior is cooperative.        Thought Content: Thought content is paranoid and delusional. Thought content does not include homicidal or suicidal ideation.        Judgment: Judgment is inappropriate.     ED Results / Procedures / Treatments   Labs (all labs ordered are listed, but only abnormal results are displayed) Labs Reviewed  COMPREHENSIVE METABOLIC PANEL - Abnormal; Notable for the following components:      Result  Value   Chloride 97 (*)    Glucose, Bld 116 (*)    AST 45 (*)    All other components within normal limits  SALICYLATE LEVEL - Abnormal; Notable for the following components:   Salicylate Lvl <7.0 (*)    All other components within normal limits  ACETAMINOPHEN LEVEL - Abnormal; Notable for the following components:   Acetaminophen (Tylenol), Serum <10 (*)    All other components within normal limits  CBC - Abnormal; Notable for the following components:   WBC 13.9 (*)    All other components within normal limits  URINALYSIS, ROUTINE W REFLEX MICROSCOPIC - Abnormal; Notable for the following components:   Color, Urine STRAW (*)    Specific Gravity, Urine 1.004 (*)    All other components within normal limits  SARS CORONAVIRUS 2 BY RT PCR (HOSPITAL ORDER, PERFORMED IN [place] [facility])  ETHANOL  RAPID URINE DRUG SCREEN, HOSP PERFORMED    EKG None  Radiology No results found.  Procedures Procedures (including critical care time)  Medications Ordered in ED Medications - No data to display  ED Course  I have reviewed the triage vital signs and the nursing notes.  Pertinent labs & imaging results that were available during my care of the patient were reviewed by me and considered in my medical decision making (see chart for details).    MDM Rules/Calculators/A&P                          Patient with history of paranoid schizophrenia who feels the symptoms are worsening with recent changes  in his medications.  Having difficulty sleeping, paranoia regarding possibly being poisoned by unknown persons.  Sleep disturbance with patient feeling presence of strangers in his home, yet denies visual or auditory hallucinations.  He is medically cleared, pending TTS evaluation.  Patient is here voluntarily, he has no homicidal or suicidal ideation and is very cooperative during this visit.  He may not meet inpatient criteria, but may benefit from evaluation of his medications as he has had significant changes in his medicines since his recent inpatient psych admission. Final Clinical Impression(s) / ED Diagnoses Final diagnoses:  Paranoid schizophrenia [facility])  Sleep disturbance    Rx / DC Orders ED Discharge Orders    None       Victoriano Lain 01/10/20 2243    Raeford Razor, MD 01/12/20 [phone]

## 2020-01-11 DIAGNOSIS — F200 Paranoid schizophrenia: Secondary | ICD-10-CM | POA: Diagnosis not present
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
## 2020-01-11 NOTE — Discharge Instructions (Signed)
Follow up at [facility] as needed. Don't drink so many energy drinks so close together!

## 2020-01-11 NOTE — ED Notes (Signed)
Pt speaking with TTS at this time.  

## 2020-01-11 NOTE — ED Provider Notes (Signed)
Patient has been assessed by Berna Spare, TTS.  They do not feel patient meets inpatient criteria.  Patient states he drinks 6 energy drinks because "I needed energy".  He denies trying to harm himself.  Patient is awake and pleasant and states he feels ready to be discharged.  He has been ambulatory in the ED without any difficulty.  He does not appear to be having any hallucinations.  He makes good eye contact and actually states he remembers seeing me before.   Devoria Albe, MD 01/11/20 [phone]

## 2020-01-27 DIAGNOSIS — Z720 Tobacco use: Secondary | ICD-10-CM | POA: Diagnosis not present

## 2020-01-27 DIAGNOSIS — F200 Paranoid schizophrenia: Secondary | ICD-10-CM | POA: Diagnosis not present

## 2020-01-27 DIAGNOSIS — E7849 Other hyperlipidemia: Secondary | ICD-10-CM | POA: Diagnosis not present

## 2020-01-31 DIAGNOSIS — Z6837 Body mass index (BMI) 37.0-37.9, adult: Secondary | ICD-10-CM | POA: Diagnosis not present

## 2020-01-31 DIAGNOSIS — I10 Essential (primary) hypertension: Secondary | ICD-10-CM | POA: Diagnosis not present

## 2020-01-31 DIAGNOSIS — E785 Hyperlipidemia, unspecified: Secondary | ICD-10-CM | POA: Diagnosis not present

## 2020-02-01 ENCOUNTER — Encounter (HOSPITAL_COMMUNITY): Payer: Self-pay | Admitting: Emergency Medicine

## 2020-02-01 ENCOUNTER — Emergency Department (HOSPITAL_COMMUNITY)
Admission: EM | Admit: 2020-02-01 | Discharge: 2020-02-02 | Disposition: A | Discharge status: Home / Self Care | Payer: Medicare HMO | Attending: Emergency Medicine | Admitting: Emergency Medicine

## 2020-02-01 ENCOUNTER — Other Ambulatory Visit: Payer: Self-pay

## 2020-02-01 DIAGNOSIS — F200 Paranoid schizophrenia: Secondary | ICD-10-CM | POA: Insufficient documentation

## 2020-02-01 DIAGNOSIS — F17210 Nicotine dependence, cigarettes, uncomplicated: Secondary | ICD-10-CM | POA: Diagnosis not present

## 2020-02-01 DIAGNOSIS — Z20822 Contact with and (suspected) exposure to covid-19: Secondary | ICD-10-CM | POA: Insufficient documentation

## 2020-02-01 DIAGNOSIS — Z79899 Other long term (current) drug therapy: Secondary | ICD-10-CM | POA: Insufficient documentation

## 2020-02-01 DIAGNOSIS — R443 Hallucinations, unspecified: Secondary | ICD-10-CM

## 2020-02-01 DIAGNOSIS — F22 Delusional disorders: Secondary | ICD-10-CM

## 2020-02-01 LAB — COMPREHENSIVE METABOLIC PANEL
ALT: 34 U/L (ref 0–44)
AST: 40 U/L (ref 15–41)
Albumin: 4.3 g/dL (ref 3.5–5.0)
Alkaline Phosphatase: 65 U/L (ref 38–126)
Anion gap: 10 (ref 5–15)
BUN: 15 mg/dL (ref 6–20)
CO2: 23 mmol/L (ref 22–32)
Calcium: 9.1 mg/dL (ref 8.9–10.3)
Chloride: 104 mmol/L (ref 98–111)
Creatinine, Ser: 1.04 mg/dL (ref 0.61–1.24)
GFR calc non Af Amer: >60 mL/min (ref >60)
Glucose, Bld: 99 mg/dL (ref 70–99)
Potassium: 3.6 mmol/L (ref 3.5–5.1)
Sodium: 137 mmol/L (ref 135–145)
Total Bilirubin: 0.6 mg/dL (ref 0.3–1.2)
Total Protein: 7 g/dL (ref 6.5–8.1)

## 2020-02-01 LAB — RAPID URINE DRUG SCREEN, HOSP PERFORMED
Amphetamines: NOT DETECTED
Barbiturates: NOT DETECTED
Benzodiazepines: NOT DETECTED
Cocaine: NOT DETECTED
Opiates: NOT DETECTED
Tetrahydrocannabinol: NOT DETECTED

## 2020-02-01 LAB — CBC
HCT: 40.2 % (ref 39.0–52.0)
Hemoglobin: 13.6 g/dL (ref 13.0–17.0)
MCH: 30 pg (ref 26.0–34.0)
MCHC: 33.8 g/dL (ref 30.0–36.0)
MCV: 88.5 fL (ref 80.0–100.0)
Platelets: 333 10*3/uL (ref 150–400)
RBC: 4.54 MIL/uL (ref 4.22–5.81)
RDW: 11.9 % (ref 11.5–15.5)
WBC: 8.5 10*3/uL (ref 4.0–10.5)
nRBC: 0 % (ref 0.0–0.2)

## 2020-02-01 LAB — ACETAMINOPHEN LEVEL: Acetaminophen (Tylenol), Serum: <10 ug/mL — ABNORMAL LOW (ref 10–30)

## 2020-02-01 LAB — ETHANOL: Alcohol, Ethyl (B): <10 mg/dL (ref <10)

## 2020-02-01 LAB — SALICYLATE LEVEL: Salicylate Lvl: <7 mg/dL — ABNORMAL LOW (ref 7.0–30.0)

## 2020-02-01 NOTE — ED Triage Notes (Signed)
Pt presents with [employer] and IVC paperwork initiated by his mother. The pt has a history of paranoid schizophrenia and his mother states he has not been taking his medications. Patient states his apartment is haunted and he tells his mother he is hearing voices and seeing things. Denies HI/SI

## 2020-02-01 NOTE — ED Notes (Signed)
ED Provider at bedside. 

## 2020-02-01 NOTE — ED Provider Notes (Signed)
[facility] EMERGENCY DEPARTMENT Provider Note   CSN: [id number] Arrival date & time: 02/01/20  1758     History Chief Complaint  Patient presents with  . IVC    [name] is a 44 y.o. male.  [name] is a 45 y.o. maleWith a history of paranoid schizophrenia and bipolar disorder, who presents via Science writer under IVC by his mother. Per IVC paperwork patient has history of paranoid schizophrenia and has not been taking his medications. Patient thinks that his apartment is haunted and has been telling his mother that he is hearing voices and seeing things in the apartment. Patient is convinced that his apartment building was [place] over [employer]. The mother reports that one point patient threw his bed and all of his clothing out of the apartment because he was worried it was haunted. Patient states that he hears voices only when he is inside the apartment but when asked what he hears he will not elaborate. Patient denies HI or SI. He states that he sometimes sees things on the floor of the apartment that are not his. 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 Mother is worried that he is a danger to himself or others. He has not been sleeping and has been drinking energy drinks to stay up. Patient currently denies any medical complaints, no fevers or recent illness, no chest pain, shortness of breath or abdominal pain. He does report some occasional pain in his ankle from walking around so much, denies any injury and states this pain always resolved with ibuprofen and is not new. Patient states he had an injection on Friday that he is not sure what is for but has not been taking any other medications aside from ibuprofen.        Past Medical History:  Diagnosis Date  . Bipolar disorder (HCC)   . Bronchitis   . Hyperlipidemia     . Paranoid schizophrenia (HCC)   . Schizophrenia [facility])     Patient Active Problem List   Diagnosis Date Noted  . Paranoid schizophrenia (HCC) 05/31/2019  . Tobacco use disorder   . Schizoaffective disorder, bipolar type (HCC) 06/29/2014    Past Surgical History:  Procedure Laterality Date  . TESTICLE SURGERY         Family History  Problem Relation Age of Onset  . Schizophrenia Neg Hx     Social History   Tobacco Use  . Smoking status: Current Every Day Smoker    Packs/day: 1.00    Types: Cigarettes  . Smokeless tobacco: Never Used  Vaping Use  . Vaping Use: Never used  Substance Use Topics  . Alcohol use: Not Currently    Comment: weekly  . Drug use: Not Currently    Types: Marijuana    Home Medications Prior to Admission medications   Medication Sig Start Date End Date Taking? Authorizing Provider  ABILIFY MAINTENA 400 MG PRSY prefilled syringe Inject 400 mg into the muscle every 28 (twenty-eight) days. Last fill 11/02/2019 DS 28 03/11/18   [provider]  benztropine (COGENTIN) 1 MG tablet Take 1 tablet (1 mg total) by mouth 2 (two) times daily. 06/03/19 06/02/20  Malvin Johns, MD  buPROPion (WELLBUTRIN XL) 150 MG 24 hr tablet Take 150 mg by mouth daily.     [provider]  diphenhydrAMINE (BENADRYL) 25 MG tablet Take 1 tablet (25 mg total) by mouth every 6 (six) hours as needed for itching. 07/31/19   Burgess Amor, PA-C  fluPHENAZine (PROLIXIN) 10 MG tablet Take 2 tablets (20 mg total) by mouth at bedtime. 06/03/19   Malvin Johns, MD  hydrOXYzine (ATARAX/VISTARIL) 50 MG tablet Take 50 mg by mouth 3 (three) times daily as needed for anxiety.     [provider]  topiramate (TOPAMAX) 50 MG tablet Take 1 tablet (50 mg total) by mouth 2 (two) times daily. 06/03/19 06/02/20  Malvin Johns, MD  traZODone (DESYREL) 50 MG tablet Take 1 tablet (50 mg total) by mouth at bedtime. 06/03/19   Malvin Johns, MD  triamcinolone cream (KENALOG) 0.1 % Apply 1  application topically daily as needed (eczema).  10/13/18   [provider]    Allergies    Other  Review of Systems   Review of Systems  Constitutional: Negative for chills and fever.  HENT: Negative.   Eyes: Negative for visual disturbance.  Respiratory: Negative for cough and shortness of breath.   Cardiovascular: Negative for chest pain.  Gastrointestinal: Negative for abdominal pain, nausea and vomiting.  Musculoskeletal: Negative for arthralgias and myalgias.  Skin: Negative for wound.  Neurological: Negative for headaches.  Psychiatric/Behavioral: Positive for hallucinations. Negative for suicidal ideas.  All other systems reviewed and are negative.   Physical Exam Updated Vital Signs BP (!) 141/89 (BP Location: Right Arm)   Pulse 84   Temp 99.5 F (37.5 C) (Oral)   Resp 17   Ht 6\' 1"  (1.854 m)   Wt 123.8 kg   SpO2 100%   BMI 36.02 kg/m   Physical Exam Vitals and nursing note reviewed.  Constitutional:      General: He is not in acute distress.    Appearance: Normal appearance. He is well-developed. He is not diaphoretic.     Comments: Calm and in no acute distress  HENT:     Head: Normocephalic and atraumatic.  Eyes:     General:        Right eye: No discharge.        Left eye: No discharge.     Pupils: Pupils are equal, round, and reactive to light.  Cardiovascular:     Rate and Rhythm: Normal rate and regular rhythm.     Heart sounds: Normal heart sounds.  Pulmonary:     Effort: Pulmonary effort is normal. No respiratory distress.     Breath sounds: Normal breath sounds. No wheezing or rales.  Abdominal:     General: Bowel sounds are normal. There is no distension.     Palpations: Abdomen is soft. There is no mass.     Tenderness: There is no abdominal tenderness. There is no guarding.  Musculoskeletal:        General: No deformity.     Cervical back: Neck supple.     Comments: No swelling, deformity or tenderness noted over the left ankle   Skin:    General: Skin is warm and dry.     Capillary Refill: Capillary refill takes less than 2 seconds.  Neurological:     Mental Status: He is alert.     Coordination: Coordination normal.     Comments: Speech is clear, able to follow commands Moves extremities without ataxia, coordination intact  Psychiatric:        Attention and Perception: He perceives auditory and visual hallucinations.        Mood and  Affect: Affect is flat.        Speech: Speech normal.        Behavior: Behavior is withdrawn. Behavior is cooperative.        Thought Content: Thought content is paranoid and delusional. Thought content does not include homicidal or suicidal ideation.     ED Results / Procedures / Treatments   Labs (all labs ordered are listed, but only abnormal results are displayed) Labs Reviewed  SALICYLATE LEVEL - Abnormal; Notable for the following components:      Result Value   Salicylate Lvl <7.0 (*)    All other components within normal limits  ACETAMINOPHEN LEVEL - Abnormal; Notable for the following components:   Acetaminophen (Tylenol), Serum <10 (*)    All other components within normal limits  RESPIRATORY PANEL BY RT PCR (FLU A&B, COVID)  COMPREHENSIVE METABOLIC PANEL  ETHANOL  CBC  RAPID URINE DRUG SCREEN, HOSP PERFORMED    EKG None  Radiology No results found.  Procedures Procedures (including critical care time)  Medications Ordered in ED Medications - No data to display  ED Course  I have reviewed the triage vital signs and the nursing notes.  Pertinent labs & imaging results that were available during my care of the patient were reviewed by me and considered in my medical decision making (see chart for details).    MDM Rules/Calculators/A&P                         Patient presents under IVC from his mother with concern that he may be a danger to himself and others, patient has become paranoid and insists that his apartment is haunted. Mother reports  that at one point he removed his bed and closed from the apartment because he felt like he could not stay there. Patient spends most of the day walking around because he does not want to go into his apartment. He hears voices while he is in there and sees things. Denies HI or SI. Has not been sleeping because of this and was banned from the [employer] for following employees around. First exam completed to uphold IVC. Patient without focal medical complaints and has stable vitals. Will get medical screening labs and place TTS consult.  Screening labs have been reviewed and interpreted by myself, no concerning electrolyte derangements, no leukocytosis and tox screen is clear, UDS and Covid screening are still pending. At this time patient is medically cleared pending TTS evaluation. He has not been taking any of his psychiatric medications for an unknown period of time so we will hold off on restarting these medications.  The patient has been placed in psychiatric observation due to the need to provide a safe environment for the patient while obtaining psychiatric consultation and evaluation, as well as ongoing medical and medication management to treat the patient's condition.  The patient has been placed under full IVC at this time.  Final Clinical Impression(s) / ED Diagnoses Final diagnoses:  Hallucinations  Paranoia [facility])    Rx / DC Orders ED Discharge Orders    None       Legrand Rams 02/01/20 2007    Eber Hong, MD 02/01/20 2333

## 2020-02-01 NOTE — BH Assessment (Signed)
Comprehensive Clinical Assessment (CCA) Note  02/01/2020 [name] [id number]   Phat reports to [facility] ED via [employer].  Pts mother has initiated the IVC process due to pts medication noncompliance. Pts mother states that pt has been following people around [employer] and is seeing/hearing "ghosts" in his apartment. Pt denies SI, HI, or any AVH at time of assessment. Pt denies any of the allegations and reports that the only reason he is in the ED is because he didn't sleep last night.  Weyman Pedro, MSW, LCSW Outpatient Therapist/Triage Specialist   Disposition: Per [name], NP pt meets inpatient criteria  Visit Diagnosis:      ICD-10-CM   1. Hallucinations  R44.3   2. Paranoia (HCC)  F22       CCA Screening, Triage and Referral (STR)  Patient Reported Information How did you hear about [place]? No data recorded Referral name: No data recorded Referral phone number: No data recorded  Whom do you see for routine medical problems? No data recorded Practice/Facility Name: No data recorded Practice/Facility Phone Number: No data recorded Name of Contact: No data recorded Contact Number: No data recorded Contact Fax Number: No data recorded Prescriber Name: No data recorded Prescriber Address (if known): No data recorded  What Is the Reason for Your Visit/Call Today? psychosis  How Long Has This Been Causing You Problems? <Week  What Do You Feel Would Help You the Most Today? Assessment Only;Medication;Therapy   Have You Recently Been in Any Inpatient Treatment (Hospital/Detox/Crisis Center/28-Day Program)? Yes  Name/Location of Program/Hospital:[facility]  How Long Were You There? No data recorded When Were You Discharged? No data recorded  Have You Ever Received Services From [facility] Before? Yes  Who Do You See at [facility]? ED   Have You Recently Had Any Thoughts About Hurting Yourself? No  Are You Planning to Commit Suicide/Harm Yourself At This  time? No   Have you Recently Had Thoughts About Hurting Someone Karolee Ohs? No  Explanation: No data recorded  Have You Used Any Alcohol or Drugs in the Past 24 Hours? No  How Long Ago Did You Use Drugs or Alcohol? No data recorded What Did You Use and How Much? No data recorded  Do You Currently Have a Therapist/Psychiatrist? No  Name of Therapist/Psychiatrist: No data recorded  Have You Been Recently Discharged From Any Office Practice or Programs? No  Explanation of Discharge From Practice/Program: No data recorded    CCA Screening Triage Referral Assessment Type of Contact: Tele-Assessment  Is this Initial or Reassessment? Initial Assessment  Date Telepsych consult ordered in [facility]:  02/01/20  Time Telepsych consult ordered in [facility]:  2105   Patient Reported Information Reviewed? Yes  Patient Left Without Being Seen? No data recorded Reason for Not Completing Assessment: No data recorded  Collateral Involvement: none   Does Patient Have a Court Appointed Legal Guardian? No data recorded Name and Contact of Legal Guardian: self  If Minor and Not Living with Parent(s), Who has Custody? No data recorded Is CPS involved or ever been involved? No data recorded Is APS involved or ever been involved? No data recorded  Patient Determined To Be At Risk for Harm To Self or Others Based on Review of Patient Reported Information or Presenting Complaint? No data recorded Method: No data recorded Availability of Means: No data recorded Intent: No data recorded Notification Required: No data recorded Additional Information for Danger to Others Potential: No data recorded Additional Comments for Danger  to Others Potential: No data recorded Are There Guns or Other Weapons in Your Home? No  Types of Guns/Weapons: No data recorded Are These Weapons Safely Secured?                            Yes  Who Could Verify You Are Able To Have These Secured: No data recorded Do You Have any  Outstanding Charges, Pending Court Dates, Parole/Probation? No data recorded Contacted To Inform of Risk of Harm To Self or Others: No data recorded  Location of Assessment: [facility]   Does Patient Present under Involuntary Commitment? Yes (Mother in process--completing paperwork)  IVC Papers Initial File Date: No data recorded  [place] of Residence: [place]   Patient Currently Receiving the Following Services: Medication Management   Determination of Need: No data recorded  Options For Referral: Inpatient Hospitalization  CCA Biopsychosocial  Intake/Chief Complaint:  CCA Intake With Chief Complaint CCA Part Two Date: 02/01/20 CCA Part Two Time: 1925 Chief Complaint/Presenting Problem: [name] reports to [facility] ED via [facility].  Pts mother has initiated the IVC process due to pts medication noncompliance. Pts mother states that pt has been following people around [employer] and is seeing/hearing "ghosts" in his apartment. Pt denies SI, HI, or any AVH at time of assessment. Pt denies any of the allegations and reports that the only reason he is in the ED is because he didn't sleep last night.  Mental Health Symptoms Depression:     Mania:     Anxiety:      Psychosis:     Trauma:     Obsessions:     Compulsions:     Inattention:     Hyperactivity/Impulsivity:     Oppositional/Defiant Behaviors:     Emotional Irregularity:     Other Mood/Personality Symptoms:      Mental Status Exam Appearance and self-care  Stature:  Stature: Average  Weight:  Weight: Overweight  Clothing:  Clothing: Casual  Grooming:  Grooming: Normal  Cosmetic use:  Cosmetic Use: None  Posture/gait:  Posture/Gait: Rigid  Motor activity:  Motor Activity: Not Remarkable  Sensorium  Attention:  Attention: Distractible  Concentration:  Concentration: Scattered  Orientation:  Orientation: Person, Place  Recall/memory:  Recall/Memory: Defective in Remote, Defective in Recent, Defective in Short-term,  Defective in Immediate  Affect and Mood  Affect:  Affect: Blunted, Depressed, Flat  Mood:     Relating  Eye contact:  Eye Contact: None  Facial expression:     Attitude toward examiner:  Attitude Toward Examiner: Guarded, Resistant  Thought and Language  Speech flow: Speech Flow: Soft, Slow  Thought content:  Thought Content: Appropriate to Mood and Circumstances  Preoccupation:  Preoccupations: None  Hallucinations:  Hallucinations: None (pt denied at time of assessment)  Organization:     Company secretary of Knowledge:  Fund of Knowledge: Fair  Intelligence:  Intelligence: Below average  Abstraction:     Judgement:  Judgement: Impaired  Reality Testing:  Reality Testing: Distorted  Insight:  Insight: Gaps  Decision Making:  Decision Making: Impulsive  Social Functioning  Social Maturity:  Social Maturity: Irresponsible  Social Judgement:  Social Judgement: Normal  Stress  Stressors:  Stressors:  Industrial/product designer)  Coping Ability:     Skill Deficits:  Skill Deficits: Interpersonal, Decision making, Communication, Responsibility, Activities of daily living  Supports:  Supports: Family    Exercise/Diet: Exercise/Diet Do You Exercise?: Yes What Type of  Exercise Do You Do?: Run/Walk How Many Times a Week Do You Exercise?: 4-5 times a week Do You Follow a Special Diet?: No Do You Have Any Trouble Sleeping?: Yes Explanation of Sleeping Difficulties: insomnia--drinks lots of energy drinks and stays up for days   CCA Employment/Education  Employment/Work Situation: Employment / Work Situation Employment situation: On disability Patient's job has been impacted by current illness: No What is the longest time patient has a held a job?: 1 1/2 year Where was the patient employed at that time?: [employer]  Has patient ever been in the [employer]?: No  CCA Family/Childhood History  Family and Relationship History: Family history Marital status: Single Are you sexually active?:  No What is your sexual orientation?: Heterosexual Has your sexual activity been affected by drugs, alcohol, medication, or emotional stress?: No Does patient have children?: No  Childhood History:  Childhood History By whom was/is the patient raised?: Both parents Additional childhood history information: Parents divorced when pt was in 4th grade.   Description of patient's relationship with caregiver when they were a child: "I used to be on my own."   Did patient suffer any verbal/emotional/physical/sexual abuse as a child?: No Has patient ever been sexually abused/assaulted/raped as an adolescent or adult?: No Witnessed domestic violence?: No Has patient been affected by domestic violence as an adult?: No  CCA Substance Use  Alcohol/Drug Use: Alcohol / Drug Use History of alcohol / drug use?: Yes Substance #1 Name of Substance 1: Tobacco 1 - Age of First Use: unknown 1 - Amount (size/oz): pack per day 1 - Frequency: daily use 1 - Duration: ongoing 1 - Last Use / Amount: unknown     DSM5 Diagnoses: Patient Active Problem List   Diagnosis Date Noted  . Schizophrenia, paranoid (HCC) 05/31/2019  . Tobacco use disorder   . Schizoaffective disorder, bipolar type (HCC) 06/29/2014   Disposition: Per [name], NP pt meets inpatient criteria

## 2020-02-01 NOTE — ED Notes (Signed)
Security at bedside to wand patient and personal belongings.

## 2020-02-02 ENCOUNTER — Encounter (HOSPITAL_COMMUNITY): Payer: Self-pay | Admitting: Nurse Practitioner

## 2020-02-02 ENCOUNTER — Inpatient Hospital Stay (HOSPITAL_COMMUNITY)
Admission: AD | Admit: 2020-02-02 | Discharge: 2020-02-08 | DRG: 885 | Disposition: A | Discharge status: Home / Self Care | Payer: Medicare HMO | Attending: Psychiatry | Admitting: Psychiatry

## 2020-02-02 DIAGNOSIS — G4700 Insomnia, unspecified: Secondary | ICD-10-CM | POA: Diagnosis present

## 2020-02-02 DIAGNOSIS — Z79899 Other long term (current) drug therapy: Secondary | ICD-10-CM

## 2020-02-02 DIAGNOSIS — Z9114 Patient's other noncompliance with medication regimen: Secondary | ICD-10-CM

## 2020-02-02 DIAGNOSIS — Z20822 Contact with and (suspected) exposure to covid-19: Secondary | ICD-10-CM | POA: Diagnosis present

## 2020-02-02 DIAGNOSIS — E785 Hyperlipidemia, unspecified: Secondary | ICD-10-CM | POA: Diagnosis not present

## 2020-02-02 DIAGNOSIS — F17210 Nicotine dependence, cigarettes, uncomplicated: Secondary | ICD-10-CM | POA: Diagnosis present

## 2020-02-02 DIAGNOSIS — F200 Paranoid schizophrenia: Principal | ICD-10-CM | POA: Diagnosis present

## 2020-02-02 LAB — RESPIRATORY PANEL BY RT PCR (FLU A&B, COVID)
Influenza A by PCR: NEGATIVE
Influenza B by PCR: NEGATIVE
SARS Coronavirus 2 by RT PCR: NEGATIVE

## 2020-02-02 MED ORDER — HALOPERIDOL 5 MG PO TABS: 5.0000 mg | ORAL_TABLET | Freq: Four times a day (QID) | ORAL | Status: DC | PRN

## 2020-02-02 MED ORDER — HALOPERIDOL LACTATE 5 MG/ML IJ SOLN: 10.0000 mg | Freq: Four times a day (QID) | INTRAMUSCULAR | Status: DC | PRN

## 2020-02-02 MED ORDER — MAGNESIUM HYDROXIDE 400 MG/5ML PO SUSP: 30.0000 mL | Freq: Every day | ORAL | Status: DC | PRN

## 2020-02-02 MED ORDER — TRAZODONE HCL 50 MG PO TABS
50.0000 mg | ORAL_TABLET | Freq: Every evening | ORAL | Status: DC | PRN
  Filled 2020-02-02: qty 1

## 2020-02-02 MED ORDER — LORAZEPAM 2 MG/ML IJ SOLN: 2.0000 mg | Freq: Four times a day (QID) | INTRAMUSCULAR | Status: DC | PRN

## 2020-02-02 MED ORDER — LORAZEPAM 1 MG PO TABS
1.0000 mg | ORAL_TABLET | Freq: Four times a day (QID) | ORAL | Status: DC | PRN
  Administered 2020-02-04 – 2020-02-06 (×2): 1 mg via ORAL
  Filled 2020-02-02 (×2): qty 1

## 2020-02-02 MED ORDER — HYDROXYZINE HCL 25 MG PO TABS
25.0000 mg | ORAL_TABLET | Freq: Three times a day (TID) | ORAL | Status: DC | PRN
  Administered 2020-02-02 – 2020-02-06 (×4): 25 mg via ORAL
  Filled 2020-02-02 (×4): qty 1

## 2020-02-02 MED ORDER — ACETAMINOPHEN 325 MG PO TABS
650.0000 mg | ORAL_TABLET | Freq: Four times a day (QID) | ORAL | Status: DC | PRN
  Administered 2020-02-04 – 2020-02-07 (×3): 650 mg via ORAL
  Filled 2020-02-02 (×3): qty 2

## 2020-02-02 MED ORDER — LORAZEPAM 2 MG/ML IJ SOLN: 1.0000 mg | Freq: Four times a day (QID) | INTRAMUSCULAR | Status: DC | PRN

## 2020-02-02 MED ORDER — BENZTROPINE MESYLATE 1 MG PO TABS: 0.5000 mg | ORAL_TABLET | Freq: Four times a day (QID) | ORAL | Status: DC | PRN

## 2020-02-02 MED ORDER — HALOPERIDOL LACTATE 5 MG/ML IJ SOLN: 5.0000 mg | Freq: Four times a day (QID) | INTRAMUSCULAR | Status: DC | PRN

## 2020-02-02 MED ORDER — ALUM & MAG HYDROXIDE-SIMETH 200-200-20 MG/5ML PO SUSP
30.0000 mL | ORAL | Status: DC | PRN
  Administered 2020-02-02 – 2020-02-07 (×2): 30 mL via ORAL
  Filled 2020-02-02 (×2): qty 30

## 2020-02-02 MED ORDER — BENZTROPINE MESYLATE 0.5 MG PO TABS: 0.5000 mg | ORAL_TABLET | Freq: Four times a day (QID) | ORAL | Status: DC | PRN

## 2020-02-02 MED ORDER — HALOPERIDOL 5 MG PO TABS
10.0000 mg | ORAL_TABLET | Freq: Four times a day (QID) | ORAL | Status: DC | PRN
  Administered 2020-02-04 (×2): 10 mg via ORAL
  Filled 2020-02-02 (×2): qty 2

## 2020-02-02 MED ORDER — FLUPHENAZINE HCL 10 MG PO TABS
10.0000 mg | ORAL_TABLET | Freq: Two times a day (BID) | ORAL | Status: DC
  Administered 2020-02-02: 10 mg via ORAL
  Filled 2020-02-02 (×5): qty 1

## 2020-02-02 MED ORDER — BENZTROPINE MESYLATE 1 MG/ML IJ SOLN: 0.5000 mg | Freq: Four times a day (QID) | INTRAMUSCULAR | Status: DC | PRN

## 2020-02-02 MED ORDER — LORAZEPAM 1 MG PO TABS: 1.0000 mg | ORAL_TABLET | Freq: Four times a day (QID) | ORAL | Status: DC | PRN

## 2020-02-02 NOTE — BHH Counselor (Signed)
Adult Comprehensive Assessment  Patient ID: [name], male   DOB: 04/07/1976, 44 y.o.   MRN: [id number]   Information Source: Information source: Patient  Current Stressors:  Patient states their primary concerns and needs for treatment are:: "They thought I had Covid" Patient states their goals for this hospitilization and ongoing recovery are: "To go home" Educational / Learning stressors: Pt denies stressors Employment / Job issues: Pt denies stressor. Patient is currently unemployed and receives a disability check. Family Relationships: Pt reports having few family relationships. Financial / Lack of resources (include bankruptcy): Pt denies stressor Housing / Lack of housing: Pt reports that he lives alone in his own apartment  Physical health (include injuries & life threatening diseases): Pt denies stressors Social relationships: Pt reports few social relationships Substance abuse: Patient denies all substance use Bereavement / Loss: Pt denies stressors  Living/Environment/Situation:  Living Arrangements: Alone Living conditions (as described by patient or guardian): "It's alright" Who else lives in the home?: Patient lives alone in an apartment How long has patient lived in current situation?: 10 years What is atmosphere in current home: Comfortable  Family History:  Marital status: Single Are you sexually active?: No What is your sexual orientation?: Heterosexual Has your sexual activity been affected by drugs, alcohol, medication, or emotional stress?: No Does patient have children?: No  Childhood History:  By whom was/is the patient raised?: Both parents Additional childhood history information: Parents divorced when pt was in 4th grade.   Description of patient's relationship with caregiver when they were a child: "I used to be on my own."   Patient's description of current relationship with people who raised him/her: Parents - "It's okay. Sometimes my mom  looks at me weird" Does patient have siblings?: Yes Number of Siblings: 3 Description of patient's current relationship with siblings: 3 brothers - "It's okay."   Did patient suffer any verbal/emotional/physical/sexual abuse as a child?: No Did patient suffer from severe childhood neglect?: No Has patient ever been sexually abused/assaulted/raped as an adolescent or adult?: No Was the patient ever a victim of a crime or a disaster?: No Witnessed domestic violence?: No Has patient been effected by domestic violence as an adult?: No  Education:  Highest grade of school patient has completed: Some college Currently a Consulting civil engineer?: No Learning disability?: No  Employment/Work Situation:   Employment situation: On disability Why is patient on disability: Schizophrenia  How long has patient been on disability: 59 Patient's job has been impacted by current illness: No What is the longest time patient has a held a job?: 1 1/2 year Where was the patient employed at that time?: [employer]  Did You Receive Any Psychiatric Treatment/Services While in the [employer]?: No Are There Guns or Other Weapons in Your Home?: No Are These Weapons Safely Secured?: Yes  Financial Resources:   Financial resources: Insurance claims handler, Support from parents / caregiver, Medicare Does patient have a Lawyer or guardian?: Yes Name of representative payee or guardian: Patient reports that his payee is finanical pathway through [facility]  Alcohol/Substance Abuse:   What has been your use of drugs/alcohol within the last 12 months?: Pt denies all substance use and states that he has not drank alcohol in 20 years If attempted suicide, did drugs/alcohol play a role in this?: No Alcohol/Substance Abuse Treatment Hx: Denies past history Has alcohol/substance abuse ever caused legal problems?: No  Social Support System:   Patient's Community Support System: Poor Describe Community Support System: "I got  a few friends but I don't really got a support system" Type of faith/religion: "I don't want to talk about religion right now" How does patient's faith help to cope with current illness?: None.  Leisure/Recreation:   Leisure and Hobbies: Video games.   Strengths/Needs:   What is the patient's perception of their strengths?: Basketball and washing cars Patient states these barriers may affect/interfere with their treatment: N/A Patient states these barriers may affect their return to the community: N/A Other important information patient would like considered in planning for their treatment: N/A  Discharge Plan:   Currently receiving community mental health services: Yes (From Whom)([facility] ACTT team) Patient states concerns and preferences for aftercare planning are: Patient reports he doesn't want to continue with his ACTT team because one member is gay. Patient states they will know when they are safe and ready for discharge when: Pt did not respond Does patient have access to transportation?: Yes, family Does patient have financial barriers related to discharge medications?: No Plan for no access to transportation at discharge: N/A Will patient be returning to same living situation after discharge?: Yes  Summary/Recommendations:   Summary and Recommendations (to be completed by the evaluator): Patient is a 44 year old male who presented to the hospital due to [facility] and paranoia.  The Pt reports that he lives alone in an apartment and that the condition of the apartment is not healthy but he does not wish to move at this time.  Pt reports that he has an Investment banker, operational but does not wish to continue working with them due to an ACTT team member who he states is gay.  The Pt was suspicious during the assessment but did answer most questions.  While in the hospital the Pt can benefit from crisis stabilization, medication evaluation, group therapy, psycho-education, case management, and discharge  planning.  Upon discharge the Pt would like to return to his apartment and follow up with another agency other than the [facility] in [place].  The Pt has stated that he is not interested in meeting with or talking to his ACTT team at this time.       [name]. 02/02/2020

## 2020-02-02 NOTE — BHH Counselor (Signed)
CSW spoke to [name] at [employer] [facility] and verified that [name] does have a Payee through this agency.  [name] states that all of [name] medical bills can be sent to the agency and addressed to her. The Payee is [name]. The address is [address] [place], [place], [place], [place] [address].  Phone number is [phone].

## 2020-02-02 NOTE — BHH Suicide Risk Assessment (Signed)
[facility] Admission Suicide Risk Assessment   Nursing information obtained from:  Patient Demographic factors:  Male, Low socioeconomic status, Living alone Current Mental Status:  NA Loss Factors:  NA Historical Factors:  Impulsivity Risk Reduction Factors:  NA  Total Time spent with patient: 30 minutes Principal Problem: <principal problem not specified> Diagnosis:  Active Problems:   Paranoid schizophrenia (HCC)  Subjective Data: Patient is seen and examined.  Patient is a 44 year old male with a past psychiatric history significant for schizophrenia who was placed under involuntary commitment by his mother and transported by police to the [facility] emergency room on 02/01/2020.  His mother stated that the patient had not been taking his medications.  The patient stated that his apartment was haunted, and that he was hearing voices and seeing things.  He also told the comprehensive clinical assessment team that he had been being followed by people around [employer], and was seeing and hearing "ghost" in his apartment.  He denied any suicidal or homicidal ideation.  He stated the reason why he was in the hospital today was because he was "trying to find a job".  He was very vague about taking his medications.  He stated "I got my injection on Friday".  He was unable to recall whether or not he was taking any other medications.  He was not a good historian.  Review of the electronic medical record revealed his last psychiatric hospitalization in July of this year at [facility]  Prior to that hospitalization he was being treated with the long-acting Abilify injection, Prolixin, Topamax.  On discharge from their facility he was started on the long-acting paliperidone injection.  He received 234 mg on 12/08/2019.  The Abilify, Prolixin and Topamax were all stopped.  We are unsure whether or not the injection he received this previous Friday was paliperidone, Abilify or some other medication.  He was  admitted to the hospital for evaluation and stabilization.  Continued Clinical Symptoms:    The "Alcohol Use Disorders Identification Test", Guidelines for Use in Primary Care, Second Edition.  World Science writer [facility]). Score between 0-7:  no or low risk or alcohol related problems. Score between 8-15:  moderate risk of alcohol related problems. Score between 16-19:  high risk of alcohol related problems. Score 20 or above:  warrants further diagnostic evaluation for alcohol dependence and treatment.   CLINICAL FACTORS:   Schizophrenia:   Command hallucinatons Paranoid or undifferentiated type   Musculoskeletal: Strength & Muscle Tone: within normal limits Gait & Station: normal Patient leans: N/A  Psychiatric Specialty Exam: Physical Exam Vitals and nursing note reviewed.  Constitutional:      Appearance: Normal appearance.  HENT:     Head: Normocephalic and atraumatic.  Pulmonary:     Effort: Pulmonary effort is normal.  Neurological:     General: No focal deficit present.     Mental Status: He is alert and oriented to person, place, and time.     Review of Systems  All other systems reviewed and are negative.   Blood pressure 130/76, pulse 62, temperature 98.5 F (36.9 C), temperature source Oral, resp. rate 18, height 6' (1.829 m), weight 107.5 kg, SpO2 100 %.Body mass index is 32.14 kg/m.  General Appearance: Fairly Groomed  Eye Contact:  Fair  Speech:  Normal Rate  Volume:  Normal  Mood:  Dysphoric  Affect:  Flat  Thought Process:  Goal Directed and Descriptions of Associations: Circumstantial  Orientation:  Full (Time, Place, and  Person)  Thought Content:  Delusions and Hallucinations: Auditory  Suicidal Thoughts:  No  Homicidal Thoughts:  No  Memory:  Immediate;   Fair Recent;   Fair Remote;   Fair  Judgement:  Intact  Insight:  Lacking  Psychomotor Activity:  Normal  Concentration:  Concentration: Fair and Attention Span: Fair  Recall:  [employer] of Knowledge:  Fair  Language:  Fair  Akathisia:  Negative  Handed:  Right  AIMS (if indicated):     Assets:  Desire for Improvement Resilience  ADL's:  Intact  Cognition:  WNL  Sleep:         COGNITIVE FEATURES THAT CONTRIBUTE TO RISK:  None    SUICIDE RISK:   Mild:  Suicidal ideation of limited frequency, intensity, duration, and specificity.  There are no identifiable plans, no associated intent, mild dysphoria and related symptoms, good self-control (both objective and subjective assessment), few other risk factors, and identifiable protective factors, including available and accessible social support.  PLAN OF CARE: Patient is seen and examined.  Patient is a 44 year old male with the above-stated past psychiatric history who was admitted secondary to worsening psychosis.  He will be admitted to the hospital.  He will be integrated in the milieu.  He will be encouraged to attend groups.  Until we can clarify some background information from the [facility] about his current medications we will place him back on Prolixin 10 mg p.o. twice daily.  He will also have available Cogentin as needed for side effects.  There were also be available Haldol as needed for agitation.  Also have available hydroxyzine for anxiety and lorazepam for significant agitation.  He will also have available trazodone 50 mg p.o. nightly as needed insomnia.  Review of his admission laboratories revealed essentially normal electrolytes including creatinine and liver function enzymes.  His CBC was normal.  Acetaminophen was less than 10, salicylate less than 7.  His urinalysis was essentially normal.  Blood alcohol was less than 10.  Drug screen was negative.  We have ordered an EKG as well as a TSH on him today.  Currently his vital signs are stable, he is afebrile.  If necessary we will add back the amlodipine.  I certify that inpatient services furnished can reasonably be expected to improve  the patient's condition.   Antonieta Pert, MD 02/02/2020, 10:53 AM

## 2020-02-02 NOTE — Progress Notes (Signed)
Pt was c/o stomach upset. Given Maalox. Will continue to monitor.

## 2020-02-02 NOTE — Tx Team (Signed)
Initial Treatment Plan 02/02/2020 10:04 AM [name] HAF:[id number]    PATIENT STRESSORS: Financial difficulties   PATIENT STRENGTHS: Average or above average intelligence Communication skills   PATIENT IDENTIFIED PROBLEMS: Psychosis  Potential housing problem  Poor insight re mental health needs Ineffective coping skills                  DISCHARGE CRITERIA:  Ability to meet basic life and health needs Improved stabilization in mood, thinking, and/or behavior Motivation to continue treatment in a less acute level of care  PRELIMINARY DISCHARGE PLAN: Outpatient therapy Return to previous living arrangement  PATIENT/FAMILY INVOLVEMENT: This treatment plan has been presented to and reviewed with the patient, [name]. The patient and family have been given the opportunity to ask questions and make suggestions.  Cranford Mon, RN 02/02/2020, 10:04 AM

## 2020-02-02 NOTE — ED Notes (Signed)
Pt left facility with [employer]

## 2020-02-02 NOTE — H&P (Signed)
Psychiatric Admission Assessment Adult  Patient Identification: [name] MRN:  [id number] Date of Evaluation:  02/02/2020 Chief Complaint:  Paranoid schizophrenia (HCC) [F20.0] Principal Diagnosis: <principal problem not specified> Diagnosis:  ActivNichola Sizere Problems:   Paranoid schizophrenia (HCC)  History of Present Illness: Patient is seen and examined.  Patient is a 44 year old male with a past psychiatric history significant for schizophrenia who was placed under involuntary commitment by his mother and transported by police to the [facility] room on 02/01/2020.  His mother stated that the patient had not been taking his medications.  The patient stated that his apartment was haunted, and that he was hearing voices and seeing things.  He also told the comprehensive clinical assessment team that he had been being followed by people around [employer], and was seeing and hearing "ghost" in his apartment.  He denied any suicidal or homicidal ideation.  He stated the reason why he was in the hospital today was because he was "trying to find a job".  He was very vague about taking his medications.  He stated "I got my injection on Friday".  He was unable to recall whether or not he was taking any other medications.  He was not a good historian.  Review of the electronic medical record revealed his last psychiatric hospitalization in July of this year at [facility].  Prior to that hospitalization he was being treated with the long-acting Abilify injection, Prolixin, Topamax.  On discharge from their facility he was started on the long-acting paliperidone injection.  He received 234 mg on 12/08/2019.  The Abilify, Prolixin and Topamax were all stopped.  We are unsure whether or not the injection he received this previous Friday was paliperidone, Abilify or some other medication.  He was admitted to the hospital for evaluation and stabilization.  Associated Signs/Symptoms: Depression Symptoms:   insomnia, psychomotor agitation, disturbed sleep, Duration of Depression Symptoms: No data recorded (Hypo) Manic Symptoms:  Delusions, Hallucinations, Impulsivity, Irritable Mood, Labiality of Mood, Anxiety Symptoms:  Excessive Worry, Psychotic Symptoms:  Delusions, Hallucinations: Auditory Paranoia, Duration of Psychotic Symptoms: No data recorded PTSD Symptoms: Negative Total Time spent with patient: 45 minutes  Past Psychiatric History: He was apparently diagnosed with schizophrenia in 1998.  He reports at least 5-6 psychiatric hospitalizations in the past.  He has had multiple emergency department visits for psychosis.  His last psychiatric hospitalization that we are aware of in the electronic medical record was at [facility] on 11/21/2019.  He is unclear on how he got to the [facility] from our area.  At that time he was quite psychotic.  It stated in their admission note that he was well known in the [facility] emergency department.  He was seen there on 6/10 in which he was afraid of his apartment because someone was eating his food.  He is followed by ACTT service.  He has been previously treated with oral Abilify as well as long-acting Abilify injections, Wellbutrin, Cogentin, Prolixin, trazodone.  His discharge medications from [facility] included the long-acting Abilify injection.  The first dose was given on January 08, 2020.  He is also previously treated with Lipitor.  Is the patient at risk to self? No.  Has the patient been a risk to self in the past 6 months? No.  Has the patient been a risk to self within the distant past? No.  Is the patient a risk to others? No.  Has the patient been a risk to others  in the past 6 months? No.  Has the patient been a risk to others within the distant past? No.   Prior Inpatient Therapy:   Prior Outpatient Therapy:    Alcohol Screening: 1. How often do you have a drink containing alcohol?: Never 2. How many  drinks containing alcohol do you have on a typical day when you are drinking?: 1 or 2 3. How often do you have six or more drinks on one occasion?: Never AUDIT-C Score: 0 4. How often during the last year have you found that you were not able to stop drinking once you had started?: Never 5. How often during the last year have you failed to do what was normally expected from you because of drinking?: Never 6. How often during the last year have you needed a first drink in the morning to get yourself going after a heavy drinking session?: Never 7. How often during the last year have you had a feeling of guilt of remorse after drinking?: Never 8. How often during the last year have you been unable to remember what happened the night before because you had been drinking?: Never 9. Have you or someone else been injured as a result of your drinking?: No 10. Has a relative or friend or a doctor or another health worker been concerned about your drinking or suggested you cut down?: No Alcohol Use Disorder Identification Test Final Score (AUDIT): 0 Substance Abuse History in the last 12 months:  No. Consequences of Substance Abuse: Negative Previous Psychotropic Medications: Yes  Psychological Evaluations: Yes  Past Medical History:  Past Medical History:  Diagnosis Date  . Bipolar disorder (HCC)   . Bronchitis   . Hyperlipidemia   . Paranoid schizophrenia (HCC)   . Schizophrenia [facility])     Past Surgical History:  Procedure Laterality Date  . TESTICLE IMPLANTATION TO THIGH    . TESTICLE SURGERY     Family History:  Family History  Problem Relation Age of Onset  . Schizophrenia Neg Hx    Family Psychiatric  History: Noncontributory Tobacco Screening:   Social History:  Social History   Substance and Sexual Activity  Alcohol Use Not Currently   Comment: weekly     Social History   Substance and Sexual Activity  Drug Use Not Currently  . Types: Marijuana    Additional Social  History:                           Allergies:   Allergies  Allergen Reactions  . Other     Some trial medicine from [facility].    Lab Results:  Results for orders placed or performed during the hospital encounter of 02/01/20 (from the past 48 hour(s))  Comprehensive metabolic panel     Status: None   Collection Time: 02/01/20  6:33 PM  Result Value Ref Range   Sodium 137 135 - 145 mmol/L   Potassium 3.6 3.5 - 5.1 mmol/L   Chloride 104 98 - 111 mmol/L   CO2 23 22 - 32 mmol/L   Glucose, Bld 99 70 - 99 mg/dL    Comment: Glucose reference range applies only to samples taken after fasting for at least 8 hours.   BUN 15 6 - 20 mg/dL   Creatinine, Ser 1.61 0.61 - 1.24 mg/dL   Calcium 9.1 8.9 - 09.6 mg/dL   Total Protein 7.0 6.5 - 8.1 g/dL   Albumin 4.3 3.5 - 5.0 g/dL  AST 40 15 - 41 U/L   ALT 34 0 - 44 U/L   Alkaline Phosphatase 65 38 - 126 U/L   Total Bilirubin 0.6 0.3 - 1.2 mg/dL   GFR calc non Af Amer >60 >60 mL/min   Anion gap 10 5 - 15    Comment: Performed at [facility], [address], [place], [place] [address]  Ethanol     Status: None   Collection Time: 02/01/20  6:33 PM  Result Value Ref Range   Alcohol, Ethyl (B) <10 <10 mg/dL    Comment: (NOTE) Lowest detectable limit for serum alcohol is 10 mg/dL.  For medical purposes only. Performed at [facility], [address], [place], [place] [address]   Salicylate level     Status: Abnormal   Collection Time: 02/01/20  6:33 PM  Result Value Ref Range   Salicylate Lvl <7.0 (L) 7.0 - 30.0 mg/dL    Comment: Performed at [facility], [address], [place], [place] [address]  Acetaminophen level     Status: Abnormal   Collection Time: 02/01/20  6:33 PM  Result Value Ref Range   Acetaminophen (Tylenol), Serum <10 (L) 10 - 30 ug/mL    Comment: (NOTE) Therapeutic concentrations vary significantly. A range of 10-30 ug/mL  may be an effective concentration for many patients. However, some  are best treated at  concentrations outside of this range. Acetaminophen concentrations >150 ug/mL at 4 hours after ingestion  and >50 ug/mL at 12 hours after ingestion are often associated with  toxic reactions.  Performed at [facility], [address], [place], [place] [address]   cbc     Status: None   Collection Time: 02/01/20  6:33 PM  Result Value Ref Range   WBC 8.5 4.0 - 10.5 K/uL   RBC 4.54 4.22 - 5.81 MIL/uL   Hemoglobin 13.6 13.0 - 17.0 g/dL   HCT 29.5 39 - 52 %   MCV 88.5 80.0 - 100.0 fL   MCH 30.0 26.0 - 34.0 pg   MCHC 33.8 30.0 - 36.0 g/dL   RDW 62.1 30.8 - 65.7 %   Platelets 333 150 - 400 K/uL   nRBC 0.0 0.0 - 0.2 %    Comment: Performed at [facility], [address], [place], [place] [address]  Rapid urine drug screen (hospital performed)     Status: None   Collection Time: 02/01/20  9:23 PM  Result Value Ref Range   Opiates NONE DETECTED NONE DETECTED   Cocaine NONE DETECTED NONE DETECTED   Benzodiazepines NONE DETECTED NONE DETECTED   Amphetamines NONE DETECTED NONE DETECTED   Tetrahydrocannabinol NONE DETECTED NONE DETECTED   Barbiturates NONE DETECTED NONE DETECTED    Comment: (NOTE) DRUG SCREEN FOR MEDICAL PURPOSES ONLY.  IF CONFIRMATION IS NEEDED FOR ANY PURPOSE, NOTIFY LAB WITHIN 5 DAYS.  LOWEST DETECTABLE LIMITS FOR URINE DRUG SCREEN Drug Class                     Cutoff (ng/mL) Amphetamine and metabolites    1000 Barbiturate and metabolites    200 Benzodiazepine                 200 Tricyclics and metabolites     300 Opiates and metabolites        300 Cocaine and metabolites        300 THC  50 Performed at [facility], [address], [place], [place] [address]   Respiratory Panel by RT PCR (Flu A&B, Covid) - Nasopharyngeal Swab     Status: None   Collection Time: 02/02/20  4:52 AM   Specimen: Nasopharyngeal Swab  Result Value Ref Range   SARS Coronavirus 2 by RT PCR NEGATIVE NEGATIVE    Comment: (NOTE) SARS-CoV-2 target nucleic  acids are NOT DETECTED.  The SARS-CoV-2 RNA is generally detectable in upper respiratoy specimens during the acute phase of infection. The lowest concentration of SARS-CoV-2 viral copies this assay can detect is 131 copies/mL. A negative result does not preclude SARS-Cov-2 infection and should not be used as the sole basis for treatment or other patient management decisions. A negative result may occur with  improper specimen collection/handling, submission of specimen other than nasopharyngeal swab, presence of viral mutation(s) within the areas targeted by this assay, and inadequate number of viral copies (<131 copies/mL). A negative result must be combined with clinical observations, patient history, and epidemiological information. The expected result is Negative.  Fact Sheet for Patients:  https://www.moore.com/  Fact Sheet for Healthcare Providers:  https://www.young.biz/  This test is no t yet approved or cleared by the [place] FDA and  has been authorized for detection and/or diagnosis of SARS-CoV-2 by FDA under an Emergency Use Authorization (EUA). This EUA will remain  in effect (meaning this test can be used) for the duration of the COVID-19 declaration under Section 564(b)(1) of the Act, 21 U.S.C. section 360bbb-3(b)(1), unless the authorization is terminated or revoked sooner.     Influenza A by PCR NEGATIVE NEGATIVE   Influenza B by PCR NEGATIVE NEGATIVE    Comment: (NOTE) The Xpert Xpress SARS-CoV-2/FLU/RSV assay is intended as an aid in  the diagnosis of influenza from Nasopharyngeal swab specimens and  should not be used as a sole basis for treatment. Nasal washings and  aspirates are unacceptable for Xpert Xpress SARS-CoV-2/FLU/RSV  testing.  Fact Sheet for Patients: https://www.moore.com/  Fact Sheet for Healthcare Providers: https://www.young.biz/  This test is not yet  approved or cleared by the [place] FDA and  has been authorized for detection and/or diagnosis of SARS-CoV-2 by  FDA under an Emergency Use Authorization (EUA). This EUA will remain  in effect (meaning this test can be used) for the duration of the  Covid-19 declaration under Section 564(b)(1) of the Act, 21  U.S.C. section 360bbb-3(b)(1), unless the authorization is  terminated or revoked. Performed at [facility], [address], [place], [place] [address]     Blood Alcohol level:  Lab Results  Component Value Date   [facility] <10 02/01/2020   ETH <10 01/10/2020    Metabolic Disorder Labs:  Lab Results  Component Value Date   HGBA1C 5.2 06/01/2019   MPG 102.54 06/01/2019   Lab Results  Component Value Date   PROLACTIN 8.1 06/01/2019   Lab Results  Component Value Date   CHOL 216 (H) 06/01/2019   TRIG 217 (H) 06/01/2019   HDL 35 (L) 06/01/2019   CHOLHDL 6.2 06/01/2019   VLDL 43 (H) 06/01/2019   LDLCALC 138 (H) 06/01/2019    Current Medications: Current Facility-Administered Medications  Medication Dose Route Frequency Provider Last Rate Last Admin  . acetaminophen (TYLENOL) tablet 650 mg  650 mg Oral Q6H PRN Nwoko, Uchenna E, PA      . alum & mag hydroxide-simeth (MAALOX/MYLANTA) 200-200-20 MG/5ML suspension 30 mL  30 mL Oral Q4H PRN Nwoko, Uchenna E, PA      .  benztropine (COGENTIN) tablet 0.5 mg  0.5 mg Oral Q6H PRN Nwoko, Uchenna E, PA       Or  . benztropine mesylate (COGENTIN) injection 0.5 mg  0.5 mg Intramuscular Q6H PRN Nwoko, Uchenna E, PA      . fluPHENAZine (PROLIXIN) tablet 10 mg  10 mg Oral BID Antonieta Pert, MD      . haloperidol lactate (HALDOL) injection 10 mg  10 mg Intramuscular Q6H PRN Antonieta Pert, MD       Or  . haloperidol (HALDOL) tablet 10 mg  10 mg Oral Q6H PRN Antonieta Pert, MD      . hydrOXYzine (ATARAX/VISTARIL) tablet 25 mg  25 mg Oral TID PRN Meta Hatchet, PA      . LORazepam (ATIVAN) injection 1 mg  1 mg  Intramuscular Q6H PRN Nwoko, Uchenna E, PA       Or  . LORazepam (ATIVAN) tablet 1 mg  1 mg Oral Q6H PRN Nwoko, Uchenna E, PA      . magnesium hydroxide (MILK OF MAGNESIA) suspension 30 mL  30 mL Oral Daily PRN Nwoko, Uchenna E, PA      . traZODone (DESYREL) tablet 50 mg  50 mg Oral QHS PRN Nwoko, Uchenna E, PA       PTA Medications: Medications Prior to Admission  Medication Sig Dispense Refill Last Dose  . atorvastatin (LIPITOR) 20 MG tablet Take 20 mg by mouth daily.     . benztropine (COGENTIN) 1 MG tablet Take 1 tablet (1 mg total) by mouth 2 (two) times daily. 60 tablet 2   . buPROPion (WELLBUTRIN XL) 150 MG 24 hr tablet Take 150 mg by mouth daily.      . fluPHENAZine (PROLIXIN) 10 MG tablet Take 2 tablets (20 mg total) by mouth at bedtime. 60 tablet 2   . paliperidone (INVEGA SUSTENNA) 234 MG/1.5ML SUSY injection Inject 234 mg into the muscle once.     . traZODone (DESYREL) 50 MG tablet Take 1 tablet (50 mg total) by mouth at bedtime. 30 tablet 0   . ABILIFY MAINTENA 400 MG PRSY prefilled syringe Inject 400 mg into the muscle every 28 (twenty-eight) days. Last fill 11/02/2019 DS 28 (Patient not taking: Reported on 02/02/2020)   Not Taking at Unknown time  . diphenhydrAMINE (BENADRYL) 25 MG tablet Take 1 tablet (25 mg total) by mouth every 6 (six) hours as needed for itching. (Patient not taking: Reported on 02/02/2020) 20 tablet 0 Not Taking at Unknown time  . topiramate (TOPAMAX) 50 MG tablet Take 1 tablet (50 mg total) by mouth 2 (two) times daily. (Patient not taking: Reported on 02/02/2020) 60 tablet 2 Not Taking at Unknown time    Musculoskeletal: Strength & Muscle Tone: within normal limits Gait & Station: normal Patient leans: N/A  Psychiatric Specialty Exam: Physical Exam Vitals and nursing note reviewed.  HENT:     Head: Normocephalic and atraumatic.  Pulmonary:     Effort: Pulmonary effort is normal.  Neurological:     Mental Status: He is alert.     Review of  Systems  Blood pressure 130/76, pulse 62, temperature 98.5 F (36.9 C), temperature source Oral, resp. rate 18, height 6' (1.829 m), weight 107.5 kg, SpO2 100 %.Body mass index is 32.14 kg/m.  General Appearance: Fairly Groomed  Eye Contact:  Fair  Speech:  Normal Rate  Volume:  Normal  Mood:  Dysphoric  Affect:  Flat  Thought Process:  Goal Directed and Descriptions  of Associations: Circumstantial  Orientation:  Full (Time, Place, and Person)  Thought Content:  Delusions and Hallucinations: Auditory  Suicidal Thoughts:  No  Homicidal Thoughts:  No  Memory:  Immediate;   Fair Recent;   Fair Remote;   Fair  Judgement:  Intact  Insight:  Lacking  Psychomotor Activity:  Normal  Concentration:  Concentration: Fair  Recall:  [employer] of Knowledge:  Fair  Language:  Good  Akathisia:  Negative  Handed:  Right  AIMS (if indicated):     Assets:  Desire for Improvement Housing Resilience  ADL's:  Intact  Cognition:  WNL  Sleep:       Treatment Plan Summary: Daily contact with patient to assess and evaluate symptoms and progress in treatment, Medication management and Plan : Patient is seen and examined.  Patient is a 44 year old male with the above-stated past psychiatric history who was admitted secondary to worsening psychosis.  He will be admitted to the hospital.  He will be integrated in the milieu.  He will be encouraged to attend groups.  Until we can clarify some background information from the [facility] about his current medications we will place him back on Prolixin 10 mg p.o. twice daily.  He will also have available Cogentin as needed for side effects.  There were also be available Haldol as needed for agitation.  Also have available hydroxyzine for anxiety and lorazepam for significant agitation.  He will also have available trazodone 50 mg p.o. nightly as needed insomnia.  Review of his admission laboratories revealed essentially normal electrolytes  including creatinine and liver function enzymes.  His CBC was normal.  Acetaminophen was less than 10, salicylate less than 7.  His urinalysis was essentially normal.  Blood alcohol was less than 10.  Drug screen was negative.  We have ordered an EKG as well as a TSH on him today.  Currently his vital signs are stable, he is afebrile.  If necessary we will add back the amlodipine.  Observation Level/Precautions:  15 minute checks  Laboratory:  Chemistry Profile  Psychotherapy:    Medications:    Consultations:    Discharge Concerns:    Estimated LOS:  Other:     Physician Treatment Plan for Primary Diagnosis: <principal problem not specified> Long Term Goal(s): Improvement in symptoms so as ready for discharge  Short Term Goals: Ability to identify changes in lifestyle to reduce recurrence of condition will improve, Ability to verbalize feelings will improve, Ability to demonstrate self-control will improve, Ability to identify and develop effective coping behaviors will improve, Ability to maintain clinical measurements within normal limits will improve and Compliance with prescribed medications will improve  Physician Treatment Plan for Secondary Diagnosis: Active Problems:   Paranoid schizophrenia (HCC)  Long Term Goal(s): Improvement in symptoms so as ready for discharge  Short Term Goals: Ability to identify changes in lifestyle to reduce recurrence of condition will improve, Ability to verbalize feelings will improve, Ability to demonstrate self-control will improve, Ability to identify and develop effective coping behaviors will improve, Ability to maintain clinical measurements within normal limits will improve and Compliance with prescribed medications will improve  I certify that inpatient services furnished can reasonably be expected to improve the patient's condition.    Antonieta Pert, MD 10/6/20212:57 PM

## 2020-02-02 NOTE — Progress Notes (Signed)
   02/02/20 2035  Psych Admission Type (Psych Patients Only)  Admission Status Involuntary  Psychosocial Assessment  Patient Complaints None  Eye Contact Fair  Facial Expression Flat  Affect Appropriate to circumstance  Speech Logical/coherent  Interaction Other (Comment) (appropriate)  Motor Activity Other (Comment) (wnl)  Appearance/Hygiene Unremarkable  Behavior Characteristics Cooperative;Calm  Mood Sad  Thought Process  Coherency WDL  Content WDL  Delusions None reported or observed  Perception WDL  Hallucination None reported or observed  Judgment Poor  Confusion None  Danger to Self  Current suicidal ideation? Denies  Danger to Others  Danger to Others None reported or observed   Pt denies SI, HI, AVH and pain at this time. Pt is appropriate on the unit and takes meds as directed.

## 2020-02-02 NOTE — [facility] Group Notes (Signed)
[facility] LCSW Group Therapy  02/02/2020 3:02 PM  Type of Therapy:  Discharge Planning  Participation Level:  Minimal  Participation Quality:  Inattentive  Affect:  Ambivalent  Cognitive:  Lacking  Insight:  Lacking, Limited and Poor  Engagement in Therapy:  Distracting, Lacking and Limited  Modes of Intervention:  Activity, Discussion and Exploration  Summary of Progress/Problems: [name] attended today's group on the unit. He was minimally engaged and times laughing inappropriately. When prompted with questions to elaborate, Pt became guarded and paranoid. He accused this group co-leader of "trying to steal my future".   James Oms Santoria Chason, LCSW 02/02/2020, 3:02 PM

## 2020-02-02 NOTE — Progress Notes (Signed)
   02/02/20 0930  Vital Signs  Temp 98.5 F (36.9 C)  Temp Source Oral  Pulse Rate 62  Pulse Rate Source Dinamap  Resp 18  BP 130/76  BP Location Left Arm  BP Method Automatic  Patient Position (if appropriate) Sitting  Oxygen Therapy  SpO2 100 %  O2 Device Room Air  Pain Assessment  Pain Scale 0-10  Pain Score 0  Height and Weight  Height 6' (1.829 m)  Weight 107.5 kg  Type of Weight Stated  BSA (Calculated - sq m) 2.34 sq meters  BMI (Calculated) 32.14  Weight in (lb) to have BMI = 25 183.9    D: Patient is a 44 y.o. AA male brought to the ED for bizarre behavior. Pt. Was following customer in [employer]. Patient reported that he heard children and he wanted to protect them and he heard something from a "[employer]FO, but they were filming a movie." Pt. Complains of crying spells and suspiciousness. Pt. Denies SI/HI. Pt. Was pleasant and cooperative during interview A: Support and encouragement provided Routine safety checks conducted every 15 minutes. Patient  Informed to notify staff with any concerns.   R:  Safety maintained.

## 2020-02-03 DIAGNOSIS — F200 Paranoid schizophrenia: Secondary | ICD-10-CM | POA: Diagnosis not present

## 2020-02-03 LAB — LIPID PANEL
Cholesterol: 172 mg/dL (ref 0–200)
HDL: 35 mg/dL — ABNORMAL LOW (ref >40)
LDL Cholesterol: 97 mg/dL (ref 0–99)
Total CHOL/HDL Ratio: 4.9 RATIO
Triglycerides: 202 mg/dL — ABNORMAL HIGH (ref <150)
VLDL: 40 mg/dL (ref 0–40)

## 2020-02-03 LAB — HEMOGLOBIN A1C
Hgb A1c MFr Bld: 5.5 % (ref 4.8–5.6)
Mean Plasma Glucose: 111.15 mg/dL

## 2020-02-03 LAB — TSH: TSH: 1.137 u[IU]/mL (ref 0.350–4.500)

## 2020-02-03 MED ORDER — WHITE PETROLATUM EX OINT
TOPICAL_OINTMENT | CUTANEOUS | Status: AC
  Filled 2020-02-03: qty 5

## 2020-02-03 MED ORDER — FLUPHENAZINE HCL 5 MG PO TABS
15.0000 mg | ORAL_TABLET | Freq: Every day | ORAL | Status: DC
  Filled 2020-02-03: qty 3

## 2020-02-03 MED ORDER — TRAZODONE HCL 100 MG PO TABS
100.0000 mg | ORAL_TABLET | Freq: Every evening | ORAL | Status: DC | PRN
  Administered 2020-02-03 – 2020-02-07 (×4): 100 mg via ORAL
  Filled 2020-02-03 (×4): qty 1

## 2020-02-03 MED ORDER — FLUPHENAZINE HCL 5 MG PO TABS
20.0000 mg | ORAL_TABLET | Freq: Every day | ORAL | Status: DC
  Administered 2020-02-03 – 2020-02-04 (×2): 20 mg via ORAL
  Filled 2020-02-03 (×3): qty 4

## 2020-02-03 MED ORDER — BENZTROPINE MESYLATE 1 MG PO TABS
1.0000 mg | ORAL_TABLET | Freq: Two times a day (BID) | ORAL | Status: DC
  Administered 2020-02-03 – 2020-02-08 (×10): 1 mg via ORAL
  Filled 2020-02-03 (×13): qty 1

## 2020-02-03 MED ORDER — FLUPHENAZINE HCL 5 MG PO TABS
10.0000 mg | ORAL_TABLET | Freq: Every day | ORAL | Status: DC
  Administered 2020-02-03 – 2020-02-04 (×2): 10 mg via ORAL
  Filled 2020-02-03: qty 2
  Filled 2020-02-03 (×2): qty 1
  Filled 2020-02-03: qty 2

## 2020-02-03 NOTE — Progress Notes (Signed)
   02/03/20 1935  Psych Admission Type (Psych Patients Only)  Admission Status Involuntary  Psychosocial Assessment  Patient Complaints None  Eye Contact Fair  Facial Expression Flat  Affect Appropriate to circumstance  Speech Logical/coherent  Interaction Assertive  Motor Activity Other (Comment) (wnl)  Appearance/Hygiene Unremarkable;In scrubs  Behavior Characteristics Cooperative  Mood Depressed  Thought Process  Coherency WDL  Content WDL  Delusions None reported or observed  Perception WDL  Hallucination None reported or observed  Judgment Poor  Confusion None  Danger to Self  Current suicidal ideation? Denies  Danger to Others  Danger to Others None reported or observed   Pt pleasant, pacing the halls. Pt denies SI, HI, AVH and pain at this time. Takes meds as directed.

## 2020-02-03 NOTE — Progress Notes (Signed)
[facility]  02/03/2020 11:30 AM Nichola SizerBrian T Digioia  MRN:  [id number] Subjective: Patient is a 44 year old male with a past psychiatric history significant for schizophrenia who was placed under involuntary commitment by his mother and [facility] by police [facility] department on 02/01/2020.  His mother stated the patient had been noncompliant with medications and was having auditory hallucinations as well as paranoia.  Objective: Patient is seen and examined.  Patient is a 44 year old male with the above-stated past psychiatric history who is seen in follow-up.  He is doing better.  He denied any auditory or visual hallucinations.  He denied any suicidal or homicidal ideation.  He has been compliant with medications.  We are still awaiting information with regard to when he received his long-acting injectable medication last in which medication it was.  His vital signs are stable, he is afebrile.  He only slept 4 hours last night.  Principal Problem: <principal problem not specified> Diagnosis: Active Problems:   Paranoid schizophrenia (HCC)  Total Time spent with patient: 20 minutes  Past Psychiatric History: See admission H&P  Past Medical History:  Past Medical History:  Diagnosis Date  . Bipolar disorder (HCC)   . Bronchitis   . Hyperlipidemia   . Paranoid schizophrenia (HCC)   . Schizophrenia [facility](HCC)     Past Surgical History:  Procedure Laterality Date  . TESTICLE IMPLANTATION TO THIGH    . TESTICLE SURGERY     Family History:  Family History  Problem Relation Age of Onset  . Schizophrenia Neg Hx    Family Psychiatric  History: See admission H&P Social History:  Social History   Substance and Sexual Activity  Alcohol Use Not Currently   Comment: weekly     Social History   Substance and Sexual Activity  Drug Use Not Currently  . Types: Marijuana    Social History   Socioeconomic History  . Marital status: Single    Spouse name: Not on  file  . Number of children: Not on file  . Years of education: 12 years  . Highest education level: 12th grade  Occupational History  . Occupation: On disability  Tobacco Use  . Smoking status: Current Every Day Smoker    Packs/day: 1.00    Types: Cigarettes  . Smokeless tobacco: Never Used  Vaping Use  . Vaping Use: Never used  Substance and Sexual Activity  . Alcohol use: Not Currently    Comment: weekly  . Drug use: Not Currently    Types: Marijuana  . Sexual activity: Yes    Birth control/protection: Condom  Other Topics Concern  . Not on file  Social History Narrative   Pt lives alone in apartment complex for mentally ill   Social Determinants of Health   Financial Resource Strain:   . Difficulty of Paying Living Expenses: Not on file  Food Insecurity:   . Worried About Programme researcher, broadcasting/film/videounning Out of Food in the Last Year: Not on file  . Ran Out of Food in the Last Year: Not on file  Transportation Needs:   . Lack of Transportation (Medical): Not on file  . Lack of Transportation (Non-Medical): Not on file  Physical Activity:   . Days of Exercise per Week: Not on file  . Minutes of Exercise per Session: Not on file  Stress:   . Feeling of Stress : Not on file  Social Connections:   . Frequency of Communication with Friends and Family: Not on file  .  Frequency of Social Gatherings with Friends and Family: Not on file  . Attends Religious Services: Not on file  . Active Member of Clubs or Organizations: Not on file  . Attends Banker Meetings: Not on file  . Marital Status: Not on file   Additional Social History:                         Sleep: Poor  Appetite:  Fair  Current Medications: Current Facility-Administered Medications  Medication Dose Route Frequency Provider Last Rate Last Admin  . acetaminophen (TYLENOL) tablet 650 mg  650 mg Oral Q6H PRN Nwoko, Uchenna E, PA      . alum & mag hydroxide-simeth (MAALOX/MYLANTA) 200-200-20 MG/5ML  suspension 30 mL  30 mL Oral Q4H PRN Nwoko, Uchenna E, PA   30 mL at 02/02/20 2101  . benztropine (COGENTIN) tablet 0.5 mg  0.5 mg Oral Q6H PRN Nwoko, Uchenna E, PA       Or  . benztropine mesylate (COGENTIN) injection 0.5 mg  0.5 mg Intramuscular Q6H PRN Nwoko, Uchenna E, PA      . fluPHENAZine (PROLIXIN) tablet 10 mg  10 mg Oral Daily Antonieta Pert, MD      . fluPHENAZine (PROLIXIN) tablet 15 mg  15 mg Oral QHS Antonieta Pert, MD      . haloperidol lactate (HALDOL) injection 10 mg  10 mg Intramuscular Q6H PRN Antonieta Pert, MD       Or  . haloperidol (HALDOL) tablet 10 mg  10 mg Oral Q6H PRN Antonieta Pert, MD      . hydrOXYzine (ATARAX/VISTARIL) tablet 25 mg  25 mg Oral TID PRN Karel Jarvis E, PA   25 mg at 02/02/20 2034  . LORazepam (ATIVAN) injection 1 mg  1 mg Intramuscular Q6H PRN Nwoko, Uchenna E, PA       Or  . LORazepam (ATIVAN) tablet 1 mg  1 mg Oral Q6H PRN Nwoko, Uchenna E, PA      . magnesium hydroxide (MILK OF MAGNESIA) suspension 30 mL  30 mL Oral Daily PRN Nwoko, Uchenna E, PA      . traZODone (DESYREL) tablet 50 mg  50 mg Oral QHS PRN Nwoko, Uchenna E, PA      . white petrolatum (VASELINE) gel           . white petrolatum (VASELINE) gel             Lab Results:  Results for orders placed or performed during the hospital encounter of 02/01/20 (from the past 48 hour(s))  Comprehensive metabolic panel     Status: None   Collection Time: 02/01/20  6:33 PM  Result Value Ref Range   Sodium 137 135 - 145 mmol/L   Potassium 3.6 3.5 - 5.1 mmol/L   Chloride 104 98 - 111 mmol/L   CO2 23 22 - 32 mmol/L   Glucose, Bld 99 70 - 99 mg/dL    Comment: Glucose reference range applies only to samples taken after fasting for at least 8 hours.   BUN 15 6 - 20 mg/dL   Creatinine, Ser 6.81 0.61 - 1.24 mg/dL   Calcium 9.1 8.9 - 27.5 mg/dL   Total Protein 7.0 6.5 - 8.1 g/dL   Albumin 4.3 3.5 - 5.0 g/dL   AST 40 15 - 41 U/L   ALT 34 0 - 44 U/L   Alkaline Phosphatase 65  38 - 126 U/L  Total Bilirubin 0.6 0.3 - 1.2 mg/dL   GFR calc non Af Amer >60 >60 mL/min   Anion gap 10 5 - 15    Comment: Performed at [facility], [address], [place], [place] [address]  Ethanol     Status: None   Collection Time: 02/01/20  6:33 PM  Result Value Ref Range   Alcohol, Ethyl (B) <10 <10 mg/dL    Comment: (NOTE) Lowest detectable limit for serum alcohol is 10 mg/dL.  For medical purposes only. Performed at [facility], [address], [place], [place] [address]   Salicylate level     Status: Abnormal   Collection Time: 02/01/20  6:33 PM  Result Value Ref Range   Salicylate Lvl <7.0 (L) 7.0 - 30.0 mg/dL    Comment: Performed at [facility], [address] [place], [place] [address]  Acetaminophen level     Status: Abnormal   Collection Time: 02/01/20  6:33 PM  Result Value Ref Range   Acetaminophen (Tylenol), Serum <10 (L) 10 - 30 ug/mL    Comment: (NOTE) Therapeutic concentrations vary significantly. A range of 10-30 ug/mL  may be an effective concentration for many patients. However, some  are best treated at concentrations outside of this range. Acetaminophen concentrations >150 ug/mL at 4 hours after ingestion  and >50 ug/mL at 12 hours after ingestion are often associated with  toxic reactions.  Performed at [facility], [address], [place], [place] [address]   cbc     Status: None   Collection Time: 02/01/20  6:33 PM  Result Value Ref Range   WBC 8.5 4.0 - 10.5 K/uL   RBC 4.54 4.22 - 5.81 MIL/uL   Hemoglobin 13.6 13.0 - 17.0 g/dL   HCT 81.8 39 - 52 %   MCV 88.5 80.0 - 100.0 fL   MCH 30.0 26.0 - 34.0 pg   MCHC 33.8 30.0 - 36.0 g/dL   RDW 29.9 37.1 - 69.6 %   Platelets 333 150 - 400 K/uL   nRBC 0.0 0.0 - 0.2 %    Comment: Performed at [facility], [address], [place], [place] [address]  Rapid urine drug screen (hospital performed)     Status: None   Collection Time: 02/01/20  9:23 PM  Result Value Ref Range   Opiates NONE DETECTED NONE  DETECTED   Cocaine NONE DETECTED NONE DETECTED   Benzodiazepines NONE DETECTED NONE DETECTED   Amphetamines NONE DETECTED NONE DETECTED   Tetrahydrocannabinol NONE DETECTED NONE DETECTED   Barbiturates NONE DETECTED NONE DETECTED    Comment: (NOTE) DRUG SCREEN FOR MEDICAL PURPOSES ONLY.  IF CONFIRMATION IS NEEDED FOR ANY PURPOSE, NOTIFY LAB WITHIN 5 DAYS.  LOWEST DETECTABLE LIMITS FOR URINE DRUG SCREEN Drug Class                     Cutoff (ng/mL) Amphetamine and metabolites    1000 Barbiturate and metabolites    200 Benzodiazepine                 200 Tricyclics and metabolites     300 Opiates and metabolites        300 Cocaine and metabolites        300 THC                            50 Performed at [facility], [address], [place], [place] [address]   Respiratory Panel  by RT PCR (Flu A&B, Covid) - Nasopharyngeal Swab     Status: None   Collection Time: 02/02/20  4:52 AM   Specimen: Nasopharyngeal Swab  Result Value Ref Range   SARS Coronavirus 2 by RT PCR NEGATIVE NEGATIVE    Comment: (NOTE) SARS-CoV-2 target nucleic acids are NOT DETECTED.  The SARS-CoV-2 RNA is generally detectable in upper respiratoy specimens during the acute phase of infection. The lowest concentration of SARS-CoV-2 viral copies this assay can detect is 131 copies/mL. A negative result does not preclude SARS-Cov-2 infection and should not be used as the sole basis for treatment or other patient management decisions. A negative result may occur with  improper specimen collection/handling, submission of specimen other than nasopharyngeal swab, presence of viral mutation(s) within the areas targeted by this assay, and inadequate number of viral copies (<131 copies/mL). A negative result must be combined with clinical observations, patient history, and epidemiological information. The expected result is Negative.  Fact Sheet for Patients:  https://www.moore.com/  Fact Sheet  for Healthcare Providers:  https://www.young.biz/  This test is no t yet approved or cleared by the [place] FDA and  has been authorized for detection and/or diagnosis of SARS-CoV-2 by FDA under an Emergency Use Authorization (EUA). This EUA will remain  in effect (meaning this test can be used) for the duration of the COVID-19 declaration under Section 564(b)(1) of the Act, 21 U.S.C. section 360bbb-3(b)(1), unless the authorization is terminated or revoked sooner.     Influenza A by PCR NEGATIVE NEGATIVE   Influenza B by PCR NEGATIVE NEGATIVE    Comment: (NOTE) The Xpert Xpress SARS-CoV-2/FLU/RSV assay is intended as an aid in  the diagnosis of influenza from Nasopharyngeal swab specimens and  should not be used as a sole basis for treatment. Nasal washings and  aspirates are unacceptable for Xpert Xpress SARS-CoV-2/FLU/RSV  testing.  Fact Sheet for Patients: https://www.moore.com/  Fact Sheet for Healthcare Providers: https://www.young.biz/  This test is not yet approved or cleared by the [place] FDA and  has been authorized for detection and/or diagnosis of SARS-CoV-2 by  FDA under an Emergency Use Authorization (EUA). This EUA will remain  in effect (meaning this test can be used) for the duration of the  Covid-19 declaration under Section 564(b)(1) of the Act, 21  U.S.C. section 360bbb-3(b)(1), unless the authorization is  terminated or revoked. Performed at [facility], [address], [place], [place] [address]     Blood Alcohol level:  Lab Results  Component Value Date   [facility] <10 02/01/2020   ETH <10 01/10/2020    Metabolic Disorder Labs: Lab Results  Component Value Date   HGBA1C 5.2 06/01/2019   MPG 102.54 06/01/2019   Lab Results  Component Value Date   PROLACTIN 8.1 06/01/2019   Lab Results  Component Value Date   CHOL 216 (H) 06/01/2019   TRIG 217 (H) 06/01/2019   HDL 35 (L)  06/01/2019   CHOLHDL 6.2 06/01/2019   VLDL 43 (H) 06/01/2019   LDLCALC 138 (H) 06/01/2019    Physical Findings: AIMS:  , ,  ,  ,    CIWA:    COWS:     Musculoskeletal: Strength & Muscle Tone: within normal limits Gait & Station: normal Patient leans: N/A  Psychiatric Specialty Exam: Physical Exam Vitals and nursing note reviewed.  Constitutional:      Appearance: Normal appearance.  HENT:     Head: Normocephalic and atraumatic.  Pulmonary:     Effort:  Pulmonary effort is normal.  Neurological:     General: No focal deficit present.     Mental Status: He is alert and oriented to person, place, and time.     Review of Systems  Blood pressure 130/76, pulse 62, temperature 98.5 F (36.9 C), temperature source Oral, resp. rate 18, height 6' (1.829 m), weight 107.5 kg, SpO2 100 %.Body mass index is 32.14 kg/m.  General Appearance: Casual  Eye Contact:  Fair  Speech:  Normal Rate  Volume:  Normal  Mood:  Anxious  Affect:  Congruent  Thought Process:  Coherent and Descriptions of Associations: Circumstantial  Orientation:  Full (Time, Place, and Person)  Thought Content:  Delusions  Suicidal Thoughts:  No  Homicidal Thoughts:  No  Memory:  Immediate;   Fair Recent;   Fair Remote;   Fair  Judgement:  Intact  Insight:  Lacking  Psychomotor Activity:  Increased  Concentration:  Concentration: Fair and Attention Span: Fair  Recall:  [employer] of Knowledge:  Fair  Language:  Fair  Akathisia:  Negative  Handed:  Right  AIMS (if indicated):     Assets:  Desire for Improvement Resilience  ADL's:  Intact  Cognition:  WNL  Sleep:  Number of Hours: 4     Treatment Plan Summary: Daily contact with patient to assess and evaluate symptoms and progress in treatment, Medication management and Plan : Patient is seen and examined.  Patient is a 44 year old male with the above-stated past psychiatric history who is seen in follow-up.   Diagnosis: 1.   Schizophrenia  Pertinent findings on examination today: 1.  Denies suicidal or homicidal ideation. 2.  Denies auditory or visual hallucinations. 3.  Delusional thinking appears to have decreased. 4.  Only slept 4 hours last night.  Plan: 1.  Increase Prolixin to 10 mg p.o. daily and 20 mg p.o. nightly for psychosis and mood stability. 2.  Will have pharmacy double check with patient's act team or pharmacy to find out what long-acting injectable the patient has had most recently. 3.  Increase trazodone to 100 mg p.o. nightly as needed insomnia. 4.  Continue atorvastatin 20 mg p.o. daily for hyperlipidemia. 5.  Add Cogentin 1 mg p.o. twice daily for side effects of Prolixin. 6.  We need to get the EKG done today. 7.  Laboratories again were not drawn in the a.m., and I will write for them this p.m. 8.  We will continue to hold amlodipine given normal pressure while in our facility. 9.  Disposition planning-in progress.  Antonieta Pert, MD 02/03/2020, 11:30 AM

## 2020-02-03 NOTE — Progress Notes (Signed)
DAR NOTE: Patient presents with anxious affect and depressed mood.  Denies suicidal thoughts, pain, auditory and visual hallucinations.  Described energy level as normal and concentration as good.  Rates depression at 0, hopelessness at 0, and anxiety at 0.  Maintained on routine safety checks.  Medications given as prescribed.  Support and encouragement offered as needed.    States goal for today is "going home to my apartment."  Patient visible in milieu briefly.  Patient is safe on the unit.

## 2020-02-03 NOTE — BHH Group Notes (Signed)
Adult Psychoeducational Group Note  Date:  02/03/2020 Time:  9:27 PM  Group Topic/Focus:  Wrap-Up Group:   The focus of this group is to help patients review their daily goal of treatment and discuss progress on daily workbooks.  Participation Level:  Active  Participation Quality:  Appropriate and Attentive  Affect:  Appropriate  Cognitive:  Alert and Appropriate  Insight: Appropriate and Good  Engagement in Group:  Engaged  Modes of Intervention:  Discussion and Education  Additional Comments:  Pt attended and participated in wrap up group this evening and rated their day a 10/10, due to them being D/C tomorrow. Pt completed their goal, which was to receive their D/C date. Some helpful coping skills the pt has learned has being coping skills for dealing with depression (crying spells).  Chrisandra Netters 02/03/2020, 9:27 PM

## 2020-02-04 DIAGNOSIS — F200 Paranoid schizophrenia: Secondary | ICD-10-CM | POA: Diagnosis not present

## 2020-02-04 MED ORDER — PALIPERIDONE ER 6 MG PO TB24
6.0000 mg | ORAL_TABLET | Freq: Every day | ORAL | Status: DC
  Administered 2020-02-04: 6 mg via ORAL
  Filled 2020-02-04 (×2): qty 1

## 2020-02-04 MED ORDER — WHITE PETROLATUM EX OINT
TOPICAL_OINTMENT | CUTANEOUS | Status: AC
  Filled 2020-02-04: qty 5

## 2020-02-04 MED ORDER — ARIPIPRAZOLE 5 MG PO TABS
5.0000 mg | ORAL_TABLET | Freq: Every day | ORAL | Status: DC
  Filled 2020-02-04: qty 1

## 2020-02-04 NOTE — BHH Counselor (Signed)
CSW called and left voice message with [facility] in [place] to inform that pt is discharging over weekend.

## 2020-02-04 NOTE — Progress Notes (Signed)
   02/04/20 1300  Psych Admission Type (Psych Patients Only)  Admission Status Involuntary  Psychosocial Assessment  Patient Complaints None  Eye Contact Fair  Facial Expression Flat  Affect Appropriate to circumstance  Speech Logical/coherent  Interaction Assertive  Motor Activity Other (Comment) (wnl)  Appearance/Hygiene Unremarkable;In scrubs  Behavior Characteristics Cooperative  Mood Depressed  Thought Process  Coherency WDL  Content WDL  Delusions None reported or observed  Perception WDL  Hallucination None reported or observed  Judgment Poor  Confusion None  Danger to Self  Current suicidal ideation? Denies  Danger to Others  Danger to Others None reported or observed

## 2020-02-04 NOTE — Tx Team (Signed)
Interdisciplinary Treatment and Diagnostic Plan Update  02/04/2020 Time of Session: 9:55am [name] MRN: [id number]  Principal Diagnosis: <principal problem not specified>  Secondary Diagnoses: Active Problems:   Paranoid schizophrenia (HCC)   Current Medications:  Current Facility-Administered Medications  Medication Dose Route Frequency Provider Last Rate Last Admin  . white petrolatum (VASELINE) gel           . acetaminophen (TYLENOL) tablet 650 mg  650 mg Oral Q6H PRN [name], James E, PA      . alum & mag hydroxide-simeth (MAALOX/MYLANTA) 200-200-20 MG/5ML suspension 30 mL  30 mL Oral Q4H PRN [name], James E, PA   30 mL at 02/02/20 2101  . benztropine (COGENTIN) tablet 1 mg  1 mg Oral BID Antonieta Pert, MD   1 mg at 02/04/20 3664  . fluPHENAZine (PROLIXIN) tablet 10 mg  10 mg Oral Daily Antonieta Pert, MD   10 mg at 02/04/20 0936  . fluPHENAZine (PROLIXIN) tablet 20 mg  20 mg Oral QHS Antonieta Pert, MD   20 mg at 02/03/20 2130  . haloperidol lactate (HALDOL) injection 10 mg  10 mg Intramuscular Q6H PRN Antonieta Pert, MD       Or  . haloperidol (HALDOL) tablet 10 mg  10 mg Oral Q6H PRN Antonieta Pert, MD   10 mg at 02/04/20 0051  . hydrOXYzine (ATARAX/VISTARIL) tablet 25 mg  25 mg Oral TID PRN [name], James E, PA   25 mg at 02/03/20 2132  . LORazepam (ATIVAN) injection 1 mg  1 mg Intramuscular Q6H PRN [name], James E, PA       Or  . LORazepam (ATIVAN) tablet 1 mg  1 mg Oral Q6H PRN [name], James E, PA      . magnesium hydroxide (MILK OF MAGNESIA) suspension 30 mL  30 mL Oral Daily PRN [name], James E, PA      . paliperidone (INVEGA) 24 hr tablet 6 mg  6 mg Oral QHS Antonieta Pert, MD      . traZODone (DESYREL) tablet 100 mg  100 mg Oral QHS PRN Antonieta Pert, MD   100 mg at 02/03/20 2132   PTA Medications: Medications Prior to Admission  Medication Sig Dispense Refill Last Dose  . atorvastatin (LIPITOR) 20 MG tablet Take 20 mg by mouth  daily.     . benztropine (COGENTIN) 1 MG tablet Take 1 tablet (1 mg total) by mouth 2 (two) times daily. 60 tablet 2   . buPROPion (WELLBUTRIN XL) 150 MG 24 hr tablet Take 150 mg by mouth daily.      . fluPHENAZine (PROLIXIN) 10 MG tablet Take 2 tablets (20 mg total) by mouth at bedtime. 60 tablet 2   . paliperidone (INVEGA SUSTENNA) 234 MG/1.5ML SUSY injection Inject 234 mg into the muscle once.     . traZODone (DESYREL) 50 MG tablet Take 1 tablet (50 mg total) by mouth at bedtime. 30 tablet 0   . ABILIFY MAINTENA 400 MG PRSY prefilled syringe Inject 400 mg into the muscle every 28 (twenty-eight) days. Last fill 11/02/2019 DS 28 (Patient not taking: Reported on 02/02/2020)   Not Taking at Unknown time  . diphenhydrAMINE (BENADRYL) 25 MG tablet Take 1 tablet (25 mg total) by mouth every 6 (six) hours as needed for itching. (Patient not taking: Reported on 02/02/2020) 20 tablet 0 Not Taking at Unknown time  . topiramate (TOPAMAX) 50 MG tablet Take 1 tablet (50 mg total) by mouth 2 (  two) times daily. (Patient not taking: Reported on 02/02/2020) 60 tablet 2 Not Taking at Unknown time    Patient Stressors: Financial difficulties  Patient Strengths: Average or above average intelligence Communication skills  Treatment Modalities: Medication Management, Group therapy, Case management,  1 to 1 session with clinician, Psychoeducation, Recreational therapy.   Physician Treatment Plan for Primary Diagnosis: <principal problem not specified> Long Term Goal(s): Improvement in symptoms so as ready for discharge Improvement in symptoms so as ready for discharge   Short Term Goals: Ability to identify changes in lifestyle to reduce recurrence of condition will improve Ability to verbalize feelings will improve Ability to demonstrate self-control will improve Ability to identify and develop effective coping behaviors will improve Ability to maintain clinical measurements within normal limits will  improve Compliance with prescribed medications will improve Ability to identify changes in lifestyle to reduce recurrence of condition will improve Ability to verbalize feelings will improve Ability to demonstrate self-control will improve Ability to identify and develop effective coping behaviors will improve Ability to maintain clinical measurements within normal limits will improve Compliance with prescribed medications will improve  Medication Management: Evaluate patient's response, side effects, and tolerance of medication regimen.  Therapeutic Interventions: 1 to 1 sessions, Unit Group sessions and Medication administration.  Evaluation of Outcomes: Progressing  Physician Treatment Plan for Secondary Diagnosis: Active Problems:   Paranoid schizophrenia (HCC)  Long Term Goal(s): Improvement in symptoms so as ready for discharge Improvement in symptoms so as ready for discharge   Short Term Goals: Ability to identify changes in lifestyle to reduce recurrence of condition will improve Ability to verbalize feelings will improve Ability to demonstrate self-control will improve Ability to identify and develop effective coping behaviors will improve Ability to maintain clinical measurements within normal limits will improve Compliance with prescribed medications will improve Ability to identify changes in lifestyle to reduce recurrence of condition will improve Ability to verbalize feelings will improve Ability to demonstrate self-control will improve Ability to identify and develop effective coping behaviors will improve Ability to maintain clinical measurements within normal limits will improve Compliance with prescribed medications will improve     Medication Management: Evaluate patient's response, side effects, and tolerance of medication regimen.  Therapeutic Interventions: 1 to 1 sessions, Unit Group sessions and Medication administration.  Evaluation of Outcomes:  Progressing   RN Treatment Plan for Primary Diagnosis: <principal problem not specified> Long Term Goal(s): Knowledge of disease and therapeutic regimen to maintain health will improve  Short Term Goals: Ability to remain free from injury will improve, Ability to demonstrate self-control, Ability to participate in decision making will improve, Ability to identify and develop effective coping behaviors will improve and Compliance with prescribed medications will improve  Medication Management: RN will administer medications as ordered by provider, will assess and evaluate patient's response and provide education to patient for prescribed medication. RN will report any adverse and/or side effects to prescribing provider.  Therapeutic Interventions: 1 on 1 counseling sessions, Psychoeducation, Medication administration, Evaluate responses to treatment, Monitor vital signs and CBGs as ordered, Perform/monitor CIWA, COWS, AIMS and Fall Risk screenings as ordered, Perform wound care treatments as ordered.  Evaluation of Outcomes: Progressing   LCSW Treatment Plan for Primary Diagnosis: <principal problem not specified> Long Term Goal(s): Safe transition to appropriate next level of care at discharge, Engage patient in therapeutic group addressing interpersonal concerns.  Short Term Goals: Engage patient in aftercare planning with referrals and resources, Increase social support, Increase emotional regulation, Facilitate acceptance of  mental health diagnosis and concerns, Identify triggers associated with mental health/substance abuse issues and Increase skills for wellness and recovery  Therapeutic Interventions: Assess for all discharge needs, 1 to 1 time with Social worker, Explore available resources and support systems, Assess for adequacy in community support network, Educate family and significant other(s) on suicide prevention, Complete Psychosocial Assessment, Interpersonal group  therapy.  Evaluation of Outcomes: Progressing   Progress in Treatment: Attending groups: Yes. Participating in groups: Yes. Taking medication as prescribed: Yes. Toleration medication: Yes. Family/Significant other contact made: Yes, individual(s) contacted:  Mother Patient understands diagnosis: No. Discussing patient identified problems/goals with staff: Yes. Medical problems stabilized or resolved: Yes. Denies suicidal/homicidal ideation: Yes. Issues/concerns per patient self-inventory: No.   New problem(s) identified: No, Describe:  None  New Short Term/Long Term Goal(s): medication stabilization, elimination of SI thoughts, development of comprehensive mental wellness plan.   Patient Goals:  "To fix my face and my apartment"   Discharge Plan or Barriers: Patient recently admitted. CSW will continue to follow and assess for appropriate referrals and possible discharge planning.   Reason for Continuation of Hospitalization: Hallucinations Medication stabilization  Estimated Length of Stay: 3 to 5 days   Attendees: Patient: [name] 02/04/2020   Physician: Landry Mellow, MD 02/04/2020   Nursing:  02/04/2020   RN Care Manager: 02/04/2020   Social Worker: Melba Coon, LCSW 02/04/2020   Recreational Therapist:  02/04/2020   Other:  02/04/2020   Other:  02/04/2020   Other: 02/04/2020      Scribe for Treatment Team: [name], LCSWA 02/04/2020 11:09 AM

## 2020-02-04 NOTE — Progress Notes (Signed)
[facility] MD Progress Note  02/04/2020 10:34 AM VIVIANO BIR  MRN:  [id number] Subjective:  Patient is a 44 year old male with a past psychiatric history significant for schizophrenia who was placed under involuntary commitment by his mother and transported by police to the [facility] emergency department on 02/01/2020.  His mother stated the patient had been noncompliant with medications and was having auditory hallucinations as well as paranoia.  Patient is seen and examined.  Patient is a 44 year old male with the above-stated past psychiatric history who is seen in follow-up.  Earlier today he spoke with nursing, and stated that he was communicating with "[employer]".  He is seen also in treatment team today, and the patient stated that he needed someone to help him fix his toilet in his apartment, and to fix his bed since it was not stable.  We discussed whether or not he had talked to his ACTT service about that, and he stated that he had fired them.  Pharmacy was able to find out that he received the long-acting paliperidone injection on 9/30.  He is pleasant, but is still psychotic.  The difficult thing is it is hard to say what his baseline function is.  He has not been sleeping at night, but nursing reiterated that he is sleeping a great deal during the day.  We discussed the fact that he needed to get out of bed and stay active during the day so he slept better at night.  We also discussed the fact that I was going to start oral paliperidone at bedtime to see if that would help him rest better as well as control some of his psychotic and delusional thinking.  He did deny suicidal or homicidal ideation.  His vital signs are stable, he is afebrile.  As stated above he slept 3 hours last night.  His hemoglobin A1c from 10/7 was 5.5.  TSH was 1.137.  His EKG showed a normal sinus rhythm with a QTc interval of 398.  Principal Problem: <principal problem not specified> Diagnosis: Active Problems:   Paranoid  schizophrenia (HCC)  Total Time spent with patient: 20 minutes  Past Psychiatric History: See admission H&P  Past Medical History:  Past Medical History:  Diagnosis Date  . Bipolar disorder (HCC)   . Bronchitis   . Hyperlipidemia   . Paranoid schizophrenia (HCC)   . Schizophrenia [facility])     Past Surgical History:  Procedure Laterality Date  . TESTICLE IMPLANTATION TO THIGH    . TESTICLE SURGERY     Family History:  Family History  Problem Relation Age of Onset  . Schizophrenia Neg Hx    Family Psychiatric  History: See admission H&P Social History:  Social History   Substance and Sexual Activity  Alcohol Use Not Currently   Comment: weekly     Social History   Substance and Sexual Activity  Drug Use Not Currently  . Types: Marijuana    Social History   Socioeconomic History  . Marital status: Single    Spouse name: Not on file  . Number of children: Not on file  . Years of education: 12 years  . Highest education level: 12th grade  Occupational History  . Occupation: On disability  Tobacco Use  . Smoking status: Current Every Day Smoker    Packs/day: 1.00    Types: Cigarettes  . Smokeless tobacco: Never Used  Vaping Use  . Vaping Use: Never used  Substance and Sexual Activity  . Alcohol use: Not  Currently    Comment: weekly  . Drug use: Not Currently    Types: Marijuana  . Sexual activity: Yes    Birth control/protection: Condom  Other Topics Concern  . Not on file  Social History Narrative   Pt lives alone in apartment complex for mentally ill   Social Determinants of Health   Financial Resource Strain:   . Difficulty of Paying Living Expenses: Not on file  Food Insecurity:   . Worried About Programme researcher, broadcasting/film/video in the Last Year: Not on file  . Ran Out of Food in the Last Year: Not on file  Transportation Needs:   . Lack of Transportation (Medical): Not on file  . Lack of Transportation (Non-Medical): Not on file  Physical Activity:   .  Days of Exercise per Week: Not on file  . Minutes of Exercise per Session: Not on file  Stress:   . Feeling of Stress : Not on file  Social Connections:   . Frequency of Communication with Friends and Family: Not on file  . Frequency of Social Gatherings with Friends and Family: Not on file  . Attends Religious Services: Not on file  . Active Member of Clubs or Organizations: Not on file  . Attends Banker Meetings: Not on file  . Marital Status: Not on file   Additional Social History:                         Sleep: Poor  Appetite:  Good  Current Medications: Current Facility-Administered Medications  Medication Dose Route Frequency Provider Last Rate Last Admin  . acetaminophen (TYLENOL) tablet 650 mg  650 mg Oral Q6H PRN Nwoko, Uchenna E, PA      . alum & mag hydroxide-simeth (MAALOX/MYLANTA) 200-200-20 MG/5ML suspension 30 mL  30 mL Oral Q4H PRN Nwoko, Uchenna E, PA   30 mL at 02/02/20 2101  . benztropine (COGENTIN) tablet 1 mg  1 mg Oral BID Antonieta Pert, MD   1 mg at 02/04/20 1324  . fluPHENAZine (PROLIXIN) tablet 10 mg  10 mg Oral Daily Antonieta Pert, MD   10 mg at 02/04/20 0936  . fluPHENAZine (PROLIXIN) tablet 20 mg  20 mg Oral QHS Antonieta Pert, MD   20 mg at 02/03/20 2130  . haloperidol lactate (HALDOL) injection 10 mg  10 mg Intramuscular Q6H PRN Antonieta Pert, MD       Or  . haloperidol (HALDOL) tablet 10 mg  10 mg Oral Q6H PRN Antonieta Pert, MD   10 mg at 02/04/20 0051  . hydrOXYzine (ATARAX/VISTARIL) tablet 25 mg  25 mg Oral TID PRN Nwoko, Uchenna E, PA   25 mg at 02/03/20 2132  . LORazepam (ATIVAN) injection 1 mg  1 mg Intramuscular Q6H PRN Nwoko, Uchenna E, PA       Or  . LORazepam (ATIVAN) tablet 1 mg  1 mg Oral Q6H PRN Nwoko, Uchenna E, PA      . magnesium hydroxide (MILK OF MAGNESIA) suspension 30 mL  30 mL Oral Daily PRN Nwoko, Uchenna E, PA      . paliperidone (INVEGA) 24 hr tablet 6 mg  6 mg Oral QHS Antonieta Pert, MD      . traZODone (DESYREL) tablet 100 mg  100 mg Oral QHS PRN Antonieta Pert, MD   100 mg at 02/03/20 2132    Lab Results:  Results for orders placed or  performed during the hospital encounter of 02/02/20 (from the past 48 hour(s))  Hemoglobin A1c     Status: None   Collection Time: 02/03/20  6:14 PM  Result Value Ref Range   Hgb A1c MFr Bld 5.5 4.8 - 5.6 %    Comment: (NOTE) Pre diabetes:          5.7%-6.4%  Diabetes:              >6.4%  Glycemic control for   <7.0% adults with diabetes    Mean Plasma Glucose 111.15 mg/dL    Comment: Performed at [facility], [address] [place], [place] [address]  Lipid panel     Status: Abnormal   Collection Time: 02/03/20  6:14 PM  Result Value Ref Range   Cholesterol 172 0 - 200 mg/dL   Triglycerides 604202 (H) <150 mg/dL   HDL 35 (L) >54>40 mg/dL   Total CHOL/HDL Ratio 4.9 RATIO   VLDL 40 0 - 40 mg/dL   LDL Cholesterol 97 0 - 99 mg/dL    Comment:        Total Cholesterol/HDL:CHD Risk Coronary Heart Disease Risk Table                     Men   Women  1/2 Average Risk   3.4   3.3  Average Risk       5.0   4.4  2 X Average Risk   9.6   7.1  3 X Average Risk  23.4   11.0        Use the calculated Patient Ratio above and the CHD Risk Table to determine the patient's CHD Risk.        ATP III CLASSIFICATION (LDL):  <100     mg/dL   Optimal  098-119100-129  mg/dL   Near or Above                    Optimal  130-159  mg/dL   Borderline  [id number]  mg/dL   High  >562>190     mg/dL   Very High Performed at [facility], [address] [place], [place] [address]   TSH     Status: None   Collection Time: 02/03/20  6:14 PM  Result Value Ref Range   TSH 1.137 0.350 - 4.500 uIU/mL    Comment: Performed by a 3rd Generation assay with a functional sensitivity of <=0.01 uIU/mL. Performed at [facility], [address] [place], [place] [address]     Blood Alcohol level:  Lab Results   Component Value Date   ETH <10 02/01/2020   ETH <10 01/10/2020    Metabolic Disorder Labs: Lab Results  Component Value Date   HGBA1C 5.5 02/03/2020   MPG 111.15 02/03/2020   MPG 102.54 06/01/2019   Lab Results  Component Value Date   PROLACTIN 8.1 06/01/2019   Lab Results  Component Value Date   CHOL 172 02/03/2020   TRIG 202 (H) 02/03/2020   HDL 35 (L) 02/03/2020   CHOLHDL 4.9 02/03/2020   VLDL 40 02/03/2020   LDLCALC 97 02/03/2020   LDLCALC 138 (H) 06/01/2019    Physical Findings: AIMS:  , ,  ,  ,    CIWA:    COWS:     Musculoskeletal: Strength & Muscle Tone: within normal limits Gait & Station: normal Patient leans: N/A  Psychiatric Specialty Exam: Physical Exam Vitals and nursing  note reviewed.  Constitutional:      Appearance: Normal appearance.  HENT:     Head: Normocephalic and atraumatic.  Pulmonary:     Effort: Pulmonary effort is normal.  Neurological:     General: No focal deficit present.     Mental Status: He is alert and oriented to person, place, and time.     Review of Systems  Blood pressure 130/76, pulse 62, temperature 98.5 F (36.9 C), temperature source Oral, resp. rate 18, height 6' (1.829 m), weight 107.5 kg, SpO2 100 %.Body mass index is 32.14 kg/m.  General Appearance: Casual  Eye Contact:  Good  Speech:  Normal Rate  Volume:  Normal  Mood:  Euthymic  Affect:  Flat  Thought Process:  Coherent and Descriptions of Associations: Loose  Orientation:  Full (Time, Place, and Person)  Thought Content:  Delusions and Hallucinations: Auditory  Suicidal Thoughts:  No  Homicidal Thoughts:  No  Memory:  Immediate;   Fair Recent;   Fair Remote;   Fair  Judgement:  Intact  Insight:  Lacking  Psychomotor Activity:  Normal  Concentration:  Concentration: Fair and Attention Span: Fair  Recall:  [employer] of Knowledge:  Fair  Language:  Fair  Akathisia:  Negative  Handed:  Right  AIMS (if indicated):     Assets:  Desire for  Improvement Housing Resilience  ADL's:  Intact  Cognition:  WNL  Sleep:  Number of Hours: 3     Treatment Plan Summary: Daily contact with patient to assess and evaluate symptoms and progress in treatment, Medication management and Plan : Patient is seen and examined.  Patient is a 44 year old male with the above-stated past psychiatric history who is seen in follow-up.   Diagnosis: 1.  Schizophrenia  Pertinent findings on examination today: 1.  Patient admitted to auditory hallucinations today, he is still having delusional thinking. 2.  He is sleeping a great deal during the day, and only sleeping approximately 3 hours last night. 3.  Vital signs are stable, he is afebrile. 4.  Denied suicidal or homicidal ideation. 5.  Nursing staff stated he staying in bed a great deal during the day.  Plan: 1.  Add oral paliperidone 6 mg p.o. nightly to aid with psychosis as well as sleep. 2.  Continue Prolixin 10 mg p.o. daily and 20 mg p.o. nightly for psychosis, mood stability and sleep. 3.  Patient received the long-acting paliperidone injection 234 mg on 01/27/2020. 4.  Continue trazodone 100 mg p.o. nightly as needed insomnia. 5.  Continue atorvastatin 20 mg p.o. daily for hyperlipidemia. 6.  Continue Cogentin 1 mg p.o. twice daily for side effects of Prolixin. 7.  We will continue to hold his amlodipine given his normotension. 8.  I have asked social work to contact his family for collateral information. 9.  Disposition planning-in progress.  Antonieta Pert, MD 02/04/2020, 10:34 AM

## 2020-02-04 NOTE — Progress Notes (Signed)
Pt up and pacing the halls. Pt talking to "[employer]." Pt given Haldol 10 mg PO. Redirected back to his room. Will continue to monitor.

## 2020-02-05 DIAGNOSIS — F200 Paranoid schizophrenia: Secondary | ICD-10-CM | POA: Diagnosis not present

## 2020-02-05 MED ORDER — PALIPERIDONE ER 3 MG PO TB24
9.0000 mg | ORAL_TABLET | Freq: Every day | ORAL | Status: DC
  Administered 2020-02-05 – 2020-02-07 (×3): 9 mg via ORAL
  Filled 2020-02-05 (×4): qty 3

## 2020-02-05 MED ORDER — PALIPERIDONE ER 3 MG PO TB24
3.0000 mg | ORAL_TABLET | ORAL | Status: AC
  Administered 2020-02-05: 3 mg via ORAL
  Filled 2020-02-05 (×2): qty 1

## 2020-02-05 MED ORDER — CHLORPROMAZINE HCL 50 MG PO TABS
50.0000 mg | ORAL_TABLET | Freq: Three times a day (TID) | ORAL | Status: DC
  Administered 2020-02-05 (×3): 50 mg via ORAL
  Filled 2020-02-05: qty 2
  Filled 2020-02-05 (×7): qty 1

## 2020-02-05 MED ORDER — WHITE PETROLATUM EX OINT
TOPICAL_OINTMENT | CUTANEOUS | Status: AC
  Filled 2020-02-05: qty 10

## 2020-02-05 NOTE — Progress Notes (Signed)
[name]  02/05/2020 12:35 PM James SizerBrian T [name]  MRN:  [id number] Subjective:  Patient is a 44 year old male with a past psychiatric history significant for schizophrenia who was placed under involuntary commitment by his mother and transported by police to the [facility] emergency department on 02/01/2020. His mother stated the patient had been noncompliant with medications and was having auditory hallucinations as well as paranoia.  Objective: Patient is seen and examined.  Patient is a 44 year old male with the above-stated past psychiatric history who is seen in follow-up.  He still remains psychotic.  He is pleasantly psychotic but still psychotic.  His sleep did not improve last night.  Even with the medication changes that were done.  It appeared that he had internal stimuli overnight.  He is vague about whether is having auditory hallucinations.  He denied any suicidal or homicidal ideation.  His vital signs are stable, he is afebrile.  He only slept 3 hours last night.  We discussed whether or not he had ever had side effects to Thorazine, and he denied that.  No new laboratories.  Principal Problem: <principal problem not specified> Diagnosis: Active Problems:   Paranoid schizophrenia (HCC)  Total Time spent with patient: 20 minutes  Past Psychiatric History: See admission H&P  Past Medical History:  Past Medical History:  Diagnosis Date  . Bipolar disorder (HCC)   . Bronchitis   . Hyperlipidemia   . Paranoid schizophrenia (HCC)   . Schizophrenia [facility](HCC)     Past Surgical History:  Procedure Laterality Date  . TESTICLE IMPLANTATION TO THIGH    . TESTICLE SURGERY     Family History:  Family History  Problem Relation Age of Onset  . Schizophrenia Neg Hx    Family Psychiatric  History: See admission H&P Social History:  Social History   Substance and Sexual Activity  Alcohol Use Not Currently   Comment: weekly     Social History   Substance and Sexual  Activity  Drug Use Not Currently  . Types: Marijuana    Social History   Socioeconomic History  . Marital status: Single    Spouse name: Not on file  . Number of children: Not on file  . Years of education: 12 years  . Highest education level: 12th grade  Occupational History  . Occupation: On disability  Tobacco Use  . Smoking status: Current Every Day Smoker    Packs/day: 1.00    Types: Cigarettes  . Smokeless tobacco: Never Used  Vaping Use  . Vaping Use: Never used  Substance and Sexual Activity  . Alcohol use: Not Currently    Comment: weekly  . Drug use: Not Currently    Types: Marijuana  . Sexual activity: Yes    Birth control/protection: Condom  Other Topics Concern  . Not on file  Social History Narrative   Pt lives alone in apartment complex for mentally ill   Social Determinants of Health   Financial Resource Strain:   . Difficulty of Paying Living Expenses: Not on file  Food Insecurity:   . Worried About Programme researcher, broadcasting/film/videounning Out of Food in the Last Year: Not on file  . Ran Out of Food in the Last Year: Not on file  Transportation Needs:   . Lack of Transportation (Medical): Not on file  . Lack of Transportation (Non-Medical): Not on file  Physical Activity:   . Days of Exercise per Week: Not on file  . Minutes of Exercise per Session: Not on file  Stress:   . Feeling of Stress : Not on file  Social Connections:   . Frequency of Communication with Friends and Family: Not on file  . Frequency of Social Gatherings with Friends and Family: Not on file  . Attends Religious Services: Not on file  . Active Member of Clubs or Organizations: Not on file  . Attends Banker Meetings: Not on file  . Marital Status: Not on file   Additional Social History:                         Sleep: Poor  Appetite:  Good  Current Medications: Current Facility-Administered Medications  Medication Dose Route Frequency Provider Last Rate Last Admin  .  acetaminophen (TYLENOL) tablet 650 mg  650 mg Oral Q6H PRN Nwoko, Uchenna E, PA   650 mg at 02/04/20 2341  . alum & mag hydroxide-simeth (MAALOX/MYLANTA) 200-200-20 MG/5ML suspension 30 mL  30 mL Oral Q4H PRN Nwoko, Uchenna E, PA   30 mL at 02/02/20 2101  . benztropine (COGENTIN) tablet 1 mg  1 mg Oral BID Antonieta Pert, MD   1 mg at 02/05/20 2229  . chlorproMAZINE (THORAZINE) tablet 50 mg  50 mg Oral TID Antonieta Pert, MD   50 mg at 02/05/20 1124  . haloperidol lactate (HALDOL) injection 10 mg  10 mg Intramuscular Q6H PRN Antonieta Pert, MD       Or  . haloperidol (HALDOL) tablet 10 mg  10 mg Oral Q6H PRN Antonieta Pert, MD   10 mg at 02/04/20 2131  . hydrOXYzine (ATARAX/VISTARIL) tablet 25 mg  25 mg Oral TID PRN Karel Jarvis E, PA   25 mg at 02/03/20 2132  . LORazepam (ATIVAN) injection 1 mg  1 mg Intramuscular Q6H PRN Nwoko, Uchenna E, PA       Or  . LORazepam (ATIVAN) tablet 1 mg  1 mg Oral Q6H PRN Nwoko, Uchenna E, PA   1 mg at 02/04/20 2344  . magnesium hydroxide (MILK OF MAGNESIA) suspension 30 mL  30 mL Oral Daily PRN Nwoko, Uchenna E, PA      . paliperidone (INVEGA) 24 hr tablet 9 mg  9 mg Oral QHS Antonieta Pert, MD      . traZODone (DESYREL) tablet 100 mg  100 mg Oral QHS PRN Antonieta Pert, MD   100 mg at 02/03/20 2132    Lab Results:  Results for orders placed or performed during the hospital encounter of 02/02/20 (from the past 48 hour(s))  Hemoglobin A1c     Status: None   Collection Time: 02/03/20  6:14 PM  Result Value Ref Range   Hgb A1c MFr Bld 5.5 4.8 - 5.6 %    Comment: (NOTE) Pre diabetes:          5.7%-6.4%  Diabetes:              >6.4%  Glycemic control for   <7.0% adults with diabetes    Mean Plasma Glucose 111.15 mg/dL    Comment: Performed at [facility], [address] [place], [place] [address]  Lipid panel     Status: Abnormal   Collection Time: 02/03/20  6:14 PM  Result Value Ref Range   Cholesterol 172 0 - 200  mg/dL   Triglycerides 119 (H) <150 mg/dL   HDL 35 (L) >41 mg/dL   Total CHOL/HDL Ratio 4.9 RATIO   VLDL 40 0 -  40 mg/dL   LDL Cholesterol 97 0 - 99 mg/dL    Comment:        Total Cholesterol/HDL:CHD Risk Coronary Heart Disease Risk Table                     Men   Women  1/2 Average Risk   3.4   3.3  Average Risk       5.0   4.4  2 X Average Risk   9.6   7.1  3 X Average Risk  23.4   11.0        Use the calculated Patient Ratio above and the CHD Risk Table to determine the patient's CHD Risk.        ATP III CLASSIFICATION (LDL):  <100     mg/dL   Optimal  185-631  mg/dL   Near or Above                    Optimal  130-159  mg/dL   Borderline  497-026  mg/dL   High  >378     mg/dL   Very High Performed at [facility] [address] [place], [place] [address]   TSH     Status: None   Collection Time: 02/03/20  6:14 PM  Result Value Ref Range   TSH 1.137 0.350 - 4.500 uIU/mL    Comment: Performed by a 3rd Generation assay with a functional sensitivity of <=0.01 uIU/mL. Performed at [facility], [address], [place], [place] [address]     Blood Alcohol level:  Lab Results  Component Value Date   ETH <10 02/01/2020   ETH <10 01/10/2020    Metabolic Disorder Labs: Lab Results  Component Value Date   HGBA1C 5.5 02/03/2020   MPG 111.15 02/03/2020   MPG 102.54 06/01/2019   Lab Results  Component Value Date   PROLACTIN 8.1 06/01/2019   Lab Results  Component Value Date   CHOL 172 02/03/2020   TRIG 202 (H) 02/03/2020   HDL 35 (L) 02/03/2020   CHOLHDL 4.9 02/03/2020   VLDL 40 02/03/2020   LDLCALC 97 02/03/2020   LDLCALC 138 (H) 06/01/2019    Physical Findings: AIMS:  , ,  ,  ,    CIWA:    COWS:     Musculoskeletal: Strength & Muscle Tone: within normal limits Gait & Station: normal Patient leans: N/A  Psychiatric Specialty Exam: Physical Exam Vitals and nursing note reviewed.  Constitutional:       Appearance: Normal appearance.  HENT:     Head: Normocephalic and atraumatic.  Pulmonary:     Effort: Pulmonary effort is normal.  Neurological:     General: No focal deficit present.     Mental Status: He is alert and oriented to person, place, and time.     Review of Systems  Musculoskeletal: Positive for arthralgias.  All other systems reviewed and are negative.   Blood pressure (!) 121/91, pulse 74, temperature 98.4 F (36.9 C), temperature source Oral, resp. rate 18, height 6' (1.829 m), weight 107.5 kg, SpO2 100 %.Body mass index is 32.14 kg/m.  General Appearance: Casual  Eye Contact:  Good  Speech:  Normal Rate  Volume:  Normal  Mood:  Euthymic  Affect:  Congruent  Thought Process:  Coherent and Descriptions of Associations: Loose  Orientation:  Full (Time, Place, and Person)  Thought Content:  Delusions and Hallucinations: Auditory  Suicidal Thoughts:  No  Homicidal Thoughts:  No  Memory:  Immediate;   Poor Recent;   Poor Remote;   Poor  Judgement:  Intact  Insight:  Fair  Psychomotor Activity:  Normal  Concentration:  Concentration: Fair and Attention Span: Fair  Recall:  [employer] of Knowledge:  Fair  Language:  Fair  Akathisia:  Negative  Handed:  Right  AIMS (if indicated):     Assets:  Desire for Improvement Resilience  ADL's:  Intact  Cognition:  WNL  Sleep:  Number of Hours: 3     Treatment Plan Summary: Daily contact with patient to assess and evaluate symptoms and progress in treatment, Medication management and Plan : Patient is seen and examined.  Patient is a 44 year old male with the above-stated past psychiatric history who is seen in follow-up.   Diagnosis: 1.  Schizophrenia  Pertinent findings on examination today: 1.  Continued auditory hallucinations but somewhat decreased. 2.  Continued paranoid delusions but stable. 3.  Sleep is still poor with 3 hours a night.  Plan: 1.  Increase paliperidone to 9 mg p.o. nightly to aid with  psychosis as well as sleep. 2.  Stop Prolixin. 3.  Add Thorazine 50 mg p.o. 3 times daily for psychosis and mood stability. 4.  Continue Haldol agitation protocol as needed. 5.  Continue trazodone 100 mg p.o. nightly as needed insomnia. 6.  Discussed with social work possibility of placing in a group home after discharge. 7.  Patient received the paliperidone long-acting injectable 234 mg on 9/30. 8.  Disposition planning-in progress.  Antonieta Pert, MD 02/05/2020, 12:35 PM

## 2020-02-05 NOTE — Progress Notes (Signed)
Pt is still psychotic and disorganized, talking of Donn Pierini not being dead but changed his face because he has a lot of money. Pt requires constant redirection and encouragement. Pt medicated as ordered and went to bed, but could not stay a sleep. Complained left ankle pain, tylenol given with good result, will continue to monitor.

## 2020-02-05 NOTE — Progress Notes (Signed)
Patient denies SI/HI. He denies AVH. He denies feeling depressed, or anxious today. He stated that the police was called to his apartment because he was crying and upset about what was happening in his apartment. He would not provide further details. He appeared somewhat paranoid during the assessment.   Orders reviewed. Vital signs reviewed. Verbal support provided. Pt encouraged to attend groups. 15 minute checks performed for safety.   Patient compliant with taking medications and denies any medication side effects.

## 2020-02-06 MED ORDER — CHLORPROMAZINE HCL 50 MG PO TABS
50.0000 mg | ORAL_TABLET | Freq: Two times a day (BID) | ORAL | Status: DC
  Administered 2020-02-06 – 2020-02-07 (×2): 50 mg via ORAL
  Filled 2020-02-06: qty 1
  Filled 2020-02-06: qty 2
  Filled 2020-02-06 (×3): qty 1

## 2020-02-06 MED ORDER — CHLORPROMAZINE HCL 100 MG PO TABS
100.0000 mg | ORAL_TABLET | Freq: Three times a day (TID) | ORAL | Status: DC
  Administered 2020-02-06: 100 mg via ORAL
  Filled 2020-02-06 (×4): qty 1

## 2020-02-06 MED ORDER — CHLORPROMAZINE HCL 100 MG PO TABS
100.0000 mg | ORAL_TABLET | Freq: Every day | ORAL | Status: DC
  Administered 2020-02-06: 100 mg via ORAL
  Filled 2020-02-06 (×2): qty 1
  Filled 2020-02-06: qty 4

## 2020-02-06 NOTE — Progress Notes (Signed)
D:  Patient's self inventory sheet, patient sleeps good, no sleep medication.  Good appetite, high energy level, good concentration.  Denied depression, hopeless and anxiety.  Denied withdrawals.  Denied SI.  Denied physical problems.  Denied physical pain.  Goal is get a job.  Plans to cooperative.  Does have discharge plans. A:  Medications administered per MD orders.  Emotional support and encouragement given patient. R:  Denied SI and HI, contracts for safety.  Denied A/V hallucinations.  Safety maintained with 15 minute checks.

## 2020-02-06 NOTE — [facility] Group Notes (Signed)
Adult Psychoeducational Group Not Date:  02/06/2020 Time:  0900-1045 Group Topic/Focus: PROGRESSIVE RELAXATION. A group where deep breathing is taught and tensing and relaxation muscle groups is used. Imagery is used as well.  Pts are asked to imagine 3 pillars that hold them up when they are not able to hold themselves up.  Participation Level:  Active  Participation Quality:  Appropriate  Affect:  Appropriate  Cognitive:  Oriented  Insight: Improving  Engagement in Group:  Engaged  Modes of Intervention:  Activity, Discussion, Education, and Support  Additional Comments:  Rates his energy as a 5. States that [facility] faith and the Father, help to keep him up when he is having trouble/  Dione Housekeeper 02/06/2020
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
## 2020-02-06 NOTE — Progress Notes (Signed)
Pt has been up most of the night despite taking sleep medication. Pt kept asking for ginger el all night. Pt talking about not going to jail, asking who called the police on him. Pt has been responding to internal stimuli. Pt maintained on routine checks, will continue to monitor.

## 2020-02-07 MED ORDER — OXCARBAZEPINE 150 MG PO TABS
150.0000 mg | ORAL_TABLET | Freq: Every day | ORAL | Status: DC
  Administered 2020-02-07: 150 mg via ORAL
  Filled 2020-02-07 (×3): qty 1

## 2020-02-07 MED ORDER — WHITE PETROLATUM EX OINT
TOPICAL_OINTMENT | CUTANEOUS | Status: AC
  Filled 2020-02-07: qty 5

## 2020-02-07 MED ORDER — OXCARBAZEPINE 300 MG PO TABS
300.0000 mg | ORAL_TABLET | Freq: Every day | ORAL | Status: DC
  Administered 2020-02-07: 300 mg via ORAL
  Filled 2020-02-07 (×2): qty 1

## 2020-02-07 NOTE — Progress Notes (Signed)
   02/07/20 2225  Psych Admission Type (Psych Patients Only)  Admission Status Involuntary  Psychosocial Assessment  Patient Complaints None  Eye Contact Fair  Facial Expression Flat  Affect Appropriate to circumstance  Speech Logical/coherent  Interaction Assertive  Motor Activity Pacing  Appearance/Hygiene Unremarkable  Behavior Characteristics Cooperative  Mood Pleasant  Aggressive Behavior  Effect No apparent injury  Thought Process  Coherency Concrete thinking  Content WDL  Delusions None reported or observed  Perception WDL  Hallucination None reported or observed  Judgment Limited  Confusion None  Danger to Self  Current suicidal ideation? Denies  Danger to Others  Danger to Others None reported or observed  D: Patient in room eating on approach.  Pt reports he is tolerating medication well.  A: Medications administered as prescribed. Support and encouragement provided as needed.  R: Patient remains safe on the unit. Will continue to monitor for safety and stability.

## 2020-02-07 NOTE — [facility] Counselor (Signed)
CSW called and left voice message with Floydene Flock [facility] in [place] to inform that pt is discharging 02/08/20.   Fredirick Lathe, LCSWA Clinicial Social Worker [employer]

## 2020-02-07 NOTE — Progress Notes (Signed)
Pt has been up most of the night despite having taken 100 mg of Trazodone and 25 mg of Vistaril. Pt constant asking for something to drink. Although pt has been calm and not mumbling a lot. Pt remains safe on the unit, will continue to monitor.
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
## 2020-02-07 NOTE — [facility] Suicide Risk Assessment (Signed)
[facility] INPATIENT:  Family/Significant Other Suicide Prevention Education  Suicide Prevention Education:  Education Completed; [name] [phone] (Mother) has been identified by the patient as the family member/significant other with whom the patient will be residing, and identified as the person(s) who will aid the patient in the event of a mental health crisis (suicidal ideations/suicide attempt).  With written consent from the patient, the family member/significant other has been provided the following suicide prevention education, prior to the and/or following the discharge of the patient.  The suicide prevention education provided includes the following:  Suicide risk factors  Suicide prevention and interventions  National Suicide Hotline telephone number  [facility] assessment telephone number  [facility] Emergency Assistance 911  [facility] and/or Residential Mobile Crisis Unit telephone number  Request made of family/significant other to:  Remove weapons (e.g., guns, rifles, knives), all items previously/currently identified as safety concern.    Remove drugs/medications (over-the-counter, prescriptions, illicit drugs), all items previously/currently identified as a safety concern.  The family member/significant other verbalizes understanding of the suicide prevention education information provided.  The family member/significant other agrees to remove the items of safety concern listed above.   Mrs. [name] confirms that [name] does have payee services and that he does not have a legal guardian at this time.  Mrs. [name] states that [name] began talking about odd things before coming to the hospital and she believes he has not been taking his medications.  Mrs. [name] states that the home [name] has ben living in is falling apart and states that he lives with two other roommates.  Mrs. [name] states that [name] was working with an ACTT team in [place]  and no longer wishes to work with them because one of the team member is "gay".  Mrs. [name] states that she believes that [name] needs a new therapist or ACTT team that he is comfortable with.  Mrs. [name] states that there are no firearms or weapons in the home and that she is not concerned at this time that [name] would hurt himself intentionally.      [name] 02/07/2020, 9:39 AM

## 2020-02-07 NOTE — Progress Notes (Signed)
Recreation Therapy Notes  Date:  10.11.21 Time: 0930 Location: [address] Dayroom  Group Topic: Stress Management  Goal Area(s) Addresses:  Patient will identify positive stress management techniques. Patient will identify benefits of using stress management post d/c.  Intervention: Stress Management  Activity : Meditation.  LRT played a meditation that focused on making the most of each moment of the day.  Patients are to listen and follow along as meditation is played.   Education:  Stress Management, Discharge Planning.   Education Outcome: Acknowledges Education  Clinical Observations/Feedback: Pt did not attend group.    Caroll Rancher, LRT/CTRS         Caroll Rancher A 02/07/2020 12:38 PM

## 2020-02-07 NOTE — Progress Notes (Signed)
[facility] MD Progress Note  02/07/2020 10:58 AM [name]  MRN:  [id number] Subjective:  Patient is a 44 year old male with a past psychiatric history significant for schizophrenia who was placed under involuntary commitment by his mother and transported by police to the [facility] emergency department on 02/01/2020. His mother stated the patient had been noncompliant with medications and was having auditory hallucinations as well as paranoia.  Objective: Patient is seen and examined.  Patient is a 44 year old male with the above-stated past psychiatric history who is seen in follow-up.  He is basically unchanged today.  His sleep continues to be poor.  His delusional thinking is pretty much at his baseline.  Information from social work found out that the patient apparently shares an apartment with 2 other men.  I asked him about this, and he stated he did not live with anyone else and that he lived independently.  We had discussed the possibility of a group home yesterday and he was open to that.  He is at times disorganized, but very pleasant throughout.  He denied any current auditory or visual hallucinations.  He denied any suicidal or homicidal ideation.  His vital signs are stable, he is afebrile.  He stated he slept well, but nursing notes show he only slept 3 hours last night.  His EKG from yesterday showed a normal sinus rhythm with a QTc interval of 417.  Principal Problem: <principal problem not specified> Diagnosis: Active Problems:   Paranoid schizophrenia (HCC)  Total Time spent with patient: 20 minutes  Past Psychiatric History: See admission H&P  Past Medical History:  Past Medical History:  Diagnosis Date  . Bipolar disorder (HCC)   . Bronchitis   . Hyperlipidemia   . Paranoid schizophrenia (HCC)   . Schizophrenia [facility])     Past Surgical History:  Procedure Laterality Date  . TESTICLE IMPLANTATION TO THIGH    . TESTICLE SURGERY     Family History:  Family History   Problem Relation Age of Onset  . Schizophrenia Neg Hx    Family Psychiatric  History: See admission H&P Social History:  Social History   Substance and Sexual Activity  Alcohol Use Not Currently   Comment: weekly     Social History   Substance and Sexual Activity  Drug Use Not Currently  . Types: Marijuana    Social History   Socioeconomic History  . Marital status: Single    Spouse name: Not on file  . Number of children: Not on file  . Years of education: 12 years  . Highest education level: 12th grade  Occupational History  . Occupation: On disability  Tobacco Use  . Smoking status: Current Every Day Smoker    Packs/day: 1.00    Types: Cigarettes  . Smokeless tobacco: Never Used  Vaping Use  . Vaping Use: Never used  Substance and Sexual Activity  . Alcohol use: Not Currently    Comment: weekly  . Drug use: Not Currently    Types: Marijuana  . Sexual activity: Yes    Birth control/protection: Condom  Other Topics Concern  . Not on file  Social History Narrative   Pt lives alone in apartment complex for mentally ill   Social Determinants of Health   Financial Resource Strain:   . Difficulty of Paying Living Expenses: Not on file  Food Insecurity:   . Worried About Programme researcher, broadcasting/film/video in the Last Year: Not on file  . Ran Out of Food in  the Last Year: Not on file  Transportation Needs:   . Lack of Transportation (Medical): Not on file  . Lack of Transportation (Non-Medical): Not on file  Physical Activity:   . Days of Exercise per Week: Not on file  . Minutes of Exercise per Session: Not on file  Stress:   . Feeling of Stress : Not on file  Social Connections:   . Frequency of Communication with Friends and Family: Not on file  . Frequency of Social Gatherings with Friends and Family: Not on file  . Attends Religious Services: Not on file  . Active Member of Clubs or Organizations: Not on file  . Attends Banker Meetings: Not on file   . Marital Status: Not on file   Additional Social History:                         Sleep: Poor  Appetite:  Good  Current Medications: Current Facility-Administered Medications  Medication Dose Route Frequency Provider Last Rate Last Admin  . acetaminophen (TYLENOL) tablet 650 mg  650 mg Oral Q6H PRN Nwoko, Uchenna E, PA   650 mg at 02/07/20 0805  . alum & mag hydroxide-simeth (MAALOX/MYLANTA) 200-200-20 MG/5ML suspension 30 mL  30 mL Oral Q4H PRN Nwoko, Uchenna E, PA   30 mL at 02/02/20 2101  . benztropine (COGENTIN) tablet 1 mg  1 mg Oral BID Antonieta Pert, MD   1 mg at 02/07/20 0804  . haloperidol lactate (HALDOL) injection 10 mg  10 mg Intramuscular Q6H PRN Antonieta Pert, MD       Or  . haloperidol (HALDOL) tablet 10 mg  10 mg Oral Q6H PRN Antonieta Pert, MD   10 mg at 02/04/20 2131  . hydrOXYzine (ATARAX/VISTARIL) tablet 25 mg  25 mg Oral TID PRN Karel Jarvis E, PA   25 mg at 02/06/20 2120  . LORazepam (ATIVAN) injection 1 mg  1 mg Intramuscular Q6H PRN Nwoko, Uchenna E, PA       Or  . LORazepam (ATIVAN) tablet 1 mg  1 mg Oral Q6H PRN Nwoko, Uchenna E, PA   1 mg at 02/06/20 0739  . magnesium hydroxide (MILK OF MAGNESIA) suspension 30 mL  30 mL Oral Daily PRN Nwoko, Uchenna E, PA      . OXcarbazepine (TRILEPTAL) tablet 150 mg  150 mg Oral Daily Antonieta Pert, MD      . Oxcarbazepine (TRILEPTAL) tablet 300 mg  300 mg Oral QHS Antonieta Pert, MD      . paliperidone (INVEGA) 24 hr tablet 9 mg  9 mg Oral QHS Antonieta Pert, MD   9 mg at 02/06/20 2119  . traZODone (DESYREL) tablet 100 mg  100 mg Oral QHS PRN Antonieta Pert, MD   100 mg at 02/06/20 2120  . white petrolatum (VASELINE) gel             Lab Results: No results found for this or any previous visit (from the past 48 hour(s)).  Blood Alcohol level:  Lab Results  Component Value Date   [facility] <10 02/01/2020   ETH <10 01/10/2020    Metabolic Disorder Labs: Lab Results  Component  Value Date   HGBA1C 5.5 02/03/2020   MPG 111.15 02/03/2020   MPG 102.54 06/01/2019   Lab Results  Component Value Date   PROLACTIN 8.1 06/01/2019   Lab Results  Component Value Date   CHOL 172 02/03/2020  TRIG 202 (H) 02/03/2020   HDL 35 (L) 02/03/2020   CHOLHDL 4.9 02/03/2020   VLDL 40 02/03/2020   LDLCALC 97 02/03/2020   LDLCALC 138 (H) 06/01/2019    Physical Findings: AIMS:  , ,  ,  ,    CIWA:    COWS:     Musculoskeletal: Strength & Muscle Tone: within normal limits Gait & Station: normal Patient leans: N/A  Psychiatric Specialty Exam: Physical Exam Vitals and nursing note reviewed.  Constitutional:      Appearance: Normal appearance.  HENT:     Head: Normocephalic and atraumatic.  Pulmonary:     Effort: Pulmonary effort is normal.  Neurological:     General: No focal deficit present.     Mental Status: He is alert and oriented to person, place, and time.     Review of Systems  Blood pressure 108/74, pulse (!) 102, temperature 98.3 F (36.8 C), temperature source Oral, resp. rate 18, height 6' (1.829 m), weight 107.5 kg, SpO2 99 %.Body mass index is 32.14 kg/m.  General Appearance: Casual  Eye Contact:  Good  Speech:  Normal Rate  Volume:  Normal  Mood:  Euthymic  Affect:  Congruent  Thought Process:  Disorganized and Descriptions of Associations: Loose  Orientation:  Full (Time, Place, and Person)  Thought Content:  Delusions and Paranoid Ideation  Suicidal Thoughts:  No  Homicidal Thoughts:  No  Memory:  Immediate;   Fair Recent;   Fair Remote;   Fair  Judgement:  Intact  Insight:  Fair  Psychomotor Activity:  Normal  Concentration:  Concentration: Good and Attention Span: Good  Recall:  Good  Fund of Knowledge:  Fair  Language:  Fair  Akathisia:  Negative  Handed:  Right  AIMS (if indicated):     Assets:  Desire for Improvement Resilience  ADL's:  Intact  Cognition:  WNL  Sleep:  Number of Hours: 3     Treatment Plan  Summary: Daily contact with patient to assess and evaluate symptoms and progress in treatment, Medication management and Plan : Patient is seen and examined.  Patient is a 44 year old male with the above-stated past psychiatric history who is seen in follow-up.  Diagnosis: 1. Schizophrenia  Pertinent findings on examination today: 1. Continued auditory hallucinations but decreasing. 2. Continued paranoid delusions but stable. 3. Sleep is still poor with 3 hours a night.  Plan: 1.  Continue Cogentin 1 mg p.o. twice daily. 2.  Stop Thorazine for lack of efficacy. 3.  Start Trileptal 150 mg p.o. daily and 300 mg p.o. nightly for mood stability as well as sleep and anxiety. 4.  Continue paliperidone 9 mg p.o. nightly for psychosis. 5.  Continue trazodone 100 mg p.o. nightly as needed insomnia. 6.  Social work obtained information from mother and she is requesting follow-up with a new an ACTT service at discharge. 7.  Disposition planning-in progress.  Antonieta Pert, MD 02/07/2020, 10:58 AM

## 2020-02-07 NOTE — Plan of Care (Signed)
Nurse discussed anxiety, depression and coping skills with patient.  

## 2020-02-08 MED ORDER — PALIPERIDONE ER 9 MG PO TB24
9.0000 mg | ORAL_TABLET | Freq: Every day | ORAL | 0 refills | Status: DC
Start: 2020-02-08 — End: 2020-02-24

## 2020-02-08 MED ORDER — OXCARBAZEPINE 300 MG PO TABS: 300.0000 mg | ORAL_TABLET | Freq: Two times a day (BID) | ORAL | 0 refills | Status: DC

## 2020-02-08 MED ORDER — TRAZODONE HCL 100 MG PO TABS
100.0000 mg | ORAL_TABLET | Freq: Every evening | ORAL | 0 refills | Status: DC | PRN
Start: 2020-02-08 — End: 2020-02-24

## 2020-02-08 MED ORDER — BENZTROPINE MESYLATE 1 MG PO TABS: 1.0000 mg | ORAL_TABLET | Freq: Two times a day (BID) | ORAL | 0 refills | Status: DC

## 2020-02-08 MED ORDER — OXCARBAZEPINE 300 MG PO TABS
300.0000 mg | ORAL_TABLET | Freq: Two times a day (BID) | ORAL | Status: DC
  Administered 2020-02-08: 300 mg via ORAL
  Filled 2020-02-08 (×3): qty 1

## 2020-02-08 MED ORDER — WHITE PETROLATUM EX OINT
TOPICAL_OINTMENT | CUTANEOUS | Status: AC
  Administered 2020-02-08: 1
  Filled 2020-02-08: qty 5

## 2020-02-08 MED ORDER — HYDROXYZINE HCL 25 MG PO TABS
25.0000 mg | ORAL_TABLET | Freq: Three times a day (TID) | ORAL | 0 refills | Status: DC | PRN
Start: 2020-02-08 — End: 2020-02-24

## 2020-02-08 NOTE — Discharge Summary (Signed)
Physician Discharge Summary Note  Patient:  [name] is an 44 y.o., male MRN:  [id number] DOB:  1975-08-03 Patient phone:  [phone] (home)  Patient address:   [address] [place] [address],  Total Time spent with patient: 15 minutes  Date of Admission:  02/02/2020 Date of Discharge: 02/08/20  Reason for Admission:  psychosis  Principal Problem: <principal problem not specified> Discharge Diagnoses: Active Problems:   Paranoid schizophrenia [facility])   Past Psychiatric History: He was apparently diagnosed with schizophrenia in 1998.  He reports at least 5-6 psychiatric hospitalizations in the past.  He has had multiple emergency department visits for psychosis.  His last psychiatric hospitalization that we are aware of in the electronic medical record was at [facility] on 11/21/2019.  He is unclear on how he got to the [facility] from our area.  At that time he was quite psychotic.  It stated in their admission note that he was well known in the [facility] emergency department.  He was seen there on 6/10 in which he was afraid of his apartment because someone was eating his food.  He is followed by ACTT service.  He has been previously treated with oral Abilify as well as long-acting Abilify injections, Wellbutrin, Cogentin, Prolixin, trazodone.  His discharge medications from [facility] included the long-acting Abilify injection.  The first dose was given on January 08, 2020.  He is also previously treated with Lipitor.  Past Medical History:  Past Medical History:  Diagnosis Date  . Bipolar disorder (HCC)   . Bronchitis   . Hyperlipidemia   . Paranoid schizophrenia (HCC)   . Schizophrenia [facility])     Past Surgical History:  Procedure Laterality Date  . TESTICLE IMPLANTATION TO THIGH    . TESTICLE SURGERY     Family History:  Family History  Problem Relation Age of Onset  . Schizophrenia Neg Hx    Family Psychiatric  History:  Denies Social History:  Social History   Substance and Sexual Activity  Alcohol Use Not Currently   Comment: weekly     Social History   Substance and Sexual Activity  Drug Use Not Currently  . Types: Marijuana    Social History   Socioeconomic History  . Marital status: Single    Spouse name: Not on file  . Number of children: Not on file  . Years of education: 12 years  . Highest education level: 12th grade  Occupational History  . Occupation: On disability  Tobacco Use  . Smoking status: Current Every Day Smoker    Packs/day: 1.00    Types: Cigarettes  . Smokeless tobacco: Never Used  Vaping Use  . Vaping Use: Never used  Substance and Sexual Activity  . Alcohol use: Not Currently    Comment: weekly  . Drug use: Not Currently    Types: Marijuana  . Sexual activity: Yes    Birth control/protection: Condom  Other Topics Concern  . Not on file  Social History Narrative   Pt lives alone in apartment complex for mentally ill   Social Determinants of Health   Financial Resource Strain:   . Difficulty of Paying Living Expenses: Not on file  Food Insecurity:   . Worried About Programme researcher, broadcasting/film/video in the Last Year: Not on file  . Ran Out of Food in the Last Year: Not on file  Transportation Needs:   . Lack of Transportation (Medical): Not on file  . Lack of  Transportation (Non-Medical): Not on file  Physical Activity:   . Days of Exercise per Week: Not on file  . Minutes of Exercise per Session: Not on file  Stress:   . Feeling of Stress : Not on file  Social Connections:   . Frequency of Communication with Friends and Family: Not on file  . Frequency of Social Gatherings with Friends and Family: Not on file  . Attends Religious Services: Not on file  . Active Member of Clubs or Organizations: Not on file  . Attends BankerClub or Organization Meetings: Not on file  . Marital Status: Not on file    Hospital Course:  From admission H&P: Patient is a 44 year old  male with a past psychiatric history significant for schizophrenia who was placed under involuntary commitment by his mother and transported by police to the [facility] emergency room on 02/01/2020. His mother stated that the patient had not been taking his medications. The patient stated that his apartment was haunted, and that he was hearing voices and seeing things. He also told the comprehensive clinical assessment team that he had been being followed by people around [employer], and was seeing and hearing "ghost" in his apartment. He denied any suicidal or homicidal ideation. He stated the reason why he was in the hospital today was because he was "trying to find a job". He was very vague about taking his medications. He stated "I got my injection on Friday". He was unable to recall whether or not he was taking any other medications. He was not a good historian. Review of the electronic medical record revealed his last psychiatric hospitalization in July of this year at [facility]. Prior to that hospitalization he was being treated with the long-acting Abilify injection, Prolixin, Topamax. On discharge from their facility he was started on the long-acting paliperidone injection. He received 234 mg on 12/08/2019. The Abilify, Prolixin and Topamax were all stopped. We are unsure whether or not the injection he received this previous Friday was paliperidone, Abilify or some other medication. He was admitted to the hospital for evaluation and stabilization.  [name] was admitted for psychotic symptoms as described above. [facility]. He was restarted on Invega, Cogentin, trazodone, Trileptal, and PRN Vistaril. Reportedly his last [place] Sustenna injection was on 01/27/20.He responded well to treatment with no adverse effects reported. He has shown stable mood and affect. Sleep and interaction have improved. He continues to have chronic delusions but has been calm  and cooperative on the unit. He denies any SI/HI/AVH and contracts for safety. He is discharging on the medications listed below. He agrees to follow up with [facility] team (see below). Patient is provided with prescriptions for medications upon discharge. His family is picking him up for discharge home.   Physical Findings: AIMS:  , ,  ,  ,    CIWA:    COWS:     Musculoskeletal: Strength & Muscle Tone: within normal limits Gait & Station: normal Patient leans: N/A  Psychiatric Specialty Exam: Physical Exam Vitals and nursing note reviewed.  Constitutional:      Appearance: He is well-developed.  Cardiovascular:     Rate and Rhythm: Normal rate.  Pulmonary:     Effort: Pulmonary effort is normal.  Neurological:     Mental Status: He is alert and oriented to person, place, and time.     Review of Systems  Constitutional: Negative.   Respiratory: Negative for  cough and shortness of breath.   Psychiatric/Behavioral: Negative for agitation, behavioral problems, confusion, decreased concentration, dysphoric mood, hallucinations, self-injury, sleep disturbance and suicidal ideas. The patient is not nervous/anxious and is not hyperactive.     Blood pressure 111/73, pulse 84, temperature 98 F (36.7 C), temperature source Oral, resp. rate 18, height 6' (1.829 m), weight 107.5 kg, SpO2 99 %.Body mass index is 32.14 kg/m.  See MD's discharge SRA      Has this patient used any form of tobacco in the last 30 days? (Cigarettes, Smokeless Tobacco, Cigars, and/or Pipes)  No  Blood Alcohol level:  Lab Results  Component Value Date   ETH <10 02/01/2020   ETH <10 01/10/2020    Metabolic Disorder Labs:  Lab Results  Component Value Date   HGBA1C 5.5 02/03/2020   MPG 111.15 02/03/2020   MPG 102.54 06/01/2019   Lab Results  Component Value Date   PROLACTIN 8.1 06/01/2019   Lab Results  Component Value Date   CHOL 172 02/03/2020   TRIG 202 (H) 02/03/2020   HDL 35  (L) 02/03/2020   CHOLHDL 4.9 02/03/2020   VLDL 40 02/03/2020   LDLCALC 97 02/03/2020   LDLCALC 138 (H) 06/01/2019    See Psychiatric Specialty Exam and Suicide Risk Assessment completed by Attending Physician prior to discharge.  Discharge destination:  Home  Is patient on multiple antipsychotic therapies at discharge:  No   Has Patient had three or more failed trials of antipsychotic monotherapy by history:  No  Recommended Plan for Multiple Antipsychotic Therapies: NA  Discharge Instructions    Discharge instructions   Complete by: As directed    Activity as tolerated. Diet as recommended by primary care physician. Keep all scheduled follow-up appointments as recommended.     Allergies as of 02/08/2020      Reactions   Other    Some trial medicine from [facility].       Medication List    STOP taking these medications   Abilify Maintena 400 MG Prsy prefilled syringe Generic drug: ARIPiprazole ER   atorvastatin 20 MG tablet Commonly known as: LIPITOR   buPROPion 150 MG 24 hr tablet Commonly known as: WELLBUTRIN XL   diphenhydrAMINE 25 MG tablet Commonly known as: BENADRYL   fluPHENAZine 10 MG tablet Commonly known as: PROLIXIN   topiramate 50 MG tablet Commonly known as: Topamax     TAKE these medications     Indication  benztropine 1 MG tablet Commonly known as: COGENTIN Take 1 tablet (1 mg total) by mouth 2 (two) times daily.  Indication: Extrapyramidal Reaction caused by Medications   hydrOXYzine 25 MG tablet Commonly known as: ATARAX/VISTARIL Take 1 tablet (25 mg total) by mouth 3 (three) times daily as needed for anxiety.  Indication: Feeling Anxious   Oxcarbazepine 300 MG tablet Commonly known as: TRILEPTAL Take 1 tablet (300 mg total) by mouth 2 (two) times daily.  Indication: Mood stabilization   paliperidone 234 MG/1.5ML Susy injection Commonly known as: INVEGA SUSTENNA Inject 234 mg into the muscle once.  Indication: Schizophrenia    paliperidone 9 MG 24 hr tablet Commonly known as: INVEGA Take 1 tablet (9 mg total) by mouth at bedtime.  Indication: Schizophrenia   traZODone 100 MG tablet Commonly known as: DESYREL Take 1 tablet (100 mg total) by mouth at bedtime as needed for sleep. What changed:   medication strength  how much to take  when to take this  reasons to take this  Indication: Trouble  Sleeping       Follow-up Information    [facility] Team Follow up.   Why: Continue to follow up for services with ACTT team up discharge. Contact information: [address] [place], [place] [address] [phone] ext [phone]              Follow-up recommendations: Activity as tolerated. Diet as recommended by primary care physician. Keep all scheduled follow-up appointments as recommended.   Comments:   Patient is instructed to take all prescribed medications as recommended. Report any side effects or adverse reactions to your outpatient psychiatrist. Patient is instructed to abstain from alcohol and illegal drugs while on prescription medications. In the event of worsening symptoms, patient is instructed to call the crisis hotline, 911, or go to the nearest emergency department for evaluation and treatment.  Signed: Aldean Baker, NP 02/08/2020, 9:29 AM

## 2020-02-08 NOTE — [facility] Counselor (Signed)
CSW contacted [facility] in [place].  CSW was informed that the ACTT team had received all the messages left by the team at [facility].  CSW  explained that [name] was being discharged today and that the ACTT team would need to begin looking at group home placements for [name] in the near future.  The CSW was told that another worker would call her back and the information could be provided then.  CSW received no return call [facility].

## 2020-02-08 NOTE — Progress Notes (Signed)
  [facility] Adult Case Management Discharge Plan :  Will you be returning to the same living situation after discharge:  Yes,  Own apartment  At discharge, do you have transportation home?: Yes,  Family member  Do you have the ability to pay for your medications: Yes,  Medicare   Release of information consent forms completed and in the chart;  Patient's signature needed at discharge.  Patient to Follow up at:  Follow-up Information    [facility] Team Follow up.   Why: Continue to follow up for services with ACTT team up discharge. Contact information: [address] [place], [place] [address] [phone] ext [phone]              Next level of care provider has access to [facility] Link:no  Safety Planning and Suicide Prevention discussed: Yes,  Mother and patient      Has patient been referred to the Quitline?: Patient refused referral  Patient has been referred for addiction treatment: N/A  [name], LCSWA 02/08/2020, 10:12 AM

## 2020-02-08 NOTE — Progress Notes (Signed)
D: Pt A & O X 3. Denies SI, HI and pain at this time. Per pt "I see [facility] and they talk to me. They were talking to me this morning". Affect is animated, interacts well with others, continues to pace per his baseline. Eye contact is fair. D/C home as ordered. Picked up in lobby by his mother. A: D/C instructions reviewed with pt and his mother per pt's request. D/C included electronic prescriptions and follow up appointment with pt's ACTT team; compliance encouraged. All belongings from locker 41 returned to pt at time of departure. Scheduled medications given with verbal education and effects monitored. Safety checks maintained without incident till time of d/c.  R: Pt receptive to care. Compliant with medications when offered. Denies adverse drug reactions when assessed. Verbalized understanding related to d/c instructions. Signed belonging sheet in agreement with items received from locker. Ambulatory with a steady gait. Appears to be in no physical distress at time of departure.

## 2020-02-08 NOTE — [facility] Suicide Risk Assessment (Signed)
[facility] Discharge Suicide Risk Assessment   Principal Problem: <principal problem not specified> Discharge Diagnoses: Active Problems:   Paranoid schizophrenia (HCC)   Total Time spent with patient: 20 minutes  Musculoskeletal: Strength & Muscle Tone: within normal limits Gait & Station: normal Patient leans: N/A  Psychiatric Specialty Exam: Review of Systems  Psychiatric/Behavioral: Positive for hallucinations.  All other systems reviewed and are negative.   Blood pressure 111/73, pulse 84, temperature 98 F (36.7 C), temperature source Oral, resp. rate 18, height 6' (1.829 m), weight 107.5 kg, SpO2 99 %.Body mass index is 32.14 kg/m.  General Appearance: Casual  Eye Contact::  Good  Speech:  Normal Rate409  Volume:  Normal  Mood:  Euthymic  Affect:  Congruent  Thought Process:  Disorganized and Descriptions of Associations: Loose  Orientation:  Full (Time, Place, and Person)  Thought Content:  Delusions  Suicidal Thoughts:  No  Homicidal Thoughts:  No  Memory:  Immediate;   Fair Recent;   Fair Remote;   Fair  Judgement:  Intact  Insight:  Fair  Psychomotor Activity:  Increased  Concentration:  Fair  Recall:  [employer] of Knowledge:Fair  Language: Fair  Akathisia:  Negative  Handed:  Right  AIMS (if indicated):     Assets:  Desire for Improvement Housing Resilience Social Support  Sleep:  Number of Hours: 3  Cognition: WNL  ADL's:  Intact   Mental Status Per Nursing Assessment::   On Admission:  NA  Demographic Factors:  Male, Low socioeconomic status and Unemployed  Loss Factors: NA  Historical Factors: Impulsivity  Risk Reduction Factors:   Living with another person, especially a relative and Positive social support  Continued Clinical Symptoms:  Schizophrenia:   Paranoid or undifferentiated type  Cognitive Features That Contribute To Risk:  Thought constriction (tunnel vision)    Suicide Risk:  Minimal: No identifiable suicidal ideation.   Patients presenting with no risk factors but with morbid ruminations; may be classified as minimal risk based on the severity of the depressive symptoms   Follow-up Information    [facility] Team Follow up.   Why: Continue to follow up for services with ACTT team up discharge. Contact information: [address] [place], [place] [address] [phone] ext [phone]              Plan Of Care/Follow-up recommendations:  Activity:  ad lib  Antonieta Pert, MD 02/08/2020, 9:13 AM

## 2020-02-23 ENCOUNTER — Emergency Department (HOSPITAL_COMMUNITY): Payer: Medicare HMO

## 2020-02-23 ENCOUNTER — Other Ambulatory Visit: Payer: Self-pay

## 2020-02-23 ENCOUNTER — Observation Stay (HOSPITAL_COMMUNITY)
Admission: EM | Admit: 2020-02-23 | Discharge: 2020-02-24 | Disposition: A | Discharge status: Home / Self Care | Payer: Medicare HMO | Attending: Family Medicine | Admitting: Family Medicine

## 2020-02-23 ENCOUNTER — Encounter (HOSPITAL_COMMUNITY): Payer: Self-pay

## 2020-02-23 DIAGNOSIS — T50902A Poisoning by unspecified drugs, medicaments and biological substances, intentional self-harm, initial encounter: Secondary | ICD-10-CM | POA: Diagnosis present

## 2020-02-23 DIAGNOSIS — R4182 Altered mental status, unspecified: Principal | ICD-10-CM | POA: Insufficient documentation

## 2020-02-23 DIAGNOSIS — F17210 Nicotine dependence, cigarettes, uncomplicated: Secondary | ICD-10-CM | POA: Insufficient documentation

## 2020-02-23 DIAGNOSIS — F200 Paranoid schizophrenia: Secondary | ICD-10-CM | POA: Insufficient documentation

## 2020-02-23 DIAGNOSIS — F250 Schizoaffective disorder, bipolar type: Secondary | ICD-10-CM | POA: Diagnosis present

## 2020-02-23 DIAGNOSIS — R404 Transient alteration of awareness: Secondary | ICD-10-CM | POA: Diagnosis not present

## 2020-02-23 DIAGNOSIS — J9811 Atelectasis: Secondary | ICD-10-CM | POA: Diagnosis not present

## 2020-02-23 DIAGNOSIS — F17200 Nicotine dependence, unspecified, uncomplicated: Secondary | ICD-10-CM | POA: Diagnosis present

## 2020-02-23 DIAGNOSIS — R410 Disorientation, unspecified: Secondary | ICD-10-CM | POA: Diagnosis not present

## 2020-02-23 DIAGNOSIS — Z20822 Contact with and (suspected) exposure to covid-19: Secondary | ICD-10-CM | POA: Diagnosis not present

## 2020-02-23 DIAGNOSIS — J3489 Other specified disorders of nose and nasal sinuses: Secondary | ICD-10-CM | POA: Diagnosis not present

## 2020-02-23 DIAGNOSIS — J189 Pneumonia, unspecified organism: Secondary | ICD-10-CM | POA: Diagnosis not present

## 2020-02-23 DIAGNOSIS — R4020 Unspecified coma: Secondary | ICD-10-CM | POA: Diagnosis not present

## 2020-02-23 DIAGNOSIS — T50904A Poisoning by unspecified drugs, medicaments and biological substances, undetermined, initial encounter: Secondary | ICD-10-CM | POA: Diagnosis present

## 2020-02-23 DIAGNOSIS — A419 Sepsis, unspecified organism: Secondary | ICD-10-CM | POA: Diagnosis not present

## 2020-02-23 DIAGNOSIS — R55 Syncope and collapse: Secondary | ICD-10-CM | POA: Diagnosis not present

## 2020-02-23 DIAGNOSIS — R531 Weakness: Secondary | ICD-10-CM | POA: Diagnosis not present

## 2020-02-23 DIAGNOSIS — R9431 Abnormal electrocardiogram [ECG] [EKG]: Secondary | ICD-10-CM | POA: Diagnosis not present

## 2020-02-23 DIAGNOSIS — F209 Schizophrenia, unspecified: Secondary | ICD-10-CM | POA: Diagnosis not present

## 2020-02-23 DIAGNOSIS — T50901A Poisoning by unspecified drugs, medicaments and biological substances, accidental (unintentional), initial encounter: Secondary | ICD-10-CM | POA: Diagnosis present

## 2020-02-23 LAB — APTT: aPTT: 21 seconds — ABNORMAL LOW (ref 24–36)

## 2020-02-23 LAB — CBC WITH DIFFERENTIAL/PLATELET
Abs Immature Granulocytes: 0.06 10*3/uL (ref 0.00–0.07)
Basophils Absolute: 0 10*3/uL (ref 0.0–0.1)
Basophils Relative: 0 %
Eosinophils Absolute: 0 10*3/uL (ref 0.0–0.5)
Eosinophils Relative: 0 %
HCT: 39.3 % (ref 39.0–52.0)
Hemoglobin: 13.7 g/dL (ref 13.0–17.0)
Immature Granulocytes: 0 %
Lymphocytes Relative: 5 %
Lymphs Abs: 0.8 10*3/uL (ref 0.7–4.0)
MCH: 30.4 pg (ref 26.0–34.0)
MCHC: 34.9 g/dL (ref 30.0–36.0)
MCV: 87.1 fL (ref 80.0–100.0)
Monocytes Absolute: 0.6 10*3/uL (ref 0.1–1.0)
Monocytes Relative: 4 %
Neutro Abs: 12.9 10*3/uL — ABNORMAL HIGH (ref 1.7–7.7)
Neutrophils Relative %: 91 %
Platelets: 286 10*3/uL (ref 150–400)
RBC: 4.51 MIL/uL (ref 4.22–5.81)
RDW: 12.1 % (ref 11.5–15.5)
WBC: 14.4 10*3/uL — ABNORMAL HIGH (ref 4.0–10.5)
nRBC: 0 % (ref 0.0–0.2)

## 2020-02-23 LAB — COMPREHENSIVE METABOLIC PANEL
ALT: 31 U/L (ref 0–44)
AST: 31 U/L (ref 15–41)
Albumin: 4.1 g/dL (ref 3.5–5.0)
Alkaline Phosphatase: 61 U/L (ref 38–126)
Anion gap: 7 (ref 5–15)
BUN: 18 mg/dL (ref 6–20)
CO2: 25 mmol/L (ref 22–32)
Calcium: 8.7 mg/dL — ABNORMAL LOW (ref 8.9–10.3)
Chloride: 102 mmol/L (ref 98–111)
Creatinine, Ser: 1.02 mg/dL (ref 0.61–1.24)
GFR, Estimated: >60 mL/min (ref >60)
Glucose, Bld: 148 mg/dL — ABNORMAL HIGH (ref 70–99)
Potassium: 3.9 mmol/L (ref 3.5–5.1)
Sodium: 134 mmol/L — ABNORMAL LOW (ref 135–145)
Total Bilirubin: 0.4 mg/dL (ref 0.3–1.2)
Total Protein: 7.1 g/dL (ref 6.5–8.1)

## 2020-02-23 LAB — URINALYSIS, ROUTINE W REFLEX MICROSCOPIC
Bilirubin Urine: NEGATIVE
Glucose, UA: NEGATIVE mg/dL
Hgb urine dipstick: NEGATIVE
Ketones, ur: NEGATIVE mg/dL
Leukocytes,Ua: NEGATIVE
Nitrite: NEGATIVE
Protein, ur: NEGATIVE mg/dL
Specific Gravity, Urine: 1.019 (ref 1.005–1.030)
pH: 5 (ref 5.0–8.0)

## 2020-02-23 LAB — SALICYLATE LEVEL: Salicylate Lvl: <7 mg/dL — ABNORMAL LOW (ref 7.0–30.0)

## 2020-02-23 LAB — RESPIRATORY PANEL BY RT PCR (FLU A&B, COVID)
Influenza A by PCR: NEGATIVE
Influenza B by PCR: NEGATIVE
SARS Coronavirus 2 by RT PCR: NEGATIVE

## 2020-02-23 LAB — ETHANOL: Alcohol, Ethyl (B): <10 mg/dL (ref <10)

## 2020-02-23 LAB — ACETAMINOPHEN LEVEL: Acetaminophen (Tylenol), Serum: <10 ug/mL — ABNORMAL LOW (ref 10–30)

## 2020-02-23 LAB — CBG MONITORING, ED: Glucose-Capillary: 137 mg/dL — ABNORMAL HIGH (ref 70–99)

## 2020-02-23 LAB — PROTIME-INR
INR: 1 (ref 0.8–1.2)
Prothrombin Time: 12.6 seconds (ref 11.4–15.2)

## 2020-02-23 LAB — RAPID URINE DRUG SCREEN, HOSP PERFORMED
Amphetamines: NOT DETECTED
Barbiturates: NOT DETECTED
Benzodiazepines: NOT DETECTED
Cocaine: NOT DETECTED
Opiates: NOT DETECTED
Tetrahydrocannabinol: NOT DETECTED

## 2020-02-23 LAB — AMMONIA: Ammonia: 17 umol/L (ref 9–35)

## 2020-02-23 LAB — MAGNESIUM: Magnesium: 2 mg/dL (ref 1.7–2.4)

## 2020-02-23 LAB — LACTIC ACID, PLASMA
Lactic Acid, Venous: 2 mmol/L (ref 0.5–1.9)
Lactic Acid, Venous: 1.6 mmol/L (ref 0.5–1.9)

## 2020-02-23 MED ORDER — SODIUM CHLORIDE 0.9 % IV SOLN: INTRAVENOUS | Status: DC

## 2020-02-23 MED ORDER — SODIUM CHLORIDE 0.9 % IV BOLUS
1000.0000 mL | Freq: Once | INTRAVENOUS | Status: AC
  Administered 2020-02-23: 1000 mL via INTRAVENOUS

## 2020-02-23 MED ORDER — HEPARIN SODIUM (PORCINE) 5000 UNIT/ML IJ SOLN
5000.0000 [IU] | Freq: Three times a day (TID) | INTRAMUSCULAR | Status: DC
  Administered 2020-02-23 – 2020-02-24 (×2): 5000 [IU] via SUBCUTANEOUS
  Filled 2020-02-23 (×2): qty 1

## 2020-02-23 MED ORDER — NALOXONE HCL 0.4 MG/ML IJ SOLN
0.4000 mg | Freq: Once | INTRAMUSCULAR | Status: AC
  Administered 2020-02-23: 0.4 mg via INTRAVENOUS
  Filled 2020-02-23: qty 1

## 2020-02-23 NOTE — H&P (Signed)
[facility] H&P   Patient Demographics:    [name], is a 44 y.o. male  MRN: [id number]   DOB - Aug 10, 1975  Admit Date - 02/23/2020  Outpatient Primary MD for the patient is Avon Gully, MD  Referring MD/NP/PA: PA Britni  Patient coming from: home  Chief Complaint  Patient presents with  . Altered Mental Status      HPI:    [name]  is a 44 y.o. male, with past medical history of paranoid schizophrenia, bipolar disorder, with admissions in the past at behavioral health due to suicidal ideations, patient is currently altered, unable to provide any reliable history, so history was obtained from ED staff and mother by phone. -Patient lives by himself, he had recent hospitalization at psychiatric facility and discharged on 10/12 for psychosis,, where his psychiatric regimen has been changed by, Wellbutrin, Benadryl, Prolixin, Topamax were stopped and he was started on Cogentin, Trileptal, Invega and trazodone, but mother confirmed patient did not dispense his new medications yet, but as well she is unclear if he is having any of his old medication left, report he spend the night with her yesterday, and this morning she found him in the morning on his knees, with some drooling, confused and altered for which she called EMS, patient more alert while in ED, and he did say " I did talk pills, I have overdose" (he repeated this to me as well), he is unable to answer any further questions about suicidal thoughts or ideations as he remains significantly altered. - in ED patient is altered, MRI with no acute findings, within normal limit, lactic acid initially elevated at 2 but improved to 1.6 with IV fluids, leukocytosis at 14.4, urine drug screen is negative, [facility] hospitalist consulted to admit.    Review of systems:    Patient unable to provide appropriate review of systems given his  significant encephalopathy.   With Past History of the following :    Past Medical History:  Diagnosis Date  . Bipolar disorder (HCC)   . Bronchitis   . Hyperlipidemia   . Paranoid schizophrenia (HCC)   . Schizophrenia [facility]       Past Surgical History:  Procedure Laterality Date  . TESTICLE IMPLANTATION TO THIGH    . TESTICLE SURGERY        Social History:     Social History   Tobacco Use  . Smoking status: Current Every Day Smoker    Packs/day: 1.00    Types: Cigarettes  . Smokeless tobacco: Never Used  Substance Use Topics  . Alcohol use: Not Currently    Comment: weekly      Family History :     Family History  Problem Relation Age of Onset  . Schizophrenia Neg Hx      Home Medications:   Prior to Admission medications   Medication Sig Start Date End Date  Taking? Authorizing Provider  benztropine (COGENTIN) 1 MG tablet Take 1 tablet (1 mg total) by mouth 2 (two) times daily. 02/08/20 02/07/21  Aldean BakerSykes, Janet E, NP  hydrOXYzine (ATARAX/VISTARIL) 25 MG tablet Take 1 tablet (25 mg total) by mouth 3 (three) times daily as needed for anxiety. 02/08/20   Aldean BakerSykes, Janet E, NP  ibuprofen (ADVIL) 800 MG tablet Take 800 mg by mouth 2 (two) times daily as needed for pain. 02/23/20   [provider]  Oxcarbazepine (TRILEPTAL) 300 MG tablet Take 1 tablet (300 mg total) by mouth 2 (two) times daily. 02/08/20   Aldean BakerSykes, Janet E, NP  paliperidone (INVEGA SUSTENNA) 234 MG/1.5ML SUSY injection Inject 234 mg into the muscle once.    [provider]  paliperidone (INVEGA) 9 MG 24 hr tablet Take 1 tablet (9 mg total) by mouth at bedtime. 02/08/20   Aldean BakerSykes, Janet E, NP  traZODone (DESYREL) 100 MG tablet Take 1 tablet (100 mg total) by mouth at bedtime as needed for sleep. 02/08/20   Aldean BakerSykes, Janet E, NP     Allergies:     Allergies  Allergen Reactions  . Other     Some trial medicine from [facility].      Physical Exam:   Vitals  Blood pressure (!) 141/84,  pulse 85, temperature 98.1 F (36.7 C), temperature source Axillary, resp. rate 12, height 6\' 1"  (1.854 m), weight 123 kg, SpO2 99 %.   1. General well developed male, laying in bed, lethargic in no apparent distress  2.  Lethargic, confused, some minimal interactions but otherwise unable to answer questions appropriately .  3. No F.N deficits, ALL C.Nerves Intact, Strength 5/5 all 4 extremities, Sensation intact all 4 extremities, Plantars down going.  4. Ears and Eyes appear Normal, Conjunctivae clear, PERRLA. Moist Oral Mucosa.  5. Supple Neck, No JVD, No cervical lymphadenopathy appriciated, No Carotid Bruits.  6. Symmetrical Chest wall movement, Good air movement bilaterally, CTAB.  7. RRR, No Gallops, Rubs or Murmurs, No Parasternal Heave.  8. Positive Bowel Sounds, Abdomen Soft, No tenderness, No organomegaly appriciated,No rebound -guarding or rigidity.  9.  No Cyanosis, Normal Skin Turgor, No Skin Rash or Bruise.  10. Good muscle tone,  joints appear normal , no effusions, Normal ROM.  11. No Palpable Lymph Nodes in Neck or Axillae    Data Review:    CBC Recent Labs  Lab 02/23/20 0936  WBC 14.4*  HGB 13.7  HCT 39.3  PLT 286  MCV 87.1  MCH 30.4  MCHC 34.9  RDW 12.1  LYMPHSABS 0.8  MONOABS 0.6  EOSABS 0.0  BASOSABS 0.0   ------------------------------------------------------------------------------------------------------------------  Chemistries  Recent Labs  Lab 02/23/20 0936  NA 134*  K 3.9  CL 102  CO2 25  GLUCOSE 148*  BUN 18  CREATININE 1.02  CALCIUM 8.7*  AST 31  ALT 31  ALKPHOS 61  BILITOT 0.4   ------------------------------------------------------------------------------------------------------------------ estimated creatinine clearance is 125.6 mL/min (by C-G formula based on SCr of 1.02 mg/dL). ------------------------------------------------------------------------------------------------------------------ No results for  input(s): TSH, T4TOTAL, T3FREE, THYROIDAB in the last 72 hours.  Invalid input(s): FREET3  Coagulation profile Recent Labs  Lab 02/23/20 0936  INR 1.0   ------------------------------------------------------------------------------------------------------------------- No results for input(s): DDIMER in the last 72 hours. -------------------------------------------------------------------------------------------------------------------  Cardiac Enzymes No results for input(s): CKMB, TROPONINI, MYOGLOBIN in the last 168 hours.  Invalid input(s): CK ------------------------------------------------------------------------------------------------------------------ No results found for: BNP   ---------------------------------------------------------------------------------------------------------------  Urinalysis    Component Value Date/Time   COLORURINE YELLOW  02/23/2020 0931   APPEARANCEUR CLEAR 02/23/2020 0931   LABSPEC 1.019 02/23/2020 0931   PHURINE 5.0 02/23/2020 0931   GLUCOSEU NEGATIVE 02/23/2020 0931   HGBUR NEGATIVE 02/23/2020 0931   BILIRUBINUR NEGATIVE 02/23/2020 0931   KETONESUR NEGATIVE 02/23/2020 0931   PROTEINUR NEGATIVE 02/23/2020 0931   NITRITE NEGATIVE 02/23/2020 0931   LEUKOCYTESUR NEGATIVE 02/23/2020 0931    ----------------------------------------------------------------------------------------------------------------   Imaging Results:    CT Head Wo Contrast  Result Date: 02/23/2020 CLINICAL DATA:  Delirium with unresponsiveness EXAM: CT HEAD WITHOUT CONTRAST TECHNIQUE: Contiguous axial images were obtained from the base of the skull through the vertex without intravenous contrast. COMPARISON:  None. FINDINGS: Brain: Ventricles and sulci are normal in size and configuration. There is no intracranial mass, hemorrhage, extra-axial fluid collection, midline shift. A focal area of decreased attenuation in the medial upper pons-lower midbrain junction is  noted which potentially may represent a small recent infarct. Elsewhere brain parenchyma appears unremarkable. Vascular: No hyperdense vessel. No appreciable vascular calcification. Skull: The bony calvarium appears intact. Sinuses/Orbits: There is mucosal thickening in several ethmoid air cells. Orbits appear symmetric bilaterally. Other: Mastoid air cells are clear. IMPRESSION: Questionable small infarct, age uncertain, in the medial right pons-midbrain junction. Elsewhere brain parenchyma appears unremarkable. No mass or hemorrhage. Foci of mucosal thickening in several ethmoid air cells noted. Electronically Signed   By: Bretta Bang III M.D.   On: 02/23/2020 11:11   MR BRAIN WO CONTRAST  Result Date: 02/23/2020 CLINICAL DATA:  Schizophrenic.  Found unresponsive. EXAM: MRI HEAD WITHOUT CONTRAST TECHNIQUE: Multiplanar, multiecho pulse sequences of the brain and surrounding structures were obtained without intravenous contrast. COMPARISON:  CT head from the same day. FINDINGS: Brain: No acute infarction, hemorrhage, hydrocephalus, extra-axial collection or mass lesion. Vascular: Major arterial flow voids are maintained at the skull base. Skull and upper cervical spine: Normal marrow signal. Sinuses/Orbits: Mild ethmoid air cell mucosal thickening. Otherwise, sinuses are clear. Unremarkable orbits. Other: No mastoid effusions. IMPRESSION: No evidence of acute intracranial abnormality. Specifically, no acute infarct. Electronically Signed   By: Feliberto Harts MD   On: 02/23/2020 12:52   DG Chest Port 1 View  Result Date: 02/23/2020 CLINICAL DATA:  Questionable sepsis.  Evaluate for abnormality. EXAM: PORTABLE CHEST 1 VIEW COMPARISON:  Prior in and earlier. FINDINGS: Shallow inspiration radiograph. Heart size within normal limits. Mild ill-defined opacity within the medial right lung base. The lungs are otherwise clear. No evidence of pleural effusion or pneumothorax. No acute bony abnormality  identified. IMPRESSION: Shallow inspiration radiograph. Mild ill-defined opacity within the right lung base, which may reflect atelectasis. A right basilar pneumonia is difficult to exclude. Consider a repeat radiograph with improved inspiration for further characterization. Electronically Signed   By: Jackey Loge DO   On: 02/23/2020 10:06    My personal review of EKG: Rhythm NSR, Rate  93 /min, QTc 435 , no Acute ST changes   Assessment & Plan:    Active Problems:   Tobacco use disorder   Paranoid schizophrenia (HCC)   Overdose, undetermined intent, initial encounter   Overdose  Intentional overdose -She endorses he took 60 medication, but unfortunately we cannot confirm which medications he took, but per mother he did not dispense his new psychotic regimen, and she is unclear if he has any leftover from his previous psychotic regimen. -He will be admitted for close monitoring especially with his encephalopathy, will monitor him on telemetry, will obtain daily labs, will monitor potassium and magnesium level closely, will  keep potassium> 4, magnesium> 2. -Patient is IVC did, he will be on 1-1 suicide precaution. -Will place psych consult when patient is medically clear.  Acute toxic encephalopathy -MRI brain with no acute finding, ammonia within normal limit, this is due to above, -Drug screen, alcohol level within normal limit.  Paranoid schizophrenia/bipolar disorder -To be addressed by psychiatry once patient is medically clear and consult is placed  Intentional overdose DVT Prophylaxis  Lovenox   AM Labs Ordered, also please review Full Orders  Family Communication: Admission, patients condition and plan of care including tests being ordered have been discussed with (mother by phone) who indicate understanding and agree with the plan and Code Status.  Code Status full  Likely DC to likely will need psych placement,  Condition GUARDED    Consults called: none yet, but he  will need psych consult when encephalopathy has resolved and medically cleared  Admission status: inpatient  Time spent in minutes : 55 minutes   Huey Bienenstock M.D on 02/23/2020 at 4:43 PM   [facility] - Office  [phone]

## 2020-02-23 NOTE — ED Notes (Signed)
Father at bedside. Patient continues to rest with eyes closed, slow to respond. Warming blanket removed.

## 2020-02-23 NOTE — ED Notes (Signed)
Patient transported to MRI 

## 2020-02-23 NOTE — ED Notes (Signed)
Patient eating and drinking at this time

## 2020-02-23 NOTE — ED Provider Notes (Addendum)
[facility] EMERGENCY DEPARTMENT Provider Note   CSN: [id number] Arrival date & time: 02/23/20  0857    History Chief Complaint  Patient presents with  . Altered Mental Status    [name] is a 44 y.o. male with past medical history significant for paranoid schizophrenia, bipolar disorder who presents for evaluation of altered mental status.  Per EMS patient stayed with out last night.  Family report that he has not been taking his medications.  Aunt found him unresponsive with emesis on him. Patient has been sleeping in glass sunroom all night per family. When EMS arrived patient was mumbling, able to stand and pivot to stretcher.  Blood sugar 239.  On arrival to the [facility] patient unresponsive to painful stimuli however protecting his airway.  Patient did mumble to nursing that he took "pills" however was unable to tell nursing what pills were taking prior to falling back to sleep.  Level 5 caveat-altered mental status  HPI     Past Medical History:  Diagnosis Date  . Bipolar disorder ([place])   . Bronchitis   . Hyperlipidemia   . Paranoid schizophrenia ([place])   . Schizophrenia [facility])     Patient Active Problem List   Diagnosis Date Noted  . Overdose, undetermined intent, initial encounter 02/23/2020  . Overdose 02/23/2020  . Paranoid schizophrenia ([place]) 02/02/2020  . Schizophrenia, paranoid ([place]) 05/31/2019  . Tobacco use disorder   . Schizoaffective disorder, bipolar type ([place]) 06/29/2014    Past Surgical History:  Procedure Laterality Date  . TESTICLE IMPLANTATION TO THIGH    . TESTICLE SURGERY         Family History  Problem Relation Age of Onset  . Schizophrenia Neg Hx     Social History   Tobacco Use  . Smoking status: Current Every Day Smoker    Packs/day: 1.00    Types: Cigarettes  . Smokeless tobacco: Never Used  Vaping Use  . Vaping Use: Never used  Substance Use Topics  . Alcohol use: Not Currently    Comment: weekly  . Drug use: Not  Currently    Types: Marijuana    Home Medications Prior to Admission medications   Medication Sig Start Date End Date Taking? Authorizing Provider  benztropine (COGENTIN) 1 MG tablet Take 1 tablet (1 mg total) by mouth 2 (two) times daily. 02/08/20 02/07/21  Connye Burkitt, NP  hydrOXYzine (ATARAX/VISTARIL) 25 MG tablet Take 1 tablet (25 mg total) by mouth 3 (three) times daily as needed for anxiety. 02/08/20   Connye Burkitt, NP  ibuprofen (ADVIL) 800 MG tablet Take 800 mg by mouth 2 (two) times daily as needed for pain. 02/23/20   [provider]  Oxcarbazepine (TRILEPTAL) 300 MG tablet Take 1 tablet (300 mg total) by mouth 2 (two) times daily. 02/08/20   Connye Burkitt, NP  paliperidone (INVEGA SUSTENNA) 234 MG/1.5ML SUSY injection Inject 234 mg into the muscle once.    [provider]  paliperidone (INVEGA) 9 MG 24 hr tablet Take 1 tablet (9 mg total) by mouth at bedtime. 02/08/20   Connye Burkitt, NP  traZODone (DESYREL) 100 MG tablet Take 1 tablet (100 mg total) by mouth at bedtime as needed for sleep. 02/08/20   Connye Burkitt, NP    Allergies    Other  Review of Systems   Review of Systems  Unable to perform ROS: Mental status change  All other systems reviewed and are negative.  Physical Exam Updated  Vital Signs BP 125/83   Pulse 70   Temp 98.1 F (36.7 C) (Axillary)   Resp 12   Ht '6\' 1"'  (1.854 m)   Wt 123 kg   SpO2 98%   BMI 35.78 kg/m   Physical Exam Constitutional:      Appearance: He is obese. He is not ill-appearing, toxic-appearing or diaphoretic.  HENT:     Head: Normocephalic and atraumatic.     Nose: Nose normal.     Mouth/Throat:     Mouth: Mucous membranes are dry.  Eyes:     Pupils: Pupils are equal, round, and reactive to light.  Neck:     Comments: Freely moves neck Cardiovascular:     Rate and Rhythm: Normal rate.     Pulses: Normal pulses.          Radial pulses are 2+ on the right side and 2+ on the left side.        Dorsalis pedis pulses are 2+ on the right side and 2+ on the left side.       Posterior tibial pulses are 2+ on the right side and 2+ on the left side.     Heart sounds: Normal heart sounds.  Pulmonary:     Effort: Pulmonary effort is normal.     Breath sounds: Normal breath sounds and air entry.     Comments: Clear to auscultation bilaterally Abdominal:     General: Bowel sounds are normal. There is no distension.     Palpations: Abdomen is soft.     Tenderness: There is no abdominal tenderness. There is no right CVA tenderness, left CVA tenderness, guarding or rebound.     Hernia: No hernia is present.     Comments: Soft, nontender.  Normoactive bowel sounds  Musculoskeletal:     Cervical back: Full passive range of motion without pain and normal range of motion.  Feet:     Right foot:     Skin integrity: Skin integrity normal.     Left foot:     Skin integrity: Skin integrity normal.  Skin:    General: Skin is warm.     Capillary Refill: Capillary refill takes less than 2 seconds.     Comments: No rashes or lesions  Neurological:     Mental Status: He is unresponsive.     Comments: Intermittently responsive with nursing. Unable to follow commands. No obvious facial droop. Does not withdraw to pain.    ED Results / Procedures / Treatments   Labs (all labs ordered are listed, but only abnormal results are displayed) Labs Reviewed  LACTIC ACID, PLASMA - Abnormal; Notable for the following components:      Result Value   Lactic Acid, Venous 2.0 (*)    All other components within normal limits  COMPREHENSIVE METABOLIC PANEL - Abnormal; Notable for the following components:   Sodium 134 (*)    Glucose, Bld 148 (*)    Calcium 8.7 (*)    All other components within normal limits  CBC WITH DIFFERENTIAL/PLATELET - Abnormal; Notable for the following components:   WBC 14.4 (*)    Neutro Abs 12.9 (*)    All other components within normal limits  APTT - Abnormal; Notable for the  following components:   aPTT 21 (*)    All other components within normal limits  ACETAMINOPHEN LEVEL - Abnormal; Notable for the following components:   Acetaminophen (Tylenol), Serum <10 (*)    All other components within normal  limits  SALICYLATE LEVEL - Abnormal; Notable for the following components:   Salicylate Lvl <7.1 (*)    All other components within normal limits  CBG MONITORING, ED - Abnormal; Notable for the following components:   Glucose-Capillary 137 (*)    All other components within normal limits  CULTURE, BLOOD (SINGLE)  RESPIRATORY PANEL BY RT PCR (FLU A&B, COVID)  URINE CULTURE  LACTIC ACID, PLASMA  PROTIME-INR  URINALYSIS, ROUTINE W REFLEX MICROSCOPIC  RAPID URINE DRUG SCREEN, HOSP PERFORMED  ETHANOL  AMMONIA  10-HYDROXYCARBAZEPINE  MAGNESIUM  MAGNESIUM    EKG EKG Interpretation  Date/Time:  Wednesday February 23 2020 09:14:38 EDT Ventricular Rate:  93 PR Interval:    QRS Duration: 100 QT Interval:  349 QTC Calculation: 435 R Axis:   62 Text Interpretation: Sinus rhythm Prominent P waves, nondiagnostic No significant change since last tracing Confirmed by Fredia Sorrow [phone]) on 02/23/2020 9:43:47 AM   Radiology CT Head Wo Contrast  Result Date: 02/23/2020 CLINICAL DATA:  Delirium with unresponsiveness EXAM: CT HEAD WITHOUT CONTRAST TECHNIQUE: Contiguous axial images were obtained from the base of the skull through the vertex without intravenous contrast. COMPARISON:  None. FINDINGS: Brain: Ventricles and sulci are normal in size and configuration. There is no intracranial mass, hemorrhage, extra-axial fluid collection, midline shift. A focal area of decreased attenuation in the medial upper pons-lower midbrain junction is noted which potentially may represent a small recent infarct. Elsewhere brain parenchyma appears unremarkable. Vascular: No hyperdense vessel. No appreciable vascular calcification. Skull: The bony calvarium appears intact.  Sinuses/Orbits: There is mucosal thickening in several ethmoid air cells. Orbits appear symmetric bilaterally. Other: Mastoid air cells are clear. IMPRESSION: Questionable small infarct, age uncertain, in the medial right pons-midbrain junction. Elsewhere brain parenchyma appears unremarkable. No mass or hemorrhage. Foci of mucosal thickening in several ethmoid air cells noted. Electronically Signed   By: Lowella Grip III M.D.   On: 02/23/2020 11:11   MR BRAIN WO CONTRAST  Result Date: 02/23/2020 CLINICAL DATA:  Schizophrenic.  Found unresponsive. EXAM: MRI HEAD WITHOUT CONTRAST TECHNIQUE: Multiplanar, multiecho pulse sequences of the brain and surrounding structures were obtained without intravenous contrast. COMPARISON:  CT head from the same day. FINDINGS: Brain: No acute infarction, hemorrhage, hydrocephalus, extra-axial collection or mass lesion. Vascular: Major arterial flow voids are maintained at the skull base. Skull and upper cervical spine: Normal marrow signal. Sinuses/Orbits: Mild ethmoid air cell mucosal thickening. Otherwise, sinuses are clear. Unremarkable orbits. Other: No mastoid effusions. IMPRESSION: No evidence of acute intracranial abnormality. Specifically, no acute infarct. Electronically Signed   By: Margaretha Sheffield MD   On: 02/23/2020 12:52   DG Chest Port 1 View  Result Date: 02/23/2020 CLINICAL DATA:  Questionable sepsis.  Evaluate for abnormality. EXAM: PORTABLE CHEST 1 VIEW COMPARISON:  Prior in and earlier. FINDINGS: Shallow inspiration radiograph. Heart size within normal limits. Mild ill-defined opacity within the medial right lung base. The lungs are otherwise clear. No evidence of pleural effusion or pneumothorax. No acute bony abnormality identified. IMPRESSION: Shallow inspiration radiograph. Mild ill-defined opacity within the right lung base, which may reflect atelectasis. A right basilar pneumonia is difficult to exclude. Consider a repeat radiograph with  improved inspiration for further characterization. Electronically Signed   By: Kellie Simmering DO   On: 02/23/2020 10:06    Procedures .Critical Care Performed by: Nettie Elm, PA-C Authorized by: Nettie Elm, PA-C   Critical care provider statement:    Critical care time (minutes):  45  Critical care was necessary to treat or prevent imminent or life-threatening deterioration of the following conditions:  Toxidrome (Overdose on psych meds)   Critical care was time spent personally by me on the following activities:  Discussions with consultants, evaluation of patient's response to treatment, examination of patient, ordering and performing treatments and interventions, ordering and review of laboratory studies, ordering and review of radiographic studies, pulse oximetry, re-evaluation of patient's condition, obtaining history from patient or surrogate and review of old charts   (including critical care time)  Medications Ordered in ED Medications  0.9 %  sodium chloride infusion ( Intravenous New Bag/Given 02/23/20 1704)  naloxone (NARCAN) injection 0.4 mg (0.4 mg Intravenous Given 02/23/20 0940)  sodium chloride 0.9 % bolus 1,000 mL (0 mLs Intravenous Stopped 02/23/20 1033)   ED Course  I have reviewed the triage vital signs and the nursing notes.  Pertinent labs & imaging results that were available during my care of the patient were reviewed by me and considered in my medical decision making (see chart for details).  44 year old with extensive psychiatric history presents for evaluation of altered mental status.  He is hypothermic on arrival without any tachycardia, tachypnea or hypoxia.  Intermittently responsive for nursing however patient does not withdrawal to pain from myself.  Pupils equal and reactive to light.  Heart and lungs clear.  Abdomen soft.  No evidence of acute traumatic injury.  No obvious facial droop.  Does not follow commands.  Apparently had told nursing  that he took some pills however fell back asleep prior to stating exactly what these were. Currently protecting his airway.  Patient is hypothermic family did tell EMS that patient slept in a glass sun room yesterday which the temperatures got down to the 40s overnight.  Patient placed on bare hugger due to hypothermia. Plan on labs, imaging and reassess.  0955: Patient reassessed. Will intermittently raise eyebrow however will not follow commands. Narcan given without response.  Labs and imaging personally reviewed and interpreted:  CBC leukocytosis at 14.4, hemoglobin 91.6 Metabolic panel mild hyponatremia 134, hyperglycemia 148 over no additional electrolyte, renal or normality INR 1.0 UA negative for infection, culture sent UDS negative Lactic acid 2.0 BC pending DG chest with possible atelectasis versus infiltrate in right lung base.  Recommend repeat chest x-ray with better inspiration. CT head with possible infarct,w ill obtain MR brain EKG without ST changes  1100: Patient reassessed. More awake, Will moan to questions however will not open eyes, Quickly falls back to sleep  1300: Patient reassessed. Father at bedside. Patient more arousable states he took his home psych meds in slurred speech. Will not states whether intention OD however family states he has been off his home psych meds and has been paranoid.  Temperature improved after bear hugger placed.  1430: Patient still continues to be very sleepy. Unable to hold conversation. Intermittently answers questions however to understand answers. Does not follow commands.  Patient's sleepiness likely due to overdose which patient admitted to to myself and nursing on arrival.  Patient's vital signs, exam not consistent with serotonin syndrome, neck stiffness or neck rigidity.  Have low suspicion for meningitis.  His UDS was negative for illicit substances. Narcan given without affect on patients mentation. Equal pulses bilaterally.   Tactile temperature to extremities.  Stable vital signs.  Low suspicion for shock, cardiopulmonary etiology, intra-abdominal process. Does not appear septic at this time.  Patient seen and evaluated by attending, Dr. Rogene Houston who agrees above treatment, plan  and disposition.  CONSULT with Dr. Waldron Labs with [facility] who agrees to evaluate patient for admission.  Patient to be admitted for continued somnolence, altered mental status.  Patient placed under IVC due to concerning story by family, patient not taking his medications, complaining about auditory hallucinations with admitted overdose, unsure of intent.   MDM Rules/Calculators/A&P                           Final Clinical Impression(s) / ED Diagnoses Final diagnoses:  Altered mental status, unspecified altered mental status type  Drug overdose, undetermined intent, initial encounter    Rx / DC Orders ED Discharge Orders    None       Kaisei Gilbo A, PA-C 02/23/20 1707    Roger Kettles A, PA-C 02/23/20 1709    Fredia Sorrow, MD 02/25/20 2202

## 2020-02-23 NOTE — ED Notes (Signed)
Pt woke up and answered when asked if he did any pills. Pt states he overdosed. Unable to tell us

## 2020-02-23 NOTE — ED Notes (Signed)
Patient more responsive upon arrival to room from CT. Speech is slurred, trying to verbalize what medication he took. Unable to understand. VSS.

## 2020-02-23 NOTE — ED Notes (Signed)
Patient transported to CT 

## 2020-02-23 NOTE — ED Notes (Signed)
Date and time results received: 02/23/20 1005 (use smartphrase ".now" to insert current time)  Test: Lactic Acid Critical Value: 2.0  Name of Provider Notified: Ralph Leyden, PA  Orders Received? Or Actions Taken?: Yes

## 2020-02-23 NOTE — Plan of Care (Signed)

## 2020-02-23 NOTE — ED Triage Notes (Signed)
Pt is schizophrenic. Stayed with aunt last night and reported by family that he has not been taking his meds. Aunt found him with vomit and unresponsive. Pt was drooling when EMS arrived and mumbled, able to stand and pivot to stretcher. Bs 239. Pt currently snoring. No response to painful stimuli

## 2020-02-23 NOTE — ED Notes (Signed)
IVC papers faxed to Magistrate 

## 2020-02-23 NOTE — ED Notes (Signed)
Father at bedside.

## 2020-02-24 ENCOUNTER — Other Ambulatory Visit: Payer: Self-pay | Admitting: Behavioral Health

## 2020-02-24 ENCOUNTER — Encounter (HOSPITAL_COMMUNITY): Payer: Self-pay | Admitting: Behavioral Health

## 2020-02-24 ENCOUNTER — Inpatient Hospital Stay (HOSPITAL_COMMUNITY)
Admission: AD | Admit: 2020-02-24 | Discharge: 2020-03-06 | DRG: 885 | Disposition: A | Discharge status: Home / Self Care | Payer: Medicare HMO | Source: Intra-hospital | Attending: Psychiatry | Admitting: Psychiatry

## 2020-02-24 DIAGNOSIS — F209 Schizophrenia, unspecified: Secondary | ICD-10-CM | POA: Diagnosis present

## 2020-02-24 DIAGNOSIS — Z6836 Body mass index (BMI) 36.0-36.9, adult: Secondary | ICD-10-CM | POA: Diagnosis not present

## 2020-02-24 DIAGNOSIS — T4391XA Poisoning by unspecified psychotropic drug, accidental (unintentional), initial encounter: Secondary | ICD-10-CM | POA: Diagnosis present

## 2020-02-24 DIAGNOSIS — F17200 Nicotine dependence, unspecified, uncomplicated: Secondary | ICD-10-CM | POA: Diagnosis not present

## 2020-02-24 DIAGNOSIS — F200 Paranoid schizophrenia: Principal | ICD-10-CM | POA: Diagnosis present

## 2020-02-24 DIAGNOSIS — E669 Obesity, unspecified: Secondary | ICD-10-CM | POA: Diagnosis not present

## 2020-02-24 DIAGNOSIS — F1011 Alcohol abuse, in remission: Secondary | ICD-10-CM | POA: Diagnosis present

## 2020-02-24 DIAGNOSIS — T50901S Poisoning by unspecified drugs, medicaments and biological substances, accidental (unintentional), sequela: Secondary | ICD-10-CM | POA: Diagnosis not present

## 2020-02-24 DIAGNOSIS — R4182 Altered mental status, unspecified: Secondary | ICD-10-CM | POA: Diagnosis not present

## 2020-02-24 DIAGNOSIS — G4700 Insomnia, unspecified: Secondary | ICD-10-CM | POA: Diagnosis present

## 2020-02-24 DIAGNOSIS — T50902A Poisoning by unspecified drugs, medicaments and biological substances, intentional self-harm, initial encounter: Secondary | ICD-10-CM | POA: Diagnosis present

## 2020-02-24 DIAGNOSIS — Z9114 Patient's other noncompliance with medication regimen: Secondary | ICD-10-CM

## 2020-02-24 DIAGNOSIS — Z20822 Contact with and (suspected) exposure to covid-19: Secondary | ICD-10-CM | POA: Diagnosis not present

## 2020-02-24 DIAGNOSIS — Z79899 Other long term (current) drug therapy: Secondary | ICD-10-CM | POA: Diagnosis not present

## 2020-02-24 DIAGNOSIS — E785 Hyperlipidemia, unspecified: Secondary | ICD-10-CM | POA: Diagnosis present

## 2020-02-24 DIAGNOSIS — F17210 Nicotine dependence, cigarettes, uncomplicated: Secondary | ICD-10-CM | POA: Diagnosis not present

## 2020-02-24 DIAGNOSIS — G249 Dystonia, unspecified: Secondary | ICD-10-CM | POA: Diagnosis present

## 2020-02-24 DIAGNOSIS — T50904A Poisoning by unspecified drugs, medicaments and biological substances, undetermined, initial encounter: Secondary | ICD-10-CM | POA: Diagnosis not present

## 2020-02-24 DIAGNOSIS — T50901A Poisoning by unspecified drugs, medicaments and biological substances, accidental (unintentional), initial encounter: Secondary | ICD-10-CM | POA: Diagnosis present

## 2020-02-24 LAB — CBC
HCT: 40.3 % (ref 39.0–52.0)
Hemoglobin: 13.4 g/dL (ref 13.0–17.0)
MCH: 29.8 pg (ref 26.0–34.0)
MCHC: 33.3 g/dL (ref 30.0–36.0)
MCV: 89.8 fL (ref 80.0–100.0)
Platelets: 299 10*3/uL (ref 150–400)
RBC: 4.49 MIL/uL (ref 4.22–5.81)
RDW: 12.3 % (ref 11.5–15.5)
WBC: 10.4 10*3/uL (ref 4.0–10.5)
nRBC: 0 % (ref 0.0–0.2)

## 2020-02-24 LAB — COMPREHENSIVE METABOLIC PANEL
ALT: 28 U/L (ref 0–44)
AST: 27 U/L (ref 15–41)
Albumin: 3.9 g/dL (ref 3.5–5.0)
Alkaline Phosphatase: 60 U/L (ref 38–126)
Anion gap: 9 (ref 5–15)
BUN: 12 mg/dL (ref 6–20)
CO2: 25 mmol/L (ref 22–32)
Calcium: 8.8 mg/dL — ABNORMAL LOW (ref 8.9–10.3)
Chloride: 100 mmol/L (ref 98–111)
Creatinine, Ser: 0.85 mg/dL (ref 0.61–1.24)
GFR, Estimated: >60 mL/min (ref >60)
Glucose, Bld: 99 mg/dL (ref 70–99)
Potassium: 3.6 mmol/L (ref 3.5–5.1)
Sodium: 134 mmol/L — ABNORMAL LOW (ref 135–145)
Total Bilirubin: 0.5 mg/dL (ref 0.3–1.2)
Total Protein: 7 g/dL (ref 6.5–8.1)

## 2020-02-24 LAB — MAGNESIUM: Magnesium: 2 mg/dL (ref 1.7–2.4)

## 2020-02-24 LAB — HIV ANTIBODY (ROUTINE TESTING W REFLEX): HIV Screen 4th Generation wRfx: NONREACTIVE

## 2020-02-24 MED ORDER — TRAZODONE HCL 100 MG PO TABS: 100.0000 mg | ORAL_TABLET | Freq: Every evening | ORAL | Status: DC | PRN

## 2020-02-24 MED ORDER — TRAZODONE HCL 100 MG PO TABS
100.0000 mg | ORAL_TABLET | Freq: Every day | ORAL | Status: DC
  Filled 2020-02-24: qty 1

## 2020-02-24 MED ORDER — ALUM & MAG HYDROXIDE-SIMETH 200-200-20 MG/5ML PO SUSP
30.0000 mL | ORAL | Status: DC | PRN
  Administered 2020-02-26 – 2020-03-05 (×3): 30 mL via ORAL
  Filled 2020-02-24 (×3): qty 30

## 2020-02-24 MED ORDER — ACETAMINOPHEN 325 MG PO TABS
650.0000 mg | ORAL_TABLET | Freq: Four times a day (QID) | ORAL | Status: DC | PRN
  Administered 2020-02-25 – 2020-03-06 (×12): 650 mg via ORAL
  Filled 2020-02-24 (×13): qty 2

## 2020-02-24 MED ORDER — MAGNESIUM HYDROXIDE 400 MG/5ML PO SUSP
30.0000 mL | Freq: Every day | ORAL | Status: DC | PRN
  Administered 2020-03-03: 30 mL via ORAL

## 2020-02-24 NOTE — BH Assessment (Signed)
Tele Assessment Note   Patient Name: MAITLAND LESIAK MRN: [id number] Referring Physician: Mariea Clonts Location of Patient: [facility] Location of Provider: Behavioral Health TTS Department  MORONI NESTER is an 44 y.o. male presenting voluntarily to [facility] ED via EMS after being found unresponsive by mother with vomit on him and drooling. Patient renders limited history as he states he states he does not remember much about the events that led him to the hospital. Patient states "I took too many pills. It was paliperidone. I wasn't trying to hurt myself. I was trying to heal myself from the pain of losing loved ones." Patient denies current SI/HI/AVH. Per chart review patient was discharged from [facility] on 10/12 after spending 1 week there.  He was hospitalized due to AVH and paranoid delusions. Patient continues to present somewhat bizarre and is disorganized. He expressed paranoia about the lack of communication between his parents, his mother's boyfriend, and the "rearranging" at this apartment complex where he lives. Patient denies any substance use or criminal charges. He gives verbal consent for TTS to contact his mother, [name] at [phone] for collateral information.  Per collateral: After d/c from [facility] patient changed his phone number and no one from his pharmacy- [facility]- was able to reach him. She states she took him to the pharmacy as he states he has not had any of his medications since discharge and pharmacy confirmed he did not receive his new medications. Mother believes he took an overdose of an old prescription. Last night he asked to stay at her home and she allowed him to. This morning she woke up to find patient staring out the back window with his pants down, drooling, and speaking to her incoherently. She states at that time she called 911. She states patient works with [employer] but that their contact is consistent. She believes patient needs to be hospitalized and  restarted on medications.   Diagnosis: Schizophrenia (per history)  Denzil Magnuson, PMHNP recommends in patient treatment.  Past Medical History:  Past Medical History:  Diagnosis Date   Bipolar disorder (HCC)    Bronchitis    Hyperlipidemia    Paranoid schizophrenia (HCC)    Schizophrenia (HCC)     Past Surgical History:  Procedure Laterality Date   TESTICLE IMPLANTATION TO THIGH     TESTICLE SURGERY      Family History:  Family History  Problem Relation Age of Onset   Schizophrenia Neg Hx     Social History:  reports that he has been smoking cigarettes. He has been smoking about 1.00 pack per day. He has never used smokeless tobacco. He reports previous alcohol use. He reports previous drug use. Drug: Marijuana.  Additional Social History:  Alcohol / Drug Use Pain Medications: see MAR Prescriptions: see MAR Over the Counter: see MAR History of alcohol / drug use?: No history of alcohol / drug abuse  CIWA: CIWA-Ar BP: (!) 144/89 Pulse Rate: 75 COWS:    Allergies:  Allergies  Allergen Reactions   Other     Some trial medicine from [facility].     Home Medications:  Medications Prior to Admission  Medication Sig Dispense Refill   benztropine (COGENTIN) 1 MG tablet Take 1 tablet (1 mg total) by mouth 2 (two) times daily. 60 tablet 0   hydrOXYzine (ATARAX/VISTARIL) 25 MG tablet Take 1 tablet (25 mg total) by mouth 3 (three) times daily as needed for anxiety. 30 tablet 0   ibuprofen (ADVIL) 800 MG  tablet Take 800 mg by mouth 2 (two) times daily as needed for pain.     Oxcarbazepine (TRILEPTAL) 300 MG tablet Take 1 tablet (300 mg total) by mouth 2 (two) times daily. 60 tablet 0   paliperidone (INVEGA SUSTENNA) 234 MG/1.5ML SUSY injection Inject 234 mg into the muscle once.     paliperidone (INVEGA) 9 MG 24 hr tablet Take 1 tablet (9 mg total) by mouth at bedtime. 30 tablet 0   traZODone (DESYREL) 100 MG tablet Take 1 tablet (100 mg total) by mouth at  bedtime as needed for sleep. 30 tablet 0    OB/GYN Status:  No LMP for male patient.  General Assessment Data Location of Assessment: [facility] TTS Assessment: In system Is this a Tele or Face-to-Face Assessment?: Tele Assessment Is this an Initial Assessment or a Re-assessment for this encounter?: Initial Assessment Patient Accompanied by:: N/A Language Other than English: No Living Arrangements:  (private residence) What gender do you identify as?: Male Date Telepsych consult ordered in [facility]: 02/24/20 Marital status: Single Pregnancy Status: No Living Arrangements: Alone Can pt return to current living arrangement?: Yes Admission Status: Involuntary Petitioner: ED Attending Is patient capable of signing voluntary admission?: No Referral Source: Self/Family/Friend Insurance type: Medicare     Crisis Care Plan Living Arrangements: Alone Legal Guardian:  (self) Name of Psychiatrist: [facility] Name of Therapist: [facility]  Education Status Is patient currently in school?: No Is the patient employed, unemployed or receiving disability?: Receiving disability income  Risk to self with the past 6 months Suicidal Ideation: No Suicidal Intent: No Has patient had any suicidal intent within the past 6 months prior to admission? : No Is patient at risk for suicide?: Yes Suicidal Plan?: No-Not Currently/Within Last 6 Months Has patient had any suicidal plan within the past 6 months prior to admission? : No Access to Means: Yes Specify Access to Suicidal Means: access to prescription medications What has been your use of drugs/alcohol within the last 12 months?: denies Previous Attempts/Gestures: No How many times?: 0 Other Self Harm Risks: denies Triggers for Past Attempts: Unknown Intentional Self Injurious Behavior: None Family Suicide History: No Recent stressful life event(s): Other (Comment) (recent hospitalization) Persecutory voices/beliefs?: No Depression:  Yes Depression Symptoms: Insomnia, Tearfulness, Feeling worthless/self pity Substance abuse history and/or treatment for substance abuse?: No Suicide prevention information given to non-admitted patients: Not applicable  Risk to Others within the past 6 months Homicidal Ideation: No Does patient have any lifetime risk of violence toward others beyond the six months prior to admission? : No Thoughts of Harm to Others: No Current Homicidal Intent: No Current Homicidal Plan: No Access to Homicidal Means: No Identified Victim: denies History of harm to others?: No Assessment of Violence: None Noted Violent Behavior Description: none noted Does patient have access to weapons?: No Criminal Charges Pending?: No Does patient have a court date: No Is patient on probation?: No  Psychosis Hallucinations: Auditory, Visual Delusions: Unspecified  Mental Status Report Appearance/Hygiene: Unremarkable Eye Contact: Good Motor Activity: Freedom of movement Speech: Logical/coherent Level of Consciousness: Alert Mood: Pleasant Affect: Appropriate to circumstance Anxiety Level: Minimal Thought Processes: Flight of Ideas Judgement: Impaired Orientation: Person, Place, Time Obsessive Compulsive Thoughts/Behaviors: None  Cognitive Functioning Concentration: Normal Memory: Remote Intact, Recent Impaired Is patient IDD: No Insight: Poor Impulse Control: Poor Appetite: Good Have you had any weight changes? : No Change Sleep: No Change Total Hours of Sleep: 8 Vegetative Symptoms: None  ADLScreening [facility] Assessment Services) Patient's  cognitive ability adequate to safely complete daily activities?: Yes Patient able to express need for assistance with ADLs?: Yes Independently performs ADLs?: Yes (appropriate for developmental age)  Prior Inpatient Therapy Prior Inpatient Therapy: Yes Prior Therapy Dates: multiple in 2021, prior years as well Prior Therapy Facilty/Provider(s):  [facility] Reason for Treatment: schizophrenia  Prior Outpatient Therapy Prior Outpatient Therapy: Yes Prior Therapy Dates: ongoing Prior Therapy Facilty/Provider(s): [facility] Reason for Treatment: schizophrenia Does patient have an ACCT team?: Yes Does patient have Intensive In-House Services?  : No Does patient have [facility] services? : No Does patient have P4CC services?: No  ADL Screening (condition at time of admission) Patient's cognitive ability adequate to safely complete daily activities?: Yes Is the patient deaf or have difficulty hearing?: No Does the patient have difficulty seeing, even when wearing glasses/contacts?: No Does the patient have difficulty concentrating, remembering, or making decisions?: No Patient able to express need for assistance with ADLs?: Yes Does the patient have difficulty dressing or bathing?: No Independently performs ADLs?: Yes (appropriate for developmental age) Does the patient have difficulty walking or climbing stairs?: No Weakness of Legs: Both Weakness of Arms/Hands: None  Home Assistive Devices/Equipment Home Assistive Devices/Equipment: None  Therapy Consults (therapy consults require a physician order) PT Evaluation Needed: No OT Evalulation Needed: No SLP Evaluation Needed: No Abuse/Neglect Assessment (Assessment to be complete while patient is alone) Abuse/Neglect Assessment Can Be Completed: Yes Physical Abuse: Denies Verbal Abuse: Denies Sexual Abuse: Denies Exploitation of patient/patient's resources: Denies Self-Neglect: Denies Values / Beliefs Cultural Requests During Hospitalization: None Spiritual Requests During Hospitalization: None Consults Spiritual Care Consult Needed: No Transition of Care Team Consult Needed: No Advance Directives (For Healthcare) Does Patient Have a Medical Advance Directive?: No Would patient like information on creating a medical advance directive?: No - Patient  declined Nutrition Screen- [facility] Adult/[facility] Patient's home diet: Regular Has the patient recently lost weight without trying?: No Has the patient been eating poorly because of a decreased appetite?: No Malnutrition Screening Tool Score: 0        Disposition: Denzil Magnuson, PMHNP recommends in patient treatment. Disposition Initial Assessment Completed for this Encounter: Yes  This service was provided via telemedicine using a 2-way, interactive audio and video technology.  Names of all persons participating in this telemedicine service and their role in this encounter. Name: Adrick Kestler Role: patient  Name: Celedonio Miyamoto, LCSW Role: TTS  Name:  Role:   Name:  Role:     Celedonio Miyamoto 02/24/2020 10:47 AM

## 2020-02-24 NOTE — BHH Counselor (Signed)
Per Everardo Pacific RN/AC- patient accepted to [facility] 500 hall Patient my arrive after 1600 by [facility] department. Accepting provider is Denzil Magnuson, NP Attending is Jola Babinski, MD. Please call report to [phone].  Judeth Cornfield, RN notified of disposition.

## 2020-02-24 NOTE — TOC Transition Note (Signed)
Transition of Care [facility]) - CM/SW Discharge Note   Patient Details  Name: [name] MRN: [id number] Date of Birth: 06/07/1975  Transition of Care [facility]) CM/SW Contact:  Elliot Gault, LCSW Phone Number: 02/24/2020, 12:32 PM   Clinical Narrative:     Pt admitted from home with an intentional overdose and acute metabolic encephalopathy. Per MD, pt medically stable for dc today. TTS has evaluated patient and they will accept him in transfer today. They are requesting 4pm transport. Pt has IVC in place. Will arrange for [place] [employer] to transport pt.   Received TOC consult for medication assistance. Pt has [facility] and is not eligible for any of the assistance programs available to [facility]. It appears pt does not have difficulty affording medications. It appears behavioral health concerns are more responsible for pt not obtaining medications. Pt would benefit from [facility] team and/or pt's family members assuring pt gets new medications at dc from inpatient Behavioral Health hospital.  Expected Discharge Plan: Psychiatric Hospital Barriers to Discharge: Barriers Resolved   Patient Goals and CMS Choice        Expected Discharge Plan and Services Expected Discharge Plan: Psychiatric Hospital In-house Referral: Clinical Social Work     Living arrangements for the past 2 months: Single Family Home Expected Discharge Date: 02/24/20                                    Prior Living Arrangements/Services Living arrangements for the past 2 months: Single Family Home Lives with:: Self Patient language and need for interpreter reviewed:: Yes Do you feel safe going back to the place where you live?: Yes      Need for Family Participation in Patient Care: No (Comment) Care giver support system in place?: Yes (comment)   Criminal Activity/Legal Involvement Pertinent to Current Situation/Hospitalization: No - Comment as needed  Activities of Daily Living Home  Assistive Devices/Equipment: None ADL Screening (condition at time of admission) Patient's cognitive ability adequate to safely complete daily activities?: Yes Is the patient deaf or have difficulty hearing?: No Does the patient have difficulty seeing, even when wearing glasses/contacts?: No Does the patient have difficulty concentrating, remembering, or making decisions?: No Patient able to express need for assistance with ADLs?: Yes Does the patient have difficulty dressing or bathing?: No Independently performs ADLs?: Yes (appropriate for developmental age) Does the patient have difficulty walking or climbing stairs?: No Weakness of Legs: Both Weakness of Arms/Hands: None  Permission Sought/Granted                  Emotional Assessment   Attitude/Demeanor/Rapport: Engaged Affect (typically observed): Pleasant Orientation: : Oriented to Self, Oriented to Place, Oriented to  Time, Oriented to Situation Alcohol / Substance Use: Not Applicable Psych Involvement: Yes (comment)  Admission diagnosis:  Overdose [T50.901A] Drug overdose, undetermined intent, initial encounter [T50.904A] Altered mental status, unspecified altered mental status type [R41.82] Overdose, intentional self-harm, initial encounter [facility]) [T50.902A] Patient Active Problem List   Diagnosis Date Noted  . Overdose, intentional self-harm, initial encounter (HCC) 02/24/2020  . Overdose, undetermined intent, initial encounter 02/23/2020  . Overdose 02/23/2020  . Paranoid schizophrenia (HCC) 02/02/2020  . Schizophrenia, paranoid (HCC) 05/31/2019  . Tobacco use disorder   . Schizoaffective disorder, bipolar type (HCC) 06/29/2014   PCP:  Avon Gully, MD Pharmacy:   [facility] - [place],  - [address]  [place] [address] Phone: [phone] Fax: [phone]     Social Determinants of Health (SDOH) Interventions    Readmission Risk Interventions No flowsheet data  found.   Final next level of care: Psychiatric Hospital Barriers to Discharge: Barriers Resolved   Patient Goals and CMS Choice        Discharge Placement                       Discharge Plan and Services In-house Referral: Clinical Social Work                                   Social Determinants of Health (SDOH) Interventions     Readmission Risk Interventions No flowsheet data found.

## 2020-02-24 NOTE — Discharge Instructions (Signed)
--  Transfer to inpatient psychiatric unit for further evaluation and mental health/psychiatric stabilization

## 2020-02-24 NOTE — Progress Notes (Signed)
Adult Psychoeducational Group Note  Date:  02/24/2020 Time:  10:29 PM  Group Topic/Focus:  Wrap-Up Group:   The focus of this group is to help patients review their daily goal of treatment and discuss progress on daily workbooks.  Participation Level:  Did Not Attend  Participation Quality:  Did not attend  Affect:  Did not attend  Cognitive:  Did not attend  Insight: None  Engagement in Group:  Did not attend  Modes of Intervention:  Did not attend  Additional Comments: Brain is a new patient. He arrives in the unit after the group activity. He was oriented to the unit  Judithann Sheen 02/24/2020, 10:29 PM

## 2020-02-24 NOTE — Tx Team (Signed)
Initial Treatment Plan 02/24/2020 10:49 PM [name] HUT:[id number]    PATIENT STRESSORS: Marital or family conflict Medication change or noncompliance   PATIENT STRENGTHS: General fund of knowledge Motivation for treatment/growth   PATIENT IDENTIFIED PROBLEMS:  psychosis  "nothing"                   DISCHARGE CRITERIA:  Improved stabilization in mood, thinking, and/or behavior Verbal commitment to aftercare and medication compliance  PRELIMINARY DISCHARGE PLAN: Attend PHP/IOP Outpatient therapy  PATIENT/FAMILY INVOLVEMENT: This treatment plan has been presented to and reviewed with the patient, [name].  The patient and family have been given the opportunity to ask questions and make suggestions.  Delos Haring, RN 02/24/2020, 10:49 PM

## 2020-02-24 NOTE — Care Management CC44 (Signed)
Condition Code 44 Documentation Completed  Patient Details  Name: [name] MRN: [id number] Date of Birth: [birth year]   Condition Code 44 given:  Yes Patient signature on Condition Code 44 notice:  Yes Documentation of 2 MD's agreement:  Yes Code 44 added to claim:  Yes    Elliot Gault, LCSW 02/24/2020, 12:30 PM

## 2020-02-24 NOTE — Progress Notes (Signed)
   KREED KAUFFMAN is a 43 yo with PMhx relevant for Bipolar Disorder, paranoid schizophrenia  Who was admitted on 02/23/20 with Intentional OD-----  -- Pt is Now Medically stable and Medically cleared for discharge to inpatient Psych if bed can be found--   TTS/Behavioral health/Psych consult requested - Pt is Medically cleared and can be discharged today from a Medical standpoint--  Shon Hale, MD

## 2020-02-24 NOTE — BHH Counselor (Signed)
Per Denzil Magnuson, PMHNP this patient meets in patient care criteria. BHH reviewing for placement at this time.

## 2020-02-24 NOTE — Care Management Obs Status (Signed)
MEDICARE OBSERVATION STATUS NOTIFICATION   Patient Details  Name: [name] MRN: [id number] Date of Birth: [birth year]   Medicare Observation Status Notification Given:  Yes    Elliot Gault, LCSW 02/24/2020, 12:30 PM

## 2020-02-24 NOTE — Progress Notes (Signed)
Patient ID: [name], male   DOB: 1975/06/16, 44 y.o.   MRN: [id number]  Admission Note:  44 yr male who presents IVC in no acute distress for the treatment of SI and Depression. Pt appears flat and depressed. Pt was calm and cooperative with admission process. Pt denies SI/ HI/ AVH/ Pain at this time. Pt stated he accidentally took too much medication  Per assessment:  male presenting voluntarily to [facility] via EMS after being found unresponsive by mother with vomit on him and drooling. Patient renders limited history as he states he states he does not remember much about the events that led him to the hospital. Patient states "I took too many pills. It was paliperidone. I wasn't trying to hurt myself. I was trying to heal myself from the pain of losing loved ones." Patient denies current SI/HI/AVH. Per chart review patient was discharged from [facility] on 10/12 after spending 1 week there.  He was hospitalized due to AVH and paranoid delusions. Patient continues to present somewhat bizarre and is disorganized. He expressed paranoia about the lack of communication between his parents, his mother's boyfriend, and the "rearranging" at this apartment complex where he lives. Patient denies any substance use or criminal charges. He gives verbal consent for TTS to contact his mother, [name] at [phone] for collateral information.  Per collateral: After d/c from [facility] patient changed his phone number and no one from his pharmacy- [facility]- was able to reach him. She states she took him to the pharmacy as he states he has not had any of his medications since discharge and pharmacy confirmed he did not receive his new medications. Mother believes he took an overdose of an old prescription. Last night he asked to stay at her home and she allowed him to. This morning she woke up to find patient staring out the back window with his pants down, drooling, and speaking to her incoherently. She  states at that time she called 911. She states patient works with [employer] but that their contact is consistent. She believes patient needs to be hospitalized and restarted on medications.   A:Skin was assessed and found to be clear of any abnormal marks apart from a tattoo. PT searched and no contraband found, POC and unit policies explained and understanding verbalized. Consents obtained. Food and fluids offered, and  accepted.   R:Pt had no additional questions or concerns.

## 2020-02-24 NOTE — Progress Notes (Signed)
Patient needs to be transported to [facility] located at  [address]  Phone Number: [phone]

## 2020-02-24 NOTE — Discharge Summary (Signed)
[name], is a 44 y.o. male  DOB 11/29/1975  MRN [id number].  Admission date:  02/23/2020  Admitting Physician  Starleen Arms, MD  Discharge Date:  02/24/2020   Primary MD  Avon Gully, MD  Recommendations for primary care physician for things to follow:   --Transfer to inpatient psychiatric unit for further evaluation and mental health/psychiatric stabilization  Admission Diagnosis  Overdose [T50.901A] Drug overdose, undetermined intent, initial encounter [T50.904A] Altered mental status, unspecified altered mental status type [R41.82]   Discharge Diagnosis  Overdose [T50.901A] Drug overdose, undetermined intent, initial encounter [T50.904A] Altered mental status, unspecified altered mental status type [R41.82]    Principal Problem:   Overdose, undetermined intent, initial encounter Active Problems:   Paranoid schizophrenia (HCC)   Tobacco use disorder   Overdose   Schizoaffective disorder, bipolar type (HCC)      Past Medical History:  Diagnosis Date  . Bipolar disorder (HCC)   . Bronchitis   . Hyperlipidemia   . Paranoid schizophrenia (HCC)   . Schizophrenia [facility])     Past Surgical History:  Procedure Laterality Date  . TESTICLE IMPLANTATION TO THIGH    . TESTICLE SURGERY       HPI  from the history and physical done on the day of admission:    [name]  is a 44 y.o. male, with past medical history of paranoid schizophrenia, bipolar disorder, with admissions in the past at behavioral health due to suicidal ideations, patient is currently altered, unable to provide any reliable history, so history was obtained from ED staff and mother by phone. -Patient lives by himself, he had recent hospitalization at psychiatric facility and discharged on 10/12 for psychosis,, where his psychiatric regimen has been changed by, Wellbutrin, Benadryl, Prolixin, Topamax were stopped  and he was started on Cogentin, Trileptal, Invega and trazodone, but mother confirmed patient did not dispense his new medications yet, but as well she is unclear if he is having any of his old medication left, report he spend the night with her yesterday, and this morning she found him in the morning on his knees, with some drooling, confused and altered for which she called EMS, patient more alert while in ED, and he did say " I did talk pills, I have overdose" (he repeated this to me as well), he is unable to answer any further questions about suicidal thoughts or ideations as he remains significantly altered. - in ED patient is altered, MRI with no acute findings, within normal limit, lactic acid initially elevated at 2 but improved to 1.6 with IV fluids, leukocytosis at 14.4, urine drug screen is negative, [facility] consulted to admit.      Hospital Course:     Brief Summary:- [name] is an 44 y.o. male  with past medical history relevant for bipolar disorder and paranoid schizophrenia presenting voluntarily to [facility] via EMS after being found unresponsive by mother with vomit on him and drooling. - -- Patient states "I took too many pills. It  was paliperidone--- he apparently had episodes of emesis after ingesting the pills --- I wasn't trying to hurt myself. I was trying to heal myself from the pain of losing loved ones." Patient denies current SI/HI/AVH. Per chart review patient was discharged [facility] on [facility]/20 after spending 1 week there.  He was hospitalized due to AVH and paranoid delusions. Patient continues to present somewhat bizarre and is disorganized. He expressed paranoia about the lack of communication between his parents, his mother's boyfriend, and the "rearranging" at this apartment complex where he lives. Patient denies any substance use or criminal charges. He gives verbal consent  to contact his mother -Patient remains medically stable, EKG without  significant QT prolongation --Plan is to transfer to inpatient psychiatric care   A/p 1)Intentional overdose He endorses he took 60 pills, Patient states "I took too many pills. It was paliperidone--- he apparently had episodes of emesis after ingesting the pills , but per mother he did not dispense his new psychotic regimen, and she is unclear if he has any leftover from his previous psychotic regimen. ---Patient is IVC did, he will be on 1-1 suicide precaution. ---Transfer to inpatient psychiatric unit for further evaluation and mental health/psychiatric stabilization  2)Acute toxic encephalopathy -MRI brain with no acute finding, ammonia within normal limit, this is due to -intentional drug overdose with psychiatric medications -Drug screen, alcohol level within normal limit. --- Mentation is back to baseline -Altered Mentation encephalopathy is resolved  3)Paranoid Schizophrenia/Bipolar Disorder ---Transfer to inpatient psychiatric unit for further evaluation and mental health/psychiatric stabilization   Discharge Condition: Stable medically  Follow UP---Transfer to inpatient psychiatric unit for further evaluation and mental health/psychiatric stabilization   Consults obtained - [facility]/Psych  Diet and Activity recommendation:  As advised  Discharge Instructions    Discharge Instructions    Call MD for:  difficulty breathing, headache or visual disturbances   Complete by: As directed    Call MD for:  persistant dizziness or light-headedness   Complete by: As directed    Call MD for:  persistant nausea and vomiting   Complete by: As directed    Call MD for:  temperature >100.4   Complete by: As directed    Diet general   Complete by: As directed    Discharge instructions   Complete by: As directed    --Transfer to inpatient psychiatric unit for further evaluation and mental health/psychiatric stabilization   Increase activity slowly   Complete by: As directed          Discharge Medications     Allergies as of 02/24/2020      Reactions   Other    Some trial medicine from [facility].       Medication List    STOP taking these medications   benztropine 1 MG tablet Commonly known as: COGENTIN   hydrOXYzine 25 MG tablet Commonly known as: ATARAX/VISTARIL   ibuprofen 800 MG tablet Commonly known as: ADVIL   Oxcarbazepine 300 MG tablet Commonly known as: TRILEPTAL   paliperidone 234 MG/1.5ML Susy injection Commonly known as: INVEGA SUSTENNA   paliperidone 9 MG 24 hr tablet Commonly known as: INVEGA   traZODone 100 MG tablet Commonly known as: DESYREL       Major procedures and Radiology Reports - PLEASE review detailed and final reports for all details, in brief -   CT Head Wo Contrast  Result Date: 02/23/2020 CLINICAL DATA:  Delirium with unresponsiveness EXAM: CT HEAD WITHOUT CONTRAST TECHNIQUE: Contiguous axial images  were obtained from the base of the skull through the vertex without intravenous contrast. COMPARISON:  None. FINDINGS: Brain: Ventricles and sulci are normal in size and configuration. There is no intracranial mass, hemorrhage, extra-axial fluid collection, midline shift. A focal area of decreased attenuation in the medial upper pons-lower midbrain junction is noted which potentially may represent a small recent infarct. Elsewhere brain parenchyma appears unremarkable. Vascular: No hyperdense vessel. No appreciable vascular calcification. Skull: The bony calvarium appears intact. Sinuses/Orbits: There is mucosal thickening in several ethmoid air cells. Orbits appear symmetric bilaterally. Other: Mastoid air cells are clear. IMPRESSION: Questionable small infarct, age uncertain, in the medial right pons-midbrain junction. Elsewhere brain parenchyma appears unremarkable. No mass or hemorrhage. Foci of mucosal thickening in several ethmoid air cells noted. Electronically Signed   By: Bretta Bang III M.D.   On: 02/23/2020  11:11   MR BRAIN WO CONTRAST  Result Date: 02/23/2020 CLINICAL DATA:  Schizophrenic.  Found unresponsive. EXAM: MRI HEAD WITHOUT CONTRAST TECHNIQUE: Multiplanar, multiecho pulse sequences of the brain and surrounding structures were obtained without intravenous contrast. COMPARISON:  CT head from the same day. FINDINGS: Brain: No acute infarction, hemorrhage, hydrocephalus, extra-axial collection or mass lesion. Vascular: Major arterial flow voids are maintained at the skull base. Skull and upper cervical spine: Normal marrow signal. Sinuses/Orbits: Mild ethmoid air cell mucosal thickening. Otherwise, sinuses are clear. Unremarkable orbits. Other: No mastoid effusions. IMPRESSION: No evidence of acute intracranial abnormality. Specifically, no acute infarct. Electronically Signed   By: Feliberto Harts MD   On: 02/23/2020 12:52   DG Chest Port 1 View  Result Date: 02/23/2020 CLINICAL DATA:  Questionable sepsis.  Evaluate for abnormality. EXAM: PORTABLE CHEST 1 VIEW COMPARISON:  Prior in and earlier. FINDINGS: Shallow inspiration radiograph. Heart size within normal limits. Mild ill-defined opacity within the medial right lung base. The lungs are otherwise clear. No evidence of pleural effusion or pneumothorax. No acute bony abnormality identified. IMPRESSION: Shallow inspiration radiograph. Mild ill-defined opacity within the right lung base, which may reflect atelectasis. A right basilar pneumonia is difficult to exclude. Consider a repeat radiograph with improved inspiration for further characterization. Electronically Signed   By: Jackey Loge DO   On: 02/23/2020 10:06    Micro Results    Recent Results (from the past 240 hour(s))  Respiratory Panel by RT PCR (Flu A&B, Covid) - Nasopharyngeal Swab     Status: None   Collection Time: 02/23/20 10:08 AM   Specimen: Nasopharyngeal Swab  Result Value Ref Range Status   SARS Coronavirus 2 by RT PCR NEGATIVE NEGATIVE Final    Comment:  (NOTE) SARS-CoV-2 target nucleic acids are NOT DETECTED.  The SARS-CoV-2 RNA is generally detectable in upper respiratoy specimens during the acute phase of infection. The lowest concentration of SARS-CoV-2 viral copies this assay can detect is 131 copies/mL. A negative result does not preclude SARS-Cov-2 infection and should not be used as the sole basis for treatment or other patient management decisions. A negative result may occur with  improper specimen collection/handling, submission of specimen other than nasopharyngeal swab, presence of viral mutation(s) within the areas targeted by this assay, and inadequate number of viral copies (<131 copies/mL). A negative result must be combined with clinical observations, patient history, and epidemiological information. The expected result is Negative.  Fact Sheet for Patients:  https://www.moore.com/  Fact Sheet for Healthcare Providers:  https://www.young.biz/  This test is no t yet approved or cleared by the [place] FDA and  has been  authorized for detection and/or diagnosis of SARS-CoV-2 by FDA under an Emergency Use Authorization (EUA). This EUA will remain  in effect (meaning this test can be used) for the duration of the COVID-19 declaration under Section 564(b)(1) of the Act, 21 U.S.C. section 360bbb-3(b)(1), unless the authorization is terminated or revoked sooner.     Influenza A by PCR NEGATIVE NEGATIVE Final   Influenza B by PCR NEGATIVE NEGATIVE Final    Comment: (NOTE) The Xpert Xpress SARS-CoV-2/FLU/RSV assay is intended as an aid in  the diagnosis of influenza from Nasopharyngeal swab specimens and  should not be used as a sole basis for treatment. Nasal washings and  aspirates are unacceptable for Xpert Xpress SARS-CoV-2/FLU/RSV  testing.  Fact Sheet for Patients: https://www.moore.com/  Fact Sheet for Healthcare  Providers: https://www.young.biz/  This test is not yet approved or cleared by the [place] FDA and  has been authorized for detection and/or diagnosis of SARS-CoV-2 by  FDA under an Emergency Use Authorization (EUA). This EUA will remain  in effect (meaning this test can be used) for the duration of the  Covid-19 declaration under Section 564(b)(1) of the Act, 21  U.S.C. section 360bbb-3(b)(1), unless the authorization is  terminated or revoked. Performed at [facility], [address], [place], [place] [address]   Blood culture (routine single)     Status: None (Preliminary result)   Collection Time: 02/23/20 10:34 AM   Specimen: BLOOD  Result Value Ref Range Status   Specimen Description BLOOD RIGHT ANTECUBITAL  Final   Special Requests   Final    BOTTLES DRAWN AEROBIC AND ANAEROBIC Blood Culture adequate volume   Culture   Final    NO GROWTH < 24 HOURS Performed at [facility], [address] [place], [place] [address]    Report Status PENDING  Incomplete       Today   Subjective    [name] today has no new complaints -Eating and drinking well        No fever  Or chills   No Nausea, Vomiting or Diarrhea - ---Transfer to inpatient psychiatric unit for further evaluation and mental health/psychiatric stabilization   Patient has been seen and examined prior to discharge   Objective   Blood pressure (!) 144/89, pulse 75, temperature 97.8 F (36.6 C), temperature source Oral, resp. rate 14, height 6\' 1"  (1.854 m), weight 123 kg, SpO2 100 %.   Intake/Output Summary (Last 24 hours) at 02/24/2020 1136 Last data filed at 02/24/2020 0426 Gross per 24 hour  Intake 460 ml  Output --  Net 460 ml    Exam Gen:- Awake Alert, no acute distress  HEENT:- [place].AT, No sclera icterus Neck-Supple Neck,No JVD,.  Lungs-  CTAB , good air movement bilaterally  CV- S1, S2 normal, regular Abd-  +ve B.Sounds, Abd Soft, No tenderness,     Extremity/Skin:- No  edema,   good pulses Psych-affect is somewhat flat, oriented x3 Neuro-no new focal deficits, no tremors    Data Review   CBC w Diff:  Lab Results  Component Value Date   WBC 10.4 02/24/2020   HGB 13.4 02/24/2020   HCT 40.3 02/24/2020   PLT 299 02/24/2020   LYMPHOPCT 5 02/23/2020   MONOPCT 4 02/23/2020   EOSPCT 0 02/23/2020   BASOPCT 0 02/23/2020    CMP:  Lab Results  Component Value Date   NA 134 (L) 02/24/2020   K 3.6 02/24/2020   CL 100 02/24/2020   CO2 25 02/24/2020  BUN 12 02/24/2020   CREATININE 0.85 02/24/2020   PROT 7.0 02/24/2020   ALBUMIN 3.9 02/24/2020   BILITOT 0.5 02/24/2020   ALKPHOS 60 02/24/2020   AST 27 02/24/2020   ALT 28 02/24/2020  .   Total Discharge time is about 33 minutes  Shon Hale M.D on 02/24/2020 at 11:36 AM  Go to [email or web address] -  for contact info  [facility] - Office  [phone]

## 2020-02-25 DIAGNOSIS — F17200 Nicotine dependence, unspecified, uncomplicated: Secondary | ICD-10-CM

## 2020-02-25 DIAGNOSIS — T50901S Poisoning by unspecified drugs, medicaments and biological substances, accidental (unintentional), sequela: Secondary | ICD-10-CM

## 2020-02-25 LAB — URINE CULTURE: Culture: NO GROWTH

## 2020-02-25 LAB — 10-HYDROXYCARBAZEPINE: Triliptal/MTB(Oxcarbazepin): 52 ug/mL — ABNORMAL HIGH (ref 10–35)

## 2020-02-25 MED ORDER — LORAZEPAM 1 MG PO TABS
2.0000 mg | ORAL_TABLET | Freq: Four times a day (QID) | ORAL | Status: DC | PRN
  Administered 2020-02-25: 2 mg via ORAL
  Filled 2020-02-25: qty 2

## 2020-02-25 MED ORDER — BENZTROPINE MESYLATE 1 MG PO TABS
2.0000 mg | ORAL_TABLET | Freq: Two times a day (BID) | ORAL | Status: DC | PRN
  Administered 2020-02-25 – 2020-03-04 (×8): 2 mg via ORAL
  Filled 2020-02-25 (×8): qty 2

## 2020-02-25 MED ORDER — NICOTINE 21 MG/24HR TD PT24
21.0000 mg | MEDICATED_PATCH | Freq: Every day | TRANSDERMAL | Status: DC
  Administered 2020-02-25 – 2020-03-06 (×11): 21 mg via TRANSDERMAL
  Filled 2020-02-25 (×12): qty 1

## 2020-02-25 MED ORDER — HALOPERIDOL LACTATE 5 MG/ML IJ SOLN: 10.0000 mg | Freq: Four times a day (QID) | INTRAMUSCULAR | Status: DC | PRN

## 2020-02-25 MED ORDER — PALIPERIDONE ER 6 MG PO TB24
9.0000 mg | ORAL_TABLET | Freq: Every day | ORAL | Status: DC
  Administered 2020-02-25 – 2020-02-27 (×3): 9 mg via ORAL
  Filled 2020-02-25 (×4): qty 1

## 2020-02-25 MED ORDER — BENZTROPINE MESYLATE 1 MG/ML IJ SOLN: 2.0000 mg | Freq: Two times a day (BID) | INTRAMUSCULAR | Status: DC | PRN

## 2020-02-25 MED ORDER — LORAZEPAM 2 MG/ML IJ SOLN: 2.0000 mg | Freq: Four times a day (QID) | INTRAMUSCULAR | Status: DC | PRN

## 2020-02-25 MED ORDER — HYDROXYZINE HCL 25 MG PO TABS
25.0000 mg | ORAL_TABLET | Freq: Four times a day (QID) | ORAL | Status: DC | PRN
  Administered 2020-02-28 – 2020-03-05 (×8): 25 mg via ORAL
  Filled 2020-02-25 (×8): qty 1

## 2020-02-25 MED ORDER — HALOPERIDOL 5 MG PO TABS
10.0000 mg | ORAL_TABLET | Freq: Four times a day (QID) | ORAL | Status: DC | PRN
  Administered 2020-02-28 – 2020-02-29 (×2): 10 mg via ORAL
  Filled 2020-02-25 (×2): qty 2

## 2020-02-25 NOTE — H&P (Signed)
Psychiatric Admission Assessment Adult  Patient Identification: [name] MRN:  [id number] Date of Evaluation:  02/25/2020 Chief Complaint:  Schizophrenia (HCC) [F20.9] Principal Diagnosis: Schizophrenia (HCC) Diagnosis:  Principal Problem:   Schizophrenia (HCC) Active Problems:   Tobacco use disorder   Overdose  History of Present Illness: [name] is a 44 y.o. male with a history of schizophrenia who was admitted for medical admission following overdose on psychotropic medication.  After being medically cleared, he is admitted to behavioral health for further evaluation and medication management. From initial intake: Patient lives by himself, he had recent hospitalization at psychiatric facility and discharged on 10/12 for psychosis,, where his psychiatric regimen has been changed by, Wellbutrin, Benadryl, Prolixin, Topamax were stopped and he was started on Cogentin, Trileptal, Invega and trazodone, but mother confirmed patient did not dispense his new medications yet, but as well she is unclear if he is having any of his old medication left, report he spend the night with her yesterday, and this morning she found him in the morning on his knees, with some drooling, confused and altered for which she called EMS, patient more alert while in ED, and he did say " I did talk pills, I have overdose" (he repeated this to me as well), he is unable to answer any further questions about suicidal thoughts or ideations as he remains significantly altered. Admission from 02/02/2020 Patient is a 44 year old male with a past psychiatric history significant for schizophrenia who was placed under involuntary commitment by his mother and transported by police to the [facility] emergency room on 02/01/2020.  His mother stated that the patient had not been taking his medications.  The patient stated that his apartment was haunted, and that he was hearing voices and seeing things.  He also told the  comprehensive clinical assessment team that he had been being followed by people around [employer], and was seeing and hearing "ghost" in his apartment.  He denied any suicidal or homicidal ideation.  He stated the reason why he was in the hospital today was because he was "trying to find a job".  He was very vague about taking his medications.  He stated "I got my injection on Friday".  He was unable to recall whether or not he was taking any other medications.  He was not a good historian.  Review of the electronic medical record revealed his last psychiatric hospitalization in July of this year at [facility].  Prior to that hospitalization he was being treated with the long-acting Abilify injection, Prolixin, Topamax.  On discharge from their facility he was started on the long-acting paliperidone injection.  He received 234 mg on 12/08/2019.  The Abilify, Prolixin and Topamax were all stopped.  We are unsure whether or not the injection he received this previous Friday was paliperidone, Abilify or some other medication.  He was admitted to the hospital for evaluation and stabilization.  Today, patient is seen and examined.    Nursing notes are reviewed, and treatment team held.  Patient is pleasant and oriented, however bizarre.  He states his goal for hospital admission is to grow his hair back thick.  Patient reports that he does not understand his medication.  He is living alone. He denies suicide attempt. He denies any past self harm.  No access to guns. Patient reports that he does not remember what medications he takes, and states that after he took medicine he couldn't see anybody's face and got scared.  Patient reports depression related to his apartment needing to be fixed up.  He describes that his oven does not work, and he has had to use a microwave.  He states that he has food stamps, but mostly eats out or food from a can.  He states, "I have been hated all my life.  People are jealous of a  lifestyle that I could live".  He states that he could live a life of dating a lot of women, but admits that he has not had a date in over 10 years.  He states that he is lonely, but wants to continue living on his own.  Patient reports that he has been living in an apartment that is run down.  His rent is paid through a program with financial pathways.  He has a case Production designer, theatre/television/film through this program.  Denies drugs, alcohol use. Drinks coffee, energy drinks occassionally 1 ppd cigarettes since age 13 years. Patient's vital signs are stable with slightly elevated blood pressure, we will continue to monitor.  He reports good appetite, but poor sleep.  Nurses report only 3.25 hours of sleep last night.  He is denying any suicidal or homicidal ideation.  He is denying auditory or visual hallucinations today and does not appear to be responding to internal stimuli. Labs reviewed: CMP with mild hyponatremia and hypocalcemia; CBC, magnesium, ammonia within normal limits; Trileptal level elevated; lactic acid level elevated on admission, and normalized prior to being brought to behavioral health; HIV nonreactive  Patient gave permission to obtain collateral from mother,[name]: Family meeting by phone on 02/25/2020: Mother again states that she does not think patient ever took medication as had been instructed after last admission.  Patient and mother would like to minimize polypharmacy as well as multiday dosing so that patient can manage medication independently.  Discussed his living situation.  Mother confirms that patient's stove and refrigerator are arrested.  When she went to his apartment she found that his refrigerator was not even plugged in.  The stove does not work.  Discussed with patient the possibility of living in a group home.  Patient states that he is reluctant to do this, as he recalls a friend from high school living in a group home and he did not like that.  Patient is open to the idea of  more socialization as well as getting a new start, as he describes a poor relationship with his current neighbors and having increasing paranoia in regards to them.  Mother is encouraging patient to consider a group home.  Patient states that he would like to stay in [place] so that he can continue to be near to where his mother lives in [place].   Associated Signs/Symptoms: Depression Symptoms:  insomnia, psychomotor agitation, disturbed sleep, Duration of Depression Symptoms: No data recorded-appears to be situational (Hypo) Manic Symptoms:  Delusions, Grandiosity, Impulsivity, Labiality of Mood, Anxiety Symptoms:  Excessive Worry, Psychotic Symptoms:  Delusions, Paranoia, Duration of Psychotic Symptoms: No data recorded-stable on medication PTSD Symptoms: Negative Total Time spent with patient: 70 minutes  Past Psychiatric History: He was apparently diagnosed with schizophrenia in 1998.  He reports at least 5-6 psychiatric hospitalizations in the past, with most recent discharge on 02/08/2020 from this facility.  He has had multiple emergency department visits for psychosis.  His last psychiatric hospitalization at [facility] on 11/21/2019.  At that time he was quite psychotic.  It stated in their admission note that he was well known in the  [facility] emergency department.  He was seen there on 6/10 in which he was afraid of his apartment because someone was eating his food.  He is followed by ACTT service.  He has been previously treated with oral Abilify as well as long-acting Abilify injections, Wellbutrin, Cogentin, Prolixin, trazodone.  His discharge medications from [facility]:, Wellbutrin, Benadryl, Prolixin, Topamax were stopped and he was started on Cogentin, Trileptal, Invega and trazodone  Is the patient at risk to self? Yes.    Has the patient been a risk to self in the past 6 months? Yes.    Has the patient been a risk to self within the distant past? No.  Is the  patient a risk to others? No.  Has the patient been a risk to others in the past 6 months? No.  Has the patient been a risk to others within the distant past? No.   Prior Inpatient Therapy:  Yes  Prior Outpatient Therapy:  Yes  Alcohol Screening: 1. How often do you have a drink containing alcohol?: Never 2. How many drinks containing alcohol do you have on a typical day when you are drinking?: 1 or 2 3. How often do you have six or more drinks on one occasion?: Never AUDIT-C Score: 0 4. How often during the last year have you found that you were not able to stop drinking once you had started?: Never 5. How often during the last year have you failed to do what was normally expected from you because of drinking?: Never 6. How often during the last year have you needed a first drink in the morning to get yourself going after a heavy drinking session?: Never 7. How often during the last year have you had a feeling of guilt of remorse after drinking?: Never 8. How often during the last year have you been unable to remember what happened the night before because you had been drinking?: Never 9. Have you or someone else been injured as a result of your drinking?: No 10. Has a relative or friend or a doctor or another health worker been concerned about your drinking or suggested you cut down?: No Alcohol Use Disorder Identification Test Final Score (AUDIT): 0 Alcohol Brief Interventions/Follow-up: AUDIT Score <7 follow-up not indicated Substance Abuse History in the last 12 months:  No. Consequences of Substance Abuse: Negative Previous Psychotropic Medications: Yes  Psychological Evaluations: Yes  Past Medical History:  Past Medical History:  Diagnosis Date  . Bipolar disorder (HCC)   . Bronchitis   . Hyperlipidemia   . Paranoid schizophrenia (HCC)   . Schizophrenia [facility])     Past Surgical History:  Procedure Laterality Date  . TESTICLE IMPLANTATION TO THIGH    . TESTICLE SURGERY      Family History:  Family History  Problem Relation Age of Onset  . Schizophrenia Neg Hx    Family Psychiatric  History: Noncontributory Tobacco Screening: Have you used any form of tobacco in the last 30 days? (Cigarettes, Smokeless Tobacco, Cigars, and/or Pipes): Yes Tobacco use, Select all that apply: 5 or more cigarettes per day Are you interested in Tobacco Cessation Medications?: No, patient refused Counseled patient on smoking cessation including recognizing danger situations, developing coping skills and basic information about quitting provided: Refused/Declined practical counseling Social History:  Social History   Substance and Sexual Activity  Alcohol Use Not Currently   Comment: weekly     Social History   Substance and Sexual Activity  Drug Use Not  Currently  . Types: Marijuana    Additional Social History:               Has been living alone. Has not ACTT team            Allergies:   Allergies  Allergen Reactions  . Other     Some trial medicine from [facility].    Lab Results:  Results for orders placed or performed during the hospital encounter of 02/23/20 (from the past 48 hour(s))  Magnesium     Status: None   Collection Time: 02/24/20  6:19 AM  Result Value Ref Range   Magnesium 2.0 1.7 - 2.4 mg/dL    Comment: Performed at [facility], [address], [place], [place] [address]  CBC     Status: None   Collection Time: 02/24/20  6:19 AM  Result Value Ref Range   WBC 10.4 4.0 - 10.5 K/uL   RBC 4.49 4.22 - 5.81 MIL/uL   Hemoglobin 13.4 13.0 - 17.0 g/dL   HCT 55.7 39 - 52 %   MCV 89.8 80.0 - 100.0 fL   MCH 29.8 26.0 - 34.0 pg   MCHC 33.3 30.0 - 36.0 g/dL   RDW 32.2 02.5 - 42.7 %   Platelets 299 150 - 400 K/uL   nRBC 0.0 0.0 - 0.2 %    Comment: Performed at [facility], [address], [place], [place] [address]  Comprehensive metabolic panel     Status: Abnormal   Collection Time: 02/24/20  6:19 AM  Result Value Ref Range   Sodium 134  (L) 135 - 145 mmol/L   Potassium 3.6 3.5 - 5.1 mmol/L   Chloride 100 98 - 111 mmol/L   CO2 25 22 - 32 mmol/L   Glucose, Bld 99 70 - 99 mg/dL    Comment: Glucose reference range applies only to samples taken after fasting for at least 8 hours.   BUN 12 6 - 20 mg/dL   Creatinine, Ser 6.28 0.61 - 1.24 mg/dL   Calcium 8.8 (L) 8.9 - 10.3 mg/dL   Total Protein 7.0 6.5 - 8.1 g/dL   Albumin 3.9 3.5 - 5.0 g/dL   AST 27 15 - 41 U/L   ALT 28 0 - 44 U/L   Alkaline Phosphatase 60 38 - 126 U/L   Total Bilirubin 0.5 0.3 - 1.2 mg/dL   GFR, Estimated >31 >51 mL/min    Comment: (NOTE) Calculated using the CKD-EPI Creatinine Equation (2021)    Anion gap 9 5 - 15    Comment: Performed at [facility], [address], [place], [place] [address]  HIV Antibody (routine testing w rflx)     Status: None   Collection Time: 02/24/20  6:19 AM  Result Value Ref Range   HIV Screen 4th Generation wRfx Non Reactive Non Reactive    Comment: Performed at [facility], [address] [place], [place] [address]    Blood Alcohol level:  Lab Results  Component Value Date   [facility] <10 02/23/2020   ETH <10 02/01/2020    Metabolic Disorder Labs:  Lab Results  Component Value Date   HGBA1C 5.5 02/03/2020   MPG 111.15 02/03/2020   MPG 102.54 06/01/2019   Lab Results  Component Value Date   PROLACTIN 8.1 06/01/2019   Lab Results  Component Value Date   CHOL 172 02/03/2020   TRIG 202 (H) 02/03/2020   HDL 35 (L) 02/03/2020   CHOLHDL 4.9 02/03/2020  VLDL 40 02/03/2020   LDLCALC 97 02/03/2020   LDLCALC 138 (H) 06/01/2019    Current Medications: Current Facility-Administered Medications  Medication Dose Route Frequency Provider Last Rate Last Admin  . acetaminophen (TYLENOL) tablet 650 mg  650 mg Oral Q6H PRN Denzil Magnuson, NP   650 mg at 02/25/20 1247  . alum & mag hydroxide-simeth (MAALOX/MYLANTA) 200-200-20 MG/5ML suspension 30 mL  30 mL Oral Q4H PRN Denzil Magnuson, NP      . magnesium  hydroxide (MILK OF MAGNESIA) suspension 30 mL  30 mL Oral Daily PRN Denzil Magnuson, NP      . traZODone (DESYREL) tablet 100 mg  100 mg Oral QHS PRN Jearld Lesch, NP       PTA Medications: No medications prior to admission.    Musculoskeletal: Strength & Muscle Tone: within normal limits Gait & Station: normal Patient leans: N/A  Psychiatric Specialty Exam: Physical Exam Vitals and nursing note reviewed.  Constitutional:      Appearance: Normal appearance. He is obese.  HENT:     Head: Normocephalic and atraumatic.     Nose: Nose normal.  Eyes:     Extraocular Movements: Extraocular movements intact.  Cardiovascular:     Rate and Rhythm: Normal rate.  Pulmonary:     Effort: Pulmonary effort is normal.  Musculoskeletal:        General: Normal range of motion.     Cervical back: Normal range of motion.  Neurological:     General: No focal deficit present.     Mental Status: He is alert and oriented to person, place, and time.     Review of Systems  Constitutional: Negative.   Respiratory: Negative.   Cardiovascular: Negative.   Gastrointestinal: Negative.   Musculoskeletal: Negative.   Neurological: Negative.   Psychiatric/Behavioral: Positive for confusion, decreased concentration, dysphoric mood and sleep disturbance. Negative for agitation, behavioral problems, hallucinations, self-injury and suicidal ideas. The patient is not nervous/anxious.     Blood pressure (!) 148/96, pulse 76, temperature 98.1 F (36.7 C), temperature source Oral, resp. rate 20, height 6\' 1"  (1.854 m), weight 125.6 kg, SpO2 98 %.Body mass index is 36.55 kg/m.  General Appearance: Fairly Groomed  Eye Contact:  Fair  Speech:  Slow  Volume:  Decreased  Mood:  Dysphoric  Affect:  Flat  Thought Process:  Goal Directed and Descriptions of Associations: Circumstantial  Orientation:  Full (Time, Place, and Person)  Thought Content:  Delusions, Paranoid Ideation, Rumination and Denies AVH   Suicidal Thoughts:  No  Homicidal Thoughts:  No  Memory:  Immediate;   Fair Recent;   Fair Remote;   Fair  Judgement:  Impaired  Insight:  Lacking  Psychomotor Activity:  Normal  Concentration:  Concentration: Fair  Recall:  of Knowledge:  Fair  Language:  Good  Akathisia:  Negative  Handed:  Right  AIMS (if indicated):     Assets:  Desire for Improvement Housing Resilience  ADL's:  Intact  Cognition:  WNL  Sleep:  Number of Hours: 3.25    Treatment Plan Summary: Daily contact with patient to assess and evaluate symptoms and progress in treatment and Medication management  Patient is seen and examined.  Patient is a 44 year old male with the above-stated past psychiatric history who was admitted following what appears to be an accidental overdose on psychotropic medication.  He has been medically cleared.  He will be admitted to the behavioral health hospital.  He will be integrated in  the milieu.  He will be encouraged to attend groups.  Patient has been compliant with long-acting injectable antipsychotics.  His mood is stable today.  We will slowly reintroduce medications as needed to simplify polypharmacy and multidose medication-Invega tablet 9 mg p.o. at night for single agent antipsychotic.  There were also be available Haldol as needed for agitation.  Also have available hydroxyzine for anxiety and lorazepam for significant agitation. He will also have available Cogentin as needed for side effects.   He will also have available trazodone 50 mg p.o. nightly as needed insomnia.    Observation Level/Precautions:  15 minute checks  Laboratory:  Reviewed labs as in HPI above  Psychotherapy: Attend group therapy on the unit and interact with milieu  Medications: Invega 9 mg p.o. at night; NicoDerm CQ for nicotine replacement; agitation protocol orders  Consultations: None  Discharge Concerns: Medication management  Estimated LOS: 3-7 days  Other: Independent living  versus group home   Physician Treatment Plan for Primary Diagnosis: Schizophrenia (HCC) Long Term Goal(s): Improvement in symptoms so as ready for discharge  Short Term Goals: Ability to identify changes in lifestyle to reduce recurrence of condition will improve, Ability to verbalize feelings will improve, Ability to demonstrate self-control will improve, Ability to identify and develop effective coping behaviors will improve, Ability to maintain clinical measurements within normal limits will improve and Compliance with prescribed medications will improve  Physician Treatment Plan for Secondary Diagnosis: Principal Problem:   Schizophrenia (HCC) Active Problems:   Tobacco use disorder   Overdose  Long Term Goal(s): Improvement in symptoms so as ready for discharge  Short Term Goals: Ability to identify changes in lifestyle to reduce recurrence of condition will improve, Ability to verbalize feelings will improve, Ability to demonstrate self-control will improve, Ability to identify and develop effective coping behaviors will improve, Ability to maintain clinical measurements within normal limits will improve and Compliance with prescribed medications will improve  I certify that inpatient services furnished can reasonably be expected to improve the patient's condition.    Mariel Craft, MD 10/29/20212:04 PM    He changed his number so he wasn't able to get his number

## 2020-02-25 NOTE — Tx Team (Signed)
Interdisciplinary Treatment and Diagnostic Plan Update  02/25/2020 Time of Session: 1025am [name] MRN: [id number]  Principal Diagnosis: Schizophrenia [facility])  Secondary Diagnoses: Principal Problem:   Schizophrenia ([place]) Active Problems:   Tobacco use disorder   Overdose   Current Medications:  Current Facility-Administered Medications  Medication Dose Route Frequency Provider Last Rate Last Admin  . acetaminophen (TYLENOL) tablet 650 mg  650 mg Oral Q6H PRN Mordecai Maes, NP   650 mg at 02/25/20 0351  . alum & mag hydroxide-simeth (MAALOX/MYLANTA) 200-200-20 MG/5ML suspension 30 mL  30 mL Oral Q4H PRN Mordecai Maes, NP      . magnesium hydroxide (MILK OF MAGNESIA) suspension 30 mL  30 mL Oral Daily PRN Mordecai Maes, NP      . traZODone (DESYREL) tablet 100 mg  100 mg Oral QHS PRN Deloria Lair, NP       PTA Medications: No medications prior to admission.    Patient Stressors: Marital or family conflict Medication change or noncompliance  Patient Strengths: Technical sales engineer for treatment/growth  Treatment Modalities: Medication Management, Group therapy, Case management,  1 to 1 session with clinician, Psychoeducation, Recreational therapy.   Physician Treatment Plan for Primary Diagnosis: Schizophrenia ([place]) Long Term Goal(s):     Short Term Goals:    Medication Management: Evaluate patient's response, side effects, and tolerance of medication regimen.  Therapeutic Interventions: 1 to 1 sessions, Unit Group sessions and Medication administration.  Evaluation of Outcomes: Not Met  Physician Treatment Plan for Secondary Diagnosis: Principal Problem:   Schizophrenia ([place]) Active Problems:   Tobacco use disorder   Overdose  Long Term Goal(s):     Short Term Goals:       Medication Management: Evaluate patient's response, side effects, and tolerance of medication regimen.  Therapeutic Interventions: 1 to 1 sessions, Unit  Group sessions and Medication administration.  Evaluation of Outcomes: Not Met   RN Treatment Plan for Primary Diagnosis: Schizophrenia ([place]) Long Term Goal(s): Knowledge of disease and therapeutic regimen to maintain health will improve  Short Term Goals: Ability to demonstrate self-control, Ability to verbalize feelings will improve and Ability to identify and develop effective coping behaviors will improve  Medication Management: RN will administer medications as ordered by provider, will assess and evaluate patient's response and provide education to patient for prescribed medication. RN will report any adverse and/or side effects to prescribing provider.  Therapeutic Interventions: 1 on 1 counseling sessions, Psychoeducation, Medication administration, Evaluate responses to treatment, Monitor vital signs and CBGs as ordered, Perform/monitor CIWA, COWS, AIMS and Fall Risk screenings as ordered, Perform wound care treatments as ordered.  Evaluation of Outcomes: Not Met   LCSW Treatment Plan for Primary Diagnosis: Schizophrenia ([place]) Long Term Goal(s): Safe transition to appropriate next level of care at discharge, Engage patient in therapeutic group addressing interpersonal concerns.  Short Term Goals: Engage patient in aftercare planning with referrals and resources, Increase ability to appropriately verbalize feelings, Facilitate patient progression through stages of change regarding substance use diagnoses and concerns and Identify triggers associated with mental health/substance abuse issues  Therapeutic Interventions: Assess for all discharge needs, 1 to 1 time with Social worker, Explore available resources and support systems, Assess for adequacy in community support network, Educate family and significant other(s) on suicide prevention, Complete Psychosocial Assessment, Interpersonal group therapy.  Evaluation of Outcomes: Not Met   Progress in Treatment: Attending groups:  Yes. Participating in groups: Yes. Taking medication as prescribed: Yes. Toleration medication: Yes. Family/Significant other  contact made: No, will contact:  CSW will obtain consents Patient understands diagnosis: Yes. Discussing patient identified problems/goals with staff: Yes. Medical problems stabilized or resolved: Yes. Denies suicidal/homicidal ideation: Yes. Issues/concerns per patient self-inventory: Yes. Other:   New problem(s) identified: No, Describe:  CSW will continue to assess  New Short Term/Long Term Goal(s):medication stabilization, elimination of SI thoughts, development of comprehensive mental wellness plan.  Patient Goals:  "to grow my hair back out thick"  Discharge Plan or Barriers: Patient recently admitted. CSW will continue to follow and assess for appropriate referrals and possible discharge planning.  Reason for Continuation of Hospitalization: Depression Medication stabilization  Estimated Length of Stay: 3-5 days  Attendees: Patient: [name] 02/25/2020 10:42 AM  Physician: Dr. Leverne Humbles 02/25/2020 10:42 AM  Nursing:  02/25/2020 10:42 AM  RN Care Manager: 02/25/2020 10:42 AM  Social Worker: Toney Reil, [place] 02/25/2020 10:42 AM  Recreational Therapist:  02/25/2020 10:42 AM  Other:  02/25/2020 10:42 AM  Other:  02/25/2020 10:42 AM  Other: 02/25/2020 10:42 AM    Scribe for Treatment Team: Mliss Fritz, [place] 02/25/2020 10:42 AM

## 2020-02-25 NOTE — Progress Notes (Signed)
Adult Psychoeducational Group Note  Date:  02/25/2020 Time:  10:41 PM  Group Topic/Focus:  Wrap-Up Group:   The focus of this group is to help patients review their daily goal of treatment and discuss progress on daily workbooks.  Participation Level:  Active  Participation Quality:  Appropriate  Affect:  Anxious  Cognitive:  Appropriate  Insight: Good  Engagement in Group:  Developing/Improving  Modes of Intervention:  Education and Support  Additional Comments: Doris took part in the group activity  Judithann Sheen 02/25/2020, 10:41 PM

## 2020-02-25 NOTE — BHH Group Notes (Signed)
BHH LCSW Group Therapy  02/25/2020 11:49 AM  Type of Therapy:  Group Therapy  Participation Level:  Active  Participation Quality:  Drowsy  Affect:  Labile  Cognitive:  Appropriate  Insight:  Improving  Engagement in Therapy:  Limited  Modes of Intervention:  Activity, Discussion and Education  Summary of Progress/Problems: Pt attended group. He offered limited engagement in discussion. Pt dozed off multiple times during group and left the group early.   Antionette Luster A Elliette Seabolt 02/25/2020, 11:49 AM
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
## 2020-02-25 NOTE — Progress Notes (Signed)
Pt continues to pace unit and is disorganized at times but overall pleasant on the unit. Pt needs redirection at times    02/25/20 2200  Psych Admission Type (Psych Patients Only)  Admission Status Involuntary  Psychosocial Assessment  Patient Complaints Anxiety;Worrying;Suspiciousness;Restlessness;Hyperactivity  Eye Contact Fair  Facial Expression Sad  Affect Appropriate to circumstance  Speech Logical/coherent  Interaction Assertive  Motor Activity Slow  Appearance/Hygiene Disheveled  Behavior Characteristics Cooperative;Intrusive  Mood Preoccupied;Pleasant  Aggressive Behavior  Effect No apparent injury  Thought Process  Coherency Concrete thinking  Content WDL  Delusions None reported or observed  Perception WDL  Hallucination None reported or observed  Judgment Limited  Confusion None  Danger to Self  Current suicidal ideation? Denies  Danger to Others  Danger to Others None reported or observed

## 2020-02-25 NOTE — BHH Suicide Risk Assessment (Signed)
[facility] Admission Suicide Risk Assessment   Nursing information obtained from:  Patient Demographic factors:  Male, Low socioeconomic status, Living alone Current Mental Status:  NA Loss Factors:  NA Historical Factors:  Impulsivity Risk Reduction Factors:  NA  Total Time spent with patient: 70 minutes Principal Problem: Schizophrenia (HCC) Diagnosis:  Principal Problem:   Schizophrenia (HCC) Active Problems:   Tobacco use disorder   Overdose  Subjective Data: "I want to grow my hair back thick"  History of Present Illness: [name] is a 44 y.o. male with a history of schizophrenia who was admitted for medical admission following overdose on psychotropic medication.  After being medically cleared, he is admitted to behavioral health for further evaluation and medication management. Today, patient is seen and examined.   Nursing notes are reviewed, and treatment team held.  Patient is pleasant and oriented, however bizarre.  He states his goal for hospital admission is to grow his hair back thick.  Patient reports that he does not understand his medication.  He is living alone. He denies suicide attempt. He denies any past self harm.  No access to guns. Patient reports that he does not remember what medications he takes, and states that after he took medicine he couldn't see anybody's face and got scared.  Patient reports depression related to his apartment needing to be fixed up.  He describes that his oven does not work, and he has had to use a microwave.  He states that he has food stamps, but mostly eats out or food from a can.  He states, "I have been hated all my life.  People are jealous of a lifestyle that I could live".  He states that he could live a life of dating a lot of women, but admits that he has not had a date in over 10 years.  He states that he is lonely, but wants to continue living on his own.  Patient reports that he has been living in an apartment that is run down.   His rent is paid through a program with financial pathways.  He has a case Production designer, theatre/television/film through this program.  Denies drugs, alcohol use. Drinks coffee, energy drinks occasionally 1 ppd cigarettes since age 30 years. He denies access to any weapons. Patient's vital signs are stable with slightly elevated blood pressure, we will continue to monitor.  He reports good appetite, but poor sleep.  Nurses report only 3.25 hours of sleep last night.  He is denying any suicidal or homicidal ideation.  He is denying auditory or visual hallucinations today and does not appear to be responding to internal stimuli. Labs reviewed: CMP with mild hyponatremia and hypocalcemia; CBC, magnesium, ammonia within normal limits; Trileptal level elevated; lactic acid level elevated on admission, and normalized prior to being brought to behavioral health; HIV nonreactive  Patient gave permission to obtain collateral from mother,[name]: Family meeting by phone on 02/25/2020: Mother again states that she does not think patient ever took medication as had been instructed after last admission.  Patient and mother would like to minimize polypharmacy as well as multiday dosing so that patient can manage medication independently.  Discussed his living situation.  Mother confirms that patient's stove and refrigerator are arrested.  When she went to his apartment she found that his refrigerator was not even plugged in.  The stove does not work.  Discussed with patient the possibility of living in a group home.  Patient states that he is reluctant  to do this, as he recalls a friend from high school living in a group home and he did not like that.  Patient is open to the idea of more socialization as well as getting a new start, as he describes a poor relationship with his current neighbors and having increasing paranoia in regards to them.  Mother is encouraging patient to consider a group home.  Patient states that he would like to  stay in [place] so that he can continue to be near to where his mother lives in [place].   Continued Clinical Symptoms:  Alcohol Use Disorder Identification Test Final Score (AUDIT): 0 The "Alcohol Use Disorders Identification Test", Guidelines for Use in Primary Care, Second Edition.  World Science writer [facility]). Score between 0-7:  no or low risk or alcohol related problems. Score between 8-15:  moderate risk of alcohol related problems. Score between 16-19:  high risk of alcohol related problems. Score 20 or above:  warrants further diagnostic evaluation for alcohol dependence and treatment.   CLINICAL FACTORS:   Schizophrenia:   Paranoid or undifferentiated type  Musculoskeletal: Strength & Muscle Tone: within normal limits Gait & Station: normal Patient leans: N/A  Psychiatric Specialty Exam: Physical Exam Vitals and nursing note reviewed.  Constitutional:      Appearance: Normal appearance. He is obese.  HENT:     Head: Normocephalic and atraumatic.     Nose: Nose normal.  Eyes:     Extraocular Movements: Extraocular movements intact.  Cardiovascular:     Rate and Rhythm: Normal rate.  Pulmonary:     Effort: Pulmonary effort is normal.  Musculoskeletal:        General: Normal range of motion.     Cervical back: Normal range of motion.  Neurological:     General: No focal deficit present.     Mental Status: He is alert and oriented to person, place, and time.     Review of Systems  Constitutional: Negative.   Respiratory: Negative.   Cardiovascular: Negative.   Gastrointestinal: Negative.   Musculoskeletal: Negative.   Neurological: Negative.   Psychiatric/Behavioral: Positive for confusion, decreased concentration, dysphoric mood and sleep disturbance. Negative for agitation, behavioral problems, hallucinations, self-injury and suicidal ideas. The patient is not nervous/anxious.     Blood pressure (!) 148/96, pulse 76, temperature 98.1 F (36.7  C), temperature source Oral, resp. rate 20, height 6\' 1"  (1.854 m), weight 125.6 kg, SpO2 98 %.Body mass index is 36.55 kg/m.  General Appearance: Fairly Groomed  Eye Contact:  Fair  Speech:  Slow  Volume:  Decreased  Mood:  Dysphoric  Affect:  Flat  Thought Process:  Goal Directed and Descriptions of Associations: Circumstantial  Orientation:  Full (Time, Place, and Person)  Thought Content:  Delusions, Paranoid Ideation, Rumination and Denies AVH  Suicidal Thoughts:  No  Homicidal Thoughts:  No  Memory:  Immediate;   Fair Recent;   Fair Remote;   Fair  Judgement:  Impaired  Insight:  Lacking  Psychomotor Activity:  Normal  Concentration:  Concentration: Fair  Recall:  of Knowledge:  Fair  Language:  Good  Akathisia:  Negative  Handed:  Right  AIMS (if indicated):     Assets:  Desire for Improvement Housing Resilience  ADL's:  Intact  Cognition:  WNL  Sleep:  Number of Hours: 3.25    COGNITIVE FEATURES THAT CONTRIBUTE TO RISK:  Thought constriction (tunnel vision)    SUICIDE RISK:   Mild:  Suicidal ideation  of limited frequency, intensity, duration, and specificity.  There are no identifiable plans, no associated intent, mild dysphoria and related symptoms, good self-control (both objective and subjective assessment), few other risk factors, and identifiable protective factors, including available and accessible social support.  PLAN OF CARE:  Treatment Plan Summary: Daily contact with patient to assess and evaluate symptoms and progress in treatment and Medication management  Patient is seen and examined.  Patient is a 44 year old male with the above-stated past psychiatric history who was admitted following what appears to be an accidental overdose on psychotropic medication.  He has been medically cleared.  He will be admitted to the behavioral health hospital.  He will be integrated in the milieu.  He will be encouraged to attend groups.  Patient has been  compliant with long-acting injectable antipsychotics.  His mood is stable today.  We will slowly reintroduce medications as needed to simplify polypharmacy and multidose medication-Invega tablet 9 mg p.o. at night for single agent antipsychotic.  There were also be available Haldol as needed for agitation.  Also have available hydroxyzine for anxiety and lorazepam for significant agitation. He will also have available Cogentin as needed for side effects.   He will also have available trazodone 50 mg p.o. nightly as needed insomnia.    Observation Level/Precautions:  15 minute checks  Laboratory:  Reviewed labs as in HPI above  Psychotherapy: Attend group therapy on the unit and interact with milieu  Medications: Invega 9 mg p.o. at night; NicoDerm CQ for nicotine replacement; agitation protocol orders  Consultations: None  Discharge Concerns: Medication management  Estimated LOS: 3-7 days  Other: Independent living versus group home   Physician Treatment Plan for Primary Diagnosis: Schizophrenia (HCC) Long Term Goal(s): Improvement in symptoms so as ready for discharge  Short Term Goals: Ability to identify changes in lifestyle to reduce recurrence of condition will improve, Ability to verbalize feelings will improve, Ability to demonstrate self-control will improve, Ability to identify and develop effective coping behaviors will improve, Ability to maintain clinical measurements within normal limits will improve and Compliance with prescribed medications will improve  Physician Treatment Plan for Secondary Diagnosis: Principal Problem:   Schizophrenia (HCC) Active Problems:   Tobacco use disorder   Overdose  Long Term Goal(s): Improvement in symptoms so as ready for discharge  Short Term Goals: Ability to identify changes in lifestyle to reduce recurrence of condition will improve, Ability to verbalize feelings will improve, Ability to demonstrate self-control will improve, Ability to  identify and develop effective coping behaviors will improve, Ability to maintain clinical measurements within normal limits will improve and Compliance with prescribed medications will improve    I certify that inpatient services furnished can reasonably be expected to improve the patient's condition.   Mariel Craft, MD 02/25/2020, 6:54 PM

## 2020-02-25 NOTE — Progress Notes (Signed)
Pt been up much of the night not being able to sleep. Pt refused the Trazodone because he stated it causes him to have hard time breathing.

## 2020-02-26 MED ORDER — ZOLPIDEM TARTRATE 5 MG PO TABS
5.0000 mg | ORAL_TABLET | Freq: Every evening | ORAL | Status: DC | PRN
  Administered 2020-02-26 – 2020-02-28 (×3): 5 mg via ORAL
  Filled 2020-02-26 (×3): qty 1

## 2020-02-26 MED ORDER — PALIPERIDONE PALMITATE ER 234 MG/1.5ML IM SUSY
234.0000 mg | PREFILLED_SYRINGE | Freq: Once | INTRAMUSCULAR | Status: AC
  Administered 2020-02-26: 234 mg via INTRAMUSCULAR
  Filled 2020-02-26: qty 1.5

## 2020-02-26 NOTE — [facility] Group Notes (Signed)
.  Psychoeducational Group Note    Date: 02-26-20 Time: 0900-1000    Goal Setting   Purpose of Group: This group helps to provide patients with the steps of setting a goal that is specific, measurable, attainable, realistic and time specific. A discussion on how we keep ourselves stuck with negative self talk.    Participation Level:  Active  Participation Quality:  Appropriate  Affect:  Appropriate  Cognitive:  Appropriate  Insight:  Improving  Engagement in Group:  Engaged  Additional Comments:  Pt will occasional interrupt what others are saying and start talking about his mothers parrots and how one of them cursed them out   Vira Blanco A

## 2020-02-26 NOTE — [facility] Group Notes (Signed)
Psychoeducational Group Note  Date: 02-26-20 Time:  9355-2174  Group Topic/Focus:  Identifying Needs:   The focus of this group is to help patients identify their personal needs that have been historically problematic and identify healthy behaviors to address their needs.  Participation Level:  Active  Participation Quality:  inappropriate  Affect:  Excited  Cognitive:  Disorganized  Insight:  Lacking  Engagement in Group:  Engaged  Additional Comments:  Pt is inappropriate in his sharing. Talked about UFO's   [name]

## 2020-02-26 NOTE — Progress Notes (Signed)
[facility] MD Progress Note  02/26/2020 11:02 AM [name]  MRN:  [id number]   Subjective:  "I didn't sleep well, because I didn't j**k off last night or smoke cigarettes"  History of Present Illness: [name] is a 44 y.o. male with a history of schizophrenia who was admitted for medical admission following overdose on psychotropic medication.  After being medically cleared, he is admitted to behavioral health for further evaluation and medication management.  Objective: Patient is seen and examined.  Patient is a 44 year old male with the above-stated past psychiatric history who is seen in follow-up.  Today, patient continues to have delusions of grandeur is being the Clinical research associate of [employer].  He revels in his past of being a great basketball player in high school, and jobs that he could have gotten.  He states, "everybody wants to be me.  He states that in 1994 he tried to get into the [employer], but was not let in.  He is seen being pleasant with peers on the unit.  He is attending groups, but has difficulty staying.  He does attend recreational therapy and is active.  He is seen walking in the halls, singing rap music.  Discussed discharge planning with patient, and he is reluctant to go to a group home.  He requests the phone number for [facility] on [facility] in [place], which is provided for him.  Is denying any suicidal or homicidal ideation.  He does not appear to be responding to internal stimuli, and he denies auditory and visual hallucinations.  His sleep was poor last night, nursing reports only 3.25 hours.  Patient states that he was out of his typical night routine, and that is why he could not sleep.  He states in the past he has taken Ambien which was effective.  Discussed with patient that we could try this tonight in the hospital, but he would not be discharged with this and he states that that is fine.  His appetite is good, his vital signs are stable and he is  afebrile.  Please see family meeting note on 02/25/2020 H&P. Call to mother was made again today to discuss housing.  Mother states that patient would do well at the [facility] apartments.  She states that he has lived there before.  Mother went to his current apartment, and got all his clothes and has washed them.  He does have some furniture/new bed that he would need to move to his new living assignment.  Mother is aware that patient would need to return to his old apartment prior to transitioning to another living setting.  Patient is aware that he will need to contact his payee to make arrangements to live in a new apartment.  Principal Problem: Schizophrenia (HCC) Diagnosis: Principal Problem:   Schizophrenia (HCC) Active Problems:   Tobacco use disorder   Overdose  Total Time spent with patient: 40 minutes  Past Psychiatric History: See admission H&P  Past Medical History:  Past Medical History:  Diagnosis Date  . Bipolar disorder (HCC)   . Bronchitis   . Hyperlipidemia   . Paranoid schizophrenia (HCC)   . Schizophrenia [facility])     Past Surgical History:  Procedure Laterality Date  . TESTICLE IMPLANTATION TO THIGH    . TESTICLE SURGERY     Family History:  Family History  Problem Relation Age of Onset  . Schizophrenia Neg Hx    Family Psychiatric  History: See admission H&P Social History:  Social  History   Substance and Sexual Activity  Alcohol Use Not Currently   Comment: weekly     Social History   Substance and Sexual Activity  Drug Use Not Currently  . Types: Marijuana    Social History   Socioeconomic History  . Marital status: Single    Spouse name: Not on file  . Number of children: Not on file  . Years of education: 12 years  . Highest education level: 12th grade  Occupational History  . Occupation: On disability  Tobacco Use  . Smoking status: Current Every Day Smoker    Packs/day: 1.00    Types: Cigarettes  . Smokeless tobacco: Never Used   Vaping Use  . Vaping Use: Never used  Substance and Sexual Activity  . Alcohol use: Not Currently    Comment: weekly  . Drug use: Not Currently    Types: Marijuana  . Sexual activity: Yes    Birth control/protection: Condom  Other Topics Concern  . Not on file  Social History Narrative   Pt lives alone in apartment complex for mentally ill   Social Determinants of Health   Financial Resource Strain:   . Difficulty of Paying Living Expenses: Not on file  Food Insecurity:   . Worried About Programme researcher, broadcasting/film/video in the Last Year: Not on file  . Ran Out of Food in the Last Year: Not on file  Transportation Needs:   . Lack of Transportation (Medical): Not on file  . Lack of Transportation (Non-Medical): Not on file  Physical Activity:   . Days of Exercise per Week: Not on file  . Minutes of Exercise per Session: Not on file  Stress:   . Feeling of Stress : Not on file  Social Connections:   . Frequency of Communication with Friends and Family: Not on file  . Frequency of Social Gatherings with Friends and Family: Not on file  . Attends Religious Services: Not on file  . Active Member of Clubs or Organizations: Not on file  . Attends Banker Meetings: Not on file  . Marital Status: Not on file   Additional Social History:                         Sleep: Poor  Appetite:  Good  Current Medications: Current Facility-Administered Medications  Medication Dose Route Frequency Provider Last Rate Last Admin  . acetaminophen (TYLENOL) tablet 650 mg  650 mg Oral Q6H PRN Denzil Magnuson, NP   650 mg at 02/25/20 1247  . alum & mag hydroxide-simeth (MAALOX/MYLANTA) 200-200-20 MG/5ML suspension 30 mL  30 mL Oral Q4H PRN Denzil Magnuson, NP   30 mL at 02/26/20 0441  . benztropine (COGENTIN) tablet 2 mg  2 mg Oral BID PRN Mariel Craft, MD   2 mg at 02/25/20 2126   Or  . benztropine mesylate (COGENTIN) injection 2 mg  2 mg Intramuscular BID PRN Mariel Craft, MD      . haloperidol (HALDOL) tablet 10 mg  10 mg Oral Q6H PRN Mariel Craft, MD       Or  . haloperidol lactate (HALDOL) injection 10 mg  10 mg Intramuscular Q6H PRN Mariel Craft, MD      . hydrOXYzine (ATARAX/VISTARIL) tablet 25 mg  25 mg Oral Q6H PRN Mariel Craft, MD      . LORazepam (ATIVAN) tablet 2 mg  2 mg Oral Q6H PRN Mariel Craft,  MD   2 mg at 02/25/20 2126   Or  . LORazepam (ATIVAN) injection 2 mg  2 mg Intramuscular Q6H PRN Mariel CraftMaurer, Hubbard Seldon M, MD      . magnesium hydroxide (MILK OF MAGNESIA) suspension 30 mL  30 mL Oral Daily PRN Denzil Magnusonhomas, Lashunda, NP      . nicotine (NICODERM CQ - dosed in mg/24 hours) patch 21 mg  21 mg Transdermal Daily Mariel CraftMaurer, Jaquelyn Sakamoto M, MD   21 mg at 02/26/20 [id number]  . paliperidone (INVEGA) 24 hr tablet 9 mg  9 mg Oral QHS Mariel CraftMaurer, Cayci Mcnabb M, MD   9 mg at 02/25/20 2126    Lab Results: No results found for this or any previous visit (from the past 48 hour(s)).  Blood Alcohol level:  Lab Results  Component Value Date   ETH <10 02/23/2020   ETH <10 02/01/2020    Metabolic Disorder Labs: Lab Results  Component Value Date   HGBA1C 5.5 02/03/2020   MPG 111.15 02/03/2020   MPG 102.54 06/01/2019   Lab Results  Component Value Date   PROLACTIN 8.1 06/01/2019   Lab Results  Component Value Date   CHOL 172 02/03/2020   TRIG 202 (H) 02/03/2020   HDL 35 (L) 02/03/2020   CHOLHDL 4.9 02/03/2020   VLDL 40 02/03/2020   LDLCALC 97 02/03/2020   LDLCALC 138 (H) 06/01/2019    Physical Findings: AIMS: Facial and Oral Movements Muscles of Facial Expression: None, normal Lips and Perioral Area: None, normal Jaw: None, normal Tongue: None, normal,Extremity Movements Upper (arms, wrists, hands, fingers): None, normal Lower (legs, knees, ankles, toes): None, normal, Trunk Movements Neck, shoulders, hips: None, normal, Overall Severity Severity of abnormal movements (highest score from questions above): None, normal Incapacitation due to abnormal  movements: None, normal Patient's awareness of abnormal movements (rate only patient's report): No Awareness, Dental Status Current problems with teeth and/or dentures?: No Does patient usually wear dentures?: No  CIWA:  CIWA-Ar Total: 0 COWS:  COWS Total Score: 1  Musculoskeletal: Strength & Muscle Tone: within normal limits Gait & Station: normal Patient leans: N/A  Psychiatric Specialty Exam: Physical Exam Vitals and nursing note reviewed.  Constitutional:      Appearance: Normal appearance.  HENT:     Head: Normocephalic and atraumatic.     Nose: Nose normal.  Eyes:     Extraocular Movements: Extraocular movements intact.  Cardiovascular:     Rate and Rhythm: Normal rate.  Pulmonary:     Effort: Pulmonary effort is normal. No respiratory distress.  Musculoskeletal:        General: Normal range of motion.     Cervical back: Normal range of motion.  Neurological:     General: No focal deficit present.     Mental Status: He is alert and oriented to person, place, and time.     Review of Systems  Constitutional: Negative.   Respiratory: Negative.   Cardiovascular: Negative.   Gastrointestinal: Negative.   Musculoskeletal: Negative.   Neurological: Negative.   Psychiatric/Behavioral: Positive for sleep disturbance. Negative for agitation, behavioral problems, confusion, decreased concentration, dysphoric mood, hallucinations, self-injury and suicidal ideas. The patient is not nervous/anxious.     Blood pressure 138/86, pulse 76, temperature 97.6 F (36.4 C), temperature source Oral, resp. rate 20, height 6\' 1"  (1.854 m), weight 125.6 kg, SpO2 100 %.Body mass index is 36.55 kg/m.  General Appearance: Casual  Eye Contact:  Good  Speech:  Normal Rate  Volume:  Normal  Mood:  Euthymic  Affect:  Congruent and Constricted  Thought Process:  Disorganized, Irrelevant and Descriptions of Associations: Loose  Orientation:  Full (Time, Place, and Person)  Thought Content:   Delusions and Hallucinations: Denies AVH  Suicidal Thoughts:  No  Homicidal Thoughts:  No  Memory:  Immediate;   Fair Recent;   Fair Remote;   Fair  Judgement:  Impaired  Insight:  Fair  Psychomotor Activity:  Normal  Concentration:  Concentration: Fair and Attention Span: Fair  Recall:  [employer] of Knowledge:  Fair  Language:  Fair  Akathisia:  Negative  Handed:  Right  AIMS (if indicated):     Assets:  Desire for Improvement Housing Resilience  ADL's:  Intact  Cognition:  WNL  Sleep:  Number of Hours: 3.25     Treatment Plan Summary: Daily contact with patient to assess and evaluate symptoms and progress in treatment, Medication management and Plan : Patient is seen and examined.  Patient is a 44 year old male with the above-stated past psychiatric history who is seen in follow-up.   Diagnosis: 1. Schizophrenia  Pertinent findings on examination today: 1.  Continued grandiose delusions 2.  Continued paranoid delusions but stable. 3.  Sleep is still poor with 3.25 hours a night.  Plan: 1.  Continue paliperidone to 9 mg p.o. nightly to aid with psychosis as well as sleep. 2.  Hinda Glatter Sustenna 234 mg IM today 02/25/2020    3.  Hydroxyzine 25 mg every 6 hours as needed for anxiety 4.  Continue Haldol agitation protocol as needed. 5.  Discontinue trazodone 100 mg p.o. nightly as needed insomnia, due to patient complaints of difficulty with breathing. 6.  Start Ambien 5 mg daily at bedtime for insomnia 7.  NicoDerm CQ patch 21 mg per nicotine replacement 8.  Continue Cogentin 2 mg p.o. or IM for dystonia 9.  Disposition planning-in progress. 10.Discussed with social work possibility of placing in a group home after discharge vs new independent living apartment. He is interested in moving to [facility] apartments on [address]. in [place].  We talked to mom who is ok with that.  Need to get ahold of [facility] to assist him in relocation.     Mariel Craft, MD 02/26/2020, 11:02 AM

## 2020-02-26 NOTE — B[facility] Counselor (Signed)
CSW contacted mother [name] [phone]) to see if she knew when pt had last injection. Mother stated she was unsure as pt is secretive about his medications.   Fredirick Lathe, LCSWA Clinicial Social Worker [employer]

## 2020-02-26 NOTE — Progress Notes (Signed)
DAR NOTE: Patient presents with anxious affect and depressed mood.  Denies suicidal thoughtspain, auditory and visual hallucinations.  Described energy level as high and concentration as good.  Rates depression at 0, hopelessness at 0, and anxiety at 0.  Maintained on routine safety checks.  Medications given as prescribed.  Support and encouragement offered as needed.  Attended group and participated.  States goal for today is "better pain medicine."  Patient observed socializing with peers in the dayroom.  Pacing the hallway responding to internal stimuli.  patient is safe on and off the unit.  Offered no complaint.

## 2020-02-27 MED ORDER — PALIPERIDONE PALMITATE ER 234 MG/1.5ML IM SUSY: 234.0000 mg | PREFILLED_SYRINGE | INTRAMUSCULAR | Status: DC

## 2020-02-27 NOTE — Plan of Care (Signed)
Progress note  D: pt found in the hallway pacing; compliant with medication administration. Pt is preoccupied with their cell phone and relationship with their father. Pt can be tangential with their conversations sometimes, and seems to be responding at times while walking the hall. Pt denies this though. Pt denies si/hi/ah/vh and verbally agrees to approach staff if these become apparent or before harming themself/others while at bhh.  A: Pt provided support and encouragement. Pt given medication per protocol and standing orders. Q39m safety checks implemented and continued.  R: Pt safe on the unit. Will continue to monitor.  Pt progressing in the following metrics  Problem: Education: Goal: Knowledge of [place] [facility] Education information/materials will improve Outcome: Progressing Goal: Emotional status will improve Outcome: Progressing Goal: Mental status will improve Outcome: Progressing Goal: Verbalization of understanding the information provided will improve Outcome: Progressing

## 2020-02-27 NOTE — Progress Notes (Signed)
   02/27/20 2313  Psych Admission Type (Psych Patients Only)  Admission Status Involuntary  Psychosocial Assessment  Patient Complaints Anxiety  Eye Contact Fair  Facial Expression Animated;Anxious;Pensive;Sullen;Sad;Worried  Affect Anxious;Preoccupied;Sad;Sullen  Speech Logical/coherent;Soft  Interaction Assertive  Motor Activity Fidgety;Pacing;Restless  Appearance/Hygiene Unremarkable  Behavior Characteristics Cooperative  Mood Anxious  Thought Process  Coherency Concrete thinking;Disorganized  Content Blaming others;Preoccupation  Delusions None reported or observed  Perception WDL  Hallucination None reported or observed  Judgment Limited  Confusion Mild  Danger to Self  Current suicidal ideation? Denies  Danger to Others  Danger to Others None reported or observed

## 2020-02-27 NOTE — Progress Notes (Signed)
[facility] MD Progress Note  02/27/2020 1:03 PM [name]  MRN:  [id number]   Subjective:  " I can feel better if I drink a cherry Coke and have cigarettes."  History of Present Illness: [name] is a 44 y.o. male with a history of schizophrenia who was admitted for medical admission following overdose on psychotropic medication, likely accidental.  After being medically cleared, he was admitted to behavioral health for further evaluation and medication management.  Objective: Patient is seen and examined.  Patient is a 44 year old male with the above-stated past psychiatric history who is seen in follow-up.  Today, patient endorses that he sometimes feels depressed, because he did not speak out and introduce himself to famous people when he was with them.  He continues to have delusions of grandeur about being a basketball star and plans to intend with an Olympic athlete, stating he could get back into shape if he worked out with him.  He also states he intends to grow his hair out back into a thick Afro.  Patient is pleasantly interactive with staff and peers.  He can be hyperverbal with tangential speech.  He has been attending groups and compliant with medication.  He denies side effects.  His sleep continues to be poor despite addition of Ambien.  His appetite is good.  He denies any suicidal or homicidal ideation.  He does not appear to be responding to internal stimuli, and denies AVH nurses document 3.25 hours of sleep.  Vital signs are stable with exception of slight tachycardia at 101 bpm.  He is afebrile  Please see family meeting note on 02/25/2020 H&P. 02/26/2020 family meeting with mother to discuss housing.  Mother states that patient would do well at the [facility] apartments.  She states that he has lived there before.  Mother went to his current apartment, and got all his clothes and has washed them.  He does have some furniture/new bed that he would need to move to his new living  assignment.  Mother is aware that patient would need to return to his old apartment prior to transitioning to another living setting.  Patient is aware that he will need to contact his payee to make arrangements to live in a new apartment.  Principal Problem: Schizophrenia (HCC) Diagnosis: Principal Problem:   Schizophrenia (HCC) Active Problems:   Tobacco use disorder   Overdose  Total Time spent with patient: 35 minutes  Past Psychiatric History: See admission H&P  Past Medical History:  Past Medical History:  Diagnosis Date  . Bipolar disorder (HCC)   . Bronchitis   . Hyperlipidemia   . Paranoid schizophrenia (HCC)   . Schizophrenia [facility])     Past Surgical History:  Procedure Laterality Date  . TESTICLE IMPLANTATION TO THIGH    . TESTICLE SURGERY     Family History:  Family History  Problem Relation Age of Onset  . Schizophrenia Neg Hx    Family Psychiatric  History: See admission H&P Social History:  Social History   Substance and Sexual Activity  Alcohol Use Not Currently   Comment: weekly     Social History   Substance and Sexual Activity  Drug Use Not Currently  . Types: Marijuana    Social History   Socioeconomic History  . Marital status: Single    Spouse name: Not on file  . Number of children: Not on file  . Years of education: 12 years  . Highest education level: 12th grade  Occupational History  . Occupation: On disability  Tobacco Use  . Smoking status: Current Every Day Smoker    Packs/day: 1.00    Types: Cigarettes  . Smokeless tobacco: Never Used  Vaping Use  . Vaping Use: Never used  Substance and Sexual Activity  . Alcohol use: Not Currently    Comment: weekly  . Drug use: Not Currently    Types: Marijuana  . Sexual activity: Yes    Birth control/protection: Condom  Other Topics Concern  . Not on file  Social History Narrative   Pt lives alone in apartment complex for mentally ill   Social Determinants of Health    Financial Resource Strain:   . Difficulty of Paying Living Expenses: Not on file  Food Insecurity:   . Worried About Programme researcher, broadcasting/film/video in the Last Year: Not on file  . Ran Out of Food in the Last Year: Not on file  Transportation Needs:   . Lack of Transportation (Medical): Not on file  . Lack of Transportation (Non-Medical): Not on file  Physical Activity:   . Days of Exercise per Week: Not on file  . Minutes of Exercise per Session: Not on file  Stress:   . Feeling of Stress : Not on file  Social Connections:   . Frequency of Communication with Friends and Family: Not on file  . Frequency of Social Gatherings with Friends and Family: Not on file  . Attends Religious Services: Not on file  . Active Member of Clubs or Organizations: Not on file  . Attends Banker Meetings: Not on file  . Marital Status: Not on file   Additional Social History:             Patient living independently in an apartment with nonworking appliances.            Sleep: Poor  Appetite:  Good  Current Medications: Current Facility-Administered Medications  Medication Dose Route Frequency Provider Last Rate Last Admin  . acetaminophen (TYLENOL) tablet 650 mg  650 mg Oral Q6H PRN Denzil Magnuson, NP   650 mg at 02/27/20 1246  . alum & mag hydroxide-simeth (MAALOX/MYLANTA) 200-200-20 MG/5ML suspension 30 mL  30 mL Oral Q4H PRN Denzil Magnuson, NP   30 mL at 02/26/20 0441  . benztropine (COGENTIN) tablet 2 mg  2 mg Oral BID PRN Mariel Craft, MD   2 mg at 02/25/20 2126   Or  . benztropine mesylate (COGENTIN) injection 2 mg  2 mg Intramuscular BID PRN Mariel Craft, MD      . haloperidol (HALDOL) tablet 10 mg  10 mg Oral Q6H PRN Mariel Craft, MD       Or  . haloperidol lactate (HALDOL) injection 10 mg  10 mg Intramuscular Q6H PRN Mariel Craft, MD      . hydrOXYzine (ATARAX/VISTARIL) tablet 25 mg  25 mg Oral Q6H PRN Mariel Craft, MD      . magnesium hydroxide  (MILK OF MAGNESIA) suspension 30 mL  30 mL Oral Daily PRN Denzil Magnuson, NP      . nicotine (NICODERM CQ - dosed in mg/24 hours) patch 21 mg  21 mg Transdermal Daily Mariel Craft, MD   21 mg at 02/27/20 3329  . paliperidone (INVEGA) 24 hr tablet 9 mg  9 mg Oral QHS Mariel Craft, MD   9 mg at 02/26/20 2034  . zolpidem (AMBIEN) tablet 5 mg  5 mg Oral QHS PRN  Mariel Craft, MD   5 mg at 02/26/20 2138    Lab Results: No results found for this or any previous visit (from the past 48 hour(s)).  Blood Alcohol level:  Lab Results  Component Value Date   ETH <10 02/23/2020   ETH <10 02/01/2020    Metabolic Disorder Labs: Lab Results  Component Value Date   HGBA1C 5.5 02/03/2020   MPG 111.15 02/03/2020   MPG 102.54 06/01/2019   Lab Results  Component Value Date   PROLACTIN 8.1 06/01/2019   Lab Results  Component Value Date   CHOL 172 02/03/2020   TRIG 202 (H) 02/03/2020   HDL 35 (L) 02/03/2020   CHOLHDL 4.9 02/03/2020   VLDL 40 02/03/2020   LDLCALC 97 02/03/2020   LDLCALC 138 (H) 06/01/2019    Physical Findings: AIMS: Facial and Oral Movements Muscles of Facial Expression: None, normal Lips and Perioral Area: None, normal Jaw: None, normal Tongue: None, normal,Extremity Movements Upper (arms, wrists, hands, fingers): None, normal Lower (legs, knees, ankles, toes): None, normal, Trunk Movements Neck, shoulders, hips: None, normal, Overall Severity Severity of abnormal movements (highest score from questions above): None, normal Incapacitation due to abnormal movements: None, normal Patient's awareness of abnormal movements (rate only patient's report): No Awareness, Dental Status Current problems with teeth and/or dentures?: No Does patient usually wear dentures?: No  CIWA:  CIWA-Ar Total: 0 COWS:  COWS Total Score: 1  Musculoskeletal: Strength & Muscle Tone: within normal limits Gait & Station: normal Patient leans: N/A  Psychiatric Specialty  Exam: Physical Exam Vitals and nursing note reviewed.  Constitutional:      Appearance: Normal appearance.  HENT:     Head: Normocephalic and atraumatic.     Nose: Nose normal.  Eyes:     Extraocular Movements: Extraocular movements intact.  Cardiovascular:     Rate and Rhythm: Normal rate.  Pulmonary:     Effort: Pulmonary effort is normal. No respiratory distress.  Musculoskeletal:        General: Normal range of motion.     Cervical back: Normal range of motion.  Neurological:     General: No focal deficit present.     Mental Status: He is alert and oriented to person, place, and time.     Review of Systems  Constitutional: Negative.   Respiratory: Negative.   Cardiovascular: Negative.   Gastrointestinal: Negative.   Musculoskeletal: Negative.   Neurological: Negative.   Psychiatric/Behavioral: Positive for sleep disturbance. Negative for agitation, behavioral problems, confusion, decreased concentration, dysphoric mood, hallucinations, self-injury and suicidal ideas. The patient is not nervous/anxious.     Blood pressure 137/89, pulse (!) 111, temperature 98.2 F (36.8 C), temperature source Oral, resp. rate 20, height 6\' 1"  (1.854 m), weight 125.6 kg, SpO2 98 %.Body mass index is 36.55 kg/m.  General Appearance: Casual  Eye Contact:  Good  Speech:  Normal Rate and Hyperverbal at times  Volume:  Normal  Mood:  Euthymic  Affect:  Congruent and Constricted  Thought Process:  Disorganized, Irrelevant and Descriptions of Associations: Loose  Orientation:  Full (Time, Place, and Person)  Thought Content:  Delusions, he denies AVH  Suicidal Thoughts:  No  Homicidal Thoughts:  No  Memory:  Immediate;   Fair Recent;   Fair Remote;   Fair  Judgement:  Impaired  Insight:  Fair  Psychomotor Activity:  Normal  Concentration:  Concentration: Fair and Attention Span: Fair  Recall:  of Knowledge:  Fair  Language:  Fair  Akathisia:  Negative  Handed:  Right   AIMS (if indicated):     Assets:  Desire for Improvement Housing Resilience  ADL's:  Intact  Cognition:  WNL  Sleep:  Number of Hours: 3.25     Treatment Plan Summary: Daily contact with patient to assess and evaluate symptoms and progress in treatment, Medication management and Plan : Patient is seen and examined.  Patient is a 44 year old male with the above-stated past psychiatric history who is seen in follow-up.   Diagnosis: 1. Schizophrenia  Pertinent findings on examination today: 1.  Continued grandiose delusions 2.  Paranoid delusions are improved 3.  Sleep is still poor with 3.25 hours a night.  Plan: 1.  Continue paliperidone to 9 mg p.o. nightly to aid with psychosis as well as sleep. 2.  Hinda GlatterInvega Sustenna 234 mg IM received 02/25/2020, next dose due 03/24/2020 3.  Hydroxyzine 25 mg every 6 hours as needed for anxiety 4.  Continue Haldol agitation protocol as needed. 5.  Continue Cogentin 2 mg p.o. or IM for dystonia 6.  Continue Ambien 5 mg daily at bedtime for insomnia 7.  NicoDerm CQ patch 21 mg per nicotine replacement 8.  Disposition planning-in progress. 9.  Discussed with social work possibility of placing in a group home after discharge vs new independent living apartment. 10. He is interested in [facility] to [facility] [address] [place].  Mom is ok with that.  Need to get ahold of [facility] to assist him in relocation.     Mariel CraftSHEILA M Lennon Richins, MD 02/27/2020, 1:03 PM

## 2020-02-28 DIAGNOSIS — F200 Paranoid schizophrenia: Secondary | ICD-10-CM | POA: Diagnosis not present

## 2020-02-28 LAB — CULTURE, BLOOD (SINGLE)
Culture: NO GROWTH
Special Requests: ADEQUATE

## 2020-02-28 MED ORDER — ZIPRASIDONE MESYLATE 20 MG IM SOLR: 20.0000 mg | Freq: Four times a day (QID) | INTRAMUSCULAR | Status: DC | PRN

## 2020-02-28 MED ORDER — HALOPERIDOL 5 MG PO TABS
10.0000 mg | ORAL_TABLET | ORAL | Status: AC
  Administered 2020-02-28: 10 mg via ORAL
  Filled 2020-02-28 (×2): qty 2

## 2020-02-28 MED ORDER — GABAPENTIN 300 MG PO CAPS
600.0000 mg | ORAL_CAPSULE | Freq: Three times a day (TID) | ORAL | Status: DC
  Filled 2020-02-28 (×4): qty 2

## 2020-02-28 MED ORDER — PALIPERIDONE ER 6 MG PO TB24
12.0000 mg | ORAL_TABLET | Freq: Every day | ORAL | Status: DC
  Administered 2020-02-28 – 2020-02-29 (×2): 12 mg via ORAL
  Filled 2020-02-28 (×3): qty 2

## 2020-02-28 MED ORDER — GABAPENTIN 400 MG PO CAPS
400.0000 mg | ORAL_CAPSULE | Freq: Three times a day (TID) | ORAL | Status: DC
  Filled 2020-02-28 (×2): qty 1

## 2020-02-28 NOTE — [facility] Suicide Risk Assessment (Signed)
[facility] INPATIENT:  Family/Significant Other Suicide Prevention Education   Suicide Prevention Education: Education Completed; Mother, [name] [phone]), has been identified by the patient as the family member/significant other with whom the patient will be residing, and identified as the person(s) who will aid the patient in the event of a mental health crisis (suicidal ideations/suicide attempt).  With written consent from the patient, the family member/significant other has been provided the following suicide prevention education, prior to the and/or following the discharge of the patient.  The suicide prevention education provided includes the following:  Suicide risk factors  Suicide prevention and interventions  National Suicide Hotline telephone number  [facility] assessment telephone number  [facility] Emergency Assistance 911  [facility] and/or Residential Mobile Crisis Unit telephone number   Request made of family/significant other to:  Remove weapons (e.g., guns, rifles, knives), all items previously/currently identified as safety concern.    Remove drugs/medications (over-the-counter, prescriptions, illicit drugs), all items previously/currently identified as a safety concern.   The family member/significant other verbalizes understanding of the suicide prevention education information provided.  The family member/significant other agrees to remove the items of safety concern listed above.  CSW spoke with this patients mother who stated that this patient had overdosed on his medications which led to this current hospitalization. Per patients mother this patient is already receiving ACTT services from [facility] in [place]. Patients mother stated that she and the pt came up with a compromise of this patient living at a more secure apartment [facility]. Per patients mother she and the patients ACT Team will work to get him moved to  these apartments post discharge.   Ruthann Cancer MSW, LCSW Clincal Social Worker  [facility]

## 2020-02-28 NOTE — Progress Notes (Signed)
D- Patient alert and oriented X 2. Patient affect/mood in very bizarre; he paces the hallway while chanting obscenities and random words. Denies SI, HI, AVH, and pain. Patient must be redirected with the usage of foul language, but complies when asked. Patient did not provide a daily goal, just stated " I am going to keep on talking until ya'll let me go home".   A- Scheduled medications administered to patient, per MD orders. Support and encouragement provided.  Routine safety checks conducted every 15 minutes.  Patient informed to notify staff with problems or concerns.  R- No adverse drug reactions noted. Patient contracts for safety at this time. Patient compliant with medications and treatment plan. Patient receptive, calm, and cooperative. Patient interacts well with others on the unit.  Patient remains safe at this time.            [place] NOVEL CORONAVIRUS (COVID-19) DAILY CHECK-OFF SYMPTOMS - answer yes or no to each - every day NO YES  Have you had a fever in the past 24 hours?   Fever (Temp > 37.80C / 100F) X    Have you had any of these symptoms in the past 24 hours?  New Cough   Sore Throat    Shortness of Breath   Difficulty Breathing   Unexplained Body Aches   X    Have you had any one of these symptoms in the past 24 hours not related to allergies?    Runny Nose   Nasal Congestion   Sneezing   X    If you have had runny nose, nasal congestion, sneezing in the past 24 hours, has it worsened?   X    EXPOSURES - check yes or no X    Have you traveled outside the state in the past 14 days?   X    Have you been in contact with someone with a confirmed diagnosis of COVID-19 or PUI in the past 14 days without wearing appropriate PPE?   X    Have you been living in the same home as a person with confirmed diagnosis of COVID-19 or a PUI (household contact)?     X    Have you been diagnosed with COVID-19?     X                                                                                                                              What to do next: Answered NO to all: Answered YES to anything:    Proceed with unit schedule Follow the [facility] Inpatient Flowsheet.

## 2020-02-28 NOTE — Progress Notes (Signed)
Pt continues to respond to internal stimuli, and is delusional at times. Pt paces the hall talking to people not seen by staff    02/28/20 2200  Psych Admission Type (Psych Patients Only)  Admission Status Involuntary  Psychosocial Assessment  Patient Complaints Suspiciousness  Eye Contact Fair  Facial Expression Animated;Anxious  Affect Anxious  Speech Logical/coherent  Interaction Assertive  Motor Activity Fidgety;Pacing;Restless  Appearance/Hygiene Unremarkable  Behavior Characteristics Cooperative  Mood Suspicious;Preoccupied  Thought Process  Coherency Incoherent  Content Preoccupation  Delusions None reported or observed  Perception Hallucinations  Hallucination None reported or observed  Judgment Limited  Confusion Mild  Danger to Self  Current suicidal ideation? Denies  Danger to Others  Danger to Others None reported or observed

## 2020-02-28 NOTE — [facility] Group Notes (Signed)
[facility] LCSW Group Therapy  02/28/2020 1:48 PM  Type of Therapy and Topic: Group Therapy: Anger Management   Description of Group: In this group, patients will learn helpful strategies and techniques to manage anger, express anger in alternative ways, change hostile attitudes, and prevent aggressive acts, such as verbal abuse and violence.This group will be process-oriented and eductional, with patients participating in exploration of their own experiences as well as giving and receiving support and challenge from other group members.  Therapeutic Goals: 1. Patient will learn to manage anger. 2. Patient will learn to stop violence or the threat of violence. 3. Patient will learn to develop self control over thoughts and actions. 4. Patient will receive support and feedback from others  Therapeutic Modalities: Cognitive Behavioral Therapy Solution Focused Therapy Motivational Interviewing  Type of Therapy:  Group Therapy  Participation Level:  Active  Participation Quality:  Monopolizing  Affect:  Excited  Cognitive:  Disorganized, Delusional and Hallucinating  Insight:  Off Topic  Engagement in Therapy:  Lacking  Modes of Intervention:  Discussion and Education  Summary of Progress/Problems: Patient attended and participated in group. This patient was disorganized and was difficult to redirect. Patient left group due to the voices talking to him from the "satelites and TV"   Sharyl Nimrod A Maryana Pittmon 02/28/2020, 1:48 PM

## 2020-02-28 NOTE — Progress Notes (Signed)
[facility]  02/28/2020 11:41 AM Nichola SizerBrian T Mayhall  MRN:  [id number] Subjective: Patient is a 44 year old male with a past psychiatric history significant for schizophrenia versus schizoaffective disorder who originally presented to the [facility] emergency department on 02/23/2020 after he had been found unresponsive.  The patient had apparently been noncompliant with his psychiatric medications, but had taken an overdose of an unspecified medication.  On arrival EMS stated the patient had been unresponsive but was protecting his airway.  Objective: Patient is seen and examined.  Patient is a 44 year old male with the above-stated past psychiatric history who is seen in follow-up.  Patient is familiar to me from a recent psychiatric hospitalization.  He had been admitted to our facility on 02/02/2020 under involuntary commitment by his mother.  Again it had been secondary to noncompliance with medications.  He admitted to auditory and visual hallucinations at that time.  He had been hospitalized in July of this year at [facility], and had been apparently treated with a long-acting Abilify injection.  On discharge she was started on the long-acting paliperidone injection.  It is unclear when he may have received his last long-acting paliperidone injection.  Review of the electronic medical record revealed that he was to get the long-acting injection after discharge, but the patient received that here on 03/24/2020.  On examination today the patient is still agitated.  He is pacing.  He still is quite delusional and disorganized.  He remains delusional disorganized even when he is improved, but this is more significant.  He received paliperidone 9 mg p.o. nightly last night.  His blood pressure this morning is 126/87, pulse was 109.  Pulse oximetry was 99% on room air.  He only slept 5.25 hours last night.  Review of his admission laboratories on 10/28 showed a mildly low sodium at 134, but  electrolytes otherwise were normal including liver function enzymes.  His ammonia was only 17.  He had a lipid panel from 10/7 that showed a mildly elevated triglyceride at 202.  CBC on 10/28 was essentially normal.  His absolute neutrophil count was mildly elevated at 12.9.  PT and INR were normal.  PTT was normal.  Acetaminophen was less than 10 and his salicylate was less than 7.  Glucose was 99 on 10/28.  TSH was normal at 1.137 on 02/03/2020.  HIV was negative.  Urinalysis was normal, drug screen was negative.  Blood alcohol was less than 10.  Blood culture from 5 days ago was negative.  Urine culture was as well negative.  CT scan of the head from 10/27 showed a questionable small infarct of uncertain age.  This was in the right medial pons/midbrain junction.  Otherwise negative.  An MRI to follow-up that CT showed no evidence of acute intracranial abnormality.  There was no acute infarct.  EKG showed a normal sinus rhythm with a QTc interval of 435.  Trileptal level on 10/27 was 52.  Principal Problem: Schizophrenia (HCC) Diagnosis: Principal Problem:   Schizophrenia (HCC) Active Problems:   Tobacco use disorder   Overdose  Total Time spent with patient: 20 minutes  Past Psychiatric History: See admission H&P  Past Medical History:  Past Medical History:  Diagnosis Date  . Bipolar disorder (HCC)   . Bronchitis   . Hyperlipidemia   . Paranoid schizophrenia (HCC)   . Schizophrenia [facility](HCC)     Past Surgical History:  Procedure Laterality Date  . TESTICLE IMPLANTATION TO THIGH    .  TESTICLE SURGERY     Family History:  Family History  Problem Relation Age of Onset  . Schizophrenia Neg Hx    Family Psychiatric  History: See admission H&P Social History:  Social History   Substance and Sexual Activity  Alcohol Use Not Currently   Comment: weekly     Social History   Substance and Sexual Activity  Drug Use Not Currently  . Types: Marijuana    Social History    Socioeconomic History  . Marital status: Single    Spouse name: Not on file  . Number of children: Not on file  . Years of education: 12 years  . Highest education level: 12th grade  Occupational History  . Occupation: On disability  Tobacco Use  . Smoking status: Current Every Day Smoker    Packs/day: 1.00    Types: Cigarettes  . Smokeless tobacco: Never Used  Vaping Use  . Vaping Use: Never used  Substance and Sexual Activity  . Alcohol use: Not Currently    Comment: weekly  . Drug use: Not Currently    Types: Marijuana  . Sexual activity: Yes    Birth control/protection: Condom  Other Topics Concern  . Not on file  Social History Narrative   Pt lives alone in apartment complex for mentally ill   Social Determinants of Health   Financial Resource Strain:   . Difficulty of Paying Living Expenses: Not on file  Food Insecurity:   . Worried About Programme researcher, broadcasting/film/video in the Last Year: Not on file  . Ran Out of Food in the Last Year: Not on file  Transportation Needs:   . Lack of Transportation (Medical): Not on file  . Lack of Transportation (Non-Medical): Not on file  Physical Activity:   . Days of Exercise per Week: Not on file  . Minutes of Exercise per Session: Not on file  Stress:   . Feeling of Stress : Not on file  Social Connections:   . Frequency of Communication with Friends and Family: Not on file  . Frequency of Social Gatherings with Friends and Family: Not on file  . Attends Religious Services: Not on file  . Active Member of Clubs or Organizations: Not on file  . Attends Banker Meetings: Not on file  . Marital Status: Not on file   Additional Social History:                         Sleep: Fair  Appetite:  Good  Current Medications: Current Facility-Administered Medications  Medication Dose Route Frequency Provider Last Rate Last Admin  . acetaminophen (TYLENOL) tablet 650 mg  650 mg Oral Q6H PRN Denzil Magnuson, NP    650 mg at 02/28/20 0024  . alum & mag hydroxide-simeth (MAALOX/MYLANTA) 200-200-20 MG/5ML suspension 30 mL  30 mL Oral Q4H PRN Denzil Magnuson, NP   30 mL at 02/26/20 0441  . benztropine (COGENTIN) tablet 2 mg  2 mg Oral BID PRN Mariel Craft, MD   2 mg at 02/28/20 0024   Or  . benztropine mesylate (COGENTIN) injection 2 mg  2 mg Intramuscular BID PRN Mariel Craft, MD      . haloperidol (HALDOL) tablet 10 mg  10 mg Oral Q6H PRN Mariel Craft, MD   10 mg at 02/28/20 0024   Or  . haloperidol lactate (HALDOL) injection 10 mg  10 mg Intramuscular Q6H PRN Mariel Craft, MD      .  haloperidol (HALDOL) tablet 10 mg  10 mg Oral NOW Antonieta Pert, MD      . hydrOXYzine (ATARAX/VISTARIL) tablet 25 mg  25 mg Oral Q6H PRN Mariel Craft, MD   25 mg at 02/28/20 0025  . magnesium hydroxide (MILK OF MAGNESIA) suspension 30 mL  30 mL Oral Daily PRN Denzil Magnuson, NP      . nicotine (NICODERM CQ - dosed in mg/24 hours) patch 21 mg  21 mg Transdermal Daily Mariel Craft, MD   21 mg at 02/28/20 0800  . [START ON 03/24/2020] paliperidone (INVEGA SUSTENNA) injection 234 mg  234 mg Intramuscular Q28 days Mariel Craft, MD      . paliperidone (INVEGA) 24 hr tablet 12 mg  12 mg Oral QHS Antonieta Pert, MD      . zolpidem [facility]) tablet 5 mg  5 mg Oral QHS PRN Mariel Craft, MD   5 mg at 02/27/20 2037    Lab Results: No results found for this or any previous visit (from the past 48 hour(s)).  Blood Alcohol level:  Lab Results  Component Value Date   ETH <10 02/23/2020   ETH <10 02/01/2020    Metabolic Disorder Labs: Lab Results  Component Value Date   HGBA1C 5.5 02/03/2020   MPG 111.15 02/03/2020   MPG 102.54 06/01/2019   Lab Results  Component Value Date   PROLACTIN 8.1 06/01/2019   Lab Results  Component Value Date   CHOL 172 02/03/2020   TRIG 202 (H) 02/03/2020   HDL 35 (L) 02/03/2020   CHOLHDL 4.9 02/03/2020   VLDL 40 02/03/2020   LDLCALC 97 02/03/2020    LDLCALC 138 (H) 06/01/2019    Physical Findings: AIMS: Facial and Oral Movements Muscles of Facial Expression: None, normal Lips and Perioral Area: None, normal Jaw: None, normal Tongue: None, normal,Extremity Movements Upper (arms, wrists, hands, fingers): None, normal Lower (legs, knees, ankles, toes): None, normal, Trunk Movements Neck, shoulders, hips: None, normal, Overall Severity Severity of abnormal movements (highest score from questions above): None, normal Incapacitation due to abnormal movements: None, normal Patient's awareness of abnormal movements (rate only patient's report): No Awareness, Dental Status Current problems with teeth and/or dentures?: No Does patient usually wear dentures?: No  CIWA:  CIWA-Ar Total: 0 COWS:  COWS Total Score: 1  Musculoskeletal: Strength & Muscle Tone: within normal limits Gait & Station: normal Patient leans: N/A  Psychiatric Specialty Exam: Physical Exam Vitals and nursing note reviewed.  Constitutional:      Appearance: Normal appearance.  HENT:     Head: Normocephalic and atraumatic.  Pulmonary:     Effort: Pulmonary effort is normal.  Neurological:     General: No focal deficit present.     Mental Status: He is alert and oriented to person, place, and time.     Review of Systems  Blood pressure 126/87, pulse (!) 109, temperature 98.7 F (37.1 C), temperature source Oral, resp. rate 20, height 6\' 1"  (1.854 m), weight 125.6 kg, SpO2 99 %.Body mass index is 36.55 kg/m.  General Appearance: Casual  Eye Contact:  Fair  Speech:  Pressured  Volume:  Increased  Mood:  Euphoric  Affect:  Labile  Thought Process:  Disorganized and Descriptions of Associations: Tangential  Orientation:  Negative  Thought Content:  Delusions, Paranoid Ideation and Tangential  Suicidal Thoughts:  No  Homicidal Thoughts:  No  Memory:  Immediate;   Poor Recent;   Poor Remote;   Poor  Judgement:  Impaired  Insight:  Lacking  Psychomotor  Activity:  Increased  Concentration:  Concentration: Poor and Attention Span: Poor  Recall:  Poor  Fund of Knowledge:  Fair  Language:  Fair  Akathisia:  Negative  Handed:  Right  AIMS (if indicated):     Assets:  Desire for Improvement Resilience  ADL's:  Intact  Cognition:  WNL  Sleep:  Number of Hours: 5.25     Treatment Plan Summary: Daily contact with patient to assess and evaluate symptoms and progress in treatment, Medication management and Plan : Patient is seen and examined.  Patient is a 44 year old male with the above-stated past psychiatric history who is seen in follow-up.   Diagnosis: 1.  Schizoaffective disorder; bipolar type 2.  Probable Trileptal overdose  Pertinent findings on examination today: 1.  Patient remains tangential, delusional and disorganized. 2.  Sleep is fair 3.  Vital signs are stable.  Plan: 1.  Increase Invega to 12 mg p.o. nightly for psychosis and mood stability. 2.  Patient received the long-acting Invega injection 234 mg on 03/24/2020. 3.  Haloperidol 10 mg p.o. x1 now. 4.  Continue haloperidol 10 mg p.o. every 6 hours as needed agitation. 5.  Continue Cogentin 2 mg p.o. or IM q. twice daily as needed tremor. 6.  Patient may need mood stability medication, but with his overdose options are limited.  Hopefully we can control his psychosis with mood stabilizing antipsychotics. 7.  Disposition planning-mother prefers that he find a new apartment rather than the one that he currently owns for safety reasons.  Antonieta Pert, MD 02/28/2020, 11:41 AM

## 2020-02-28 NOTE — Progress Notes (Signed)
Patient ID: [name], male   DOB: December 31, 1975, 44 y.o.   MRN: [id number] Patient paranoid, suspicious, observed talking to himself, came to get night meds and stated: "no one needs to know of the world but me", amongst other illogical statements. Pt was medicated with Ambien 5mg  for sleep, but that was not effective, pt sat on the bench in his room, pointing to his room mate who was sleeping and stating: "He does what he wants to do", then stated that he could not lie on the bed because his back was painful. Pt was medicated at 0024 with Tylenol 650mg  for back pain, Haldol 10mg  for Psychosis and Vistaril 25mg  for anxiety. Med was effective for 2 hours, as pt slept deeply, snoring during this time, and woke up again and required multiple positive reinforcements to go back to bed, but had multiple moments moments of being awake. Pt was maintained on Q15 minute checks for safety through shift.

## 2020-02-28 NOTE — Plan of Care (Signed)
  Problem: Education: Goal: Emotional status will improve Outcome: Progressing   Problem: Education: Goal: Mental status will improve Outcome: Not Progressing

## 2020-02-28 NOTE — BHH Counselor (Signed)
CSW left voicemail for this patients ACT Team through [facility] in [place].    Ruthann Cancer MSW, LCSW Clincal Social Worker  [facility]

## 2020-02-28 NOTE — [facility] Group Notes (Signed)
The focus of this group is to help patients establish daily goals to achieve during treatment and discuss how the patient can incorporate goal setting into their daily lives to aide in recovery.  Pt stated that his goal was to go home. Pt was coached on what a reasonable goal is and he did not change his goal. No insight into purpose of group.

## 2020-02-29 DIAGNOSIS — F200 Paranoid schizophrenia: Secondary | ICD-10-CM | POA: Diagnosis not present

## 2020-02-29 MED ORDER — FLUPHENAZINE HCL 5 MG PO TABS
5.0000 mg | ORAL_TABLET | Freq: Three times a day (TID) | ORAL | Status: DC | PRN
  Administered 2020-03-02: 5 mg via ORAL
  Filled 2020-02-29: qty 1

## 2020-02-29 MED ORDER — GABAPENTIN 800 MG PO TABS
800.0000 mg | ORAL_TABLET | Freq: Three times a day (TID) | ORAL | Status: DC
  Administered 2020-02-29 – 2020-03-01 (×6): 800 mg via ORAL
  Filled 2020-02-29 (×10): qty 1

## 2020-02-29 MED ORDER — BENZTROPINE MESYLATE 1 MG PO TABS
1.0000 mg | ORAL_TABLET | Freq: Two times a day (BID) | ORAL | Status: DC
  Administered 2020-02-29 – 2020-03-06 (×13): 1 mg via ORAL
  Filled 2020-02-29 (×16): qty 1

## 2020-02-29 MED ORDER — FLUPHENAZINE HCL 5 MG PO TABS
5.0000 mg | ORAL_TABLET | Freq: Every day | ORAL | Status: DC
  Administered 2020-02-29 – 2020-03-01 (×2): 5 mg via ORAL
  Filled 2020-02-29 (×3): qty 1

## 2020-02-29 MED ORDER — FLUPHENAZINE HCL 10 MG PO TABS
10.0000 mg | ORAL_TABLET | Freq: Every day | ORAL | Status: DC
  Administered 2020-02-29: 10 mg via ORAL
  Filled 2020-02-29 (×2): qty 1

## 2020-02-29 NOTE — Progress Notes (Signed)
°   02/29/20 1000  Psych Admission Type (Psych Patients Only)  Admission Status Involuntary  Psychosocial Assessment  Patient Complaints Anxiety  Eye Contact Fair  Facial Expression Animated  Affect Anxious  Speech Pressured  Interaction Assertive  Motor Activity Restless  Appearance/Hygiene Unremarkable  Behavior Characteristics Cooperative  Mood Preoccupied  Thought Process  Coherency Incoherent  Content Preoccupation  Delusions None reported or observed  Perception Hallucinations  Hallucination None reported or observed  Judgment Limited  Confusion None  Danger to Self  Current suicidal ideation? Denies  Danger to Others  Danger to Others None reported or observed   Pt observed whispering and talking to himself. He is tangential at times making references to superman. Pt is taking medications as ordered. Offered support, encouragement and 15 minute checks. Pt denies si and hi. Safety maintained on the unit.

## 2020-02-29 NOTE — Progress Notes (Signed)
Pt continues to come out his room talking to people not seen by staff, pt continues to pace like he did when he was on [address]

## 2020-02-29 NOTE — Progress Notes (Signed)
[facility]  02/29/2020 12:47 PM [name]  MRN:  [id number] Subjective:  Patient is a 44 year old male with a past psychiatric history significant for schizophrenia versus schizoaffective disorder who originally [facility] on 02/23/2020 after he had been found unresponsive.  The patient had apparently been noncompliant with his psychiatric medications, but had taken an overdose of an unspecified medication.  On arrival EMS stated the patient had been unresponsive but was protecting his airway.  Objective: Patient is seen and examined. Patient is a 44 year old male with the above-stated past psychiatric history who is seen in follow-up. He is slightly less pressured and tangential today. He still psychotic. He still occasionally is responding to internal stimuli. He only slept 4.5 hours last night. The addition of the gabapentin does seem to have slowed him down. The 12 mg of paliperidone at bedtime really have not affected his sleep. We discussed adding Prolixin back on top of the paliperidone. He had been previously treated with Prolixin as well as Abilify and other antipsychotics in the past. His vital signs are stable, he is afebrile. As per above he slept about 4 hours last night. No new laboratories.  Principal Problem: Schizophrenia (HCC) Diagnosis: Principal Problem:   Schizophrenia (HCC) Active Problems:   Tobacco use disorder   Overdose  Total Time spent with patient: 20 minutes  Past Psychiatric History: See admission H&P  Past Medical History:  Past Medical History:  Diagnosis Date  . Bipolar disorder (HCC)   . Bronchitis   . Hyperlipidemia   . Paranoid schizophrenia (HCC)   . Schizophrenia [facility](HCC)     Past Surgical History:  Procedure Laterality Date  . TESTICLE IMPLANTATION TO THIGH    . TESTICLE SURGERY     Family History:  Family History  Problem Relation Age of Onset  . Schizophrenia Neg Hx    Family Psychiatric   History: See admission H&P Social History:  Social History   Substance and Sexual Activity  Alcohol Use Not Currently   Comment: weekly     Social History   Substance and Sexual Activity  Drug Use Not Currently  . Types: Marijuana    Social History   Socioeconomic History  . Marital status: Single    Spouse name: Not on file  . Number of children: Not on file  . Years of education: 12 years  . Highest education level: 12th grade  Occupational History  . Occupation: On disability  Tobacco Use  . Smoking status: Current Every Day Smoker    Packs/day: 1.00    Types: Cigarettes  . Smokeless tobacco: Never Used  Vaping Use  . Vaping Use: Never used  Substance and Sexual Activity  . Alcohol use: Not Currently    Comment: weekly  . Drug use: Not Currently    Types: Marijuana  . Sexual activity: Yes    Birth control/protection: Condom  Other Topics Concern  . Not on file  Social History Narrative   Pt lives alone in apartment complex for mentally ill   Social Determinants of Health   Financial Resource Strain:   . Difficulty of Paying Living Expenses: Not on file  Food Insecurity:   . Worried About Programme researcher, broadcasting/film/videounning Out of Food in the Last Year: Not on file  . Ran Out of Food in the Last Year: Not on file  Transportation Needs:   . Lack of Transportation (Medical): Not on file  . Lack of Transportation (Non-Medical): Not on file  Physical Activity:   . Days of Exercise per Week: Not on file  . Minutes of Exercise per Session: Not on file  Stress:   . Feeling of Stress : Not on file  Social Connections:   . Frequency of Communication with Friends and Family: Not on file  . Frequency of Social Gatherings with Friends and Family: Not on file  . Attends Religious Services: Not on file  . Active Member of Clubs or Organizations: Not on file  . Attends Banker Meetings: Not on file  . Marital Status: Not on file   Additional Social History:                          Sleep: Poor  Appetite:  Good  Current Medications: Current Facility-Administered Medications  Medication Dose Route Frequency Provider Last Rate Last Admin  . acetaminophen (TYLENOL) tablet 650 mg  650 mg Oral Q6H PRN Denzil Magnuson, NP   650 mg at 02/29/20 0823  . alum & mag hydroxide-simeth (MAALOX/MYLANTA) 200-200-20 MG/5ML suspension 30 mL  30 mL Oral Q4H PRN Denzil Magnuson, NP   30 mL at 02/26/20 0441  . benztropine (COGENTIN) tablet 1 mg  1 mg Oral BID Antonieta Pert, MD   1 mg at 02/29/20 0825  . benztropine (COGENTIN) tablet 2 mg  2 mg Oral BID PRN Mariel Craft, MD   2 mg at 02/29/20 0222   Or  . benztropine mesylate (COGENTIN) injection 2 mg  2 mg Intramuscular BID PRN Mariel Craft, MD      . fluPHENAZine (PROLIXIN) tablet 10 mg  10 mg Oral QHS Antonieta Pert, MD      . fluPHENAZine (PROLIXIN) tablet 5 mg  5 mg Oral Daily Antonieta Pert, MD   5 mg at 02/29/20 0824  . fluPHENAZine (PROLIXIN) tablet 5 mg  5 mg Oral TID PRN Antonieta Pert, MD      . gabapentin (NEURONTIN) tablet 800 mg  800 mg Oral TID Antonieta Pert, MD   800 mg at 02/29/20 1218  . hydrOXYzine (ATARAX/VISTARIL) tablet 25 mg  25 mg Oral Q6H PRN Mariel Craft, MD   25 mg at 02/29/20 0222  . magnesium hydroxide (MILK OF MAGNESIA) suspension 30 mL  30 mL Oral Daily PRN Denzil Magnuson, NP      . nicotine (NICODERM CQ - dosed in mg/24 hours) patch 21 mg  21 mg Transdermal Daily Mariel Craft, MD   21 mg at 02/29/20 0820  . [START ON 03/24/2020] paliperidone (INVEGA SUSTENNA) injection 234 mg  234 mg Intramuscular Q28 days Mariel Craft, MD      . paliperidone (INVEGA) 24 hr tablet 12 mg  12 mg Oral QHS Antonieta Pert, MD   12 mg at 02/28/20 2042  . ziprasidone (GEODON) injection 20 mg  20 mg Intramuscular Q6H PRN Antonieta Pert, MD      . zolpidem [facility]) tablet 5 mg  5 mg Oral QHS PRN Mariel Craft, MD   5 mg at 02/28/20 2042    Lab Results: No results  found for this or any previous visit (from the past 48 hour(s)).  Blood Alcohol level:  Lab Results  Component Value Date   [facility] <10 02/23/2020   ETH <10 02/01/2020    Metabolic Disorder Labs: Lab Results  Component Value Date   HGBA1C 5.5 02/03/2020   MPG 111.15 02/03/2020   MPG 102.54  06/01/2019   Lab Results  Component Value Date   PROLACTIN 8.1 06/01/2019   Lab Results  Component Value Date   CHOL 172 02/03/2020   TRIG 202 (H) 02/03/2020   HDL 35 (L) 02/03/2020   CHOLHDL 4.9 02/03/2020   VLDL 40 02/03/2020   LDLCALC 97 02/03/2020   LDLCALC 138 (H) 06/01/2019    Physical Findings: AIMS: Facial and Oral Movements Muscles of Facial Expression: None, normal Lips and Perioral Area: None, normal Jaw: None, normal Tongue: None, normal,Extremity Movements Upper (arms, wrists, hands, fingers): None, normal Lower (legs, knees, ankles, toes): None, normal, Trunk Movements Neck, shoulders, hips: None, normal, Overall Severity Severity of abnormal movements (highest score from questions above): None, normal Incapacitation due to abnormal movements: None, normal Patient's awareness of abnormal movements (rate only patient's report): No Awareness, Dental Status Current problems with teeth and/or dentures?: No Does patient usually wear dentures?: No  CIWA:  CIWA-Ar Total: 0 COWS:  COWS Total Score: 1  Musculoskeletal: Strength & Muscle Tone: within normal limits Gait & Station: normal Patient leans: N/A  Psychiatric Specialty Exam: Physical Exam Vitals and nursing note reviewed.  Constitutional:      Appearance: Normal appearance.  HENT:     Head: Normocephalic and atraumatic.  Pulmonary:     Effort: Pulmonary effort is normal.  Neurological:     General: No focal deficit present.     Mental Status: He is alert and oriented to person, place, and time.     Review of Systems  Blood pressure 121/81, pulse 100, temperature 98.3 F (36.8 C), temperature source Oral,  resp. rate 20, height 6\' 1"  (1.854 m), weight 125.6 kg, SpO2 100 %.Body mass index is 36.55 kg/m.  General Appearance: Casual  Eye Contact:  Fair  Speech:  Pressured  Volume:  Increased  Mood:  Euphoric  Affect:  Labile  Thought Process:  Disorganized and Descriptions of Associations: Tangential  Orientation:  Full (Time, Place, and Person)  Thought Content:  Delusions, Hallucinations: Auditory, Paranoid Ideation, Rumination and Tangential  Suicidal Thoughts:  No  Homicidal Thoughts:  No  Memory:  Immediate;   Poor Recent;   Poor Remote;   Poor  Judgement:  Impaired  Insight:  Lacking  Psychomotor Activity:  Increased  Concentration:  Concentration: Poor and Attention Span: Poor  Recall:  Poor  Fund of Knowledge:  Fair  Language:  Good  Akathisia:  Negative  Handed:  Right  AIMS (if indicated):     Assets:  Desire for Improvement Resilience Social Support  ADL's:  Intact  Cognition:  WNL  Sleep:  Number of Hours: 4     Treatment Plan Summary: Daily contact with patient to assess and evaluate symptoms and progress in treatment, Medication management and Plan : Patient is seen and examined. Patient is a 44 year old male with the above-stated past psychiatric history who is seen in follow-up.   Diagnosis: 1.  Schizoaffective disorder; bipolar type 2.  Probable Trileptal overdose  Pertinent findings on examination today: 1. Patient remains tangential, delusional and disorganized. He is slightly improved today. 2. Sleep is poor. 3. Vital signs are stable.  Plan: 1. Continue Invega to 12 mg p.o. nightly for psychosis and mood stability. 2.  Patient received the long-acting Invega injection 234 mg on 03/24/2020. 3.   Start Prolixin 5 mg p.o. daily and 10 mg p.o. q. nightly for psychosis and mood stability 4.   Stop Haldol 5.  Continue Cogentin 2 mg p.o. or IM q. twice daily  as needed tremor. 6. Gabapentin was started yesterday at 400 mg p.o. 3 times daily, and this will  be increased to 800 mg p.o. 3 times daily for mood stability and anxiety. 7. Stop Ambien 8. Disposition planning-placement in progress.  Antonieta Pert, MD 02/29/2020, 12:47 PM

## 2020-02-29 NOTE — Progress Notes (Signed)
Pt continues to pace and is delusional and disorganized at times.     02/29/20 2200  Psych Admission Type (Psych Patients Only)  Admission Status Involuntary  Psychosocial Assessment  Patient Complaints Anxiety  Eye Contact Fair  Facial Expression Animated  Affect Anxious  Speech Pressured  Interaction Assertive  Motor Activity Restless;Pacing  Appearance/Hygiene Unremarkable  Behavior Characteristics Cooperative  Mood Pleasant;Preoccupied  Thought Process  Coherency Incoherent  Content Preoccupation  Delusions None reported or observed  Perception Hallucinations  Hallucination None reported or observed  Judgment Limited  Confusion None  Danger to Self  Current suicidal ideation? Denies  Danger to Others  Danger to Others None reported or observed

## 2020-02-29 NOTE — Progress Notes (Signed)
Pt continues to be up multiple times through the night cutting the lights on and talking to himself

## 2020-03-01 DIAGNOSIS — F200 Paranoid schizophrenia: Secondary | ICD-10-CM | POA: Diagnosis not present

## 2020-03-01 MED ORDER — FLUPHENAZINE HCL 5 MG PO TABS
15.0000 mg | ORAL_TABLET | Freq: Every day | ORAL | Status: DC
  Administered 2020-03-01: 15 mg via ORAL
  Filled 2020-03-01 (×2): qty 3

## 2020-03-01 MED ORDER — FLUPHENAZINE HCL 5 MG PO TABS
10.0000 mg | ORAL_TABLET | Freq: Every day | ORAL | Status: DC
  Administered 2020-03-02 – 2020-03-06 (×5): 10 mg via ORAL
  Filled 2020-03-01 (×7): qty 2

## 2020-03-01 MED ORDER — PALIPERIDONE ER 3 MG PO TB24
9.0000 mg | ORAL_TABLET | Freq: Every day | ORAL | Status: DC
  Administered 2020-03-01: 9 mg via ORAL
  Filled 2020-03-01 (×2): qty 3

## 2020-03-01 NOTE — Progress Notes (Signed)
Pt continues to be disorganized and pace, but pleasant    03/01/20 2200  Psych Admission Type (Psych Patients Only)  Admission Status Involuntary  Psychosocial Assessment  Patient Complaints Anxiety;Suspiciousness  Eye Contact Fair  Facial Expression Animated;Anxious;Pensive  Affect Anxious;Preoccupied  Speech Pressured;Tangential;Rhyming  Interaction Assertive  Motor Activity Pacing;Restless  Appearance/Hygiene Unremarkable  Behavior Characteristics Cooperative;Fidgety  Mood Anxious;Suspicious;Preoccupied  Thought Chartered certified accountant of ideas;Loose associations;Tangential  Content Obsessions;Preoccupation  Delusions None reported or observed  Perception Hallucinations  Hallucination Auditory  Judgment Limited  Confusion None  Danger to Self  Current suicidal ideation? Denies  Danger to Others  Danger to Others None reported or observed

## 2020-03-01 NOTE — Tx Team (Signed)
Interdisciplinary Treatment and Diagnostic Plan Update  03/01/2020 Time of Session: 1000am [name] MRN: [id number]  Principal Diagnosis: Schizophrenia [facility])  Secondary Diagnoses: Principal Problem:   Schizophrenia (HCC) Active Problems:   Tobacco use disorder   Overdose   Current Medications:  Current Facility-Administered Medications  Medication Dose Route Frequency Provider Last Rate Last Admin  . acetaminophen (TYLENOL) tablet 650 mg  650 mg Oral Q6H PRN Denzil Magnuson, NP   650 mg at 02/29/20 0823  . alum & mag hydroxide-simeth (MAALOX/MYLANTA) 200-200-20 MG/5ML suspension 30 mL  30 mL Oral Q4H PRN Denzil Magnuson, NP   30 mL at 02/26/20 0441  . benztropine (COGENTIN) tablet 1 mg  1 mg Oral BID Antonieta Pert, MD   1 mg at 03/01/20 0733  . benztropine (COGENTIN) tablet 2 mg  2 mg Oral BID PRN Mariel Craft, MD   2 mg at 02/29/20 2110   Or  . benztropine mesylate (COGENTIN) injection 2 mg  2 mg Intramuscular BID PRN Mariel Craft, MD      . Melene Muller ON 03/02/2020] fluPHENAZine (PROLIXIN) tablet 10 mg  10 mg Oral Daily Antonieta Pert, MD      . fluPHENAZine (PROLIXIN) tablet 15 mg  15 mg Oral QHS Antonieta Pert, MD      . fluPHENAZine (PROLIXIN) tablet 5 mg  5 mg Oral TID PRN Antonieta Pert, MD      . gabapentin (NEURONTIN) tablet 800 mg  800 mg Oral TID Antonieta Pert, MD   800 mg at 03/01/20 0734  . hydrOXYzine (ATARAX/VISTARIL) tablet 25 mg  25 mg Oral Q6H PRN Mariel Craft, MD   25 mg at 02/29/20 0222  . magnesium hydroxide (MILK OF MAGNESIA) suspension 30 mL  30 mL Oral Daily PRN Denzil Magnuson, NP      . nicotine (NICODERM CQ - dosed in mg/24 hours) patch 21 mg  21 mg Transdermal Daily Mariel Craft, MD   21 mg at 03/01/20 0733  . [START ON 03/24/2020] paliperidone (INVEGA SUSTENNA) injection 234 mg  234 mg Intramuscular Q28 days Mariel Craft, MD      . paliperidone (INVEGA) 24 hr tablet 9 mg  9 mg Oral QHS Antonieta Pert, MD       . ziprasidone (GEODON) injection 20 mg  20 mg Intramuscular Q6H PRN Antonieta Pert, MD       PTA Medications: No medications prior to admission.    Patient Stressors: Marital or family conflict Medication change or noncompliance  Patient Strengths: Geographical information systems officer for treatment/growth  Treatment Modalities: Medication Management, Group therapy, Case management,  1 to 1 session with clinician, Psychoeducation, Recreational therapy.   Physician Treatment Plan for Primary Diagnosis: Schizophrenia (HCC) Long Term Goal(s): Improvement in symptoms so as ready for discharge Improvement in symptoms so as ready for discharge   Short Term Goals: Ability to identify changes in lifestyle to reduce recurrence of condition will improve Ability to verbalize feelings will improve Ability to demonstrate self-control will improve Ability to identify and develop effective coping behaviors will improve Ability to maintain clinical measurements within normal limits will improve Compliance with prescribed medications will improve Ability to identify changes in lifestyle to reduce recurrence of condition will improve Ability to verbalize feelings will improve Ability to demonstrate self-control will improve Ability to identify and develop effective coping behaviors will improve Ability to maintain clinical measurements within normal limits will improve Compliance with prescribed medications will  improve  Medication Management: Evaluate patient's response, side effects, and tolerance of medication regimen.  Therapeutic Interventions: 1 to 1 sessions, Unit Group sessions and Medication administration.  Evaluation of Outcomes: Progressing  Physician Treatment Plan for Secondary Diagnosis: Principal Problem:   Schizophrenia (HCC) Active Problems:   Tobacco use disorder   Overdose  Long Term Goal(s): Improvement in symptoms so as ready for discharge Improvement in  symptoms so as ready for discharge   Short Term Goals: Ability to identify changes in lifestyle to reduce recurrence of condition will improve Ability to verbalize feelings will improve Ability to demonstrate self-control will improve Ability to identify and develop effective coping behaviors will improve Ability to maintain clinical measurements within normal limits will improve Compliance with prescribed medications will improve Ability to identify changes in lifestyle to reduce recurrence of condition will improve Ability to verbalize feelings will improve Ability to demonstrate self-control will improve Ability to identify and develop effective coping behaviors will improve Ability to maintain clinical measurements within normal limits will improve Compliance with prescribed medications will improve     Medication Management: Evaluate patient's response, side effects, and tolerance of medication regimen.  Therapeutic Interventions: 1 to 1 sessions, Unit Group sessions and Medication administration.  Evaluation of Outcomes: Progressing   RN Treatment Plan for Primary Diagnosis: Schizophrenia (HCC) Long Term Goal(s): Knowledge of disease and therapeutic regimen to maintain health will improve  Short Term Goals: Ability to demonstrate self-control, Ability to verbalize feelings will improve and Ability to identify and develop effective coping behaviors will improve  Medication Management: RN will administer medications as ordered by provider, will assess and evaluate patient's response and provide education to patient for prescribed medication. RN will report any adverse and/or side effects to prescribing provider.  Therapeutic Interventions: 1 on 1 counseling sessions, Psychoeducation, Medication administration, Evaluate responses to treatment, Monitor vital signs and CBGs as ordered, Perform/monitor CIWA, COWS, AIMS and Fall Risk screenings as ordered, Perform wound care treatments as  ordered.  Evaluation of Outcomes: Progressing   LCSW Treatment Plan for Primary Diagnosis: Schizophrenia (HCC) Long Term Goal(s): Safe transition to appropriate next level of care at discharge, Engage patient in therapeutic group addressing interpersonal concerns.  Short Term Goals: Engage patient in aftercare planning with referrals and resources, Increase ability to appropriately verbalize feelings, Facilitate patient progression through stages of change regarding substance use diagnoses and concerns and Identify triggers associated with mental health/substance abuse issues  Therapeutic Interventions: Assess for all discharge needs, 1 to 1 time with Social worker, Explore available resources and support systems, Assess for adequacy in community support network, Educate family and significant other(s) on suicide prevention, Complete Psychosocial Assessment, Interpersonal group therapy.  Evaluation of Outcomes: Progressing   Progress in Treatment: Attending groups: Yes. Participating in groups: Yes. Taking medication as prescribed: Yes. Toleration medication: Yes. Family/Significant other contact made: Yes, individual(s) contacted:  with mother Patient understands diagnosis: Yes. Discussing patient identified problems/goals with staff: Yes. Medical problems stabilized or resolved: Yes. Denies suicidal/homicidal ideation: Yes. Issues/concerns per patient self-inventory: No.   New problem(s) identified: No, Describe:  none  New Short Term/Long Term Goal(s):medication stabilization, elimination of SI thoughts, development of comprehensive mental wellness plan.  Patient Goals:  "to grow my hair back out thick"  Discharge Plan or Barriers: Patient to follow up with ACTT post discharge  Reason for Continuation of Hospitalization: Depression Medication stabilization  Estimated Length of Stay: 3-5 days  Attendees: Patient:  03/01/2020   Physician:  03/01/2020   Nursing:  03/01/2020    RN Care Manager: 03/01/2020   Social Worker: Ruthann Cancer, LCSW 03/01/2020  Recreational Therapist:  03/01/2020  Other:  03/01/2020   Other:  03/01/2020   Other: 03/01/2020     Scribe for Treatment Team: Otelia Santee, LCSW 03/01/2020 10:44 AM

## 2020-03-01 NOTE — Plan of Care (Signed)
Progress note  D: pt found in the hallway pacing; compliant with medication administration. Pt denies any physical complaints or pain. Pt continues to be tangential and responding to internal stimuli. Pt states they argue with the voices to make them go away. Pt educated on not becoming too animated on the hallway to disturb other pt's. Pt verbalizes understanding. Pt is animated and pleasant. Pt denies si/hi/ah/vh and verbally agrees to approach staff if these become apparent or before harming themself/others while at [facility].  A: Pt provided support and encouragement. Pt given medication per protocol and standing orders. Q38m safety checks implemented and continued.  R: Pt safe on the unit. Will continue to monitor.  Pt progressing in the following metrics  Problem: Activity: Goal: Interest or engagement in activities will improve Outcome: Progressing Goal: Sleeping patterns will improve Outcome: Progressing   Problem: Coping: Goal: Ability to verbalize frustrations and anger appropriately will improve Outcome: Progressing

## 2020-03-01 NOTE — [facility] Group Notes (Signed)
[facility] LCSW Group Therapy    Type of Group and Topic: Psychoeducational Group: Discharge Planning  Participation Level: Active  Description of Group  Discharge planning group reviews patient's anticipated discharge plans and assists patients to anticipate and address any barriers to wellness/recovery in the community. Suicide prevention education is reviewed with patients in group.  Therapeutic Goals  1. Patients will state their anticipated discharge plan and mental health aftercare  2. Patients will identify potential barriers to wellness in the community setting  3. Patients will engage in problem solving, solution focused discussion of ways to anticipate and address barriers to wellness/recovery  Summary of Patient Progress: Patient attended and participated in group. This patient was disorganized and often off topic, however was pleasant and able to interact with peers.   Plan for Discharge/Comments: Plans to return to live in his apartment and to eventually move to more supportive living with assistance from his ACT Team.  Transportation Means: Mother or father  Supports: Mother and father  Therapeutic Modalities: Motivational Interviewing  Ruthann Cancer MSW, LCSW Clincal Social Worker  [facility]

## 2020-03-01 NOTE — Progress Notes (Signed)
[facility] MD Progress Note  03/01/2020 11:10 AM [name]  MRN:  [id number] Subjective:  Patient is a 44 year old male with a past psychiatric history significant for schizophrenia versus schizoaffective disorder who originally presented to the [facility] emergency department on 02/23/2020 after he had been found unresponsive. The patient had apparently been noncompliant with his psychiatric medications, but had taken an overdose of an unspecified medication. On arrival EMS stated the patient had been unresponsive but was protecting his airway.  Objective: Patient is seen and examined.  Patient is a 44 year old male with the above-stated past psychiatric history who is seen in follow-up.  He remains significantly intrusive and disorganized.  His speech rate has slowed down a bit.  He still quite delusional and stating that what he really needs is "an letter that says I can get a job".  Today is the first day that he stated he wanted to go back to his apartment, but I told him that we were attempting to get him into a new residence.  He is still pacing a great deal.  His blood pressure this morning is 138/83.  Pulse was 99.  He is afebrile.  His sleep is still not great at 4.25 hours even after the addition of the Prolixin.  No new laboratories.  Principal Problem: Schizophrenia (HCC) Diagnosis: Principal Problem:   Schizophrenia (HCC) Active Problems:   Tobacco use disorder   Overdose  Total Time spent with patient: 20 minutes  Past Psychiatric History: See admission H&P  Past Medical History:  Past Medical History:  Diagnosis Date  . Bipolar disorder (HCC)   . Bronchitis   . Hyperlipidemia   . Paranoid schizophrenia (HCC)   . Schizophrenia [facility])     Past Surgical History:  Procedure Laterality Date  . TESTICLE IMPLANTATION TO THIGH    . TESTICLE SURGERY     Family History:  Family History  Problem Relation Age of Onset  . Schizophrenia Neg Hx    Family Psychiatric  History: See  admission H&P Social History:  Social History   Substance and Sexual Activity  Alcohol Use Not Currently   Comment: weekly     Social History   Substance and Sexual Activity  Drug Use Not Currently  . Types: Marijuana    Social History   Socioeconomic History  . Marital status: Single    Spouse name: Not on file  . Number of children: Not on file  . Years of education: 12 years  . Highest education level: 12th grade  Occupational History  . Occupation: On disability  Tobacco Use  . Smoking status: Current Every Day Smoker    Packs/day: 1.00    Types: Cigarettes  . Smokeless tobacco: Never Used  Vaping Use  . Vaping Use: Never used  Substance and Sexual Activity  . Alcohol use: Not Currently    Comment: weekly  . Drug use: Not Currently    Types: Marijuana  . Sexual activity: Yes    Birth control/protection: Condom  Other Topics Concern  . Not on file  Social History Narrative   Pt lives alone in apartment complex for mentally ill   Social Determinants of Health   Financial Resource Strain:   . Difficulty of Paying Living Expenses: Not on file  Food Insecurity:   . Worried About Programme researcher, broadcasting/film/video in the Last Year: Not on file  . Ran Out of Food in the Last Year: Not on file  Transportation Needs:   . Lack  of Transportation (Medical): Not on file  . Lack of Transportation (Non-Medical): Not on file  Physical Activity:   . Days of Exercise per Week: Not on file  . Minutes of Exercise per Session: Not on file  Stress:   . Feeling of Stress : Not on file  Social Connections:   . Frequency of Communication with Friends and Family: Not on file  . Frequency of Social Gatherings with Friends and Family: Not on file  . Attends Religious Services: Not on file  . Active Member of Clubs or Organizations: Not on file  . Attends Banker Meetings: Not on file  . Marital Status: Not on file   Additional Social History:                          Sleep: Fair  Appetite:  Good  Current Medications: Current Facility-Administered Medications  Medication Dose Route Frequency Provider Last Rate Last Admin  . acetaminophen (TYLENOL) tablet 650 mg  650 mg Oral Q6H PRN Denzil Magnuson, NP   650 mg at 02/29/20 0823  . alum & mag hydroxide-simeth (MAALOX/MYLANTA) 200-200-20 MG/5ML suspension 30 mL  30 mL Oral Q4H PRN Denzil Magnuson, NP   30 mL at 02/26/20 0441  . benztropine (COGENTIN) tablet 1 mg  1 mg Oral BID Antonieta Pert, MD   1 mg at 03/01/20 0733  . benztropine (COGENTIN) tablet 2 mg  2 mg Oral BID PRN Mariel Craft, MD   2 mg at 02/29/20 2110   Or  . benztropine mesylate (COGENTIN) injection 2 mg  2 mg Intramuscular BID PRN Mariel Craft, MD      . Melene Muller ON 03/02/2020] fluPHENAZine (PROLIXIN) tablet 10 mg  10 mg Oral Daily Antonieta Pert, MD      . fluPHENAZine (PROLIXIN) tablet 15 mg  15 mg Oral QHS Antonieta Pert, MD      . fluPHENAZine (PROLIXIN) tablet 5 mg  5 mg Oral TID PRN Antonieta Pert, MD      . gabapentin (NEURONTIN) tablet 800 mg  800 mg Oral TID Antonieta Pert, MD   800 mg at 03/01/20 0734  . hydrOXYzine (ATARAX/VISTARIL) tablet 25 mg  25 mg Oral Q6H PRN Mariel Craft, MD   25 mg at 02/29/20 0222  . magnesium hydroxide (MILK OF MAGNESIA) suspension 30 mL  30 mL Oral Daily PRN Denzil Magnuson, NP      . nicotine (NICODERM CQ - dosed in mg/24 hours) patch 21 mg  21 mg Transdermal Daily Mariel Craft, MD   21 mg at 03/01/20 0733  . [START ON 03/24/2020] paliperidone (INVEGA SUSTENNA) injection 234 mg  234 mg Intramuscular Q28 days Mariel Craft, MD      . paliperidone (INVEGA) 24 hr tablet 9 mg  9 mg Oral QHS Antonieta Pert, MD      . ziprasidone (GEODON) injection 20 mg  20 mg Intramuscular Q6H PRN Antonieta Pert, MD        Lab Results: No results found for this or any previous visit (from the past 48 hour(s)).  Blood Alcohol level:  Lab Results  Component Value Date    [facility] <10 02/23/2020   ETH <10 02/01/2020    Metabolic Disorder Labs: Lab Results  Component Value Date   HGBA1C 5.5 02/03/2020   MPG 111.15 02/03/2020   MPG 102.54 06/01/2019   Lab Results  Component Value Date  PROLACTIN 8.1 06/01/2019   Lab Results  Component Value Date   CHOL 172 02/03/2020   TRIG 202 (H) 02/03/2020   HDL 35 (L) 02/03/2020   CHOLHDL 4.9 02/03/2020   VLDL 40 02/03/2020   LDLCALC 97 02/03/2020   LDLCALC 138 (H) 06/01/2019    Physical Findings: AIMS: Facial and Oral Movements Muscles of Facial Expression: None, normal Lips and Perioral Area: None, normal Jaw: None, normal Tongue: None, normal,Extremity Movements Upper (arms, wrists, hands, fingers): None, normal Lower (legs, knees, ankles, toes): None, normal, Trunk Movements Neck, shoulders, hips: None, normal, Overall Severity Severity of abnormal movements (highest score from questions above): None, normal Incapacitation due to abnormal movements: None, normal Patient's awareness of abnormal movements (rate only patient's report): No Awareness, Dental Status Current problems with teeth and/or dentures?: No Does patient usually wear dentures?: No  CIWA:  CIWA-Ar Total: 0 COWS:  COWS Total Score: 1  Musculoskeletal: Strength & Muscle Tone: within normal limits Gait & Station: normal Patient leans: N/A  Psychiatric Specialty Exam: Physical Exam Vitals and nursing note reviewed.  Constitutional:      Appearance: Normal appearance.  HENT:     Head: Normocephalic and atraumatic.  Pulmonary:     Effort: Pulmonary effort is normal.  Neurological:     General: No focal deficit present.     Mental Status: He is alert and oriented to person, place, and time.     Review of Systems  Blood pressure 138/83, pulse 99, temperature 98 F (36.7 C), temperature source Oral, resp. rate 18, height 6\' 1"  (1.854 m), weight 125.6 kg, SpO2 99 %.Body mass index is 36.55 kg/m.  General Appearance: Casual   Eye Contact:  Fair  Speech:  Pressured  Volume:  Normal  Mood:  Anxious and Dysphoric  Affect:  Labile  Thought Process:  Disorganized and Descriptions of Associations: Loose  Orientation:  Negative  Thought Content:  Delusions, Hallucinations: Auditory, Rumination and Tangential  Suicidal Thoughts:  No  Homicidal Thoughts:  No  Memory:  Immediate;   Poor Recent;   Fair Remote;   Fair  Judgement:  Impaired  Insight:  Fair  Psychomotor Activity:  Increased  Concentration:  Concentration: Poor and Attention Span: Poor  Recall:  Poor  Fund of Knowledge:  Fair  Language:  Good  Akathisia:  Negative  Handed:  Right  AIMS (if indicated):     Assets:  Desire for Improvement Resilience  ADL's:  Intact  Cognition:  WNL  Sleep:  Number of Hours: 4.25     Treatment Plan Summary: Daily contact with patient to assess and evaluate symptoms and progress in treatment, Medication management and Plan : Patient is seen and examined.  Patient is a 44 year old male with the above-stated past psychiatric history who is seen in follow-up.   Diagnosis: 1. Schizoaffective disorder; bipolar type 2. Probable Trileptal overdose  Pertinent findings on examination today: 1. Patient remains tangential, delusional and disorganized. He is slightly improved today. 2. Sleep is poor. 3. Vital signs are stable.  Plan: 1.  Continue Cogentin 1 mg p.o. twice daily for side effects of medication. 2.  Increase fluphenazine to 10 mg p.o. daily and 15 mg p.o. nightly for psychosis. 3.  Continue gabapentin 800 mg p.o. 3 times daily for mood stability. 4.  Continue hydroxyzine 25 mg p.o. every 6 hours as needed anxiety. 5.  Patient received the paliperidone long-acting injection 234 mg on 02/22/2020.  Next injection is due on 03/24/2020. 6.  Given the increased  dosage of Prolixin we will decrease the oral paliperidone dose to 9 mg p.o. nightly for psychosis. 7.  Disposition planning-in progress.   Antonieta Pert, MD 03/01/2020, 11:10 AM

## 2020-03-02 DIAGNOSIS — F200 Paranoid schizophrenia: Secondary | ICD-10-CM | POA: Diagnosis not present

## 2020-03-02 MED ORDER — OLANZAPINE 10 MG PO TABS
20.0000 mg | ORAL_TABLET | Freq: Every day | ORAL | Status: DC
  Administered 2020-03-02: 20 mg via ORAL
  Filled 2020-03-02 (×2): qty 2

## 2020-03-02 MED ORDER — OLANZAPINE 10 MG PO TABS
10.0000 mg | ORAL_TABLET | Freq: Every day | ORAL | Status: DC
  Administered 2020-03-02 – 2020-03-03 (×2): 10 mg via ORAL
  Filled 2020-03-02 (×5): qty 1

## 2020-03-02 MED ORDER — FLUPHENAZINE HCL 10 MG PO TABS
20.0000 mg | ORAL_TABLET | Freq: Every day | ORAL | Status: DC
  Administered 2020-03-02 – 2020-03-03 (×2): 20 mg via ORAL
  Filled 2020-03-02: qty 4
  Filled 2020-03-02 (×4): qty 2

## 2020-03-02 NOTE — Progress Notes (Signed)
Recreation Therapy Notes  Date:  11.4.21 Time: 1000 Location: [address] Dayroom  Group Topic: Triggers  Goal Area(s) Addresses:  Patient will identify things that trigger them. Patient will identify coping skills to dealing with triggers.  Behavioral Response: Minimal  Intervention: Worksheet, Music  Activity: Triggers.  Patients were to identify any emotion, person, situation, place, etc that triggers them.  Patients would then identify how they avoid their triggers and how they deal with them head on when they are unavoidable.  LRT would also play appropriate music requested by patients during group session.    Education:  Stress Management, Discharge Planning.   Education Outcome: Acknowledges Education  Clinical Observations/Feedback: Pt appeared to be attentive to peers as they spoke.  Pt was unable to identify triggers.  Pt rambled on about music and how he can't be mastered because music is always changing therefore he is always changing.  Pt was able to sit appropriately during group session.   Caroll Rancher, LRT/CTRS         Caroll Rancher A 03/02/2020 11:50 AM

## 2020-03-02 NOTE — Progress Notes (Signed)
[facility] MD Progress Note  03/02/2020 12:07 PM DEMETRES [name]  MRN:  [id number] Subjective:  Patient is a 44 year old male with a past psychiatric history significant for schizophrenia versus schizoaffective disorder who originally presented to the [facility] emergency department on 02/23/2020 after he had been found unresponsive. The patient had apparently been noncompliant with his psychiatric medications, but had taken an overdose of an unspecified medication. On arrival EMS stated the patient had been unresponsive but was protecting his airway.  Objective: Patient is seen and examined. Patient is a 44 year old male with the above-stated past psychiatric history who is seen in follow-up. He is essentially unchanged from yesterday. His sleep is still poor. He remains intrusive at times and is well disorganized. Unfortunately it does not appear as though the gabapentin nor the paliperidone is really doing anything. Review of the electronic medical record revealed some evidence that perhaps Zyprexa augmentation may be beneficial in addition to the Prolixin. Unfortunately given his overdose of the Trileptal I am concerned about putting a medication like Depakote, Tegretol or Trileptal on board for mood stability. His vital signs are stable, he is afebrile. He only slept 4.75 hours last night. No new laboratories.  Principal Problem: Schizophrenia (HCC) Diagnosis: Principal Problem:   Schizophrenia (HCC) Active Problems:   Tobacco use disorder   Overdose  Total Time spent with patient: 20 minutes  Past Psychiatric History: See admission H&P  Past Medical History:  Past Medical History:  Diagnosis Date  . Bipolar disorder (HCC)   . Bronchitis   . Hyperlipidemia   . Paranoid schizophrenia (HCC)   . Schizophrenia [facility])     Past Surgical History:  Procedure Laterality Date  . TESTICLE IMPLANTATION TO THIGH    . TESTICLE SURGERY     Family History:  Family History  Problem Relation Age of  Onset  . Schizophrenia Neg Hx    Family Psychiatric  History: See admission H&P Social History:  Social History   Substance and Sexual Activity  Alcohol Use Not Currently   Comment: weekly     Social History   Substance and Sexual Activity  Drug Use Not Currently  . Types: Marijuana    Social History   Socioeconomic History  . Marital status: Single    Spouse name: Not on file  . Number of children: Not on file  . Years of education: 12 years  . Highest education level: 12th grade  Occupational History  . Occupation: On disability  Tobacco Use  . Smoking status: Current Every Day Smoker    Packs/day: 1.00    Types: Cigarettes  . Smokeless tobacco: Never Used  Vaping Use  . Vaping Use: Never used  Substance and Sexual Activity  . Alcohol use: Not Currently    Comment: weekly  . Drug use: Not Currently    Types: Marijuana  . Sexual activity: Yes    Birth control/protection: Condom  Other Topics Concern  . Not on file  Social History Narrative   Pt lives alone in apartment complex for mentally ill   Social Determinants of Health   Financial Resource Strain:   . Difficulty of Paying Living Expenses: Not on file  Food Insecurity:   . Worried About Programme researcher, broadcasting/film/video in the Last Year: Not on file  . Ran Out of Food in the Last Year: Not on file  Transportation Needs:   . Lack of Transportation (Medical): Not on file  . Lack of Transportation (Non-Medical): Not on file  Physical  Activity:   . Days of Exercise per Week: Not on file  . Minutes of Exercise per Session: Not on file  Stress:   . Feeling of Stress : Not on file  Social Connections:   . Frequency of Communication with Friends and Family: Not on file  . Frequency of Social Gatherings with Friends and Family: Not on file  . Attends Religious Services: Not on file  . Active Member of Clubs or Organizations: Not on file  . Attends Banker Meetings: Not on file  . Marital Status: Not on  file   Additional Social History:                         Sleep: Fair  Appetite:  Good  Current Medications: Current Facility-Administered Medications  Medication Dose Route Frequency Provider Last Rate Last Admin  . acetaminophen (TYLENOL) tablet 650 mg  650 mg Oral Q6H PRN Denzil Magnuson, NP   650 mg at 02/29/20 0823  . alum & mag hydroxide-simeth (MAALOX/MYLANTA) 200-200-20 MG/5ML suspension 30 mL  30 mL Oral Q4H PRN Denzil Magnuson, NP   30 mL at 02/26/20 0441  . benztropine (COGENTIN) tablet 1 mg  1 mg Oral BID Antonieta Pert, MD   1 mg at 03/02/20 0827  . benztropine (COGENTIN) tablet 2 mg  2 mg Oral BID PRN Mariel Craft, MD   2 mg at 03/02/20 0109   Or  . benztropine mesylate (COGENTIN) injection 2 mg  2 mg Intramuscular BID PRN Mariel Craft, MD      . fluPHENAZine (PROLIXIN) tablet 10 mg  10 mg Oral Daily Antonieta Pert, MD   10 mg at 03/02/20 0826  . fluPHENAZine (PROLIXIN) tablet 20 mg  20 mg Oral QHS Antonieta Pert, MD      . fluPHENAZine (PROLIXIN) tablet 5 mg  5 mg Oral TID PRN Antonieta Pert, MD      . hydrOXYzine (ATARAX/VISTARIL) tablet 25 mg  25 mg Oral Q6H PRN Mariel Craft, MD   25 mg at 03/02/20 0109  . magnesium hydroxide (MILK OF MAGNESIA) suspension 30 mL  30 mL Oral Daily PRN Denzil Magnuson, NP      . nicotine (NICODERM CQ - dosed in mg/24 hours) patch 21 mg  21 mg Transdermal Daily Mariel Craft, MD   21 mg at 03/02/20 0827  . OLANZapine (ZYPREXA) tablet 10 mg  10 mg Oral Daily Antonieta Pert, MD   10 mg at 03/02/20 0828  . OLANZapine (ZYPREXA) tablet 20 mg  20 mg Oral QHS Antonieta Pert, MD      . Melene Muller ON 03/24/2020] paliperidone (INVEGA SUSTENNA) injection 234 mg  234 mg Intramuscular Q28 days Mariel Craft, MD      . ziprasidone (GEODON) injection 20 mg  20 mg Intramuscular Q6H PRN Antonieta Pert, MD        Lab Results: No results found for this or any previous visit (from the past 48  hour(s)).  Blood Alcohol level:  Lab Results  Component Value Date   [facility] <10 02/23/2020   ETH <10 02/01/2020    Metabolic Disorder Labs: Lab Results  Component Value Date   HGBA1C 5.5 02/03/2020   MPG 111.15 02/03/2020   MPG 102.54 06/01/2019   Lab Results  Component Value Date   PROLACTIN 8.1 06/01/2019   Lab Results  Component Value Date   CHOL 172 02/03/2020   TRIG  202 (H) 02/03/2020   HDL 35 (L) 02/03/2020   CHOLHDL 4.9 02/03/2020   VLDL 40 02/03/2020   LDLCALC 97 02/03/2020   LDLCALC 138 (H) 06/01/2019    Physical Findings: AIMS: Facial and Oral Movements Muscles of Facial Expression: None, normal Lips and Perioral Area: None, normal Jaw: None, normal Tongue: None, normal,Extremity Movements Upper (arms, wrists, hands, fingers): None, normal Lower (legs, knees, ankles, toes): None, normal, Trunk Movements Neck, shoulders, hips: None, normal, Overall Severity Severity of abnormal movements (highest score from questions above): None, normal Incapacitation due to abnormal movements: None, normal Patient's awareness of abnormal movements (rate only patient's report): No Awareness, Dental Status Current problems with teeth and/or dentures?: No Does patient usually wear dentures?: No  CIWA:  CIWA-Ar Total: 0 COWS:  COWS Total Score: 1  Musculoskeletal: Strength & Muscle Tone: within normal limits Gait & Station: normal Patient leans: N/A  Psychiatric Specialty Exam: Physical Exam Vitals and nursing note reviewed.  Constitutional:      Appearance: Normal appearance.  HENT:     Head: Normocephalic and atraumatic.  Pulmonary:     Effort: Pulmonary effort is normal.  Neurological:     General: No focal deficit present.     Mental Status: He is alert.     Review of Systems  Blood pressure 138/86, pulse 90, temperature 97.9 F (36.6 C), temperature source Oral, resp. rate 18, height 6\' 1"  (1.854 m), weight 125.6 kg, SpO2 99 %.Body mass index is 36.55  kg/m.  General Appearance: Casual  Eye Contact:  Good  Speech:  Pressured  Volume:  Normal  Mood:  Euphoric  Affect:  Labile  Thought Process:  Disorganized and Descriptions of Associations: Tangential  Orientation:  Full (Time, Place, and Person)  Thought Content:  Delusions, Paranoid Ideation, Rumination and Tangential  Suicidal Thoughts:  No  Homicidal Thoughts:  No  Memory:  Immediate;   Fair Recent;   Fair Remote;   Fair  Judgement:  Impaired  Insight:  Fair  Psychomotor Activity:  Increased  Concentration:  Concentration: Fair and Attention Span: Poor  Recall:  Poor  Fund of Knowledge:  Fair  Language:  Good  Akathisia:  Negative  Handed:  Right  AIMS (if indicated):     Assets:  Desire for Improvement Resilience Social Support  ADL's:  Intact  Cognition:  WNL  Sleep:  Number of Hours: 4.75     Treatment Plan Summary: Daily contact with patient to assess and evaluate symptoms and progress in treatment, Medication management and Plan : Patient is seen and examined. Patient is a 44 year old male with the above-stated past psychiatric history who is seen in follow-up.   Diagnosis: 1. Schizoaffective disorder; bipolar type 2. Probable Trileptal overdose  Pertinent findings on examination today: 1. Patient remains tangential, delusional and disorganized. He is slightly improved today. 2. Sleep is poor. 3. Vital signs are stable.  Plan: 1. Failure of gabapentin and oral paliperidone to affect current situation. 2. Stop paliperidone. 3. Stop gabapentin. 4. Start olanzapine 10 mg p.o. daily and 20 mg p.o. nightly for psychosis and mood stability. 5. Continue fluphenazine 10 mg p.o. daily and 20 mg p.o. nightly for psychosis. 6. Continue Cogentin 1 mg p.o. twice daily for side effects of Prolixin. 7. Patient received the long-acting paliperidone injection 234 mg last on 02/22/2020. 8. We continue to seek alternative housing per his family's request. 9.  Disposition planning-in progress. 02/24/2020, MD 03/02/2020, 12:07 PM

## 2020-03-02 NOTE — [facility] Group Notes (Signed)
Occupational Therapy Group Note Date: 03/02/2020 Group Topic/Focus: Relaxation  Group Description: Group encouraged increased engagement and participation through a guided imagery mindfulness practice to promote relaxation and symptom management. Patients were encouraged to share their own relaxation strategies/practices and were then encouraged to follow along with the guided relaxation stretches/exercises provided. Patients reported benefit and more relaxed post group.   Therapeutic Goal(s): Identify personal relaxation strategies and techniques to reduce stress and emotional distress Participation Level: Minimal   Participation Quality: Maximum Cues   Behavior: Guarded, Oppositional and Restless   Speech/Thought Process: Distracted   Affect/Mood: Irritable   Insight: Limited   Judgement: Limited   Individualization: [name] accepted invite to attend group, however while present, declined to engage in exercises and stretches presented. Pt identified relaxing by "doing whatever I want because I am a free man and I live on my own." When prompted to identify specific activities, pt unable, stating "Whatever I want." Pt encouraged to engage in group exercises, however declined, asking if he could do push ups. Pt left group abruptly and stated he was going to do push ups in his room instead. Pt did not return and when this writer followed up, pt stated he did not do any push ups but instead "laid down to rest."  Modes of Intervention: Activity, Discussion and Education  Patient Response to Interventions:  Resistant   Plan: Continue to engage patient in OT groups 2 - 3x/week.  03/02/2020  Donne Hazel, MOT, OTR/L

## 2020-03-02 NOTE — Progress Notes (Signed)
Pt has been up pacing in his room much of the night, talking to people not seen by staff, pt going in and out of the bathroom constantly much of the evening.

## 2020-03-02 NOTE — Progress Notes (Signed)
Pt appeared to be responding to internal stimuli less than last night.      03/02/20 2200  Psych Admission Type (Psych Patients Only)  Admission Status Involuntary  Psychosocial Assessment  Patient Complaints Anxiety;Suspiciousness  Eye Contact Fair  Facial Expression Animated;Anxious;Pensive  Affect Anxious;Preoccupied  Speech Pressured;Tangential;Rhyming  Interaction Assertive  Motor Activity Pacing;Restless  Appearance/Hygiene Unremarkable  Behavior Characteristics Cooperative  Mood Preoccupied  Thought Chartered certified accountant of ideas;Loose associations;Tangential  Content Obsessions;Preoccupation  Delusions None reported or observed  Perception Hallucinations  Hallucination Auditory  Judgment Limited  Confusion None  Danger to Self  Current suicidal ideation? Denies  Danger to Others  Danger to Others None reported or observed

## 2020-03-02 NOTE — Progress Notes (Addendum)
[place] NOVEL CORONAVIRUS (COVID-19) DAILY CHECK-OFF SYMPTOMS - answer yes or no to each - every day NO YES  Have you had a fever in the past 24 hours?  . Fever (Temp > 37.80C / 100F) X   Have you had any of these symptoms in the past 24 hours? . New Cough .  Sore Throat  .  Shortness of Breath .  Difficulty Breathing .  Unexplained Body Aches   X   Have you had any one of these symptoms in the past 24 hours not related to allergies?   . Runny Nose .  Nasal Congestion .  Sneezing   X   If you have had runny nose, nasal congestion, sneezing in the past 24 hours, has it worsened?  X   EXPOSURES - check yes or no X   Have you traveled outside the state in the past 14 days?  X   Have you been in contact with someone with a confirmed diagnosis of COVID-19 or PUI in the past 14 days without wearing appropriate PPE?  X   Have you been living in the same home as a person with confirmed diagnosis of COVID-19 or a PUI (household contact)?    X   Have you been diagnosed with COVID-19?    X              What to do next: Answered NO to all: Answered YES to anything:   Proceed with unit schedule Follow the [facility] Inpatient Flowsheet.   Pt alert and oriented to self and place. Confused, preoccupied with tangential speech and is irritable at this time. Presented with bright affect at the beginning of this shift at medication window stating "Tell the doctor he got me on the right medicines. I want all of those medicines in my bubble pack when I leaver here". Observed pacing hall at this moment, responding to internal stimuli "I'm talking to my president, [name]. He's the president of the [place], I voted for him. I need to leave right now because you all are stealing my insurance money and making me fat in here. Attended groups as scheduled when prompted. Remains medication compliant. Denies discomfort in reference to his medications. Denies SI, HI, AVH and pain when assessed.  Support and  encouragement offered to pt throughout this shift. Scheduled and PRN (Prolixin & Vistaril at 1405) medications administered with verbal education and effects monitored.Q 15 minutes safety checks maintained.  Pt continue to need verbal redirections at intervals for intrusive behavior and irritability but remains verbally redirectable. Tolerates all PO intake and medications well. Safety maintained on and off unit.

## 2020-03-02 NOTE — Progress Notes (Signed)
Adult Psychoeducational Group Note  Date:  03/02/2020 Time:  1:55 AM  Group Topic/Focus:  Wrap-Up Group:   The focus of this group is to help patients review their daily goal of treatment and discuss progress on daily workbooks.  Participation Level:  Did Not Attend  Participation Quality:  Did Not Attend  Affect:  Did Not Attend  Cognitive:  Did Not Attend  Insight: None  Engagement in Group:  Did Not Attend  Modes of Intervention:  Did Not Attend  Additional Comments:  Pt did not attend evening wrap up group tonight.  Felipa Furnace 03/02/2020, 1:55 AM

## 2020-03-03 DIAGNOSIS — F200 Paranoid schizophrenia: Secondary | ICD-10-CM | POA: Diagnosis not present

## 2020-03-03 MED ORDER — OLANZAPINE 5 MG PO TABS
5.0000 mg | ORAL_TABLET | Freq: Every day | ORAL | Status: DC
  Administered 2020-03-04 – 2020-03-06 (×3): 5 mg via ORAL
  Filled 2020-03-03 (×4): qty 1

## 2020-03-03 MED ORDER — OLANZAPINE 10 MG PO TABS
30.0000 mg | ORAL_TABLET | Freq: Every day | ORAL | Status: DC
  Administered 2020-03-03 – 2020-03-05 (×3): 30 mg via ORAL
  Filled 2020-03-03 (×5): qty 3

## 2020-03-03 NOTE — [facility] Group Notes (Signed)
Patient was in group but was falling asleep  Goal for the day was to not have any loud outburst

## 2020-03-03 NOTE — Progress Notes (Signed)
SPIRITUALITY GROUP NOTE  Pt attended spirituality group facilitated by Wilkie Aye, MDIv, [facility].  Group Description:  Group focused on topic of hope.  Patients participated in facilitated discussion around topic, connecting with one another around experiences and definitions for hope.  Group members engaged with visual explorer photos, reflecting on what hope looks like for them today.  Group engaged in discussion around how their definitions of hope are present today in hospital.   Modalities: Psycho-social ed, Adlerian, Narrative, MI Patient Progress: [name] was present throughout group.  Asleep through group, he awoke at end and engaged off topic.

## 2020-03-03 NOTE — [facility] Group Notes (Signed)
[facility] LCSW Group Therapy  03/03/2020 11:57 AM  Type of Therapy:  Group Therapy  Participation Level:  Active  Participation Quality:  Inattentive and Sharing  Affect:  Appropriate  Cognitive:  Disorganized  Insight:  Developing/Improving  Engagement in Therapy:  Improving  Modes of Intervention:  Discussion  Summary of Progress/Problems: This patient attended and participated in group. He shared that he feels safe when their are security camera's around.   Kadee Philyaw A Alexanderjames Berg 03/03/2020, 11:57 AM

## 2020-03-03 NOTE — Plan of Care (Signed)
Nurse discussed anxiety, depression and hopeless with patient.  

## 2020-03-03 NOTE — Progress Notes (Signed)
[facility] MD Progress Note  03/03/2020 11:22 AM [name]  MRN:  [id number] Subjective: Patient is a 44 year old male with a past psychiatric history significant for schizophrenia versus schizoaffective disorder who originally presented to the [facility] emergency department on 02/23/2020 after he had been found unresponsive. The patient had apparently been noncompliant with his psychiatric medications, but had taken an overdose of an unspecified medication. On arrival EMS stated the patient had been unresponsive but was protecting his airway.  Objective: Patient is seen and examined.  Patient is a 44 year old male with the above-stated past psychiatric history who is seen in follow-up.  It does look like he is finally improving a bit.  He was much less intrusive today, and did fall asleep during group.  Yesterday the paliperidone and gabapentin were both stopped.  He was started on olanzapine 10 mg p.o. daily and 20 mg p.o. nightly.  He continues on fluphenazine 10 mg p.o. daily and 20 mg p.o. nightly.  His vital signs are stable, he is afebrile.  His sleep is still poor at 3.25 hours last night.  No new laboratories.  His vital signs are stable, he is afebrile.  He denied auditory or visual hallucinations.  He denied any suicidal or homicidal ideation.  He still disorganized at times and grandiose.  Having previously taken care of him in the past this is near his baseline.   Principal Problem: Schizophrenia (HCC) Diagnosis: Principal Problem:   Schizophrenia (HCC) Active Problems:   Tobacco use disorder   Overdose  Total Time spent with patient: 20 minutes  Past Psychiatric History: See admission H&P  Past Medical History:  Past Medical History:  Diagnosis Date  . Bipolar disorder (HCC)   . Bronchitis   . Hyperlipidemia   . Paranoid schizophrenia (HCC)   . Schizophrenia [facility])     Past Surgical History:  Procedure Laterality Date  . TESTICLE IMPLANTATION TO THIGH    . TESTICLE SURGERY      Family History:  Family History  Problem Relation Age of Onset  . Schizophrenia Neg Hx    Family Psychiatric  History: See admission H&P Social History:  Social History   Substance and Sexual Activity  Alcohol Use Not Currently   Comment: weekly     Social History   Substance and Sexual Activity  Drug Use Not Currently  . Types: Marijuana    Social History   Socioeconomic History  . Marital status: Single    Spouse name: Not on file  . Number of children: Not on file  . Years of education: 12 years  . Highest education level: 12th grade  Occupational History  . Occupation: On disability  Tobacco Use  . Smoking status: Current Every Day Smoker    Packs/day: 1.00    Types: Cigarettes  . Smokeless tobacco: Never Used  Vaping Use  . Vaping Use: Never used  Substance and Sexual Activity  . Alcohol use: Not Currently    Comment: weekly  . Drug use: Not Currently    Types: Marijuana  . Sexual activity: Yes    Birth control/protection: Condom  Other Topics Concern  . Not on file  Social History Narrative   Pt lives alone in apartment complex for mentally ill   Social Determinants of Health   Financial Resource Strain:   . Difficulty of Paying Living Expenses: Not on file  Food Insecurity:   . Worried About Programme researcher, broadcasting/film/video in the Last Year: Not on file  .  Ran Out of Food in the Last Year: Not on file  Transportation Needs:   . Lack of Transportation (Medical): Not on file  . Lack of Transportation (Non-Medical): Not on file  Physical Activity:   . Days of Exercise per Week: Not on file  . Minutes of Exercise per Session: Not on file  Stress:   . Feeling of Stress : Not on file  Social Connections:   . Frequency of Communication with Friends and Family: Not on file  . Frequency of Social Gatherings with Friends and Family: Not on file  . Attends Religious Services: Not on file  . Active Member of Clubs or Organizations: Not on file  . Attends Occupational hygienistClub  or Organization Meetings: Not on file  . Marital Status: Not on file   Additional Social History:                         Sleep: Poor  Appetite:  Good  Current Medications: Current Facility-Administered Medications  Medication Dose Route Frequency Provider Last Rate Last Admin  . acetaminophen (TYLENOL) tablet 650 mg  650 mg Oral Q6H PRN Denzil Magnusonhomas, Lashunda, NP   650 mg at 03/03/20 0808  . alum & mag hydroxide-simeth (MAALOX/MYLANTA) 200-200-20 MG/5ML suspension 30 mL  30 mL Oral Q4H PRN Denzil Magnusonhomas, Lashunda, NP   30 mL at 02/26/20 0441  . benztropine (COGENTIN) tablet 1 mg  1 mg Oral BID Antonieta Pertlary, Dago Jungwirth Lawson, MD   1 mg at 03/03/20 0805  . benztropine (COGENTIN) tablet 2 mg  2 mg Oral BID PRN Mariel CraftMaurer, [name] M, MD   2 mg at 03/02/20 2138   Or  . benztropine mesylate (COGENTIN) injection 2 mg  2 mg Intramuscular BID PRN Mariel CraftMaurer, [name] M, MD      . fluPHENAZine (PROLIXIN) tablet 10 mg  10 mg Oral Daily Antonieta Pertlary, Janelys Glassner Lawson, MD   10 mg at 03/03/20 0805  . fluPHENAZine (PROLIXIN) tablet 20 mg  20 mg Oral QHS Antonieta Pertlary, Veatrice Eckstein Lawson, MD   20 mg at 03/02/20 2139  . fluPHENAZine (PROLIXIN) tablet 5 mg  5 mg Oral TID PRN Antonieta Pertlary, Vedanth Sirico Lawson, MD   5 mg at 03/02/20 1405  . hydrOXYzine (ATARAX/VISTARIL) tablet 25 mg  25 mg Oral Q6H PRN Mariel CraftMaurer, [name] M, MD   25 mg at 03/02/20 2139  . magnesium hydroxide (MILK OF MAGNESIA) suspension 30 mL  30 mL Oral Daily PRN Denzil Magnusonhomas, Lashunda, NP      . nicotine (NICODERM CQ - dosed in mg/24 hours) patch 21 mg  21 mg Transdermal Daily Mariel CraftMaurer, [name] M, MD   21 mg at 03/03/20 0807  . OLANZapine (ZYPREXA) tablet 10 mg  10 mg Oral Daily Antonieta Pertlary, Majorie Santee Lawson, MD   10 mg at 03/03/20 0805  . OLANZapine (ZYPREXA) tablet 30 mg  30 mg Oral QHS Antonieta Pertlary, Jene Oravec Lawson, MD      . Melene Muller[START ON 03/24/2020] paliperidone (INVEGA SUSTENNA) injection 234 mg  234 mg Intramuscular Q28 days Mariel CraftMaurer, [name] M, MD      . ziprasidone (GEODON) injection 20 mg  20 mg Intramuscular Q6H PRN Antonieta Pertlary, Deklen Popelka Lawson,  MD        Lab Results: No results found for this or any previous visit (from the past 48 hour(s)).  Blood Alcohol level:  Lab Results  Component Value Date   [facility]   ETH <10 02/01/2020    Metabolic Disorder Labs: Lab Results  Component Value Date   HGBA1C  5.5 02/03/2020   MPG 111.15 02/03/2020   MPG 102.54 06/01/2019   Lab Results  Component Value Date   PROLACTIN 8.1 06/01/2019   Lab Results  Component Value Date   CHOL 172 02/03/2020   TRIG 202 (H) 02/03/2020   HDL 35 (L) 02/03/2020   CHOLHDL 4.9 02/03/2020   VLDL 40 02/03/2020   LDLCALC 97 02/03/2020   LDLCALC 138 (H) 06/01/2019    Physical Findings: AIMS: Facial and Oral Movements Muscles of Facial Expression: None, normal Lips and Perioral Area: None, normal Jaw: None, normal Tongue: None, normal,Extremity Movements Upper (arms, wrists, hands, fingers): None, normal Lower (legs, knees, ankles, toes): None, normal, Trunk Movements Neck, shoulders, hips: None, normal, Overall Severity Severity of abnormal movements (highest score from questions above): None, normal Incapacitation due to abnormal movements: None, normal Patient's awareness of abnormal movements (rate only patient's report): No Awareness, Dental Status Current problems with teeth and/or dentures?: No Does patient usually wear dentures?: No  CIWA:  CIWA-Ar Total: 0 COWS:  COWS Total Score: 1  Musculoskeletal: Strength & Muscle Tone: within normal limits Gait & Station: normal Patient leans: N/A  Psychiatric Specialty Exam: Physical Exam Vitals and nursing note reviewed.  HENT:     Head: Normocephalic and atraumatic.  Pulmonary:     Effort: Pulmonary effort is normal.  Neurological:     General: No focal deficit present.     Mental Status: He is alert and oriented to person, place, and time.     Review of Systems  Blood pressure 126/81, pulse 88, temperature 98 F (36.7 C), temperature source Oral, resp. rate 18,  height 6\' 1"  (1.854 m), weight 125.6 kg, SpO2 100 %.Body mass index is 36.55 kg/m.  General Appearance: Casual  Eye Contact:  Fair  Speech:  Pressured  Volume:  Normal  Mood:  Euphoric  Affect:  Constricted  Thought Process:  Disorganized and Descriptions of Associations: Tangential  Orientation:  Negative  Thought Content:  Delusions, Paranoid Ideation and Rumination  Suicidal Thoughts:  No  Homicidal Thoughts:  No  Memory:  Immediate;   Poor Recent;   Poor Remote;   Poor  Judgement:  Intact  Insight:  Fair  Psychomotor Activity:  Increased  Concentration:  Concentration: Fair and Attention Span: Fair  Recall:  of Knowledge:  Poor  Language:  Good  Akathisia:  Negative  Handed:  Right  AIMS (if indicated):     Assets:  Desire for Improvement Housing Resilience  ADL's:  Intact  Cognition:  WNL  Sleep:  Number of Hours: 3.25     Treatment Plan Summary: Daily contact with patient to assess and evaluate symptoms and progress in treatment, Medication management and Plan : Patient is seen and examined.  Patient is a 44 year old male with the above-stated past psychiatric history who is seen in follow-up.  Diagnosis: 1. Schizoaffective disorder; bipolar type 2. Probable Trileptal overdose  Pertinent findings on examination today: 1.  Decrease in his delusional and tangential discussion.  Still mildly disorganized.  This is actually improved from admission. 2.  Sleep remains poor. 3.  Vital signs are stable. 4.  Social work discussed with me today the fact that he is going to return to his previous apartment.  Plan: 1.  Decrease olanzapine daytime to 5 mg p.o. daily and increase nightly dosage to 30 mg for psychosis and mood stability.  Given his previous overdose of mood stability medication I am uncomfortable with going down that route although I  believe it would be helpful. 2.  Continue fluphenazine 10 mg p.o. daily and 20 mg p.o. nightly for psychosis. 4.   Continue Cogentin 1 mg p.o. twice daily for side effects of Prolixin. 5.  Patient received the long-acting paliperidone injection 234 mg last on 02/22/2020. 6.  Patient will return to his previous apartment. 7.  I have recommended to social work contacting the Department of Social Services after his discharge to evaluate the safety of his current living circumstances given the fact that he does not have a guardian. 8.  Disposition planning-plan will be if everything goes well over the weekend for discharge on Monday 10/8 so that his ACTT service can assist in picking up his medicines and making sure that he is monitored more closely than on his last hospitalization.  Antonieta Pert, MD 03/03/2020, 11:22 AM

## 2020-03-03 NOTE — Progress Notes (Signed)
D:  Patient's self inventory sheet, patient sleeps good, no sleep medication.  Good appetite, normal energy level, good concentration.  Denied depression, hopeless and anxiety.  Denied withdrawals.  Denied SI.  Denied physical problems.  Denied physical pain.  Goal is "stop talking out loud at the hospital.  Just think to my self."  No discharge plans. A:  Medications administered per MD orders.  Emotional support and encouragement given patient. R:  Denied SI and HI, contracts for safety.  Denied A/V hallucinations.  Safety maintained with 15 minute checks.

## 2020-03-04 DIAGNOSIS — F200 Paranoid schizophrenia: Secondary | ICD-10-CM | POA: Diagnosis not present

## 2020-03-04 MED ORDER — WHITE PETROLATUM EX OINT
TOPICAL_OINTMENT | CUTANEOUS | Status: AC
  Filled 2020-03-04: qty 5

## 2020-03-04 MED ORDER — FLUPHENAZINE HCL 10 MG PO TABS
25.0000 mg | ORAL_TABLET | Freq: Every day | ORAL | Status: DC
  Administered 2020-03-04 – 2020-03-05 (×2): 25 mg via ORAL
  Filled 2020-03-04 (×3): qty 3

## 2020-03-04 NOTE — Progress Notes (Signed)
[facility] MD Progress Note  03/04/2020 11:34 AM [name]  MRN:  [id number] Subjective:  Patient is a 44 year old male with a past psychiatric history significant for schizophrenia versus schizoaffective disorder who originally presented to the [facility] emergency department on 02/23/2020 after he had been found unresponsive. The patient had apparently been noncompliant with his psychiatric medications, but had taken an overdose of an unspecified medication. On arrival EMS stated the patient had been unresponsive but was protecting his airway.  Objective: Patient is seen and examined.  Patient is a 44 year old male with the above-stated past psychiatric history who is seen in follow-up.  He is essentially unchanged from yesterday.  He denied any auditory or visual hallucinations.  He still can be intrusive, and does ramble in his speech.  Having admitted him before this is near his baseline.  He did sleep better last night.  Nursing notes reflect he slept 6 hours last night.  No new laboratories.  His vital signs are stable, he is afebrile.  Principal Problem: Schizophrenia (HCC) Diagnosis: Principal Problem:   Schizophrenia (HCC) Active Problems:   Tobacco use disorder   Overdose  Total Time spent with patient: 20 minutes  Past Psychiatric History: See admission H&P  Past Medical History:  Past Medical History:  Diagnosis Date  . Bipolar disorder (HCC)   . Bronchitis   . Hyperlipidemia   . Paranoid schizophrenia (HCC)   . Schizophrenia [facility])     Past Surgical History:  Procedure Laterality Date  . TESTICLE IMPLANTATION TO THIGH    . TESTICLE SURGERY     Family History:  Family History  Problem Relation Age of Onset  . Schizophrenia Neg Hx    Family Psychiatric  History: See admission H&P Social History:  Social History   Substance and Sexual Activity  Alcohol Use Not Currently   Comment: weekly     Social History   Substance and Sexual Activity  Drug Use Not  Currently  . Types: Marijuana    Social History   Socioeconomic History  . Marital status: Single    Spouse name: Not on file  . Number of children: Not on file  . Years of education: 12 years  . Highest education level: 12th grade  Occupational History  . Occupation: On disability  Tobacco Use  . Smoking status: Current Every Day Smoker    Packs/day: 1.00    Types: Cigarettes  . Smokeless tobacco: Never Used  Vaping Use  . Vaping Use: Never used  Substance and Sexual Activity  . Alcohol use: Not Currently    Comment: weekly  . Drug use: Not Currently    Types: Marijuana  . Sexual activity: Yes    Birth control/protection: Condom  Other Topics Concern  . Not on file  Social History Narrative   Pt lives alone in apartment complex for mentally ill   Social Determinants of Health   Financial Resource Strain:   . Difficulty of Paying Living Expenses: Not on file  Food Insecurity:   . Worried About Programme researcher, broadcasting/film/video in the Last Year: Not on file  . Ran Out of Food in the Last Year: Not on file  Transportation Needs:   . Lack of Transportation (Medical): Not on file  . Lack of Transportation (Non-Medical): Not on file  Physical Activity:   . Days of Exercise per Week: Not on file  . Minutes of Exercise per Session: Not on file  Stress:   . Feeling of Stress :  Not on file  Social Connections:   . Frequency of Communication with Friends and Family: Not on file  . Frequency of Social Gatherings with Friends and Family: Not on file  . Attends Religious Services: Not on file  . Active Member of Clubs or Organizations: Not on file  . Attends Banker Meetings: Not on file  . Marital Status: Not on file   Additional Social History:                         Sleep: Good  Appetite:  Good  Current Medications: Current Facility-Administered Medications  Medication Dose Route Frequency Provider Last Rate Last Admin  . acetaminophen (TYLENOL) tablet  650 mg  650 mg Oral Q6H PRN Denzil Magnuson, NP   650 mg at 03/04/20 0832  . alum & mag hydroxide-simeth (MAALOX/MYLANTA) 200-200-20 MG/5ML suspension 30 mL  30 mL Oral Q4H PRN Denzil Magnuson, NP   30 mL at 02/26/20 0441  . benztropine (COGENTIN) tablet 1 mg  1 mg Oral BID Antonieta Pert, MD   1 mg at 03/04/20 0813  . benztropine (COGENTIN) tablet 2 mg  2 mg Oral BID PRN Mariel Craft, MD   2 mg at 03/03/20 2049   Or  . benztropine mesylate (COGENTIN) injection 2 mg  2 mg Intramuscular BID PRN Mariel Craft, MD      . fluPHENAZine (PROLIXIN) tablet 10 mg  10 mg Oral Daily Antonieta Pert, MD   10 mg at 03/04/20 0813  . fluPHENAZine (PROLIXIN) tablet 25 mg  25 mg Oral QHS Antonieta Pert, MD      . fluPHENAZine (PROLIXIN) tablet 5 mg  5 mg Oral TID PRN Antonieta Pert, MD   5 mg at 03/02/20 1405  . hydrOXYzine (ATARAX/VISTARIL) tablet 25 mg  25 mg Oral Q6H PRN Mariel Craft, MD   25 mg at 03/03/20 2047  . magnesium hydroxide (MILK OF MAGNESIA) suspension 30 mL  30 mL Oral Daily PRN Denzil Magnuson, NP   30 mL at 03/03/20 1757  . nicotine (NICODERM CQ - dosed in mg/24 hours) patch 21 mg  21 mg Transdermal Daily Mariel Craft, MD   21 mg at 03/04/20 0813  . OLANZapine (ZYPREXA) tablet 30 mg  30 mg Oral QHS Antonieta Pert, MD   30 mg at 03/03/20 2046  . OLANZapine (ZYPREXA) tablet 5 mg  5 mg Oral Daily Antonieta Pert, MD   5 mg at 03/04/20 0814  . [START ON 03/24/2020] paliperidone (INVEGA SUSTENNA) injection 234 mg  234 mg Intramuscular Q28 days Mariel Craft, MD      . ziprasidone (GEODON) injection 20 mg  20 mg Intramuscular Q6H PRN Antonieta Pert, MD        Lab Results: No results found for this or any previous visit (from the past 48 hour(s)).  Blood Alcohol level:  Lab Results  Component Value Date   ETH <10 02/23/2020   ETH <10 02/01/2020    Metabolic Disorder Labs: Lab Results  Component Value Date   HGBA1C 5.5 02/03/2020   MPG 111.15  02/03/2020   MPG 102.54 06/01/2019   Lab Results  Component Value Date   PROLACTIN 8.1 06/01/2019   Lab Results  Component Value Date   CHOL 172 02/03/2020   TRIG 202 (H) 02/03/2020   HDL 35 (L) 02/03/2020   CHOLHDL 4.9 02/03/2020   VLDL 40 02/03/2020   LDLCALC  97 02/03/2020   LDLCALC 138 (H) 06/01/2019    Physical Findings: AIMS: Facial and Oral Movements Muscles of Facial Expression: None, normal Lips and Perioral Area: None, normal Jaw: None, normal Tongue: None, normal,Extremity Movements Upper (arms, wrists, hands, fingers): None, normal Lower (legs, knees, ankles, toes): None, normal, Trunk Movements Neck, shoulders, hips: None, normal, Overall Severity Severity of abnormal movements (highest score from questions above): None, normal Incapacitation due to abnormal movements: None, normal Patient's awareness of abnormal movements (rate only patient's report): No Awareness, Dental Status Current problems with teeth and/or dentures?: No Does patient usually wear dentures?: No  CIWA:  CIWA-Ar Total: 0 COWS:  COWS Total Score: 1  Musculoskeletal: Strength & Muscle Tone: within normal limits Gait & Station: normal Patient leans: N/A  Psychiatric Specialty Exam: Physical Exam Vitals and nursing note reviewed.  Constitutional:      Appearance: Normal appearance.  HENT:     Head: Normocephalic and atraumatic.  Pulmonary:     Effort: Pulmonary effort is normal.  Neurological:     General: No focal deficit present.     Mental Status: He is alert and oriented to person, place, and time.     Review of Systems  Blood pressure (!) 121/92, pulse (!) 108, temperature 97.6 F (36.4 C), temperature source Oral, resp. rate 20, height 6\' 1"  (1.854 m), weight 125.6 kg, SpO2 100 %.Body mass index is 36.55 kg/m.  General Appearance: Casual  Eye Contact:  Good  Speech:  Pressured  Volume:  Normal  Mood:  Euthymic  Affect:  Labile  Thought Process:  Goal Directed and  Descriptions of Associations: Tangential  Orientation:  Full (Time, Place, and Person)  Thought Content:  Delusions, Paranoid Ideation and Tangential  Suicidal Thoughts:  No  Homicidal Thoughts:  No  Memory:  Immediate;   Fair Recent;   Fair Remote;   Fair  Judgement:  Intact  Insight:  Fair  Psychomotor Activity:  Increased  Concentration:  Concentration: Fair and Attention Span: Fair  Recall:  of Knowledge:  Fair  Language:  Good  Akathisia:  Negative  Handed:  Right  AIMS (if indicated):     Assets:  Desire for Improvement Housing Resilience Social Support  ADL's:  Intact  Cognition:  WNL  Sleep:  Number of Hours: 6     Treatment Plan Summary: Daily contact with patient to assess and evaluate symptoms and progress in treatment, Medication management and Plan : Patient is seen and examined.  Patient is a 44 year old male with the above-stated past psychiatric history who is seen in follow-up.   Diagnosis: 1. Schizoaffective disorder; bipolar type 2. Probable Trileptal overdose  Pertinent findings on examination today: 1.  Remains pleasantly delusional and tangential at times.  Mildly disorganized.  He is very near his baseline. 2.  Sleep is improved. 3.  Vital signs are stable. 4.  When discharge patient will most probably return to his previous apartment with ACT C better observation.  Plan: 1.  Continue Cogentin 1 mg p.o. twice daily for side effects of medication. 2.  Increase Prolixin to 10 mg p.o. daily and 25 mg p.o. nightly for mood stability, sedation and psychosis. 3.  Continue hydroxyzine 25 mg p.o. every 6 hours as needed anxiety. 4.  Continue Zyprexa 5 mg p.o. daily and 30 mg p.o. nightly for mood stability, sleep and psychosis. 5.  Patient received the long-acting paliperidone injection 234 mg last on 02/22/2020. 6.  Continue Geodon 20 mg IM every  6 hours as needed agitation. 7.  Disposition planning-in progress.  Antonieta PertGreg Lawson Sahian Kerney,  MD 03/04/2020, 11:34 AM

## 2020-03-04 NOTE — [facility] Group Notes (Signed)
[facility] LCSW Group Therapy Note  Date/Time:  03/04/2020 11:15AM-12:00PM  Type of Therapy and Topic:  Group Therapy:  Avoiding Rehospitalization  Participation Level:  Active   Description of Group This process group involved patients discussing their plans related to taking care of themselves at discharge in order to avoid, if at all possible, being rehospitalized in the future.  The group brainstormed specific ways in which they will be able to implement these plans.    Therapeutic Goals Patient will identify and describe 2-3 specific tasks they plan to undertake in order to maximize their wellness and avoid coming back to a hospital. Patient will verbalize benefits of each task described, as well as barriers to implementation. Patients will brainstorm ways to implement their plans.  Summary of Patient Progress:  The patient expressed his plans for self-care at discharge include playing PlayStation 5.  The way in which this will help is to play instead of being angry.  The patient participated somewhat with an affect that was blunted.  He was often off topic and did not make sense.  For instance, out of context he stated, "I just realized the way my apartment is made, there ain't no way anybody can die in a fire."  To direct questions, he did indicate some willingness to engage in certain actions to get and stay well including staying on the medicines that he is on here in the hospital.  Therapeutic Modalities Stages of Change theory Motivational Interviewing    Ambrose Mantle, LCSW

## 2020-03-04 NOTE — Progress Notes (Signed)
   03/04/20 1200  Psych Admission Type (Psych Patients Only)  Admission Status Involuntary  Psychosocial Assessment  Patient Complaints None  Eye Contact Fair  Facial Expression Animated;Anxious;Pensive  Affect Anxious;Preoccupied  Speech Pressured;Tangential;Rhyming  Interaction Assertive  Motor Activity Pacing;Restless  Appearance/Hygiene Unremarkable  Behavior Characteristics Cooperative;Anxious  Mood Anxious;Pleasant  Aggressive Behavior  Effect No apparent injury  Thought Chartered certified accountant of ideas;Loose associations;Tangential  Content Obsessions;Preoccupation  Delusions None reported or observed  Perception Hallucinations  Hallucination Auditory  Judgment Limited  Confusion None  Danger to Self  Current suicidal ideation? Denies  Danger to Others  Danger to Others None reported or observed

## 2020-03-04 NOTE — Progress Notes (Signed)
Adult Psychoeducational Group Note  Date:  03/04/2020 Time:  1:17 AM  Group Topic/Focus:  Wrap-Up Group:   The focus of this group is to help patients review their daily goal of treatment and discuss progress on daily workbooks.  Participation Level:  Did Not Attend  Participation Quality:  Did Not Attend  Affect:  Did Not Attend  Cognitive:  Did Not Attend  Insight: None  Engagement in Group:  Did Not Attend  Modes of Intervention:  Did Not Attend  Additional Comments:  Pt did not attend evening wrap up group tonight.  Felipa Furnace 03/04/2020, 1:17 AM

## 2020-03-04 NOTE — Progress Notes (Signed)
HedeniesSI&HI & AVH. He is cooperative with treatment.He is currently in bed resting a this time. Staff will continue to ensure safety measures.

## 2020-03-04 NOTE — Progress Notes (Signed)
Pt continues to be delusional and pace, but is pleasant.    03/04/20 2200  Psych Admission Type (Psych Patients Only)  Admission Status Involuntary  Psychosocial Assessment  Patient Complaints None  Eye Contact Fair  Facial Expression Animated;Anxious;Pensive  Affect Anxious;Preoccupied  Speech Pressured;Tangential;Rhyming  Interaction Assertive  Motor Activity Pacing;Restless  Appearance/Hygiene Unremarkable  Behavior Characteristics Cooperative  Mood Anxious;Preoccupied  Aggressive Behavior  Effect No apparent injury  Thought Chartered certified accountant of ideas;Loose associations;Tangential  Content Obsessions;Preoccupation  Delusions None reported or observed  Perception Hallucinations  Hallucination Auditory  Judgment Limited  Confusion None  Danger to Self  Current suicidal ideation? Denies  Danger to Others  Danger to Others None reported or observed

## 2020-03-04 NOTE — Progress Notes (Signed)
[place] NOVEL CORONAVIRUS (COVID-19) DAILY CHECK-OFF SYMPTOMS - answer yes or no to each - every day NO YES  Have you had a fever in the past 24 hours?  . Fever (Temp > 37.80C / 100F) X   Have you had any of these symptoms in the past 24 hours? . New Cough .  Sore Throat  .  Shortness of Breath .  Difficulty Breathing .  Unexplained Body Aches   X   Have you had any one of these symptoms in the past 24 hours not related to allergies?   . Runny Nose .  Nasal Congestion .  Sneezing   X   If you have had runny nose, nasal congestion, sneezing in the past 24 hours, has it worsened?  X   EXPOSURES - check yes or no X   Have you traveled outside the state in the past 14 days?  X   Have you been in contact with someone with a confirmed diagnosis of COVID-19 or PUI in the past 14 days without wearing appropriate PPE?  X   Have you been living in the same home as a person with confirmed diagnosis of COVID-19 or a PUI (household contact)?    X   Have you been diagnosed with COVID-19?    X              What to do next: Answered NO to all: Answered YES to anything:   Proceed with unit schedule Follow the [facility] Inpatient Flowsheet.   

## 2020-03-05 DIAGNOSIS — F200 Paranoid schizophrenia: Secondary | ICD-10-CM | POA: Diagnosis not present

## 2020-03-05 NOTE — [facility] Group Notes (Signed)
Adult Psychoeducational Group Not Date:  03/05/2020 Time:  0900-1045 Group Topic/Focus: PROGRESSIVE RELAXATION. A group where deep breathing is taught and tensing and relaxation muscle groups is used. Imagery is used as well.  Pts are asked to imagine 3 pillars that hold them up when they are not able to hold themselves up.  Participation Level:  Active  Participation Quality:  Appropriate  Affect:  Appropriate  Cognitive:  Oriented  Insight: Improving  Engagement in Group:  Engaged  Modes of Intervention:  Activity, Discussion, Education, and Support  Additional Comments:  States his cousin and his friend Simonne Come help to hold him up  Dione Housekeeper 03/05/2020`

## 2020-03-05 NOTE — [facility] Group Notes (Signed)
[facility] LCSW Group Therapy Note  Date/Time:  03/05/2020  11:00AM-12:00PM  Type of Therapy and Topic:  Group Therapy:  Music and Mood  Participation Level:  Minimal   Description of Group: In this process group, members listened to a variety of genres of music and identified that different types of music evoke different responses.  Patients were encouraged to identify music that was soothing for them and music that was energizing for them.  Patients discussed how this knowledge can help with wellness and recovery in various ways including managing depression and anxiety as well as encouraging healthy sleep habits.    Therapeutic Goals: 1. Patients will explore the impact of different varieties of music on mood 2. Patients will verbalize the thoughts they have when listening to different types of music 3. Patients will identify music that is soothing to them as well as music that is energizing to them 4. Patients will discuss how to use this knowledge to assist in maintaining wellness and recovery 5. Patients will explore the use of music as a coping skill  Summary of Patient Progress:  At the beginning of group, patient expressed that he felt alright.  When asked what an inspirational song entitled "Glory" made him feel, he responded "Like I got murdered and came back by Chad."  He left the group and returned numerous times.  He was extremely malodorous.  At the end of group, patient had left and could not be asked what his overall mood was after listening to the songs.    Therapeutic Modalities: Solution Focused Brief Therapy Activity   Ambrose Mantle, LCSW

## 2020-03-05 NOTE — Progress Notes (Signed)
   03/05/20 0900  Psych Admission Type (Psych Patients Only)  Admission Status Involuntary  Psychosocial Assessment  Patient Complaints None  Eye Contact Fair  Facial Expression Animated  Affect Anxious;Preoccupied  Speech Pressured;Tangential  Interaction Assertive  Motor Activity Restless  Appearance/Hygiene Unremarkable  Behavior Characteristics Cooperative  Mood Anxious;Pleasant  Aggressive Behavior  Effect No apparent injury  Thought Process  Coherency Tangential  Content Preoccupation  Delusions None reported or observed  Perception Hallucinations  Hallucination Auditory  Judgment Limited  Confusion None  Danger to Self  Current suicidal ideation? Denies  Danger to Others  Danger to Others None reported or observed

## 2020-03-05 NOTE — Progress Notes (Signed)
[place] NOVEL CORONAVIRUS (COVID-19) DAILY CHECK-OFF SYMPTOMS - answer yes or no to each - every day NO YES  Have you had a fever in the past 24 hours?  . Fever (Temp > 37.80C / 100F) X   Have you had any of these symptoms in the past 24 hours? . New Cough .  Sore Throat  .  Shortness of Breath .  Difficulty Breathing .  Unexplained Body Aches   X   Have you had any one of these symptoms in the past 24 hours not related to allergies?   . Runny Nose .  Nasal Congestion .  Sneezing   X   If you have had runny nose, nasal congestion, sneezing in the past 24 hours, has it worsened?  X   EXPOSURES - check yes or no X   Have you traveled outside the state in the past 14 days?  X   Have you been in contact with someone with a confirmed diagnosis of COVID-19 or PUI in the past 14 days without wearing appropriate PPE?  X   Have you been living in the same home as a person with confirmed diagnosis of COVID-19 or a PUI (household contact)?    X   Have you been diagnosed with COVID-19?    X              What to do next: Answered NO to all: Answered YES to anything:   Proceed with unit schedule Follow the [facility] Inpatient Flowsheet.   

## 2020-03-05 NOTE — Progress Notes (Signed)
[facility] MD Progress Note  03/05/2020 11:46 AM [name]  MRN:  [id number] Subjective:  Patient is a 44 year old male with a past psychiatric history significant for schizophrenia versus schizoaffective disorder who originally presented to the [facility] emergency department on 02/23/2020 after he had been found unresponsive. The patient had apparently been noncompliant with his psychiatric medications, but had taken an overdose of an unspecified medication. On arrival EMS stated the patient had been unresponsive but was protecting his airway.  Objective: Patient is seen and examined.  Patient is a 44 year old male with the above-stated past psychiatric history who is seen in follow-up.  His pacing and delusional thinking is stable, and his affect is pleasant.  He denied any side effects to his current medications.  Yesterday in group he endorsed an overall day of 10 out of 10.  His vital signs are stable, he is afebrile.  His sleep has remained stable after changes in his medications.  No new laboratories.  He denied any suicidal or homicidal ideation.   Principal Problem: Schizophrenia (HCC) Diagnosis: Principal Problem:   Schizophrenia (HCC) Active Problems:   Tobacco use disorder   Overdose  Total Time spent with patient: 15 minutes  Past Psychiatric History: See admission H&P  Past Medical History:  Past Medical History:  Diagnosis Date  . Bipolar disorder (HCC)   . Bronchitis   . Hyperlipidemia   . Paranoid schizophrenia (HCC)   . Schizophrenia [facility])     Past Surgical History:  Procedure Laterality Date  . TESTICLE IMPLANTATION TO THIGH    . TESTICLE SURGERY     Family History:  Family History  Problem Relation Age of Onset  . Schizophrenia Neg Hx    Family Psychiatric  History: See admission H&P Social History:  Social History   Substance and Sexual Activity  Alcohol Use Not Currently   Comment: weekly     Social History   Substance and Sexual Activity  Drug  Use Not Currently  . Types: Marijuana    Social History   Socioeconomic History  . Marital status: Single    Spouse name: Not on file  . Number of children: Not on file  . Years of education: 12 years  . Highest education level: 12th grade  Occupational History  . Occupation: On disability  Tobacco Use  . Smoking status: Current Every Day Smoker    Packs/day: 1.00    Types: Cigarettes  . Smokeless tobacco: Never Used  Vaping Use  . Vaping Use: Never used  Substance and Sexual Activity  . Alcohol use: Not Currently    Comment: weekly  . Drug use: Not Currently    Types: Marijuana  . Sexual activity: Yes    Birth control/protection: Condom  Other Topics Concern  . Not on file  Social History Narrative   Pt lives alone in apartment complex for mentally ill   Social Determinants of Health   Financial Resource Strain:   . Difficulty of Paying Living Expenses: Not on file  Food Insecurity:   . Worried About Programme researcher, broadcasting/film/video in the Last Year: Not on file  . Ran Out of Food in the Last Year: Not on file  Transportation Needs:   . Lack of Transportation (Medical): Not on file  . Lack of Transportation (Non-Medical): Not on file  Physical Activity:   . Days of Exercise per Week: Not on file  . Minutes of Exercise per Session: Not on file  Stress:   . Feeling  of Stress : Not on file  Social Connections:   . Frequency of Communication with Friends and Family: Not on file  . Frequency of Social Gatherings with Friends and Family: Not on file  . Attends Religious Services: Not on file  . Active Member of Clubs or Organizations: Not on file  . Attends BankerClub or Organization Meetings: Not on file  . Marital Status: Not on file   Additional Social History:                         Sleep: Good  Appetite:  Good  Current Medications: Current Facility-Administered Medications  Medication Dose Route Frequency Provider Last Rate Last Admin  . acetaminophen  (TYLENOL) tablet 650 mg  650 mg Oral Q6H PRN Denzil Magnusonhomas, Lashunda, NP   650 mg at 03/05/20 1008  . alum & mag hydroxide-simeth (MAALOX/MYLANTA) 200-200-20 MG/5ML suspension 30 mL  30 mL Oral Q4H PRN Denzil Magnusonhomas, Lashunda, NP   30 mL at 03/05/20 0930  . benztropine (COGENTIN) tablet 1 mg  1 mg Oral BID Antonieta Pertlary, Eldana Isip Lawson, MD   1 mg at 03/05/20 0803  . benztropine (COGENTIN) tablet 2 mg  2 mg Oral BID PRN Mariel CraftMaurer, [name] M, MD   2 mg at 03/04/20 2231   Or  . benztropine mesylate (COGENTIN) injection 2 mg  2 mg Intramuscular BID PRN Mariel CraftMaurer, [name] M, MD      . fluPHENAZine (PROLIXIN) tablet 10 mg  10 mg Oral Daily Antonieta Pertlary, Vanesha Athens Lawson, MD   10 mg at 03/05/20 0803  . fluPHENAZine (PROLIXIN) tablet 25 mg  25 mg Oral QHS Antonieta Pertlary, Haily Caley Lawson, MD   25 mg at 03/04/20 2043  . fluPHENAZine (PROLIXIN) tablet 5 mg  5 mg Oral TID PRN Antonieta Pertlary, Tranae Laramie Lawson, MD   5 mg at 03/02/20 1405  . hydrOXYzine (ATARAX/VISTARIL) tablet 25 mg  25 mg Oral Q6H PRN Mariel CraftMaurer, [name] M, MD   25 mg at 03/04/20 2231  . magnesium hydroxide (MILK OF MAGNESIA) suspension 30 mL  30 mL Oral Daily PRN Denzil Magnusonhomas, Lashunda, NP   30 mL at 03/03/20 1757  . nicotine (NICODERM CQ - dosed in mg/24 hours) patch 21 mg  21 mg Transdermal Daily Mariel CraftMaurer, [name] M, MD   21 mg at 03/05/20 0800  . OLANZapine (ZYPREXA) tablet 30 mg  30 mg Oral QHS Antonieta Pertlary, Jeanna Giuffre Lawson, MD   30 mg at 03/04/20 2044  . OLANZapine (ZYPREXA) tablet 5 mg  5 mg Oral Daily Antonieta Pertlary, Jenness Stemler Lawson, MD   5 mg at 03/05/20 0803  . [START ON 03/24/2020] paliperidone (INVEGA SUSTENNA) injection 234 mg  234 mg Intramuscular Q28 days Mariel CraftMaurer, [name] M, MD      . ziprasidone (GEODON) injection 20 mg  20 mg Intramuscular Q6H PRN Antonieta Pertlary, Munir Victorian Lawson, MD        Lab Results: No results found for this or any previous visit (from the past 48 hour(s)).  Blood Alcohol level:  Lab Results  Component Value Date   ETH <10 02/23/2020   ETH <10 02/01/2020    Metabolic Disorder Labs: Lab Results  Component Value Date    HGBA1C 5.5 02/03/2020   MPG 111.15 02/03/2020   MPG 102.54 06/01/2019   Lab Results  Component Value Date   PROLACTIN 8.1 06/01/2019   Lab Results  Component Value Date   CHOL 172 02/03/2020   TRIG 202 (H) 02/03/2020   HDL 35 (L) 02/03/2020   CHOLHDL 4.9 02/03/2020  VLDL 40 02/03/2020   LDLCALC 97 02/03/2020   LDLCALC 138 (H) 06/01/2019    Physical Findings: AIMS: Facial and Oral Movements Muscles of Facial Expression: None, normal Lips and Perioral Area: None, normal Jaw: None, normal Tongue: None, normal,Extremity Movements Upper (arms, wrists, hands, fingers): None, normal Lower (legs, knees, ankles, toes): None, normal, Trunk Movements Neck, shoulders, hips: None, normal, Overall Severity Severity of abnormal movements (highest score from questions above): None, normal Incapacitation due to abnormal movements: None, normal Patient's awareness of abnormal movements (rate only patient's report): No Awareness, Dental Status Current problems with teeth and/or dentures?: No Does patient usually wear dentures?: No  CIWA:  CIWA-Ar Total: 0 COWS:  COWS Total Score: 1  Musculoskeletal: Strength & Muscle Tone: within normal limits Gait & Station: normal Patient leans: N/A  Psychiatric Specialty Exam: Physical Exam Vitals and nursing note reviewed.  Constitutional:      Appearance: Normal appearance.  HENT:     Head: Normocephalic and atraumatic.  Pulmonary:     Effort: Pulmonary effort is normal.  Neurological:     General: No focal deficit present.     Mental Status: He is alert and oriented to person, place, and time.     Review of Systems  Blood pressure 119/89, pulse 98, temperature 98.1 F (36.7 C), temperature source Oral, resp. rate 20, height 6\' 1"  (1.854 m), weight 125.6 kg, SpO2 100 %.Body mass index is 36.55 kg/m.  General Appearance: Casual  Eye Contact:  Fair  Speech:  Pressured  Volume:  Normal  Mood:  Euthymic  Affect:  Congruent  Thought  Process:  Goal Directed and Descriptions of Associations: Tangential  Orientation:  Negative  Thought Content:  Delusions, Rumination and Tangential  Suicidal Thoughts:  No  Homicidal Thoughts:  No  Memory:  Immediate;   Fair Recent;   Fair Remote;   Fair  Judgement:  Intact  Insight:  Fair  Psychomotor Activity:  Increased  Concentration:  Concentration: Fair and Attention Span: Fair  Recall:  of Knowledge:  Fair  Language:  Good  Akathisia:  Negative  Handed:  Right  AIMS (if indicated):     Assets:  Desire for Improvement Housing Resilience Social Support  ADL's:  Intact  Cognition:  WNL  Sleep:  Number of Hours: 6     Treatment Plan Summary: Daily contact with patient to assess and evaluate symptoms and progress in treatment, Medication management and Plan : Patient is seen and examined.  Patient is a 44 year old male with the above-stated past psychiatric history who is seen in follow-up.  Diagnosis: 1. Schizoaffective disorder; bipolar type 2. Probable Trileptal overdose  Pertinent findings on examination today: 1.  Remains pleasantly delusional and tangential at times.  Mildly disorganized.  He is very near his baseline. 2.  Sleep is improved. 3.  Vital signs are stable. 4.  When discharge patient will most probably return to his previous apartment with ACTT better observation.  Plan: 1.  Continue Cogentin 1 mg p.o. twice daily for side effects of medication. 2.  Continue Prolixin to 10 mg p.o. daily and 25 mg p.o. nightly for mood stability, sedation and psychosis. 3.  Continue hydroxyzine 25 mg p.o. every 6 hours as needed anxiety. 4.  Continue Zyprexa 5 mg p.o. daily and 30 mg p.o. nightly for mood stability, sleep and psychosis. 5.  Patient received the long-acting paliperidone injection 234 mg last on 02/22/2020. 6.  Continue Geodon 20 mg IM every 6 hours as  needed agitation. 7.  Disposition planning-in progress. Antonieta Pert,  MD 03/05/2020, 11:46 AM

## 2020-03-05 NOTE — Progress Notes (Signed)
Adult Psychoeducational Group Note  Date:  03/05/2020 Time:  2:51 AM  Group Topic/Focus:  Wrap-Up Group:   The focus of this group is to help patients review their daily goal of treatment and discuss progress on daily workbooks.  Participation Level:  Minimal  Participation Quality:  Appropriate  Affect:  Appropriate  Cognitive:  Disorganized   Insight: Limited  Engagement in Group:  Limited  Modes of Intervention:  Discussion  Additional Comments:  Pt stated his goal for today was to focus on his treatment plan. Pt stated he accomplished his goal today. Pt stated he was able to talk with his doctor and social worker about his care today. Pt rated his overall day a 10. Pt stated his relationship with his family and support system needs to be improved. Pt state he was able to contact his friend today, which improved his day. Pt stated he was able to attend all meals. Pt stated he took all medications provided today. Pt stated he felt better about himself today.  Pt stated his appetite was pretty good today. Pt rated sleep last night was pretty good. Pt stated he was no physical pain today.  Pt deny auditory or visual hallucinations. Pt denies thoughts of harming himself or others. Pt stated he would alert staff if anything changes.  [name] 03/05/2020, 2:51 AM

## 2020-03-05 NOTE — Progress Notes (Signed)
Adult Psychoeducational Group Note  Date:  03/05/2020 Time:  9:06 PM  Group Topic/Focus:  Wrap-Up Group:   The focus of this group is to help patients review their daily goal of treatment and discuss progress on daily workbooks.  Participation Level:  Minimal  Participation Quality:  Appropriate  Affect:  Appropriate  Cognitive:  Disorganized  Insight: Limited  Engagement in Group:  Limited  Modes of Intervention:  Discussion  Additional Comments: Pt stated his goal for today was to focus on his treatment plan. Pt stated he accomplished his goal today. Pt stated he was able to talk with his doctor and social worker about his care today. Pt rated his overall day a 10. Pt stated his relationship with his family and support system needs to be improved. Pt state he was able to contact his mother and father today, which improved his day. Pt stated he was able to attend all meals. Pt stated he took all medications provided today. Pt stated he felt better about himself today.  Pt stated his appetite was pretty good today. Pt rated sleep last night was pretty good. Pt stated he was no physical pain today.  Pt deny auditory or visual hallucinations. Pt denies thoughts of harming himself or others. Pt stated he would alert staff if anything changes.   Felipa Furnace 03/05/2020, 9:06 PM

## 2020-03-05 NOTE — Progress Notes (Signed)
   03/05/20 2140  Psych Admission Type (Psych Patients Only)  Admission Status Involuntary  Psychosocial Assessment  Patient Complaints None  Eye Contact Fair  Facial Expression Flat  Affect Anxious;Preoccupied  Speech Logical/coherent  Interaction Assertive  Motor Activity Slow  Appearance/Hygiene Unremarkable  Behavior Characteristics Cooperative  Mood Anxious  Thought Process  Coherency Tangential  Content Preoccupation  Delusions None reported or observed  Perception WDL  Hallucination None reported or observed  Judgment Limited  Confusion None  Danger to Self  Current suicidal ideation? Denies  Danger to Others  Danger to Others None reported or observed   Pt denies SI, HI, AVH and pain.

## 2020-03-06 MED ORDER — PALIPERIDONE PALMITATE ER 234 MG/1.5ML IM SUSY: 234.0000 mg | PREFILLED_SYRINGE | INTRAMUSCULAR | 0 refills | Status: DC

## 2020-03-06 MED ORDER — FLUPHENAZINE HCL 10 MG PO TABS: 10.0000 mg | ORAL_TABLET | Freq: Every day | ORAL | 0 refills | Status: DC

## 2020-03-06 MED ORDER — BENZTROPINE MESYLATE 1 MG PO TABS: 1.0000 mg | ORAL_TABLET | Freq: Two times a day (BID) | ORAL | 0 refills | Status: DC

## 2020-03-06 MED ORDER — FLUPHENAZINE HCL 5 MG PO TABS
25.0000 mg | ORAL_TABLET | Freq: Every day | ORAL | 0 refills | Status: DC
Start: 2020-03-06 — End: 2021-11-02

## 2020-03-06 MED ORDER — NICOTINE 21 MG/24HR TD PT24: 21.0000 mg | MEDICATED_PATCH | Freq: Every day | TRANSDERMAL | 0 refills | Status: DC

## 2020-03-06 MED ORDER — OLANZAPINE 15 MG PO TABS: 30.0000 mg | ORAL_TABLET | Freq: Every day | ORAL | 0 refills | Status: DC

## 2020-03-06 MED ORDER — OLANZAPINE 5 MG PO TABS: 5.0000 mg | ORAL_TABLET | Freq: Every day | ORAL | 0 refills | Status: DC

## 2020-03-06 NOTE — Discharge Summary (Signed)
Physician Discharge Summary Note  Patient:  [name] is an 44 y.o., male MRN:  [id number] DOB:  1975-06-03 Patient phone:  [phone] (home)  Patient address:   [address] [place] [id number],  Total Time spent with patient: 15 minutes  Date of Admission:  02/24/2020 Date of Discharge: 03/06/20  Reason for Admission:  Overdose on medications with acute psychosis  Principal Problem: Schizophrenia [facility](HCC) Discharge Diagnoses: Principal Problem:   Schizophrenia (HCC) Active Problems:   Tobacco use disorder   Overdose   Past Psychiatric History: He was apparently diagnosed with schizophrenia in 1998.  He reports at least 5-6 psychiatric hospitalizations in the past, with most recent discharge on 02/08/2020 from this facility.  He has had multiple emergency department visits for psychosis.  His last psychiatric hospitalization at [facility] on 11/21/2019.  At that time he was quite psychotic.  It stated in their admission note that he was well known in the [facility] emergency department. He was seen there on 6/10 in which he was afraid of his apartment because someone was eating his food.  He is followed by ACTT service.  He has been previously treated with oral Abilify as well as long-acting Abilify injections, Wellbutrin, Cogentin, Prolixin, trazodone.  His discharge medications from [facility]:, Wellbutrin, Benadryl, Prolixin, Topamax were stopped and he was started on Cogentin, Trileptal, Invega and trazodone  Past Medical History:  Past Medical History:  Diagnosis Date  . Bipolar disorder (HCC)   . Bronchitis   . Hyperlipidemia   . Paranoid schizophrenia (HCC)   . Schizophrenia [facility](HCC)     Past Surgical History:  Procedure Laterality Date  . TESTICLE IMPLANTATION TO THIGH    . TESTICLE SURGERY     Family History:  Family History  Problem Relation Age of Onset  . Schizophrenia Neg Hx    Family Psychiatric  History: Denies Social History:  Social  History   Substance and Sexual Activity  Alcohol Use Not Currently   Comment: weekly     Social History   Substance and Sexual Activity  Drug Use Not Currently  . Types: Marijuana    Social History   Socioeconomic History  . Marital status: Single    Spouse name: Not on file  . Number of children: Not on file  . Years of education: 12 years  . Highest education level: 12th grade  Occupational History  . Occupation: On disability  Tobacco Use  . Smoking status: Current Every Day Smoker    Packs/day: 1.00    Types: Cigarettes  . Smokeless tobacco: Never Used  Vaping Use  . Vaping Use: Never used  Substance and Sexual Activity  . Alcohol use: Not Currently    Comment: weekly  . Drug use: Not Currently    Types: Marijuana  . Sexual activity: Yes    Birth control/protection: Condom  Other Topics Concern  . Not on file  Social History Narrative   Pt lives alone in apartment complex for mentally ill   Social Determinants of Health   Financial Resource Strain:   . Difficulty of Paying Living Expenses: Not on file  Food Insecurity:   . Worried About Programme researcher, broadcasting/film/videounning Out of Food in the Last Year: Not on file  . Ran Out of Food in the Last Year: Not on file  Transportation Needs:   . Lack of Transportation (Medical): Not on file  . Lack of Transportation (Non-Medical): Not on file  Physical Activity:   . Days of  Exercise per Week: Not on file  . Minutes of Exercise per Session: Not on file  Stress:   . Feeling of Stress : Not on file  Social Connections:   . Frequency of Communication with Friends and Family: Not on file  . Frequency of Social Gatherings with Friends and Family: Not on file  . Attends Religious Services: Not on file  . Active Member of Clubs or Organizations: Not on file  . Attends Banker Meetings: Not on file  . Marital Status: Not on file    Hospital Course:  From admission assessment 02/23/20: [name] is an 44 y.o. male presenting  voluntarily to [facility] via EMS after being found unresponsive by mother with vomit on him and drooling. Patient renders limited history as he states he states he does not remember much about the events that led him to the hospital. Patient states "I took too many pills. It was paliperidone. I wasn't trying to hurt myself. I was trying to heal myself from the pain of losing loved ones." Patient denies current SI/HI/AVH. Per chart review patient was discharged from [facility] on 10/12 after spending 1 week there.  He was hospitalized due to AVH and paranoid delusions. Patient continues to present somewhat bizarre and is disorganized. He expressed paranoia about the lack of communication between his parents, his mother's boyfriend, and the "rearranging" at this apartment complex where he lives. Patient denies any substance use or criminal charges. He gives verbal consent for TTS to contact his mother, [name] at [phone] for collateral information. Per collateral: After d/c from [facility] patient changed his phone number and no one from his pharmacy- [facility]- was able to reach him. She states she took him to the pharmacy as he states he has not had any of his medications since discharge and pharmacy confirmed he did not receive his new medications. Mother believes he took an overdose of an old prescription. Last night he asked to stay at her home and she allowed him to. This morning she woke up to find patient staring out the back window with his pants down, drooling, and speaking to her incoherently. She states at that time she called 911. She states patient works with [employer] but that their contact is consistent. She believes patient needs to be hospitalized and restarted on medications.   [name] was admitted after overdose on medication as described above. He was bizarre, disorganized, agitated, intrusive, pressured and delusional on admission with poor sleep. He remained on the [facility]  unit for 11 days. He had received Invega Sustenna 234 mg prior to admission on 02/22/20. He was started on oral Invega and Haldol but had persistent manic and psychotic symptoms. Haldol was discontinued, and Prolixin was started and titrated. Neurontin was added and titrated. He remained symptomatic. Invega and Neurontin were discontinued and Zyprexa was added. Mood stabilizers were not started due to patient's recent overdose on medication. He participated in group therapy on the unit. He responded well to treatment with no adverse effects reported. He has shown calmer mood and affect with improved sleep and interaction. He denies any SI/HI/AVH and contracts for safety. The treatment team agrees patient has appeared to his baseline mental status. He is discharging on the medications listed below. He will be continuing ACT team services with [facility]. Patient is provided with prescriptions for medications upon discharge. He is discharging home via [employer].  Physical Findings: AIMS: Facial and Oral Movements Muscles of  Facial Expression: None, normal Lips and Perioral Area: None, normal Jaw: None, normal Tongue: None, normal,Extremity Movements Upper (arms, wrists, hands, fingers): None, normal Lower (legs, knees, ankles, toes): None, normal, Trunk Movements Neck, shoulders, hips: None, normal, Overall Severity Severity of abnormal movements (highest score from questions above): None, normal Incapacitation due to abnormal movements: None, normal Patient's awareness of abnormal movements (rate only patient's report): No Awareness, Dental Status Current problems with teeth and/or dentures?: No Does patient usually wear dentures?: No  CIWA:  CIWA-Ar Total: 0 COWS:  COWS Total Score: 1  Musculoskeletal: Strength & Muscle Tone: within normal limits Gait & Station: normal Patient leans: N/A  Psychiatric Specialty Exam: Physical Exam Vitals and nursing note reviewed.  Constitutional:       Appearance: He is well-developed.  Cardiovascular:     Rate and Rhythm: Normal rate.  Pulmonary:     Effort: Pulmonary effort is normal.  Neurological:     Mental Status: He is alert and oriented to person, place, and time.     Review of Systems  Constitutional: Negative.   Respiratory: Negative for cough and shortness of breath.   Psychiatric/Behavioral: Negative for agitation, behavioral problems, confusion, decreased concentration, dysphoric mood, hallucinations, self-injury, sleep disturbance and suicidal ideas. The patient is not nervous/anxious and is not hyperactive.     Blood pressure (!) 121/92, pulse (!) 103, temperature 98.1 F (36.7 C), temperature source Oral, resp. rate 20, height 6\' 1"  (1.854 m), weight 125.6 kg, SpO2 100 %.Body mass index is 36.55 kg/m.  See MD's discharge SRA    Have you used any form of tobacco in the last 30 days? (Cigarettes, Smokeless Tobacco, Cigars, and/or Pipes): Yes  Has this patient used any form of tobacco in the last 30 days? (Cigarettes, Smokeless Tobacco, Cigars, and/or Pipes) Yes, a prescription for an FDA-approved medication for tobacco cessation was offered at discharge.   Blood Alcohol level:  Lab Results  Component Value Date   ETH <10 02/23/2020   ETH <10 02/01/2020    Metabolic Disorder Labs:  Lab Results  Component Value Date   HGBA1C 5.5 02/03/2020   MPG 111.15 02/03/2020   MPG 102.54 06/01/2019   Lab Results  Component Value Date   PROLACTIN 8.1 06/01/2019   Lab Results  Component Value Date   CHOL 172 02/03/2020   TRIG 202 (H) 02/03/2020   HDL 35 (L) 02/03/2020   CHOLHDL 4.9 02/03/2020   VLDL 40 02/03/2020   LDLCALC 97 02/03/2020   LDLCALC 138 (H) 06/01/2019    See Psychiatric Specialty Exam and Suicide Risk Assessment completed by Attending Physician prior to discharge.  Discharge destination:  Home  Is patient on multiple antipsychotic therapies at discharge:  Yes,   Do you recommend tapering to  monotherapy for antipsychotics?  Yes   Has Patient had three or more failed trials of antipsychotic monotherapy by history:  Yes,   Antipsychotic medications that previously failed include:   1.  Abilify., 2.  07/30/2019. and 3.  Haldol.  Recommended Plan for Multiple Antipsychotic Therapies: Taper to monotherapy as described:  Outpatient psychiatrist may taper to antipsychotic monotherapy as patient's symptoms allow.  Discharge Instructions    Diet general   Complete by: As directed    Increase activity slowly   Complete by: As directed      Allergies as of 03/06/2020      Reactions   Other    Some trial medicine from [facility].       Medication List  TAKE these medications     Indication  benztropine 1 MG tablet Commonly known as: COGENTIN Take 1 tablet (1 mg total) by mouth 2 (two) times daily.  Indication: Extrapyramidal Reaction caused by Medications   fluPHENAZine 5 MG tablet Commonly known as: PROLIXIN Take 5 tablets (25 mg total) by mouth at bedtime.  Indication: Schizophrenia   fluPHENAZine 10 MG tablet Commonly known as: PROLIXIN Take 1 tablet (10 mg total) by mouth daily. Start taking on: March 07, 2020  Indication: Schizophrenia   nicotine 21 mg/24hr patch Commonly known as: NICODERM CQ - dosed in mg/24 hours Place 1 patch (21 mg total) onto the skin daily. Start taking on: March 07, 2020  Indication: Nicotine Addiction   OLANZapine 15 MG tablet Commonly known as: ZYPREXA Take 2 tablets (30 mg total) by mouth at bedtime.  Indication: Schizophrenia   OLANZapine 5 MG tablet Commonly known as: ZYPREXA Take 1 tablet (5 mg total) by mouth daily. Start taking on: March 07, 2020  Indication: Schizophrenia   paliperidone 234 MG/1.5ML Susy injection Commonly known as: INVEGA SUSTENNA Inject 234 mg into the muscle every 28 (twenty-eight) days. Start taking on: March 21, 2020  Indication: Schizophrenia       Follow-up Information    Services,  [facility] Follow up.   Why: Fax d/c paperwork to : [phone].  Please follow up with this provider for ACTT services. Contact information: [address] [place] [address] [phone]               Follow-up recommendations: Activity as tolerated. Diet as recommended by primary care physician. Keep all scheduled follow-up appointments as recommended.   Comments:   Patient is instructed to take all prescribed medications as recommended. Report any side effects or adverse reactions to your outpatient psychiatrist. Patient is instructed to abstain from alcohol and illegal drugs while on prescription medications. In the event of worsening symptoms, patient is instructed to call the crisis hotline, 911, or go to the nearest emergency department for evaluation and treatment.  Signed: Aldean Baker, NP 03/06/2020, 4:35 PM

## 2020-03-06 NOTE — Progress Notes (Signed)
Recreation Therapy Notes  Date: 11.8.21 Time: 1000 Location: 500 Hall Dayroom  Group Topic: Self-Esteem  Goal Area(s) Addresses:  Patient will successfully identify positive attributes about themselves.  Patient will successfully identify benefit of improved self-esteem.   Behavioral Response: Engaged  Intervention: Worksheet, colored pencils, music  Activity: Crest of Arms.  Patients were given a crest divided into four spaces.  In each space, patients were to identify something they value and why it is important to them.  Education:  Self-Esteem, Building control surveyor.   Education Outcome: Acknowledges education/In group clarification offered/Needs additional education  Clinical Observations/Feedback:  Pt was attentive and active throughout.  Pt identified some of the things he values as the chinese zodiac dragon, the taurus bull, the [place] of [place] and Christmas Day.  Pt expressed all of these things represented the best times of his life.     Caroll Rancher, LRT/CTRS     Caroll Rancher A 03/06/2020 11:31 AM

## 2020-03-06 NOTE — Progress Notes (Signed)
  [facility] Case [facility] Discharge Plan :  Will you be returning to the same living situation after discharge:  Yes,  to apartment At discharge, do you have transportation home?: No. Safe Transport to be arranged Do you have the ability to pay for your medications: Yes,  has insurance   Release of information consent forms completed and in the chart;  Patient's signature needed at discharge.  Patient to Follow up at:  Follow-up Information    Services, [facility] Follow up.   Why: Fax d/c paperwork to : [phone].  Please follow up with this provider for ACTT services. Contact information: [address] [place] [address] [phone]               Next level of care provider has access to [facility] Link:no  Safety Planning and Suicide Prevention discussed: Yes,  with mother  Have you used any form of tobacco in the last 30 days? (Cigarettes, Smokeless Tobacco, Cigars, and/or Pipes): Yes  Has patient been referred to the Quitline?: Patient refused referral  Patient has been referred for addiction treatment: Pt. refused referral  Otelia Santee, LCSW 03/06/2020, 9:20 AM

## 2020-03-06 NOTE — Progress Notes (Signed)
Pt discharged to lobby. Pt was stable and appreciative at that time. All papers and prescriptions were given and valuables returned. Verbal understanding expressed. Denies SI/HI and A/VH. Pt given opportunity to express concerns and ask questions.  

## 2020-03-06 NOTE — [facility] Suicide Risk Assessment (Signed)
[facility] Discharge Suicide Risk Assessment   Principal Problem: Schizophrenia [facility]) Discharge Diagnoses: Principal Problem:   Schizophrenia (HCC) Active Problems:   Tobacco use disorder   Overdose   Total Time spent with patient: 15 minutes  Musculoskeletal: Strength & Muscle Tone: within normal limits Gait & Station: normal Patient leans: N/A  Psychiatric Specialty Exam: Review of Systems  Eyes: Negative for redness.  Respiratory: Negative for shortness of breath.   Cardiovascular: Negative for chest pain.  Neurological: Negative for facial asymmetry.    Blood pressure (!) 121/92, pulse (!) 103, temperature 98.1 F (36.7 C), temperature source Oral, resp. rate 20, height 6\' 1"  (1.854 m), weight 125.6 kg, SpO2 100 %.Body mass index is 36.55 kg/m.  General Appearance: Casual and Fairly Groomed  ::  Fair  Speech:  Clear and Coherent and Normal Rate409  Volume:  Normal  Mood:  "good"  Affect:  Appropriate, Constricted and euthymic  Thought Process:  Superficially linear, disorganized and tangential at times  Orientation:  Full (Time, Place, and Person)  Thought Content:  no SI/HI, +delusions  Suicidal Thoughts:  No  Homicidal Thoughts:  No  Memory:  Immediate;   Fair Recent;   Fair  Judgement:  Fair  Insight:  Fair  Psychomotor Activity:  Normal  Concentration:  Good  Recall:  [id number] of Knowledge:Fair  Language: NA  Akathisia:  No  Handed:  Right  AIMS (if indicated):     Assets:  Communication Skills Desire for Improvement Housing  Sleep:  Number of Hours: 9.5  Cognition: WNL  ADL's:  Intact   Mental Status Per Nursing Assessment::   On Admission:  NA  Demographic Factors:  Male, Low socioeconomic status and Unemployed  Loss Factors: NA  Historical Factors: Impulsivity  Risk Reduction Factors:   Positive social support  Continued Clinical Symptoms:  Schizophrenia:   Paranoid or undifferentiated type At baseline patient delusional and  disorganized Cognitive Features That Contribute To Risk:  Thought constriction (tunnel vision)    Suicide Risk:  Minimal: No identifiable suicidal ideation.  Patients presenting with no risk factors but with morbid ruminations; may be classified as minimal risk based on the severity of the depressive symptoms   Follow-up Information    Services, [facility] Follow up.   Why: Fax d/c paperwork to : [phone].  Please follow up with this provider for ACTT services. Contact information: [address] [place] [phone]               Plan Of Care/Follow-up recommendations:  Activity:  as tolerated Diet:  regular Other:      Patient is instructed prior to discharge to: Take all medications as prescribed by his/her mental healthcare provider. Report any adverse effects and or reactions from the medicines to his/her outpatient provider promptly. Patient has been instructed & cautioned: To not engage in alcohol and or illegal drug use while on prescription medicines. In the event of worsening symptoms, patient is instructed to call the crisis hotline, 911 and or go to the nearest ED for appropriate evaluation and treatment of symptoms. To follow-up with his/her primary care provider for your other medical issues, concerns and or health care needs.     [phone], MD 03/06/2020, 9:25 AM

## 2020-03-06 NOTE — Tx Team (Signed)
Interdisciplinary Treatment and Diagnostic Plan Update  03/06/2020 Time of Session: 9:10am [name] MRN: [id number]  Principal Diagnosis: Schizophrenia [facility])  Secondary Diagnoses: Principal Problem:   Schizophrenia (HCC) Active Problems:   Tobacco use disorder   Overdose   Current Medications:  Current Facility-Administered Medications  Medication Dose Route Frequency Provider Last Rate Last Admin  . acetaminophen (TYLENOL) tablet 650 mg  650 mg Oral Q6H PRN Denzil Magnuson, NP   650 mg at 03/06/20 0131  . alum & mag hydroxide-simeth (MAALOX/MYLANTA) 200-200-20 MG/5ML suspension 30 mL  30 mL Oral Q4H PRN Denzil Magnuson, NP   30 mL at 03/05/20 0930  . benztropine (COGENTIN) tablet 1 mg  1 mg Oral BID Antonieta Pert, MD   1 mg at 03/06/20 0756  . benztropine (COGENTIN) tablet 2 mg  2 mg Oral BID PRN Mariel Craft, MD   2 mg at 03/04/20 2231   Or  . benztropine mesylate (COGENTIN) injection 2 mg  2 mg Intramuscular BID PRN Mariel Craft, MD      . fluPHENAZine (PROLIXIN) tablet 10 mg  10 mg Oral Daily Antonieta Pert, MD   10 mg at 03/06/20 0757  . fluPHENAZine (PROLIXIN) tablet 25 mg  25 mg Oral QHS Antonieta Pert, MD   25 mg at 03/05/20 2042  . fluPHENAZine (PROLIXIN) tablet 5 mg  5 mg Oral TID PRN Antonieta Pert, MD   5 mg at 03/02/20 1405  . hydrOXYzine (ATARAX/VISTARIL) tablet 25 mg  25 mg Oral Q6H PRN Mariel Craft, MD   25 mg at 03/05/20 2042  . magnesium hydroxide (MILK OF MAGNESIA) suspension 30 mL  30 mL Oral Daily PRN Denzil Magnuson, NP   30 mL at 03/03/20 1757  . nicotine (NICODERM CQ - dosed in mg/24 hours) patch 21 mg  21 mg Transdermal Daily Mariel Craft, MD   21 mg at 03/06/20 4268  . OLANZapine (ZYPREXA) tablet 30 mg  30 mg Oral QHS Antonieta Pert, MD   30 mg at 03/05/20 2042  . OLANZapine (ZYPREXA) tablet 5 mg  5 mg Oral Daily Antonieta Pert, MD   5 mg at 03/06/20 0756  . [START ON 03/24/2020] paliperidone (INVEGA SUSTENNA)  injection 234 mg  234 mg Intramuscular Q28 days Mariel Craft, MD      . ziprasidone (GEODON) injection 20 mg  20 mg Intramuscular Q6H PRN Antonieta Pert, MD       PTA Medications: No medications prior to admission.    Patient Stressors: Marital or family conflict Medication change or noncompliance  Patient Strengths: Geographical information systems officer for treatment/growth  Treatment Modalities: Medication Management, Group therapy, Case management,  1 to 1 session with clinician, Psychoeducation, Recreational therapy.   Physician Treatment Plan for Primary Diagnosis: Schizophrenia (HCC) Long Term Goal(s): Improvement in symptoms so as ready for discharge Improvement in symptoms so as ready for discharge   Short Term Goals: Ability to identify changes in lifestyle to reduce recurrence of condition will improve Ability to verbalize feelings will improve Ability to demonstrate self-control will improve Ability to identify and develop effective coping behaviors will improve Ability to maintain clinical measurements within normal limits will improve Compliance with prescribed medications will improve Ability to identify changes in lifestyle to reduce recurrence of condition will improve Ability to verbalize feelings will improve Ability to demonstrate self-control will improve Ability to identify and develop effective coping behaviors will improve Ability to maintain clinical measurements  within normal limits will improve Compliance with prescribed medications will improve  Medication Management: Evaluate patient's response, side effects, and tolerance of medication regimen.  Therapeutic Interventions: 1 to 1 sessions, Unit Group sessions and Medication administration.  Evaluation of Outcomes: Adequate for Discharge  Physician Treatment Plan for Secondary Diagnosis: Principal Problem:   Schizophrenia (HCC) Active Problems:   Tobacco use disorder   Overdose  Long  Term Goal(s): Improvement in symptoms so as ready for discharge Improvement in symptoms so as ready for discharge   Short Term Goals: Ability to identify changes in lifestyle to reduce recurrence of condition will improve Ability to verbalize feelings will improve Ability to demonstrate self-control will improve Ability to identify and develop effective coping behaviors will improve Ability to maintain clinical measurements within normal limits will improve Compliance with prescribed medications will improve Ability to identify changes in lifestyle to reduce recurrence of condition will improve Ability to verbalize feelings will improve Ability to demonstrate self-control will improve Ability to identify and develop effective coping behaviors will improve Ability to maintain clinical measurements within normal limits will improve Compliance with prescribed medications will improve     Medication Management: Evaluate patient's response, side effects, and tolerance of medication regimen.  Therapeutic Interventions: 1 to 1 sessions, Unit Group sessions and Medication administration.  Evaluation of Outcomes: Adequate for Discharge   RN Treatment Plan for Primary Diagnosis: Schizophrenia (HCC) Long Term Goal(s): Knowledge of disease and therapeutic regimen to maintain health will improve  Short Term Goals: Ability to demonstrate self-control, Ability to verbalize feelings will improve and Ability to identify and develop effective coping behaviors will improve  Medication Management: RN will administer medications as ordered by provider, will assess and evaluate patient's response and provide education to patient for prescribed medication. RN will report any adverse and/or side effects to prescribing provider.  Therapeutic Interventions: 1 on 1 counseling sessions, Psychoeducation, Medication administration, Evaluate responses to treatment, Monitor vital signs and CBGs as ordered,  Perform/monitor CIWA, COWS, AIMS and Fall Risk screenings as ordered, Perform wound care treatments as ordered.  Evaluation of Outcomes: Adequate for Discharge   LCSW Treatment Plan for Primary Diagnosis: Schizophrenia [facility]) Long Term Goal(s): Safe transition to appropriate next level of care at discharge, Engage patient in therapeutic group addressing interpersonal concerns.  Short Term Goals: Engage patient in aftercare planning with referrals and resources, Increase ability to appropriately verbalize feelings, Facilitate patient progression through stages of change regarding substance use diagnoses and concerns and Identify triggers associated with mental health/substance abuse issues  Therapeutic Interventions: Assess for all discharge needs, 1 to 1 time with Social worker, Explore available resources and support systems, Assess for adequacy in community support network, Educate family and significant other(s) on suicide prevention, Complete Psychosocial Assessment, Interpersonal group therapy.  Evaluation of Outcomes: Adequate for Discharge   Progress in Treatment: Attending groups: Yes. Participating in groups: Yes. Taking medication as prescribed: Yes. Toleration medication: Yes. Family/Significant other contact made: Yes, individual(s) contacted:  with mother Patient understands diagnosis: Yes. Discussing patient identified problems/goals with staff: Yes. Medical problems stabilized or resolved: Yes. Denies suicidal/homicidal ideation: Yes. Issues/concerns per patient self-inventory: No.   New problem(s) identified: No, Describe:  none  New Short Term/Long Term Goal(s):medication stabilization, elimination of SI thoughts, development of comprehensive mental wellness plan.  Patient Goals:  "to grow my hair back out thick"  Discharge Plan or Barriers: Patient to discharge to own apartment with mother and follow up with [facility].  Reason for Continuation of Hospitalization:  Medication stabilization  Estimated Length of Stay: Adequate for discharge   Attendees: Patient: [name] 03/06/2020   Physician: Earlene Plater, MD 03/06/2020   Nursing:  03/06/2020   RN Care Manager: 03/06/2020   Social Worker: Melba Coon, LCSWA 03/06/2020   Recreational Therapist:  03/06/2020   Other:  03/06/2020   Other:  03/06/2020   Other: 03/06/2020     Scribe for Treatment Team: [name], LCSWA 03/06/2020 9:13 AM

## 2020-03-17 ENCOUNTER — Emergency Department (HOSPITAL_COMMUNITY): Payer: Medicare HMO

## 2020-03-17 ENCOUNTER — Emergency Department (HOSPITAL_COMMUNITY)
Admission: EM | Admit: 2020-03-17 | Discharge: 2020-03-17 | Disposition: A | Discharge status: Home / Self Care | Payer: Medicare HMO | Attending: Emergency Medicine | Admitting: Emergency Medicine

## 2020-03-17 ENCOUNTER — Encounter (HOSPITAL_COMMUNITY): Payer: Self-pay

## 2020-03-17 ENCOUNTER — Other Ambulatory Visit: Payer: Self-pay

## 2020-03-17 DIAGNOSIS — F17210 Nicotine dependence, cigarettes, uncomplicated: Secondary | ICD-10-CM | POA: Insufficient documentation

## 2020-03-17 DIAGNOSIS — R2241 Localized swelling, mass and lump, right lower limb: Secondary | ICD-10-CM | POA: Diagnosis not present

## 2020-03-17 DIAGNOSIS — L03115 Cellulitis of right lower limb: Secondary | ICD-10-CM | POA: Diagnosis not present

## 2020-03-17 DIAGNOSIS — M1711 Unilateral primary osteoarthritis, right knee: Secondary | ICD-10-CM | POA: Diagnosis not present

## 2020-03-17 DIAGNOSIS — M25561 Pain in right knee: Secondary | ICD-10-CM | POA: Insufficient documentation

## 2020-03-17 DIAGNOSIS — M7989 Other specified soft tissue disorders: Secondary | ICD-10-CM | POA: Diagnosis not present

## 2020-03-17 DIAGNOSIS — Z79899 Other long term (current) drug therapy: Secondary | ICD-10-CM | POA: Diagnosis not present

## 2020-03-17 LAB — CBC WITH DIFFERENTIAL/PLATELET
Abs Immature Granulocytes: 0.03 10*3/uL (ref 0.00–0.07)
Basophils Absolute: 0.1 10*3/uL (ref 0.0–0.1)
Basophils Relative: 0 %
Eosinophils Absolute: 0.2 10*3/uL (ref 0.0–0.5)
Eosinophils Relative: 2 %
HCT: 38.4 % — ABNORMAL LOW (ref 39.0–52.0)
Hemoglobin: 13 g/dL (ref 13.0–17.0)
Immature Granulocytes: 0 %
Lymphocytes Relative: 22 %
Lymphs Abs: 2.4 10*3/uL (ref 0.7–4.0)
MCH: 29.6 pg (ref 26.0–34.0)
MCHC: 33.9 g/dL (ref 30.0–36.0)
MCV: 87.5 fL (ref 80.0–100.0)
Monocytes Absolute: 1 10*3/uL (ref 0.1–1.0)
Monocytes Relative: 9 %
Neutro Abs: 7.6 10*3/uL (ref 1.7–7.7)
Neutrophils Relative %: 67 %
Platelets: 329 10*3/uL (ref 150–400)
RBC: 4.39 MIL/uL (ref 4.22–5.81)
RDW: 12.3 % (ref 11.5–15.5)
WBC: 11.3 10*3/uL — ABNORMAL HIGH (ref 4.0–10.5)
nRBC: 0 % (ref 0.0–0.2)

## 2020-03-17 LAB — BASIC METABOLIC PANEL
Anion gap: >20 — ABNORMAL HIGH (ref 5–15)
Anion gap: 8 (ref 5–15)
BUN: 8 mg/dL (ref 6–20)
BUN: 10 mg/dL (ref 6–20)
CO2: 11 mmol/L — ABNORMAL LOW (ref 22–32)
CO2: 27 mmol/L (ref 22–32)
Calcium: <4 mg/dL — CL (ref 8.9–10.3)
Calcium: 9.1 mg/dL (ref 8.9–10.3)
Chloride: 72 mmol/L — ABNORMAL LOW (ref 98–111)
Chloride: 101 mmol/L (ref 98–111)
Creatinine, Ser: 0.76 mg/dL (ref 0.61–1.24)
Creatinine, Ser: 1.09 mg/dL (ref 0.61–1.24)
GFR, Estimated: >60 mL/min (ref >60)
GFR, Estimated: >60 mL/min (ref >60)
Glucose, Bld: 72 mg/dL (ref 70–99)
Glucose, Bld: 95 mg/dL (ref 70–99)
Potassium: 2.8 mmol/L — ABNORMAL LOW (ref 3.5–5.1)
Potassium: 3.5 mmol/L (ref 3.5–5.1)
Sodium: 174 mmol/L (ref 135–145)
Sodium: 136 mmol/L (ref 135–145)

## 2020-03-17 LAB — D-DIMER, QUANTITATIVE: D-Dimer, Quant: 0.55 ug/mL-FEU — ABNORMAL HIGH (ref 0.00–0.50)

## 2020-03-17 MED ORDER — DOXYCYCLINE HYCLATE 100 MG PO CAPS: 100.0000 mg | ORAL_CAPSULE | Freq: Two times a day (BID) | ORAL | 0 refills | Status: DC

## 2020-03-17 MED ORDER — NAPROXEN 500 MG PO TABS: 500.0000 mg | ORAL_TABLET | Freq: Two times a day (BID) | ORAL | 0 refills | Status: DC

## 2020-03-17 MED ORDER — DOXYCYCLINE HYCLATE 100 MG PO TABS
100.0000 mg | ORAL_TABLET | Freq: Once | ORAL | Status: AC
  Administered 2020-03-17: 100 mg via ORAL
  Filled 2020-03-17: qty 1

## 2020-03-17 NOTE — ED Notes (Signed)
Date and time results received: 03/17/20 1844  (use smartphrase ".now" to insert current time)  Test: Sodium Sodium  Critical Value: 174  Name of Provider Notified: Dr. Deretha Emory  Orders Received? Or Actions Taken?:

## 2020-03-17 NOTE — ED Provider Notes (Signed)
[facility] EMERGENCY DEPARTMENT Provider Note   CSN: [id number] Arrival date & time: 03/17/20  1651     History Chief Complaint  Patient presents with  . Knee Pain    [name] is a 44 y.o. male.  Patient with a complaint of right knee pain. No known injury to the knee. States has been bothering him for about a month. Also has noted some swelling to his right lower leg area. Denies any shortness of breath or chest pain.        Past Medical History:  Diagnosis Date  . Bipolar disorder (HCC)   . Bronchitis   . Hyperlipidemia   . Paranoid schizophrenia (HCC)   . Schizophrenia [facility])     Patient Active Problem List   Diagnosis Date Noted  . Overdose, intentional self-harm, initial encounter (HCC) 02/24/2020  . Schizophrenia (HCC) 02/24/2020  . Overdose, undetermined intent, initial encounter 02/23/2020  . Overdose 02/23/2020  . Paranoid schizophrenia (HCC) 02/02/2020  . Schizophrenia, paranoid (HCC) 05/31/2019  . Tobacco use disorder   . Schizoaffective disorder, bipolar type (HCC) 06/29/2014    Past Surgical History:  Procedure Laterality Date  . TESTICLE IMPLANTATION TO THIGH    . TESTICLE SURGERY         Family History  Problem Relation Age of Onset  . Schizophrenia Neg Hx     Social History   Tobacco Use  . Smoking status: Current Every Day Smoker    Packs/day: 1.00    Types: Cigarettes  . Smokeless tobacco: Never Used  Vaping Use  . Vaping Use: Never used  Substance Use Topics  . Alcohol use: Not Currently    Comment: weekly  . Drug use: Not Currently    Types: Marijuana    Home Medications Prior to Admission medications   Medication Sig Start Date End Date Taking? Authorizing Provider  benztropine (COGENTIN) 1 MG tablet Take 1 tablet (1 mg total) by mouth 2 (two) times daily. 03/06/20 04/05/20  Estella Husk, MD  doxycycline (VIBRAMYCIN) 100 MG capsule Take 1 capsule (100 mg total) by mouth 2 (two) times daily. 03/17/20    Vanetta Mulders, MD  fluPHENAZine (PROLIXIN) 10 MG tablet Take 1 tablet (10 mg total) by mouth daily. 03/07/20 04/06/20  Estella Husk, MD  fluPHENAZine (PROLIXIN) 5 MG tablet Take 5 tablets (25 mg total) by mouth at bedtime. 03/06/20 04/05/20  Estella Husk, MD  naproxen (NAPROSYN) 500 MG tablet Take 1 tablet (500 mg total) by mouth 2 (two) times daily. 03/17/20   Vanetta Mulders, MD  nicotine (NICODERM CQ - DOSED IN MG/24 HOURS) 21 mg/24hr patch Place 1 patch (21 mg total) onto the skin daily. 03/07/20   Estella Husk, MD  OLANZapine (ZYPREXA) 15 MG tablet Take 2 tablets (30 mg total) by mouth at bedtime. 03/06/20 04/05/20  Estella Husk, MD  OLANZapine (ZYPREXA) 5 MG tablet Take 1 tablet (5 mg total) by mouth daily. 03/07/20 04/06/20  Estella Husk, MD  paliperidone (INVEGA SUSTENNA) 234 MG/1.5ML SUSY injection Inject 234 mg into the muscle every 28 (twenty-eight) days. 03/21/20   Estella Husk, MD    Allergies    Other  Review of Systems   Review of Systems  Constitutional: Negative for chills and fever.  HENT: Negative for congestion, rhinorrhea and sore throat.   Eyes: Negative for visual disturbance.  Respiratory: Negative for cough and shortness of breath.   Cardiovascular: Positive for leg swelling. Negative for chest pain.  Gastrointestinal:  Negative for abdominal pain, diarrhea, nausea and vomiting.  Genitourinary: Negative for dysuria.  Musculoskeletal: Positive for joint swelling. Negative for back pain and neck pain.  Skin: Negative for rash.  Neurological: Negative for dizziness, light-headedness and headaches.  Hematological: Does not bruise/bleed easily.  Psychiatric/Behavioral: Negative for confusion.    Physical Exam Updated Vital Signs BP 131/79   Pulse 90   Temp 98.9 F (37.2 C) (Oral)   Resp 16   Ht 1.854 m (6\' 1" )   Wt 126.5 kg   SpO2 98%   BMI 36.78 kg/m   Physical Exam Vitals and nursing note reviewed.    Constitutional:      Appearance: Normal appearance. He is well-developed.  HENT:     Head: Normocephalic and atraumatic.  Eyes:     Extraocular Movements: Extraocular movements intact.     Conjunctiva/sclera: Conjunctivae normal.     Pupils: Pupils are equal, round, and reactive to light.  Cardiovascular:     Rate and Rhythm: Normal rate and regular rhythm.     Heart sounds: No murmur heard.   Pulmonary:     Effort: Pulmonary effort is normal. No respiratory distress.     Breath sounds: Normal breath sounds.  Abdominal:     Palpations: Abdomen is soft.     Tenderness: There is no abdominal tenderness.  Musculoskeletal:        General: Swelling present. Normal range of motion.     Cervical back: Neck supple.     Comments: Right leg with some edema. And some erythema. Patient with a bulging to the lateral aspect of his right knee. Soft nontender questionable effusion. Some pain with range of motion of the knee. Distally dorsalis pedis pulse 1+. No evidence of any wound.  Skin:    General: Skin is warm and dry.     Capillary Refill: Capillary refill takes less than 2 seconds.  Neurological:     General: No focal deficit present.     Mental Status: He is alert and oriented to person, place, and time.     Cranial Nerves: No cranial nerve deficit.     Sensory: No sensory deficit.     Motor: No weakness.     ED Results / Procedures / Treatments   Labs (all labs ordered are listed, but only abnormal results are displayed) Labs Reviewed  CBC WITH DIFFERENTIAL/PLATELET - Abnormal; Notable for the following components:      Result Value   WBC 11.3 (*)    HCT 38.4 (*)    All other components within normal limits  BASIC METABOLIC PANEL - Abnormal; Notable for the following components:   Sodium 174 (*)    Potassium 2.8 (*)    Chloride 72 (*)    CO2 11 (*)    Calcium <4.0 (*)    Anion gap >20 (*)    All other components within normal limits  D-DIMER, QUANTITATIVE (NOT AT [facility]) -  Abnormal; Notable for the following components:   D-Dimer, Quant 0.55 (*)    All other components within normal limits  BASIC METABOLIC PANEL    EKG None  Radiology [facility] Venous Img Lower Right (DVT Study)  Result Date: 03/17/2020 CLINICAL DATA:  Right lower extremity swelling. EXAM: RIGHT LOWER EXTREMITY VENOUS DOPPLER ULTRASOUND TECHNIQUE: Gray-scale sonography with compression, as well as color and duplex ultrasound, were performed to evaluate the deep venous system(s) from the level of the common femoral vein through the popliteal and proximal calf veins. COMPARISON:  None.  FINDINGS: VENOUS Normal compressibility of the common femoral, superficial femoral, and popliteal veins, as well as the visualized calf veins. Visualized portions of profunda femoral vein and great saphenous vein unremarkable. No filling defects to suggest DVT on grayscale or color Doppler imaging. Doppler waveforms show normal direction of venous flow, normal respiratory plasticity and response to augmentation. Limited views of the contralateral common femoral vein are unremarkable. OTHER None. Limitations: none IMPRESSION: Negative. Electronically Signed   By: Elberta Fortis M.D.   On: 03/17/2020 18:21   DG Knee Complete 4 Views Right  Result Date: 03/17/2020 CLINICAL DATA:  Swelling and pain.  Unaware of injury. EXAM: RIGHT KNEE - COMPLETE 4+ VIEW COMPARISON:  None. FINDINGS: No fractures or dislocations. Probable small joint effusion. Minimal degenerative changes in the lateral patellofemoral compartments. No other acute abnormalities. IMPRESSION: Probable small joint effusion. Minimal degenerative changes. No fractures. Electronically Signed   By: Gerome Sam III M.D   On: 03/17/2020 17:53    Procedures Procedures (including critical care time)  Medications Ordered in ED Medications  doxycycline (VIBRA-TABS) tablet 100 mg (has no administration in time range)    ED Course  I have reviewed the triage vital  signs and the nursing notes.  Pertinent labs & imaging results that were available during my care of the patient were reviewed by me and considered in my medical decision making (see chart for details).    MDM Rules/Calculators/A&P                          Doppler study of the right lower extremity without evidence of any blood clot. D-dimer was slightly elevated. Patient without any chest pain or shortness of breath. Not concerned about pulmonary embolus. Patient's initial labs were markedly abnormal from electrolyte standpoint. Had them repeated and they came back very normal to include normal renal function. Patient with a mild leukocytosis with a white blood cell count of 11,000. Lower extremity suggestive of may be some cellulitis. Will treat with doxycycline. There is some discomfort with range of motion of the right knee. Will treat with knee immobilizer Naprosyn and follow-up with orthopedics. Patient also has follow-up with his primary care doctor scheduled for early December. Patient nontoxic no acute distress    Final Clinical Impression(s) / ED Diagnoses Final diagnoses:  Acute pain of right knee  Cellulitis of right lower extremity    Rx / DC Orders ED Discharge Orders         Ordered    naproxen (NAPROSYN) 500 MG tablet  2 times daily        03/17/20 2021    doxycycline (VIBRAMYCIN) 100 MG capsule  2 times daily        03/17/20 2021           Vanetta Mulders, MD 03/17/20 2028

## 2020-03-17 NOTE — ED Notes (Signed)
Date and time results received: 03/17/20 1844 (use smartphrase ".now" to insert current time)  Test: Calcium Critical Value: Less than 4.0  Name of Provider Notified: Dr. Deretha Emory  Orders Received? Or Actions Taken?:

## 2020-03-17 NOTE — ED Notes (Signed)
While RN was assessing Pts BP, Pt was Negative for Trousseau's sign. RN received report from previous RN stating Blood work would be re-drawn and evaluated.

## 2020-03-17 NOTE — Discharge Instructions (Signed)
The antibiotic doxycycline as directed for the next 7 days.  Take the medication Naprosyn for the swelling and pain in your right knee.  Wear the knee immobilizer at all times.  Can take it off to shower.  Make an appointment to follow-up with orthopedics.  Keep your appointment with your primary care doctor as scheduled for early December.  Return for any new or worse symptoms.

## 2020-03-17 NOTE — ED Triage Notes (Signed)
Pt presented to ED with right knee pain. Unaware of injury to knee. Right knee pain x 30 days. Right lower leg noted larger than lower left leg.

## 2020-03-30 ENCOUNTER — Other Ambulatory Visit: Payer: Medicare HMO

## 2020-03-30 ENCOUNTER — Ambulatory Visit: Payer: Medicare HMO | Admitting: Orthopedic Surgery

## 2020-03-30 DIAGNOSIS — Z20822 Contact with and (suspected) exposure to covid-19: Secondary | ICD-10-CM

## 2020-03-31 LAB — SARS-COV-2, NAA 2 DAY TAT

## 2020-03-31 LAB — NOVEL CORONAVIRUS, NAA: SARS-CoV-2, NAA: NOT DETECTED

## 2020-04-03 ENCOUNTER — Ambulatory Visit (INDEPENDENT_AMBULATORY_CARE_PROVIDER_SITE_OTHER): Payer: Medicare HMO | Admitting: Orthopedic Surgery

## 2020-04-03 ENCOUNTER — Encounter: Payer: Self-pay | Admitting: Orthopedic Surgery

## 2020-04-03 ENCOUNTER — Other Ambulatory Visit: Payer: Self-pay

## 2020-04-03 VITALS — BP 155/91 | HR 113 | Ht 73.0 in | Wt 278.0 lb

## 2020-04-03 DIAGNOSIS — W08XXXA Fall from other furniture, initial encounter: Secondary | ICD-10-CM

## 2020-04-03 DIAGNOSIS — M25461 Effusion, right knee: Secondary | ICD-10-CM | POA: Diagnosis not present

## 2020-04-03 NOTE — Patient Instructions (Signed)
CONTINUE IBUPROFEN   MRI RIGHT KNEE

## 2020-04-03 NOTE — Progress Notes (Signed)
EMERGENCY ROOM FOLLOW UP  NEW PROBLEM/PATIENT   Patient ID: [name], male   DOB: 05-Oct-1975, 44 y.o.   MRN: [id number]  Emergency room record from (date) 03/17/20 has been reviewed and this is included by reference and includes the review of systems with the following addition:   Chief Complaint  Patient presents with  . Knee Pain    right since fall off couch about a month ago     HPI [name] is a 44 y.o. male.  Was normal until he fell off of the sofa a month ago   Since then he has had pain swelling and difficulty bending the knee   The ER doctor says :  Patient with a mild leukocytosis with a white blood cell count of 11,000. Lower extremity suggestive of may be some cellulitis. Will treat with doxycycline. There is some discomfort with range of motion of the right knee. Will treat with knee immobilizer Naprosyn and follow-up with orthopedics. Patient also has follow-up with his primary care doctor scheduled for early December. Patient nontoxic no acute distress   44 h/o mental illness  Was normal until he fell off of the sofa a month ago   Since then he has had pain swelling and difficulty bending the knee   The ER doctor says :  Patient with a mild leukocytosis with a white blood cell count of 11,000. Lower extremity suggestive of may be some cellulitis. Will treat with doxycycline. There is some discomfort with range of motion of the right knee. Will treat with knee immobilizer Naprosyn and follow-up with orthopedics. Patient also has follow-up with his primary care doctor scheduled for early December. Patient nontoxic no acute distress    Review of Systems Review of Systems   has a past medical history of Bipolar disorder (HCC), Bronchitis, Hyperlipidemia, Paranoid schizophrenia (HCC), and Schizophrenia (HCC).   Past Surgical History:  Procedure Laterality Date  . TESTICLE IMPLANTATION TO THIGH    . TESTICLE SURGERY      Family History  Problem Relation  Age of Onset  . Schizophrenia Neg Hx     Social History Social History   Tobacco Use  . Smoking status: Current Every Day Smoker    Packs/day: 1.00    Types: Cigarettes  . Smokeless tobacco: Never Used  Vaping Use  . Vaping Use: Never used  Substance Use Topics  . Alcohol use: Not Currently    Comment: weekly  . Drug use: Not Currently    Types: Marijuana    Allergies  Allergen Reactions  . Other     Some trial medicine from [facility].     Current Outpatient Medications  Medication Sig Dispense Refill  . fluPHENAZine (PROLIXIN) 10 MG tablet Take 1 tablet (10 mg total) by mouth daily. 30 tablet 0  . fluPHENAZine (PROLIXIN) 5 MG tablet Take 5 tablets (25 mg total) by mouth at bedtime. 150 tablet 0  . OLANZapine (ZYPREXA) 15 MG tablet Take 2 tablets (30 mg total) by mouth at bedtime. 60 tablet 0  . OLANZapine (ZYPREXA) 5 MG tablet Take 1 tablet (5 mg total) by mouth daily. 30 tablet 0  . paliperidone (INVEGA SUSTENNA) 234 MG/1.5ML SUSY injection Inject 234 mg into the muscle every 28 (twenty-eight) days. 1.8 mL 0  . benztropine (COGENTIN) 1 MG tablet Take 1 tablet (1 mg total) by mouth 2 (two) times daily. (Patient not taking: Reported on 04/03/2020) 60 tablet 0  . ibuprofen (ADVIL) 800 MG tablet Take  800 mg by mouth 2 (two) times daily as needed.     No current facility-administered medications for this visit.    Physical Exam BP (!) 155/91   Pulse (!) 113   Ht 6\' 1"  (1.854 m)   Wt 278 lb (126.1 kg)   BMI 36.68 kg/m  Body mass index is 36.68 kg/m.  Well developed and well nourished  Stands with normal weight bearing line  Alert and oriented x 3  Normal affect and mood  Ortho Exam The right knee definitely has an effusion is warm to touch no erythema the left is not swollen he has trouble bending and straightening the knee is difficult to tell if this is a knee sprain from a ligament injury or an acute synovitis  Data Reviewed IMAGING From THE ER AND THE REPORT  ARE REVIEWED, MY INTERPRETATION OF THE IMAGE(S) IS :   Assessment  Encounter Diagnosis  Name Primary?  . Effusion, right knee Yes     Plan   MRI RIGHT KNEE to delineate intra-articular issue such as synovitis, ligament injury or meniscal damage   , MD 04/03/2020 2:56 PM

## 2020-04-04 DIAGNOSIS — S91301D Unspecified open wound, right foot, subsequent encounter: Secondary | ICD-10-CM | POA: Diagnosis not present

## 2020-04-04 DIAGNOSIS — S91302D Unspecified open wound, left foot, subsequent encounter: Secondary | ICD-10-CM | POA: Diagnosis not present

## 2020-04-07 ENCOUNTER — Other Ambulatory Visit: Payer: Self-pay

## 2020-04-07 ENCOUNTER — Ambulatory Visit (HOSPITAL_COMMUNITY)
Admission: RE | Admit: 2020-04-07 | Discharge: 2020-04-07 | Disposition: A | Discharge status: Home / Self Care | Payer: Medicare HMO | Source: Ambulatory Visit | Attending: Orthopedic Surgery | Admitting: Orthopedic Surgery

## 2020-04-07 DIAGNOSIS — M25561 Pain in right knee: Secondary | ICD-10-CM | POA: Diagnosis not present

## 2020-04-07 DIAGNOSIS — M25461 Effusion, right knee: Secondary | ICD-10-CM | POA: Diagnosis not present

## 2020-04-10 ENCOUNTER — Ambulatory Visit (INDEPENDENT_AMBULATORY_CARE_PROVIDER_SITE_OTHER): Payer: Medicare HMO | Admitting: Orthopedic Surgery

## 2020-04-10 ENCOUNTER — Other Ambulatory Visit: Payer: Self-pay

## 2020-04-10 VITALS — Ht 73.0 in | Wt 278.0 lb

## 2020-04-10 DIAGNOSIS — M222X1 Patellofemoral disorders, right knee: Secondary | ICD-10-CM | POA: Diagnosis not present

## 2020-04-10 DIAGNOSIS — M8430XA Stress fracture, unspecified site, initial encounter for fracture: Secondary | ICD-10-CM

## 2020-04-10 DIAGNOSIS — M23321 Other meniscus derangements, posterior horn of medial meniscus, right knee: Secondary | ICD-10-CM | POA: Diagnosis not present

## 2020-04-10 DIAGNOSIS — M1711 Unilateral primary osteoarthritis, right knee: Secondary | ICD-10-CM | POA: Diagnosis not present

## 2020-04-10 DIAGNOSIS — M1710 Unilateral primary osteoarthritis, unspecified knee: Secondary | ICD-10-CM

## 2020-04-10 NOTE — Progress Notes (Signed)
Chief Complaint  Patient presents with  . Follow-up    Recheck on right knee   The patient tells me that he had a fall off of the sofa month ago then at that time developed acute pain and swelling and trouble bending his knee he also presented to the ER with a mild leukocytosis and a white count of 11,000   I read his MRI he has a probable radial tear at the root of the posterior horn the medial meniscus he has subchondral stress fractures medial tibial plateau with surrounding edema he also has an osteochondral lesion which is probably old he has a tibial tubercle trochlear groove distance of 1.4 cm  Based on his history most of this is probably acute stress fracture type lesion  medial tibial plateau  [name] says his knee is feeling a little bit better he is walking without any limp or support  We are going to try to treat this nonoperatively set up an appointment for 6 weeks to recheck the knee and make sure his return to his normal state of health  Encounter Diagnoses  Name Primary?  . Derangement of posterior horn of medial meniscus of right knee Yes  . Stress reaction of bone   . Primary localized osteoarthritis of knee   . Patellofemoral disorder, right     Acute complicated problem, read MRI

## 2020-04-11 DIAGNOSIS — S91302D Unspecified open wound, left foot, subsequent encounter: Secondary | ICD-10-CM | POA: Diagnosis not present

## 2020-04-11 DIAGNOSIS — S91301D Unspecified open wound, right foot, subsequent encounter: Secondary | ICD-10-CM | POA: Diagnosis not present

## 2020-04-14 ENCOUNTER — Ambulatory Visit (HOSPITAL_COMMUNITY): Admission: RE | Admit: 2020-04-14 | Payer: Medicare HMO | Source: Ambulatory Visit

## 2020-05-22 ENCOUNTER — Ambulatory Visit: Payer: Medicare Other | Admitting: Orthopedic Surgery

## 2020-06-08 ENCOUNTER — Ambulatory Visit: Payer: Medicare Other | Admitting: Orthopedic Surgery

## 2021-09-04 DIAGNOSIS — F250 Schizoaffective disorder, bipolar type: Secondary | ICD-10-CM | POA: Diagnosis not present

## 2021-09-04 DIAGNOSIS — E785 Hyperlipidemia, unspecified: Secondary | ICD-10-CM | POA: Diagnosis not present

## 2021-09-07 DIAGNOSIS — E785 Hyperlipidemia, unspecified: Secondary | ICD-10-CM | POA: Diagnosis not present

## 2021-09-07 DIAGNOSIS — E7849 Other hyperlipidemia: Secondary | ICD-10-CM | POA: Diagnosis not present

## 2021-09-07 DIAGNOSIS — E119 Type 2 diabetes mellitus without complications: Secondary | ICD-10-CM | POA: Diagnosis not present

## 2021-09-07 DIAGNOSIS — F250 Schizoaffective disorder, bipolar type: Secondary | ICD-10-CM | POA: Diagnosis not present

## 2021-09-17 DIAGNOSIS — Z6838 Body mass index (BMI) 38.0-38.9, adult: Secondary | ICD-10-CM | POA: Diagnosis not present

## 2021-09-17 DIAGNOSIS — E1169 Type 2 diabetes mellitus with other specified complication: Secondary | ICD-10-CM | POA: Diagnosis not present

## 2021-09-17 DIAGNOSIS — Z0000 Encounter for general adult medical examination without abnormal findings: Secondary | ICD-10-CM | POA: Diagnosis not present

## 2021-10-10 ENCOUNTER — Other Ambulatory Visit: Payer: Self-pay

## 2021-10-10 ENCOUNTER — Emergency Department (HOSPITAL_COMMUNITY)
Admission: EM | Admit: 2021-10-10 | Discharge: 2021-10-10 | Disposition: A | Discharge status: Home / Self Care | Payer: Medicare HMO | Attending: Emergency Medicine | Admitting: Emergency Medicine

## 2021-10-10 ENCOUNTER — Encounter (HOSPITAL_COMMUNITY): Payer: Self-pay | Admitting: Emergency Medicine

## 2021-10-10 DIAGNOSIS — Z0279 Encounter for issue of other medical certificate: Secondary | ICD-10-CM | POA: Insufficient documentation

## 2021-10-10 DIAGNOSIS — F22 Delusional disorders: Secondary | ICD-10-CM | POA: Diagnosis not present

## 2021-10-10 DIAGNOSIS — Z79899 Other long term (current) drug therapy: Secondary | ICD-10-CM | POA: Insufficient documentation

## 2021-10-10 DIAGNOSIS — F209 Schizophrenia, unspecified: Secondary | ICD-10-CM | POA: Diagnosis not present

## 2021-10-10 LAB — CBC WITH DIFFERENTIAL/PLATELET
Abs Immature Granulocytes: 0.05 10*3/uL (ref 0.00–0.07)
Basophils Absolute: 0.1 10*3/uL (ref 0.0–0.1)
Basophils Relative: 1 %
Eosinophils Absolute: 0.2 10*3/uL (ref 0.0–0.5)
Eosinophils Relative: 1 %
HCT: 39.8 % (ref 39.0–52.0)
Hemoglobin: 13.4 g/dL (ref 13.0–17.0)
Immature Granulocytes: 1 %
Lymphocytes Relative: 22 %
Lymphs Abs: 2.4 10*3/uL (ref 0.7–4.0)
MCH: 29.7 pg (ref 26.0–34.0)
MCHC: 33.7 g/dL (ref 30.0–36.0)
MCV: 88.2 fL (ref 80.0–100.0)
Monocytes Absolute: 0.7 10*3/uL (ref 0.1–1.0)
Monocytes Relative: 6 %
Neutro Abs: 7.3 10*3/uL (ref 1.7–7.7)
Neutrophils Relative %: 69 %
Platelets: 382 10*3/uL (ref 150–400)
RBC: 4.51 MIL/uL (ref 4.22–5.81)
RDW: 13.3 % (ref 11.5–15.5)
WBC: 10.5 10*3/uL (ref 4.0–10.5)
nRBC: 0 % (ref 0.0–0.2)

## 2021-10-10 LAB — COMPREHENSIVE METABOLIC PANEL
ALT: 25 U/L (ref 0–44)
AST: 29 U/L (ref 15–41)
Albumin: 3.8 g/dL (ref 3.5–5.0)
Alkaline Phosphatase: 72 U/L (ref 38–126)
Anion gap: 3 — ABNORMAL LOW (ref 5–15)
BUN: 18 mg/dL (ref 6–20)
CO2: 26 mmol/L (ref 22–32)
Calcium: 8.4 mg/dL — ABNORMAL LOW (ref 8.9–10.3)
Chloride: 108 mmol/L (ref 98–111)
Creatinine, Ser: 0.92 mg/dL (ref 0.61–1.24)
GFR, Estimated: >60 mL/min (ref >60)
Glucose, Bld: 121 mg/dL — ABNORMAL HIGH (ref 70–99)
Potassium: 3.4 mmol/L — ABNORMAL LOW (ref 3.5–5.1)
Sodium: 137 mmol/L (ref 135–145)
Total Bilirubin: 0.6 mg/dL (ref 0.3–1.2)
Total Protein: 7.2 g/dL (ref 6.5–8.1)

## 2021-10-10 LAB — RAPID URINE DRUG SCREEN, HOSP PERFORMED
Amphetamines: NOT DETECTED
Barbiturates: NOT DETECTED
Benzodiazepines: NOT DETECTED
Cocaine: NOT DETECTED
Opiates: NOT DETECTED
Tetrahydrocannabinol: NOT DETECTED

## 2021-10-10 LAB — ETHANOL: Alcohol, Ethyl (B): <10 mg/dL (ref <10)

## 2021-10-10 MED ORDER — BENZTROPINE MESYLATE 1 MG PO TABS: 1.0000 mg | ORAL_TABLET | Freq: Two times a day (BID) | ORAL | 0 refills | Status: DC

## 2021-10-10 MED ORDER — OLANZAPINE 5 MG PO TABS: 5.0000 mg | ORAL_TABLET | Freq: Every day | ORAL | 0 refills | Status: DC

## 2021-10-10 MED ORDER — OLANZAPINE 15 MG PO TABS: 30.0000 mg | ORAL_TABLET | Freq: Every day | ORAL | 0 refills | Status: DC

## 2021-10-10 MED ORDER — ARIPIPRAZOLE 5 MG PO TABS
10.0000 mg | ORAL_TABLET | Freq: Once | ORAL | Status: AC
  Administered 2021-10-10: 10 mg via ORAL
  Filled 2021-10-10: qty 2

## 2021-10-10 NOTE — ED Provider Notes (Addendum)
[facility] EMERGENCY DEPARTMENT Provider Note   CSN: [id number] Arrival date & time: 10/10/21  0259     History  Chief Complaint  Patient presents with   Medical Clearance    [name] is a 46 y.o. male.  46 yo M here with concern for a snake in his apartment. States he noticed it on the floor. Didn't witness a bite but is concerned. Has h/o schizophrenia and states compliance. Denies SI/HI. On review of the recortds he has had delusional and paranoia issues in the past inovlving his apartment. Attempted to contact mother for corroborating story, however no answer.         Home Medications Prior to Admission medications   Medication Sig Start Date End Date Taking? Authorizing Provider  benztropine (COGENTIN) 1 MG tablet Take 1 tablet (1 mg total) by mouth 2 (two) times daily. Patient not taking: Reported on 04/03/2020 03/06/20 04/05/20  Estella Husk, MD  fluPHENAZine (PROLIXIN) 10 MG tablet Take 1 tablet (10 mg total) by mouth daily. 03/07/20 04/06/20  Estella Husk, MD  fluPHENAZine (PROLIXIN) 5 MG tablet Take 5 tablets (25 mg total) by mouth at bedtime. 03/06/20 04/05/20  Estella Husk, MD  ibuprofen (ADVIL) 800 MG tablet Take 800 mg by mouth 2 (two) times daily as needed. 03/17/20   [provider]  OLANZapine (ZYPREXA) 15 MG tablet Take 2 tablets (30 mg total) by mouth at bedtime. 03/06/20 04/05/20  Estella Husk, MD  OLANZapine (ZYPREXA) 5 MG tablet Take 1 tablet (5 mg total) by mouth daily. 03/07/20 04/06/20  Estella Husk, MD  paliperidone (INVEGA SUSTENNA) 234 MG/1.5ML SUSY injection Inject 234 mg into the muscle every 28 (twenty-eight) days. 03/21/20   Estella Husk, MD      Allergies    Other    Review of Systems   Review of Systems  Physical Exam Updated Vital Signs BP 137/84   Pulse 99   Temp 98.5 F (36.9 C) (Oral)   Resp 17   Ht 6\' 1"  (1.854 m)   Wt 126.1 kg   SpO2 98%   BMI 36.68 kg/m  Physical  Exam Vitals and nursing note reviewed.  Constitutional:      Appearance: He is well-developed.  HENT:     Head: Normocephalic and atraumatic.     Mouth/Throat:     Mouth: Mucous membranes are moist.  Eyes:     Pupils: Pupils are equal, round, and reactive to light.  Cardiovascular:     Rate and Rhythm: Normal rate.  Pulmonary:     Effort: Pulmonary effort is normal. No respiratory distress.  Abdominal:     General: Abdomen is flat. There is no distension.  Musculoskeletal:        General: Normal range of motion.     Cervical back: Normal range of motion.  Skin:    General: Skin is warm and dry.  Neurological:     General: No focal deficit present.     Mental Status: He is alert.     ED Results / Procedures / Treatments   Labs (all labs ordered are listed, but only abnormal results are displayed) Labs Reviewed - No data to display  EKG None  Radiology No results found.  Procedures Procedures    Medications Ordered in ED Medications - No data to display  ED Course/ Medical Decision Making/ A&P  Medical Decision Making Amount and/or Complexity of Data Reviewed Labs: ordered.  Risk Prescription drug management.   Mildly delusional but doesn't appear to be a danger to self at this moment. Will observe for a little while and see how he does.   On reeval, still delusional. Now states he hasn't had his medication this month like he is supposed to because he doesn't have money. This makes the situation more concerning. Will get medical clearance TTS consult.   Patient labs ok. Vitals ok. No indication for further workup. I would consider him medically clered for tts consult and recommendations. Abilify already ordered. If patient can get back on medications and show stability, may be candidate for ED discharge. I would not IVC him at this time, but if he escalates it may be needed.   Final Clinical Impression(s) / ED Diagnoses Final  diagnoses:  None    Rx / DC Orders ED Discharge Orders     None         Afifa Truax, Barbara Cower, MD 10/10/21 8101    Marily Memos, MD 10/10/21 [id number]

## 2021-10-10 NOTE — ED Triage Notes (Signed)
Pt states a snake popped up out of apartment floor. Pt states he does not know if he was bit or not.

## 2021-10-10 NOTE — ED Provider Notes (Signed)
  Physical Exam  BP 137/84   Pulse 99   Temp 98.5 F (36.9 C) (Oral)   Resp 17   Ht 6\' 1"  (1.854 m)   Wt 126.1 kg   SpO2 98%   BMI 36.68 kg/m   Physical Exam Constitutional:      General: He is not in acute distress.    Appearance: Normal appearance.  HENT:     Head: Normocephalic and atraumatic.     Nose: No congestion or rhinorrhea.  Eyes:     General:        Right eye: No discharge.        Left eye: No discharge.     Extraocular Movements: Extraocular movements intact.     Pupils: Pupils are equal, round, and reactive to light.  Cardiovascular:     Rate and Rhythm: Normal rate and regular rhythm.     Heart sounds: No murmur heard. Pulmonary:     Effort: No respiratory distress.     Breath sounds: No wheezing or rales.  Abdominal:     General: There is no distension.     Tenderness: There is no abdominal tenderness.  Musculoskeletal:        General: Normal range of motion.     Cervical back: Normal range of motion.  Skin:    General: Skin is warm and dry.  Neurological:     General: No focal deficit present.     Mental Status: He is alert.     Procedures  Procedures  ED Course / MDM    Medical Decision Making Amount and/or Complexity of Data Reviewed Labs: ordered.  Risk Prescription drug management.   Patient received in handoff.  History of schizophrenia and has been off of his medications.  He arrives voluntary with a fixed delusion of finding a snake in his apartment.  He is otherwise displaying no evidence of psychosis.  He received a dose of Abilify and on reevaluation this morning, is requesting discharge.  He denies SI, HI, AH, VH and is showing no signs of psychosis this morning.  Based on his last psychiatric admission on 02/24/2020, patient is supposed to be on olanzapine 30 mg nightly and 5 mg daily as well as on benztropine for extrapyramidal side effects.  I refilled these 2 medications.  It appears the patient is also supposed to be on an  Invega injection monthly but we are unable to provide this in the emergency department.  Patient was discharged with outpatient follow-up.       Teressa Lower, MD 10/10/21 [phone]

## 2021-10-19 ENCOUNTER — Emergency Department (HOSPITAL_COMMUNITY)
Admission: EM | Admit: 2021-10-19 | Discharge: 2021-10-19 | Disposition: A | Discharge status: Home / Self Care | Payer: Medicare HMO | Attending: Emergency Medicine | Admitting: Emergency Medicine

## 2021-10-19 ENCOUNTER — Other Ambulatory Visit: Payer: Self-pay

## 2021-10-19 ENCOUNTER — Encounter (HOSPITAL_COMMUNITY): Payer: Self-pay

## 2021-10-19 DIAGNOSIS — F17210 Nicotine dependence, cigarettes, uncomplicated: Secondary | ICD-10-CM | POA: Insufficient documentation

## 2021-10-19 DIAGNOSIS — M25561 Pain in right knee: Secondary | ICD-10-CM

## 2021-10-19 MED ORDER — NAPROXEN 500 MG PO TABS
500.0000 mg | ORAL_TABLET | Freq: Two times a day (BID) | ORAL | 0 refills | Status: DC
Start: 2021-10-19 — End: 2021-10-29

## 2021-10-19 MED ORDER — NAPROXEN 250 MG PO TABS
500.0000 mg | ORAL_TABLET | Freq: Once | ORAL | Status: AC
  Administered 2021-10-19: 500 mg via ORAL
  Filled 2021-10-19: qty 2

## 2021-10-19 NOTE — ED Triage Notes (Signed)
Pt presents with R knee pain that started today. Ambulatory to room.

## 2021-10-23 ENCOUNTER — Encounter (HOSPITAL_COMMUNITY): Payer: Self-pay | Admitting: Emergency Medicine

## 2021-10-23 ENCOUNTER — Other Ambulatory Visit: Payer: Self-pay

## 2021-10-23 ENCOUNTER — Emergency Department (HOSPITAL_COMMUNITY)
Admission: EM | Admit: 2021-10-23 | Discharge: 2021-10-23 | Disposition: A | Discharge status: Home / Self Care | Payer: Medicare HMO | Attending: Emergency Medicine | Admitting: Emergency Medicine

## 2021-10-23 DIAGNOSIS — M25461 Effusion, right knee: Secondary | ICD-10-CM | POA: Diagnosis not present

## 2021-10-23 DIAGNOSIS — M25561 Pain in right knee: Secondary | ICD-10-CM | POA: Diagnosis present

## 2021-10-23 MED ORDER — MELOXICAM 7.5 MG PO TABS: 7.5000 mg | ORAL_TABLET | Freq: Two times a day (BID) | ORAL | 0 refills | Status: AC | PRN

## 2021-10-26 DIAGNOSIS — I10 Essential (primary) hypertension: Secondary | ICD-10-CM | POA: Diagnosis not present

## 2021-10-26 DIAGNOSIS — E1169 Type 2 diabetes mellitus with other specified complication: Secondary | ICD-10-CM | POA: Diagnosis not present

## 2021-10-28 ENCOUNTER — Emergency Department (HOSPITAL_COMMUNITY)
Admission: EM | Admit: 2021-10-28 | Discharge: 2021-10-28 | Discharge status: Against Medical Advice | Payer: Medicare Other | Attending: Emergency Medicine | Admitting: Emergency Medicine

## 2021-10-28 ENCOUNTER — Other Ambulatory Visit: Payer: Self-pay

## 2021-10-28 ENCOUNTER — Emergency Department (HOSPITAL_COMMUNITY): Payer: Medicare Other

## 2021-10-28 ENCOUNTER — Encounter (HOSPITAL_COMMUNITY): Payer: Self-pay

## 2021-10-28 DIAGNOSIS — Y900 Blood alcohol level of less than 20 mg/100 ml: Secondary | ICD-10-CM | POA: Diagnosis not present

## 2021-10-28 DIAGNOSIS — Z20822 Contact with and (suspected) exposure to covid-19: Secondary | ICD-10-CM | POA: Insufficient documentation

## 2021-10-28 DIAGNOSIS — S60511A Abrasion of right hand, initial encounter: Secondary | ICD-10-CM | POA: Insufficient documentation

## 2021-10-28 DIAGNOSIS — R9431 Abnormal electrocardiogram [ECG] [EKG]: Secondary | ICD-10-CM | POA: Diagnosis not present

## 2021-10-28 DIAGNOSIS — R4182 Altered mental status, unspecified: Secondary | ICD-10-CM | POA: Diagnosis present

## 2021-10-28 DIAGNOSIS — X58XXXA Exposure to other specified factors, initial encounter: Secondary | ICD-10-CM | POA: Diagnosis not present

## 2021-10-28 DIAGNOSIS — S61411A Laceration without foreign body of right hand, initial encounter: Secondary | ICD-10-CM | POA: Diagnosis not present

## 2021-10-28 DIAGNOSIS — F200 Paranoid schizophrenia: Secondary | ICD-10-CM | POA: Insufficient documentation

## 2021-10-28 LAB — CBC
HCT: 42.1 % (ref 39.0–52.0)
Hemoglobin: 14.3 g/dL (ref 13.0–17.0)
MCH: 29.9 pg (ref 26.0–34.0)
MCHC: 34 g/dL (ref 30.0–36.0)
MCV: 88.1 fL (ref 80.0–100.0)
Platelets: 408 10*3/uL — ABNORMAL HIGH (ref 150–400)
RBC: 4.78 MIL/uL (ref 4.22–5.81)
RDW: 13 % (ref 11.5–15.5)
WBC: 12.2 10*3/uL — ABNORMAL HIGH (ref 4.0–10.5)
nRBC: 0 % (ref 0.0–0.2)

## 2021-10-28 LAB — COMPREHENSIVE METABOLIC PANEL
ALT: 74 U/L — ABNORMAL HIGH (ref 0–44)
AST: 108 U/L — ABNORMAL HIGH (ref 15–41)
Albumin: 4.6 g/dL (ref 3.5–5.0)
Alkaline Phosphatase: 80 U/L (ref 38–126)
Anion gap: 15 (ref 5–15)
BUN: 22 mg/dL — ABNORMAL HIGH (ref 6–20)
CO2: 22 mmol/L (ref 22–32)
Calcium: 9.5 mg/dL (ref 8.9–10.3)
Chloride: 104 mmol/L (ref 98–111)
Creatinine, Ser: 1.12 mg/dL (ref 0.61–1.24)
GFR, Estimated: >60 mL/min (ref >60)
Glucose, Bld: 110 mg/dL — ABNORMAL HIGH (ref 70–99)
Potassium: 3.2 mmol/L — ABNORMAL LOW (ref 3.5–5.1)
Sodium: 141 mmol/L (ref 135–145)
Total Bilirubin: 0.9 mg/dL (ref 0.3–1.2)
Total Protein: 8.5 g/dL — ABNORMAL HIGH (ref 6.5–8.1)

## 2021-10-28 LAB — URINALYSIS, ROUTINE W REFLEX MICROSCOPIC
Glucose, UA: NEGATIVE mg/dL
Hgb urine dipstick: NEGATIVE
Ketones, ur: 5 mg/dL — AB
Leukocytes,Ua: NEGATIVE
Nitrite: NEGATIVE
Protein, ur: 100 mg/dL — AB
Specific Gravity, Urine: 1.035 — ABNORMAL HIGH (ref 1.005–1.030)
pH: 5 (ref 5.0–8.0)

## 2021-10-28 LAB — ETHANOL: Alcohol, Ethyl (B): <10 mg/dL (ref <10)

## 2021-10-28 LAB — RAPID URINE DRUG SCREEN, HOSP PERFORMED
Amphetamines: NOT DETECTED
Barbiturates: NOT DETECTED
Benzodiazepines: NOT DETECTED
Cocaine: NOT DETECTED
Opiates: NOT DETECTED
Tetrahydrocannabinol: NOT DETECTED

## 2021-10-28 LAB — SALICYLATE LEVEL: Salicylate Lvl: <7 mg/dL — ABNORMAL LOW (ref 7.0–30.0)

## 2021-10-28 NOTE — ED Triage Notes (Addendum)
Pt dropped off by [facility], but they left without providing any information. Pt talking incoherently with word salad. Pt states he hears voices from "[place]." Pt denies that the voices are command. Denies SI/HI.

## 2021-10-28 NOTE — ED Notes (Signed)
Pt ambulated out of hospital in hospital scrubs without belongings. Edp aware. Pt not IVC.

## 2021-10-28 NOTE — ED Notes (Signed)
Pt denies needing to provide urine sample at this time .
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
## 2021-10-28 NOTE — BH Assessment (Signed)
Clinician attempted to complete pt's MH Assessment but the calls made on the Tele-Assessment machine (at 2213, 2217, and 2221) were not answered. TTS to attempt to complete pt's MH Assessment at a later time.

## 2021-10-29 ENCOUNTER — Encounter (HOSPITAL_COMMUNITY): Payer: Self-pay | Admitting: *Deleted

## 2021-10-29 ENCOUNTER — Emergency Department (HOSPITAL_COMMUNITY)
Admission: EM | Admit: 2021-10-29 | Discharge: 2021-10-29 | Disposition: A | Discharge status: Home / Self Care | Payer: Medicare Other | Attending: Emergency Medicine | Admitting: Emergency Medicine

## 2021-10-29 DIAGNOSIS — Z23 Encounter for immunization: Secondary | ICD-10-CM | POA: Diagnosis not present

## 2021-10-29 DIAGNOSIS — Z760 Encounter for issue of repeat prescription: Secondary | ICD-10-CM | POA: Diagnosis not present

## 2021-10-29 MED ORDER — TETANUS-DIPHTH-ACELL PERTUSSIS 5-2.5-18.5 LF-MCG/0.5 IM SUSY
0.5000 mL | PREFILLED_SYRINGE | Freq: Once | INTRAMUSCULAR | Status: AC
  Administered 2021-10-29: 0.5 mL via INTRAMUSCULAR
  Filled 2021-10-29: qty 0.5

## 2021-10-29 MED ORDER — BACITRACIN ZINC 500 UNIT/GM EX OINT
TOPICAL_OINTMENT | Freq: Once | CUTANEOUS | Status: AC
  Filled 2021-10-29: qty 0.9

## 2021-10-29 MED ORDER — NAPROXEN 500 MG PO TABS: 500.0000 mg | ORAL_TABLET | Freq: Two times a day (BID) | ORAL | 0 refills | Status: DC
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
## 2021-10-29 NOTE — BH Assessment (Signed)
Comprehensive Clinical Assessment (CCA) Note  10/29/2021 [name] [id number]  Discharge Disposition: Quintella Reichert, NP, reviewed pt's chart and information and determined pt meets inpatient criteria. [name] referral information will be faxed out to multiple hospitals, including [facility], for potential placement. Of note, pt left the ED prior to clinician providing disposition. If pt returns, this information will be relayed to his team.  The patient demonstrates the following risk factors for suicide: Chronic risk factors for suicide include: psychiatric disorder of Schizophrenia and previous suicide attempts via overdose, unsure when . Acute risk factors for suicide include:  UTA . Protective factors for this patient include:  UTA . Considering these factors, the overall suicide risk at this point appears to be none. Patient is not appropriate for outpatient follow up.  Therefore, no sitter is recommended for suicide precautions.  [place] from 10/28/2021 in [place] ED from 10/23/2021 in [place] ED from 10/19/2021 in [place] No Risk No Risk No Risk     Chief Complaint:  Chief Complaint  Patient presents with   Psychiatric Evaluation        Paranoid   Visit Diagnosis: Schizophrenia  CCA Screening, Triage and Referral (STR) [name] is a 46 year old patient who [facility] due to paranoia. Pt states he was brought to the ED tonight, "'Cause I tried to say [address] was drinking too much. I gotta move to the top (apartment) because my telekinesis is getting stuff from the apartment below me. Stuff's coming up."   Pt denies current SI, though he acknowledges he attempted to kill himself by o/d in the past; pt is unable to identify when this occurred. Pt denies he has a plan to kill himself. Pt denies HI. In regards to [place], pt states, "I can see the future a lot. I have  dejavu." Of note, pt was sharing information that was not asked and had a difficult time answering the questions posed. [name] speech was pressured and he had difficulties focusing.  [name] orientation and memory are [place]. Pt was cooperative, though, as noted, he had difficulties answering the questions posed. [name] insight judgement, and impulse control is poor at this time.  Patient Reported Information How did you hear about [place]? -- (UTA - varying information in notes, pt unable to answer)  What Is the Reason for Your Visit/Call Today? Pt states he was brought to the ED tonight, "'Cause I tried to say [address] was drinking too much. I gotta move to the top (apartment) because my telekinesis is getting stuff from the apartment below me. Stuff's coming up." Pt denies current SI, though he acknowledges he attempted to kill himself by o/d in the past; pt is unable to identify when this occurred. Pt denies he has a plan to kill himself. Pt denies HI. In regards to [place], pt states, "I can see the future a lot. I have dejavu." Of note, pt was sharing information that was not asked and had a difficult time answering the questions posed. [name] speech was pressured and had difficulties focusing.  How Long Has This Been Causing You Problems? -- (UTA)  What Do You Feel Would Help You the Most Today? -- (UTA)   Have You Recently Had Any Thoughts About Hurting Yourself? No  Are You Planning to Commit Suicide/Harm Yourself At This time? No   Have you Recently Had Thoughts About [place]? No  Are You Planning to Harm Someone at This Time? No  Explanation: No data recorded  Have You Used Any Alcohol or Drugs in the Past 24 Hours? -- (UTA; pt's UDA and EtOH level were negative for substances)  How Long Ago Did You Use Drugs or Alcohol? No data recorded What Did You Use and How Much? No data recorded  Do You Currently Have a Therapist/Psychiatrist? -- (UTA)  Name of Therapist/Psychiatrist: No  data recorded  Have You Been Recently Discharged From Any Office Practice or Programs? -- (UTA)  Explanation of Discharge From Practice/Program: No data recorded    CCA Screening Triage Referral Assessment Type of Contact: Tele-Assessment  Telemedicine Service Delivery: Telemedicine service delivery: This service was provided via telemedicine using a 2-way, interactive audio and video technology  Is this Initial or Reassessment? Initial Assessment  Date Telepsych consult ordered in [facility]:  10/28/21  Time Telepsych consult ordered in [facility]:  2238  Location of Assessment: [facility]  Provider Location: [facility] Assessment Services   Collateral Involvement: Pt declined to provide verbal consent for clinician to make contact with friends/family   Does Patient Have a Hollins? No data recorded Name and Contact of Legal Guardian: No data recorded If Minor and Not Living with Parent(s), Who has Custody? N/A  Is CPS involved or ever been involved? Never (Per chart)  Is APS involved or ever been involved? Never (Per chart)   Patient Determined To Be At Risk for Harm To Self or Others Based on Review of Patient Reported Information or Presenting Complaint? No  Method: No data recorded Availability of Means: No data recorded Intent: No data recorded Notification Required: No data recorded Additional Information for Danger to Others Potential: No data recorded Additional Comments for Danger to Others Potential: No data recorded Are There Guns or Other Weapons in Your Home? No data recorded Types of Guns/Weapons: No data recorded Are These Weapons Safely Secured?                            No data recorded Who Could Verify You Are Able To Have These Secured: No data recorded Do You Have any Outstanding Charges, Pending Court Dates, Parole/Probation? No data recorded Contacted To Inform of Risk of Harm To Self or Others: -- (N/A)    Does Patient Present under  Involuntary Commitment? No  IVC Papers Initial File Date: No data recorded  [place] of Residence: [place]   Patient Currently Receiving the Following Services: -- (UTA)   Determination of Need: Emergent (2 hours)   Options For Referral: Outpatient Therapy; Inpatient Hospitalization; Medication Management     CCA Biopsychosocial Patient Reported Schizophrenia/Schizoaffective Diagnosis in Past: Yes   Strengths: Pt is his own guardian. He denies SI or HI.   Mental Health Symptoms Depression:   -- (UTA)   Duration of Depressive symptoms:    Mania:   -- (UTA)   Anxiety:    -- (UTA)   Psychosis:   Delusions   Duration of Psychotic symptoms:  Duration of Psychotic Symptoms: -- (UTA)   Trauma:   -- (UTA)   Obsessions:   -- (UTA)   Compulsions:   -- (UTA)   Inattention:   -- (UTA)   Hyperactivity/Impulsivity:   -- (UTA)   Oppositional/Defiant Behaviors:   -- (UTA)   Emotional Irregularity:   Transient, stress-related paranoia/disassociation   Other Mood/Personality Symptoms:   UTA    Mental Status Exam  Appearance and self-care  Stature:   Average   Weight:   Average weight   Clothing:   -- [facility] scrubs)   Grooming:   Normal   Cosmetic use:   None   Posture/gait:   Normal   Motor activity:   Not Remarkable   Sensorium  Attention:   Distractible   Concentration:   Scattered   Orientation:   -- (UTA)   Recall/memory:   -- (UTA)   Affect and Mood  Affect:   Flat   Mood:   Euthymic   Relating  Eye contact:   Fleeting   Facial expression:   Responsive   Attitude toward examiner:   Cooperative   Thought and Language  Speech flow:  Flight of Ideas; Pressured   Thought content:   Delusions   Preoccupation:   None   Hallucinations:   Other (Comment) (Pt states he can see the future, though notes no AVH)   Organization:  No data recorded  [employer] of Knowledge:   Fair    Intelligence:   Below average   Abstraction:   Abstract   Judgement:   Impaired   Reality Testing:   Distorted   Insight:   Lacking   Decision Making:   Impulsive   Social Functioning  Social Maturity:   Impulsive   Social Judgement:   -- Industrial/product designer)   Stress  Stressors:   -- (UTA)   Coping Ability:   Overwhelmed   Skill Deficits:   Interpersonal; Decision making; Communication; Self-control   Supports:   Family     Religion: Religion/Spirituality Are You A Religious Person?:  (UTA) How Might This Affect Treatment?: UTA  Leisure/Recreation: Leisure / Recreation Do You Have Hobbies?:  (UTA)  Exercise/Diet: Exercise/Diet Do You Exercise?:  (UTA) What Type of Exercise Do You Do?:  (UTA) How Many Times a Week Do You Exercise?:  (UTA) Have You Gained or Lost A Significant Amount of Weight in the Past Six Months?:  (UTA) Do You Follow a Special Diet?:  (UTA) Do You Have Any Trouble Sleeping?:  (UTA) Explanation of Sleeping Difficulties: UTA   CCA Employment/Education Employment/Work Situation: Employment / Work Situation Employment Situation: On disability Why is Patient on Disability: Schizophrenia  How Long has Patient Been on Disability: Per chart, since 1998 Patient's Job has Been Impacted by Current Illness: No Has Patient ever Been in the [employer]?: No (Per chart)  Education: Education Is Patient Currently Attending School?:  (UTA) Last Grade Completed:  (UTA) Did You Attend College?:  (UTA) Did You Have An Individualized Education Program (IIEP):  (UTA) Did You Have Any Difficulty At School?:  (UTA) Patient's Education Has Been Impacted by Current Illness:  (UTA)   CCA Family/Childhood History Family and Relationship History: Family history Does patient have children?:  (UTA)  Childhood History:  Childhood History By whom was/is the patient raised?: Both parents (Per chart) Did patient suffer any verbal/emotional/physical/sexual  abuse as a child?: No (Per chart) Did patient suffer from severe childhood neglect?:  (UTA) Has patient ever been sexually abused/assaulted/raped as an adolescent or adult?:  (UTA) Was the patient ever a victim of a crime or a disaster?:  (UTA) Witnessed domestic violence?: No (Per chart) Has patient been affected by domestic violence as an adult?:  Industrial/product designer)  Child/Adolescent Assessment:     CCA Substance Use Alcohol/Drug Use: Alcohol / Drug Use Pain Medications: See MAR Prescriptions: See MAR Over the Counter: See MAR History of alcohol / drug use?:  (  UTA, though pt's UDA was negative and his BAL was N/A) Longest period of sobriety (when/how long): patient states that he has been free of alcohol and dugs for the past 6-7 years Negative Consequences of Use:  (UTA, though pt's UDA was negative and his BAL was N/A) Withdrawal Symptoms:  (UTA, though pt's UDA was negative and his BAL was N/A)                         ASAM's:  Six Dimensions of Multidimensional Assessment  Dimension 1:  Acute Intoxication and/or Withdrawal Potential:      Dimension 2:  Biomedical Conditions and Complications:      Dimension 3:  Emotional, Behavioral, or Cognitive Conditions and Complications:     Dimension 4:  Readiness to Change:     Dimension 5:  Relapse, Continued use, or Continued Problem Potential:     Dimension 6:  Recovery/Living Environment:     ASAM Severity Score:    ASAM Recommended Level of Treatment: ASAM Recommended Level of Treatment:  (UTA, though pt's UDA was negative and his BAL was N/A)   Substance use Disorder (SUD) Substance Use Disorder (SUD)  Checklist Symptoms of Substance Use:  (UTA, though pt's UDA was negative and his BAL was N/A)  Recommendations for Services/Supports/Treatments: Recommendations for Services/Supports/Treatments Recommendations For Services/Supports/Treatments: Individual Therapy, Medication Management, Inpatient Hospitalization  Discharge  Disposition: Roselyn Bering, NP, reviewed pt's chart and information and determined pt meets inpatient criteria. [name] referral information will be faxed out to multiple hospitals, including [facility], for potential placement. Of note, pt left the ED prior to clinician providing disposition. If pt returns, this information will be relayed to his team.  DSM5 Diagnoses: Patient Active Problem List   Diagnosis Date Noted   Overdose, intentional self-harm, initial encounter (HCC) 02/24/2020   Schizophrenia (HCC) 02/24/2020   Overdose, undetermined intent, initial encounter 02/23/2020   Overdose 02/23/2020   Paranoid schizophrenia (HCC) 02/02/2020   Schizophrenia, paranoid (HCC) 05/31/2019   Tobacco use disorder    Schizoaffective disorder, bipolar type (HCC) 06/29/2014     Referrals to Alternative Service(s): Referred to Alternative Service(s):   Place:   Date:   Time:    Referred to Alternative Service(s):   Place:   Date:   Time:    Referred to Alternative Service(s):   Place:   Date:   Time:    Referred to Alternative Service(s):   Place:   Date:   Time:     Ralph Dowdy, LMFT

## 2021-10-29 NOTE — Discharge Instructions (Signed)
Take the naproxen as needed for pain and discomfort.  Continue medications and follow-up with your mental health provider.

## 2021-11-02 ENCOUNTER — Encounter (HOSPITAL_COMMUNITY): Payer: Self-pay | Admitting: *Deleted

## 2021-11-02 ENCOUNTER — Emergency Department (HOSPITAL_COMMUNITY)
Admission: EM | Admit: 2021-11-02 | Discharge: 2021-11-03 | Disposition: A | Discharge status: Other Acute Inpatient Hospital | Payer: Medicare Other | Attending: Emergency Medicine | Admitting: Emergency Medicine

## 2021-11-02 ENCOUNTER — Other Ambulatory Visit: Payer: Self-pay

## 2021-11-02 DIAGNOSIS — Z79899 Other long term (current) drug therapy: Secondary | ICD-10-CM | POA: Insufficient documentation

## 2021-11-02 DIAGNOSIS — F200 Paranoid schizophrenia: Secondary | ICD-10-CM | POA: Diagnosis not present

## 2021-11-02 DIAGNOSIS — F259 Schizoaffective disorder, unspecified: Secondary | ICD-10-CM

## 2021-11-02 DIAGNOSIS — Z20822 Contact with and (suspected) exposure to covid-19: Secondary | ICD-10-CM | POA: Insufficient documentation

## 2021-11-02 LAB — COMPREHENSIVE METABOLIC PANEL
ALT: 91 U/L — ABNORMAL HIGH (ref 0–44)
AST: 103 U/L — ABNORMAL HIGH (ref 15–41)
Albumin: 4 g/dL (ref 3.5–5.0)
Alkaline Phosphatase: 75 U/L (ref 38–126)
Anion gap: 7 (ref 5–15)
BUN: 18 mg/dL (ref 6–20)
CO2: 31 mmol/L (ref 22–32)
Calcium: 9 mg/dL (ref 8.9–10.3)
Chloride: 98 mmol/L (ref 98–111)
Creatinine, Ser: 0.86 mg/dL (ref 0.61–1.24)
GFR, Estimated: >60 mL/min (ref >60)
Glucose, Bld: 145 mg/dL — ABNORMAL HIGH (ref 70–99)
Potassium: 3.8 mmol/L (ref 3.5–5.1)
Sodium: 136 mmol/L (ref 135–145)
Total Bilirubin: 0.5 mg/dL (ref 0.3–1.2)
Total Protein: 7.7 g/dL (ref 6.5–8.1)

## 2021-11-02 LAB — RAPID URINE DRUG SCREEN, HOSP PERFORMED
Amphetamines: NOT DETECTED
Barbiturates: NOT DETECTED
Benzodiazepines: NOT DETECTED
Cocaine: NOT DETECTED
Opiates: NOT DETECTED
Tetrahydrocannabinol: NOT DETECTED

## 2021-11-02 LAB — CBC WITH DIFFERENTIAL/PLATELET
Abs Immature Granulocytes: 0.03 10*3/uL (ref 0.00–0.07)
Basophils Absolute: 0.1 10*3/uL (ref 0.0–0.1)
Basophils Relative: 1 %
Eosinophils Absolute: 0.3 10*3/uL (ref 0.0–0.5)
Eosinophils Relative: 2 %
HCT: 40.3 % (ref 39.0–52.0)
Hemoglobin: 13.5 g/dL (ref 13.0–17.0)
Immature Granulocytes: 0 %
Lymphocytes Relative: 20 %
Lymphs Abs: 2.1 10*3/uL (ref 0.7–4.0)
MCH: 29.5 pg (ref 26.0–34.0)
MCHC: 33.5 g/dL (ref 30.0–36.0)
MCV: 88 fL (ref 80.0–100.0)
Monocytes Absolute: 0.7 10*3/uL (ref 0.1–1.0)
Monocytes Relative: 6 %
Neutro Abs: 7.4 10*3/uL (ref 1.7–7.7)
Neutrophils Relative %: 71 %
Platelets: 357 10*3/uL (ref 150–400)
RBC: 4.58 MIL/uL (ref 4.22–5.81)
RDW: 12.5 % (ref 11.5–15.5)
WBC: 10.5 10*3/uL (ref 4.0–10.5)
nRBC: 0 % (ref 0.0–0.2)

## 2021-11-02 LAB — RESP PANEL BY RT-PCR (FLU A&B, COVID) ARPGX2
Influenza A by PCR: NEGATIVE
Influenza B by PCR: NEGATIVE
SARS Coronavirus 2 by RT PCR: NEGATIVE

## 2021-11-02 LAB — ETHANOL: Alcohol, Ethyl (B): <10 mg/dL (ref <10)

## 2021-11-02 MED ORDER — ACETAMINOPHEN 500 MG PO TABS
1000.0000 mg | ORAL_TABLET | Freq: Once | ORAL | Status: AC
  Administered 2021-11-02: 1000 mg via ORAL
  Filled 2021-11-02: qty 2

## 2021-11-02 MED ORDER — OLANZAPINE 5 MG PO TBDP: 10.0000 mg | ORAL_TABLET | Freq: Three times a day (TID) | ORAL | Status: DC | PRN

## 2021-11-02 MED ORDER — STERILE WATER FOR INJECTION IJ SOLN
INTRAMUSCULAR | Status: AC
  Administered 2021-11-02: 1.2 mL
  Filled 2021-11-02: qty 10

## 2021-11-02 MED ORDER — ZIPRASIDONE MESYLATE 20 MG IM SOLR
20.0000 mg | Freq: Once | INTRAMUSCULAR | Status: AC | PRN
  Administered 2021-11-02: 20 mg via INTRAMUSCULAR
  Filled 2021-11-02: qty 20

## 2021-11-02 MED ORDER — ZIPRASIDONE MESYLATE 20 MG IM SOLR
10.0000 mg | Freq: Once | INTRAMUSCULAR | Status: AC
Start: 2021-11-02 — End: 2021-11-02
  Administered 2021-11-02: 10 mg via INTRAMUSCULAR
  Filled 2021-11-02: qty 20

## 2021-11-02 MED ORDER — LORAZEPAM 1 MG PO TABS
2.0000 mg | ORAL_TABLET | Freq: Once | ORAL | Status: AC
Start: 2021-11-02 — End: 2021-11-02
  Administered 2021-11-02: 2 mg via ORAL
  Filled 2021-11-02: qty 2

## 2021-11-02 MED ORDER — ZIPRASIDONE MESYLATE 20 MG IM SOLR: 20.0000 mg | INTRAMUSCULAR | Status: DC | PRN

## 2021-11-02 MED ORDER — BENZTROPINE MESYLATE 1 MG PO TABS
0.5000 mg | ORAL_TABLET | Freq: Every day | ORAL | Status: DC
  Administered 2021-11-02: 0.5 mg via ORAL
  Filled 2021-11-02: qty 1

## 2021-11-02 MED ORDER — STERILE WATER FOR INJECTION IJ SOLN
INTRAMUSCULAR | Status: AC
  Administered 2021-11-02: 10 mL
  Filled 2021-11-02: qty 10

## 2021-11-02 MED ORDER — LORAZEPAM 1 MG PO TABS: 1.0000 mg | ORAL_TABLET | ORAL | Status: DC | PRN

## 2021-11-02 MED ORDER — OLANZAPINE 5 MG PO TBDP
15.0000 mg | ORAL_TABLET | Freq: Every day | ORAL | Status: DC
  Administered 2021-11-02: 15 mg via ORAL
  Filled 2021-11-02: qty 3

## 2021-11-02 NOTE — ED Notes (Signed)
[facility] has accepted pt and can receive pt after 08:00AM on 7/8. Accepting MD is Landry Mellow. RN report to be called to [phone] prior to transport.

## 2021-11-02 NOTE — ED Provider Notes (Signed)
[facility] EMERGENCY DEPARTMENT Provider Note   CSN: [id number] Arrival date & time: 11/02/21  1324     History  Chief Complaint  Patient presents with   V70.1    [name] is a 46 y.o. male.  Patient has a history of schizophrenia.  He has not been taking his medicines for a while.  He has been delusional and destroyed his apartment and was brought in by the police   Altered Mental Status Presenting symptoms: behavior changes   Severity:  Moderate Most recent episode:  More than 2 days ago Episode history:  Continuous Timing:  Constant Progression:  Worsening Chronicity:  Recurrent Context: not alcohol use   Associated symptoms: no abdominal pain        Home Medications Prior to Admission medications   Medication Sig Start Date End Date Taking? Authorizing Provider  benztropine (COGENTIN) 1 MG tablet Take 1 tablet (1 mg total) by mouth 2 (two) times daily. 10/10/21 11/09/21  [name], Madison, MD  fluPHENAZine (PROLIXIN) 10 MG tablet Take 1 tablet (10 mg total) by mouth daily. 03/07/20 04/06/20  Estella Husk, MD  fluPHENAZine (PROLIXIN) 5 MG tablet Take 5 tablets (25 mg total) by mouth at bedtime. 03/06/20 04/05/20  Estella Husk, MD  ibuprofen (ADVIL) 800 MG tablet Take 800 mg by mouth 2 (two) times daily as needed. 03/17/20   [provider]  meloxicam (MOBIC) 7.5 MG tablet Take 1 tablet (7.5 mg total) by mouth 2 (two) times daily as needed for up to 14 days for pain. 10/23/21 11/06/21  Eber Hong, MD  naproxen (NAPROSYN) 500 MG tablet Take 1 tablet (500 mg total) by mouth 2 (two) times daily. 10/29/21   Linwood Dibbles, MD  OLANZapine (ZYPREXA) 15 MG tablet Take 2 tablets (30 mg total) by mouth at bedtime. 10/10/21 11/09/21  [name], Madison, MD  OLANZapine (ZYPREXA) 5 MG tablet Take 1 tablet (5 mg total) by mouth daily. 10/10/21 11/09/21  [name], Madison, MD  paliperidone (INVEGA SUSTENNA) 234 MG/1.5ML SUSY injection Inject 234 mg into the muscle every 28  (twenty-eight) days. 03/21/20   Estella Husk, MD      Allergies    Other    Review of Systems   Review of Systems  Unable to perform ROS: Mental status change  Gastrointestinal:  Negative for abdominal pain.    Physical Exam Updated Vital Signs BP 126/87 (BP Location: Left Arm)   Pulse 89   Resp 17   Ht 6\' 1"  (1.854 m)   Wt 119.4 kg   SpO2 97%   BMI 34.73 kg/m  Physical Exam Vitals and nursing note reviewed.  Constitutional:      Appearance: He is well-developed.  HENT:     Head: Normocephalic.     Nose: Nose normal.  Eyes:     General: No scleral icterus.    Conjunctiva/sclera: Conjunctivae normal.  Neck:     Thyroid: No thyromegaly.  Cardiovascular:     Rate and Rhythm: Normal rate and regular rhythm.     Heart sounds: No murmur heard.    No friction rub. No gallop.  Pulmonary:     Breath sounds: No stridor. No wheezing or rales.  Chest:     Chest wall: No tenderness.  Abdominal:     General: There is no distension.     Tenderness: There is no abdominal tenderness. There is no rebound.  Musculoskeletal:        General: Normal range of motion.  Cervical back: Neck supple.  Lymphadenopathy:     Cervical: No cervical adenopathy.  Skin:    Findings: No erythema or rash.  Neurological:     Mental Status: He is alert and oriented to person, place, and time.     Motor: No abnormal muscle tone.     Coordination: Coordination normal.  Psychiatric:     Comments: Patient with delusional thoughts and auditory hallucination     ED Results / Procedures / Treatments   Labs (all labs ordered are listed, but only abnormal results are displayed) Labs Reviewed  COMPREHENSIVE METABOLIC PANEL - Abnormal; Notable for the following components:      Result Value   Glucose, Bld 145 (*)    AST 103 (*)    ALT 91 (*)    All other components within normal limits  RESP PANEL BY RT-PCR (FLU A&B, COVID) ARPGX2  CBC WITH DIFFERENTIAL/PLATELET  ETHANOL  RAPID  URINE DRUG SCREEN, HOSP PERFORMED    EKG None  Radiology No results found.  Procedures Procedures    Medications Ordered in ED Medications  acetaminophen (TYLENOL) tablet 1,000 mg (1,000 mg Oral Given 11/02/21 1026)  ziprasidone (GEODON) injection 10 mg (10 mg Intramuscular Given 11/02/21 1114)  sterile water (preservative free) injection (10 mLs  Given 11/02/21 1117)    ED Course/ Medical Decision Making/ A&P                           Medical Decision Making Amount and/or Complexity of Data Reviewed Labs: ordered.  Risk OTC drugs. Prescription drug management.   Patient with schizophrenia and not taking his medicines.  He is having delusional thoughts and auditory hallucinations.  He will be seen by behavioral health and most likely placed in a psychiatric hospital        Final Clinical Impression(s) / ED Diagnoses Final diagnoses:  None    Rx / DC Orders ED Discharge Orders     None         Bethann Berkshire, MD 11/02/21 1724

## 2021-11-02 NOTE — Progress Notes (Signed)
Patient has been denied by [facility] and has been instructed to be faxed out. Patient meets BH inpatient criteria per Maxie Barb, NP. Patient has been faxed out to the following facilities:   [facility]  [address] [place] [address] [phone]  [facility]  [address] [place] [place] [address] [phone]  [facility]  [address], [place] [address] [phone]  [facility]  [address] [place] [address] [phone]  [facility]  [address] [place] [address] [phone]  [facility]  [address] [place] [address] [phone]  [facility]  [address] [place], [place] [address] [phone]  [facility]  [address] [place], [place] [address] [phone]  [facility]  [address] [place] [address] [phone]  [facility]  [address] [place] [address] [phone]  [facility]  [address] [place] [address] [phone]  [facility]  [address] [place] [address] [phone]   Damita Dunnings, MSW, LCSW-A  8:03 PM 11/02/2021
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
## 2021-11-02 NOTE — ED Notes (Signed)
Pt seen walking down the hall stating that he wanted to check himself out. This RN went up to pt and explained the IVC process to the pt. Pt stated that he just wanted to check himself out and was looking to make a phone call. This RN redirected pt to his room and told him that as long as he stayed in his room then we would find him a phone so that he could make a phone call. Pt in his room at this time sitting on bed. Pt is calm and cooperative.
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
## 2021-11-02 NOTE — ED Notes (Signed)
Pt talking with TTS at this time.  

## 2021-11-02 NOTE — ED Notes (Signed)
Pt has 2 watches [employer] debit card .50 (2 quarters) placed inside the personal bags 2 personal belonging bags (2 shirts, jacket and shoes ) are locked away in locker.  Pt has been wanded and dressed out into burg scrubs

## 2021-11-02 NOTE — Progress Notes (Signed)
[facility] LCSW Progress Note   11/02/2021    8:37 PM  JSOEPH PODESTA   [id number]   Type of Contact and Topic:  Psychiatric Bed Placement   Pt accepted to [facility]     Patient meets inpatient criteria per Maxie Barb, NP   The attending provider will be Dr. Landry Mellow  Call report to [phone] or [phone]  Denice Bors, RN @ [facility] ED notified.     Pt scheduled  to arrive at [facility] AFTER 0800.    Damita Dunnings, MSW, LCSW-A  8:38 PM 11/02/2021

## 2021-11-03 DIAGNOSIS — F200 Paranoid schizophrenia: Secondary | ICD-10-CM | POA: Diagnosis not present

## 2021-11-03 MED ORDER — ZIPRASIDONE MESYLATE 20 MG IM SOLR
20.0000 mg | Freq: Once | INTRAMUSCULAR | Status: AC
  Administered 2021-11-03: 20 mg via INTRAMUSCULAR
  Filled 2021-11-03: qty 20

## 2021-11-03 MED ORDER — STERILE WATER FOR INJECTION IJ SOLN
INTRAMUSCULAR | Status: AC
  Administered 2021-11-03: 10 mL
  Filled 2021-11-03: qty 10

## 2021-11-03 NOTE — ED Notes (Signed)
Pt wandering halls stating he does not want to stay or go to [facility] but then asked for something to sleep

## 2021-11-03 NOTE — ED Notes (Signed)
[facility] called for patient transport to [facility]

## 2021-11-04 DIAGNOSIS — E119 Type 2 diabetes mellitus without complications: Secondary | ICD-10-CM | POA: Diagnosis not present

## 2021-11-07 DIAGNOSIS — E119 Type 2 diabetes mellitus without complications: Secondary | ICD-10-CM | POA: Diagnosis not present

## 2021-11-16 ENCOUNTER — Ambulatory Visit: Payer: Medicare Other | Admitting: Orthopedic Surgery

## 2021-11-16 ENCOUNTER — Encounter (HOSPITAL_COMMUNITY): Payer: Self-pay | Admitting: Emergency Medicine

## 2021-11-16 ENCOUNTER — Emergency Department (HOSPITAL_COMMUNITY): Payer: Medicare Other

## 2021-11-16 ENCOUNTER — Other Ambulatory Visit: Payer: Self-pay

## 2021-11-16 ENCOUNTER — Inpatient Hospital Stay (HOSPITAL_COMMUNITY)
Admission: EM | Admit: 2021-11-16 | Discharge: 2021-11-17 | DRG: 683 | Disposition: A | Discharge status: Home / Self Care | Payer: Medicare Other | Attending: Family Medicine | Admitting: Family Medicine

## 2021-11-16 ENCOUNTER — Inpatient Hospital Stay (HOSPITAL_COMMUNITY): Payer: Medicare Other

## 2021-11-16 ENCOUNTER — Telehealth: Payer: Self-pay | Admitting: Orthopedic Surgery

## 2021-11-16 DIAGNOSIS — Z888 Allergy status to other drugs, medicaments and biological substances status: Secondary | ICD-10-CM

## 2021-11-16 DIAGNOSIS — N179 Acute kidney failure, unspecified: Secondary | ICD-10-CM | POA: Diagnosis not present

## 2021-11-16 DIAGNOSIS — Z91148 Patient's other noncompliance with medication regimen for other reason: Secondary | ICD-10-CM | POA: Diagnosis not present

## 2021-11-16 DIAGNOSIS — F250 Schizoaffective disorder, bipolar type: Secondary | ICD-10-CM | POA: Diagnosis present

## 2021-11-16 DIAGNOSIS — R6510 Systemic inflammatory response syndrome (SIRS) of non-infectious origin without acute organ dysfunction: Secondary | ICD-10-CM | POA: Diagnosis not present

## 2021-11-16 DIAGNOSIS — F17200 Nicotine dependence, unspecified, uncomplicated: Secondary | ICD-10-CM | POA: Diagnosis not present

## 2021-11-16 DIAGNOSIS — D72829 Elevated white blood cell count, unspecified: Secondary | ICD-10-CM | POA: Diagnosis not present

## 2021-11-16 DIAGNOSIS — T675XXA Heat exhaustion, unspecified, initial encounter: Secondary | ICD-10-CM | POA: Diagnosis not present

## 2021-11-16 DIAGNOSIS — R9431 Abnormal electrocardiogram [ECG] [EKG]: Secondary | ICD-10-CM | POA: Diagnosis not present

## 2021-11-16 DIAGNOSIS — X30XXXA Exposure to excessive natural heat, initial encounter: Secondary | ICD-10-CM | POA: Diagnosis not present

## 2021-11-16 DIAGNOSIS — E872 Acidosis, unspecified: Secondary | ICD-10-CM | POA: Diagnosis present

## 2021-11-16 DIAGNOSIS — I700 Atherosclerosis of aorta: Secondary | ICD-10-CM | POA: Diagnosis not present

## 2021-11-16 DIAGNOSIS — Z79899 Other long term (current) drug therapy: Secondary | ICD-10-CM | POA: Diagnosis not present

## 2021-11-16 DIAGNOSIS — R4182 Altered mental status, unspecified: Secondary | ICD-10-CM | POA: Diagnosis present

## 2021-11-16 DIAGNOSIS — Z791 Long term (current) use of non-steroidal anti-inflammatories (NSAID): Secondary | ICD-10-CM | POA: Diagnosis not present

## 2021-11-16 DIAGNOSIS — E785 Hyperlipidemia, unspecified: Secondary | ICD-10-CM | POA: Diagnosis present

## 2021-11-16 DIAGNOSIS — E860 Dehydration: Secondary | ICD-10-CM | POA: Diagnosis not present

## 2021-11-16 DIAGNOSIS — F411 Generalized anxiety disorder: Secondary | ICD-10-CM | POA: Diagnosis present

## 2021-11-16 DIAGNOSIS — F17210 Nicotine dependence, cigarettes, uncomplicated: Secondary | ICD-10-CM | POA: Diagnosis not present

## 2021-11-16 DIAGNOSIS — Z7984 Long term (current) use of oral hypoglycemic drugs: Secondary | ICD-10-CM | POA: Diagnosis not present

## 2021-11-16 DIAGNOSIS — E876 Hypokalemia: Secondary | ICD-10-CM | POA: Diagnosis not present

## 2021-11-16 DIAGNOSIS — R000 Tachycardia, unspecified: Secondary | ICD-10-CM | POA: Diagnosis not present

## 2021-11-16 LAB — URINALYSIS, ROUTINE W REFLEX MICROSCOPIC
Glucose, UA: NEGATIVE mg/dL
Ketones, ur: 5 mg/dL — AB
Nitrite: NEGATIVE
Protein, ur: 100 mg/dL — AB
Specific Gravity, Urine: 1.025 (ref 1.005–1.030)
pH: 5 (ref 5.0–8.0)

## 2021-11-16 LAB — COMPREHENSIVE METABOLIC PANEL
ALT: 32 U/L (ref 0–44)
AST: 36 U/L (ref 15–41)
Albumin: 5 g/dL (ref 3.5–5.0)
Alkaline Phosphatase: 79 U/L (ref 38–126)
Anion gap: 16 — ABNORMAL HIGH (ref 5–15)
BUN: 35 mg/dL — ABNORMAL HIGH (ref 6–20)
CO2: 21 mmol/L — ABNORMAL LOW (ref 22–32)
Calcium: 10 mg/dL (ref 8.9–10.3)
Chloride: 100 mmol/L (ref 98–111)
Creatinine, Ser: 2.73 mg/dL — ABNORMAL HIGH (ref 0.61–1.24)
GFR, Estimated: 28 mL/min — ABNORMAL LOW (ref >60)
Glucose, Bld: 119 mg/dL — ABNORMAL HIGH (ref 70–99)
Potassium: 3.5 mmol/L (ref 3.5–5.1)
Sodium: 137 mmol/L (ref 135–145)
Total Bilirubin: 0.9 mg/dL (ref 0.3–1.2)
Total Protein: 9.1 g/dL — ABNORMAL HIGH (ref 6.5–8.1)

## 2021-11-16 LAB — CBC WITH DIFFERENTIAL/PLATELET
Abs Immature Granulocytes: 0.11 10*3/uL — ABNORMAL HIGH (ref 0.00–0.07)
Basophils Absolute: 0.1 10*3/uL (ref 0.0–0.1)
Basophils Relative: 0 %
Eosinophils Absolute: 0.1 10*3/uL (ref 0.0–0.5)
Eosinophils Relative: 1 %
HCT: 46.2 % (ref 39.0–52.0)
Hemoglobin: 15.8 g/dL (ref 13.0–17.0)
Immature Granulocytes: 1 %
Lymphocytes Relative: 14 %
Lymphs Abs: 2.7 10*3/uL (ref 0.7–4.0)
MCH: 29.5 pg (ref 26.0–34.0)
MCHC: 34.2 g/dL (ref 30.0–36.0)
MCV: 86.4 fL (ref 80.0–100.0)
Monocytes Absolute: 1.1 10*3/uL — ABNORMAL HIGH (ref 0.1–1.0)
Monocytes Relative: 5 %
Neutro Abs: 16.2 10*3/uL — ABNORMAL HIGH (ref 1.7–7.7)
Neutrophils Relative %: 79 %
Platelets: 502 10*3/uL — ABNORMAL HIGH (ref 150–400)
RBC: 5.35 MIL/uL (ref 4.22–5.81)
RDW: 12.4 % (ref 11.5–15.5)
WBC: 20.3 10*3/uL — ABNORMAL HIGH (ref 4.0–10.5)
nRBC: 0 % (ref 0.0–0.2)

## 2021-11-16 LAB — LACTIC ACID, PLASMA
Lactic Acid, Venous: 3 mmol/L (ref 0.5–1.9)
Lactic Acid, Venous: 1.1 mmol/L (ref 0.5–1.9)

## 2021-11-16 LAB — RAPID URINE DRUG SCREEN, HOSP PERFORMED
Amphetamines: NOT DETECTED
Barbiturates: NOT DETECTED
Benzodiazepines: NOT DETECTED
Cocaine: NOT DETECTED
Opiates: NOT DETECTED
Tetrahydrocannabinol: NOT DETECTED

## 2021-11-16 LAB — ETHANOL: Alcohol, Ethyl (B): <10 mg/dL (ref <10)

## 2021-11-16 MED ORDER — SODIUM CHLORIDE 0.9 % IV SOLN
2.0000 g | Freq: Two times a day (BID) | INTRAVENOUS | Status: DC
  Administered 2021-11-16 – 2021-11-17 (×2): 2 g via INTRAVENOUS
  Filled 2021-11-16 (×2): qty 12.5

## 2021-11-16 MED ORDER — SODIUM CHLORIDE 0.9 % IV SOLN: 2.0000 g | Freq: Once | INTRAVENOUS | Status: DC

## 2021-11-16 MED ORDER — METRONIDAZOLE 500 MG/100ML IV SOLN
500.0000 mg | Freq: Once | INTRAVENOUS | Status: AC
  Administered 2021-11-16: 500 mg via INTRAVENOUS
  Filled 2021-11-16: qty 100

## 2021-11-16 MED ORDER — SODIUM CHLORIDE 0.9 % IV BOLUS
1000.0000 mL | Freq: Once | INTRAVENOUS | Status: AC
  Administered 2021-11-16: 1000 mL via INTRAVENOUS

## 2021-11-16 MED ORDER — VANCOMYCIN HCL 1250 MG/250ML IV SOLN
1250.0000 mg | Freq: Once | INTRAVENOUS | Status: DC
Start: 2021-11-16 — End: 2021-11-17

## 2021-11-16 MED ORDER — VANCOMYCIN HCL 1250 MG/250ML IV SOLN
1250.0000 mg | Freq: Once | INTRAVENOUS | Status: AC
Start: 2021-11-16 — End: 2021-11-17
  Administered 2021-11-17: 1250 mg via INTRAVENOUS
  Filled 2021-11-16: qty 250

## 2021-11-16 MED ORDER — VANCOMYCIN VARIABLE DOSE PER UNSTABLE RENAL FUNCTION (PHARMACIST DOSING): Status: DC

## 2021-11-16 MED ORDER — LACTATED RINGERS IV SOLN: INTRAVENOUS | Status: DC

## 2021-11-16 MED ORDER — VANCOMYCIN HCL IN DEXTROSE 1-5 GM/200ML-% IV SOLN: 1000.0000 mg | Freq: Once | INTRAVENOUS | Status: DC

## 2021-11-16 NOTE — ED Notes (Signed)
Pt ambulated to restroom, provided urine sample. 

## 2021-11-16 NOTE — ED Notes (Signed)
Pt calm and cooperative while obtaining IV access and blood cultures.

## 2021-11-16 NOTE — ED Notes (Signed)
Cell phone and Consulting civil engineer with security.

## 2021-11-16 NOTE — ED Notes (Signed)
Labs being drawn at this time.

## 2021-11-16 NOTE — Progress Notes (Signed)
Attempted admission questions but patient is not able to answer questions appropriately.

## 2021-11-16 NOTE — ED Notes (Signed)
Security wanded pt ?

## 2021-11-16 NOTE — ED Notes (Signed)
Patient transported to X-ray 

## 2021-11-16 NOTE — ED Notes (Signed)
Pt belongings are locked in the room.

## 2021-11-16 NOTE — ED Provider Notes (Signed)
[facility] Provider Note   CSN: [id number] Arrival date & time: 11/16/21  1726     History  No chief complaint on file.   [name] is a 46 y.o. male  with a history of Schizophrenia, paranoid type who presented to [facility] via [facility] involuntarily for evaluation.  Patient presents under IVC, initiated by Mother.   Per petition, patient has been diagnosed with paranoid schizophrenia and he does not take his meds. Patient was recently discharged from [facility].  Manages his own medications.  I discussed reason for IV seeing with patient's mother by telephone.  He has not been aggressive or threatening.  He denies suicidal or homicidal ideation.  Patient does not know what medications he is supposed to be taking.  He does not know who prescribes his medications or manages his psychiatric illness.  I attempted to call [facility] to see what he was discharged on and who he was supposed to follow-up with as I felt he could likely be managed in the outpatient setting however I was unable to obtain any information via telephone.  HPI     Home Medications Prior to Admission medications   Medication Sig Start Date End Date Taking? Authorizing Provider  ABILIFY MAINTENA 400 MG PRSY prefilled syringe SMARTSIG:1 Each IM Once a Month Patient not taking: Reported on 11/02/2021 09/25/21   [provider]  atorvastatin (LIPITOR) 40 MG tablet Take 40 mg by mouth at bedtime. Patient not taking: Reported on 11/02/2021 10/19/21   [provider]  benztropine (COGENTIN) 1 MG tablet Take 1 tablet (1 mg total) by mouth 2 (two) times daily. Patient not taking: Reported on 11/02/2021 10/10/21 11/09/21  [name], Debe Coder, MD  ibuprofen (ADVIL) 800 MG tablet Take 800 mg by mouth 2 (two) times daily as needed. 03/17/20   [provider]  naproxen (NAPROSYN) 500 MG tablet Take 1 tablet (500 mg total) by mouth 2 (two) times daily. 10/29/21   Dorie Rank, MD  OLANZapine (ZYPREXA) 15  MG tablet Take 2 tablets (30 mg total) by mouth at bedtime. Patient not taking: Reported on 11/02/2021 10/10/21 11/09/21  [name], Debe Coder, MD  OLANZapine (ZYPREXA) 5 MG tablet Take 1 tablet (5 mg total) by mouth daily. Patient not taking: Reported on 11/02/2021 10/10/21 11/09/21  [name], Debe Coder, MD  SYNJARDY XR 01-999 MG TB24 Take by mouth. Patient not taking: Reported on 11/02/2021 11/01/21   [provider]      Allergies    Other    Review of Systems   Review of Systems  Physical Exam Updated Vital Signs BP 126/80 (BP Location: Right Arm)   Pulse (!) 129   Temp 97.8 F (36.6 C) (Oral)   Resp 18   SpO2 98%  Physical Exam Vitals and nursing note reviewed.  Constitutional:      General: He is not in acute distress.    Appearance: He is well-developed. He is not diaphoretic.  HENT:     Head: Normocephalic and atraumatic.  Eyes:     General: No scleral icterus.    Conjunctiva/sclera: Conjunctivae normal.  Cardiovascular:     Rate and Rhythm: Normal rate and regular rhythm.     Heart sounds: Normal heart sounds.  Pulmonary:     Effort: Pulmonary effort is normal. No respiratory distress.     Breath sounds: Normal breath sounds.  Abdominal:     Palpations: Abdomen is soft.     Tenderness: There is no abdominal tenderness.  Musculoskeletal:  Cervical back: Normal range of motion and neck supple.  Skin:    General: Skin is warm and dry.  Neurological:     Mental Status: He is alert.  Psychiatric:     Comments: Patient is a bit tangential and disorganized but can carry on clear and coherent conversation.  He is calm and cooperative, mildly paranoid and delusional.  He does not appear to be responding to internal stimuli.     ED Results / Procedures / Treatments   Labs (all labs ordered are listed, but only abnormal results are displayed) Labs Reviewed - No data to display  EKG None  Radiology No results found.  Procedures .Critical Care  Performed by:  Arthor Captain, PA-C Authorized by: Arthor Captain, PA-C   Critical care provider statement:    Critical care time (minutes):  60   Critical care time was exclusive of:  Separately billable procedures and treating other patients   Critical care was necessary to treat or prevent imminent or life-threatening deterioration of the following conditions:  Renal failure   Critical care was time spent personally by me on the following activities:  Development of treatment plan with patient or surrogate, discussions with consultants, evaluation of patient's response to treatment, examination of patient, ordering and review of laboratory studies, ordering and review of radiographic studies, ordering and performing treatments and interventions, pulse oximetry, re-evaluation of patient's condition and review of old charts     Medications Ordered in ED Medications - No data to display  ED Course/ Medical Decision Making/ A&P Clinical Course as of 11/16/21 2330  Fri Nov 16, 2021  2002 WBC(!): 20.3 [AH]  2003 Creatinine(!): 2.73 Patient with white count of 20,000, heart rate of 129 and new AKI. At is not medically clear and will need medical admission.  Potential sepsis versus dehydration.  Unsure of source.  Awaiting urinalysis and chest x-ray.  I have started fluid resuscitation. [AH]  2018 DG Chest 2 View I reviewed the patient's chest x-ray which shows no acute findings [AH]  2109 Pulse Rate: 99 Heart rate has improved significantly with fluid resuscitation [AH]  2142 Urinalysis, Routine w reflex microscopic Urine, Clean Catch(!) [AH]  2142 EKG 12-Lead [AH]    Clinical Course User Index [AH] Arthor Captain, PA-C                           Medical Decision Making Patient came under involuntary commitment however found to have abnormal labs.  Sepsis versus significant dehydration.  Lactate elevated, new AKI up from 0.83-2.73.  Patient has no abdominal symptoms, no vomiting.  No fever.   Leukocytosis of 20,000.  Getting broad-spectrum antibiotics and fluid resuscitation.  CT scan is currently pending.  Patient's vital signs have significantly improved after fluid resuscitation.  Initial lactate 3.0-second is pending.  I have discussed the case with Dr. Despina Hick who will admit the patient.  Of asked Dr. Pilar Plate to follow-up on CT renal stone study.  Patient is stable throughout his visit here.  Amount and/or Complexity of Data Reviewed Labs: ordered. Decision-making details documented in ED Course. Radiology: ordered. Decision-making details documented in ED Course. ECG/medicine tests: ordered. Decision-making details documented in ED Course.  Risk Prescription drug management. Decision regarding hospitalization.  Patient is currently and under involuntary commitment.  I have low suspicion that the patient needs to be admitted again for psychiatric emergency however I am concerned that as he lives by himself and is the one  managing his medications he does not seem to know what medicines he needs to take more who prescribes them or manages his psychiatric illness.  I think that this needs to be sorted out and then potentially IVC can be overturned and he may go back to his home.  I have placed a consult for TTS as I been unable to manage this through traditional means.        Final Clinical Impression(s) / ED Diagnoses Final diagnoses:  None    Rx / DC Orders ED Discharge Orders     None         Arthor Captain, PA-C 11/16/21 2333    Eber Hong, MD 11/17/21 1048

## 2021-11-16 NOTE — ED Triage Notes (Signed)
Pt brought in by [facility] for IVC.  Per IVC paperwork pt recently d/c from [facility] and has not been acting right recently.  Pt rambling about random things in triage.

## 2021-11-16 NOTE — ED Notes (Signed)
Pt provided with IVC paperwork, clothing, shoes, wallet, watch. Security still has Health and safety inspector. States that they will provide once patient is released.

## 2021-11-16 NOTE — Telephone Encounter (Signed)
Patient walked in wanting an appt, I was going to schedule him for today, but we need a good contact number. He states he just got a new phone and didn't know the number he was going to have to call someone.  He said down and was calling people, then he just walked out.  I canceled the appt

## 2021-11-16 NOTE — ED Notes (Signed)
Pt being dressed out now.

## 2021-11-16 NOTE — Progress Notes (Signed)
PHARMACY ANTIBIOTIC CONSULT NOTE   [name] a 46 y.o. male admitted on 7/21 with AMS and possible infection of unknown source .  Pharmacy has been consulted for vancomycin/cefepime dosing.  7/21: Scr 2.73 (BL~1), LA 3.0,  WBC 20.3  Vital Signs Stable  Calculated CrCl (C-G, wt used 120 kg via flowsheets) ~56 mL/min  Plan: START Cefepime 2g IV Q12H GIVE Vancomycin 2,500 mg IV x1 (ordered as 1,250 mg x2) (Wt used: 120 kg- via flowsheets)  THEN Vancomycin dosing per unstable renal function Flagyl per MD Monitor renal function, clinical status, de-escalation, C/S, levels as indicated   Allergies:  Allergies  Allergen Reactions   Other     Some trial medicine from [facility].     There were no vitals filed for this visit.     Latest Ref Rng & Units 11/16/2021    6:51 PM 11/02/2021    9:35 AM 10/28/2021    8:05 PM  CBC  WBC 4.0 - 10.5 K/uL 20.3  10.5  12.2   Hemoglobin 13.0 - 17.0 g/dL 01.0  27.2  53.6   Hematocrit 39.0 - 52.0 % 46.2  40.3  42.1   Platelets 150 - 400 K/uL 502  357  408     Antibiotics Given (last 72 hours)     None       Antimicrobials this admission: Vancomycin 7/21>>> Cefepime 7/21>> Flagyl 7/21>>  Microbiology results: pending  Thank you for allowing pharmacy to be a part of this patient's care.  Jani Gravel, PharmD PGY-2 Infectious Diseases Resident  11/16/2021 9:59 PM

## 2021-11-16 NOTE — ED Notes (Signed)
Pt brought in by [facility] stating they have IVC paperwork for pt. Pt is handcuffed bilateral wrists.

## 2021-11-17 ENCOUNTER — Other Ambulatory Visit: Payer: Self-pay | Admitting: Family

## 2021-11-17 ENCOUNTER — Inpatient Hospital Stay (HOSPITAL_COMMUNITY): Payer: Medicare Other

## 2021-11-17 DIAGNOSIS — F250 Schizoaffective disorder, bipolar type: Secondary | ICD-10-CM

## 2021-11-17 DIAGNOSIS — N179 Acute kidney failure, unspecified: Secondary | ICD-10-CM | POA: Diagnosis not present

## 2021-11-17 DIAGNOSIS — E860 Dehydration: Secondary | ICD-10-CM | POA: Diagnosis present

## 2021-11-17 DIAGNOSIS — F17200 Nicotine dependence, unspecified, uncomplicated: Secondary | ICD-10-CM | POA: Diagnosis not present

## 2021-11-17 DIAGNOSIS — R6510 Systemic inflammatory response syndrome (SIRS) of non-infectious origin without acute organ dysfunction: Secondary | ICD-10-CM | POA: Diagnosis not present

## 2021-11-17 DIAGNOSIS — E876 Hypokalemia: Secondary | ICD-10-CM

## 2021-11-17 LAB — CBC WITH DIFFERENTIAL/PLATELET
Abs Immature Granulocytes: 0.07 10*3/uL (ref 0.00–0.07)
Basophils Absolute: 0.1 10*3/uL (ref 0.0–0.1)
Basophils Relative: 0 %
Eosinophils Absolute: 0.3 10*3/uL (ref 0.0–0.5)
Eosinophils Relative: 3 %
HCT: 36.8 % — ABNORMAL LOW (ref 39.0–52.0)
Hemoglobin: 12.5 g/dL — ABNORMAL LOW (ref 13.0–17.0)
Immature Granulocytes: 1 %
Lymphocytes Relative: 23 %
Lymphs Abs: 3 10*3/uL (ref 0.7–4.0)
MCH: 29.5 pg (ref 26.0–34.0)
MCHC: 34 g/dL (ref 30.0–36.0)
MCV: 86.8 fL (ref 80.0–100.0)
Monocytes Absolute: 0.8 10*3/uL (ref 0.1–1.0)
Monocytes Relative: 6 %
Neutro Abs: 8.6 10*3/uL — ABNORMAL HIGH (ref 1.7–7.7)
Neutrophils Relative %: 67 %
Platelets: 362 10*3/uL (ref 150–400)
RBC: 4.24 MIL/uL (ref 4.22–5.81)
RDW: 12.5 % (ref 11.5–15.5)
WBC: 12.9 10*3/uL — ABNORMAL HIGH (ref 4.0–10.5)
nRBC: 0 % (ref 0.0–0.2)

## 2021-11-17 LAB — GLUCOSE, CAPILLARY
Glucose-Capillary: 115 mg/dL — ABNORMAL HIGH (ref 70–99)
Glucose-Capillary: 137 mg/dL — ABNORMAL HIGH (ref 70–99)
Glucose-Capillary: 120 mg/dL — ABNORMAL HIGH (ref 70–99)

## 2021-11-17 LAB — HEMOGLOBIN A1C
Hgb A1c MFr Bld: 6.9 % — ABNORMAL HIGH (ref 4.8–5.6)
Mean Plasma Glucose: 151.33 mg/dL

## 2021-11-17 LAB — BASIC METABOLIC PANEL
Anion gap: 9 (ref 5–15)
BUN: 28 mg/dL — ABNORMAL HIGH (ref 6–20)
CO2: 23 mmol/L (ref 22–32)
Calcium: 8.5 mg/dL — ABNORMAL LOW (ref 8.9–10.3)
Chloride: 102 mmol/L (ref 98–111)
Creatinine, Ser: 1.18 mg/dL (ref 0.61–1.24)
GFR, Estimated: >60 mL/min (ref >60)
Glucose, Bld: 118 mg/dL — ABNORMAL HIGH (ref 70–99)
Potassium: 2.6 mmol/L — CL (ref 3.5–5.1)
Sodium: 134 mmol/L — ABNORMAL LOW (ref 135–145)

## 2021-11-17 LAB — MAGNESIUM: Magnesium: 1.9 mg/dL (ref 1.7–2.4)

## 2021-11-17 LAB — CORTISOL-AM, BLOOD: Cortisol - AM: 11.8 ug/dL (ref 6.7–22.6)

## 2021-11-17 LAB — PROTIME-INR
INR: 1 (ref 0.8–1.2)
Prothrombin Time: 13.4 seconds (ref 11.4–15.2)

## 2021-11-17 LAB — HIV ANTIBODY (ROUTINE TESTING W REFLEX): HIV Screen 4th Generation wRfx: NONREACTIVE

## 2021-11-17 LAB — PROCALCITONIN: Procalcitonin: <0.1 ng/mL

## 2021-11-17 LAB — TSH: TSH: 0.963 u[IU]/mL (ref 0.350–4.500)

## 2021-11-17 MED ORDER — OLANZAPINE 5 MG PO TABS
30.0000 mg | ORAL_TABLET | Freq: Every day | ORAL | Status: DC
Start: 2021-11-17 — End: 2021-11-17

## 2021-11-17 MED ORDER — ATORVASTATIN CALCIUM 40 MG PO TABS
40.0000 mg | ORAL_TABLET | Freq: Every day | ORAL | Status: DC
Start: 2021-11-17 — End: 2021-11-17

## 2021-11-17 MED ORDER — HEPARIN SODIUM (PORCINE) 5000 UNIT/ML IJ SOLN
5000.0000 [IU] | Freq: Three times a day (TID) | INTRAMUSCULAR | Status: DC
Start: 2021-11-17 — End: 2021-11-17
  Filled 2021-11-17: qty 1

## 2021-11-17 MED ORDER — INSULIN ASPART 100 UNIT/ML IJ SOLN
0.0000 [IU] | Freq: Three times a day (TID) | INTRAMUSCULAR | Status: DC
  Administered 2021-11-17: 2 [IU] via SUBCUTANEOUS

## 2021-11-17 MED ORDER — POLYETHYLENE GLYCOL 3350 17 G PO PACK: 17.0000 g | PACK | Freq: Every day | ORAL | Status: DC | PRN

## 2021-11-17 MED ORDER — SALINE SPRAY 0.65 % NA SOLN
1.0000 | NASAL | Status: DC | PRN
  Administered 2021-11-17: 1 via NASAL
  Filled 2021-11-17: qty 44

## 2021-11-17 MED ORDER — SODIUM CHLORIDE 0.9 % IV SOLN: 2.0000 g | Freq: Three times a day (TID) | INTRAVENOUS | Status: DC

## 2021-11-17 MED ORDER — VANCOMYCIN HCL IN DEXTROSE 1-5 GM/200ML-% IV SOLN
1000.0000 mg | Freq: Two times a day (BID) | INTRAVENOUS | Status: DC
  Administered 2021-11-17: 1000 mg via INTRAVENOUS
  Filled 2021-11-17: qty 200

## 2021-11-17 MED ORDER — POTASSIUM CHLORIDE CRYS ER 20 MEQ PO TBCR
60.0000 meq | EXTENDED_RELEASE_TABLET | Freq: Once | ORAL | Status: AC
  Administered 2021-11-17: 60 meq via ORAL
  Filled 2021-11-17: qty 3

## 2021-11-17 MED ORDER — ONDANSETRON HCL 4 MG PO TABS: 4.0000 mg | ORAL_TABLET | Freq: Four times a day (QID) | ORAL | Status: DC | PRN

## 2021-11-17 MED ORDER — SODIUM CHLORIDE 0.9 % IV SOLN: INTRAVENOUS | Status: DC

## 2021-11-17 MED ORDER — INSULIN ASPART 100 UNIT/ML IJ SOLN: 0.0000 [IU] | Freq: Every day | INTRAMUSCULAR | Status: DC

## 2021-11-17 MED ORDER — ONDANSETRON HCL 4 MG/2ML IJ SOLN: 4.0000 mg | Freq: Four times a day (QID) | INTRAMUSCULAR | Status: DC | PRN

## 2021-11-17 MED ORDER — TRAMADOL HCL 50 MG PO TABS: 50.0000 mg | ORAL_TABLET | Freq: Four times a day (QID) | ORAL | Status: DC | PRN

## 2021-11-17 MED ORDER — ACETAMINOPHEN 650 MG RE SUPP: 650.0000 mg | Freq: Four times a day (QID) | RECTAL | Status: DC | PRN

## 2021-11-17 MED ORDER — ACETAMINOPHEN 325 MG PO TABS: 650.0000 mg | ORAL_TABLET | Freq: Four times a day (QID) | ORAL | Status: DC | PRN

## 2021-11-17 NOTE — Progress Notes (Signed)
Nsg Discharge Note  Admit Date:  11/16/2021 Discharge date: 11/17/2021   ANDERSEN IORIO to be D/C'd Home per MD order.  AVS completed.  Copy for chart, and copy for patient signed, and dated. Patient/caregiver able to verbalize understanding.  Discharge Medication: Allergies as of 11/17/2021       Reactions   Other    Some trial medicine from [facility].         Medication List     STOP taking these medications    benztropine 1 MG tablet Commonly known as: COGENTIN   naproxen 500 MG tablet Commonly known as: NAPROSYN   OLANZapine 15 MG tablet Commonly known as: ZYPREXA   OLANZapine 5 MG tablet Commonly known as: ZYPREXA   Synjardy XR 01-999 MG Tb24 Generic drug: Empagliflozin-metFORMIN HCl ER       TAKE these medications    Abilify Maintena 400 MG Prsy prefilled syringe Generic drug: ARIPiprazole ER Inject 400 mg into the muscle every 28 (twenty-eight) days.   atorvastatin 40 MG tablet Commonly known as: LIPITOR Take 40 mg by mouth at bedtime.   ibuprofen 800 MG tablet Commonly known as: ADVIL Take 800 mg by mouth 2 (two) times daily as needed for fever, mild pain or headache.   metFORMIN 500 MG tablet Commonly known as: GLUCOPHAGE 1,000 mg 2 (two) times daily with a meal.        Discharge Assessment: Vitals:   11/17/21 1100 11/17/21 1155  BP: 106/63 106/63  Pulse: 85 85  Resp: 20 20  Temp: 98.3 F (36.8 C) 98.3 F (36.8 C)  SpO2: 95% 95%   Skin clean, dry and intact without evidence of skin break down, no evidence of skin tears noted. IV catheter discontinued intact. Site without signs and symptoms of complications - no redness or edema noted at insertion site, patient denies c/o pain - only slight tenderness at site.  Dressing with slight pressure applied.  D/c Instructions-Education: Discharge instructions given to patient/family with verbalized understanding. D/c education completed with patient/family including follow up instructions,  medication list, d/c activities limitations if indicated, with other d/c instructions as indicated by MD - patient able to verbalize understanding, all questions fully answered. Patient instructed to return to ED, call 911, or call MD for any changes in condition.  Patient escorted via WC, and D/C home via private auto.  Demetrio Lapping, LPN 0/98/1191 4:78 PM

## 2021-11-17 NOTE — Assessment & Plan Note (Signed)
--   pt outside in extreme heat with no shoes -- treated with IV fluid hydration with good results  -- counseled patient to stay indoors during extreme heat and to drink plenty of clear sugar free fluids

## 2021-11-17 NOTE — Discharge Instructions (Signed)
PLEASE AVOID BEING OUTSIDE IN EXTREME HEAT AND DRINK PLENTY OF CLEAR SUGAR FREE FLUIDS.   PLEASE FOLLOW UP WITH PSYCHIATRY IN 1 WEEK TO REVIEW YOUR MEDICATIONS   IMPORTANT INFORMATION: PAY CLOSE ATTENTION   PHYSICIAN DISCHARGE INSTRUCTIONS  Follow with Primary care provider  Mirna Mires, MD  and other consultants as instructed by your Hospitalist Physician  SEEK MEDICAL CARE OR RETURN TO EMERGENCY ROOM IF SYMPTOMS COME BACK, WORSEN OR NEW PROBLEM DEVELOPS   Please note: You were cared for by a hospitalist during your hospital stay. Every effort will be made to forward records to your primary care provider.  You can request that your primary care provider send for your hospital records if they have not received them.  Once you are discharged, your primary care physician will handle any further medical issues. Please note that NO REFILLS for any discharge medications will be authorized once you are discharged, as it is imperative that you return to your primary care physician (or establish a relationship with a primary care physician if you do not have one) for your post hospital discharge needs so that they can reassess your need for medications and monitor your lab values.  Please get a complete blood count and chemistry panel checked by your Primary MD at your next visit, and again as instructed by your Primary MD.  Get Medicines reviewed and adjusted: Please take all your medications with you for your next visit with your Primary MD  Laboratory/radiological data: Please request your Primary MD to go over all hospital tests and procedure/radiological results at the follow up, please ask your primary care provider to get all Hospital records sent to his/her office.  In some cases, they will be blood work, cultures and biopsy results pending at the time of your discharge. Please request that your primary care provider follow up on these results.  If you are diabetic, please bring your blood  sugar readings with you to your follow up appointment with primary care.    Please call and make your follow up appointments as soon as possible.    Also Note the following: If you experience worsening of your admission symptoms, develop shortness of breath, life threatening emergency, suicidal or homicidal thoughts you must seek medical attention immediately by calling 911 or calling your MD immediately  if symptoms less severe.  You must read complete instructions/literature along with all the possible adverse reactions/side effects for all the Medicines you take and that have been prescribed to you. Take any new Medicines after you have completely understood and accpet all the possible adverse reactions/side effects.   Do not drive when taking Pain medications or sleeping medications (Benzodiazepines)  Do not take more than prescribed Pain, Sleep and Anxiety Medications. It is not advisable to combine anxiety,sleep and pain medications without talking with your primary care practitioner  Special Instructions: If you have smoked or chewed Tobacco  in the last 2 yrs please stop smoking, stop any regular Alcohol  and or any Recreational drug use.  Wear Seat belts while driving.  Do not drive if taking any narcotic, mind altering or controlled substances or recreational drugs or alcohol.

## 2021-11-17 NOTE — Progress Notes (Addendum)
PHARMACY ANTIBIOTIC CONSULT NOTE   CEVIN RUBINSTEIN a 46 y.o. male admitted on 7/21 with AMS and possible infection of unknown source .  Pharmacy has been consulted for vancomycin/cefepime dosing. Scr normalized, renal function improved, dose adjust abx  Plan: Cefepime 2gm IV q8h Vancomycin 1000 mg IV Q 12 hrs. Goal AUC 400-550. Expected AUC: 438 SCr used: 1.18  Monitor renal function, clinical status, de-escalation, C/S, levels as indicated   Allergies:  Allergies  Allergen Reactions   Other     Some trial medicine from [facility].     Filed Weights   11/17/21 0700  Weight: 119 kg (262 lb 5.6 oz)       Latest Ref Rng & Units 11/17/2021    5:38 AM 11/16/2021    6:51 PM 11/02/2021    9:35 AM  CBC  WBC 4.0 - 10.5 K/uL 12.9  20.3  10.5   Hemoglobin 13.0 - 17.0 g/dL 22.2  97.9  89.2   Hematocrit 39.0 - 52.0 % 36.8  46.2  40.3   Platelets 150 - 400 K/uL 362  502  357     Antibiotics Given (last 72 hours)     Date/Time Action Medication Dose Rate   11/16/21 2239 New Bag/Given   ceFEPIme (MAXIPIME) 2 g in sodium chloride 0.9 % 100 mL IVPB 2 g 200 mL/hr   11/16/21 2242 New Bag/Given   metroNIDAZOLE (FLAGYL) IVPB 500 mg 500 mg 100 mL/hr   11/17/21 0153 New Bag/Given   vancomycin (VANCOREADY) IVPB 1250 mg/250 mL 1,250 mg 166.7 mL/hr   11/17/21 0949 New Bag/Given   ceFEPIme (MAXIPIME) 2 g in sodium chloride 0.9 % 100 mL IVPB 2 g 200 mL/hr       Antimicrobials this admission: Vancomycin 7/21>>> Cefepime 7/21>> Flagyl 7/21x 1 dose in ED  Microbiology results: 7/21 BCX: pending 7/21 UCX: pending  Thank you for allowing pharmacy to be a part of this patient's care.  Elder Cyphers, BS Pharm D, BCPS Clinical Pharmacist 11/17/2021 10:12 AM

## 2021-11-17 NOTE — H&P (Signed)
History and Physical    Patient: [name] YIF:[id number] DOB: Aug 22, 1975 DOA: 11/16/2021 DOS: the patient was seen and examined on 11/17/2021 PCP: Mirna Mires, MD  Patient coming from: Home  Chief Complaint: No chief complaint on file.  HPI: [name] is a 46 y.o. male with medical history significant of schizoaffective disorder, hyperlipidemia, and more presents the ED with a chief complaint of altered mental status.  Unfortunately patient is not able to provide any history.  Is reported that his mother called the police and had him IVC because he was wandering.  She did not think he was acting like himself.  Patient does live alone and manages own medications.  He at this time he cannot tell me any of the medication he supposed to be on except for ibuprofen.  He has paranoid thoughts about ibuprofen, thinking that his mother and other people are trying to break into his apartment to steal this medication.  Patient was recently discharged from [facility] was supposed to follow-up outpatient but has not done so at this time.  Patient mostly talks about people stealing his medications or his wallet or his EBT card when asked about his HPI.  No further history could be obtained at this time. Review of Systems: unable to review all systems due to the inability of the patient to answer questions. Past Medical History:  Diagnosis Date   Bipolar disorder (HCC)    Bronchitis    Hyperlipidemia    Paranoid schizophrenia (HCC)    Schizophrenia (HCC)    Past Surgical History:  Procedure Laterality Date   TESTICLE IMPLANTATION TO THIGH     TESTICLE SURGERY     Social History:  reports that he has been smoking cigarettes. He has been smoking an average of 1 pack per day. He has never used smokeless tobacco. He reports that he does not currently use alcohol. He reports current drug use. Drug: Marijuana.  Allergies  Allergen Reactions   Other     Some trial medicine from [facility].      Family History  Problem Relation Age of Onset   Schizophrenia Neg Hx     Prior to Admission medications   Medication Sig Start Date End Date Taking? Authorizing Provider  ABILIFY MAINTENA 400 MG PRSY prefilled syringe  09/25/21  Yes [provider]  ibuprofen (ADVIL) 800 MG tablet Take 800 mg by mouth 2 (two) times daily as needed. 03/17/20  Yes [provider]  metFORMIN (GLUCOPHAGE) 500 MG tablet 1,000 mg 2 (two) times daily with a meal. 11/13/21  Yes [provider]  naproxen (NAPROSYN) 500 MG tablet Take 1 tablet (500 mg total) by mouth 2 (two) times daily. 10/29/21  Yes Linwood Dibbles, MD  atorvastatin (LIPITOR) 40 MG tablet Take 40 mg by mouth at bedtime. Patient not taking: Reported on 11/02/2021 10/19/21   [provider]  benztropine (COGENTIN) 1 MG tablet Take 1 tablet (1 mg total) by mouth 2 (two) times daily. Patient not taking: Reported on 11/02/2021 10/10/21 11/09/21  [name], [name], MD  OLANZapine (ZYPREXA) 15 MG tablet Take 2 tablets (30 mg total) by mouth at bedtime. Patient not taking: Reported on 11/02/2021 10/10/21 11/09/21  [name], [name], MD  OLANZapine (ZYPREXA) 5 MG tablet Take 1 tablet (5 mg total) by mouth daily. Patient not taking: Reported on 11/02/2021 10/10/21 11/09/21  [name], [name], MD  SYNJARDY XR 01-999 MG TB24 Take by mouth. Patient not taking: Reported on 11/02/2021 11/01/21   [provider]    Physical Exam: Vitals:   11/16/21 2240 11/16/21 2343 11/16/21 2345 11/17/21 0448  BP: 120/76 90/66 90/66  116/80  Pulse: 94 96 99 94  Resp: (!) 22 (!) 24 19 18   Temp: 98.9 F (37.2 C) 98.6 F (37 C) 98.6 F (37 C) 99 F (37.2 C)  TempSrc: Oral Temporal    SpO2: 97% 97% 95% 97%   1.  General: Patient lying supine in bed,  no acute distress   2. Psychiatric: Alert and oriented x 3, paranoid tendencies, pleasant and cooperative with exam   3. Neurologic: Speech and language are normal, face is symmetric, moves all 4  extremities voluntarily, at baseline without acute deficits on limited exam   4. HEENMT:  Head is atraumatic, normocephalic, pupils reactive to light, neck is supple, trachea is midline, mucous membranes are moist   5. Respiratory : Lungs are clear to auscultation bilaterally without wheezing, rhonchi, rales, no cyanosis, no increase in work of breathing or accessory muscle use   6. Cardiovascular : Heart rate normal, rhythm is regular, no murmurs, rubs or gallops, no peripheral edema, peripheral pulses palpated   7. Gastrointestinal:  Abdomen is soft, nondistended, nontender to palpation bowel sounds active, no masses or organomegaly palpated   8. Skin:  Skin is warm, dry and intact without rashes, acute lesions, or ulcers on limited exam   9.Musculoskeletal:  No acute deformities or trauma, no asymmetry in tone, no peripheral edema, peripheral pulses palpated, no tenderness to palpation in the extremities  Data Reviewed: In the ED Temp 97.8, heart rate 99-129, respiratory rate 17-18, blood pressure 114/71, satting 98% Leukocytosis 20.3, hemoglobin 15.8, thrombocytosis at 502 AKI with a BUN of 35, creatinine 2.73, bicarb 21 Lactic acidosis 3.0 UA negative Alcohol levels undetectable UDS negative Blood cultures pending Chest x-ray shows no active disease Renal stone study shows no acute findings Patient was started on cefepime, bank, Flagyl Admission requested for further management of possible sepsis  Assessment and Plan: * AKI (acute kidney injury) (HCC) - Creatinine increased from 0.86>> 2.73 -History/etiology is unclear -Blood pressure controlled -CT renal study shows no obstruction or hydronephrosis -Renal ultrasound in the a.m. -Hold nephrotoxic agents when possible -UA is not indicative of UTI -Could possibly be secondary to sepsis but we have not identified a source of infection at this time -Continue IV fluids -Trend in the a.m.  SIRS (systemic inflammatory  response syndrome) (HCC) - Heart rate 129, white blood cell count 20.3, AKI -UA is not indicative of UTI -Chest x-ray shows no active disease -Patient denies infectious symptoms -He was given Vanco cefepime and Flagyl for broad-spectrum coverage in the ED -Continue vancomycin and cefepime -Procalcitonin is pending -Lactic acidosis initially at 3, resolved after fluid bolus -Continue to monitor  Schizoaffective disorder, bipolar type (HCC) - Patient is unsure of his meds at home -TTS has been consulted to sort out where this patient should follow-up and what meds he should be on -In the meantime we will continue Zyprexa as it was on previous med list -Currently IVC with sitter      Advance Care Planning:   Code Status: Full Code   Consults: TTS  Family Communication: No family at bedside  Severity of Illness: The appropriate patient status for this patient is INPATIENT. Inpatient status is judged to be reasonable and necessary in order to provide the required intensity of service to ensure the patient's safety. The patient's presenting symptoms, physical exam findings, and initial radiographic and laboratory  data in the context of their chronic comorbidities is felt to place them at high risk for further clinical deterioration. Furthermore, it is not anticipated that the patient will be medically stable for discharge from the hospital within 2 midnights of admission.   * I certify that at the point of admission it is my clinical judgment that the patient will require inpatient hospital care spanning beyond 2 midnights from the point of admission due to high intensity of service, high risk for further deterioration and high frequency of surveillance required.*  Author: Lilyan Gilford, DO 11/17/2021 6:05 AM  For on call review [email or web address].

## 2021-11-17 NOTE — Consult Note (Signed)
Telepsych Consultation   Reason for Consult: Involuntary commitment Referring Physician: Emergency room provider Location of Patient: [id number] Location of Provider: Other: [facility]  Patient Identification: KHAMAURI RONDEAU MRN:  [id number] Principal Diagnosis: AKI (acute kidney injury) ([place]) Diagnosis:  Principal Problem:   AKI (acute kidney injury) ([place]) Active Problems:   Schizoaffective disorder, bipolar type ([place])   Tobacco use disorder   SIRS (systemic inflammatory response syndrome) ([place])   Total Time spent with patient: 15 minutes  Subjective:   [name] is a 46 y.o. male presented under involuntary commitment due to reports of noncompliant with medication, agitation and paranoia.  He reports he was walking on the street and the police brought him to the emergency department.  He denied suicidal or homicidal ideations.  Denies auditory visual hallucinations.  Was reported he was recently discharged from inpatient admission and received injections of his medication.  States that he does take his metformin as indicated. "  I do not know why my mom wanted me to be evaluated."  He provided verbal authorization to follow-up with his mother Mikle Bosworth.  Multiple attempts to follow-up with patient's mother without success.  Attempted to call [phone].  He denied illicit drug use or substance abuse history.  He reports he resides by his self.  Patient to be cleared by psychiatry.  Keep out outpatient follow-up appointments.  Support, encouragement and reassurance was provided.   During evaluation PALANI KERWICK is sitting in no acute distress. He is alert/oriented x 4; calm/cooperative; and mood congruent with affect. He is speaking in a clear tone at moderate volume, and normal pace; with good eye contact.  His thought process is coherent and relevant; There is no indication that he is currently responding to internal/external stimuli or experiencing delusional thought  content; and he has denied suicidal/self-harm/homicidal ideation, psychosis, and paranoia. Patient has remained calm throughout assessment and has answered questions appropriately.    HPI: Charted history with bipolar disorder, paranoid schizophrenia and generalized anxiety disorder.  Home medications with Zyprexa, Abilify injection and Cogentin.  Past Psychiatric History:   Risk to Self:   Risk to Others:   Prior Inpatient Therapy:   Prior Outpatient Therapy:    Past Medical History:  Past Medical History:  Diagnosis Date   Bipolar disorder ([place])    Bronchitis    Hyperlipidemia    Paranoid schizophrenia ([place])    Schizophrenia ([place])     Past Surgical History:  Procedure Laterality Date   TESTICLE IMPLANTATION TO THIGH     TESTICLE SURGERY     Family History:  Family History  Problem Relation Age of Onset   Schizophrenia Neg Hx    Family Psychiatric  History:  Social History:  Social History   Substance and Sexual Activity  Alcohol Use Not Currently   Comment: weekly     Social History   Substance and Sexual Activity  Drug Use Yes   Types: Marijuana   Comment: smokes hemp    Social History   Socioeconomic History   Marital status: Single    Spouse name: Not on file   Number of children: Not on file   Years of education: 12 years   Highest education level: 12th grade  Occupational History   Occupation: On disability  Tobacco Use   Smoking status: Every Day    Packs/day: 1.00    Types: Cigarettes   Smokeless tobacco: Never  Vaping Use   Vaping Use: Never used  Substance  and Sexual Activity   Alcohol use: Not Currently    Comment: weekly   Drug use: Yes    Types: Marijuana    Comment: smokes hemp   Sexual activity: Yes    Birth control/protection: Condom  Other Topics Concern   Not on file  Social History Narrative   Pt lives alone in apartment complex for mentally ill   Social Determinants of Health   Financial Resource Strain: Not on file   Food Insecurity: Not on file  Transportation Needs: Not on file  Physical Activity: Not on file  Stress: Not on file  Social Connections: Not on file   Additional Social History:    Allergies:   Allergies  Allergen Reactions   Other     Some trial medicine from [facility].     Labs:  Results for orders placed or performed during the hospital encounter of 11/16/21 (from the past 48 hour(s))  Urine rapid drug screen (hosp performed)     Status: None   Collection Time: 11/16/21  6:29 PM  Result Value Ref Range   Opiates NONE DETECTED NONE DETECTED   Cocaine NONE DETECTED NONE DETECTED   Benzodiazepines NONE DETECTED NONE DETECTED   Amphetamines NONE DETECTED NONE DETECTED   Tetrahydrocannabinol NONE DETECTED NONE DETECTED   Barbiturates NONE DETECTED NONE DETECTED    Comment: (NOTE) DRUG SCREEN FOR MEDICAL PURPOSES ONLY.  IF CONFIRMATION IS NEEDED FOR ANY PURPOSE, NOTIFY LAB WITHIN 5 DAYS.  LOWEST DETECTABLE LIMITS FOR URINE DRUG SCREEN Drug Class                     Cutoff (ng/mL) Amphetamine and metabolites    1000 Barbiturate and metabolites    200 Benzodiazepine                 200 Tricyclics and metabolites     300 Opiates and metabolites        300 Cocaine and metabolites        300 THC                            50 Performed at [facility], [address] [place], [place] [address]   Comprehensive metabolic panel     Status: Abnormal   Collection Time: 11/16/21  6:51 PM  Result Value Ref Range   Sodium 137 135 - 145 mmol/L   Potassium 3.5 3.5 - 5.1 mmol/L   Chloride 100 98 - 111 mmol/L   CO2 21 (L) 22 - 32 mmol/L   Glucose, Bld 119 (H) 70 - 99 mg/dL    Comment: Glucose reference range applies only to samples taken after fasting for at least 8 hours.   BUN 35 (H) 6 - 20 mg/dL   Creatinine, Ser 8.58 (H) 0.61 - 1.24 mg/dL   Calcium 85.0 8.9 - 27.7 mg/dL   Total Protein 9.1 (H) 6.5 - 8.1 g/dL   Albumin 5.0 3.5 - 5.0 g/dL   AST 36 15 - 41 U/L   ALT 32 0 - 44  U/L   Alkaline Phosphatase 79 38 - 126 U/L   Total Bilirubin 0.9 0.3 - 1.2 mg/dL   GFR, Estimated 28 (L) >60 mL/min    Comment: (NOTE) Calculated using the CKD-EPI Creatinine Equation (2021)    Anion gap 16 (H) 5 - 15    Comment: Performed at [facility], [address], [place], [place] [address]  Ethanol  Status: None   Collection Time: 11/16/21  6:51 PM  Result Value Ref Range   Alcohol, Ethyl (B) <10 <10 mg/dL    Comment: (NOTE) Lowest detectable limit for serum alcohol is 10 mg/dL.  For medical purposes only. Performed at [facility], [address], [place], [place] [address]   CBC with Diff     Status: Abnormal   Collection Time: 11/16/21  6:51 PM  Result Value Ref Range   WBC 20.3 (H) 4.0 - 10.5 K/uL   RBC 5.35 4.22 - 5.81 MIL/uL   Hemoglobin 15.8 13.0 - 17.0 g/dL   HCT 46.2 39.0 - 52.0 %   MCV 86.4 80.0 - 100.0 fL   MCH 29.5 26.0 - 34.0 pg   MCHC 34.2 30.0 - 36.0 g/dL   RDW 12.4 11.5 - 15.5 %   Platelets 502 (H) 150 - 400 K/uL   nRBC 0.0 0.0 - 0.2 %   Neutrophils Relative % 79 %   Neutro Abs 16.2 (H) 1.7 - 7.7 K/uL   Lymphocytes Relative 14 %   Lymphs Abs 2.7 0.7 - 4.0 K/uL   Monocytes Relative 5 %   Monocytes Absolute 1.1 (H) 0.1 - 1.0 K/uL   Eosinophils Relative 1 %   Eosinophils Absolute 0.1 0.0 - 0.5 K/uL   Basophils Relative 0 %   Basophils Absolute 0.1 0.0 - 0.1 K/uL   Immature Granulocytes 1 %   Abs Immature Granulocytes 0.11 (H) 0.00 - 0.07 K/uL    Comment: Performed at [facility], [address] [place], [place] [address]  Urinalysis, Routine w reflex microscopic Urine, Clean Catch     Status: Abnormal   Collection Time: 11/16/21  8:04 PM  Result Value Ref Range   Color, Urine AMBER (A) YELLOW    Comment: BIOCHEMICALS MAY BE AFFECTED BY COLOR   APPearance CLOUDY (A) CLEAR   Specific Gravity, Urine 1.025 1.005 - 1.030   pH 5.0 5.0 - 8.0   Glucose, UA NEGATIVE NEGATIVE mg/dL   Hgb urine dipstick SMALL (A) NEGATIVE   Bilirubin Urine SMALL (A)  NEGATIVE   Ketones, ur 5 (A) NEGATIVE mg/dL   Protein, ur 100 (A) NEGATIVE mg/dL   Nitrite NEGATIVE NEGATIVE   Leukocytes,Ua TRACE (A) NEGATIVE   RBC / HPF 21-50 0 - 5 RBC/hpf   WBC, UA 6-10 0 - 5 WBC/hpf   Bacteria, UA RARE (A) NONE SEEN   Squamous Epithelial / LPF 0-5 0 - 5   WBC Clumps PRESENT    Mucus PRESENT    Budding Yeast PRESENT    Hyaline Casts, UA PRESENT     Comment: Performed at [facility], [address], [place], [place] [address]  Lactic acid, plasma     Status: Abnormal   Collection Time: 11/16/21  8:36 PM  Result Value Ref Range   Lactic Acid, Venous 3.0 (HH) 0.5 - 1.9 mmol/L    Comment: CRITICAL RESULT CALLED TO, READ BACK BY AND VERIFIED WITH: WALKER,T ON 11/16/21 AT 2145 BY LOY,C Performed at [facility], [address], [place], [place] [address]   Blood culture (routine x 2)     Status: None (Preliminary result)   Collection Time: 11/16/21  8:36 PM   Specimen: BLOOD RIGHT WRIST  Result Value Ref Range   Specimen Description BLOOD RIGHT WRIST BOTTLES DRAWN AEROBIC ONLY    Special Requests Blood Culture adequate volume    Culture      NO GROWTH < 12 HOURS Performed at [facility], [address] [place], [place] [address]    Report Status PENDING   Blood culture (routine x 2)     Status: None (Preliminary result)   Collection Time: 11/16/21  8:36 PM   Specimen: Right Antecubital; Blood  Result Value Ref Range   Specimen Description      RIGHT ANTECUBITAL BOTTLES DRAWN AEROBIC AND ANAEROBIC   Special Requests Blood Culture adequate volume    Culture      NO GROWTH < 12 HOURS Performed at [facility], [address] [place], [place] [address]    Report Status PENDING   Lactic acid, plasma     Status: None   Collection Time: 11/16/21 10:49 PM  Result Value Ref Range   Lactic Acid, Venous 1.1 0.5 - 1.9 mmol/L    Comment: Performed at [facility], [address], [place], [place] [address]  HIV Antibody (routine testing w rflx)     Status: None    Collection Time: 11/16/21 10:49 PM  Result Value Ref Range   HIV Screen 4th Generation wRfx Non Reactive Non Reactive    Comment: Performed at [facility], [address], [place], [place] [address]  Procalcitonin     Status: None   Collection Time: 11/16/21 10:49 PM  Result Value Ref Range   Procalcitonin <0.10 ng/mL    Comment:        Interpretation: PCT (Procalcitonin) <= 0.5 ng/mL: Systemic infection (sepsis) is not likely. Local bacterial infection is possible. (NOTE)       Sepsis PCT Algorithm           Lower Respiratory Tract                                      Infection PCT Algorithm    ----------------------------     ----------------------------         PCT < 0.25 ng/mL                PCT < 0.10 ng/mL          Strongly encourage             Strongly discourage   discontinuation of antibiotics    initiation of antibiotics    ----------------------------     -----------------------------       PCT 0.25 - 0.50 ng/mL            PCT 0.10 - 0.25 ng/mL               OR       >80% decrease in PCT            Discourage initiation of                                            antibiotics      Encourage discontinuation           of antibiotics    ----------------------------     -----------------------------         PCT >= 0.50 ng/mL              PCT 0.26 - 0.50 ng/mL               AND        <80% decrease in PCT  Encourage initiation of                                             antibiotics       Encourage continuation           of antibiotics    ----------------------------     -----------------------------        PCT >= 0.50 ng/mL                  PCT > 0.50 ng/mL               AND         increase in PCT                  Strongly encourage                                      initiation of antibiotics    Strongly encourage escalation           of antibiotics                                     -----------------------------                                            PCT <= 0.25 ng/mL                                                 OR                                        > 80% decrease in PCT                                      Discontinue / Do not initiate                                             antibiotics  Performed at [facility], [address], [place], [place] [address]   Basic metabolic panel     Status: Abnormal   Collection Time: 11/17/21  5:38 AM  Result Value Ref Range   Sodium 134 (L) 135 - 145 mmol/L   Potassium 2.6 (LL) 3.5 - 5.1 mmol/L    Comment: DELTA CHECK NOTED CRITICAL RESULT CALLED TO, READ BACK BY AND VERIFIED WITH: LEE ANN @ 0710 ON [id number] BY HENDERSON L    Chloride 102 98 - 111 mmol/L   CO2 23 22 - 32 mmol/L   Glucose, Bld 118 (H) 70 - 99 mg/dL    Comment: Glucose reference range applies only to samples taken after fasting for at least  8 hours.   BUN 28 (H) 6 - 20 mg/dL   Creatinine, Ser 1.18 0.61 - 1.24 mg/dL    Comment: DELTA CHECK NOTED   Calcium 8.5 (L) 8.9 - 10.3 mg/dL   GFR, Estimated >60 >60 mL/min    Comment: (NOTE) Calculated using the CKD-EPI Creatinine Equation (2021)    Anion gap 9 5 - 15    Comment: Performed at [facility], [address], [place], [place] [address]  Protime-INR     Status: None   Collection Time: 11/17/21  5:38 AM  Result Value Ref Range   Prothrombin Time 13.4 11.4 - 15.2 seconds   INR 1.0 0.8 - 1.2    Comment: (NOTE) INR goal varies based on device and disease states. Performed at [facility], [address] [place], [place] [address]   Cortisol-am, blood     Status: None   Collection Time: 11/17/21  5:38 AM  Result Value Ref Range   Cortisol - AM 11.8 6.7 - 22.6 ug/dL    Comment: Performed at [facility] [address] [place], [place] [address]  Magnesium     Status: None   Collection Time: 11/17/21  5:38 AM  Result Value Ref Range   Magnesium 1.9 1.7 - 2.4 mg/dL    Comment: Performed at [facility], [address], [place], [place]  [address]  CBC with Differential/Platelet     Status: Abnormal   Collection Time: 11/17/21  5:38 AM  Result Value Ref Range   WBC 12.9 (H) 4.0 - 10.5 K/uL   RBC 4.24 4.22 - 5.81 MIL/uL   Hemoglobin 12.5 (L) 13.0 - 17.0 g/dL   HCT 36.8 (L) 39.0 - 52.0 %   MCV 86.8 80.0 - 100.0 fL   MCH 29.5 26.0 - 34.0 pg   MCHC 34.0 30.0 - 36.0 g/dL   RDW 12.5 11.5 - 15.5 %   Platelets 362 150 - 400 K/uL   nRBC 0.0 0.0 - 0.2 %   Neutrophils Relative % 67 %   Neutro Abs 8.6 (H) 1.7 - 7.7 K/uL   Lymphocytes Relative 23 %   Lymphs Abs 3.0 0.7 - 4.0 K/uL   Monocytes Relative 6 %   Monocytes Absolute 0.8 0.1 - 1.0 K/uL   Eosinophils Relative 3 %   Eosinophils Absolute 0.3 0.0 - 0.5 K/uL   Basophils Relative 0 %   Basophils Absolute 0.1 0.0 - 0.1 K/uL   Immature Granulocytes 1 %   Abs Immature Granulocytes 0.07 0.00 - 0.07 K/uL    Comment: Performed at [facility], [address] [place], [place] [address]  Hemoglobin A1c     Status: Abnormal   Collection Time: 11/17/21  5:39 AM  Result Value Ref Range   Hgb A1c MFr Bld 6.9 (H) 4.8 - 5.6 %    Comment: (NOTE) Pre diabetes:          5.7%-6.4%  Diabetes:              >6.4%  Glycemic control for   <7.0% adults with diabetes    Mean Plasma Glucose 151.33 mg/dL    Comment: Performed at [facility], [address] [place],  [address]  TSH     Status: None   Collection Time: 11/17/21  5:39 AM  Result Value Ref Range   TSH 0.963 0.350 - 4.500 uIU/mL    Comment: Performed by a 3rd Generation assay with a functional sensitivity of <=0.01 uIU/mL. Performed at  [address] [place], [place] [address]   Glucose, capillary     Status: Abnormal   Collection Time: 11/17/21  7:43 AM  Result Value Ref Range   Glucose-Capillary 115 (H) 70 - 99 mg/dL    Comment: Glucose reference range applies only to samples taken after fasting for at least 8 hours.  Glucose, capillary     Status: Abnormal   Collection Time: 11/17/21 11:50 AM   Result Value Ref Range   Glucose-Capillary 137 (H) 70 - 99 mg/dL    Comment: Glucose reference range applies only to samples taken after fasting for at least 8 hours.    Medications:  Current Facility-Administered Medications  Medication Dose Route Frequency Provider Last Rate Last Admin   0.9 %  sodium chloride infusion   Intravenous Continuous Zierle-Ghosh, Asia B, DO       acetaminophen (TYLENOL) tablet 650 mg  650 mg Oral Q6H PRN Zierle-Ghosh, Asia B, DO       Or   acetaminophen (TYLENOL) suppository 650 mg  650 mg Rectal Q6H PRN Zierle-Ghosh, Asia B, DO       atorvastatin (LIPITOR) tablet 40 mg  40 mg Oral QHS Zierle-Ghosh, Asia B, DO       ceFEPIme (MAXIPIME) 2 g in sodium chloride 0.9 % 100 mL IVPB  2 g Intravenous Q8H Johnson, Clanford L, MD       heparin injection 5,000 Units  5,000 Units Subcutaneous Q8H Zierle-Ghosh, Asia B, DO       insulin aspart (novoLOG) injection 0-15 Units  0-15 Units Subcutaneous TID WC Zierle-Ghosh, Asia B, DO   2 Units at 11/17/21 1239   insulin aspart (novoLOG) injection 0-5 Units  0-5 Units Subcutaneous QHS Zierle-Ghosh, Asia B, DO       lactated ringers infusion   Intravenous Continuous Zierle-Ghosh, Asia B, DO 150 mL/hr at 11/17/21 0148 New Bag at 11/17/21 0148   ondansetron (ZOFRAN) tablet 4 mg  4 mg Oral Q6H PRN Zierle-Ghosh, Asia B, DO       Or   ondansetron (ZOFRAN) injection 4 mg  4 mg Intravenous Q6H PRN Zierle-Ghosh, Asia B, DO       polyethylene glycol (MIRALAX / GLYCOLAX) packet 17 g  17 g Oral Daily PRN Zierle-Ghosh, Asia B, DO       sodium chloride (OCEAN) 0.65 % nasal spray 1 spray  1 spray Each Nare PRN Johnson, Clanford L, MD   1 spray at 11/17/21 0951   traMADol (ULTRAM) tablet 50 mg  50 mg Oral Q6H PRN Zierle-Ghosh, Asia B, DO       vancomycin (VANCOCIN) IVPB 1000 mg/200 mL premix  1,000 mg Intravenous Q12H Johnson, Clanford L, MD        Musculoskeletal: Strength & Muscle Tone: within normal limits Gait & Station: normal Patient  leans: N/A          Psychiatric Specialty Exam:  Presentation  General Appearance: Appropriate for Environment Eye Contact:Good Speech:Clear and Coherent Speech Volume:Normal Handedness:Right  Mood and Affect  Mood:Anxious; Depressed Affect:Appropriate; Congruent  Thought Process  Thought Processes:Coherent Descriptions of Associations:Intact  Orientation:Full (Time, Place and Person)  Thought Content:Logical  History of Schizophrenia/Schizoaffective disorder:No  Duration of Psychotic Symptoms:Greater than six months  Hallucinations:Hallucinations: None  Ideas of Reference:None  Suicidal Thoughts:Suicidal Thoughts: No  Homicidal Thoughts:Homicidal Thoughts: No   Sensorium  Memory:Recent Good Judgment:Fair Insight:Fair  Executive Functions  Concentration:Fair Attention Span:Good Recall:Good Fund of Knowledge:Fair Language:Fair  Psychomotor Activity  Psychomotor Activity:Psychomotor Activity: Normal  Assets  Assets:Communication Skills; Desire for Improvement  Sleep  Sleep:Sleep: Fair   Physical Exam: Physical Exam Vitals reviewed.  Constitutional:      Appearance: Normal appearance.  Neurological:     Mental Status: He is alert and oriented to person, place, and time.  Psychiatric:        Mood and Affect: Mood normal.        Behavior: Behavior normal.        Thought Content: Thought content normal.    Review of Systems  Eyes: Negative.   Cardiovascular: Negative.   Psychiatric/Behavioral:  Negative for depression and suicidal ideas. The patient is nervous/anxious.   All other systems reviewed and are negative.  Blood pressure 106/63, pulse 85, temperature 98.3 F (36.8 C), temperature source Oral, resp. rate 20, weight 119 kg, SpO2 95 %. Body mass index is 34.61 kg/m.  Treatment Plan Summary: Daily contact with patient to assess and evaluate symptoms and progress in treatment and Medication management  Disposition: No evidence  of imminent risk to self or others at present.   Patient does not meet criteria for psychiatric inpatient admission. Supportive therapy provided about ongoing stressors. Refer to IOP. Discussed crisis plan, support from social network, calling 911, coming to the Emergency Department, and calling Suicide Hotline.  This service was provided via telemedicine using a 2-way, interactive audio and video technology.  Names of all persons participating in this telemedicine service and their role in this encounter. Name: Lenus Trauger Role: patient   Name: T.Keera Altidor Role: NP           Oneta Rack, NP 11/17/2021 2:52 PM

## 2021-11-17 NOTE — Assessment & Plan Note (Addendum)
-   Patient is unsure of his meds at home -TTS has been consulted and patient psych cleared and should resume outpatient follow up with behavioral health

## 2021-11-17 NOTE — Assessment & Plan Note (Addendum)
-  secondary to stress response from heat exhaustion and dehydration  - Heart rate 129, white blood cell count 20.3, AKI -UA is not indicative of UTI -Chest x-ray shows no active disease -Patient denies infectious symptoms -He was given Vanco cefepime and Flagyl for broad-spectrum coverage in the ED -Continue vancomycin and cefepime -Procalcitonin is reassuring at <0.10 -Lactic acidosis initially at 3, resolved after fluid bolus -DC antibiotics -DC  Home today

## 2021-11-17 NOTE — Discharge Summary (Addendum)
Physician Discharge Summary  [name] ZCH:[id number] DOB: 10-Aug-1975 DOA: 11/16/2021  PCP: Mirna Mires, MD  Admit date: 11/16/2021 Discharge date: 11/17/2021  Admitted From:  Home  Disposition: Home   Recommendations for Outpatient Follow-up:  Follow up with PCP in 1 weeks Follow up with psychiatrist in 1 week  Avoid being outside in extreme heat and drink plenty of water Please check BMP in 1 week to follow up renal function, electrolytes  Discharge Condition: STABLE   CODE STATUS: FULL DIET: heart heathy/carb modified    Brief Hospitalization Summary: Please see all hospital notes, images, labs for full details of the hospitalization.  46 y.o. male with medical history significant of schizoaffective disorder, hyperlipidemia, and more presents the ED with a chief complaint of altered mental status.  Unfortunately patient is not able to provide any history.  Is reported that his mother called the police and had him IVC because he was wandering.  She did not think he was acting like himself.  Patient does live alone and manages own medications.  He at this time he cannot tell me any of the medication he supposed to be on except for ibuprofen.  He has paranoid thoughts about ibuprofen, thinking that his mother and other people are trying to break into his apartment to steal this medication.  Patient was recently discharged from [facility] was supposed to follow-up outpatient but has not done so at this time.  Patient mostly talks about people stealing his medications or his wallet or his EBT card when asked about his HPI.  No further history could be obtained at this time.  Hospital course by problem   * AKI (acute kidney injury) - treated and resolved -Pt improved faster than we had expected. He responded favorably to IV fluid hydration and holding nephrotoxic agents. Normal renal US.   -Creatinine increased from 0.86>> 2.73 - prerenal from dehydration  -After IV fluids creatinine down  to 1.18 -Blood pressure controlled -CT renal study shows no obstruction or hydronephrosis -Renal ultrasound in the a.m. -Hold nephrotoxic agents when possible -UA is not indicative of UTI -Counseled patient to stay out of the extreme heat and drink plenty of clear sugar free fluids  Dehydration -- pt outside in extreme heat with no shoes -- treated with IV fluid hydration with good results  -- counseled patient to stay indoors during extreme heat and to drink plenty of clear sugar free fluids  Hypokalemia -- repleted orally with Kdur 60 meq x 1 dose   SIRS (systemic inflammatory response syndrome) (HCC) -secondary to stress response from heat exhaustion and dehydration  - Heart rate 129, white blood cell count 20.3, AKI -UA is not indicative of UTI -Chest x-ray shows no active disease -Patient denies infectious symptoms -He was given Vanco cefepime and Flagyl for broad-spectrum coverage in the ED -Continue vancomycin and cefepime -Procalcitonin is reassuring at <0.10 -Lactic acidosis initially at 3, resolved after fluid bolus -DC antibiotics -DC  Home today  Tobacco use disorder -counseled on cessation   Schizoaffective disorder, bipolar type (HCC) - Patient is unsure of his meds at home -TTS has been consulted and patient psych cleared and should resume outpatient follow up with behavioral health   Discharge Diagnoses:  Principal Problem:   AKI (acute kidney injury) - treated and resolved Active Problems:   Schizoaffective disorder, bipolar type (HCC)   Tobacco use disorder   SIRS (systemic inflammatory response syndrome) (HCC)   Hypokalemia   Dehydration   Discharge Instructions:  Allergies as of 11/17/2021       Reactions   Other    Some trial medicine from [facility].         Medication List     STOP taking these medications    benztropine 1 MG tablet Commonly known as: COGENTIN   naproxen 500 MG tablet Commonly known as: NAPROSYN   OLANZapine 15  MG tablet Commonly known as: ZYPREXA   OLANZapine 5 MG tablet Commonly known as: ZYPREXA   Synjardy XR 01-999 MG Tb24 Generic drug: Empagliflozin-metFORMIN HCl ER       TAKE these medications    Abilify Maintena 400 MG Prsy prefilled syringe Generic drug: ARIPiprazole ER Inject 400 mg into the muscle every 28 (twenty-eight) days.   atorvastatin 40 MG tablet Commonly known as: LIPITOR Take 40 mg by mouth at bedtime.   ibuprofen 800 MG tablet Commonly known as: ADVIL Take 800 mg by mouth 2 (two) times daily as needed for fever, mild pain or headache.   metFORMIN 500 MG tablet Commonly known as: GLUCOPHAGE 1,000 mg 2 (two) times daily with a meal.        Follow-up Information     [name], MD. Schedule an appointment as soon as possible for a visit in 1 week(s).   Specialty: Family Medicine Why: Hospital Follow Up Contact information: [address] [place]  [address] [phone]         Psychiatrist. Schedule an appointment as soon as possible for a visit in 1 week(s).   Why: Hospital Follow Up               Allergies  Allergen Reactions   Other     Some trial medicine from [facility].    Allergies as of 11/17/2021       Reactions   Other    Some trial medicine from [facility].         Medication List     STOP taking these medications    benztropine 1 MG tablet Commonly known as: COGENTIN   naproxen 500 MG tablet Commonly known as: NAPROSYN   OLANZapine 15 MG tablet Commonly known as: ZYPREXA   OLANZapine 5 MG tablet Commonly known as: ZYPREXA   Synjardy XR 01-999 MG Tb24 Generic drug: Empagliflozin-metFORMIN HCl ER       TAKE these medications    Abilify Maintena 400 MG Prsy prefilled syringe Generic drug: ARIPiprazole ER Inject 400 mg into the muscle every 28 (twenty-eight) days.   atorvastatin 40 MG tablet Commonly known as: LIPITOR Take 40 mg by mouth at bedtime.   ibuprofen 800 MG tablet Commonly known  as: ADVIL Take 800 mg by mouth 2 (two) times daily as needed for fever, mild pain or headache.   metFORMIN 500 MG tablet Commonly known as: GLUCOPHAGE 1,000 mg 2 (two) times daily with a meal.        Procedures/Studies: US RENAL  Result Date: 11/17/2021 CLINICAL DATA:  Acute kidney injury. EXAM: RENAL / URINARY TRACT ULTRASOUND COMPLETE COMPARISON:  CT abdomen and pelvis without contrast 11/16/2021 FINDINGS: Right Kidney: Renal measurements: 12.2 x 6.4 x 6.3 cm = volume: 2.6 mL. Echogenicity within normal limits. No mass or hydronephrosis visualized. Left Kidney: Renal measurements: 12.8 x 6.7 x 6.5 cm = volume: 294 mL. Echogenicity within normal limits. No mass or hydronephrosis visualized. Bladder: Appears normal for degree of bladder distention. Other: None. IMPRESSION: Normal renal ultrasound. Electronically Signed   By: Yvonne Kendall M.D.   On:  11/17/2021 11:46   CT Renal Stone Study  Result Date: 11/17/2021 CLINICAL DATA:  Nephrolithiasis EXAM: CT ABDOMEN AND PELVIS WITHOUT CONTRAST TECHNIQUE: Multidetector CT imaging of the abdomen and pelvis was performed following the standard protocol without IV contrast. RADIATION DOSE REDUCTION: This exam was performed according to the departmental dose-optimization program which includes automated exposure control, adjustment of the mA and/or kV according to patient size and/or use of iterative reconstruction technique. COMPARISON:  None Available. FINDINGS: Lower Chest: Normal. Hepatobiliary: Normal hepatic contours. No intra- or extrahepatic biliary dilatation. The gallbladder is normal. Pancreas: Normal pancreas. No ductal dilatation or peripancreatic fluid collection. Spleen: Normal. Adrenals/Urinary Tract: The adrenal glands are normal. No hydronephrosis, nephroureterolithiasis or solid renal mass. The urinary bladder is normal for degree of distention Stomach/Bowel: There is no hiatal hernia. Normal duodenal course and caliber. No small bowel  dilatation or inflammation. No focal colonic abnormality. Normal appendix. Vascular/Lymphatic: There is calcific atherosclerosis of the abdominal aorta. No lymphadenopathy. Reproductive: Normal prostate size with symmetric seminal vesicles. Prosthetic testicle. Other: None. Musculoskeletal: There is grade 1 anterolisthesis at L5-S1 secondary to bilateral L5 pars interarticularis defects. IMPRESSION: 1. No acute abnormality of the abdomen or pelvis. 2. Grade 1 anterolisthesis at L5-S1 secondary to bilateral L5 pars interarticularis defects. 3. Aortic Atherosclerosis (ICD10-I70.0). Electronically Signed   By: Ulyses Jarred M.D.   On: 11/17/2021 01:50   DG Chest 2 View  Result Date: 11/16/2021 CLINICAL DATA:  tachycardia EXAM: CHEST - 2 VIEW COMPARISON:  Chest x-ray 02/23/2020 FINDINGS: The heart and mediastinal contours are within normal limits. No focal consolidation. No pulmonary edema. No pleural effusion. No pneumothorax. No acute osseous abnormality. IMPRESSION: No active cardiopulmonary disease. Electronically Signed   By: Iven Finn M.D.   On: 11/16/2021 20:07   DG Hand Complete Right  Result Date: 10/28/2021 CLINICAL DATA:  Right hand laceration. EXAM: RIGHT HAND - COMPLETE 3+ VIEW COMPARISON:  None Available. FINDINGS: There is no evidence of fracture or dislocation. There is no evidence of arthropathy or other focal bone abnormality. Soft tissues are unremarkable. IMPRESSION: Negative. Electronically Signed   By: Virgina Norfolk M.D.   On: 10/28/2021 20:26     Subjective: Pt is feeling much better after IV fluids.  His thoughts are clear and he is eating and drinking well.  He is wanting to go home.   Discharge Exam: Vitals:   11/17/21 1100 11/17/21 1155  BP: 106/63 106/63  Pulse: 85 85  Resp: 20 20  Temp: 98.3 F (36.8 C) 98.3 F (36.8 C)  SpO2: 95% 95%   Vitals:   11/17/21 0700 11/17/21 0805 11/17/21 1100 11/17/21 1155  BP:  113/75 106/63 106/63  Pulse:  80 85 85  Resp:   20 20 20   Temp:  98 F (36.7 C) 98.3 F (36.8 C) 98.3 F (36.8 C)  TempSrc:  Oral  Oral  SpO2:  98% 95% 95%  Weight: 119 kg      General: Pt is alert, awake, not in acute distress Cardiovascular: RRR, S1/S2 +, no rubs, no gallops Respiratory: CTA bilaterally, no wheezing, no rhonchi Abdominal: Soft, NT, ND, bowel sounds + Extremities: no edema, no cyanosis   The results of significant diagnostics from this hospitalization (including imaging, microbiology, ancillary and laboratory) are listed below for reference.     Microbiology: Recent Results (from the past 240 hour(s))  Blood culture (routine x 2)     Status: None (Preliminary result)   Collection Time: 11/16/21  8:36 PM  Specimen: BLOOD RIGHT WRIST  Result Value Ref Range Status   Specimen Description BLOOD RIGHT WRIST BOTTLES DRAWN AEROBIC ONLY  Final   Special Requests Blood Culture adequate volume  Final   Culture   Final    NO GROWTH < 12 HOURS Performed at [facility], [address] [place], [place] [address]    Report Status PENDING  Incomplete  Blood culture (routine x 2)     Status: None (Preliminary result)   Collection Time: 11/16/21  8:36 PM   Specimen: Right Antecubital; Blood  Result Value Ref Range Status   Specimen Description   Final    RIGHT ANTECUBITAL BOTTLES DRAWN AEROBIC AND ANAEROBIC   Special Requests Blood Culture adequate volume  Final   Culture   Final    NO GROWTH < 12 HOURS Performed at [facility], [address] [place], [place] [address]    Report Status PENDING  Incomplete     Labs: BNP (last 3 results) No results for input(s): "BNP" in the last 8760 hours. Basic Metabolic Panel: Recent Labs  Lab 11/16/21 1851 11/17/21 0538  NA 137 134*  K 3.5 2.6*  CL 100 102  CO2 21* 23  GLUCOSE 119* 118*  BUN 35* 28*  CREATININE 2.73* 1.18  CALCIUM 10.0 8.5*  MG  --  1.9   Liver Function Tests: Recent Labs  Lab 11/16/21 1851  AST 36  ALT 32  ALKPHOS 79  BILITOT 0.9  PROT  9.1*  ALBUMIN 5.0   No results for input(s): "LIPASE", "AMYLASE" in the last 168 hours. No results for input(s): "AMMONIA" in the last 168 hours. CBC: Recent Labs  Lab 11/16/21 1851 11/17/21 0538  WBC 20.3* 12.9*  NEUTROABS 16.2* 8.6*  HGB 15.8 12.5*  HCT 46.2 36.8*  MCV 86.4 86.8  PLT 502* 362   Cardiac Enzymes: No results for input(s): "CKTOTAL", "CKMB", "CKMBINDEX", "TROPONINI" in the last 168 hours. BNP: Invalid input(s): "POCBNP" CBG: Recent Labs  Lab 11/17/21 0743 11/17/21 1150  GLUCAP 115* 137*   D-Dimer No results for input(s): "DDIMER" in the last 72 hours. Hgb A1c Recent Labs    11/17/21 0539  HGBA1C 6.9*   Lipid Profile No results for input(s): "CHOL", "HDL", "LDLCALC", "TRIG", "CHOLHDL", "LDLDIRECT" in the last 72 hours. Thyroid function studies Recent Labs    11/17/21 0539  TSH 0.963   Anemia work up No results for input(s): "VITAMINB12", "FOLATE", "FERRITIN", "TIBC", "IRON", "RETICCTPCT" in the last 72 hours. Urinalysis    Component Value Date/Time   COLORURINE AMBER (A) 11/16/2021 2004   APPEARANCEUR CLOUDY (A) 11/16/2021 2004   LABSPEC 1.025 11/16/2021 2004   PHURINE 5.0 11/16/2021 2004   GLUCOSEU NEGATIVE 11/16/2021 2004   HGBUR SMALL (A) 11/16/2021 2004   BILIRUBINUR SMALL (A) 11/16/2021 2004   KETONESUR 5 (A) 11/16/2021 2004   PROTEINUR 100 (A) 11/16/2021 2004   NITRITE NEGATIVE 11/16/2021 2004   LEUKOCYTESUR TRACE (A) 11/16/2021 2004   Sepsis Labs Recent Labs  Lab 11/16/21 1851 11/17/21 0538  WBC 20.3* 12.9*   Microbiology Recent Results (from the past 240 hour(s))  Blood culture (routine x 2)     Status: None (Preliminary result)   Collection Time: 11/16/21  8:36 PM   Specimen: BLOOD RIGHT WRIST  Result Value Ref Range Status   Specimen Description BLOOD RIGHT WRIST BOTTLES DRAWN AEROBIC ONLY  Final   Special Requests Blood Culture adequate volume  Final   Culture   Final    NO GROWTH <  12 HOURS Performed at [facility], [address] [place], [place] [address]    Report Status PENDING  Incomplete  Blood culture (routine x 2)     Status: None (Preliminary result)   Collection Time: 11/16/21  8:36 PM   Specimen: Right Antecubital; Blood  Result Value Ref Range Status   Specimen Description   Final    RIGHT ANTECUBITAL BOTTLES DRAWN AEROBIC AND ANAEROBIC   Special Requests Blood Culture adequate volume  Final   Culture   Final    NO GROWTH < 12 HOURS Performed at [facility], [address], [place], [place] [address]    Report Status PENDING  Incomplete   Time coordinating discharge: 37 mins   SIGNED:  Irwin Brakeman, MD  [facility] 11/17/2021, 4:12 PM How to contact the [facility] Attending or Consulting provider [place] or covering provider during after hours [place], for this patient?  Check the care team in [facility] and look for a) attending/consulting [facility] provider listed and b) the [facility] team listed Log into [email or web address] and use [place]'s universal password to access. If you do not have the password, please contact the hospital operator. Locate the [facility] provider you are looking for under [facility] and page to a number that you can be directly reached. If you still have difficulty reaching the provider, please page the [facility] (Director on Call) for the Hospitalists listed on amion for assistance.

## 2021-11-17 NOTE — Assessment & Plan Note (Signed)
--   repleted orally with Kdur 60 meq x 1 dose

## 2021-11-17 NOTE — Hospital Course (Signed)
46 y.o. male with medical history significant of schizoaffective disorder, hyperlipidemia, and more presents the ED with a chief complaint of altered mental status.  Unfortunately patient is not able to provide any history.  Is reported that his mother called the police and had him IVC because he was wandering.  She did not think he was acting like himself.  Patient does live alone and manages own medications.  He at this time he cannot tell me any of the medication he supposed to be on except for ibuprofen.  He has paranoid thoughts about ibuprofen, thinking that his mother and other people are trying to break into his apartment to steal this medication.  Patient was recently discharged from [facility] was supposed to follow-up outpatient but has not done so at this time.  Patient mostly talks about people stealing his medications or his wallet or his EBT card when asked about his HPI.  No further history could be obtained at this time.

## 2021-11-17 NOTE — Progress Notes (Signed)
Date and time results received: 11/17/21 0710 (use smartphrase ".now" to insert current time)  Test: Potassium  Critical Value: 2.6   Name of Provider Notified: Standley Dakins MD  Orders Received? Or Actions Taken?: No new orders at this time

## 2021-11-17 NOTE — Assessment & Plan Note (Addendum)
-  Pt improved faster than we had expected. He responded favorably to IV fluid hydration and holding nephrotoxic agents. Normal renal US.   -Creatinine increased from 0.86>> 2.73 - prerenal from dehydration  -After IV fluids creatinine down to 1.18 -Blood pressure controlled -CT renal study shows no obstruction or hydronephrosis -Renal ultrasound in the a.m. -Hold nephrotoxic agents when possible -UA is not indicative of UTI -Counseled patient to stay out of the extreme heat and drink plenty of clear sugar free fluids

## 2021-11-17 NOTE — Assessment & Plan Note (Signed)
counseled on cessation 

## 2021-11-18 LAB — URINE CULTURE: Culture: NO GROWTH

## 2021-11-21 DIAGNOSIS — Z760 Encounter for issue of repeat prescription: Secondary | ICD-10-CM | POA: Insufficient documentation

## 2021-11-21 LAB — CULTURE, BLOOD (ROUTINE X 2)
Culture: NO GROWTH
Culture: NO GROWTH
Special Requests: ADEQUATE
Special Requests: ADEQUATE

## 2021-11-22 ENCOUNTER — Emergency Department (HOSPITAL_COMMUNITY)
Admission: EM | Admit: 2021-11-22 | Discharge: 2021-11-22 | Disposition: A | Discharge status: Home / Self Care | Payer: Medicare Other | Attending: Emergency Medicine | Admitting: Emergency Medicine

## 2021-11-22 ENCOUNTER — Encounter (HOSPITAL_COMMUNITY): Payer: Self-pay | Admitting: Emergency Medicine

## 2021-11-22 DIAGNOSIS — Z760 Encounter for issue of repeat prescription: Secondary | ICD-10-CM

## 2021-11-22 MED ORDER — METFORMIN HCL 500 MG PO TABS: 1000.0000 mg | ORAL_TABLET | Freq: Two times a day (BID) | ORAL | 0 refills | Status: DC

## 2021-11-22 MED ORDER — NAPROXEN 500 MG PO TABS: 500.0000 mg | ORAL_TABLET | Freq: Two times a day (BID) | ORAL | 0 refills | Status: DC

## 2021-11-22 NOTE — Discharge Instructions (Signed)
Substance Abuse Treatment Programs ° °Intensive Outpatient Programs °[facility]     °[address]      °[place], [place]                   °[phone]      ° °[facility] °[address] °[place], [place] °[phone] ° °[facility]     °(Inpatient and outpatient)     °[address]           °[phone]   ° °[facility] °[phone] (Suboxone and Methadone) ° °[address]      °[place], [place] [address]      °[phone]      ° °[address] °[place], [place] °[phone] ° °[facility] (Outpatient/Inpatient, [facility])    °(insurance only) [phone]      °       °[facility] (Groups & Residential) °[place], [place] °[phone] ° °   °[facility]     °[address]     °[place], [place]      °[phone]      ° °[facility] (for caregivers and family) °[address] °[place], [place] °336-299-4655 ° ° ° ° ° °Residential Treatment Programs °[facility]      °[address], [place], [place] [address]  °[phone]      ° °[facility] °[address] [place], [place] [address] °[phone] ° °[facility]        °[phone]      ° °[facility] °[phone] ° °[facility]             °[address]                                         °[place], [place]                                                °[phone] or [phone]                              ° °[facility] [place] °[address] °[place], [address] °[phone] ° °[facility]    °[address]      °[place], [place]     °[phone]      ° °[facility] °[address] °[place], [place] °[phone] ° °[facility]   °[address]     °[place], [place] [address]     °[phone]      °Admissions: 8am-3pm M-F ° °[facility] °[address] °[place],  [place] °[phone] ° °[facility]: Residential Program (90 Days)   °[place], [place]      °[phone] or [phone]    ° °[facility] °[place],  °(Walk in Hours over the weekend or by referral) ° °[facility] °[address], [place],  [address] °([phone] ° °Crisis Mobile: [facility]:  1-877-626-1772 (for crisis response 24 hours a day) °[facility] Hotline:      1-800-256-2452 °Outpatient Psychiatry and Counseling ° °[facility]: Mobile Crisis   Management 24 hours:  1-877-626-1772 ° °[facility] sliding scale fee and walk in schedule: M-F 8am-12pm/1pm-3pm °[address]  °[place], [place] [address] °[phone] ° °[facility] °[address] °[place], [place] [address] °[phone] ° °[facility] (Formerly known [facility])- new patient walk-in appointments available Monday - Friday 8am -3pm.          °[address] °[place], [place] [address] °336-676-6840 or crisis line- [phone] ° °[facility]/ Intensive Outpatient Therapy Program °[address] °[place], [place] [address] °[phone] ° °[facility]                  °Crisis Services      °[phone]      °[address]     °[place], [place] [address]                ° °[facility]   °[facility] °[phone] °[address] °[place], [place] [address] ° ° °[facility]          °[address],  °[place], [place] [address]       °[phone] ° °[facility] °[address] °[place], [place] [address] °[phone] ° °[facility]    °[address]    °[place], [place] [address]     °[phone]      ° °Parish McKinney, MD     °[address]     °[place] [address]     °[phone]     °  °[facility] °[address] °[place] [address] ° °[facility]     °[address]     °[place], [place]      °[phone]      ° °[facility] °Shamsher Ahluwalia, MD °[address] °[place], [place] [address] °[phone] ° °[facility]     °[address]     °[place], [place] [address]     °336-274-1237      ° °[facility] °[address] °[place], [place] [address] °336-854-4450 °Resources for Temporary Residential Assistance/Crisis Centers ° °[facility] °[facility] °M-F 8am-3pm   °[address] [place], [place] [address]   336-332-0824 °Services include: laundry, barbering, support groups, case management, phone  & computer access, showers, AA/NA mtgs, mental health/substance abuse nurse, job skills class, disability information, VA assistance, spiritual classes, etc.  ° °HOMELESS SHELTERS ° °[facility]     °[facility]   °[address], [place]     °[phone]       °       °[facility] (women and children)       °[address] °[place], [place] [address] °336-275-0820 °[email or web address] for application and process °Application Required ° °[facility]   °[address]    °[place] [address]     °[phone]       °             °[facility] °[address] °[place], [place] [address] °[phone] °336-235-0363(schedule application appt.) °Application Required ° °[facility] (women only)    °[address]     °[place],  [address]     °336-884-1039      °  Intake starts 6pm daily °Need valid ID, SSC, & Police report °[facility] [place] °[address] °[place], [place] °[phone] °Application Required ° °[facility] (men only)     °[address]      °[place], [place]     °[phone]      ° °[facility] °(Pregnant women only) °[address] °[place], [place] °336-275-0206 ° °[facility]      °[address]      °[place], [place] [address]     °[phone]      °       °[facility] °[address] °[place], [place] °336-723-1848 °90 day commitment/SA/Application process ° °[facility](men only)     °[address]     °[place], [place]     °[phone]       °Check-in at 7pm     °       °[facility] °[address] °[place], [place] [address] °336-248-6684 °Men/Women/Women and Children must be there by 7 pm ° °[facility] °[place], [place] °336-722-8721                ° °
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
## 2021-11-23 ENCOUNTER — Encounter (HOSPITAL_COMMUNITY): Payer: Self-pay | Admitting: *Deleted

## 2021-11-23 ENCOUNTER — Emergency Department (HOSPITAL_COMMUNITY)
Admission: EM | Admit: 2021-11-23 | Discharge: 2021-11-24 | Discharge status: Against Medical Advice | Payer: Medicare Other | Attending: Emergency Medicine | Admitting: Emergency Medicine

## 2021-11-23 ENCOUNTER — Other Ambulatory Visit: Payer: Self-pay

## 2021-11-23 DIAGNOSIS — R4182 Altered mental status, unspecified: Secondary | ICD-10-CM | POA: Insufficient documentation

## 2021-11-23 DIAGNOSIS — Z20822 Contact with and (suspected) exposure to covid-19: Secondary | ICD-10-CM | POA: Insufficient documentation

## 2021-11-23 DIAGNOSIS — F203 Undifferentiated schizophrenia: Secondary | ICD-10-CM

## 2021-11-23 DIAGNOSIS — R440 Auditory hallucinations: Secondary | ICD-10-CM | POA: Diagnosis present

## 2021-11-23 LAB — BASIC METABOLIC PANEL
Anion gap: 10 (ref 5–15)
BUN: 20 mg/dL (ref 6–20)
CO2: 24 mmol/L (ref 22–32)
Calcium: 9.5 mg/dL (ref 8.9–10.3)
Chloride: 101 mmol/L (ref 98–111)
Creatinine, Ser: 0.91 mg/dL (ref 0.61–1.24)
GFR, Estimated: >60 mL/min (ref >60)
Glucose, Bld: 112 mg/dL — ABNORMAL HIGH (ref 70–99)
Potassium: 3.5 mmol/L (ref 3.5–5.1)
Sodium: 135 mmol/L (ref 135–145)

## 2021-11-23 LAB — CBC WITH DIFFERENTIAL/PLATELET
Abs Immature Granulocytes: 0.05 10*3/uL (ref 0.00–0.07)
Basophils Absolute: 0.1 10*3/uL (ref 0.0–0.1)
Basophils Relative: 1 %
Eosinophils Absolute: 0.2 10*3/uL (ref 0.0–0.5)
Eosinophils Relative: 1 %
HCT: 43.3 % (ref 39.0–52.0)
Hemoglobin: 14.8 g/dL (ref 13.0–17.0)
Immature Granulocytes: 0 %
Lymphocytes Relative: 23 %
Lymphs Abs: 3.5 10*3/uL (ref 0.7–4.0)
MCH: 29.5 pg (ref 26.0–34.0)
MCHC: 34.2 g/dL (ref 30.0–36.0)
MCV: 86.3 fL (ref 80.0–100.0)
Monocytes Absolute: 0.7 10*3/uL (ref 0.1–1.0)
Monocytes Relative: 5 %
Neutro Abs: 10.5 10*3/uL — ABNORMAL HIGH (ref 1.7–7.7)
Neutrophils Relative %: 70 %
Platelets: 463 10*3/uL — ABNORMAL HIGH (ref 150–400)
RBC: 5.02 MIL/uL (ref 4.22–5.81)
RDW: 12.5 % (ref 11.5–15.5)
WBC: 15 10*3/uL — ABNORMAL HIGH (ref 4.0–10.5)
nRBC: 0 % (ref 0.0–0.2)

## 2021-11-23 LAB — ETHANOL: Alcohol, Ethyl (B): <10 mg/dL (ref <10)

## 2021-11-23 NOTE — ED Provider Notes (Signed)
[facility] EMERGENCY DEPARTMENT Provider Note   CSN: [id number] Arrival date & time: 11/23/21  2003     History {Add pertinent medical, surgical, social history, OB history to HPI:1} Chief Complaint  Patient presents with   V70.1    [name] is a 46 y.o. male.  Patient has a history of schizophrenia.  He has been delusional recently and hearing voices  The history is provided by the patient. No language interpreter was used.  Altered Mental Status Presenting symptoms: behavior changes   Severity:  Moderate Most recent episode:  More than 2 days ago Episode history:  Unable to specify Timing:  Constant Progression:  Waxing and waning Chronicity:  New Context: not alcohol use   Associated symptoms: hallucinations   Associated symptoms: no abdominal pain, no headaches, no rash and no seizures        Home Medications Prior to Admission medications   Medication Sig Start Date End Date Taking? Authorizing Provider  ABILIFY MAINTENA 400 MG PRSY prefilled syringe Inject 400 mg into the muscle every 28 (twenty-eight) days. 09/25/21   [provider]  atorvastatin (LIPITOR) 40 MG tablet Take 40 mg by mouth at bedtime. Patient not taking: Reported on 11/02/2021 10/19/21   [provider]  ibuprofen (ADVIL) 800 MG tablet Take 800 mg by mouth 2 (two) times daily as needed for fever, mild pain or headache. 03/17/20   [provider]  metFORMIN (GLUCOPHAGE) 500 MG tablet Take 2 tablets (1,000 mg total) by mouth 2 (two) times daily with a meal. 11/22/21   Zadie Rhine, MD  naproxen (NAPROSYN) 500 MG tablet Take 1 tablet (500 mg total) by mouth 2 (two) times daily. 11/22/21   Zadie Rhine, MD      Allergies    Other    Review of Systems   Review of Systems  Constitutional:  Negative for appetite change and fatigue.  HENT:  Negative for congestion, ear discharge and sinus pressure.   Eyes:  Negative for discharge.  Respiratory:  Negative for  cough.   Cardiovascular:  Negative for chest pain.  Gastrointestinal:  Negative for abdominal pain and diarrhea.  Genitourinary:  Negative for frequency and hematuria.  Musculoskeletal:  Negative for back pain.  Skin:  Negative for rash.  Neurological:  Negative for seizures and headaches.  Psychiatric/Behavioral:  Positive for hallucinations.     Physical Exam Updated Vital Signs BP (!) 144/94 (BP Location: Right Arm)   Pulse (!) 113   Temp 97.7 F (36.5 C) (Oral)   Resp 20   Wt 116.6 kg   SpO2 95%   BMI 33.91 kg/m  Physical Exam Vitals reviewed.  Constitutional:      Appearance: He is well-developed.  HENT:     Head: Normocephalic.     Nose: Nose normal.  Eyes:     General: No scleral icterus.    Conjunctiva/sclera: Conjunctivae normal.  Neck:     Thyroid: No thyromegaly.  Cardiovascular:     Rate and Rhythm: Normal rate and regular rhythm.     Heart sounds: No murmur heard.    No friction rub. No gallop.  Pulmonary:     Breath sounds: No stridor. No wheezing or rales.  Chest:     Chest wall: No tenderness.  Abdominal:     General: There is no distension.     Tenderness: There is no abdominal tenderness. There is no rebound.  Musculoskeletal:        General: Normal range of motion.  Cervical back: Neck supple.  Lymphadenopathy:     Cervical: No cervical adenopathy.  Skin:    Findings: No erythema or rash.  Neurological:     Mental Status: He is alert and oriented to person, place, and time.     Motor: No abnormal muscle tone.     Coordination: Coordination normal.  Psychiatric:     Comments: Patient with auditory hallucinations     ED Results / Procedures / Treatments   Labs (all labs ordered are listed, but only abnormal results are displayed) Labs Reviewed  CBC WITH DIFFERENTIAL/PLATELET - Abnormal; Notable for the following components:      Result Value   WBC 15.0 (*)    Platelets 463 (*)    Neutro Abs 10.5 (*)    All other components  within normal limits  BASIC METABOLIC PANEL - Abnormal; Notable for the following components:   Glucose, Bld 112 (*)    All other components within normal limits  RESP PANEL BY RT-PCR (FLU A&B, COVID) ARPGX2  ETHANOL  RAPID URINE DRUG SCREEN, HOSP PERFORMED    EKG None  Radiology No results found.  Procedures Procedures  {Document cardiac monitor, telemetry assessment procedure when appropriate:1}  Medications Ordered in ED Medications - No data to display  ED Course/ Medical Decision Making/ A&P Patient with schizophrenia and is medically cleared but is having auditory hallucinations and was seen by behavioral health                         Medical Decision Making Amount and/or Complexity of Data Reviewed Labs: ordered.   Patient with worsening schizophrenia with hallucinations  {Document critical care time when appropriate:1} {Document review of labs and clinical decision tools ie heart score, Chads2Vasc2 etc:1}  {Document your independent review of radiology images, and any outside records:1} {Document your discussion with family members, caretakers, and with consultants:1} {Document social determinants of health affecting pt's care:1} {Document your decision making why or why not admission, treatments were needed:1} Final Clinical Impression(s) / ED Diagnoses Final diagnoses:  None    Rx / DC Orders ED Discharge Orders     None
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
## 2021-11-24 LAB — RESP PANEL BY RT-PCR (FLU A&B, COVID) ARPGX2
Influenza A by PCR: NEGATIVE
Influenza B by PCR: NEGATIVE
SARS Coronavirus 2 by RT PCR: NEGATIVE

## 2021-11-24 NOTE — ED Notes (Signed)
Pt seen walking out the ED, pt states that he is not going to stay and he is going to walk home, pt states "I am ok and I will be back" denies SI, HI.

## 2021-11-24 NOTE — BH Assessment (Signed)
Comprehensive Clinical Assessment (CCA) Note  11/24/2021 [name] [id number]  DISPOSITION: Gave clinical report to James Jarvis, PA who recommended Pt be observed overnight and evaluated by psychiatry in the morning. Pt will be reviewed for possible transfer to [facility] for continuous assessment. Notified Dr [name] and James Scheuermann, RN of recommendation via secure message.  The patient demonstrates the following risk factors for suicide: Chronic risk factors for suicide include: psychiatric disorder of schizophrenia . Acute risk factors for suicide include: unemployment. Protective factors for this patient include: positive social support. Considering these factors, the overall suicide risk at this point appears to be low. Patient is appropriate for outpatient follow up.  Flowsheet Row ED from 11/23/2021 in [facility] EMERGENCY DEPARTMENT ED from 11/22/2021 in [facility] EMERGENCY DEPARTMENT ED to Hosp-Admission (Discharged) from 11/16/2021 in [facility] MEDICAL SURGICAL UNIT  C-SSRS RISK CATEGORY No Risk No Risk No Risk      Pt is a 46 year old single male who presents unaccompanied to [facility] ED saying he needs a mental health evaluation because he is paranoid about his mother. Pt has a diagnosis of schizophrenia and has presented to emergency services several times recently. Tonight he has disorganized thought process, flight of ideas, loose association, and delusions. He rambles about how when he smoked cigarettes it can cause tornados, that people from space are doing something to his phone, that he was trying to call [name] [name]'s daughter, and that something happened to him in a past life. Pt can answer direct questions appropriate but then pursues a different topic. He says he does hear voices that tell him different things and appears to believe things said on the television are meant for him specifically. He denies use of alcohol or substances other than tobacco. He describes  his mood as "good". He states he sleeps 4 hours per night and believes that is enough sleep. He reports his appetite is good. He denies current suicidal ideation or history of suicide attempts. He denies current homicidal ideation or history of violence.   Per medical record, Pt was recently inpatient at [facility]. He was medically admitted  at [facility] due to AMS and discharged 11/16/2021. He lives alone and manages his medications but appears to not be taking them regularly. He provided verbal authorization to follow-up with his mother [name] at [phone] but there was no response. Pt says he has ACTT services but does not know which agency and says they have not spoken to him. He denies legal problems. He denies access to firearms.  Pt is casually dressed, alert and oriented to person and place. Pt speaks in a clear tone, at moderate volume and normal pace. Motor behavior appears normal. Eye contact is good. [name] mood is euthymic and affect is congruent with mood. [name] judgment is impaired and insight is poor. He was generally cooperative during assessment.   Chief Complaint:  Chief Complaint  Patient presents with   V70.1   Visit Diagnosis: F20.9 Schizophrenia   CCA Screening, Triage and Referral (STR)  Patient Reported Information How did you hear about [place]? Self  What Is the Reason for Your Visit/Call Today? Pt has diagnosis of schizophrenia and appears psychotic. He has disorganized thought process with delusions and auditory hallucinations. Pt said he came because he is paranoid that something is happening with his mother.  How Long Has This Been Causing You Problems? > than 6 months  What Do You Feel Would Help You the Most Today?  Treatment for Depression or other mood problem; Medication(s)   Have You Recently Had Any Thoughts About Hurting Yourself? No  Are You Planning to Commit Suicide/Harm Yourself At This time? No   Have you Recently Had Thoughts About  Hurting Someone Karolee Ohs? No  Are You Planning to Harm Someone at This Time? No  Explanation: No data recorded  Have You Used Any Alcohol or Drugs in the Past 24 Hours? No  How Long Ago Did You Use Drugs or Alcohol? No data recorded What Did You Use and How Much? No data recorded  Do You Currently Have a Therapist/Psychiatrist? Yes  Name of Therapist/Psychiatrist: Pt says he has ACTT services but says they don't talk to him.   Have You Been Recently Discharged From Any Office Practice or Programs? Yes  Explanation of Discharge From Practice/Program: Pt was discharged from a medical unit one week ago after presenting with altered mental status.     CCA Screening Triage Referral Assessment Type of Contact: Tele-Assessment  Telemedicine Service Delivery: Telemedicine service delivery: This service was provided via telemedicine using a 2-way, interactive audio and video technology  Is this Initial or Reassessment? Initial Assessment  Date Telepsych consult ordered in [facility]:  11/23/21  Time Telepsych consult ordered in [facility]:  2313  Location of Assessment: [facility] ED  Provider Location: [facility] [place] Assessment Services   Collateral Involvement: Unable to contact [name] mother   Does Patient Have a Automotive engineer Guardian? No data recorded Name and Contact of Legal Guardian: No data recorded If Minor and Not Living with Parent(s), Who has Custody? N/A  Is CPS involved or ever been involved? Never  Is APS involved or ever been involved? Never   Patient Determined To Be At Risk for Harm To Self or Others Based on Review of Patient Reported Information or Presenting Complaint? No  Method: No data recorded Availability of Means: No data recorded Intent: No data recorded Notification Required: No data recorded Additional Information for Danger to Others Potential: No data recorded Additional Comments for Danger to Others Potential: No data recorded Are There Guns or Other Weapons in  Your Home? No data recorded Types of Guns/Weapons: No data recorded Are These Weapons Safely Secured?                            No data recorded Who Could Verify You Are Able To Have These Secured: No data recorded Do You Have any Outstanding Charges, Pending Court Dates, Parole/Probation? No data recorded Contacted To Inform of Risk of Harm To Self or Others: Law Enforcement    Does Patient Present under Involuntary Commitment? No  IVC Papers Initial File Date: 11/02/21   [place] of Residence: [place]   Patient Currently Receiving the Following Services: ACTT Psychologist, educational)   Determination of Need: Urgent (48 hours)   Options For Referral: [facility]; Inpatient Hospitalization; Outpatient Therapy; Medication Management     CCA Biopsychosocial Patient Reported Schizophrenia/Schizoaffective Diagnosis in Past: Yes   Strengths: Pt is pleasant and talks openly about his concerns   Mental Health Symptoms Depression:   Change in energy/activity; Difficulty Concentrating; Sleep (too much or little)   Duration of Depressive symptoms:  Duration of Depressive Symptoms: Greater than two weeks   Mania:   None (UTA)   Anxiety:    Tension; Sleep; Worrying; Restlessness; Difficulty concentrating   Psychosis:   Delusions; Hallucinations   Duration of Psychotic symptoms:  Duration of  Psychotic Symptoms: Greater than six months   Trauma:   None   Obsessions:   None   Compulsions:   None   Inattention:   N/A   Hyperactivity/Impulsivity:   N/A   Oppositional/Defiant Behaviors:   N/A   Emotional Irregularity:   Transient, stress-related paranoia/disassociation   Other Mood/Personality Symptoms:   UTA    Mental Status Exam Appearance and self-care  Stature:   Average   Weight:   Average weight   Clothing:   Casual   Grooming:   Neglected   Cosmetic use:   None   Posture/gait:   Normal   Motor activity:   Not  Remarkable   Sensorium  Attention:   Distractible   Concentration:   Scattered   Orientation:   Person; Place   Recall/memory:   Defective in Short-term; Defective in Recent   Affect and Mood  Affect:   Appropriate   Mood:   Euthymic   Relating  Eye contact:   Normal   Facial expression:   Anxious; Responsive   Attitude toward examiner:   Cooperative   Thought and Language  Speech flow:  Flight of Ideas   Thought content:   Delusions   Preoccupation:   None   Hallucinations:   Auditory   Organization:  No data recorded  [employer] of Knowledge:   Fair   Intelligence:   Needs investigation   Abstraction:   Abstract   Judgement:   Impaired   Reality Testing:   Distorted   Insight:   Lacking   Decision Making:   Impulsive; Vacilates   Social Functioning  Social Maturity:   Impulsive   Social Judgement:   Heedless   Stress  Stressors:   Relationship; Financial   Coping Ability:   Overwhelmed   Skill Deficits:   Interpersonal; Decision making; Communication; Self-control   Supports:   Family     Religion: Religion/Spirituality Are You A Religious Person?: Yes What is Your Religious Affiliation?: Christian How Might This Affect Treatment?: UTA  Leisure/Recreation: Leisure / Recreation Do You Have Hobbies?: Yes Leisure and Hobbies: Per chart review, patient enjoys playing video games.  Exercise/Diet: Exercise/Diet Do You Exercise?: No Have You Gained or Lost A Significant Amount of Weight in the Past Six Months?: No Do You Follow a Special Diet?: No Do You Have Any Trouble Sleeping?: Yes Explanation of Sleeping Difficulties: Sleeps 4-5 hours per night, however states "it's enough, all I need."   CCA Employment/Education Employment/Work Situation: Employment / Work Situation Employment Situation: On disability How Long has Patient Been on Disability: Per chart, since 1998 Patient's Job has Been  Impacted by Current Illness: No Has Patient ever Been in the [employer]?: No  Education: Education Is Patient Currently Attending School?: No Did Theme park manager?: No Did You Have An Individualized Education Program (IIEP):  (UTA) Did You Have Any Difficulty At School?:  (UTA) Patient's Education Has Been Impacted by Current Illness:  (UTA)   CCA Family/Childhood History Family and Relationship History: Family history Marital status: Single  Childhood History:  Childhood History By whom was/is the patient raised?: Both parents Did patient suffer any verbal/emotional/physical/sexual abuse as a child?: No Did patient suffer from severe childhood neglect?: No Has patient ever been sexually abused/assaulted/raped as an adolescent or adult?: No Was the patient ever a victim of a crime or a disaster?: No Witnessed domestic violence?: No Has patient been affected by domestic violence as an adult?: No  Child/Adolescent Assessment:  CCA Substance Use Alcohol/Drug Use: Alcohol / Drug Use Pain Medications: See MAR Prescriptions: See MAR Over the Counter: See MAR History of alcohol / drug use?: No history of alcohol / drug abuse Longest period of sobriety (when/how long): Patient states that he has been free of alcohol and dugs for the past 6-7 years.  UDS and BAL are negative.                         ASAM's:  Six Dimensions of Multidimensional Assessment  Dimension 1:  Acute Intoxication and/or Withdrawal Potential:      Dimension 2:  Biomedical Conditions and Complications:      Dimension 3:  Emotional, Behavioral, or Cognitive Conditions and Complications:     Dimension 4:  Readiness to Change:     Dimension 5:  Relapse, Continued use, or Continued Problem Potential:     Dimension 6:  Recovery/Living Environment:     ASAM Severity Score:    ASAM Recommended Level of Treatment:     Substance use Disorder (SUD)    Recommendations for  Services/Supports/Treatments:    Discharge Disposition:    DSM5 Diagnoses: Patient Active Problem List   Diagnosis Date Noted   SIRS (systemic inflammatory response syndrome) (HCC) 11/17/2021   Hypokalemia 11/17/2021   Dehydration 11/17/2021   AKI (acute kidney injury) - treated and resolved 11/16/2021   Overdose, intentional self-harm, initial encounter (HCC) 02/24/2020   Schizophrenia (HCC) 02/24/2020   Overdose, undetermined intent, initial encounter 02/23/2020   Overdose 02/23/2020   Paranoid schizophrenia (HCC) 02/02/2020   Schizophrenia, paranoid (HCC) 05/31/2019   Tobacco use disorder    Schizoaffective disorder, bipolar type (HCC) 06/29/2014     Referrals to Alternative Service(s): Referred to Alternative Service(s):   Place:   Date:   Time:    Referred to Alternative Service(s):   Place:   Date:   Time:    Referred to Alternative Service(s):   Place:   Date:   Time:    Referred to Alternative Service(s):   Place:   Date:   Time:     Pamalee Leyden, [facility]

## 2021-11-25 ENCOUNTER — Encounter (HOSPITAL_COMMUNITY): Payer: Self-pay

## 2021-11-25 ENCOUNTER — Emergency Department (HOSPITAL_COMMUNITY)
Admission: EM | Admit: 2021-11-25 | Discharge: 2021-11-25 | Disposition: A | Discharge status: Home / Self Care | Payer: Medicare Other | Attending: Emergency Medicine | Admitting: Emergency Medicine

## 2021-11-25 ENCOUNTER — Other Ambulatory Visit: Payer: Self-pay

## 2021-11-25 DIAGNOSIS — R4182 Altered mental status, unspecified: Secondary | ICD-10-CM | POA: Diagnosis present

## 2021-11-25 DIAGNOSIS — Z5900 Homelessness unspecified: Secondary | ICD-10-CM | POA: Insufficient documentation

## 2021-11-25 DIAGNOSIS — F209 Schizophrenia, unspecified: Secondary | ICD-10-CM | POA: Diagnosis not present

## 2021-11-25 DIAGNOSIS — Z7984 Long term (current) use of oral hypoglycemic drugs: Secondary | ICD-10-CM | POA: Diagnosis not present
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
## 2021-11-25 NOTE — ED Provider Notes (Signed)
  [facility] EMERGENCY DEPARTMENT Provider Note   CSN: [id number] Arrival date & time: 11/25/21  1814     History {Add pertinent medical, surgical, social history, OB history to HPI:1} No chief complaint on file.   [name] is a 46 y.o. male.  Patient has a history of schizophrenia.  He is homeless right now and stated he will have a place tomorrow to live in.  He is not having any auditory or visual hallucinations and is not suicidal or homicidal   Altered Mental Status      Home Medications Prior to Admission medications   Medication Sig Start Date End Date Taking? Authorizing Provider  ABILIFY MAINTENA 400 MG PRSY prefilled syringe Inject 400 mg into the muscle every 28 (twenty-eight) days. 09/25/21   [provider]  atorvastatin (LIPITOR) 40 MG tablet Take 40 mg by mouth at bedtime. Patient not taking: Reported on 11/02/2021 10/19/21   [provider]  ibuprofen (ADVIL) 800 MG tablet Take 800 mg by mouth 2 (two) times daily as needed for fever, mild pain or headache. 03/17/20   [provider]  metFORMIN (GLUCOPHAGE) 500 MG tablet Take 2 tablets (1,000 mg total) by mouth 2 (two) times daily with a meal. 11/22/21   Zadie Rhine, MD  naproxen (NAPROSYN) 500 MG tablet Take 1 tablet (500 mg total) by mouth 2 (two) times daily. 11/22/21   Zadie Rhine, MD      Allergies    Other    Review of Systems   Review of Systems  Physical Exam Updated Vital Signs BP 124/88 (BP Location: Right Arm)   Pulse 95   Temp 97.8 F (36.6 C) (Oral)   Ht 6\' 1"  (1.854 m)   Wt 116 kg   SpO2 99%   BMI 33.74 kg/m  Physical Exam  ED Results / Procedures / Treatments   Labs (all labs ordered are listed, but only abnormal results are displayed) Labs Reviewed - No data to display  EKG None  Radiology No results found.  Procedures Procedures  {Document cardiac monitor, telemetry assessment procedure when appropriate:1}  Medications Ordered in  ED Medications - No data to display  ED Course/ Medical Decision Making/ A&P                           Medical Decision Making  Patient homeless with schizophrenia but not hallucinating now.  He will be discharged to the waiting room and then tomorrow he can go to his new place  {Document critical care time when appropriate:1} {Document review of labs and clinical decision tools ie heart score, Chads2Vasc2 etc:1}  {Document your independent review of radiology images, and any outside records:1} {Document your discussion with family members, caretakers, and with consultants:1} {Document social determinants of health affecting pt's care:1} {Document your decision making why or why not admission, treatments were needed:1} Final Clinical Impression(s) / ED Diagnoses Final diagnoses:  Homeless    Rx / DC Orders ED Discharge Orders     None

## 2021-11-25 NOTE — Discharge Instructions (Signed)
He can wait out in the waiting room until tomorrow when you can then go to a new place

## 2021-11-26 ENCOUNTER — Emergency Department (HOSPITAL_COMMUNITY)
Admission: EM | Admit: 2021-11-26 | Discharge: 2021-11-26 | Disposition: A | Discharge status: Home / Self Care | Payer: Medicare Other | Attending: Emergency Medicine | Admitting: Emergency Medicine

## 2021-11-26 ENCOUNTER — Encounter (HOSPITAL_COMMUNITY): Payer: Self-pay | Admitting: *Deleted

## 2021-11-26 DIAGNOSIS — Z5900 Homelessness unspecified: Secondary | ICD-10-CM | POA: Diagnosis not present

## 2021-11-26 DIAGNOSIS — Z0000 Encounter for general adult medical examination without abnormal findings: Secondary | ICD-10-CM | POA: Insufficient documentation

## 2021-11-26 NOTE — ED Notes (Signed)
Patient verbalized discharge instructions. Pen pad would not work in the room.

## 2021-11-26 NOTE — Discharge Instructions (Signed)
Follow-up with your primary doctor as needed. °

## 2021-11-26 NOTE — ED Provider Notes (Signed)
[facility] EMERGENCY DEPARTMENT Provider Note   CSN: [id number] Arrival date & time: 11/26/21  0143     History  Chief Complaint  Patient presents with   Follow-up    [name] is a 46 y.o. male.  Patient is a 46 year old male with past medical history of schizoaffective disorder.  Patient well-known to the emergency department for frequent visits involving food and shelter.  Patient seen earlier this evening and was discharged.  He has now returned stating that he has nowhere to go.  He is requesting food, a shower, and a toothbrush.  He denies any complaints.  He denies to me that he is suicidal or homicidal.  The history is provided by the patient.       Home Medications Prior to Admission medications   Medication Sig Start Date End Date Taking? Authorizing Provider  ABILIFY MAINTENA 400 MG PRSY prefilled syringe Inject 400 mg into the muscle every 28 (twenty-eight) days. 09/25/21   [provider]  atorvastatin (LIPITOR) 40 MG tablet Take 40 mg by mouth at bedtime. Patient not taking: Reported on 11/02/2021 10/19/21   [provider]  ibuprofen (ADVIL) 800 MG tablet Take 800 mg by mouth 2 (two) times daily as needed for fever, mild pain or headache. 03/17/20   [provider]  metFORMIN (GLUCOPHAGE) 500 MG tablet Take 2 tablets (1,000 mg total) by mouth 2 (two) times daily with a meal. 11/22/21   Zadie Rhine, MD  naproxen (NAPROSYN) 500 MG tablet Take 1 tablet (500 mg total) by mouth 2 (two) times daily. 11/22/21   Zadie Rhine, MD      Allergies    Other    Review of Systems   Review of Systems  All other systems reviewed and are negative.   Physical Exam Updated Vital Signs BP 124/78   Pulse 93   Temp 98 F (36.7 C)   Resp 18   Ht 6\' 1"  (1.854 m)   Wt 116 kg   SpO2 99%   BMI 33.74 kg/m  Physical Exam Vitals and nursing note reviewed.  Constitutional:      Appearance: Normal appearance.  HENT:     Head: Normocephalic  and atraumatic.  Pulmonary:     Effort: Pulmonary effort is normal.  Skin:    General: Skin is warm and dry.  Neurological:     Mental Status: He is alert. Mental status is at baseline.  Psychiatric:        Attention and Perception: Attention normal.        Mood and Affect: Mood normal.        Speech: Speech normal.        Behavior: Behavior normal. Behavior is cooperative.        Thought Content: Thought content does not include homicidal or suicidal ideation. Thought content does not include homicidal or suicidal plan.        Cognition and Memory: Cognition normal.        Judgment: Judgment is inappropriate.     ED Results / Procedures / Treatments   Labs (all labs ordered are listed, but only abnormal results are displayed) Labs Reviewed - No data to display  EKG None  Radiology No results found.  Procedures Procedures  {Document cardiac monitor, telemetry assessment procedure when appropriate:1}  Medications Ordered in ED Medications - No data to display  ED Course/ Medical Decision Making/ A&P  Medical Decision Making  ***  {Document critical care time when appropriate:1} {Document review of labs and clinical decision tools ie heart score, Chads2Vasc2 etc:1}  {Document your independent review of radiology images, and any outside records:1} {Document your discussion with family members, caretakers, and with consultants:1} {Document social determinants of health affecting pt's care:1} {Document your decision making why or why not admission, treatments were needed:1} Final Clinical Impression(s) / ED Diagnoses Final diagnoses:  None    Rx / DC Orders ED Discharge Orders     None

## 2021-11-26 NOTE — ED Notes (Signed)
ED Provider at bedside. 

## 2021-11-26 NOTE — ED Triage Notes (Signed)
Pt returns to er stating that he wants to be checked out, when asked what he wants to be checked out for he states" anything"

## 2021-11-26 NOTE — ED Notes (Signed)
Pt inquiring about when breakfast will be served to him in the hospital. RN informed Pt it would occur during the beginning of the next shift.

## 2021-11-26 NOTE — ED Notes (Signed)
Prior to registering for treatment, pt asked registration for a place to stay, toothbrush and a place to shower, registration advised that the hospital could not be able give out supplies without being a pt in the hospital,

## 2021-11-28 ENCOUNTER — Emergency Department (HOSPITAL_COMMUNITY)
Admission: EM | Admit: 2021-11-28 | Discharge: 2021-11-29 | Discharge status: Against Medical Advice | Payer: Medicare Other | Attending: Emergency Medicine | Admitting: Emergency Medicine

## 2021-11-28 ENCOUNTER — Other Ambulatory Visit: Payer: Self-pay

## 2021-11-28 ENCOUNTER — Encounter (HOSPITAL_COMMUNITY): Payer: Self-pay | Admitting: Emergency Medicine

## 2021-11-28 DIAGNOSIS — R456 Violent behavior: Secondary | ICD-10-CM | POA: Insufficient documentation

## 2021-11-28 DIAGNOSIS — F250 Schizoaffective disorder, bipolar type: Secondary | ICD-10-CM | POA: Insufficient documentation

## 2021-11-28 DIAGNOSIS — F17210 Nicotine dependence, cigarettes, uncomplicated: Secondary | ICD-10-CM | POA: Insufficient documentation

## 2021-11-28 DIAGNOSIS — E785 Hyperlipidemia, unspecified: Secondary | ICD-10-CM | POA: Diagnosis not present

## 2021-11-28 DIAGNOSIS — Z20822 Contact with and (suspected) exposure to covid-19: Secondary | ICD-10-CM | POA: Insufficient documentation

## 2021-11-28 DIAGNOSIS — R443 Hallucinations, unspecified: Secondary | ICD-10-CM | POA: Diagnosis present

## 2021-11-28 DIAGNOSIS — Z1339 Encounter for screening examination for other mental health and behavioral disorders: Secondary | ICD-10-CM | POA: Insufficient documentation

## 2021-11-28 DIAGNOSIS — Z7984 Long term (current) use of oral hypoglycemic drugs: Secondary | ICD-10-CM | POA: Insufficient documentation

## 2021-11-28 LAB — CBC
HCT: 36.5 % — ABNORMAL LOW (ref 39.0–52.0)
Hemoglobin: 12.6 g/dL — ABNORMAL LOW (ref 13.0–17.0)
MCH: 30.1 pg (ref 26.0–34.0)
MCHC: 34.5 g/dL (ref 30.0–36.0)
MCV: 87.1 fL (ref 80.0–100.0)
Platelets: 325 10*3/uL (ref 150–400)
RBC: 4.19 MIL/uL — ABNORMAL LOW (ref 4.22–5.81)
RDW: 12.4 % (ref 11.5–15.5)
WBC: 9.4 10*3/uL (ref 4.0–10.5)
nRBC: 0 % (ref 0.0–0.2)

## 2021-11-28 LAB — COMPREHENSIVE METABOLIC PANEL
ALT: 27 U/L (ref 0–44)
AST: 28 U/L (ref 15–41)
Albumin: 3.6 g/dL (ref 3.5–5.0)
Alkaline Phosphatase: 66 U/L (ref 38–126)
Anion gap: 6 (ref 5–15)
BUN: 18 mg/dL (ref 6–20)
CO2: 25 mmol/L (ref 22–32)
Calcium: 8.8 mg/dL — ABNORMAL LOW (ref 8.9–10.3)
Chloride: 105 mmol/L (ref 98–111)
Creatinine, Ser: 0.84 mg/dL (ref 0.61–1.24)
GFR, Estimated: >60 mL/min (ref >60)
Glucose, Bld: 97 mg/dL (ref 70–99)
Potassium: 3.4 mmol/L — ABNORMAL LOW (ref 3.5–5.1)
Sodium: 136 mmol/L (ref 135–145)
Total Bilirubin: 0.3 mg/dL (ref 0.3–1.2)
Total Protein: 6.8 g/dL (ref 6.5–8.1)

## 2021-11-28 LAB — SALICYLATE LEVEL: Salicylate Lvl: <7 mg/dL — ABNORMAL LOW (ref 7.0–30.0)

## 2021-11-28 LAB — ACETAMINOPHEN LEVEL: Acetaminophen (Tylenol), Serum: 21 ug/mL (ref 10–30)

## 2021-11-28 LAB — ETHANOL: Alcohol, Ethyl (B): <10 mg/dL (ref <10)

## 2021-11-28 NOTE — ED Notes (Signed)
Security called to wand pt  

## 2021-11-28 NOTE — ED Triage Notes (Addendum)
Pt BIB [facility] for hallucinations; Mother called out stating pt was seeing spiders, Star Wars, implants and talking out of his head; upon SD arrival pt locked himself in his room; pt talking about spiders, DNA and sickle cell traits and seeing Mickey Mouse and Spiderman during triage; denies HI/SI; IVC paperwork has not been started pta

## 2021-11-28 NOTE — ED Provider Notes (Addendum)
[facility] EMERGENCY DEPARTMENT Provider Note   CSN: [id number] Arrival date & time: 11/28/21  2146     History  Chief Complaint  Patient presents with   Hallucinations    [name] is a 46 y.o. male.  HPI Patient here with law enforcement, who states they were at a home where the patient was staying with his mother for the last 3 days.  His mother called law enforcement because the patient was "talking out of his head."  The patient was reported to be talking about movies, implants in his body and at 1 point locked himself into a room and would not talk to law enforcement.  Law enforcement felt he was at risk so they restrain him with cuffs and brought him here.  There were no IVC papers, and no one else is here with the patient.  On prior visits to the ED, multiple times, the patient has been stable with chronic psychiatric illness and reported to be homeless.  The patient states that he has not seen his psychiatrist recently.  He is unable to specify if he is taking any medications.    Home Medications Prior to Admission medications   Medication Sig Start Date End Date Taking? Authorizing Provider  ABILIFY MAINTENA 400 MG PRSY prefilled syringe Inject 400 mg into the muscle every 28 (twenty-eight) days. 09/25/21   [provider]  atorvastatin (LIPITOR) 40 MG tablet Take 40 mg by mouth at bedtime. Patient not taking: Reported on 11/02/2021 10/19/21   [provider]  ibuprofen (ADVIL) 800 MG tablet Take 800 mg by mouth 2 (two) times daily as needed for fever, mild pain or headache. 03/17/20   [provider]  metFORMIN (GLUCOPHAGE) 500 MG tablet Take 2 tablets (1,000 mg total) by mouth 2 (two) times daily with a meal. 11/22/21   Zadie Rhine, MD  naproxen (NAPROSYN) 500 MG tablet Take 1 tablet (500 mg total) by mouth 2 (two) times daily. 11/22/21   Zadie Rhine, MD      Allergies    Other    Review of Systems   Review of Systems  Physical  Exam Updated Vital Signs BP 119/76 (BP Location: Right Arm)   Pulse 96   Temp 97.9 F (36.6 C) (Temporal)   Resp 19   Ht 6\' 1"  (1.854 m)   Wt 115.7 kg   SpO2 98%   BMI 33.64 kg/m  Physical Exam Vitals and nursing note reviewed.  Constitutional:      General: He is not in acute distress.    Appearance: He is well-developed. He is not ill-appearing or diaphoretic.  HENT:     Head: Normocephalic and atraumatic.     Right Ear: External ear normal.     Left Ear: External ear normal.  Eyes:     Conjunctiva/sclera: Conjunctivae normal.     Pupils: Pupils are equal, round, and reactive to light.  Neck:     Trachea: Phonation normal.  Cardiovascular:     Rate and Rhythm: Normal rate.  Pulmonary:     Effort: Pulmonary effort is normal.  Abdominal:     General: There is no distension.  Musculoskeletal:        General: Normal range of motion.     Cervical back: Normal range of motion and neck supple.  Skin:    General: Skin is warm and dry.  Neurological:     Mental Status: He is alert and oriented to person, place, and time.  Cranial Nerves: No cranial nerve deficit.     Sensory: No sensory deficit.     Motor: No abnormal muscle tone.     Coordination: Coordination normal.  Psychiatric:        Attention and Perception: He is attentive.        Mood and Affect: Mood normal.        Speech: He is communicative.        Behavior: Behavior is cooperative.        Thought Content: Thought content is delusional. Thought content is not paranoid. Thought content does not include suicidal ideation. Thought content does not include homicidal or suicidal plan.        Cognition and Memory: Cognition is impaired.        Judgment: Judgment is inappropriate.     ED Results / Procedures / Treatments   Labs (all labs ordered are listed, but only abnormal results are displayed) Labs Reviewed  COMPREHENSIVE METABOLIC PANEL - Abnormal; Notable for the following components:      Result  Value   Potassium 3.4 (*)    Calcium 8.8 (*)    All other components within normal limits  SALICYLATE LEVEL - Abnormal; Notable for the following components:   Salicylate Lvl <7.0 (*)    All other components within normal limits  CBC - Abnormal; Notable for the following components:   RBC 4.19 (*)    Hemoglobin 12.6 (*)    HCT 36.5 (*)    All other components within normal limits  ETHANOL  ACETAMINOPHEN LEVEL  RAPID URINE DRUG SCREEN, HOSP PERFORMED    EKG None  Radiology No results found.  Procedures Procedures    Medications Ordered in ED Medications - No data to display  ED Course/ Medical Decision Making/ A&P Clinical Course as of 11/28/21 2332  Wed Nov 28, 2021  2243 I attempted to call the patient's mother, [name].  Her number is listed in the EMR.  The phone rang to an answering service but I was unable to leave a message because it was full. [EW]  2331 At this point patient medically cleared for treatment by psychiatry. [EW]    Clinical Course User Index [EW] Mancel Bale, MD                           Medical Decision Making Patient who is intermittently homeless, was with his mother today who was concerned that he was unstable so called law enforcement.  They brought him here, with handcuffs on.  He was not under commitment.  On initial evaluation he is delusional but calm and cooperative.  He is a very poor historian.  He presents appearing calm and comfortable.  He is not disheveled or showing signs of injury.  Per the EMR he received his 30-day dose of Abilify on 11/13/2021.  Amount and/or Complexity of Data Reviewed External Data Reviewed: notes.    Details: Most recent evaluation by TTS was on 11/24/2021.  At that point the patient was stating he was afraid of his mother.  It was noted that he had a recent inpatient psychiatric hospitalization in July 2023.  Following that evaluation he left the department stating he was okay and would walk home.   He returned to the ED on 7/30, and 7/31, each time leaving after short period of ED observation. Labs: ordered.    Details: CBC, metabolic panel, alcohol level, urine drug screen-normal except potassium slightly low,  calcium low, hemoglobin low Discussion of management or test interpretation with external provider(s): TTS consultation  Risk Decision regarding hospitalization. Diagnosis or treatment significantly limited by social determinants of health. Risk Details: Patient with chronic psychiatric illness, presenting with delusions, brought in by law enforcement after he was with his mother who was concerned about him.  She is not with him.  He is not under commitment.  I attempted to call his mother who did not answer the phone and I was unable to leave a message on her machine.  Since the patient was not fully evaluated by TTS on prior visit, 11/23/2021.  I will consult TTS to have them evaluate the patient to see about additional services.  Screening labs done, patient medically cleared.  No significant metabolic abnormalities requiring treatment in the ED.               Final Clinical Impression(s) / ED Diagnoses Final diagnoses:  Schizoaffective disorder, bipolar type [facility])    Rx / DC Orders ED Discharge Orders     None         Mancel Bale, MD 11/28/21 2245    Mancel Bale, MD 11/28/21 2332

## 2021-11-29 ENCOUNTER — Emergency Department (EMERGENCY_DEPARTMENT_HOSPITAL)
Admission: EM | Admit: 2021-11-29 | Discharge: 2021-12-07 | Disposition: A | Discharge status: Home / Self Care | Payer: Medicare Other | Source: Home / Self Care | Attending: Student | Admitting: Student

## 2021-11-29 ENCOUNTER — Telehealth: Payer: Self-pay

## 2021-11-29 DIAGNOSIS — Z1339 Encounter for screening examination for other mental health and behavioral disorders: Secondary | ICD-10-CM | POA: Insufficient documentation

## 2021-11-29 DIAGNOSIS — F17210 Nicotine dependence, cigarettes, uncomplicated: Secondary | ICD-10-CM | POA: Insufficient documentation

## 2021-11-29 DIAGNOSIS — Z20822 Contact with and (suspected) exposure to covid-19: Secondary | ICD-10-CM | POA: Insufficient documentation

## 2021-11-29 DIAGNOSIS — F29 Unspecified psychosis not due to a substance or known physiological condition: Secondary | ICD-10-CM | POA: Insufficient documentation

## 2021-11-29 DIAGNOSIS — F250 Schizoaffective disorder, bipolar type: Secondary | ICD-10-CM | POA: Insufficient documentation

## 2021-11-29 DIAGNOSIS — R456 Violent behavior: Secondary | ICD-10-CM | POA: Insufficient documentation

## 2021-11-29 NOTE — ED Notes (Signed)
Tts talking to patient

## 2021-11-29 NOTE — Progress Notes (Signed)
Inpatient Behavioral Health Placement  Pt meets inpatient criteria per Liborio Nixon, NP.  Referral was sent to the following facilities;   Destination Service Provider Address Phone Fax  [facility]  [address] [place] [address] [phone]  [facility]  [address] [place] [address] [phone]  [facility]  [address] [place], [place] [address] [phone]  [facility]  [address] [place] [address] [phone]  [facility]  [address] [place] [address] [phone]  [facility]  [address], [place] [address] [phone]  [facility]  [address] [place] [address] [phone]  [facility]  [address] [place] [address] [phone]  [facility]  [address] [place] [address] [phone]  [facility]  [address] [place] [address] [phone]  [facility]  [address] [place] [address] [phone]  [facility]  [address] [place], [place] [address] [phone]  [facility]  [address] [place] [address] [phone]    Situation ongoing,  CSW will follow up.   Maryjean Ka, MSW, [facility] 11/29/2021  @ 11:09 PM

## 2021-11-29 NOTE — ED Notes (Signed)
Turned over care to Christian, medic 

## 2021-11-29 NOTE — ED Notes (Signed)
Pt ambulated to the restroom.

## 2021-11-29 NOTE — ED Notes (Signed)
Pt care taken no complaints.

## 2021-11-29 NOTE — ED Notes (Signed)
Pt. Belongings placed in Pt. Belonging bag.  - shoes - pants - belt -[place] -t-shirt -watch -phone

## 2021-11-29 NOTE — BH Assessment (Signed)
@  9390, requested patient's nurse Sue Lush, RN) to set up the TTS machine for patient's initial TTS assessment.

## 2021-11-29 NOTE — ED Notes (Signed)
Pt care taken, no complaints at this time. 

## 2021-11-29 NOTE — ED Notes (Signed)
[facility] at bedside to serve IVC paperwork.

## 2021-11-29 NOTE — BH Assessment (Addendum)
Comprehensive Clinical Assessment (CCA) Note  11/29/2021 ANIRUDDH HARTLIEB [id number] Disposition: Clinician discussed patient care with [name], NP.  She recommends inpatient psychiatric care for patient. Clinician informe dRN Lou Merricks of disposition via [employer].    Pt has distorted thought patterns.  He has a difficult time answering questions clearly w/o his trying to change the subject.  Patient appears to be responding to internal stimuli.  He has delusional thoughts.  His appetite is WNL and his sleep is not good.  Pt is incoherent most of the time.   Pt has last had a [place] IM shoot on 07/17.     Chief Complaint:  Chief Complaint  Patient presents with   Hallucinations   Visit Diagnosis:      CCA Screening, Triage and Referral (STR)  Patient Reported Information How did you hear about [place]? Family/Friend (Mother called police)  What Is the Reason for Your Visit/Call Today? Pt has a diagnosis of schizophrenia and does appear psychotic.  He talks about his mother taking money from his apartment.  Patient denies any SI or HI.  He does say that he hears voices.  Patient talks also about how his mother killed the singer Dale Marshallberg.  Pt says he has medication but it is at his apartment.  He is very disorganized in his thinking.  He talks about his family wanting to murder him and that Trump knows everything.  Clinician obtained permission to talk to mother.  Mother said tha tpatien thas been "acting different and all."  She does not think he has been taking his medication.  She said he has been going to the hospital three times lately but he has been released.  Mother said that a neighbor yesterday (08/01) saw patient walking in the road and not acting right.  Patient will switch the subject when he is asked about his mental health.  Mother says that she does not know if he actually has a ACTT team.  Mother took him to [facility] recently but he did not get much help.  Mother said  he has been to [facility] in 2023.  Pt has been staying at a hotel for a few weeks.  Mother said he is not able to stay with her because "it is too much on me."  Per NP [name], pt has had an [place] shot on 07/18.  How Long Has This Been Causing You Problems? > than 6 months  What Do You Feel Would Help You the Most Today? Treatment for Depression or other mood problem; Medication(s)   Have You Recently Had Any Thoughts About Hurting Yourself? No  Are You Planning to Commit Suicide/Harm Yourself At This time? No   Have you Recently Had Thoughts About Hurting Someone Karolee Ohs? No  Are You Planning to Harm Someone at This Time? No  Explanation: No data recorded  Have You Used Any Alcohol or Drugs in the Past 24 Hours? No  How Long Ago Did You Use Drugs or Alcohol? No data recorded What Did You Use and How Much? No data recorded  Do You Currently Have a Therapist/Psychiatrist? Yes  Name of Therapist/Psychiatrist: Unknown   Have You Been Recently Discharged From Any Office Practice or Programs? Yes  Explanation of Discharge From Practice/Program: Pt was on a medical unit last week.     CCA Screening Triage Referral Assessment Type of Contact: Tele-Assessment  Telemedicine Service Delivery:   Is this Initial or Reassessment? Initial Assessment  Date Telepsych consult  ordered in [facility]:  11/28/21  Time Telepsych consult ordered in [facility]:  2241  Location of Assessment: [facility] ED  Provider Location: [facility] Assessment Services   Collateral Involvement: mother [name] [phone]   Does Patient Have a Court Appointed Legal Guardian? No data recorded Name and Contact of Legal Guardian: No data recorded If Minor and Not Living with Parent(s), Who has Custody? N/A  Is CPS involved or ever been involved? Never  Is APS involved or ever been involved? Never   Patient Determined To Be At Risk for Harm To Self or Others Based on Review of Patient Reported Information  or Presenting Complaint? No  Method: No data recorded Availability of Means: No data recorded Intent: No data recorded Notification Required: No data recorded Additional Information for Danger to Others Potential: No data recorded Additional Comments for Danger to Others Potential: No data recorded Are There Guns or Other Weapons in Your Home? No data recorded Types of Guns/Weapons: No data recorded Are These Weapons Safely Secured?                            No data recorded Who Could Verify You Are Able To Have These Secured: No data recorded Do You Have any Outstanding Charges, Pending Court Dates, Parole/Probation? No data recorded Contacted To Inform of Risk of Harm To Self or Others: Law Enforcement    Does Patient Present under Involuntary Commitment? No  IVC Papers Initial File Date: 11/02/21   [place] of Residence: [place]   Patient Currently Receiving the Following Services: ACTT Psychologist, educational)   Determination of Need: Urgent (48 hours)   Options For Referral: Inpatient Hospitalization     CCA Biopsychosocial Patient Reported Schizophrenia/Schizoaffective Diagnosis in Past: Yes   Strengths: Pt is pleasant and talks openly about his concerns   Mental Health Symptoms Depression:   Change in energy/activity; Difficulty Concentrating; Sleep (too much or little)   Duration of Depressive symptoms:    Mania:   None (UTA)   Anxiety:    Tension; Sleep; Worrying; Restlessness; Difficulty concentrating   Psychosis:   Delusions; Hallucinations   Duration of Psychotic symptoms:    Trauma:   None   Obsessions:   None   Compulsions:   None   Inattention:   N/A   Hyperactivity/Impulsivity:   N/A   Oppositional/Defiant Behaviors:   N/A   Emotional Irregularity:   Transient, stress-related paranoia/disassociation   Other Mood/Personality Symptoms:   UTA    Mental Status Exam Appearance and self-care  Stature:   Average    Weight:   Average weight   Clothing:   Casual   Grooming:   Neglected   Cosmetic use:   None   Posture/gait:   Normal   Motor activity:   Not Remarkable   Sensorium  Attention:   Distractible   Concentration:   Scattered   Orientation:   Person; Place   Recall/memory:   Defective in Short-term; Defective in Recent   Affect and Mood  Affect:   Appropriate   Mood:   Euthymic   Relating  Eye contact:   Normal   Facial expression:   Anxious; Responsive   Attitude toward examiner:   Cooperative   Thought and Language  Speech flow:  Flight of Ideas   Thought content:   Delusions   Preoccupation:   None   Hallucinations:   Auditory   Organization:  No data recorded  Executive  Functions  Fund of Knowledge:   Fair   Intelligence:   Needs investigation   Abstraction:   Abstract   Judgement:   Impaired   Reality Testing:   Distorted   Insight:   Lacking   Decision Making:   Impulsive; Vacilates   Social Functioning  Social Maturity:   Impulsive   Social Judgement:   Heedless   Stress  Stressors:   Relationship; Financial   Coping Ability:   Overwhelmed   Skill Deficits:   Interpersonal; Decision making; Communication; Self-control   Supports:   Family     Religion:    Leisure/Recreation:    Exercise/Diet:     CCA Employment/Education Employment/Work Situation:    Education:     CCA Family/Childhood History Family and Relationship History:    Childhood History:     Child/Adolescent Assessment:     CCA Substance Use Alcohol/Drug Use:                           ASAM's:  Six Dimensions of Multidimensional Assessment  Dimension 1:  Acute Intoxication and/or Withdrawal Potential:      Dimension 2:  Biomedical Conditions and Complications:      Dimension 3:  Emotional, Behavioral, or Cognitive Conditions and Complications:     Dimension 4:  Readiness to Change:     Dimension  5:  Relapse, Continued use, or Continued Problem Potential:     Dimension 6:  Recovery/Living Environment:     ASAM Severity Score:    ASAM Recommended Level of Treatment:     Substance use Disorder (SUD)    Recommendations for Services/Supports/Treatments:    Discharge Disposition:    DSM5 Diagnoses: Patient Active Problem List   Diagnosis Date Noted   SIRS (systemic inflammatory response syndrome) (HCC) 11/17/2021   Hypokalemia 11/17/2021   Dehydration 11/17/2021   AKI (acute kidney injury) - treated and resolved 11/16/2021   Overdose, intentional self-harm, initial encounter (HCC) 02/24/2020   Schizophrenia (HCC) 02/24/2020   Overdose, undetermined intent, initial encounter 02/23/2020   Overdose 02/23/2020   Paranoid schizophrenia (HCC) 02/02/2020   Schizophrenia, paranoid (HCC) 05/31/2019   Tobacco use disorder    Schizoaffective disorder, bipolar type (HCC) 06/29/2014     Referrals to Alternative Service(s): Referred to Alternative Service(s):   Place:   Date:   Time:    Referred to Alternative Service(s):   Place:   Date:   Time:    Referred to Alternative Service(s):   Place:   Date:   Time:    Referred to Alternative Service(s):   Place:   Date:   Time:     Wandra Mannan

## 2021-11-29 NOTE — ED Notes (Signed)
Pt dressed out, security wanding Pt.

## 2021-11-29 NOTE — BH Assessment (Addendum)
Comprehensive Clinical Assessment (CCA) Note  11/29/2021 [name] [id number]  Disposition: TTS completed. Per [facility] provider Darrol Angel, NP), patient meets criteria for inpatient psychiatric treatment. Disposition Social Worker to seek appropriate placement.   Chief Complaint:  Chief Complaint  Patient presents with   Psychiatric Evaluation   Visit Diagnosis: Schizophrenia   [name] is a 46 y.o. male. Per H&P documentation: "[name] is a 46 y.o. male. Patient here with law enforcement, who states they were at a home where the patient was staying with his mother for the last 3 days.  His mother called law enforcement because the patient was "talking out of his head."  The patient was reported to be talking about movies, implants in his body and at 1 point locked himself into a room and would not talk to law enforcement.  Law enforcement felt he was at risk so they restrain him with cuffs and brought him here.  There were no IVC papers, and no one else is here with the patient.  On prior visits to the ED, multiple times, the patient has been stable with chronic psychiatric illness and reported to be homeless.  The patient states that he has not seen his psychiatrist recently.  He is unable to specify if he is taking any medications."  Upon chart review, patient has a diagnosis of Schizophrenia. Clinician met with patient via teleassessment to complete a TTS assessment. Pt has a diagnosis of schizophrenia and does appear psychotic. He appears to have tangential thinking and flight of ideas Additionally, displays general symptoms of speaking quickly and switching rapidly between ideas or thoughts that are not obviously connected. During our conversation, patient is illogical, nonsensical, and difficult to follow or understand. He required redirecting to focus and multiple requests from this Clinician to focus on the questions being asked. He is very disorganized in his thinking. He  talks about his family members wanting to hurt him. He mentions issues with his mother regarding his money. Also, says that he has his own apartment occupied by him and a younger brother.   Patient denies history of SI, HI, and AVH's. However, says that he has been hospitalized for psychiatric reasons on multiple occasions. According to patient he had a recent admission to [facility] and hospitalized at [facility] in 2013.  He does report having an ACTT provider through [facility].  However, says that he has not been able to get in touch with his workers. Denies history of alcohol and drug use. Per chart review, on a recent admission to the ED, patient stated that he has been free of alcohol and drugs for the past 6-7 years.   Patient is oriented to person and place. He states that it's "2027 or 2025". However, aware that it's the 8th month of the year and the month is August. His speech is rapid and pressure. His insight and judgement are poor. Memory is recent and remotely intact. Eye contact is fleeting. Patient does appear to be responding to internal stimuli and delusional.  Collateral Information from patient's mother [name]) [phone]: Clinician obtained permission to talk to mother as patient is a poor historian and provides limited details of his history. Clinician contacted patient's mother [name]) [phone]. States that he does have his own apartment in [place] and has lived in his apartment a few months. States that he was able to get this apartment on his own. However, she says the conditions are extremely poor.   Clinician  asked his mother about patient's baseline. However, she was unable to provide details for othis questions. She states, "I don't know, I just know I can't handle him, his father can't handle him either". She received noticed that 2-3 days ago patient destroyed his own apartment, and witnesses saw him throwing objects out of his apartment window.   Reportedly police showed up and he was taken to [place]. Per Epic notes patient did present to [facility] but did not want to stay and was escorted out of the building by police. According to his mother, patient was discharged from the hospital and came to her home in [place].   He has stayed in her home for the past 2 days. She has witnessed [name] talking to himself using foul language and says his speech is nonsensible.Also, patient reportedly destroyed her property, including her cell phone. His mother called police to have him  removed from her property. However, as she was contacting the police patient barricaded himself in her bedroom.  Police were able to talk him into removing the barricade. Patient then complied with the police request to allow them to transfer him to [facility] for a psychiatric evaluation. She denies any concerns for patient being a danger to himself. However, says that he has been aggressive with others and wants to fight people. His mother doesn't want him back in her home because she is unable to handle his behaviors. His mother verifies that patient is his own legal guardian.   CCA Screening, Triage and Referral (STR)  Patient Reported Information How did you hear about [place]? Family/Friend (Mother called police)  What Is the Reason for Your Visit/Call Today? Schizophrenia  How Long Has This Been Causing You Problems? > than 6 months  What Do You Feel Would Help You the Most Today? Treatment for Depression or other mood problem; Medication(s)   Have You Recently Had Any Thoughts About Hurting Yourself? No  Are You Planning to Commit Suicide/Harm Yourself At This time? No   Have you Recently Had Thoughts About [place]? No  Are You Planning to Harm Someone at This Time? No  Explanation: No data recorded  Have You Used Any Alcohol or Drugs in the Past 24 Hours? No  How Long Ago Did You Use Drugs or Alcohol? No data recorded What Did You Use and How Much? No  data recorded  Do You Currently Have a Therapist/Psychiatrist? Yes  Name of Therapist/Psychiatrist: Patient states that he has an ACTT provider with [facility] in [facility].   Have You Been Recently Discharged From Any Office Practice or Programs? Yes (Yes, per chart review.)  Explanation of Discharge From Practice/Program: Patient was on a medical unit last week, per ED notes.     CCA Screening Triage Referral Assessment Type of Contact: Tele-Assessment  Telemedicine Service Delivery: Telemedicine service delivery: This service was provided via telemedicine using a 2-way, interactive audio and video technology  Is this Initial or Reassessment? Initial Assessment  Date Telepsych consult ordered in [facility]:  11/29/21  Time Telepsych consult ordered in [facility]:  2241  Location of Assessment: [facility]  Provider Location: [facility] Assessment Services   Collateral Involvement: mother Hoyle Sauer [phone]   Does Patient Have a Court Appointed Legal Guardian? No data recorded Name and Contact of Legal Guardian: No data recorded If Minor and Not Living with Parent(s), Who has Custody? n/a  Is CPS involved or ever been involved? Never  Is APS involved or ever been involved?  Never   Patient Determined To Be At Risk for Harm To Self or Others Based on Review of Patient Reported Information or Presenting Complaint? No  Method: No data recorded Availability of Means: No data recorded Intent: No data recorded Notification Required: No data recorded Additional Information for Danger to Others Potential: No data recorded Additional Comments for Danger to Others Potential: No data recorded Are There Guns or Other Weapons in Your Home? No data recorded Types of Guns/Weapons: No data recorded Are These Weapons Safely Secured?                            No data recorded Who Could Verify You Are Able To Have These Secured: No data recorded Do You Have any Outstanding Charges,  Pending Court Dates, Parole/Probation? No data recorded Contacted To Inform of Risk of Harm To Self or Others: Law Enforcement    Does Patient Present under Involuntary Commitment? No  IVC Papers Initial File Date: 12/03/21   [place] of Residence: [place]   Patient Currently Receiving the Following Services: ACTT Architect)   Determination of Need: Urgent (48 hours)   Options For Referral: Inpatient Hospitalization; Medication Management     CCA Biopsychosocial Patient Reported Schizophrenia/Schizoaffective Diagnosis in Past: Yes   Strengths: Pt is pleasant and talks openly about his concerns   Mental Health Symptoms Depression:   Change in energy/activity; Difficulty Concentrating; Sleep (too much or little)   Duration of Depressive symptoms:    Mania:   None   Anxiety:    Tension; Sleep; Worrying; Restlessness; Difficulty concentrating   Psychosis:   Delusions; Hallucinations   Duration of Psychotic symptoms:  Duration of Psychotic Symptoms: Greater than six months   Trauma:   None   Obsessions:   None   Compulsions:   None   Inattention:   N/A   Hyperactivity/Impulsivity:   N/A   Oppositional/Defiant Behaviors:   N/A   Emotional Irregularity:   Transient, stress-related paranoia/disassociation   Other Mood/Personality Symptoms:   UTA    Mental Status Exam Appearance and self-care  Stature:   Average   Weight:   Average weight   Clothing:   Casual   Grooming:   Neglected   Cosmetic use:   None   Posture/gait:   Normal   Motor activity:   Not Remarkable   Sensorium  Attention:   Distractible   Concentration:   Scattered   Orientation:   Person; Place   Recall/memory:   Defective in Short-term; Defective in Recent   Affect and Mood  Affect:   Appropriate   Mood:   Euthymic   Relating  Eye contact:   None   Facial expression:   Anxious; Responsive   Attitude toward  examiner:   Cooperative   Thought and Language  Speech flow:  Flight of Ideas   Thought content:   Delusions   Preoccupation:   None   Hallucinations:   Auditory   Organization:  No data recorded  [employer] of Knowledge:   Fair   Intelligence:   Needs investigation   Abstraction:   Abstract   Judgement:   Impaired   Reality Testing:   Distorted   Insight:   Lacking   Decision Making:   Impulsive; Vacilates   Social Functioning  Social Maturity:   Impulsive   Social Judgement:   Heedless   Stress  Stressors:   Relationship; Financial   Coping  Ability:   Overwhelmed   Skill Deficits:   Interpersonal; Decision making; Communication; Self-control   Supports:   Family     Religion: Religion/Spirituality Are You A Religious Person?: Yes What is Your Religious Affiliation?: Christian How Might This Affect Treatment?: UTA  Leisure/Recreation: Leisure / Recreation Do You Have Hobbies?: Yes Leisure and Hobbies: Per chart review, patient enjoys playing video games.  Exercise/Diet: Exercise/Diet Do You Exercise?: No What Type of Exercise Do You Do?:  (unknown) How Many Times a Week Do You Exercise?:  (0) Have You Gained or Lost A Significant Amount of Weight in the Past Six Months?: No Do You Follow a Special Diet?: No Do You Have Any Trouble Sleeping?: Yes Explanation of Sleeping Difficulties: Sleeps 4-5 hours per night, however states "it's enough, all I need."   CCA Employment/Education Employment/Work Situation: Employment / Work Situation Employment Situation: On disability Why is Patient on Disability: Schizophrenia  How Long has Patient Been on Disability: Per chart, since 1998 Patient's Job has Been Impacted by Current Illness: No Has Patient ever Been in the [employer]?: No  Education: Education Is Patient Currently Attending School?: No Did Physicist, medical?: No Did You Have An Individualized Education  Program (IIEP): No Did You Have Any Difficulty At School?: No Patient's Education Has Been Impacted by Current Illness: No   CCA Family/Childhood History Family and Relationship History: Family history Marital status: Single Does patient have children?: No  Childhood History:  Childhood History By whom was/is the patient raised?: Both parents Did patient suffer any verbal/emotional/physical/sexual abuse as a child?: No Did patient suffer from severe childhood neglect?: No Has patient ever been sexually abused/assaulted/raped as an adolescent or adult?: No Was the patient ever a victim of a crime or a disaster?: No Witnessed domestic violence?: No Has patient been affected by domestic violence as an adult?: No  Child/Adolescent Assessment:     CCA Substance Use Alcohol/Drug Use: Alcohol / Drug Use Pain Medications: See MAR Prescriptions: See MAR Over the Counter: See MAR History of alcohol / drug use?: No history of alcohol / drug abuse Longest period of sobriety (when/how long): Patient states that he has been free of alcohol and dugs for the past 6-7 years.  UDS and BAL are negative.                         ASAM's:  Six Dimensions of Multidimensional Assessment  Dimension 1:  Acute Intoxication and/or Withdrawal Potential:      Dimension 2:  Biomedical Conditions and Complications:      Dimension 3:  Emotional, Behavioral, or Cognitive Conditions and Complications:     Dimension 4:  Readiness to Change:     Dimension 5:  Relapse, Continued use, or Continued Problem Potential:     Dimension 6:  Recovery/Living Environment:     ASAM Severity Score:    ASAM Recommended Level of Treatment:     Substance use Disorder (SUD)    Recommendations for Services/Supports/Treatments: Recommendations for Services/Supports/Treatments Recommendations For Services/Supports/Treatments: Individual Therapy, Medication Management, Inpatient Hospitalization, ACCTT (Assertive  Community Treatment)  Discharge Disposition:    DSM5 Diagnoses: Patient Active Problem List   Diagnosis Date Noted   SIRS (systemic inflammatory response syndrome) ([place]) 11/17/2021   Hypokalemia 11/17/2021   Dehydration 11/17/2021   AKI (acute kidney injury) - treated and resolved 11/16/2021   Overdose, intentional self-harm, initial encounter ([place]) 02/24/2020   Schizophrenia ([place]) 02/24/2020   Overdose, undetermined intent, initial encounter  02/23/2020   Overdose 02/23/2020   Paranoid schizophrenia ([place]) 02/02/2020   Schizophrenia, paranoid ([place]) 05/31/2019   Tobacco use disorder    Schizoaffective disorder, bipolar type ([place]) 06/29/2014     Referrals to Alternative Service(s): Referred to Alternative Service(s):   Place:   Date:   Time:    Referred to Alternative Service(s):   Place:   Date:   Time:    Referred to Alternative Service(s):   Place:   Date:   Time:    Referred to Alternative Service(s):   Place:   Date:   Time:     Waldon Merl, Counselor

## 2021-11-29 NOTE — ED Notes (Signed)
NP [name] recommends inpatient psychiatric care for patient.  CSW will assist with placement.

## 2021-11-29 NOTE — ED Notes (Addendum)
Pt wanted to leave, was escorted out side by security. Md aware
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
## 2021-11-29 NOTE — Telephone Encounter (Signed)
        Patient  visited [place] on 7/31  for BH/ Homelessness   Telephone encounter attempt :  1st   Could not leave a message    [facility] Guide, Embedded Care Coordination [facility], Care Management  [phone] [address] [place], [place], [place] [address] Phone: [phone] Email: [email or web address]

## 2021-11-30 ENCOUNTER — Encounter (HOSPITAL_COMMUNITY): Payer: Self-pay

## 2021-11-30 DIAGNOSIS — F250 Schizoaffective disorder, bipolar type: Secondary | ICD-10-CM | POA: Diagnosis not present

## 2021-11-30 MED ORDER — IBUPROFEN 800 MG PO TABS
800.0000 mg | ORAL_TABLET | Freq: Four times a day (QID) | ORAL | Status: DC | PRN
  Administered 2021-11-30 – 2021-12-01 (×3): 800 mg via ORAL
  Filled 2021-11-30 (×3): qty 1

## 2021-11-30 MED ORDER — NICOTINE 21 MG/24HR TD PT24
21.0000 mg | MEDICATED_PATCH | Freq: Every day | TRANSDERMAL | Status: DC
  Administered 2021-11-30 – 2021-12-07 (×8): 21 mg via TRANSDERMAL
  Filled 2021-11-30 (×8): qty 1

## 2021-11-30 NOTE — ED Provider Notes (Signed)
Emergency Medicine Observation Re-evaluation Note  [name] is a 46 y.o. male, seen on rounds today.  Pt initially presented to the ED for complaints of Psychiatric Evaluation Currently, the patient is awake and alert, requesting breakfast.  Physical Exam  BP 125/81   Pulse 75   Temp 98.9 F (37.2 C) (Oral)   Resp 18   SpO2 100%  Physical Exam General: No distress Cardiac: Regular rate and rhythm Lungs: No increased work of breathing Psych: Calm, pleasant  ED Course / MDM  EKG:   I have reviewed the labs performed to date as well as medications administered while in observation.  Recent changes in the last 24 hours include no changes.  Plan  Current plan is for placement. ELMAN DETTMAN is under involuntary commitment.      Gerhard Munch, MD 11/30/21 [phone]

## 2021-11-30 NOTE — ED Notes (Signed)
Pt noticed talking to himself quietly while lying in the bed.

## 2021-11-30 NOTE — ED Notes (Signed)
Pt taking a shower 

## 2021-11-30 NOTE — ED Notes (Signed)
Pt received dinner tray.

## 2021-12-01 DIAGNOSIS — F250 Schizoaffective disorder, bipolar type: Secondary | ICD-10-CM | POA: Diagnosis not present

## 2021-12-01 LAB — LIPID PANEL
Cholesterol: 130 mg/dL (ref 0–200)
HDL: 28 mg/dL — ABNORMAL LOW (ref >40)
LDL Cholesterol: 68 mg/dL (ref 0–99)
Total CHOL/HDL Ratio: 4.6 RATIO
Triglycerides: 172 mg/dL — ABNORMAL HIGH (ref <150)
VLDL: 34 mg/dL (ref 0–40)

## 2021-12-01 MED ORDER — BENZTROPINE MESYLATE 1 MG/ML IJ SOLN: 0.5000 mg | Freq: Two times a day (BID) | INTRAMUSCULAR | Status: DC | PRN

## 2021-12-01 MED ORDER — STERILE WATER FOR INJECTION IJ SOLN
INTRAMUSCULAR | Status: AC
  Administered 2021-12-01: 1.2 mL
  Filled 2021-12-01: qty 10

## 2021-12-01 MED ORDER — BENZTROPINE MESYLATE 1 MG PO TABS
0.5000 mg | ORAL_TABLET | Freq: Two times a day (BID) | ORAL | Status: DC | PRN
  Administered 2021-12-03 – 2021-12-07 (×5): 0.5 mg via ORAL
  Filled 2021-12-01 (×5): qty 1

## 2021-12-01 MED ORDER — LORAZEPAM 1 MG PO TABS
1.0000 mg | ORAL_TABLET | Freq: Once | ORAL | Status: AC
  Administered 2021-12-05: 1 mg via ORAL
  Filled 2021-12-01 (×2): qty 1

## 2021-12-01 MED ORDER — ZIPRASIDONE MESYLATE 20 MG IM SOLR
20.0000 mg | Freq: Once | INTRAMUSCULAR | Status: AC
  Administered 2021-12-01: 20 mg via INTRAMUSCULAR

## 2021-12-01 MED ORDER — DIVALPROEX SODIUM 250 MG PO DR TAB
500.0000 mg | DELAYED_RELEASE_TABLET | Freq: Two times a day (BID) | ORAL | Status: DC
  Administered 2021-12-01 – 2021-12-04 (×7): 500 mg via ORAL
  Filled 2021-12-01 (×7): qty 2

## 2021-12-01 MED ORDER — PALIPERIDONE ER 6 MG PO TB24
6.0000 mg | ORAL_TABLET | Freq: Every day | ORAL | Status: DC
  Administered 2021-12-01 – 2021-12-07 (×7): 6 mg via ORAL
  Filled 2021-12-01 (×7): qty 1

## 2021-12-01 MED ORDER — OLANZAPINE 5 MG PO TBDP
10.0000 mg | ORAL_TABLET | Freq: Three times a day (TID) | ORAL | Status: DC | PRN
  Administered 2021-12-02 – 2021-12-07 (×5): 10 mg via ORAL
  Filled 2021-12-01 (×5): qty 2

## 2021-12-01 MED ORDER — STERILE WATER FOR INJECTION IJ SOLN
INTRAMUSCULAR | Status: AC
  Filled 2021-12-01: qty 10

## 2021-12-01 MED ORDER — ZIPRASIDONE MESYLATE 20 MG IM SOLR
INTRAMUSCULAR | Status: AC
  Filled 2021-12-01: qty 20

## 2021-12-01 MED ORDER — ZIPRASIDONE MESYLATE 20 MG IM SOLR
20.0000 mg | INTRAMUSCULAR | Status: AC | PRN
  Administered 2021-12-01: 20 mg via INTRAMUSCULAR
  Filled 2021-12-01: qty 20

## 2021-12-01 MED ORDER — LORAZEPAM 1 MG PO TABS
1.0000 mg | ORAL_TABLET | ORAL | Status: AC | PRN
  Administered 2021-12-03: 1 mg via ORAL
  Filled 2021-12-01: qty 1

## 2021-12-01 MED ORDER — ATORVASTATIN CALCIUM 40 MG PO TABS
40.0000 mg | ORAL_TABLET | Freq: Every day | ORAL | Status: DC
Start: 2021-12-01 — End: 2021-12-07
  Administered 2021-12-01 – 2021-12-06 (×6): 40 mg via ORAL
  Filled 2021-12-01 (×6): qty 1

## 2021-12-01 NOTE — Progress Notes (Signed)
Per Maxie Barb, NP, patient meets criteria for inpatient treatment. There are no available beds at [facility] today. CSW faxed referrals to the following facilities for review:  [facility]  Pending - Request Sent N/A [address] [place] [address] [phone] --  [facility]  Pending - Request Sent N/A [address] [place] [address] [phone] --  [facility]  Pending - Request Sent N/A [address] [place], [place] [address] [phone] --  [facility]  Pending - Request Sent N/A [address] [place] [address] [phone] --  [facility]  Pending - Request Sent N/A [address], [place] [address] [phone] --  [facility]  Pending - Request Sent N/A [address] [place] [address] [phone] --  [facility]  Pending - Request Sent N/A [address] [place] [address] [phone] --  [facility]  Pending - Request Sent N/A [address] [place] [address] [phone] --  [facility]  Pending - Request Sent N/A [address] [place] [address] [phone] --  [facility]  Pending - Request Sent N/A [address] [place] [address] [phone] --  [facility]  Pending - Request Sent N/A [address] [place] [address] [phone] --  [facility]  Pending - Request Sent N/A [address] [place], [place] [address] [phone] --  [facility]  Pending - Request Sent N/A [address] [place] [address] [phone] --  [facility]   Pending - No Request Sent N/A [address] [place] [address] [phone] --  [facility]  Pending - No Request Sent N/A [address] [place] [address] [phone] --  [facility]  Pending - No Request Sent N/A [address] [place] [address] [phone] --  [facility]  Pending - No Request Sent N/A [address] [place], [place] [address] [phone] --  [facility]  Pending - No Request Sent N/A [address] [place] [address] [phone] --   TTS will continue to seek bed placement.  Crissie Reese, MSW, Lenice Pressman Phone: [phone] Disposition/TOC
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
## 2021-12-01 NOTE — Consult Note (Signed)
Telepsych Consultation   Reason for Consult:  psychosis Referring Physician:  Teressa Lower, MD [facility] of [facility]:  [facility] Location of Provider: [place] Department  Patient Identification: [name] MRN:  [id number] Principal Diagnosis: Psychosis [facility]) Diagnosis:  Principal Problem:   Psychosis ([place]) Active Problems:   Schizoaffective disorder, bipolar type ([place])   Total Time spent with patient: 30 minutes  Subjective:   [name] is a 46 y.o. male patient admitted with psychosis.  Patient presents alert and oriented person, place, partial to time (Saturday August 3rd or 4th, 2027), situation: "I barricaded myself in my apartment because the police were trying to come in my apartment". He is tangential, circumstantial, and scattered with elevated mood. Delusional thought content noted. He is requesting to change his address to the hospital or state hospital and stating his blood can do magic tricks.He denies any active thoughts of wanting to harm himself or anyone, auditory or visual hallucinations.   HPI:  [name] is a 46 year old male patient with past psychiatric history of schizoaffective disorder bipolar type, schizophrenia, overdose who presented to [place] 11/29/21 via IVC for psychosis. Per chart review, x6 ED encounters since 11/22/21, 9 since 10/28/21. No active community supports; TOC/SW consult placed for [facility] referral. UDS-, BAL<10. PDMP reviewed, no recent medications.   Past Psychiatric History: schizoaffective disorder bipolar type, schizophrenia  Risk to Self:   Risk to Others:   Prior Inpatient Therapy:   Prior Outpatient Therapy:    Past Medical History:  Past Medical History:  Diagnosis Date   Bipolar disorder ([place])    Bronchitis    Hyperlipidemia    Paranoid schizophrenia ([place])    Schizophrenia ([place])     Past Surgical History:  Procedure Laterality Date   TESTICLE IMPLANTATION TO THIGH     TESTICLE SURGERY      Family History:  Family History  Problem Relation Age of Onset   Schizophrenia Neg Hx    Family Psychiatric  History: not noted Social History:  Social History   Substance and Sexual Activity  Alcohol Use Not Currently   Comment: weekly     Social History   Substance and Sexual Activity  Drug Use Yes   Types: Marijuana   Comment: smokes hemp    Social History   Socioeconomic History   Marital status: Single    Spouse name: Not on file   Number of children: Not on file   Years of education: 12 years   Highest education level: 12th grade  Occupational History   Occupation: On disability  Tobacco Use   Smoking status: Every Day    Packs/day: 1.00    Types: Cigarettes   Smokeless tobacco: Never  Vaping Use   Vaping Use: Never used  Substance and Sexual Activity   Alcohol use: Not Currently    Comment: weekly   Drug use: Yes    Types: Marijuana    Comment: smokes hemp   Sexual activity: Yes    Birth control/protection: Condom  Other Topics Concern   Not on file  Social History Narrative   Pt lives alone in apartment complex for mentally ill   Social Determinants of Health   Financial Resource Strain: Not on file  Food Insecurity: Not on file  Transportation Needs: Not on file  Physical Activity: Not on file  Stress: Not on file  Social Connections: Not on file   Additional Social History:    Allergies:   Allergies  Allergen Reactions   Other     Some trial medicine from [facility].     Labs: No results found for this or any previous visit (from the past 48 hour(s)).  Medications:  Current Facility-Administered Medications  Medication Dose Route Frequency Provider Last Rate Last Admin   divalproex (DEPAKOTE) DR tablet 500 mg  500 mg Oral Q12H Leevy-Johnson, Zohar Maroney A, NP   500 mg at 12/01/21 1016   ibuprofen (ADVIL) tablet 800 mg  800 mg Oral Q6H PRN Eber Hong, MD   800 mg at 12/01/21 0319   LORazepam (ATIVAN) tablet 1 mg  1 mg Oral Once Sabas Sous, MD       OLANZapine zydis [facility]) disintegrating tablet 10 mg  10 mg Oral Q8H PRN Leevy-Johnson, Alizandra Loh A, NP       And   LORazepam (ATIVAN) tablet 1 mg  1 mg Oral PRN Leevy-Johnson, Darris Staiger A, NP       And   ziprasidone (GEODON) injection 20 mg  20 mg Intramuscular PRN Leevy-Johnson, Infantof Villagomez A, NP       nicotine (NICODERM CQ - dosed in mg/24 hours) patch 21 mg  21 mg Transdermal Daily Eber Hong, MD   21 mg at 12/01/21 3790   paliperidone (INVEGA) 24 hr tablet 6 mg  6 mg Oral Daily Leevy-Johnson, Winnona Wargo A, NP   6 mg at 12/01/21 1016   sterile water (preservative free) injection            Current Outpatient Medications  Medication Sig Dispense Refill   ABILIFY MAINTENA 400 MG PRSY prefilled syringe Inject 400 mg into the muscle every 28 (twenty-eight) days.     atorvastatin (LIPITOR) 40 MG tablet Take 40 mg by mouth at bedtime.     ibuprofen (ADVIL) 800 MG tablet Take 800 mg by mouth 2 (two) times daily as needed for fever, mild pain or headache.     metFORMIN (GLUCOPHAGE) 500 MG tablet Take 2 tablets (1,000 mg total) by mouth 2 (two) times daily with a meal. 60 tablet 0   naproxen (NAPROSYN) 500 MG tablet Take 1 tablet (500 mg total) by mouth 2 (two) times daily. 10 tablet 0    Musculoskeletal: Strength & Muscle Tone: within normal limits Gait & Station: normal Patient leans: N/A  Psychiatric Specialty Exam:  Presentation  General Appearance: Casual; Bizarre  Eye Contact:Fair  Speech:Pressured; Clear and Coherent  Speech Volume:Normal  Handedness:Right   Mood and Affect  Mood:-- (elevated)  Affect:Congruent   Thought Process  Thought Processes:Goal Directed  Descriptions of Associations:Tangential  Orientation:Partial  Thought Content:Illogical; Perseveration; Scattered; Tangential  History of Schizophrenia/Schizoaffective disorder:Yes  Duration of Psychotic Symptoms:Greater than six months  Hallucinations:Hallucinations: None  Ideas of  Reference:Delusions  Suicidal Thoughts:Suicidal Thoughts: No  Homicidal Thoughts:Homicidal Thoughts: No   Sensorium  Memory:Immediate Fair; Recent Fair; Remote Fair  Judgment:Impaired  Insight:Poor   Executive Functions  Concentration:Fair  Attention Span:Fair  Recall:Fair  Fund of Knowledge:Fair  Language:Fair   Psychomotor Activity  Psychomotor Activity:Psychomotor Activity: Normal   Assets  Assets:Communication Skills; Desire for Improvement; Housing; Resilience; Physical Health   Sleep  Sleep:Sleep: Fair    Physical Exam: Physical Exam Vitals and nursing note reviewed.    Review of Systems  Psychiatric/Behavioral:         Delusional. Elevated affect  All other systems reviewed and are negative.  Blood pressure 101/65, pulse 84, temperature 98.1 F (36.7 C), temperature source Oral, resp. rate 16, height 6\' 1"  (1.854 m), weight 119.2  kg, SpO2 100 %. Body mass index is 34.66 kg/m.  Treatment Plan Summary: Daily contact with patient to assess and evaluate symptoms and progress in treatment, Medication management, and Plan Start: Paliperidone 6 mg daily, Depakote 500 mg BID, Agitation Orders in. Seek inpatient hospitalization for further observation, stabilization, and treatment.   Disposition: Recommend psychiatric Inpatient admission when medically cleared. Supportive therapy provided about ongoing stressors. Discussed crisis plan, support from social network, calling 911, coming to the Emergency Department, and calling Suicide Hotline.  This service was provided via telemedicine using a 2-way, interactive audio and video technology.  Names of all persons participating in this telemedicine service and their role in this encounter. Name: Maxie Barb Role: PMHNP  Name: Nelly Rout Role: Attending MD  Name: Sheryn Bison Role: patient  Name:  Role: parent    Loletta Parish, NP 12/01/2021 10:27 AM

## 2021-12-01 NOTE — ED Notes (Signed)
Pt. Revealing his penis starring at sitter and patting it, when other sitter passed by he proceeded to pull it out again while touching himself. Sitters both told pt to put his penis away to which he said he did not see anything and that "it fell out" all while being 'excited'

## 2021-12-01 NOTE — ED Notes (Signed)
This sitter was walking past this pt bed to get a dinamap and looked over at pt who had his penis exposed and erect while touching himself and smiling at sitter. This sitter immediately told pt to put his penis away because there are other patients in the hallways and it is not the time nor place to be engaging in these behaviors. Pt told this sitter and another that he did not know what sitters were talking about and that it (his penis) had just "fallen out" of his pants.

## 2021-12-01 NOTE — ED Notes (Signed)
Pt. Was sleeping on the very edge of the bed I approached pt. Asked him to move over in the bed so that he wouldn't fall out pt. Refused I attempted to raise the bed rail pt. Screamed don't touch me I stepped back pt. Then jumped up off the bed hollering what's your name trying to grab my badge every time I stepped back and to the side pt. Would advance staff then intervened pt. Then tried to leave security notified.

## 2021-12-01 NOTE — ED Notes (Signed)
Pt spit out Ativan tablet and crushed between his fingers, ambulated to BR and attempted to close door in sitter's face and flush armband down the toilet

## 2021-12-01 NOTE — ED Notes (Signed)
Pt care taken, no complaints at this time. 

## 2021-12-01 NOTE — ED Notes (Signed)
Vitals deferred d/t patient sleeping

## 2021-12-02 DIAGNOSIS — F250 Schizoaffective disorder, bipolar type: Secondary | ICD-10-CM | POA: Diagnosis not present

## 2021-12-02 MED ORDER — LORAZEPAM 2 MG/ML IJ SOLN
4.0000 mg | Freq: Once | INTRAMUSCULAR | Status: AC
  Administered 2021-12-02: 4 mg via INTRAMUSCULAR
  Filled 2021-12-02: qty 2

## 2021-12-02 NOTE — ED Notes (Signed)
Pt in bathroom for extended period of time and noted to have had a BM which he then proceeded to play in with his hands and smear all over bathroom and himself. Pt then given a shower and a clean pair of scrubs.

## 2021-12-02 NOTE — ED Notes (Addendum)
Pt ate breakfast, used bathroom and has been pleasant and cooperative with staff so far this morning. No apparent distress or behavioral issues noted. Took shower independently and without incident.

## 2021-12-02 NOTE — ED Notes (Signed)
Pt made first phone call for the day, stated he plans to have cousin Cobey visit during visitation hours later today.

## 2021-12-02 NOTE — ED Notes (Signed)
Pt attempted to follow transport team out the door. Pt was able to be re-directed at door by security. Dr Pilar Plate notified.

## 2021-12-02 NOTE — Consult Note (Signed)
Telepsych Consultation   Reason for Consult:  psychosis Referring Physician:  Glendora Score, MD Location of Patient:  [facility] [phone] Location of Provider: Behavioral Health TTS Department  Patient Identification: [name] MRN:  [id number] Principal Diagnosis: Psychosis [facility]) Diagnosis:  Principal Problem:   Psychosis (HCC) Active Problems:   Schizoaffective disorder, bipolar type (HCC)   Total Time spent with patient: 20 minutes  Subjective:   [name] is a 46 y.o. male patient admitted with psychosis.  Patient presents alert and oriented to person, place, time, and situation. Calm and  cooperative. Begins talking about celebrities and being mistaken for one since being in the hospital. Says doctors have made a lot of medical discoveries since 1998 with him says his fillings in his teeth have come in since yesterday and are growing. Denies any auditory or visual hallucinations, suicidal or homicidal ideations; endorses having thoughts of being a super hero and my need to be transported to the airport or flight simulator. He remains delusional, tangential, and illogical; mood is calm in comparison to day prior.   HPI:  [name] is a 46 year old male patient with past psychiatric history of schizoaffective disorder bipolar type, schizophrenia, overdose who [facility] via IVC for psychosis. Per chart review, x6 ED encounters since 11/22/21, 9 since 10/28/21. No active community supports; TOC/SW consult placed for [facility] referral. UDS-, BAL<10. PDMP reviewed, no recent medications.  Past Psychiatric History: schizoaffective disorder bipolar type, schizophrenia  Risk to Self:   Risk to Others:   Prior Inpatient Therapy:   Prior Outpatient Therapy:    Past Medical History:  Past Medical History:  Diagnosis Date   Bipolar disorder (HCC)    Bronchitis    Hyperlipidemia    Paranoid schizophrenia (HCC)    Schizophrenia (HCC)     Past Surgical History:   Procedure Laterality Date   TESTICLE IMPLANTATION TO THIGH     TESTICLE SURGERY     Family History:  Family History  Problem Relation Age of Onset   Schizophrenia Neg Hx    Family Psychiatric  History: not noted Social History:  Social History   Substance and Sexual Activity  Alcohol Use Not Currently   Comment: weekly     Social History   Substance and Sexual Activity  Drug Use Yes   Types: Marijuana   Comment: smokes hemp    Social History   Socioeconomic History   Marital status: Single    Spouse name: Not on file   Number of children: Not on file   Years of education: 12 years   Highest education level: 12th grade  Occupational History   Occupation: On disability  Tobacco Use   Smoking status: Every Day    Packs/day: 1.00    Types: Cigarettes   Smokeless tobacco: Never  Vaping Use   Vaping Use: Never used  Substance and Sexual Activity   Alcohol use: Not Currently    Comment: weekly   Drug use: Yes    Types: Marijuana    Comment: smokes hemp   Sexual activity: Yes    Birth control/protection: Condom  Other Topics Concern   Not on file  Social History Narrative   Pt lives alone in apartment complex for mentally ill   Social Determinants of Health   Financial Resource Strain: Not on file  Food Insecurity: Not on file  Transportation Needs: Not on file  Physical Activity: Not on file  Stress: Not on file  Social  Connections: Not on file   Additional Social History:    Allergies:   Allergies  Allergen Reactions   Other     Some trial medicine from [facility].     Labs:  Results for orders placed or performed during the hospital encounter of 11/29/21 (from the past 48 hour(s))  Lipid panel     Status: Abnormal   Collection Time: 12/01/21 10:21 AM  Result Value Ref Range   Cholesterol 130 0 - 200 mg/dL   Triglycerides 172 (H) <150 mg/dL   HDL 28 (L) >40 mg/dL   Total CHOL/HDL Ratio 4.6 RATIO   VLDL 34 0 - 40 mg/dL   LDL Cholesterol 68 0  - 99 mg/dL    Comment:        Total Cholesterol/HDL:CHD Risk Coronary Heart Disease Risk Table                     Men   Women  1/2 Average Risk   3.4   3.3  Average Risk       5.0   4.4  2 X Average Risk   9.6   7.1  3 X Average Risk  23.4   11.0        Use the calculated Patient Ratio above and the CHD Risk Table to determine the patient's CHD Risk.        ATP III CLASSIFICATION (LDL):  <100     mg/dL   Optimal  100-129  mg/dL   Near or Above                    Optimal  130-159  mg/dL   Borderline  160-189  mg/dL   High  >190     mg/dL   Very High Performed at [facility], [address], [place], [place] [address]     Medications:  Current Facility-Administered Medications  Medication Dose Route Frequency Provider Last Rate Last Admin   atorvastatin (LIPITOR) tablet 40 mg  40 mg Oral QHS Milton Ferguson, MD   40 mg at 12/01/21 2108   benztropine (COGENTIN) tablet 0.5 mg  0.5 mg Oral BID PRN Leevy-Johnson, Marquel Pottenger A, NP       Or   benztropine mesylate (COGENTIN) injection 0.5 mg  0.5 mg Intramuscular BID PRN Leevy-Johnson, Adnan Vanvoorhis A, NP       divalproex (DEPAKOTE) DR tablet 500 mg  500 mg Oral Q12H Leevy-Johnson, Henslee Lottman A, NP   500 mg at 12/02/21 0914   ibuprofen (ADVIL) tablet 800 mg  800 mg Oral Q6H PRN Noemi Chapel, MD   800 mg at 12/01/21 1350   LORazepam (ATIVAN) tablet 1 mg  1 mg Oral Once Maudie Flakes, MD       OLANZapine zydis [facility]) disintegrating tablet 10 mg  10 mg Oral Q8H PRN Leevy-Johnson, Nester Bachus A, NP       And   LORazepam (ATIVAN) tablet 1 mg  1 mg Oral PRN Leevy-Johnson, Danilynn Jemison A, NP       nicotine (NICODERM CQ - dosed in mg/24 hours) patch 21 mg  21 mg Transdermal Daily Noemi Chapel, MD   21 mg at 12/02/21 0915   paliperidone (INVEGA) 24 hr tablet 6 mg  6 mg Oral Daily Leevy-Johnson, Refujio Haymer A, NP   6 mg at 12/02/21 [id number]   Current Outpatient Medications  Medication Sig Dispense Refill   ABILIFY MAINTENA 400 MG PRSY prefilled syringe Inject 400 mg  into the muscle every 28 (  twenty-eight) days.     atorvastatin (LIPITOR) 40 MG tablet Take 40 mg by mouth at bedtime.     ibuprofen (ADVIL) 800 MG tablet Take 800 mg by mouth 2 (two) times daily as needed for fever, mild pain or headache.     metFORMIN (GLUCOPHAGE) 500 MG tablet Take 2 tablets (1,000 mg total) by mouth 2 (two) times daily with a meal. 60 tablet 0   naproxen (NAPROSYN) 500 MG tablet Take 1 tablet (500 mg total) by mouth 2 (two) times daily. 10 tablet 0    Musculoskeletal: Strength & Muscle Tone: within normal limits Gait & Station: normal Patient leans: N/A  Psychiatric Specialty Exam:  Presentation  General Appearance: Casual; Bizarre  Eye Contact:Fair  Speech:Pressured; Clear and Coherent  Speech Volume:Normal  Handedness:Right  Mood and Affect  Mood:-- (elevated)  Affect:Congruent  Thought Process  Thought Processes:Goal Directed  Descriptions of Associations:Tangential  Orientation:Partial  Thought Content:Illogical; Perseveration; Scattered; Tangential  History of Schizophrenia/Schizoaffective disorder:Yes  Duration of Psychotic Symptoms:Greater than six months  Hallucinations:Hallucinations: None  Ideas of Reference:Delusions  Suicidal Thoughts:Suicidal Thoughts: No  Homicidal Thoughts:Homicidal Thoughts: No  Sensorium  Memory:Immediate Fair; Recent Fair; Remote Fair  Judgment:Impaired  Insight:Poor  Executive Functions  Concentration:Fair  Attention Span:Fair  Recall:Fair  Fund of Knowledge:Fair  Language:Fair  Psychomotor Activity  Psychomotor Activity:Psychomotor Activity: Normal  Assets  Assets:Communication Skills; Desire for Improvement; Housing; Resilience; Physical Health  Sleep  Sleep:Sleep: Fair  Physical Exam: Physical Exam Vitals and nursing note reviewed.    Review of Systems  Psychiatric/Behavioral:         Delusional  All other systems reviewed and are negative.  Blood pressure 126/86, pulse  91, temperature 98.3 F (36.8 C), temperature source Oral, resp. rate 18, height 6\' 1"  (1.854 m), weight 119.2 kg, SpO2 99 %. Body mass index is 34.66 kg/m.  Treatment Plan Summary: Daily contact with patient to assess and evaluate symptoms and progress in treatment, Medication management, and Plan Depakote level in am, seek inpatient hospitalization  Disposition: Recommend psychiatric Inpatient admission when medically cleared. Supportive therapy provided about ongoing stressors. Discussed crisis plan, support from social network, calling 911, coming to the Emergency Department, and calling Suicide Hotline.  This service was provided via telemedicine using a 2-way, interactive audio and video technology.  Names of all persons participating in this telemedicine service and their role in this encounter. Name: Role: PMHNP  Name: Maxie Barb Role: Attending MD  Name: [name] Role: patient  Name:  Role:     Nichola Sizer, NP 12/02/2021 12:36 PM

## 2021-12-02 NOTE — ED Notes (Signed)
Pt said going to bathroom, sitter did not see pt come out. Pt tried to exit the building as [facility] was leaving. Security escorted pt back  in the building and to the  bed

## 2021-12-02 NOTE — ED Notes (Signed)
Pt being TTS at this time  

## 2021-12-03 ENCOUNTER — Encounter (HOSPITAL_COMMUNITY): Payer: Self-pay | Admitting: Emergency Medicine

## 2021-12-03 DIAGNOSIS — F250 Schizoaffective disorder, bipolar type: Secondary | ICD-10-CM | POA: Diagnosis not present

## 2021-12-03 LAB — VALPROIC ACID LEVEL: Valproic Acid Lvl: 38 ug/mL — ABNORMAL LOW (ref 50.0–100.0)

## 2021-12-03 MED ORDER — POLYETHYLENE GLYCOL 3350 17 G PO PACK
17.0000 g | PACK | Freq: Every day | ORAL | Status: DC | PRN
  Administered 2021-12-03: 17 g via ORAL
  Filled 2021-12-03: qty 1

## 2021-12-03 NOTE — ED Notes (Signed)
Pt pacing the floors taking about heaven and hell and how he is committed in his marriage.

## 2021-12-03 NOTE — ED Notes (Signed)
Pt became agitated yelling and having some body language aggression. Pt shutting door and not wanting it open.  Security called and staff redirected pt. Pt began to yell stating " I am God", " I will have the door shut". Pt redirected .

## 2021-12-03 NOTE — ED Notes (Signed)
Pt coming out of room slamming door and cursing security was called

## 2021-12-03 NOTE — ED Notes (Signed)
Pt had a visitor 

## 2021-12-03 NOTE — Progress Notes (Signed)
Inpatient Behavioral Health Placement  Pt meets inpatient criteria per ,Loletta Parish, NP. There are no [facility] beds at [facility], RN. Referral was sent to the following facilities;    Destination Service Provider Address Phone Fax  [facility]  [address] [place] [address] [phone]  [facility]  [address] [place] [address] [phone]  [facility] Center-Ad[facility]  [address] [place], [place] [address] [phone]  [facility]  [address] [place] [address] [phone]  [facility]  [address] [place] [address] [phone]  [facility]  [address] [place] [address] [phone]  [facility]  [address] [place] [address] [phone]  [facility]  [address] [place] [address] [phone]  [facility]  [address] [place] [address] [phone]  [facility]  [address], [place] [address] [phone]  [facility]  [address] [place] [address] [phone]  [facility]  [address] [place], [place] [address] [phone]  [facility]  [address] [place] [address] [phone]  [facility]  [address] [place] [address] [phone]  [facility]  [address] [place] [address] [phone]  [facility]  [address] [place] [address] [phone]  [facility]  [address] [place], [place]  [place] [address] [phone]  [facility]  [address] [place] [address] [phone]   Situation ongoing,  CSW will follow up.   Maryjean Ka, MSW, LCSWA 12/03/2021  @ 2:43 PM

## 2021-12-03 NOTE — ED Provider Notes (Signed)
Emergency Medicine Observation Re-evaluation Note  ARNIE CLINGENPEEL is a 46 y.o. male, seen on rounds today.  Pt initially presented to the ED for complaints of Psychiatric Evaluation Currently, the patient is calm, washing himself with hand sanitizer.  Physical Exam  BP 118/65 (BP Location: Right Arm)   Pulse (!) 105   Temp 98.4 F (36.9 C) (Oral)   Resp (!) 22   Ht 6\' 1"  (1.854 m)   Wt 119.2 kg   SpO2 100%   BMI 34.66 kg/m  Physical Exam General: nad Lungs: ambient air, no resp distress Psych: calm  ED Course / MDM  EKG:   I have reviewed the labs performed to date as well as medications administered while in observation.  Recent changes in the last 24 hours include miralax ordered for prn constipation, no acute events.   Plan  Current plan is for placement . TRAVERS GOODLEY is under involuntary commitment.      Nichola Sizer A, DO 12/03/21 1426

## 2021-12-03 NOTE — ED Notes (Signed)
TOC consulted for [facility] referral to be completed. CSW noted per chart review that pt is recommended for inpatient psychiatric treatment at this time. CSW spoke with [facility] supervisor Lafonda Mosses who requested that CSW reach out to psych disposition social worker as this referral falls under the psych disposition social workers scope of practice. CSW sent message via secure chat to Damita Dunnings, LCSW to inform of need for [facility] referral. TOC signing off at this time.

## 2021-12-03 NOTE — ED Notes (Signed)
Pt c/o constipation. EDP aware

## 2021-12-04 ENCOUNTER — Telehealth: Payer: Self-pay

## 2021-12-04 DIAGNOSIS — F250 Schizoaffective disorder, bipolar type: Secondary | ICD-10-CM | POA: Diagnosis not present

## 2021-12-04 MED ORDER — ZIPRASIDONE MESYLATE 20 MG IM SOLR
20.0000 mg | Freq: Once | INTRAMUSCULAR | Status: AC
  Administered 2021-12-04: 20 mg via INTRAMUSCULAR
  Filled 2021-12-04: qty 20

## 2021-12-04 MED ORDER — DIVALPROEX SODIUM 250 MG PO DR TAB
750.0000 mg | DELAYED_RELEASE_TABLET | Freq: Two times a day (BID) | ORAL | Status: DC
  Administered 2021-12-04 – 2021-12-07 (×6): 750 mg via ORAL
  Filled 2021-12-04 (×6): qty 3

## 2021-12-04 MED ORDER — ACETAMINOPHEN 325 MG PO TABS
650.0000 mg | ORAL_TABLET | Freq: Four times a day (QID) | ORAL | Status: DC | PRN
  Administered 2021-12-04 – 2021-12-06 (×2): 650 mg via ORAL
  Filled 2021-12-04 (×2): qty 2

## 2021-12-04 NOTE — ED Notes (Signed)
Pts mother came to visit pt. Visit was calm and appropriate. Mother told this RN she gave pt $77. RN explained pt is not allowed to have money while in IVC placement in the hospital. Mother and pt were understanding and pt gave money back with no hesitation. Mother left. Patient is calm.

## 2021-12-04 NOTE — Progress Notes (Signed)
Inpatient Behavioral Health Placement  Pt meets inpatient criteria per Cecilie Lowers, FNP.  Referral was sent to the following facilities;   Destination Service Provider Address Phone Fax  [facility]  [address] [place] [address] [phone]  [facility]  [address] [place] [address] [phone]  [facility]  [address] [place], [place] [address] [phone]   [facility]  [address] [place] [address] [phone]  [facility]  [address] [place] [address] [phone]  [facility]  [address], [place] [address] [phone]  [facility]  [address] [place] [address] [phone]  [facility]  [address] [place] [address] [phone]  [facility]  [address] [place] [address] [phone]  [facility]  [address], [place] [address] [phone]  [facility]  [address] [place] [address] [phone]  [facility]  [address] [place], [place] [address] [phone]  [facility]  [address] [place] [address] [phone]  [facility]  [address] [place] [address] [phone]  [facility]  [address] [place] [address] [phone]  [facility]  [address], [place] [address] [phone]  [facility]  [address] [place], [place] [address] [phone]  [facility]  [address] [place] [address] [phone]    Situation ongoing,  CSW will follow up.   Maryjean Ka, MSW, [facility] 12/04/2021  @ 11:39 PM

## 2021-12-04 NOTE — Telephone Encounter (Signed)
        Patient  visited [facility] on 7/31    Telephone encounter attempt :  3rd outreach  A HIPAA compliant voice message was left requesting a return call.  Instructed patient to call back     [facility] Guide, Embedded Care Coordination [facility], Care Management  [phone] [address] [place], [place], [place] [address] Phone: [phone] Email: [email or web address]

## 2021-12-04 NOTE — ED Notes (Signed)
Pt getting agitated, started punching air, sitter asked if he was okay and Pt. Called sitter fat and chubby and chubby face. Pt. talking crazy about orange juice and poison.

## 2021-12-04 NOTE — ED Notes (Signed)
Pt received breakfast tray 

## 2021-12-04 NOTE — ED Notes (Signed)
Pt took urinal full of lotions urine and paper and poured it on to linen stating  "it was soiled linen and he should be able to go to the bathroom and not use a fucking urinal"

## 2021-12-04 NOTE — ED Notes (Signed)
Pt refused clean linen for his bed

## 2021-12-04 NOTE — ED Notes (Signed)
Pt has soiled on his scrubs, pt provided clean pair of burgundy scrubs. Pt states "I dont want your clean clothes, I'll change when yall give me my own clothes."

## 2021-12-04 NOTE — ED Notes (Signed)
Pt received dinner tray.

## 2021-12-04 NOTE — ED Notes (Signed)
Pt received lunch tray 

## 2021-12-04 NOTE — ED Provider Notes (Signed)
Emergency Medicine Observation Re-evaluation Note  BRIGHAM COBBINS is a 46 y.o. male, seen on rounds today.  Pt initially presented to the ED for complaints of Psychiatric Evaluation Currently, the patient is awake, standing in doorway.  Physical Exam  BP (!) 131/92   Pulse (!) 101   Temp 98.3 F (36.8 C)   Resp 19   Ht 6\' 1"  (1.854 m)   Wt 119.2 kg   SpO2 98%   BMI 34.66 kg/m  Physical Exam General: Awake, alert, nondistressed Cardiac: Extremities are well-perfused Lungs: Breathing is unlabored Psych: Calm, cooperative, no agitation  ED Course / MDM  EKG:   I have reviewed the labs performed to date as well as medications administered while in observation.  Recent changes in the last 24 hours include agitation overnight.  Plan  Current plan is for inpatient behavioral health placement. JAYLEEN AFONSO is under involuntary commitment.      Nichola Sizer, MD 12/04/21 [phone]

## 2021-12-04 NOTE — ED Notes (Signed)
Pt pacing the halls, cussing at staff. Dr. Judd Lien made aware, orders received
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
## 2021-12-05 MED ORDER — METFORMIN HCL 500 MG PO TABS
1000.0000 mg | ORAL_TABLET | Freq: Two times a day (BID) | ORAL | Status: DC
Start: 2021-12-05 — End: 2021-12-07
  Administered 2021-12-05 – 2021-12-07 (×5): 1000 mg via ORAL
  Filled 2021-12-05 (×5): qty 2

## 2021-12-05 NOTE — Consult Note (Signed)
  Attempted to reach patient several time via [employer],however, no one is picking up the call.  Alan Mulder, NP Psychiatry
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
## 2021-12-05 NOTE — ED Provider Notes (Signed)
Emergency Medicine Observation Re-evaluation Note  [name] is a 46 y.o. male, seen on rounds today.  Pt initially presented to the ED for complaints of Psychiatric Evaluation Currently, the patient is resting  Physical Exam  BP (!) 157/97 (BP Location: Right Arm)   Pulse (!) 108   Temp 98.1 F (36.7 C) (Oral)   Resp 18   Ht 6\' 1"  (1.854 m)   Wt 119.2 kg   SpO2 98%   BMI 34.66 kg/m  Physical Exam General: NAD  Lungs: No resp distress  ED Course / MDM  EKG:   I have reviewed the labs performed to date as well as medications administered while in observation.   Plan  Current plan is for inpatient psych hospitalization [name] is under involuntary commitment.      Nichola Sizer, MD 12/05/21 [phone]

## 2021-12-06 LAB — VALPROIC ACID LEVEL: Valproic Acid Lvl: 65 ug/mL (ref 50.0–100.0)

## 2021-12-06 MED ORDER — LORAZEPAM 1 MG PO TABS
1.0000 mg | ORAL_TABLET | Freq: Once | ORAL | Status: AC
Start: 2021-12-06 — End: 2021-12-06
  Administered 2021-12-06: 1 mg via ORAL
  Filled 2021-12-06: qty 1

## 2021-12-06 NOTE — ED Notes (Signed)
Pt is pacing the room. Pt lights/tv on and fresh water at bedside.

## 2021-12-06 NOTE — ED Notes (Signed)
Pt continues to step out in the hall after repeatedly being told to stay in rm.

## 2021-12-06 NOTE — Consult Note (Signed)
Telepsych Consultation   Reason for Consult: Psychosis Referring Physician: Dr. Langston Masker Location of Patient: [facility] Location of Provider: [facility]  Patient Identification: [name] MRN:  [id number] Principal Diagnosis: Psychosis [facility]) Diagnosis:  Principal Problem:   Psychosis ([place]) Active Problems:   Schizoaffective disorder, bipolar type ([place])   Total Time spent with patient: 30 minutes  Subjective:   [name] is a 46 y.o. male patient.  HPI:  As per initial [facility] HPI:  Patient here with law enforcement, who states they were at a home where the patient was staying with his mother for the last 3 days.  His mother called law enforcement because the patient was "talking out of his head."  The patient was reported to be talking about movies, implants in his body and at 1 point locked himself into a room and would not talk to law enforcement.  Law enforcement felt he was at risk so they restrain him with cuffs and brought him here.  There were no IVC papers, and no one else is here with the patient.  On prior visits to the ED, multiple times, the patient has been stable with chronic psychiatric illness and reported to be homeless.  The patient states that he has not seen his psychiatrist recently.  He is unable to specify if he is taking any medications.    Assessment: On assessment today via telepsych, patient is seen and examined sitting up on his bed.  Appears calm and cooperating with the exams.  Chart reviewed and findings shared with the treatment team and consult with Dr. Dwyane Dee.  Patient presents alert and oriented to name, place, time and partially to situation.  When asked what brought patient to the hospital, reports that he had some problems with his neighbors, while he was smoking, they reported him to the complex manager.  Continues to present with disorganized thoughts, paranoia, and delusional stating, he still sees the heavenly beings visiting at the  people on earth. Reports that he saw these being in 1998 and continues to see them visiting with him when he reads the bible. Calmer with speech less pressured.  Mood euthymic with affect appropriate and congruent.  Memory is fair, judgment impaired and insight shallow.  Patient denies suicidal ideation, homicidal ideation, or auditory/visual hallucinations. Reports sleeping over 7 hours and feeling restful. Reports appetite is very good and normal and drinking adequate fluids. Reports being safe at home although patient was evicted from his apartment and staying with mom over the past 3 days prior to admission. Denies self-injurious behavior or attempting suicide. Denies drug use.  Endorses occasional drinking of beer and smoking 1 to 2 packs of cigarette daily.  Instructions provided on cessation of polysubstance use due to adverse consequences on the body system. Reports being followed by a therapist and psychiatrist from [facility].  Denies family history of mental illness Patient is not psych cleared, and continues to need inpatient psychiatric placement at this time. Disposition:   [facility] emergency room treatment team and Forestine Na, ED physician made aware of patient disposition.   Past Psychiatric History: Psychosis, schizoaffective disorder bipolar type, overdose, intentional self-harm, paranoid schizophrenia, bipolar disorder  Risk to Self: yes  Risk to Others:  yes Prior Inpatient Therapy:  yes Prior Outpatient Therapy:   Unable to determine    Past Medical History:  Past Medical History:  Diagnosis Date   Bipolar disorder ([place])    Bronchitis    Hyperlipidemia    Paranoid  schizophrenia ([facility])    Schizophrenia ([facility])     Past Surgical History:  Procedure Laterality Date   TESTICLE IMPLANTATION TO THIGH     TESTICLE SURGERY     Family History:  Family History  Problem Relation Age of Onset   Schizophrenia Neg Hx    Family Psychiatric  History: None indicated  Social  History:  Social History   Substance and Sexual Activity  Alcohol Use Not Currently   Comment: weekly     Social History   Substance and Sexual Activity  Drug Use Yes   Types: Marijuana   Comment: smokes hemp    Social History   Socioeconomic History   Marital status: Single    Spouse name: Not on file   Number of children: Not on file   Years of education: 12 years   Highest education level: 12th grade  Occupational History   Occupation: On disability  Tobacco Use   Smoking status: Every Day    Packs/day: 1.00    Types: Cigarettes   Smokeless tobacco: Never  Vaping Use   Vaping Use: Never used  Substance and Sexual Activity   Alcohol use: Not Currently    Comment: weekly   Drug use: Yes    Types: Marijuana    Comment: smokes hemp   Sexual activity: Yes    Birth control/protection: Condom  Other Topics Concern   Not on file  Social History Narrative   Pt lives alone in apartment complex for mentally ill   Social Determinants of Health   Financial Resource Strain: Not on file  Food Insecurity: Not on file  Transportation Needs: Not on file  Physical Activity: Not on file  Stress: Not on file  Social Connections: Not on file   Additional Social History:    Allergies:   Allergies  Allergen Reactions   Other     Some trial medicine from [facility].     Labs: No results found for this or any previous visit (from the past 48 hour(s)).  Medications:  Current Facility-Administered Medications  Medication Dose Route Frequency Provider Last Rate Last Admin   acetaminophen (TYLENOL) tablet 650 mg  650 mg Oral Q6H PRN Terrilee Files, MD   650 mg at 12/06/21 0836   atorvastatin (LIPITOR) tablet 40 mg  40 mg Oral Marvis Moeller, MD   40 mg at 12/05/21 2135   benztropine (COGENTIN) tablet 0.5 mg  0.5 mg Oral BID PRN Leevy-Johnson, Brooke A, NP   0.5 mg at 12/05/21 1707   Or   benztropine mesylate (COGENTIN) injection 0.5 mg  0.5 mg Intramuscular BID PRN  Leevy-Johnson, Brooke A, NP       divalproex (DEPAKOTE) DR tablet 750 mg  750 mg Oral Q12H Dung Salinger C, FNP   750 mg at 12/06/21 0836   ibuprofen (ADVIL) tablet 800 mg  800 mg Oral Q6H PRN Eber Hong, MD   800 mg at 12/01/21 1350   metFORMIN (GLUCOPHAGE) tablet 1,000 mg  1,000 mg Oral BID WC Terald Sleeper, MD   1,000 mg at 12/06/21 0719   nicotine (NICODERM CQ - dosed in mg/24 hours) patch 21 mg  21 mg Transdermal Daily Eber Hong, MD   21 mg at 12/06/21 0836   OLANZapine zydis (ZYPREXA) disintegrating tablet 10 mg  10 mg Oral Q8H PRN Leevy-Johnson, Brooke A, NP   10 mg at 12/05/21 0912   paliperidone (INVEGA) 24 hr tablet 6 mg  6 mg Oral Daily Leevy-Johnson, Brooke  A, NP   6 mg at 12/06/21 0916   polyethylene glycol (MIRALAX / GLYCOLAX) packet 17 g  17 g Oral Daily PRN Tanda Rockers A, DO   17 g at 12/03/21 1432   Current Outpatient Medications  Medication Sig Dispense Refill   ABILIFY MAINTENA 400 MG PRSY prefilled syringe Inject 400 mg into the muscle every 28 (twenty-eight) days.     atorvastatin (LIPITOR) 40 MG tablet Take 40 mg by mouth at bedtime.     ibuprofen (ADVIL) 800 MG tablet Take 800 mg by mouth 2 (two) times daily as needed for fever, mild pain or headache.     metFORMIN (GLUCOPHAGE) 500 MG tablet Take 2 tablets (1,000 mg total) by mouth 2 (two) times daily with a meal. 60 tablet 0   naproxen (NAPROSYN) 500 MG tablet Take 1 tablet (500 mg total) by mouth 2 (two) times daily. 10 tablet 0    Musculoskeletal: Strength & Muscle Tone: within normal limits Gait & Station: normal Patient leans: N/A  Psychiatric Specialty Exam:  Presentation  General Appearance: Appropriate for Environment; Casual; Fairly Groomed  Eye Contact:Good  Speech:Clear and Coherent; Normal Rate  Speech Volume:Normal  Handedness:Right  Mood and Affect  Mood:Euthymic  Affect:Appropriate  Thought Process  Thought Processes:Disorganized (Talking about seeing heavenly beings above  visiting the earthly people.)  Descriptions of Associations:Loose  Orientation:Full (Time, Place and Person)  Thought Content:Illogical; Scattered  History of Schizophrenia/Schizoaffective disorder:Yes  Duration of Psychotic Symptoms:Greater than six months  Hallucinations:Hallucinations: None  Ideas of Reference:Paranoia  Suicidal Thoughts:Suicidal Thoughts: No  Homicidal Thoughts:Homicidal Thoughts: No  Sensorium  Memory:Immediate Fair; Recent Fair; Remote Fair  Judgment:Impaired  Insight:Shallow  Executive Functions  Concentration:Fair  Attention Span:Fair  Recall:Fair  Fund of Knowledge:Fair  Language:Good   Psychomotor Activity  Psychomotor Activity:Psychomotor Activity: Normal  Assets  Assets:Communication Skills; Financial Resources/Insurance; Physical Health; Resilience  Sleep  Sleep:Sleep: Good Number of Hours of Sleep: 7  Physical Exam: Physical Exam Vitals and nursing note reviewed.  HENT:     Head: Normocephalic and atraumatic.     Right Ear: External ear normal.     Left Ear: External ear normal.     Nose: Nose normal.     Mouth/Throat:     Mouth: Mucous membranes are moist.     Pharynx: Oropharynx is clear.  Eyes:     Extraocular Movements: Extraocular movements intact.     Conjunctiva/sclera: Conjunctivae normal.     Pupils: Pupils are equal, round, and reactive to light.  Cardiovascular:     Rate and Rhythm: Normal rate.     Pulses: Normal pulses.     Comments: BP 160/109, P 96, Nursing staff to recheck VS Pulmonary:     Effort: Pulmonary effort is normal.  Abdominal:     Palpations: Abdomen is soft.  Genitourinary:    Comments: deferred Musculoskeletal:        General: Normal range of motion.     Cervical back: Normal range of motion and neck supple.  Skin:    General: Skin is warm.  Neurological:     General: No focal deficit present.     Mental Status: He is oriented to person, place, and time.  Psychiatric:         Behavior: Behavior normal.    Review of Systems  Constitutional: Negative.  Negative for chills and fever.  HENT: Negative.  Negative for hearing loss and tinnitus.   Eyes:  Negative for blurred vision and double vision.  Respiratory: Negative.  Negative for cough, sputum production, shortness of breath and wheezing.   Cardiovascular: Negative.  Negative for chest pain and palpitations.       BP 160/109, P 96, Nursing staff to recheck VS   Gastrointestinal: Negative.  Negative for heartburn and nausea.  Genitourinary: Negative.  Negative for dysuria, frequency and urgency.  Musculoskeletal: Negative.  Negative for myalgias and neck pain.  Skin: Negative.  Negative for itching and rash.  Neurological: Negative.  Negative for dizziness, tingling, tremors and headaches.  Endo/Heme/Allergies: Negative.  Negative for environmental allergies. Does not bruise/bleed easily.       Other Other   Not Specified  07/31/2014 Some trial medicine from [facility].  Deletion Reason:       Psychiatric/Behavioral:  Positive for depression and suicidal ideas.    Blood pressure (!) 160/109, pulse 96, temperature 97.9 F (36.6 C), temperature source Oral, resp. rate 16, height 6\' 1"  (1.854 m), weight 119.2 kg, SpO2 100 %. Body mass index is 34.66 kg/m.  Treatment Plan Summary: Daily contact with patient to assess and evaluate symptoms and progress in treatment and Medication management  Disposition: Recommend psychiatric Inpatient admission when medically cleared.  This service was provided via telemedicine using a 2-way, interactive audio and video technology.  Names of all persons participating in this telemedicine service and their role in this encounter. Name: [name] Role: Patient  Name: Garrison Columbus, NP Role: Provider  Name: Dr. Dwyane Dee Role: Medical Director  Name: Dr. Langston Masker Role: Forestine Na, ED physician    Laretta Bolster, [place] 12/06/2021 3:08 PM

## 2021-12-06 NOTE — Progress Notes (Signed)
Pt is under review with [facility]. CSW will assist with placement.   Maryjean Ka, MSW, LCSWA 12/06/2021 1:43 PM

## 2021-12-06 NOTE — Progress Notes (Signed)
CSW sent referral directly to [facility] via secure email with admissions for review and send to [facility]. CSW will assist and follow up with placement.  Maryjean Ka, MSW, [facility] 12/06/2021 11:44 AM

## 2021-12-06 NOTE — Progress Notes (Signed)
Inpatient Behavioral Health Placement  Meets inpatient criteria per Alan Mulder, NP  Referral was sent to the following facilities;    Destination Service Provider Address Phone Fax  [facility]  [address] [place] [address] [phone]  [facility]  [address] [place] [address] [phone]  [facility]  [address] [place], [place] [address] [phone]  [facility]  [address] [place] [address] [phone]  [facility]  [address] [place] [address] [phone]  [facility]  [address], [place] [address] [phone]  [facility]  [address], [place] [address] [phone]  [facility]  [address], [place] [address] [phone]  [facility]  [address] [place] [address] [phone]  [facility]  [address], [place] [address] [phone]  [facility]  [address] [place] [address] [phone]  [facility]  [address] [place], [place] [address] [phone]  [facility]  [address] [place] [address] [phone]  [facility]  [address], [place] [address] [phone]  [facility]  [address], [place] [address] [phone]  [facility]  [address] [place] [address] [phone]  [facility]  [address] [place], [place] [address] [phone]  [facility]  [address] [place] [address] [phone]  [facility]  [address], [place] [address] [phone]  [facility]  [address] [place] [address] [phone]  [facility]  [address], [place] [address] [phone]  [facility]  [address], [place] [address] [phone]  [facility]  [address] [place] [address] [phone]   Situation ongoing,  CSW will follow up.   Maryjean Ka, MSW, LCSWA 12/06/2021  @ 11:35 AM

## 2021-12-06 NOTE — ED Notes (Signed)
Alan Mulder, psych NP, states pt remains not psych clear and continues to await inpt placement

## 2021-12-06 NOTE — ED Notes (Signed)
Pt kept coming out into the hall after being told to stay in his room. Ativan given

## 2021-12-06 NOTE — ED Notes (Signed)
Pt used phn to call his mother.

## 2021-12-06 NOTE — ED Notes (Signed)
IVC paperwork renewed and faxed to magistrate at this time. 

## 2021-12-06 NOTE — Progress Notes (Signed)
Pt has been denied at [facility] due to pt being too high acuity. CSW will continue to assist with placement.  Maryjean Ka, MSW, LCSWA 12/06/2021 12:21 PM

## 2021-12-07 ENCOUNTER — Inpatient Hospital Stay (HOSPITAL_COMMUNITY)
Admission: EM | Admit: 2021-12-07 | Discharge: 2022-01-09 | DRG: 885 | Disposition: A | Discharge status: Home / Self Care | Payer: Medicare Other | Source: Intra-hospital | Attending: Psychiatry | Admitting: Psychiatry

## 2021-12-07 DIAGNOSIS — F419 Anxiety disorder, unspecified: Secondary | ICD-10-CM | POA: Diagnosis present

## 2021-12-07 DIAGNOSIS — F29 Unspecified psychosis not due to a substance or known physiological condition: Secondary | ICD-10-CM | POA: Insufficient documentation

## 2021-12-07 DIAGNOSIS — E119 Type 2 diabetes mellitus without complications: Secondary | ICD-10-CM | POA: Diagnosis not present

## 2021-12-07 DIAGNOSIS — Z6834 Body mass index (BMI) 34.0-34.9, adult: Secondary | ICD-10-CM | POA: Diagnosis not present

## 2021-12-07 DIAGNOSIS — Z79899 Other long term (current) drug therapy: Secondary | ICD-10-CM

## 2021-12-07 DIAGNOSIS — Z20822 Contact with and (suspected) exposure to covid-19: Secondary | ICD-10-CM | POA: Diagnosis present

## 2021-12-07 DIAGNOSIS — F17200 Nicotine dependence, unspecified, uncomplicated: Secondary | ICD-10-CM | POA: Diagnosis not present

## 2021-12-07 DIAGNOSIS — F250 Schizoaffective disorder, bipolar type: Secondary | ICD-10-CM | POA: Diagnosis present

## 2021-12-07 DIAGNOSIS — R61 Generalized hyperhidrosis: Secondary | ICD-10-CM | POA: Diagnosis present

## 2021-12-07 DIAGNOSIS — M25561 Pain in right knee: Secondary | ICD-10-CM | POA: Diagnosis not present

## 2021-12-07 DIAGNOSIS — E669 Obesity, unspecified: Secondary | ICD-10-CM | POA: Diagnosis present

## 2021-12-07 DIAGNOSIS — K219 Gastro-esophageal reflux disease without esophagitis: Secondary | ICD-10-CM | POA: Diagnosis not present

## 2021-12-07 DIAGNOSIS — Z7984 Long term (current) use of oral hypoglycemic drugs: Secondary | ICD-10-CM | POA: Diagnosis not present

## 2021-12-07 DIAGNOSIS — F17210 Nicotine dependence, cigarettes, uncomplicated: Secondary | ICD-10-CM | POA: Diagnosis present

## 2021-12-07 DIAGNOSIS — Z91199 Patient's noncompliance with other medical treatment and regimen due to unspecified reason: Secondary | ICD-10-CM | POA: Diagnosis not present

## 2021-12-07 DIAGNOSIS — Z560 Unemployment, unspecified: Secondary | ICD-10-CM | POA: Diagnosis not present

## 2021-12-07 DIAGNOSIS — E785 Hyperlipidemia, unspecified: Secondary | ICD-10-CM | POA: Diagnosis not present

## 2021-12-07 DIAGNOSIS — N3944 Nocturnal enuresis: Secondary | ICD-10-CM | POA: Diagnosis present

## 2021-12-07 DIAGNOSIS — Z62810 Personal history of physical and sexual abuse in childhood: Secondary | ICD-10-CM | POA: Diagnosis present

## 2021-12-07 DIAGNOSIS — G4700 Insomnia, unspecified: Secondary | ICD-10-CM | POA: Diagnosis not present

## 2021-12-07 DIAGNOSIS — R350 Frequency of micturition: Secondary | ICD-10-CM | POA: Diagnosis not present

## 2021-12-07 DIAGNOSIS — Z818 Family history of other mental and behavioral disorders: Secondary | ICD-10-CM | POA: Diagnosis not present

## 2021-12-07 DIAGNOSIS — K5900 Constipation, unspecified: Secondary | ICD-10-CM | POA: Diagnosis not present

## 2021-12-07 LAB — SARS CORONAVIRUS 2 BY RT PCR: SARS Coronavirus 2 by RT PCR: NEGATIVE

## 2021-12-07 MED ORDER — METFORMIN HCL 500 MG PO TABS
1000.0000 mg | ORAL_TABLET | Freq: Two times a day (BID) | ORAL | Status: DC
  Administered 2021-12-08 – 2022-01-09 (×65): 1000 mg via ORAL
  Filled 2021-12-07 (×72): qty 2

## 2021-12-07 MED ORDER — PALIPERIDONE ER 6 MG PO TB24: 9.0000 mg | ORAL_TABLET | Freq: Every day | ORAL | Status: DC

## 2021-12-07 MED ORDER — ALUM & MAG HYDROXIDE-SIMETH 200-200-20 MG/5ML PO SUSP
30.0000 mL | ORAL | Status: DC | PRN
  Administered 2021-12-11 – 2021-12-20 (×2): 30 mL via ORAL
  Filled 2021-12-07 (×3): qty 30

## 2021-12-07 MED ORDER — ZIPRASIDONE MESYLATE 20 MG IM SOLR: 20.0000 mg | INTRAMUSCULAR | Status: DC | PRN

## 2021-12-07 MED ORDER — TRAZODONE HCL 50 MG PO TABS
50.0000 mg | ORAL_TABLET | Freq: Every evening | ORAL | Status: DC | PRN
  Administered 2021-12-08 – 2021-12-14 (×5): 50 mg via ORAL
  Filled 2021-12-07 (×5): qty 1

## 2021-12-07 MED ORDER — IBUPROFEN 800 MG PO TABS
800.0000 mg | ORAL_TABLET | Freq: Two times a day (BID) | ORAL | Status: DC | PRN
  Administered 2021-12-07 – 2022-01-09 (×28): 800 mg via ORAL
  Filled 2021-12-07 (×28): qty 1

## 2021-12-07 MED ORDER — BENZTROPINE MESYLATE 0.5 MG PO TABS
0.5000 mg | ORAL_TABLET | Freq: Two times a day (BID) | ORAL | Status: DC
  Administered 2021-12-08 – 2021-12-13 (×11): 0.5 mg via ORAL
  Filled 2021-12-07 (×16): qty 1

## 2021-12-07 MED ORDER — HYDROXYZINE HCL 25 MG PO TABS
25.0000 mg | ORAL_TABLET | Freq: Three times a day (TID) | ORAL | Status: DC | PRN
  Administered 2021-12-08 – 2022-01-08 (×25): 25 mg via ORAL
  Filled 2021-12-07 (×26): qty 1

## 2021-12-07 MED ORDER — OLANZAPINE 10 MG PO TBDP
10.0000 mg | ORAL_TABLET | Freq: Three times a day (TID) | ORAL | Status: DC | PRN
  Administered 2021-12-12: 10 mg via ORAL
  Filled 2021-12-07: qty 1

## 2021-12-07 MED ORDER — MAGNESIUM HYDROXIDE 400 MG/5ML PO SUSP
30.0000 mL | Freq: Every day | ORAL | Status: DC | PRN
  Administered 2021-12-08 – 2021-12-10 (×2): 30 mL via ORAL
  Filled 2021-12-07 (×2): qty 30

## 2021-12-07 MED ORDER — ATORVASTATIN CALCIUM 40 MG PO TABS
40.0000 mg | ORAL_TABLET | Freq: Every day | ORAL | Status: DC
  Filled 2021-12-07: qty 1

## 2021-12-07 MED ORDER — ACETAMINOPHEN 325 MG PO TABS: 650.0000 mg | ORAL_TABLET | Freq: Four times a day (QID) | ORAL | Status: DC | PRN

## 2021-12-07 MED ORDER — PALIPERIDONE ER 6 MG PO TB24
6.0000 mg | ORAL_TABLET | Freq: Every day | ORAL | Status: DC
  Administered 2021-12-08: 6 mg via ORAL
  Filled 2021-12-07 (×3): qty 1

## 2021-12-07 MED ORDER — ATORVASTATIN CALCIUM 40 MG PO TABS
40.0000 mg | ORAL_TABLET | Freq: Every day | ORAL | Status: DC
Start: 2021-12-07 — End: 2022-01-09
  Administered 2021-12-07 – 2022-01-08 (×33): 40 mg via ORAL
  Filled 2021-12-07 (×35): qty 1

## 2021-12-07 MED ORDER — LORAZEPAM 1 MG PO TABS
1.0000 mg | ORAL_TABLET | ORAL | Status: AC | PRN
  Administered 2021-12-10: 1 mg via ORAL
  Filled 2021-12-07: qty 1

## 2021-12-07 MED ORDER — DIVALPROEX SODIUM 250 MG PO DR TAB
750.0000 mg | DELAYED_RELEASE_TABLET | Freq: Two times a day (BID) | ORAL | Status: DC
Start: 2021-12-07 — End: 2021-12-14
  Administered 2021-12-07 – 2021-12-14 (×14): 750 mg via ORAL
  Filled 2021-12-07 (×18): qty 1

## 2021-12-07 NOTE — ED Notes (Signed)
[name] mother visiting at this time

## 2021-12-07 NOTE — ED Notes (Signed)
[name] breakfast has arrived. Pt sitting up and eating breakfast

## 2021-12-07 NOTE — Progress Notes (Signed)
Pt was accepted to [facility] Today 12/07/21 PENDING IVC paperwork and Negative COVID-19; Bed Assignment 502-1.  Pt meets inpatient criteria per Maxie Barb, NP.  Attending Physician will be  Dr. Abbott Pao  Report can be called to: -Adult unit: [phone]  Pt can arrive: [facility] will coordinate   Care Team notified: [facility], RN, Sharol Roussel, RN, Marcha Solders, RN and Maxie Barb, NP.  Kelton Pillar, LCSWA 12/07/2021 @ 12:23 PM

## 2021-12-07 NOTE — ED Notes (Signed)
[name] dinner has arrived, pt sat up and eating his dinner

## 2021-12-07 NOTE — Consult Note (Signed)
Telepsych Consultation   Reason for Consult:  psychosis Referring Physician:  Glendora Score, MD Location of Patient:  [facility] [phone] Location of Provider: Behavioral Health TTS Department  Patient Identification: [name] MRN:  [id number] Principal Diagnosis: Psychosis [facility]) Diagnosis:  Principal Problem:   Psychosis (HCC) Active Problems:   Schizoaffective disorder, bipolar type (HCC)   Total Time spent with patient: 20 minutes  Subjective:   [name] is a 46 y.o. male patient admitted with psychosis.  Patient presents alert and oriented to person, place, partial to time, and situation; calm and cooperative.  "I'm thinking about changing my name one day when I get married but I'm not going to change it until I get married". When asked about reason for coming to ED, pt states "I don't want to talk about it" then proceeds to talk about snake being found in his room and roommates. Tangential, perseverative on subject of snakes and interacting with "cobras".   He denies any suicidal or homicidal ideations, auditory or visual hallucinations. Remains delusional with some grandieur about having "powers" including the ability to have teeth grow; mentions needing a "chip" placed in his head.   HPI:  [name] is a 46 year old male patient with past psychiatric history of schizoaffective disorder bipolar type, schizophrenia, intentional overdose, who presented to [facility] 11/29/21 via IVC for psychosis. Per chart review, x6 ED encounters since 11/22/21, 9 since 10/28/21. No active community supports; TOC/SW consult placed for [facility] referral. UDS-, BAL<10. PDMP reviewed, no recent medications.   Past Psychiatric History: schizoaffective disorder, psychosis  Risk to Self:   Risk to Others:   Prior Inpatient Therapy:   Prior Outpatient Therapy:    Past Medical History:  Past Medical History:  Diagnosis Date   Bipolar disorder (HCC)    Bronchitis    Hyperlipidemia     Paranoid schizophrenia (HCC)    Schizophrenia (HCC)     Past Surgical History:  Procedure Laterality Date   TESTICLE IMPLANTATION TO THIGH     TESTICLE SURGERY     Family History:  Family History  Problem Relation Age of Onset   Schizophrenia Neg Hx    Family Psychiatric  History: not noted Social History:  Social History   Substance and Sexual Activity  Alcohol Use Not Currently   Comment: weekly     Social History   Substance and Sexual Activity  Drug Use Yes   Types: Marijuana   Comment: smokes hemp    Social History   Socioeconomic History   Marital status: Single    Spouse name: Not on file   Number of children: Not on file   Years of education: 12 years   Highest education level: 12th grade  Occupational History   Occupation: On disability  Tobacco Use   Smoking status: Every Day    Packs/day: 1.00    Types: Cigarettes   Smokeless tobacco: Never  Vaping Use   Vaping Use: Never used  Substance and Sexual Activity   Alcohol use: Not Currently    Comment: weekly   Drug use: Yes    Types: Marijuana    Comment: smokes hemp   Sexual activity: Yes    Birth control/protection: Condom  Other Topics Concern   Not on file  Social History Narrative   Pt lives alone in apartment complex for mentally ill   Social Determinants of Health   Financial Resource Strain: Not on file  Food Insecurity: Not on file  Transportation Needs:  Not on file  Physical Activity: Not on file  Stress: Not on file  Social Connections: Not on file   Additional Social History:    Allergies:   Allergies  Allergen Reactions   Other     Some trial medicine from [facility].     Labs:  Results for orders placed or performed during the hospital encounter of 11/29/21 (from the past 48 hour(s))  Valproic acid level     Status: None   Collection Time: 12/06/21  4:10 PM  Result Value Ref Range   Valproic Acid Lvl 65 50.0 - 100.0 ug/mL    Comment: Performed at [facility],  [address], [place], [place] [address]    Medications:  Current Facility-Administered Medications  Medication Dose Route Frequency Provider Last Rate Last Admin   acetaminophen (TYLENOL) tablet 650 mg  650 mg Oral Q6H PRN Terrilee Files, MD   650 mg at 12/06/21 0836   atorvastatin (LIPITOR) tablet 40 mg  40 mg Oral Marvis Moeller, MD   40 mg at 12/06/21 2117   benztropine (COGENTIN) tablet 0.5 mg  0.5 mg Oral BID PRN Leevy-Johnson, Ledia Hanford A, NP   0.5 mg at 12/07/21 0010   Or   benztropine mesylate (COGENTIN) injection 0.5 mg  0.5 mg Intramuscular BID PRN Leevy-Johnson, Shantasia Hunnell A, NP       divalproex (DEPAKOTE) DR tablet 750 mg  750 mg Oral Q12H Ntuen, Tina C, FNP   750 mg at 12/07/21 0900   ibuprofen (ADVIL) tablet 800 mg  800 mg Oral Q6H PRN Eber Hong, MD   800 mg at 12/01/21 1350   metFORMIN (GLUCOPHAGE) tablet 1,000 mg  1,000 mg Oral BID WC Terald Sleeper, MD   1,000 mg at 12/07/21 0844   nicotine (NICODERM CQ - dosed in mg/24 hours) patch 21 mg  21 mg Transdermal Daily Eber Hong, MD   21 mg at 12/07/21 0900   OLANZapine zydis (ZYPREXA) disintegrating tablet 10 mg  10 mg Oral Q8H PRN Leevy-Johnson, Kou Gucciardo A, NP   10 mg at 12/07/21 0010   paliperidone (INVEGA) 24 hr tablet 6 mg  6 mg Oral Daily Leevy-Johnson, Roxane Puerto A, NP   6 mg at 12/07/21 0900   polyethylene glycol (MIRALAX / GLYCOLAX) packet 17 g  17 g Oral Daily PRN Tanda Rockers A, DO   17 g at 12/03/21 1432   Current Outpatient Medications  Medication Sig Dispense Refill   ABILIFY MAINTENA 400 MG PRSY prefilled syringe Inject 400 mg into the muscle every 28 (twenty-eight) days.     atorvastatin (LIPITOR) 40 MG tablet Take 40 mg by mouth at bedtime.     ibuprofen (ADVIL) 800 MG tablet Take 800 mg by mouth 2 (two) times daily as needed for fever, mild pain or headache.     metFORMIN (GLUCOPHAGE) 500 MG tablet Take 2 tablets (1,000 mg total) by mouth 2 (two) times daily with a meal. 60 tablet 0   naproxen (NAPROSYN) 500 MG  tablet Take 1 tablet (500 mg total) by mouth 2 (two) times daily. 10 tablet 0    Musculoskeletal: Strength & Muscle Tone: within normal limits Gait & Station: normal Patient leans: N/A  Psychiatric Specialty Exam:  Presentation  General Appearance: Appropriate for Environment; Casual; Fairly Groomed  Eye Contact:Good  Speech:Clear and Coherent; Normal Rate  Speech Volume:Normal  Handedness:Right   Mood and Affect  Mood:Euthymic  Affect:Appropriate   Thought Process  Thought Processes:Disorganized (Talking about seeing heavenly beings above visiting  the earthly people.)  Descriptions of Associations:Loose  Orientation:Full (Time, Place and Person)  Thought Content:Illogical; Scattered  History of Schizophrenia/Schizoaffective disorder:Yes  Duration of Psychotic Symptoms:Greater than six months  Hallucinations:Hallucinations: None  Ideas of Reference:Paranoia  Suicidal Thoughts:Suicidal Thoughts: No  Homicidal Thoughts:Homicidal Thoughts: No   Sensorium  Memory:Immediate Fair; Recent Fair; Remote Fair  Judgment:Impaired  Insight:Shallow   Executive Functions  Concentration:Fair  Attention Span:Fair  Hatch  Language:Good   Psychomotor Activity  Psychomotor Activity:Psychomotor Activity: Normal   Assets  Assets:Communication Skills; Financial Resources/Insurance; Physical Health; Resilience   Sleep  Sleep:Sleep: Good Number of Hours of Sleep: 7    Physical Exam: Physical Exam Vitals and nursing note reviewed.  Constitutional:      Appearance: He is not ill-appearing or toxic-appearing.  HENT:     Head: Normocephalic.     Nose: Nose normal.     Mouth/Throat:     Mouth: Mucous membranes are moist.     Pharynx: Oropharynx is clear.  Eyes:     Pupils: Pupils are equal, round, and reactive to light.  Cardiovascular:     Rate and Rhythm: Normal rate.     Pulses: Normal pulses.  Pulmonary:      Effort: Pulmonary effort is normal.  Abdominal:     Palpations: Abdomen is soft.  Musculoskeletal:        General: Normal range of motion.     Cervical back: Normal range of motion.  Skin:    General: Skin is warm and dry.  Neurological:     Mental Status: He is alert and oriented to person, place, and time.  Psychiatric:        Attention and Perception: He is inattentive.        Mood and Affect: Affect is flat.        Speech: Speech is tangential.        Behavior: Behavior is cooperative.        Thought Content: Thought content is delusional. Thought content does not include homicidal or suicidal ideation. Thought content does not include homicidal or suicidal plan.        Cognition and Memory: Cognition and memory normal.        Judgment: Judgment is impulsive.   Review of Systems  All other systems reviewed and are negative.  Blood pressure 125/65, pulse (!) 122, temperature 98 F (36.7 C), temperature source Oral, resp. rate 18, height 6\' 1"  (1.854 m), weight 119.2 kg, SpO2 100 %. Body mass index is 34.66 kg/m.  Treatment Plan Summary: Daily contact with patient to assess and evaluate symptoms and progress in treatment, Medication management, and Plan Medications adjusted: Paliperidone increased to 9 mg daily. SW contacted to confirm completion of [facility] referral from 12/01/21 [facility] consult. Seek inpatient psychiatric care.  Under review at [facility]  Disposition: Recommend psychiatric Inpatient admission when medically cleared. Supportive therapy provided about ongoing stressors. Discussed crisis plan, support from social network, calling 911, coming to the Emergency Department, and calling Suicide Hotline.  This service was provided via telemedicine using a 2-way, interactive audio and video technology.  Names of all persons participating in this telemedicine service and their role in this encounter. Name: Oneida Alar Role: PMHNP  Name: Hampton Abbot Role: Attending MD   Name: [name] Role: patient  Name:  Role:     Inda Merlin, NP 12/07/2021 9:21 AM

## 2021-12-07 NOTE — ED Provider Notes (Signed)
Emergency Medicine Observation Re-evaluation Note  [name] is a 46 y.o. male, seen on rounds today.  Pt initially presented to the ED for complaints of Psychiatric Evaluation Currently, the patient is resting quietly.  Physical Exam  BP 125/65 (BP Location: Right Arm)   Pulse (!) 122   Temp 98 F (36.7 C) (Oral)   Resp 18   Ht 6\' 1"  (1.854 m)   Wt 119.2 kg   SpO2 100%   BMI 34.66 kg/m  Physical Exam General: No acute distress Cardiac: Well-perfused Lungs: Nonlabored Psych: Cooperative  ED Course / MDM  EKG:   I have reviewed the labs performed to date as well as medications administered while in observation.  Recent changes in the last 24 hours include psychiatric reassessment.  Plan  Current plan is for inpatient psychiatric. JAYEL INKS is under involuntary commitment.      Nichola Sizer, MD 12/07/21 1806

## 2021-12-07 NOTE — ED Notes (Signed)
[name] lunch has arrived, pt sitting up and eating lunch

## 2021-12-07 NOTE — ED Notes (Signed)
Pt brushed his teeth, changed his linen and took a shower

## 2021-12-08 ENCOUNTER — Encounter (HOSPITAL_COMMUNITY): Payer: Self-pay | Admitting: Psychiatry

## 2021-12-08 ENCOUNTER — Other Ambulatory Visit: Payer: Self-pay

## 2021-12-08 DIAGNOSIS — F29 Unspecified psychosis not due to a substance or known physiological condition: Secondary | ICD-10-CM | POA: Diagnosis not present

## 2021-12-08 LAB — CBC
HCT: 39.3 % (ref 39.0–52.0)
Hemoglobin: 12.9 g/dL — ABNORMAL LOW (ref 13.0–17.0)
MCH: 29.8 pg (ref 26.0–34.0)
MCHC: 32.8 g/dL (ref 30.0–36.0)
MCV: 90.8 fL (ref 80.0–100.0)
Platelets: 364 10*3/uL (ref 150–400)
RBC: 4.33 MIL/uL (ref 4.22–5.81)
RDW: 12.2 % (ref 11.5–15.5)
WBC: 9.1 10*3/uL (ref 4.0–10.5)
nRBC: 0 % (ref 0.0–0.2)

## 2021-12-08 LAB — COMPREHENSIVE METABOLIC PANEL
ALT: 21 U/L (ref 0–44)
AST: 18 U/L (ref 15–41)
Albumin: 3.9 g/dL (ref 3.5–5.0)
Alkaline Phosphatase: 61 U/L (ref 38–126)
Anion gap: 9 (ref 5–15)
BUN: 9 mg/dL (ref 6–20)
CO2: 30 mmol/L (ref 22–32)
Calcium: 9.3 mg/dL (ref 8.9–10.3)
Chloride: 104 mmol/L (ref 98–111)
Creatinine, Ser: 0.99 mg/dL (ref 0.61–1.24)
GFR, Estimated: >60 mL/min (ref >60)
Glucose, Bld: 91 mg/dL (ref 70–99)
Potassium: 3.7 mmol/L (ref 3.5–5.1)
Sodium: 143 mmol/L (ref 135–145)
Total Bilirubin: 0.4 mg/dL (ref 0.3–1.2)
Total Protein: 7.1 g/dL (ref 6.5–8.1)

## 2021-12-08 LAB — GLUCOSE, CAPILLARY: Glucose-Capillary: 127 mg/dL — ABNORMAL HIGH (ref 70–99)

## 2021-12-08 MED ORDER — NICOTINE 21 MG/24HR TD PT24
21.0000 mg | MEDICATED_PATCH | Freq: Every day | TRANSDERMAL | Status: DC
  Administered 2021-12-08 – 2022-01-06 (×29): 21 mg via TRANSDERMAL
  Filled 2021-12-08 (×36): qty 1

## 2021-12-08 MED ORDER — INSULIN ASPART 100 UNIT/ML IJ SOLN: 0.0000 [IU] | Freq: Three times a day (TID) | INTRAMUSCULAR | Status: DC

## 2021-12-08 MED ORDER — OLANZAPINE 10 MG PO TABS
10.0000 mg | ORAL_TABLET | Freq: Two times a day (BID) | ORAL | Status: DC
  Administered 2021-12-08 – 2021-12-12 (×8): 10 mg via ORAL
  Filled 2021-12-08 (×12): qty 1

## 2021-12-08 MED ORDER — EMPAGLIFLOZIN 10 MG PO TABS
10.0000 mg | ORAL_TABLET | Freq: Every day | ORAL | Status: DC
  Administered 2021-12-09 – 2022-01-09 (×32): 10 mg via ORAL
  Filled 2021-12-08 (×34): qty 1

## 2021-12-08 MED ORDER — INSULIN ASPART 100 UNIT/ML IJ SOLN: 0.0000 [IU] | Freq: Every day | INTRAMUSCULAR | Status: DC

## 2021-12-08 NOTE — [facility] Suicide Risk Assessment (Signed)
Suicide Risk Assessment  Admission Assessment    [facility] Admission Suicide Risk Assessment   Nursing information obtained from:  Patient Demographic factors:  Male, Living alone, Unemployed Current Mental Status:  NA Loss Factors:  Loss of significant relationship Historical Factors:  Family history of mental illness or substance abuse, Impulsivity Risk Reduction Factors:  Positive social support  Total Time spent with patient: 45 minutes Principal Problem: Schizoaffective disorder, bipolar type (HCC) Diagnosis:  Principal Problem:   Schizoaffective disorder, bipolar type (HCC) Active Problems:   Tobacco use disorder  Subjective Data: [name] is a 46 year old male with a reported past psychiatric history of schizophrenia-paranoid, bipolar disorder housing insecurity who presented to [facility] ED on 8/3 under IVC for psychosis and bizarre behaviors.     On Chart Review: According to the IVC patient barricaded himself in his apartment to hide from the police, and on first exam, patient destroyed his parent's property while responding to internal stimuli.  Continued Clinical Symptoms:  Alcohol Use Disorder Identification Test Final Score (AUDIT): 2 The "Alcohol Use Disorders Identification Test", Guidelines for Use in Primary Care, Second Edition.  World Science writer [facility]). Score between 0-7:  no or low risk or alcohol related problems. Score between 8-15:  moderate risk of alcohol related problems. Score between 16-19:  high risk of alcohol related problems. Score 20 or above:  warrants further diagnostic evaluation for alcohol dependence and treatment.   CLINICAL FACTORS:   Bipolar Disorder:   Mixed State Schizophrenia:   Paranoid or undifferentiated type More than one psychiatric diagnosis Currently Psychotic Previous Psychiatric Diagnoses and Treatments Medical Diagnoses and Treatments/Surgeries   Musculoskeletal: Strength & Muscle Tone: within normal limits Gait  & Station: normal Patient leans: N/A  Psychiatric Specialty Exam:  Presentation  General Appearance: Casual  Eye Contact:Good  Speech:Clear and Coherent; Pressured  Speech Volume:Normal  Handedness:Right   Mood and Affect  Mood:Euthymic  Affect:Blunt   Thought Process  Thought Processes:Disorganized  Descriptions of Associations:Tangential  Orientation:Partial  Thought Content:Illogical; Paranoid Ideation; Perseveration; Tangential  History of Schizophrenia/Schizoaffective disorder:Yes  Duration of Psychotic Symptoms:Greater than six months  Hallucinations:Hallucinations: Auditory Description of Auditory Hallucinations: Hearing the president and God talk to him through the TV  Ideas of Reference:Delusions; Paranoia  Suicidal Thoughts:Suicidal Thoughts: No  Homicidal Thoughts:Homicidal Thoughts: No   Sensorium  Memory:Immediate Fair; Recent Fair  Judgment:Impaired  Insight:Poor   Executive Functions  Concentration:Fair  Attention Span:Fair  Recall:Fair  Fund of Knowledge:Fair  Language:Fair   Psychomotor Activity  Psychomotor Activity:Psychomotor Activity: Normal   Assets  Assets:Communication Skills; Resilience; Social Support   Sleep  Sleep:Sleep: Fair    Physical Exam: Vitals reviewed.  Constitutional:      General: He is not in acute distress. HENT:     Head: Normocephalic and atraumatic.     Mouth/Throat:     Mouth: Mucous membranes are moist.     Pharynx: Oropharynx is clear.  Pulmonary:     Effort: Pulmonary effort is normal.  Skin:    General: Skin is warm and dry.  Neurological:     Mental Status: He is alert.      Review of Systems  Cardiovascular:  Negative for chest pain.  Gastrointestinal: Negative.   Genitourinary: Negative.   Musculoskeletal:  Positive for joint pain.  Neurological:  Negative for dizziness, seizures, weakness and headaches.  Blood pressure 124/79, pulse (!) 123, temperature 98 F (36.7  C), temperature source Oral, resp. rate 16, height 6\' 1"  (1.854 m), weight 117.9 kg,  SpO2 99 %. Body mass index is 34.3 kg/m.   COGNITIVE FEATURES THAT CONTRIBUTE TO RISK:  Loss of executive function    SUICIDE RISK:   Minimal: No identifiable suicidal ideation.  Patients presenting with no risk factors but with morbid ruminations; may be classified as minimal risk based on the severity of the depressive symptoms  PLAN OF CARE:  Daily contact with patient to assess and evaluate symptoms and progress in treatment and Medication management   Physician Treatment Plan for Primary Diagnosis: Schizoaffective disorder, bipolar type (HCC)   Long Term Goal(s): Improvement in symptoms so as ready for discharge   Short Term Goals: Ability to identify changes in lifestyle to reduce recurrence of condition will improve, Ability to verbalize feelings will improve, Ability to demonstrate self-control will improve, Ability to identify and develop effective coping behaviors will improve, and Compliance with prescribed medications will improve   Physician Treatment Plan for Secondary Diagnosis: Principal Problem:   Schizoaffective disorder, bipolar type (HCC) Active Problems:   Tobacco use disorder     Long Term Goal(s): Improvement in symptoms so as ready for discharge   Short Term Goals: Ability to identify changes in lifestyle to reduce recurrence of condition will improve, Ability to verbalize feelings will improve, Ability to demonstrate self-control will improve, Ability to identify and develop effective coping behaviors will improve, and Compliance with prescribed medications will improve   I certify that inpatient services furnished can reasonably be expected to improve the patient's condition.     Assessment:   Diagnoses / Active Problems:   Safety and Monitoring: INVOLUTARILY ( ) admission to inpatient psychiatric unit for safety, stabilization and treatment Daily contact with patient  to assess and evaluate symptoms and progress in treatment Patient's case to be discussed in multi-disciplinary team meeting Observation Level : q15 minute checks Vital signs: q12 hours Precautions: suicide, elopement, and assault   2. Psychiatric Diagnoses and Treatment # Schizoaffective disorder-bipolar type - Restart home Zyprexa 10 mg twice daily - Continue Depakote ER 750 mg twice daily             -Will check VPA level tomorrow a.m. -Continue Cogentin 0.5 mg twice daily   PRN: Agitation protocol with Zyprexa 10 mg, Geodon 20 mg, and Ativan 1 mg   -- The risks/benefits/side-effects/alternatives to this medication were discussed in detail with the patient and time was given for questions. The patient consents to medication trial.              -- Metabolic profile and EKG monitoring obtained while on an atypical antipsychotic  BMI: 34.3 Lipid Panel: TG 172 HbgA1c: 6.9% QTc:              -- Encouraged patient to participate in unit milieu and in scheduled group therapies      3. Medical Issues Being Addressed: #Nicotine use disorder - Nicotine patch 21 mg daily   #T2DM -Metformin 1000 mg twice daily with meals - Jardiance 10 mg every morning - CBG monitoring with moderate SSI   #Hyperlipidemia - Continue home atorvastatin 40 mg daily   4. Discharge Planning:              -- Social work and case management to assist with discharge planning and identification of hospital follow-up needs prior to discharge             -- Estimated LOS: 5-7 days             -- Discharge Concerns: Need  to establish a safety plan; Medication compliance and effectiveness             -- Discharge Goals: Return home with outpatient referrals for mental health follow-up including medication management/psychotherapy   Lamar Sprinkles, MD 12/08/2021, 5:43 PM

## 2021-12-08 NOTE — Group Note (Signed)
LCSW Group Therapy Note No social work group was held today due to newly separated halls necessitating a higher number of groups, a significant number of expected and unexpected discharges, a number of necessary Suicide Prevention Education calls to family members, and a high number of admissions that required initial psychosocial assessments.  The following was provided to each patient on 300 and 500 halls in lieu of in-person group:  Healthy vs. Unhealthy Coping Skills and Supports   Unhealthy Qualities                                             Healthy Qualities Works (at first) Works   Stops working or starts hurting Continues working  Fast Usually takes time to develop  Easy Often difficult to learn  Usually a habit Usually unknown, has to become a habit  Can do alone Often need to reach out for help   Leads to loss Leads to gain         My Unhealthy Coping Skills                                    My Healthy Coping Skills                       My Unhealthy Supports                                           My Healthy Supports                         James Baty Grossman-Orr, LCSW 12/08/2021  2:40 PM     

## 2021-12-08 NOTE — Progress Notes (Signed)
Pt reports experiencing constipation. Pt states, "I know I just had a bowel movement but I feel like I have so much more left in me." Pt decline waiting a few additional hours.

## 2021-12-08 NOTE — Progress Notes (Signed)
Patient ID: [name], male   DOB: 1976/03/17, 46 y.o.   MRN: [id number] Admission note: Patient is a Involuntary admission in no acute distress. Pt appears to have tangential thinking and flight of ideas. Pt speaks quickly and switching rapidly between ideas which makes it difficult to follow. Pt requires redirection to follow. To stated he has been staying with his mother for 3 days. Mother reports pt has been talking out of his head. Pt is endorsing no past suicidal attempt by self harm. Pt admitted to unit per protocol, skin assessment and belonging search done. No skin issues noted. Consent signed by pt. Pt educated on therapeutic milieu rules. Pt was introduced to milieu by nursing staff. Fall risk / suicide safety plan explained to the patient. 15 minutes checks started for safety.

## 2021-12-08 NOTE — Progress Notes (Signed)
Pt reports, "I just saw the green goblin on the floor making faces as me."

## 2021-12-08 NOTE — [facility] Group Notes (Signed)
Goals Group 78/2023   Group Focus: affirmation, clarity of thought, and goals/reality orientation Treatment Modality:  Psychoeducation Interventions utilized were assignment, group exercise, and support Purpose: To be able to understand and verbalize the reason for their admission to the hospital. To understand that the medication helps with their chemical imbalance but they also need to work on their choices in life. To be challenged to develop a list of 30 positives about themselves. Also introduce the concept that "feelings" are not reality.  Participation Level:  did not attend  [name] A 

## 2021-12-08 NOTE — Progress Notes (Signed)
Pt is A&OX4, calm, hyper-verbal, delusional, denies suicidal ideations, denies homicidal ideations, denies auditory hallucinations and denies visual hallucinations. Pt verbally agrees to approach staff if these become apparent and before harming self or others. Pt states he knows the cure for breast cancer. Pt states he is experiencing "growing pains" r/t right knee pain as he did in eight grade. Pt denies experiencing nightmares. Mood and affect are congruent. Pt appetite is ok. No complaints of anxiety and/or distress at this time. [name] memory appears to be grossly intact, and Pt hasn't displayed any injurious behaviors. Pt is medication compliant. There's no evidence of suicidal intent. Psychomotor activity was WNL. No s/s of Parkinson, Dystonia, Akathisia and/or Tardive Dyskinesia noted.

## 2021-12-08 NOTE — Tx Team (Signed)
Initial Treatment Plan 12/08/2021 12:42 AM [name] ZOX:[id number]    PATIENT STRESSORS: Medication change or noncompliance     PATIENT STRENGTHS: Capable of independent living  Motivation for treatment/growth  Supportive family/friends    PATIENT IDENTIFIED PROBLEMS:   Medication noncompliance  psychosis                 DISCHARGE CRITERIA:  Improved stabilization in mood, thinking, and/or behavior Safe-care adequate arrangements made Verbal commitment to aftercare and medication compliance  PRELIMINARY DISCHARGE PLAN: Attend 12-step recovery group Outpatient therapy Return to previous living arrangement  PATIENT/FAMILY INVOLVEMENT: This treatment plan has been presented to and reviewed with the patient, [name],  The patient and family have been given the opportunity to ask questions and make suggestions.  Earnest Conroy, RN 12/08/2021, 12:42 AM
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
## 2021-12-08 NOTE — BHH Counselor (Signed)
Adult Comprehensive Assessment  Patient ID: [name], male   DOB: [birth year], 46 y.o.   MRN: [id number]  Information Source: Information source: Patient  Current Stressors:  Patient states their primary concerns and needs for treatment are:: "I couldn't get rest in [place]." Patient states their goals for this hospitilization and ongoing recovery are:: "I want to be able to pay for my insurance when I get back." Educational / Learning stressors: Denies stressors Employment / Job issues: On disability Family Relationships: "[name] throws me in the hospital too much."  States he is thinking about changing his last name because too many people think he is associated with trouble because of his last name.  Says he will get married and take his wife's name. Financial / Lack of resources (include bankruptcy): Denies stressors Housing / Lack of housing: Denies stressors, has friends at his hotel, but also states he would like to move to [place] and live near the [employer] so he can go there and eat lunch. Physical health (include injuries & life threatening diseases): "Only growing pains" in legs Social relationships: Denies stressors Substance abuse: Denies stressors Bereavement / Loss: Denies stressors  Living/Environment/Situation:  Living Arrangements: Alone Living conditions (as described by patient or guardian): Hotel on the 3rd floor Who else lives in the home?: Alone How James has patient lived in current situation?: 2-3 years What is atmosphere in current home: Dangerous (Dangerous at night)  Family History:  Marital status: Single Are you sexually active?: No What is your sexual orientation?: Heterosexual Has your sexual activity been affected by drugs, alcohol, medication, or emotional stress?: No Does patient have children?: No  Childhood History:  By whom was/is the patient raised?: Both parents Additional childhood history information: Parents divorced when  pt was in 4th grade.   Description of patient's relationship with caregiver when they were a child: UTA Patient's description of current relationship with people who raised him/her: Mother - "not good"; Father - "okay yesterday" How were you disciplined when you got in trouble as a child/adolescent?: UTA Does patient have siblings?: Yes Number of Siblings: 3 Description of patient's current relationship with siblings: 1 brother died and patient states he refuses to say his name now Did patient suffer any verbal/emotional/physical/sexual abuse as a child?: No Did patient suffer from severe childhood neglect?: No Has patient ever been sexually abused/assaulted/raped as an adolescent or adult?: No Was the patient ever a victim of a crime or a disaster?: No Witnessed domestic violence?: No Has patient been affected by domestic violence as an adult?: No  Education:  Highest grade of school patient has completed: Some college Currently a Consulting civil engineer?: No Learning disability?: No  Employment/Work Situation:   Employment Situation: On disability Why is Patient on Disability: Schizophrenia  How James has Patient Been on Disability: Per chart, since 1998 Patient's Job has Been Impacted by Current Illness: No What is the Longest Time Patient has Held a Job?: 1-1/2 year Where was the Patient Employed at that Time?: [employer]  Has Patient ever Been in the [employer]?: No  Financial Resources:   Surveyor, quantity resources: Insurance claims handler, Medicare Does patient have a Lawyer or guardian?:  Industrial/product designer - previously said ihis payee was Animator in [place])  Alcohol/Substance Abuse:   What has been your use of drugs/alcohol within the last 12 months?: Denies all substance use, states he has not drank in a James time Alcohol/Substance Abuse Treatment Hx: Denies past history Has alcohol/substance abuse ever caused legal  problems?: No  Social Support System:   Forensic psychologist  System: Poor Describe Community Support System: 2 friends named [name] and [name] who also live in the hotel.  Talks about the names of other friends living in the hotel.  Does not talk about mom and dad or brothers being supportive. Type of faith/religion: "I believe in God." How does patient's faith help to cope with current illness?: "Helps me a lot"  Leisure/Recreation:   Do You Have Hobbies?: Yes Leisure and Hobbies: Video games, writing movie scripts, writing music  Strengths/Needs:   What is the patient's perception of their strengths?: "I can do just about anything." Patient states they can use these personal strengths during their treatment to contribute to their recovery: UTA Patient states these barriers may affect/interfere with their treatment: N/A Patient states these barriers may affect their return to the community: N/A Other important information patient would like considered in planning for their treatment: N/A  Discharge Plan:   Currently receiving community mental health services: Yes (From Whom) ([facility]) Patient states concerns and preferences for aftercare planning are: Stay with current services from [facility] in [place] Patient states they will know when they are safe and ready for discharge when: UTA Does patient have access to transportation?: Yes Does patient have financial barriers related to discharge medications?: No Will patient be returning to same living situation after discharge?: Yes  Summary/Recommendations:   Summary and Recommendations (to be completed by the evaluator): Patient is a 46yo male who is admitted under IVC with psychotic symptoms, brought in by law enforcement.  He reported both paranoid and grandiose delusions and was disorganized with flight of ideas.  He lives alone in a hotel in [place], has friends at that same place, but also states he wants to move to [place] near [place] mall so that he can go there to eat.   He is supposed to be receiving ACTT services from [facility].  He has been on disability since 1998, reports that he does not drink or use any substances.  He talks about [facility] hospitalizations [facility].  It is noted in the chart that he has been to the emergency room 13 times since June 2023.  Patient would benefit from group therapy, medication management, psychoeducation, crisis stabilization, peer support and discharge planning.  At discharge it is recommended that the patient adhere to the established aftercare plan.  [name]. 12/08/2021

## 2021-12-09 DIAGNOSIS — F29 Unspecified psychosis not due to a substance or known physiological condition: Secondary | ICD-10-CM | POA: Diagnosis not present

## 2021-12-09 LAB — AMMONIA: Ammonia: 45 umol/L — ABNORMAL HIGH (ref 9–35)

## 2021-12-09 LAB — VALPROIC ACID LEVEL: Valproic Acid Lvl: 63 ug/mL (ref 50.0–100.0)

## 2021-12-09 LAB — GLUCOSE, CAPILLARY: Glucose-Capillary: 91 mg/dL (ref 70–99)

## 2021-12-09 MED ORDER — SALINE SPRAY 0.65 % NA SOLN
1.0000 | NASAL | Status: DC | PRN
  Administered 2021-12-09 – 2021-12-25 (×8): 1 via NASAL
  Filled 2021-12-09: qty 44

## 2021-12-09 NOTE — BHH Group Notes (Signed)
Pt. Did not attend wrap up group.

## 2021-12-09 NOTE — BHH Group Notes (Signed)
PsychoEducational Group Note. Patients were given interactivity with mental health comparison to a car maintenance.  Pts were given a toy car and asked Patients were asked to explore what helps a car run, using fuel to help car run, as compared to how we need sleep, appropriate supports, healthy food and coping skills to support mental health maintenance. Pts were asked to identify one healthy coping mechanism that helps support their mental health. Pt attended and was appropriate. 

## 2021-12-09 NOTE — Progress Notes (Signed)
EKG completed and placed in front of chart. MD aware.   Normal Sinus  Vent rate 99/PR 124/QRS 76/ QT312/400 / PRT 37/48

## 2021-12-09 NOTE — Progress Notes (Signed)
RN was in the process of obtaining CBG for lunch. Pt wanted to prick himself but did not get a good sample. RN attempted to prick him again and Pt got increasingly irritable and angry. Pt stormed out of room and was talking to self loudly. Insulin will be held due to no CBG reading. MD informed. Will try to attempt again if Pt allows it once he calms down.

## 2021-12-09 NOTE — Group Note (Signed)
LCSW Group Therapy Note  12/09/2021      Type of Therapy and Topic:  Group Therapy: Gratitude   Description:   Group was not held due to acuity on the unit and the overall inability of patients to program in a productive manner.  A handout was given to each patient, with the following information:   Gratitude  "Acknowledging the good that you already have in your life is the foundation for all abundance." - Eckhart Tolle  " 'Enough' is a feast." - Buddhist Proverb  "Gratitude sweetens even the smallest moments."  "It is not joy that makes us grateful; It is gratitude that makes us joyful." - David Steindl-Rast    Put at least one response under each category of something for which you are grateful:  People:  Experiences:  Things:  Places:  Skills:  Other:  Add more responses as you get ideas from other people.   Therapeutic Modalities:   Activity  Gyneth Hubka J Grossman-Orr, LCSW .  

## 2021-12-09 NOTE — BHH Group Notes (Signed)
Adult Psychoeducational Group Note Date:  12/09/2021 Time:  0900-1045 Group Topic/Focus: PROGRESSIVE RELAXATION. A group where deep breathing is taught and tensing and relaxation muscle groups is used. Imagery is used as well.  Pts are asked to imagine 3 pillars that hold them up when they are not able to hold themselves up and to share that with the group.  Participation Level:  Pt came in the room and just started talking. Intrusive with the other patients. Flight of ideas. Then walked out the door  [name] A

## 2021-12-09 NOTE — Progress Notes (Addendum)
[facility] MD Progress Note  12/09/2021 8:45 AM [name]  MRN:  [id number] Subjective:  [name] is a 46 year old male with a reported past psychiatric history of schizophrenia-paranoid, bipolar disorder housing insecurity who presented to [facility] ED on 8/3 under IVC for psychosis and bizarre behaviors.    Yesterday's Recommendations per Psychiatry Team: - Restart home Zyprexa 10 mg twice daily - Continue Depakote ER 750 mg twice daily             -Will check VPA level tomorrow a.m. -Continue Cogentin 0.5 mg twice daily  On Evaluation Today: Patient is seen on the unit, and remains tangential with pressured speech, difficult to interrupt.  He reports sleeping approximately 10 hours throughout the day yesterday, not feeling excessively somnolent or lethargic today.  He reports intact appetite, and denies somatic concerns or complaints.  He reports that he plans to talk with his cousin and aunt today, and that we should record all phone conversations on the unit so that we and his dad know his mom is not lying about him.  When advised that it would not be legal to record nor distribute phone calls, he becomes mildly agitated, stating that this writer is threatening him.  He quickly calms down when reassured otherwise.  He denies SI, HI, AVH today.  He denies adverse effects of his medications.   Principal Problem: Schizoaffective disorder, bipolar type ([place]) Diagnosis: Principal Problem:   Schizoaffective disorder, bipolar type ([place]) Active Problems:   Tobacco use disorder   Total Time spent with patient: 20 minutes  Past Psychiatric History: See H&P  Past Medical History:  Past Medical History:  Diagnosis Date   Bipolar disorder ([place])    Bronchitis    Hyperlipidemia    Paranoid schizophrenia ([place])    Schizophrenia ([place])     Past Surgical History:  Procedure Laterality Date   TESTICLE IMPLANTATION TO THIGH     TESTICLE SURGERY     Family History:  Family History  Problem  Relation Age of Onset   Schizophrenia Neg Hx    Family Psychiatric  History: See H&P Social History:  Social History   Substance and Sexual Activity  Alcohol Use Not Currently   Comment: weekly     Social History   Substance and Sexual Activity  Drug Use Yes   Types: Marijuana   Comment: smokes hemp    Social History   Socioeconomic History   Marital status: Single    Spouse name: Not on file   Number of children: Not on file   Years of education: 12 years   Highest education level: 12th grade  Occupational History   Occupation: On disability  Tobacco Use   Smoking status: Every Day    Packs/day: 1.00    Types: Cigarettes   Smokeless tobacco: Never  Vaping Use   Vaping Use: Never used  Substance and Sexual Activity   Alcohol use: Not Currently    Comment: weekly   Drug use: Yes    Types: Marijuana    Comment: smokes hemp   Sexual activity: Yes    Birth control/protection: Condom  Other Topics Concern   Not on file  Social History Narrative   Pt lives alone in apartment complex for mentally ill   Social Determinants of Health   Financial Resource Strain: Not on file  Food Insecurity: Not on file  Transportation Needs: Not on file  Physical Activity: Not on file  Stress: Not on file  Social Connections:  Not on file   Additional Social History:       Sleep: Good  Appetite:  Good  Current Medications: Current Facility-Administered Medications  Medication Dose Route Frequency Provider Last Rate Last Admin   alum & mag hydroxide-simeth (MAALOX/MYLANTA) 200-200-20 MG/5ML suspension 30 mL  30 mL Oral Q4H PRN Bobbitt, Shalon E, NP       atorvastatin (LIPITOR) tablet 40 mg  40 mg Oral QHS Bobbitt, Shalon E, NP   40 mg at 12/08/21 2036   benztropine (COGENTIN) tablet 0.5 mg  0.5 mg Oral BID Bobbitt, Shalon E, NP   0.5 mg at 12/09/21 0746   divalproex (DEPAKOTE) DR tablet 750 mg  750 mg Oral Q12H Bobbitt, Shalon E, NP   750 mg at 12/09/21 0746    empagliflozin (JARDIANCE) tablet 10 mg  10 mg Oral Daily Rosezetta Schlatter, MD   10 mg at 12/09/21 0747   hydrOXYzine (ATARAX) tablet 25 mg  25 mg Oral TID PRN Bobbitt, Shalon E, NP   25 mg at 12/08/21 0225   ibuprofen (ADVIL) tablet 800 mg  800 mg Oral BID PRN Bobbitt, Shalon E, NP   800 mg at 12/07/21 2327   insulin aspart (novoLOG) injection 0-15 Units  0-15 Units Subcutaneous TID WC Rosezetta Schlatter, MD       insulin aspart (novoLOG) injection 0-5 Units  0-5 Units Subcutaneous QHS Rosezetta Schlatter, MD       OLANZapine zydis (ZYPREXA) disintegrating tablet 10 mg  10 mg Oral Q8H PRN Bobbitt, Shalon E, NP       And   LORazepam (ATIVAN) tablet 1 mg  1 mg Oral PRN Bobbitt, Shalon E, NP       And   ziprasidone (GEODON) injection 20 mg  20 mg Intramuscular PRN Bobbitt, Shalon E, NP       magnesium hydroxide (MILK OF MAGNESIA) suspension 30 mL  30 mL Oral Daily PRN Bobbitt, Shalon E, NP   30 mL at 12/08/21 1410   metFORMIN (GLUCOPHAGE) tablet 1,000 mg  1,000 mg Oral BID WC Bobbitt, Shalon E, NP   1,000 mg at 12/09/21 0746   nicotine (NICODERM CQ - dosed in mg/24 hours) patch 21 mg  21 mg Transdermal Daily Rosezetta Schlatter, MD   21 mg at 12/09/21 0747   OLANZapine (ZYPREXA) tablet 10 mg  10 mg Oral BID Rosezetta Schlatter, MD   10 mg at 12/09/21 0747   traZODone (DESYREL) tablet 50 mg  50 mg Oral QHS PRN Bobbitt, Shalon E, NP   50 mg at 12/08/21 0225    Lab Results:  Results for orders placed or performed during the hospital encounter of 12/07/21 (from the past 48 hour(s))  CBC     Status: Abnormal   Collection Time: 12/08/21  6:25 PM  Result Value Ref Range   WBC 9.1 4.0 - 10.5 K/uL   RBC 4.33 4.22 - 5.81 MIL/uL   Hemoglobin 12.9 (L) 13.0 - 17.0 g/dL   HCT 39.3 39.0 - 52.0 %   MCV 90.8 80.0 - 100.0 fL   MCH 29.8 26.0 - 34.0 pg   MCHC 32.8 30.0 - 36.0 g/dL   RDW 12.2 11.5 - 15.5 %   Platelets 364 150 - 400 K/uL   nRBC 0.0 0.0 - 0.2 %    Comment: Performed at [facility], [place]  [address] [place], [place] [address]  Comprehensive metabolic panel     Status: None   Collection Time: 12/08/21  6:25 PM  Result Value Ref Range   Sodium 143 135 - 145 mmol/L   Potassium 3.7 3.5 - 5.1 mmol/L   Chloride 104 98 - 111 mmol/L   CO2 30 22 - 32 mmol/L   Glucose, Bld 91 70 - 99 mg/dL    Comment: Glucose reference range applies only to samples taken after fasting for at least 8 hours.   BUN 9 6 - 20 mg/dL   Creatinine, Ser 0.99 0.61 - 1.24 mg/dL   Calcium 9.3 8.9 - 10.3 mg/dL   Total Protein 7.1 6.5 - 8.1 g/dL   Albumin 3.9 3.5 - 5.0 g/dL   AST 18 15 - 41 U/L   ALT 21 0 - 44 U/L   Alkaline Phosphatase 61 38 - 126 U/L   Total Bilirubin 0.4 0.3 - 1.2 mg/dL   GFR, Estimated >60 >60 mL/min    Comment: (NOTE) Calculated using the CKD-EPI Creatinine Equation (2021)    Anion gap 9 5 - 15    Comment: Performed at [facility], [place] [address] [place], [place] [address]  Glucose, capillary     Status: Abnormal   Collection Time: 12/08/21  8:50 PM  Result Value Ref Range   Glucose-Capillary 127 (H) 70 - 99 mg/dL    Comment: Glucose reference range applies only to samples taken after fasting for at least 8 hours.  Glucose, capillary     Status: None   Collection Time: 12/09/21  5:18 AM  Result Value Ref Range   Glucose-Capillary 91 70 - 99 mg/dL    Comment: Glucose reference range applies only to samples taken after fasting for at least 8 hours.  Valproic acid level     Status: None   Collection Time: 12/09/21  6:30 AM  Result Value Ref Range   Valproic Acid Lvl 63 50.0 - 100.0 ug/mL    Comment: Performed at [facility], [place] [address], [place], [place] [address]    Blood Alcohol level:  Lab Results  Component Value Date   ETH <10 11/28/2021   ETH <10 75/88/3254    Metabolic Disorder Labs: Lab Results  Component Value Date   HGBA1C 6.9 (H) 11/17/2021   MPG 151.33 11/17/2021   MPG 111.15 02/03/2020   Lab Results  Component  Value Date   PROLACTIN 8.1 06/01/2019   Lab Results  Component Value Date   CHOL 130 12/01/2021   TRIG 172 (H) 12/01/2021   HDL 28 (L) 12/01/2021   CHOLHDL 4.6 12/01/2021   VLDL 34 12/01/2021   LDLCALC 68 12/01/2021   LDLCALC 97 02/03/2020    Physical Findings:  Musculoskeletal: Strength & Muscle Tone: within normal limits Gait & Station: normal Patient leans: N/A  Psychiatric Specialty Exam:  Presentation  General Appearance: Casual   Eye Contact:Good   Speech:Clear and Coherent; Pressured   Speech Volume:Normal   Handedness:Right    Mood and Affect  Mood:Euthymic   Affect:Blunt    Thought Process  Thought Processes:Disorganized   Descriptions of Associations:Tangential   Orientation:Partial   Thought Content:Illogical; Paranoid Ideation; Perseveration; Tangential   History of Schizophrenia/Schizoaffective disorder:Yes   Duration of Psychotic Symptoms:Greater than six months   Hallucinations:Hallucinations: Auditory Description of Auditory Hallucinations: Hearing the president and God talk to him through the TV  Ideas of Reference:Delusions; Paranoia   Suicidal Thoughts:Suicidal Thoughts: No  Homicidal Thoughts:Homicidal Thoughts: No   Sensorium  Memory:Immediate Fair; Recent Fair   Judgment:Impaired   Insight:Poor    Executive Functions  Concentration:Fair  Attention Span:Fair   [place]    Psychomotor Activity  Psychomotor Activity:Psychomotor Activity: Normal   Assets  Assets:Communication Skills; Resilience; Social Support    Sleep  Sleep:Sleep: Fair    Physical Exam: Physical Exam Vitals reviewed.  Constitutional:      General: He is not in acute distress.    Appearance: He is not toxic-appearing.  HENT:     Head: Normocephalic and atraumatic.     Mouth/Throat:     Mouth: Mucous membranes are moist.     Pharynx: Oropharynx is clear.   Pulmonary:     Effort: Pulmonary effort is normal.  Skin:    General: Skin is warm and dry.  Neurological:     Mental Status: He is alert.     Motor: No weakness.     Gait: Gait normal.    Review of Systems  Cardiovascular:  Negative for chest pain.  Gastrointestinal: Negative.   Genitourinary: Negative.   Musculoskeletal: Negative.   Neurological:  Negative for dizziness, tremors and headaches.   Blood pressure 102/78, pulse (!) 111, temperature 99.5 F (37.5 C), temperature source Oral, resp. rate 16, height '6\' 1"'  (1.854 m), weight 117.9 kg, SpO2 100 %. Body mass index is 34.3 kg/m.   Treatment Plan Summary: ASSESSMENT: Principal Problem:   Schizoaffective disorder, bipolar type ([place]) Active Problems:   Tobacco use disorder     PLAN: Safety and Monitoring:             -- (In)Voluntary admission to inpatient psychiatric unit for safety, stabilization and treatment             -- Daily contact with patient to assess and evaluate symptoms and progress in treatment             -- Patient's case to be discussed in multi-disciplinary team meeting             -- Observation Level : q15 minute checks             -- Vital signs:  q12 hours             -- Precautions: suicide, elopement, and assault   2. Psychiatric Diagnoses and Treatment:  # Schizoaffective disorder-bipolar type - Continue home Zyprexa 10 mg twice daily. Will assess. If patient does not improve, will consider starting clozaril, as patient will have failed Haldol, invega, abilify, Prolixin, and Zyprexa -- Patient currently on Abilify Maintena 400 mg LAI, last kit filled 7/18. - Continue Depakote ER 750 mg twice daily             -VPA level 63 (prev 65), LFTs WNL, Plt 364 -Continue Cogentin 0.5 mg twice daily   PRN: Agitation protocol with Zyprexa 10 mg, Geodon 20 mg, and Ativan 1 mg   -- The risks/benefits/side-effects/alternatives to this medication were discussed in detail with the patient and time was  given for questions. The patient consents to medication trial.              -- Metabolic profile and EKG monitoring obtained while on an atypical antipsychotic  BMI: 34.3 Lipid Panel: TG 172 HbgA1c: 6.9% QTc:              -- Encouraged patient to participate in unit milieu and in scheduled group therapies      3. Medical Issues Being Addressed: #Nicotine use disorder - Nicotine patch 21 mg daily   #T2DM 24 hour CBG 91-127. Home medication  Synjardi 01/999 mg BID.  Due to the fact that patient is actively psychotic, will discontinue 4 times daily CBG monitoring, and only complete a.m. checks.  Patient on home regimen and diabetes well-controlled (A1c 6.9%), so minimal concern for decompensation. -Metformin 1000 mg twice daily with meals - Jardiance 10 mg every morning - A.m. CBG monitoring only.  Will discontinue SSI.   #Hyperlipidemia - Continue home atorvastatin 40 mg daily  4. Discharge Planning:               -- Social work and case management to assist with discharge planning and identification of hospital follow-up needs prior to discharge             -- Estimated LOS: 5-7 days             -- Discharge Concerns: Need to establish a safety plan; Medication compliance and effectiveness             -- Discharge Goals: Return home with outpatient referrals for mental health follow-up including medication management/psychotherapy   Rosezetta Schlatter, MD 12/09/2021, 8:45 AM

## 2021-12-09 NOTE — Progress Notes (Signed)
   12/09/21 2020  Psych Admission Type (Psych Patients Only)  Admission Status Involuntary  Psychosocial Assessment  Patient Complaints Anxiety;Restlessness  Eye Contact Fair  Facial Expression Animated  Affect Appropriate to circumstance  Child psychotherapist Assertive  Appearance/Hygiene Disheveled  Behavior Characteristics Cooperative;Appropriate to situation;Anxious;Fidgety;Guarded;Pacing  Mood Preoccupied;Pleasant  Thought Chartered certified accountant of ideas;Disorganized;Tangential  Content Delusions  Delusions Grandeur  Perception Hallucinations  Hallucination Auditory;Visual  Judgment Limited  Confusion Mild  Danger to Self  Current suicidal ideation? Denies

## 2021-12-09 NOTE — Progress Notes (Addendum)
Pt states he slept good last night. Pt remains delusional and grandiose with flight of ideas and tangential in thought. 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 Pt denies SI/HI/AVH. Pt is pleasant with interaction. Pt observed asking for refills of water container X 2. Will obtained EKG once group is done. Temp is mildly elevated-MD is aware-will recheck and monitor for abnormal s/s. Pt remains safe.

## 2021-12-10 ENCOUNTER — Ambulatory Visit: Payer: Medicare Other | Admitting: Orthopedic Surgery

## 2021-12-10 ENCOUNTER — Encounter (HOSPITAL_COMMUNITY): Payer: Self-pay

## 2021-12-10 DIAGNOSIS — F17200 Nicotine dependence, unspecified, uncomplicated: Secondary | ICD-10-CM | POA: Diagnosis not present

## 2021-12-10 DIAGNOSIS — E119 Type 2 diabetes mellitus without complications: Secondary | ICD-10-CM

## 2021-12-10 DIAGNOSIS — F250 Schizoaffective disorder, bipolar type: Secondary | ICD-10-CM | POA: Diagnosis not present

## 2021-12-10 LAB — GLUCOSE, CAPILLARY: Glucose-Capillary: 101 mg/dL — ABNORMAL HIGH (ref 70–99)

## 2021-12-10 MED ORDER — ARIPIPRAZOLE 10 MG PO TABS
10.0000 mg | ORAL_TABLET | Freq: Every day | ORAL | Status: AC
  Administered 2021-12-10: 10 mg via ORAL
  Filled 2021-12-10 (×2): qty 1

## 2021-12-10 MED ORDER — ARIPIPRAZOLE 15 MG PO TABS
15.0000 mg | ORAL_TABLET | Freq: Every day | ORAL | Status: DC
  Administered 2021-12-11 – 2021-12-12 (×2): 15 mg via ORAL
  Filled 2021-12-10 (×3): qty 1

## 2021-12-10 MED ORDER — MELATONIN 5 MG PO TABS
5.0000 mg | ORAL_TABLET | Freq: Once | ORAL | Status: AC
  Administered 2021-12-10: 5 mg via ORAL
  Filled 2021-12-10 (×2): qty 1

## 2021-12-10 NOTE — Progress Notes (Signed)
Adult Psychoeducational Group Note  Date:  12/10/2021 Time:  9:30 PM  Group Topic/Focus:  Wrap-Up Group:   The focus of this group is to help patients review their daily goal of treatment and discuss progress on daily workbooks.  Participation Level:  Active  Participation Quality:  Appropriate  Affect:  Appropriate  Cognitive:  Disorganized and Confused  Insight: Limited  Engagement in Group:  Limited and Off Topic  Modes of Intervention:  Discussion  Additional Comments:  Pt stated his goal for today was to focus on his treatment plan. Pt stated he accomplished his goal today. Pt stated he talked with his doctor and social worker about his care today. Pt rated his overall day a 10. Pt stated he was able to contact his aunt and is father today. Pt stated he felt better about himself today. Pt stated he was able to attend all meals. Pt stated he took all medications provided today. Pt stated he attend all groups held today. Pt stated his appetite was pretty good today. Pt rated sleep last night was pretty good. Pt stated the goal tonight was to get some rest. Pt stated he had no physical pain tonight. Pt deny visual hallucinations and auditory issues tonight. Pt denies thoughts of harming himself or others. Pt stated he would alert staff if anything changed  Felipa Furnace 12/10/2021, 9:30 PM

## 2021-12-10 NOTE — Progress Notes (Signed)
   12/10/21 2100  Psych Admission Type (Psych Patients Only)  Admission Status Involuntary  Psychosocial Assessment  Patient Complaints Anxiety;Restlessness  Eye Contact Fair  Facial Expression Animated  Affect Anxious;Appropriate to circumstance  Speech Tangential  Interaction Assertive  Motor Activity Pacing  Appearance/Hygiene Disheveled  Behavior Characteristics Cooperative  Mood Preoccupied;Pleasant  Aggressive Behavior  Effect No apparent injury  Thought Process  Coherency Disorganized  Content Delusions  Delusions Paranoid  Perception Hallucinations  Hallucination Auditory;Visual  Judgment Limited  Confusion None  Danger to Self  Current suicidal ideation? Denies  Danger to Others  Danger to Others None reported or observed

## 2021-12-10 NOTE — [facility] Suicide Risk Assessment (Signed)
[facility] INPATIENT:  Family/Significant Other Suicide Prevention Education  Suicide Prevention Education:  Education Completed; [name], father,  [phone] (name of family member/significant other) has been identified by the patient as the family member/significant other with whom the patient will be residing, and identified as the person(s) who will aid the patient in the event of a mental health crisis (suicidal ideations/suicide attempt).  With written consent from the patient, the family member/significant other has been provided the following suicide prevention education, prior to the and/or following the discharge of the patient.  CSW spoke with patient father who reports that he was acting bizarre and responding to internal stimuli.  Father states that he was just released from [facility] in [place] and feels he was no better from when he was released.  Father states that patient lives alone and is only housed sometimes with either him or his mom.  Father reports mom may know additional information.  Father does not believe patient has access to guns and weapons.  He reports recently that he has had mood swings and acting impulsively which makes him weary about safety concerns.    The suicide prevention education provided includes the following: Suicide risk factors Suicide prevention and interventions National Suicide Hotline telephone number [facility] assessment telephone number [facility] Emergency Assistance 911 [facility] and/or Residential Mobile Crisis Unit telephone number  Request made of family/significant other to: Remove weapons (e.g., guns, rifles, knives), all items previously/currently identified as safety concern.   Remove drugs/medications (over-the-counter, prescriptions, illicit drugs), all items previously/currently identified as a safety concern.  The family member/significant other verbalizes understanding of the suicide prevention education  information provided.  The family member/significant other agrees to remove the items of safety concern listed above.  Corinn Stoltzfus E [name] 12/10/2021, 3:55 PM

## 2021-12-10 NOTE — [facility] IP Treatment Plan (Signed)
Interdisciplinary Treatment and Diagnostic Plan Update  12/10/2021 Time of Session: 0830 [name] MRN: [id number]  Principal Diagnosis: Schizoaffective disorder, bipolar type [facility])  Secondary Diagnoses: Principal Problem:   Schizoaffective disorder, bipolar type (HCC) Active Problems:   Tobacco use disorder   Current Medications:  Current Facility-Administered Medications  Medication Dose Route Frequency Provider Last Rate Last Admin   alum & mag hydroxide-simeth (MAALOX/MYLANTA) 200-200-20 MG/5ML suspension 30 mL  30 mL Oral Q4H PRN Bobbitt, Shalon E, NP       atorvastatin (LIPITOR) tablet 40 mg  40 mg Oral QHS Bobbitt, Shalon E, NP   40 mg at 12/09/21 2020   benztropine (COGENTIN) tablet 0.5 mg  0.5 mg Oral BID Bobbitt, Shalon E, NP   0.5 mg at 12/10/21 0805   divalproex (DEPAKOTE) DR tablet 750 mg  750 mg Oral Q12H Bobbitt, Shalon E, NP   750 mg at 12/10/21 0805   empagliflozin (JARDIANCE) tablet 10 mg  10 mg Oral Daily Lamar Sprinkles, MD   10 mg at 12/10/21 0805   hydrOXYzine (ATARAX) tablet 25 mg  25 mg Oral TID PRN Bobbitt, Shalon E, NP   25 mg at 12/08/21 0225   ibuprofen (ADVIL) tablet 800 mg  800 mg Oral BID PRN Bobbitt, Shalon E, NP   800 mg at 12/10/21 0624   OLANZapine zydis (ZYPREXA) disintegrating tablet 10 mg  10 mg Oral Q8H PRN Bobbitt, Shalon E, NP       And   LORazepam (ATIVAN) tablet 1 mg  1 mg Oral PRN Bobbitt, Shalon E, NP       And   ziprasidone (GEODON) injection 20 mg  20 mg Intramuscular PRN Bobbitt, Shalon E, NP       magnesium hydroxide (MILK OF MAGNESIA) suspension 30 mL  30 mL Oral Daily PRN Bobbitt, Shalon E, NP   30 mL at 12/08/21 1410   metFORMIN (GLUCOPHAGE) tablet 1,000 mg  1,000 mg Oral BID WC Bobbitt, Shalon E, NP   1,000 mg at 12/10/21 0805   nicotine (NICODERM CQ - dosed in mg/24 hours) patch 21 mg  21 mg Transdermal Daily Lamar Sprinkles, MD   21 mg at 12/09/21 0747   OLANZapine (ZYPREXA) tablet 10 mg  10 mg Oral BID Lamar Sprinkles, MD    10 mg at 12/10/21 0805   sodium chloride (OCEAN) 0.65 % nasal spray 1 spray  1 spray Each Nare PRN Lamar Sprinkles, MD   1 spray at 12/10/21 0805   traZODone (DESYREL) tablet 50 mg  50 mg Oral QHS PRN Bobbitt, Shalon E, NP   50 mg at 12/09/21 2020   PTA Medications: Medications Prior to Admission  Medication Sig Dispense Refill Last Dose   ABILIFY MAINTENA 400 MG PRSY prefilled syringe Inject 400 mg into the muscle every 28 (twenty-eight) days.      atorvastatin (LIPITOR) 40 MG tablet Take 40 mg by mouth at bedtime.      ibuprofen (ADVIL) 800 MG tablet Take 800 mg by mouth 2 (two) times daily as needed for fever, mild pain or headache.      metFORMIN (GLUCOPHAGE) 500 MG tablet Take 2 tablets (1,000 mg total) by mouth 2 (two) times daily with a meal. 60 tablet 0    naproxen (NAPROSYN) 500 MG tablet Take 1 tablet (500 mg total) by mouth 2 (two) times daily. 10 tablet 0     Patient Stressors: Medication change or noncompliance    Patient Strengths: Capable of independent living  Motivation  for treatment/growth  Supportive family/friends   Treatment Modalities: Medication Management, Group therapy, Case management,  1 to 1 session with clinician, Psychoeducation, Recreational therapy.   Physician Treatment Plan for Primary Diagnosis: Schizoaffective disorder, bipolar type (HCC) Long Term Goal(s): Improvement in symptoms so as ready for discharge   Short Term Goals: Ability to identify changes in lifestyle to reduce recurrence of condition will improve Ability to verbalize feelings will improve Ability to demonstrate self-control will improve Ability to identify and develop effective coping behaviors will improve Compliance with prescribed medications will improve  Medication Management: Evaluate patient's response, side effects, and tolerance of medication regimen.  Therapeutic Interventions: 1 to 1 sessions, Unit Group sessions and Medication administration.  Evaluation of Outcomes:  Progressing  Physician Treatment Plan for Secondary Diagnosis: Principal Problem:   Schizoaffective disorder, bipolar type (HCC) Active Problems:   Tobacco use disorder  Long Term Goal(s): Improvement in symptoms so as ready for discharge   Short Term Goals: Ability to identify changes in lifestyle to reduce recurrence of condition will improve Ability to verbalize feelings will improve Ability to demonstrate self-control will improve Ability to identify and develop effective coping behaviors will improve Compliance with prescribed medications will improve     Medication Management: Evaluate patient's response, side effects, and tolerance of medication regimen.  Therapeutic Interventions: 1 to 1 sessions, Unit Group sessions and Medication administration.  Evaluation of Outcomes: Progressing   RN Treatment Plan for Primary Diagnosis: Schizoaffective disorder, bipolar type (HCC) Long Term Goal(s): Knowledge of disease and therapeutic regimen to maintain health will improve  Short Term Goals: Ability to remain free from injury will improve, Ability to verbalize frustration and anger appropriately will improve, Ability to demonstrate self-control, Ability to participate in decision making will improve, Ability to verbalize feelings will improve, Ability to disclose and discuss suicidal ideas, Ability to identify and develop effective coping behaviors will improve, and Compliance with prescribed medications will improve  Medication Management: RN will administer medications as ordered by provider, will assess and evaluate patient's response and provide education to patient for prescribed medication. RN will report any adverse and/or side effects to prescribing provider.  Therapeutic Interventions: 1 on 1 counseling sessions, Psychoeducation, Medication administration, Evaluate responses to treatment, Monitor vital signs and CBGs as ordered, Perform/monitor CIWA, COWS, AIMS and Fall Risk  screenings as ordered, Perform wound care treatments as ordered.  Evaluation of Outcomes: Progressing   LCSW Treatment Plan for Primary Diagnosis: Schizoaffective disorder, bipolar type (HCC) Long Term Goal(s): Safe transition to appropriate next level of care at discharge, Engage patient in therapeutic group addressing interpersonal concerns.  Short Term Goals: Engage patient in aftercare planning with referrals and resources, Increase social support, Increase ability to appropriately verbalize feelings, Increase emotional regulation, Facilitate acceptance of mental health diagnosis and concerns, Facilitate patient progression through stages of change regarding substance use diagnoses and concerns, Identify triggers associated with mental health/substance abuse issues, and Increase skills for wellness and recovery  Therapeutic Interventions: Assess for all discharge needs, 1 to 1 time with Social worker, Explore available resources and support systems, Assess for adequacy in community support network, Educate family and significant other(s) on suicide prevention, Complete Psychosocial Assessment, Interpersonal group therapy.  Evaluation of Outcomes: Progressing   Progress in Treatment: Attending groups: Yes. Participating in groups: Yes. Taking medication as prescribed: Yes. Toleration medication: Yes. Family/Significant other contact made: No, will contact:  CSW will obtain consent to reach collateral.  Patient understands diagnosis: Yes. Discussing patient identified problems/goals with staff:  Yes. Medical problems stabilized or resolved: Yes. Denies suicidal/homicidal ideation: No. Issues/concerns per patient self-inventory: Yes. Other: none  New problem(s) identified: No, Describe:  none  New Short Term/Long Term Goal(s): Patient to work towards elimination of symptoms of psychosis, medication management for mood stabilization; elimination of SI thoughts; development of  comprehensive mental wellness plan.  Patient Goals: Patient states their goal for treatment is to "make sure medications are the same."  Discharge Plan or Barriers: No psychosocial barriers identified at this time, patient to return to place of residence when appropriate for discharge.   Reason for Continuation of Hospitalization: Other; describe psychosis  Estimated Length of Stay: 1-7 days   Scribe for Treatment Team: Almedia Balls 12/10/2021 11:38 AM

## 2021-12-10 NOTE — Progress Notes (Signed)
Pt up pacing in room , pt requesting something else to help him sleep

## 2021-12-10 NOTE — Progress Notes (Addendum)
NP [place] put 1 x order for 5 mg Melatonin and was given along with 1 mg Ativan per [facility]

## 2021-12-10 NOTE — Progress Notes (Signed)
[facility] MD Progress Note  12/10/2021 2:51 PM [name]  MRN:  [id number] Subjective:  [name] is a 46 year old male with a reported past psychiatric history of schizophrenia-paranoid, bipolar disorder housing insecurity who presented to [facility] ED on 8/3 under IVC for psychosis and bizarre behaviors.    Yesterday's Recommendations per Psychiatry Team: - Continue home Zyprexa 10 mg twice daily - Continue Depakote ER 750 mg twice daily -Continue Cogentin 0.5 mg twice daily  On Evaluation Today: Patient is seen on the unit, this morning, and has white liquid on his head and dripping down his face.  When asked about this, he is appropriate, with boarding that he had lotion on his head, and someone told him it would help his hair grow.  On assessment, he is overall more appropriate and less pressured and tangential although still somewhat disorganized and difficult to interrupt.  He reports sleeping well last night. He reports intact appetite, and denies somatic concerns or complaints.  He reports that his dad plans to visit today, which he anticipates.  He denies SI, HI, AVH today.  He says that he has no longer been talking to anyone through the news or the TV. He denies adverse effects of his medications.  Collateral: [employer] [phone]): Left VM for [facility].    Principal Problem: Schizoaffective disorder, bipolar type ([place]) Diagnosis: Principal Problem:   Schizoaffective disorder, bipolar type ([place]) Active Problems:   Tobacco use disorder   Total Time spent with patient: 20 minutes  Past Psychiatric History: See H&P  Past Medical History:  Past Medical History:  Diagnosis Date   Bipolar disorder ([place])    Bronchitis    Hyperlipidemia    Paranoid schizophrenia ([place])    Schizophrenia ([place])     Past Surgical History:  Procedure Laterality Date   TESTICLE IMPLANTATION TO THIGH     TESTICLE SURGERY     Family History:  Family History  Problem Relation Age of  Onset   Schizophrenia Neg Hx    Family Psychiatric  History: See H&P Social History:  Social History   Substance and Sexual Activity  Alcohol Use Not Currently   Comment: weekly     Social History   Substance and Sexual Activity  Drug Use Yes   Types: Marijuana   Comment: smokes hemp    Social History   Socioeconomic History   Marital status: Single    Spouse name: Not on file   Number of children: Not on file   Years of education: 12 years   Highest education level: 12th grade  Occupational History   Occupation: On disability  Tobacco Use   Smoking status: Every Day    Packs/day: 1.00    Types: Cigarettes   Smokeless tobacco: Never  Vaping Use   Vaping Use: Never used  Substance and Sexual Activity   Alcohol use: Not Currently    Comment: weekly   Drug use: Yes    Types: Marijuana    Comment: smokes hemp   Sexual activity: Yes    Birth control/protection: Condom  Other Topics Concern   Not on file  Social History Narrative   Pt lives alone in apartment complex for mentally ill   Social Determinants of Health   Financial Resource Strain: Not on file  Food Insecurity: Not on file  Transportation Needs: Not on file  Physical Activity: Not on file  Stress: Not on file  Social Connections: Not on file   Additional Social History:  Sleep: Good  Appetite:  Good  Current Medications: Current Facility-Administered Medications  Medication Dose Route Frequency Provider Last Rate Last Admin   alum & mag hydroxide-simeth (MAALOX/MYLANTA) 200-200-20 MG/5ML suspension 30 mL  30 mL Oral Q4H PRN Bobbitt, Shalon E, NP       atorvastatin (LIPITOR) tablet 40 mg  40 mg Oral QHS Bobbitt, Shalon E, NP   40 mg at 12/09/21 2020   benztropine (COGENTIN) tablet 0.5 mg  0.5 mg Oral BID Bobbitt, Shalon E, NP   0.5 mg at 12/10/21 0805   divalproex (DEPAKOTE) DR tablet 750 mg  750 mg Oral Q12H Bobbitt, Shalon E, NP   750 mg at 12/10/21 0805   empagliflozin (JARDIANCE)  tablet 10 mg  10 mg Oral Daily Rosezetta Schlatter, MD   10 mg at 12/10/21 0805   hydrOXYzine (ATARAX) tablet 25 mg  25 mg Oral TID PRN Bobbitt, Shalon E, NP   25 mg at 12/08/21 0225   ibuprofen (ADVIL) tablet 800 mg  800 mg Oral BID PRN Bobbitt, Shalon E, NP   800 mg at 12/10/21 0624   OLANZapine zydis (ZYPREXA) disintegrating tablet 10 mg  10 mg Oral Q8H PRN Bobbitt, Shalon E, NP       And   LORazepam (ATIVAN) tablet 1 mg  1 mg Oral PRN Bobbitt, Shalon E, NP       And   ziprasidone (GEODON) injection 20 mg  20 mg Intramuscular PRN Bobbitt, Shalon E, NP       magnesium hydroxide (MILK OF MAGNESIA) suspension 30 mL  30 mL Oral Daily PRN Bobbitt, Shalon E, NP   30 mL at 12/08/21 1410   metFORMIN (GLUCOPHAGE) tablet 1,000 mg  1,000 mg Oral BID WC Bobbitt, Shalon E, NP   1,000 mg at 12/10/21 0805   nicotine (NICODERM CQ - dosed in mg/24 hours) patch 21 mg  21 mg Transdermal Daily Rosezetta Schlatter, MD   21 mg at 12/09/21 0747   OLANZapine (ZYPREXA) tablet 10 mg  10 mg Oral BID Rosezetta Schlatter, MD   10 mg at 12/10/21 0805   sodium chloride (OCEAN) 0.65 % nasal spray 1 spray  1 spray Each Nare PRN Rosezetta Schlatter, MD   1 spray at 12/10/21 0805   traZODone (DESYREL) tablet 50 mg  50 mg Oral QHS PRN Bobbitt, Shalon E, NP   50 mg at 12/09/21 2020    Lab Results:  Results for orders placed or performed during the hospital encounter of 12/07/21 (from the past 48 hour(s))  CBC     Status: Abnormal   Collection Time: 12/08/21  6:25 PM  Result Value Ref Range   WBC 9.1 4.0 - 10.5 K/uL   RBC 4.33 4.22 - 5.81 MIL/uL   Hemoglobin 12.9 (L) 13.0 - 17.0 g/dL   HCT 39.3 39.0 - 52.0 %   MCV 90.8 80.0 - 100.0 fL   MCH 29.8 26.0 - 34.0 pg   MCHC 32.8 30.0 - 36.0 g/dL   RDW 12.2 11.5 - 15.5 %   Platelets 364 150 - 400 K/uL   nRBC 0.0 0.0 - 0.2 %    Comment: Performed at [facility], [place] [address] [place], [place] [address]  Comprehensive metabolic panel     Status: None   Collection Time:  12/08/21  6:25 PM  Result Value Ref Range   Sodium 143 135 - 145 mmol/L   Potassium 3.7 3.5 - 5.1 mmol/L   Chloride 104 98 - 111 mmol/L  CO2 30 22 - 32 mmol/L   Glucose, Bld 91 70 - 99 mg/dL    Comment: Glucose reference range applies only to samples taken after fasting for at least 8 hours.   BUN 9 6 - 20 mg/dL   Creatinine, Ser 0.99 0.61 - 1.24 mg/dL   Calcium 9.3 8.9 - 10.3 mg/dL   Total Protein 7.1 6.5 - 8.1 g/dL   Albumin 3.9 3.5 - 5.0 g/dL   AST 18 15 - 41 U/L   ALT 21 0 - 44 U/L   Alkaline Phosphatase 61 38 - 126 U/L   Total Bilirubin 0.4 0.3 - 1.2 mg/dL   GFR, Estimated >60 >60 mL/min    Comment: (NOTE) Calculated using the CKD-EPI Creatinine Equation (2021)    Anion gap 9 5 - 15    Comment: Performed at [facility], [place] [address], [place], [place] [address]  Glucose, capillary     Status: Abnormal   Collection Time: 12/08/21  8:50 PM  Result Value Ref Range   Glucose-Capillary 127 (H) 70 - 99 mg/dL    Comment: Glucose reference range applies only to samples taken after fasting for at least 8 hours.  Glucose, capillary     Status: None   Collection Time: 12/09/21  5:18 AM  Result Value Ref Range   Glucose-Capillary 91 70 - 99 mg/dL    Comment: Glucose reference range applies only to samples taken after fasting for at least 8 hours.  Valproic acid level     Status: None   Collection Time: 12/09/21  6:30 AM  Result Value Ref Range   Valproic Acid Lvl 63 50.0 - 100.0 ug/mL    Comment: Performed at [facility], [place] [address], [place], [place] [address]  Ammonia     Status: Abnormal   Collection Time: 12/09/21  6:27 PM  Result Value Ref Range   Ammonia 45 (H) 9 - 35 umol/L    Comment: Performed at [facility], [place] [address] [place],  [address]  Glucose, capillary     Status: Abnormal   Collection Time: 12/10/21  5:33 AM  Result Value Ref Range   Glucose-Capillary 101 (H) 70 - 99 mg/dL    Comment:  Glucose reference range applies only to samples taken after fasting for at least 8 hours.    Blood Alcohol level:  Lab Results  Component Value Date   ETH <10 11/28/2021   ETH <10 54/00/8676    Metabolic Disorder Labs: Lab Results  Component Value Date   HGBA1C 6.9 (H) 11/17/2021   MPG 151.33 11/17/2021   MPG 111.15 02/03/2020   Lab Results  Component Value Date   PROLACTIN 8.1 06/01/2019   Lab Results  Component Value Date   CHOL 130 12/01/2021   TRIG 172 (H) 12/01/2021   HDL 28 (L) 12/01/2021   CHOLHDL 4.6 12/01/2021   VLDL 34 12/01/2021   LDLCALC 68 12/01/2021   LDLCALC 97 02/03/2020    Physical Findings:  Musculoskeletal: Strength & Muscle Tone: within normal limits Gait & Station: normal Patient leans: N/A  Psychiatric Specialty Exam:  Presentation  General Appearance: Bizarre; Appropriate for Environment (Appeared bizarre this morning with lotion on head running down his face, but later appeared appropriate)   Eye Contact:Fair   Speech:Clear and Coherent; Normal Rate   Speech Volume:Normal   Handedness:Right    Mood and Affect  Mood:Euthymic   Affect:Congruent    Thought Process  Thought Processes:Disorganized; Coherent (Much  less disorganized, more coherent and concrete)   Descriptions of Associations:Tangential   Orientation:Full (Time, Place and Person)   Thought Content:WDL; Tangential   History of Schizophrenia/Schizoaffective disorder:Yes   Duration of Psychotic Symptoms:Greater than six months   Hallucinations:Hallucinations: None   Ideas of Reference:None   Suicidal Thoughts:Suicidal Thoughts: No   Homicidal Thoughts:Homicidal Thoughts: No    Sensorium  Memory:Immediate Fair; Recent Fair   Judgment:Impaired   Insight:Fair; Shallow    Executive Functions  Concentration:Fair   Attention Span:Fair   [place]    Psychomotor Activity   Psychomotor Activity:No data recorded   Assets  Assets:Communication Skills; Resilience; Social Support    Sleep  Sleep:No data recorded    Physical Exam: Physical Exam Vitals reviewed.  Constitutional:      General: He is not in acute distress.    Appearance: He is not toxic-appearing.  HENT:     Head: Normocephalic and atraumatic.     Mouth/Throat:     Mouth: Mucous membranes are moist.     Pharynx: Oropharynx is clear.  Pulmonary:     Effort: Pulmonary effort is normal.  Skin:    General: Skin is warm and dry.  Neurological:     Mental Status: He is alert.     Motor: No weakness.     Gait: Gait normal.    Review of Systems  Cardiovascular:  Negative for chest pain.  Gastrointestinal: Negative.   Genitourinary: Negative.   Musculoskeletal: Negative.   Neurological:  Negative for dizziness, tremors and headaches.   Blood pressure 95/79, pulse (!) 112, temperature 98.9 F (37.2 C), temperature source Oral, resp. rate 16, height '6\' 1"'  (1.854 m), weight 117.9 kg, SpO2 98 %. Body mass index is 34.3 kg/m.   Treatment Plan Summary: ASSESSMENT: Principal Problem:   Schizoaffective disorder, bipolar type ([place]) Active Problems:   Tobacco use disorder   Patient appears to be improving on current regimen, which leads [place] to believe that although he picked up his medications on time, he has not been compliant with taking them.  We will continue to attempt to get in touch with ACT team to confirm whether he received his Abilify Maintena LAI that was picked up from the pharmacy on 7/18.   PLAN: Safety and Monitoring:             -- (In)Voluntary admission to inpatient psychiatric unit for safety, stabilization and treatment             -- Daily contact with patient to assess and evaluate symptoms and progress in treatment             -- Patient's case to be discussed in multi-disciplinary team meeting             -- Observation Level : q15 minute checks             --  Vital signs:  q12 hours             -- Precautions: suicide, elopement, and assault   2. Psychiatric Diagnoses and Treatment:  # Schizoaffective disorder-bipolar type - Continue home Zyprexa 10 mg twice daily. Will assess. If patient does not improve, will consider starting clozaril, as patient will have failed Haldol, invega, abilify, Prolixin, and Zyprexa -- Patient currently on Abilify Maintena 400 mg LAI, last kit filled 7/18.  Attempting to confirm with ACT team if patient received. - Continue Depakote ER 750 mg twice daily             -  VPA level 63 (prev 65), LFTs WNL, Plt 364 -Continue Cogentin 0.5 mg twice daily   PRN: Agitation protocol with Zyprexa 10 mg, Geodon 20 mg, and Ativan 1 mg   -- The risks/benefits/side-effects/alternatives to this medication were discussed in detail with the patient and time was given for questions. The patient consents to medication trial.              -- Metabolic profile and EKG monitoring obtained while on an atypical antipsychotic  BMI: 34.3 Lipid Panel: TG 172 HbgA1c: 6.9% QTc:              -- Encouraged patient to participate in unit milieu and in scheduled group therapies      3. Medical Issues Being Addressed: #Nicotine use disorder - Nicotine patch 21 mg daily   #T2DM A.m. CBG 101. Home medication Synjardi 01/999 mg BID.  Due to the fact that patient is actively psychotic, will discontinue 4 times daily CBG monitoring, and only complete a.m. checks.  Patient on home regimen and diabetes well-controlled (A1c 6.9%), so minimal concern for decompensation. -Metformin 1000 mg twice daily with meals - Jardiance 10 mg every morning - A.m. CBG monitoring only.  Will discontinue SSI.   #Hyperlipidemia - Continue home atorvastatin 40 mg daily  4. Discharge Planning:               -- Social work and case management to assist with discharge planning and identification of hospital follow-up needs prior to discharge             -- Estimated LOS:  5-7 days             -- Discharge Concerns: Need to establish a safety plan; Medication compliance and effectiveness             -- Discharge Goals: Return home with outpatient referrals for mental health follow-up including medication management/psychotherapy   Rosezetta Schlatter, MD 12/10/2021, 2:51 PM

## 2021-12-10 NOTE — Progress Notes (Signed)
Patient did not attend morning orientation group.  

## 2021-12-10 NOTE — Group Note (Signed)
Recreation Therapy Group Note   Group Topic:Coping Skills  Group Date: 12/10/2021 Start Time: 1000 End Time: 1030 Facilitators: Caroll Rancher, LRT,CTRS Location: [address] Dayroom   Goal Area(s) Addresses: Patient will define what a coping skill is. Patient will work with peer to create a list of healthy coping skills beginning with each letter of the alphabet. Patient will successfully identify positive coping skills they can use post d/c.  Patient will acknowledge benefit(s) of using learned coping skills post d/c.  Group Description: Coping A to Z. Patient asked to identify what a coping skill is and when they use them. Patients with Clinical research associate discussed healthy versus unhealthy coping skills. Next patients were given a blank worksheet titled "Coping Skills A-Z".  Patients were instructed to come up with at least one positive coping skill per letter of the alphabet.  Patients were given 15 minutes to brainstorm, before ideas were presented to the large group. Patients and LRT debriefed on the importance of coping skill selection based on situation and back-up plans when a skill tried is not effective. At the end of group, patients were given an handout of alphabetized strategies to keep for future reference.   Affect/Mood: Appropriate   Participation Level: Moderate   Participation Quality: Minimal Cues   Behavior: Appropriate   Speech/Thought Process: Distracted   Insight: Poor   Judgement: Poor   Modes of Intervention: Worksheet   Patient Response to Interventions:  Receptive   Education Outcome:  Acknowledges education and In group clarification offered    Clinical Observations/Individualized Feedback: Pt was able to define coping skills as something used when angry or to "keep you in line".  Pt stated rambling about catching a charge because someone started something with him.  Pt needed redirection to get back on track.  Pt also needed assistance in getting started on  assignment.  Pt didn't understand what the assignment entailed, even with assistance from LRT.  Pt came up with things like energy, pledge, wash, super and ransom.  Pt eventually left and did not return.     Plan: Continue to engage patient in RT group sessions 2-3x/week.   Caroll Rancher, LRT,CTRS 12/10/2021 11:54 AM

## 2021-12-10 NOTE — Group Note (Signed)
12/10/2021 1pm   Type of Therapy and Topic:  Group Therapy:  Who Am I?  Self Esteem, Self-Actualization and Understanding Self.    Participation Level:  Minimal  Description of Group:   In this group patients will be asked to explore values, beliefs, truths, and morals as they relate to personal self.  Patients will be guided to discuss their thoughts, feelings, and behaviors related to what they identify as important to their true self. Patients will process together how values, beliefs and truths are connected to specific choices patients make every day. Each patient will be challenged to identify changes that they are motivated to make in order to improve self-esteem and self-actualization. This group will be process-oriented, with patients participating in exploration of their own experiences, giving and receiving support, and processing challenge from other group members.   Therapeutic Goals: Patient will identify false beliefs that currently interfere with their self-esteem.  Patient will identify feelings, thought process, and behaviors related to self and will become aware of the uniqueness of themselves and of others.  Patient will be able to identify and verbalize values, morals, and beliefs as they relate to self. Patient will begin to learn how to build self-esteem/self-awareness by expressing what is important and unique to them personally.   Summary of Patient Progress: Patient participated appropriately in group.  He reports that a compliment he has received from his father was that his father was proud of him. Patient participated in discussion about what contributes to a healthy self esteem and was able to discuss values and beliefs associated with a self esteem.      Therapeutic Modalities:   Cognitive Behavioral Therapy Solution Focused Therapy Motivational Interviewing Brief Therapy   Gardiner Sleeper Cortana Vanderford, LCSW 12/10/2021 1:51 PM

## 2021-12-10 NOTE — Progress Notes (Signed)
   12/10/21 1100  Psych Admission Type (Psych Patients Only)  Admission Status Other (Comment)  Psychosocial Assessment  Patient Complaints Anxiety;Restlessness  Eye Contact Fair  Facial Expression Animated  Affect Appropriate to circumstance  Speech Tangential  Interaction Assertive  Motor Activity Pacing  Appearance/Hygiene In scrubs  Behavior Characteristics Cooperative  Mood Preoccupied;Pleasant  Thought Process  Coherency Disorganized  Content Delusions  Delusions Paranoid  Perception Hallucinations  Hallucination Auditory;Visual  Judgment Limited  Confusion None  Danger to Self  Current suicidal ideation? Denies  Danger to Others  Danger to Others None reported or observed

## 2021-12-11 ENCOUNTER — Encounter (HOSPITAL_COMMUNITY): Payer: Self-pay | Admitting: Psychiatry

## 2021-12-11 DIAGNOSIS — F250 Schizoaffective disorder, bipolar type: Secondary | ICD-10-CM | POA: Diagnosis not present

## 2021-12-11 DIAGNOSIS — F17200 Nicotine dependence, unspecified, uncomplicated: Secondary | ICD-10-CM | POA: Diagnosis not present

## 2021-12-11 DIAGNOSIS — E119 Type 2 diabetes mellitus without complications: Secondary | ICD-10-CM | POA: Diagnosis not present

## 2021-12-11 LAB — GLUCOSE, CAPILLARY: Glucose-Capillary: 91 mg/dL (ref 70–99)

## 2021-12-11 MED ORDER — WHITE PETROLATUM EX OINT
TOPICAL_OINTMENT | CUTANEOUS | Status: AC
  Filled 2021-12-11: qty 5

## 2021-12-11 MED ORDER — ARIPIPRAZOLE ER 400 MG IM SRER
400.0000 mg | INTRAMUSCULAR | Status: DC
Start: 2021-12-11 — End: 2021-12-12
  Filled 2021-12-11: qty 2

## 2021-12-11 MED ORDER — MELATONIN 5 MG PO TABS
5.0000 mg | ORAL_TABLET | Freq: Every evening | ORAL | Status: DC | PRN
  Administered 2021-12-11 – 2022-01-08 (×22): 5 mg via ORAL
  Filled 2021-12-11 (×23): qty 1

## 2021-12-11 NOTE — Progress Notes (Signed)
Pt pacing hallway this morning. Pt medication compliant. Pt utilized prn ibuprofen for c/o right knee pain 10/10 that patient states he had as a child then resolved and pain returned 1 months ago. Pt unsure of etiology. Pt denies a/v hallucinations as well as SI/HI/self harm thoughts. Pt presents with anxious affect. Pt accepted nicotine patch however expressed concern that "it would make me stop smoking." Nurse educated pt about nicotine patch while hospitalized. Pt approached nurse later stating he took nicotine patch off because he laid down to rest and had a nightmare of freddy kruger.

## 2021-12-11 NOTE — Progress Notes (Signed)
[facility] MD Progress Note  12/11/2021 9:57 PM [name]  MRN:  [id number] Subjective:  [name] is a 46 year old male with a reported past psychiatric history of schizophrenia-paranoid, bipolar disorder housing insecurity who presented to [facility] ED on 8/3 under IVC for psychosis and bizarre behaviors.    Yesterday's Recommendations per Psychiatry Team: --Start Abilify 10 mg; will increase to 15 mg tomorrow as oral bridge while confirming if patient received LAI. - Continue home Zyprexa 10 mg twice daily - Continue Depakote ER 750 mg twice daily -Continue Cogentin 0.5 mg twice daily  On Evaluation Today: Patient is much less tangential and more appropriate in conversation today. He has normal largely latency of responses.  He denies acute concerns or complaints today. He reports sleeping well and intact appetite. He denies somatic complaints. He denies AVH and does not make delusional statements; he says that he has not been communicating with the president through the television. He denies adverse effects of his medications.  Collateral: [employer] [employer] [phone]): Spoke to [facility]. Last IM received. Patient was at consistent baseline, then began to have sexual dysfunction and weight gain. He then became inconsistent with taking medications. While at [facility], they did not send discharge paperwork to ACTT team. She does not believe he had injection. Last two appointments with Dr. Silvano Rusk missed, one due to being in the hospital, and the other missed without reason. Patient was at [facility] from 7/8-7/18. Carolee Rota does not believe that he received IM at that time. Call back: Patient last received LAI 6/1, but may have received one while at [facility].     Principal Problem: Schizoaffective disorder, bipolar type ([place]) Diagnosis: Principal Problem:   Schizoaffective disorder, bipolar type ([place]) Active Problems:   Tobacco use disorder   Total Time spent with patient: 20  minutes  Past Psychiatric History: See H&P  Past Medical History:  Past Medical History:  Diagnosis Date   Bipolar disorder ([place])    Bronchitis    Hyperlipidemia    Paranoid schizophrenia ([place])    Schizophrenia ([place])     Past Surgical History:  Procedure Laterality Date   TESTICLE IMPLANTATION TO THIGH     TESTICLE SURGERY     Family History:  Family History  Problem Relation Age of Onset   Schizophrenia Neg Hx    Family Psychiatric  History: See H&P Social History:  Social History   Substance and Sexual Activity  Alcohol Use Not Currently   Comment: weekly     Social History   Substance and Sexual Activity  Drug Use Yes   Types: Marijuana   Comment: smokes hemp    Social History   Socioeconomic History   Marital status: Single    Spouse name: Not on file   Number of children: Not on file   Years of education: 12 years   Highest education level: 12th grade  Occupational History   Occupation: On disability  Tobacco Use   Smoking status: Every Day    Packs/day: 1.00    Types: Cigarettes   Smokeless tobacco: Never  Vaping Use   Vaping Use: Never used  Substance and Sexual Activity   Alcohol use: Not Currently    Comment: weekly   Drug use: Yes    Types: Marijuana    Comment: smokes hemp   Sexual activity: Yes    Birth control/protection: Condom  Other Topics Concern   Not on file  Social History Narrative   Pt lives alone in  apartment complex for mentally ill   Social Determinants of Health   Financial Resource Strain: Not on file  Food Insecurity: Not on file  Transportation Needs: Not on file  Physical Activity: Not on file  Stress: Not on file  Social Connections: Not on file   Additional Social History:       Sleep: Good  Appetite:  Good  Current Medications: Current Facility-Administered Medications  Medication Dose Route Frequency Provider Last Rate Last Admin   alum & mag hydroxide-simeth (MAALOX/MYLANTA) 200-200-20 MG/5ML  suspension 30 mL  30 mL Oral Q4H PRN Bobbitt, Shalon E, NP   30 mL at 12/11/21 1819   ARIPiprazole (ABILIFY) tablet 15 mg  15 mg Oral Daily Massengill, Nathan, MD   15 mg at 12/11/21 0825   ARIPiprazole ER (ABILIFY MAINTENA) injection 400 mg  400 mg Intramuscular Q28 days Massengill, Ovid Curd, MD       atorvastatin (LIPITOR) tablet 40 mg  40 mg Oral QHS Bobbitt, Shalon E, NP   40 mg at 12/11/21 2048   benztropine (COGENTIN) tablet 0.5 mg  0.5 mg Oral BID Bobbitt, Shalon E, NP   0.5 mg at 12/11/21 1642   divalproex (DEPAKOTE) DR tablet 750 mg  750 mg Oral Q12H Bobbitt, Shalon E, NP   750 mg at 12/11/21 2048   empagliflozin (JARDIANCE) tablet 10 mg  10 mg Oral Daily Rosezetta Schlatter, MD   10 mg at 12/11/21 0841   hydrOXYzine (ATARAX) tablet 25 mg  25 mg Oral TID PRN Bobbitt, Shalon E, NP   25 mg at 12/10/21 2027   ibuprofen (ADVIL) tablet 800 mg  800 mg Oral BID PRN Bobbitt, Shalon E, NP   800 mg at 12/11/21 1017   magnesium hydroxide (MILK OF MAGNESIA) suspension 30 mL  30 mL Oral Daily PRN Bobbitt, Shalon E, NP   30 mL at 12/10/21 2029   melatonin tablet 5 mg  5 mg Oral QHS PRN Massengill, Ovid Curd, MD   5 mg at 12/11/21 2049   metFORMIN (GLUCOPHAGE) tablet 1,000 mg  1,000 mg Oral BID WC Bobbitt, Shalon E, NP   1,000 mg at 12/11/21 1642   nicotine (NICODERM CQ - dosed in mg/24 hours) patch 21 mg  21 mg Transdermal Daily Rosezetta Schlatter, MD   21 mg at 12/11/21 0842   OLANZapine (ZYPREXA) tablet 10 mg  10 mg Oral BID Rosezetta Schlatter, MD   10 mg at 12/11/21 2048   OLANZapine zydis (ZYPREXA) disintegrating tablet 10 mg  10 mg Oral Q8H PRN Bobbitt, Shalon E, NP       And   ziprasidone (GEODON) injection 20 mg  20 mg Intramuscular PRN Bobbitt, Shalon E, NP       sodium chloride (OCEAN) 0.65 % nasal spray 1 spray  1 spray Each Nare PRN Rosezetta Schlatter, MD   1 spray at 12/10/21 2029   traZODone (DESYREL) tablet 50 mg  50 mg Oral QHS PRN Bobbitt, Shalon E, NP   50 mg at 12/11/21 2048    Lab Results:  Results  for orders placed or performed during the hospital encounter of 12/07/21 (from the past 48 hour(s))  Glucose, capillary     Status: Abnormal   Collection Time: 12/10/21  5:33 AM  Result Value Ref Range   Glucose-Capillary 101 (H) 70 - 99 mg/dL    Comment: Glucose reference range applies only to samples taken after fasting for at least 8 hours.  Glucose, capillary     Status: None  Collection Time: 12/11/21  5:12 AM  Result Value Ref Range   Glucose-Capillary 91 70 - 99 mg/dL    Comment: Glucose reference range applies only to samples taken after fasting for at least 8 hours.    Blood Alcohol level:  Lab Results  Component Value Date   ETH <10 11/28/2021   ETH <10 01/60/1093    Metabolic Disorder Labs: Lab Results  Component Value Date   HGBA1C 6.9 (H) 11/17/2021   MPG 151.33 11/17/2021   MPG 111.15 02/03/2020   Lab Results  Component Value Date   PROLACTIN 8.1 06/01/2019   Lab Results  Component Value Date   CHOL 130 12/01/2021   TRIG 172 (H) 12/01/2021   HDL 28 (L) 12/01/2021   CHOLHDL 4.6 12/01/2021   VLDL 34 12/01/2021   LDLCALC 68 12/01/2021   LDLCALC 97 02/03/2020    Physical Findings:  Musculoskeletal: Strength & Muscle Tone: within normal limits Gait & Station: normal Patient leans: N/A  Psychiatric Specialty Exam:  Presentation  General Appearance: Appropriate for Environment; Casual   Eye Contact:Good   Speech:Clear and Coherent; Normal Rate   Speech Volume:Normal   Handedness:Right    Mood and Affect  Mood:Euthymic   Affect:Congruent    Thought Process  Thought Processes:Coherent   Descriptions of Associations:Intact (Largely intact, somewhat tangential)   Orientation:Full (Time, Place and Person)   Thought Content:WDL   History of Schizophrenia/Schizoaffective disorder:Yes   Duration of Psychotic Symptoms:Greater than six months   Hallucinations:Hallucinations: None   Ideas of Reference:None   Suicidal  Thoughts:Suicidal Thoughts: No   Homicidal Thoughts:Homicidal Thoughts: No    Sensorium  Memory:Immediate Fair; Recent Fair   Judgment:Impaired   Insight:Fair; Shallow    Executive Functions  Concentration:Fair   Attention Span:Fair   [place]    Psychomotor Activity  Psychomotor Activity:Psychomotor Activity: Normal    Assets  Assets:Desire for Improvement; Housing; Resilience; Social Support    Sleep  Sleep:Sleep: Fair     Physical Exam: Physical Exam Vitals reviewed.  Constitutional:      General: He is not in acute distress.    Appearance: He is not toxic-appearing.  HENT:     Head: Normocephalic and atraumatic.     Mouth/Throat:     Mouth: Mucous membranes are moist.     Pharynx: Oropharynx is clear.  Pulmonary:     Effort: Pulmonary effort is normal.  Skin:    General: Skin is warm and dry.  Neurological:     Mental Status: He is alert.     Motor: No weakness.     Gait: Gait normal.    Review of Systems  Cardiovascular:  Negative for chest pain.  Gastrointestinal: Negative.   Genitourinary: Negative.   Musculoskeletal: Negative.   Neurological:  Negative for dizziness, tremors and headaches.   Blood pressure 135/74, pulse 98, temperature 98.5 F (36.9 C), temperature source Oral, resp. rate 18, height '6\' 1"'  (1.854 m), weight 117.9 kg, SpO2 99 %. Body mass index is 34.3 kg/m.   Treatment Plan Summary: ASSESSMENT: Principal Problem:   Schizoaffective disorder, bipolar type ([place]) Active Problems:   Tobacco use disorder   Patient appears to be improving on current regimen, which leads [place] to believe that although he picked up his medications on time, he has not been compliant with taking them.  See collateral from ACT team above.   PLAN: Safety and Monitoring:             -- (  In)Voluntary admission to inpatient psychiatric unit for safety, stabilization and treatment              -- Daily contact with patient to assess and evaluate symptoms and progress in treatment             -- Patient's case to be discussed in multi-disciplinary team meeting             -- Observation Level : q15 minute checks             -- Vital signs:  q12 hours             -- Precautions: suicide, elopement, and assault   2. Psychiatric Diagnoses and Treatment:  # Schizoaffective disorder-bipolar type - Continue home Zyprexa 10 mg twice daily. Patient improving appropriately so will not consider starting clozaril at this time -- Patient currently on Abilify Maintena 400 mg LAI, last kit filled 7/18. But last know LAI 6/1; may have received one during [facility]. Patient to receive LAI during this admission. Continue PO bridge with Abilify 15 daily.  - Continue Depakote ER 750 mg twice daily             -VPA level 63 (prev 65), LFTs WNL, Plt 364 -Continue Cogentin 0.5 mg twice daily   PRN: Agitation protocol with Zyprexa 10 mg, Geodon 20 mg, and Ativan 1 mg   -- The risks/benefits/side-effects/alternatives to this medication were discussed in detail with the patient and time was given for questions. The patient consents to medication trial.              -- Metabolic profile and EKG monitoring obtained while on an atypical antipsychotic  BMI: 34.3 Lipid Panel: TG 172 HbgA1c: 6.9% QTc:              -- Encouraged patient to participate in unit milieu and in scheduled group therapies      3. Medical Issues Being Addressed: #Nicotine use disorder - Nicotine patch 21 mg daily   #T2DM A.m. CBG 91. Home medication Synjardi 01/999 mg BID.  Due to the fact that patient is actively psychotic, will discontinue 4 times daily CBG monitoring, and only complete a.m. checks.  Patient on home regimen and diabetes well-controlled (A1c 6.9%), so minimal concern for decompensation. -Metformin 1000 mg twice daily with meals - Jardiance 10 mg every morning - A.m. CBG monitoring only.  Will discontinue  SSI.   #Hyperlipidemia - Continue home atorvastatin 40 mg daily  4. Discharge Planning:               -- Social work and case management to assist with discharge planning and identification of hospital follow-up needs prior to discharge             -- Estimated LOS: 5-7 days             -- Discharge Concerns: Need to establish a safety plan; Medication compliance and effectiveness             -- Discharge Goals: Return home with outpatient referrals for mental health follow-up including medication management/psychotherapy   Rosezetta Schlatter, MD 12/11/2021, 9:57 PM

## 2021-12-11 NOTE — Progress Notes (Signed)
Adult Psychoeducational Group Note  Date:  12/11/2021 Time:  9:10 PM  Group Topic/Focus:  Wrap-Up Group:   The focus of this group is to help patients review their daily goal of treatment and discuss progress on daily workbooks.  Participation Level:  Active  Participation Quality:  Appropriate  Affect:  Appropriate  Cognitive:  Appropriate  Insight: Appropriate  Engagement in Group:  Developing/Improving  Modes of Intervention:  Discussion  Additional Comments: Pt stated his goal for today was to focus on his treatment plan. Pt stated he accomplished his goal today. Pt stated he talked with his doctor and social worker about his care today. Pt rated his overall day a 10. Pt stated he was able to contact his father today. Pt stated he felt better about himself today. Pt stated he was able to attend all meals. Pt stated he took all medications provided today. Pt stated he attend all groups held today. Pt stated his appetite was fair today. Pt rated sleep last night was fair. Pt stated the goal tonight was to get some rest. Pt stated he had no physical pain tonight. Pt deny visual hallucinations and auditory issues tonight. Pt denies thoughts of harming himself or others. Pt stated he would alert staff if anything changed  Felipa Furnace 12/11/2021, 9:10 PM

## 2021-12-12 DIAGNOSIS — F250 Schizoaffective disorder, bipolar type: Secondary | ICD-10-CM | POA: Diagnosis not present

## 2021-12-12 DIAGNOSIS — E119 Type 2 diabetes mellitus without complications: Secondary | ICD-10-CM | POA: Diagnosis not present

## 2021-12-12 DIAGNOSIS — F17200 Nicotine dependence, unspecified, uncomplicated: Secondary | ICD-10-CM | POA: Diagnosis not present

## 2021-12-12 LAB — GLUCOSE, CAPILLARY: Glucose-Capillary: 83 mg/dL (ref 70–99)

## 2021-12-12 MED ORDER — ARIPIPRAZOLE 10 MG PO TABS
20.0000 mg | ORAL_TABLET | Freq: Every day | ORAL | Status: DC
  Administered 2021-12-13 – 2021-12-14 (×2): 20 mg via ORAL
  Filled 2021-12-12 (×3): qty 2

## 2021-12-12 MED ORDER — OLANZAPINE 10 MG PO TABS
20.0000 mg | ORAL_TABLET | Freq: Every day | ORAL | Status: DC
  Administered 2021-12-12 – 2021-12-20 (×9): 20 mg via ORAL
  Filled 2021-12-12 (×11): qty 2

## 2021-12-12 MED ORDER — WHITE PETROLATUM EX OINT
TOPICAL_OINTMENT | CUTANEOUS | Status: AC
  Filled 2021-12-12: qty 5

## 2021-12-12 MED ORDER — OLANZAPINE 10 MG PO TABS
10.0000 mg | ORAL_TABLET | Freq: Every day | ORAL | Status: DC
  Administered 2021-12-13 – 2021-12-21 (×9): 10 mg via ORAL
  Filled 2021-12-12 (×11): qty 1

## 2021-12-12 MED ORDER — TEMAZEPAM 15 MG PO CAPS
15.0000 mg | ORAL_CAPSULE | Freq: Every evening | ORAL | Status: DC | PRN
Start: 2021-12-12 — End: 2021-12-14
  Administered 2021-12-12 – 2021-12-13 (×2): 15 mg via ORAL
  Filled 2021-12-12 (×2): qty 1

## 2021-12-12 MED ORDER — ARIPIPRAZOLE ER 400 MG IM SRER
400.0000 mg | INTRAMUSCULAR | Status: DC
Start: 2021-12-12 — End: 2021-12-20
  Administered 2021-12-12: 400 mg via INTRAMUSCULAR

## 2021-12-12 MED ORDER — WHITE PETROLATUM EX OINT
TOPICAL_OINTMENT | CUTANEOUS | Status: AC
  Administered 2021-12-12: 1
  Filled 2021-12-12: qty 10

## 2021-12-12 NOTE — Progress Notes (Signed)
   12/12/21 0400  Psych Admission Type (Psych Patients Only)  Admission Status Involuntary  Psychosocial Assessment  Patient Complaints Anxiety  Eye Contact Fair  Facial Expression Animated  Affect Anxious;Appropriate to circumstance  Speech Tangential  Interaction Assertive  Motor Activity Pacing  Appearance/Hygiene Disheveled  Behavior Characteristics Cooperative  Mood Preoccupied;Pleasant  Aggressive Behavior  Effect No apparent injury  Thought Process  Coherency Disorganized  Content Delusions  Delusions Paranoid  Perception Hallucinations  Hallucination Auditory;Visual  Judgment Limited  Confusion None  Danger to Self  Current suicidal ideation? Denies  Danger to Others  Danger to Others None reported or observed

## 2021-12-12 NOTE — Progress Notes (Addendum)
Pt continues to present with loose associations, flight of ideas and delusions. Pt will have lucid thoughts for some time, but will go back to his delusional thinking and confabulation at times but has always been pleasant during stays at [facility] . Pt has required large doses of medication in the past to help with sleep

## 2021-12-12 NOTE — Progress Notes (Signed)
Pt up pacing in his room, pt appearing to get a little agitated, pt given PRN Zyprexa and Vistaril per [facility]

## 2021-12-12 NOTE — Progress Notes (Signed)
   12/12/21 1300  Charting Type  Charting Type Shift assessment  Safety Check Verification  Has the RN verified the 15 minute safety check completion? Yes  Neurological  Neuro (WDL) WDL  Glasgow Coma Scale  Eye Opening 4  Best Verbal Response (NON-intubated) 5  Best Motor Response 6  Glasgow Coma Scale Score 15  HEENT  HEENT (WDL) WDL  Respiratory  Respiratory (WDL) WDL  Cardiac  Cardiac (WDL) WDL  Vascular  Vascular (WDL) WDL  Integumentary  Integumentary (WDL) WDL  Braden Scale (Ages 8 and up)  Sensory Perceptions 4  Moisture 4  Activity 4  Mobility 4  Nutrition 3  Friction and Shear 3  Braden Scale Score 22  Musculoskeletal  Musculoskeletal (WDL) WDL  Assistive Device None  Gastrointestinal  Gastrointestinal (WDL) WDL  GU Assessment  Genitourinary (WDL) WDL  Neurological  Level of Consciousness Alert

## 2021-12-12 NOTE — Progress Notes (Signed)
[facility] MD Progress Note  12/12/2021 11:04 AM [name]  MRN:  [id number] Subjective:  [name] is a 46 year old male with a reported past psychiatric history of schizophrenia-paranoid, bipolar disorder housing insecurity who presented to [facility] ED on 8/3 under IVC for psychosis and bizarre behaviors.    Yesterday's Recommendations per Psychiatry Team: - Continue home Zyprexa 10 mg twice daily. Patient improving appropriately so will not consider starting clozaril at this time -- Patient currently on Abilify Maintena 400 mg LAI, last kit filled 7/18. But last known LAI 6/1; may have received one during [facility]. Patient to receive LAI during this admission. Continue PO bridge with Abilify 15 daily x 7 days after receiving LAI.  - Continue Depakote ER 750 mg twice daily             -VPA level 63 (prev 65), LFTs WNL, Plt 364 -Continue Cogentin 0.5 mg twice daily  On Evaluation Today: On attending's assessment yesterday afternoon, patient was more bizarre and tangential than on this writer's assessment in the morning.  This morning, patient remains tangential, disorganized in thought process, and once again not easily interruptible.  Patient endorses eating well and sleeping well, but for approximately 4 to 5 hours last night.  RN documentation shows that patient slept for 2.25 hours.  Patient is grandiose, stating that he "knows the cure for everything but Alzheimer's disease."  He denies SI/HI/AVH, but reports that he is "just talking to God and thinking of moving ideas."  He reports knee pain, but denies all other somatic concerns and complaints today.  He denies adverse effects of medications.    Principal Problem: Schizoaffective disorder, bipolar type ([place]) Diagnosis: Principal Problem:   Schizoaffective disorder, bipolar type ([place]) Active Problems:   Tobacco use disorder   Total Time spent with patient: 20 minutes  Past Psychiatric History: See H&P  Past Medical  History:  Past Medical History:  Diagnosis Date   Bipolar disorder ([place])    Bronchitis    Hyperlipidemia    Paranoid schizophrenia ([employer])    Schizophrenia ([place])     Past Surgical History:  Procedure Laterality Date   TESTICLE IMPLANTATION TO THIGH     TESTICLE SURGERY     Family History:  Family History  Problem Relation Age of Onset   Schizophrenia Neg Hx    Family Psychiatric  History: See H&P Social History:  Social History   Substance and Sexual Activity  Alcohol Use Not Currently   Comment: weekly     Social History   Substance and Sexual Activity  Drug Use Yes   Types: Marijuana   Comment: smokes hemp    Social History   Socioeconomic History   Marital status: Single    Spouse name: Not on file   Number of children: Not on file   Years of education: 12 years   Highest education level: 12th grade  Occupational History   Occupation: On disability  Tobacco Use   Smoking status: Every Day    Packs/day: 1.00    Types: Cigarettes   Smokeless tobacco: Never  Vaping Use   Vaping Use: Never used  Substance and Sexual Activity   Alcohol use: Not Currently    Comment: weekly   Drug use: Yes    Types: Marijuana    Comment: smokes hemp   Sexual activity: Yes    Birth control/protection: Condom  Other Topics Concern   Not on file  Social History Narrative   Pt lives  alone in apartment complex for mentally ill   Social Determinants of Health   Financial Resource Strain: Not on file  Food Insecurity: Not on file  Transportation Needs: Not on file  Physical Activity: Not on file  Stress: Not on file  Social Connections: Not on file   Additional Social History:       Sleep: Poor  Appetite:  Good  Current Medications: Current Facility-Administered Medications  Medication Dose Route Frequency Provider Last Rate Last Admin   alum & mag hydroxide-simeth (MAALOX/MYLANTA) 200-200-20 MG/5ML suspension 30 mL  30 mL Oral Q4H PRN Bobbitt, Shalon E, NP   30  mL at 12/11/21 1819   [START ON 12/13/2021] ARIPiprazole (ABILIFY) tablet 20 mg  20 mg Oral Daily Laiba Fuerte, Loma Sousa, MD       ARIPiprazole ER (ABILIFY MAINTENA) injection 400 mg  400 mg Intramuscular Q28 days Massengill, Ovid Curd, MD   400 mg at 12/12/21 0935   atorvastatin (LIPITOR) tablet 40 mg  40 mg Oral QHS Bobbitt, Shalon E, NP   40 mg at 12/11/21 2048   benztropine (COGENTIN) tablet 0.5 mg  0.5 mg Oral BID Bobbitt, Shalon E, NP   0.5 mg at 12/12/21 0808   divalproex (DEPAKOTE) DR tablet 750 mg  750 mg Oral Q12H Bobbitt, Shalon E, NP   750 mg at 12/12/21 0807   empagliflozin (JARDIANCE) tablet 10 mg  10 mg Oral Daily Rosezetta Schlatter, MD   10 mg at 12/12/21 0807   hydrOXYzine (ATARAX) tablet 25 mg  25 mg Oral TID PRN Bobbitt, Shalon E, NP   25 mg at 12/12/21 0034   ibuprofen (ADVIL) tablet 800 mg  800 mg Oral BID PRN Bobbitt, Shalon E, NP   800 mg at 12/11/21 1017   magnesium hydroxide (MILK OF MAGNESIA) suspension 30 mL  30 mL Oral Daily PRN Bobbitt, Shalon E, NP   30 mL at 12/10/21 2029   melatonin tablet 5 mg  5 mg Oral QHS PRN Massengill, Ovid Curd, MD   5 mg at 12/11/21 2049   metFORMIN (GLUCOPHAGE) tablet 1,000 mg  1,000 mg Oral BID WC Bobbitt, Shalon E, NP   1,000 mg at 12/12/21 0233   nicotine (NICODERM CQ - dosed in mg/24 hours) patch 21 mg  21 mg Transdermal Daily Rosezetta Schlatter, MD   21 mg at 12/12/21 0808   [START ON 12/13/2021] OLANZapine (ZYPREXA) tablet 10 mg  10 mg Oral Daily Rosezetta Schlatter, MD       OLANZapine (ZYPREXA) tablet 20 mg  20 mg Oral QHS Rosezetta Schlatter, MD       OLANZapine zydis (ZYPREXA) disintegrating tablet 10 mg  10 mg Oral Q8H PRN Bobbitt, Shalon E, NP   10 mg at 12/12/21 0034   And   ziprasidone (GEODON) injection 20 mg  20 mg Intramuscular PRN Bobbitt, Shalon E, NP       sodium chloride (OCEAN) 0.65 % nasal spray 1 spray  1 spray Each Nare PRN Rosezetta Schlatter, MD   1 spray at 12/10/21 2029   temazepam (RESTORIL) capsule 15 mg  15 mg Oral QHS PRN Rosezetta Schlatter,  MD       And   temazepam (RESTORIL) capsule 15 mg  15 mg Oral QHS PRN Rosezetta Schlatter, MD       traZODone (DESYREL) tablet 50 mg  50 mg Oral QHS PRN Bobbitt, Shalon E, NP   50 mg at 12/11/21 2048    Lab Results:  Results for orders placed or performed during the  hospital encounter of 12/07/21 (from the past 48 hour(s))  Glucose, capillary     Status: None   Collection Time: 12/11/21  5:12 AM  Result Value Ref Range   Glucose-Capillary 91 70 - 99 mg/dL    Comment: Glucose reference range applies only to samples taken after fasting for at least 8 hours.  Glucose, capillary     Status: None   Collection Time: 12/12/21  5:50 AM  Result Value Ref Range   Glucose-Capillary 83 70 - 99 mg/dL    Comment: Glucose reference range applies only to samples taken after fasting for at least 8 hours.    Blood Alcohol level:  Lab Results  Component Value Date   ETH <10 11/28/2021   ETH <10 32/54/9826    Metabolic Disorder Labs: Lab Results  Component Value Date   HGBA1C 6.9 (H) 11/17/2021   MPG 151.33 11/17/2021   MPG 111.15 02/03/2020   Lab Results  Component Value Date   PROLACTIN 8.1 06/01/2019   Lab Results  Component Value Date   CHOL 130 12/01/2021   TRIG 172 (H) 12/01/2021   HDL 28 (L) 12/01/2021   CHOLHDL 4.6 12/01/2021   VLDL 34 12/01/2021   LDLCALC 68 12/01/2021   LDLCALC 97 02/03/2020    Physical Findings:  Musculoskeletal: Strength & Muscle Tone: within normal limits Gait & Station: normal Patient leans: N/A  Psychiatric Specialty Exam:  Presentation  General Appearance: Appropriate for Environment; Casual   Eye Contact:Good   Speech:Clear and Coherent; Normal Rate (Verbose)   Speech Volume:Normal   Handedness:Right    Mood and Affect  Mood:Euthymic   Affect:Blunt; Congruent    Thought Process  Thought Processes:Disorganized   Descriptions of Associations:Tangential   Orientation:Full (Time, Place and Person)   Thought  Content:Tangential; Scattered   History of Schizophrenia/Schizoaffective disorder:Yes   Duration of Psychotic Symptoms:Greater than six months   Hallucinations:Hallucinations: None   Ideas of Reference:None   Suicidal Thoughts:Suicidal Thoughts: No   Homicidal Thoughts:Homicidal Thoughts: No    Sensorium  Memory:Immediate Fair; Recent Fair   Judgment:Impaired   Insight:Fair; Shallow    Executive Functions  Concentration:Fair   Attention Span:Fair   [place]    Psychomotor Activity  Psychomotor Activity:Psychomotor Activity: Normal    Assets  Assets:Desire for Improvement; Housing; Resilience; Social Support    Sleep  Sleep:Sleep: Poor Number of Hours of Sleep: 2.25     Physical Exam: Physical Exam Vitals reviewed.  Constitutional:      General: He is not in acute distress.    Appearance: He is not toxic-appearing.  HENT:     Head: Normocephalic and atraumatic.     Mouth/Throat:     Mouth: Mucous membranes are moist.     Pharynx: Oropharynx is clear.  Pulmonary:     Effort: Pulmonary effort is normal.  Skin:    General: Skin is warm and dry.  Neurological:     Mental Status: He is alert.     Motor: No weakness.     Gait: Gait normal.    Review of Systems  Cardiovascular:  Negative for chest pain.  Gastrointestinal: Negative.   Genitourinary: Negative.   Musculoskeletal: Negative.   Neurological:  Negative for dizziness, tremors and headaches.   Blood pressure 107/70, pulse (!) 105, temperature 98 F (36.7 C), temperature source Oral, resp. rate 18, height $RemoveBe'6\' 1"'edIBPZkcq$  (1.854 m), weight 117.9 kg, SpO2 99 %. Body mass index is 34.3 kg/m.   Treatment  Plan Summary: ASSESSMENT: Principal Problem:   Schizoaffective disorder, bipolar type ([place]) Active Problems:   Tobacco use disorder   Patient appears to have minimal improvements on current regimen, which leads [place] to believe that  although he picked up his medications on time, he has not been compliant with taking them. ACT team confirmed that patient has not had LAI per the records since 6/1.   PLAN: Safety and Monitoring:             -- (In)Voluntary admission to inpatient psychiatric unit for safety, stabilization and treatment             -- Daily contact with patient to assess and evaluate symptoms and progress in treatment             -- Patient's case to be discussed in multi-disciplinary team meeting             -- Observation Level : q15 minute checks             -- Vital signs:  q12 hours             -- Precautions: suicide, elopement, and assault   2. Psychiatric Diagnoses and Treatment:  # Schizoaffective disorder-bipolar type - Increase to Zyprexa 10 mg every morning and 20 mg nightly.  -- Patient currently on Abilify Maintena 400 mg LAI, last known LAI 6/1. Patient to receive LAI today, and will continue PO bridge x 7 days post-LAI.  Currently taking Abilify 15 mg, will increase to 20 mg tomorrow. - Continue Depakote ER 750 mg twice daily             -VPA level 63 (prev 65), LFTs WNL, Plt 364 -Continue Cogentin 0.5 mg twice daily   PRN: Agitation protocol with Zyprexa 10 mg, Geodon 20 mg, and Ativan 1 mg   -- The risks/benefits/side-effects/alternatives to this medication were discussed in detail with the patient and time was given for questions. The patient consents to medication trial.              -- Metabolic profile and EKG monitoring obtained while on an atypical antipsychotic  BMI: 34.3 Lipid Panel: TG 172 HbgA1c: 6.9% QTc:              -- Encouraged patient to participate in unit milieu and in scheduled group therapies      3. Medical Issues Being Addressed: #Nicotine use disorder - Nicotine patch 21 mg daily   #T2DM A.m. CBG 83. Home medication Synjardi 01/999 mg BID.  Due to the fact that patient is actively psychotic, only completing a.m. sleep e.g. monitoring.  Patient on home  regimen and diabetes well-controlled (A1c 6.9%), so minimal concern for decompensation. -Metformin 1000 mg twice daily with meals - Jardiance 10 mg every morning - A.m. CBG monitoring only. SSI was implemented and discontinued.   #Hyperlipidemia - Continue home atorvastatin 40 mg daily  4. Discharge Planning:               -- Social work and case management to assist with discharge planning and identification of hospital follow-up needs prior to discharge             -- Estimated LOS: 5-7 days             -- Discharge Concerns: Need to establish a safety plan; Medication compliance and effectiveness             -- Discharge Goals: Return home with outpatient referrals for  mental health follow-up including medication management/psychotherapy   Rosezetta Schlatter, MD 12/12/2021, 11:04 AM

## 2021-12-12 NOTE — Progress Notes (Signed)
   12/12/21 2000  Psych Admission Type (Psych Patients Only)  Admission Status Involuntary  Psychosocial Assessment  Patient Complaints Suspiciousness  Eye Contact Fair  Facial Expression Animated  Affect Anxious;Appropriate to circumstance  Speech Tangential  Interaction Assertive  Motor Activity Pacing  Appearance/Hygiene Disheveled  Behavior Characteristics Cooperative  Mood Suspicious;Preoccupied  Aggressive Behavior  Effect No apparent injury  Thought Process  Coherency Disorganized  Content Delusions  Delusions Paranoid  Perception Hallucinations  Hallucination Auditory;Visual  Judgment Limited  Confusion None  Danger to Self  Current suicidal ideation? Denies  Danger to Others  Danger to Others None reported or observed

## 2021-12-12 NOTE — Progress Notes (Signed)
[facility] Group Notes:  (Nursing/MHT/Case Management/Adjunct)  Date:  12/12/2021  Time:  2000  Type of Therapy:   wrap up group  Participation Level:  Active  Participation Quality:  Appropriate, Attentive, and Sharing  Affect:  Flat  Cognitive:  Alert  Insight:  Improving  Engagement in Group:  Engaged  Modes of Intervention:  Clarification, Education, and Support  Summary of Progress/Problems: Positive thinking and positive change were discussed.   [name] 12/12/2021, 8:36 PM

## 2021-12-13 DIAGNOSIS — F17200 Nicotine dependence, unspecified, uncomplicated: Secondary | ICD-10-CM | POA: Diagnosis not present

## 2021-12-13 DIAGNOSIS — F250 Schizoaffective disorder, bipolar type: Secondary | ICD-10-CM | POA: Diagnosis not present

## 2021-12-13 DIAGNOSIS — E119 Type 2 diabetes mellitus without complications: Secondary | ICD-10-CM | POA: Diagnosis not present

## 2021-12-13 LAB — GLUCOSE, CAPILLARY: Glucose-Capillary: 101 mg/dL — ABNORMAL HIGH (ref 70–99)

## 2021-12-13 MED ORDER — WHITE PETROLATUM EX OINT
TOPICAL_OINTMENT | CUTANEOUS | Status: AC
  Administered 2021-12-13: 1
  Filled 2021-12-13: qty 5

## 2021-12-13 NOTE — Progress Notes (Signed)
   12/13/21 2100  Psych Admission Type (Psych Patients Only)  Admission Status Involuntary  Psychosocial Assessment  Patient Complaints Worrying;Suspiciousness;Hyperactivity  Eye Contact Fair  Facial Expression Animated  Affect Anxious;Appropriate to circumstance  Child psychotherapist Assertive  Motor Activity Pacing  Appearance/Hygiene Disheveled  Behavior Characteristics Cooperative  Mood Preoccupied;Pleasant;Suspicious  Aggressive Behavior  Effect No apparent injury  Thought Process  Coherency Disorganized  Content Delusions  Delusions Paranoid  Perception Hallucinations  Hallucination Auditory;Visual  Judgment Limited  Confusion None  Danger to Self  Current suicidal ideation? Denies  Danger to Others  Danger to Others None reported or observed

## 2021-12-13 NOTE — Group Note (Signed)
Occupational Therapy Group Note   Group Topic:Goal Setting  Group Date: 12/13/2021 Start Time: 1400 End Time: 1455 Facilitators: Ted Mcalpine, OT   Group Description: Group encouraged engagement and participation through discussion focused on goal setting. Group members were introduced to goal-setting using the SMART Goal framework, identifying goals as Specific, Measureable, Acheivable, Relevant, and Time-Bound. Group members took time from group to create their own personal goal reflecting the SMART goal template and shared for review by peers and OT.    Therapeutic Goal(s):  Identify at least one goal that fits the SMART framework    Participation Level: Hyperverbal   Participation Quality: Moderate Cues   Behavior: Attentive , Hyperverbal, Off-task, and Perseverative/Ruminative    Speech/Thought Process: Disorganized and Loose association    Affect/Mood: Flat   Insight: Limited   Judgement: Limited   Individualization: pt was active yet disorganized t/out in their participation of group discussion/activity. Minimal new skills identified  Modes of Intervention: Discussion and Education  Patient Response to Interventions:  Engaged   Plan: Continue to engage patient in OT groups 2 - 3x/week.  12/13/2021  Ted Mcalpine, OT  Kerrin Champagne, OT

## 2021-12-13 NOTE — Progress Notes (Signed)
Pt is very disorganized, tangential with flight of ideas, loose associations on interactions. Noted pacing in hall and talking to self majority of the day animated but confused and hyper-religious as well. Pt believes Clinical research associate is a twin for a friend he knows. States his church member child died and with prayers the child was reincarnated back to life". Unable to focus on education at this time due to altered mental status. Safety checks maintained at Q 15 intervals without outburst. Support and encouragement offered. Verbal education provided on all medications and effects monitored. Pt tolerates all meals, fluids and medications well. Attended scheduled groups with prompts. Continue to need frequent verbal redirections but has been cooperative with care.

## 2021-12-13 NOTE — Progress Notes (Signed)
[facility] MD Progress Note  12/13/2021 12:29 PM [name]  MRN:  [id number] Subjective:  [name] is a 46 year old male with a reported past psychiatric history of schizophrenia-paranoid, bipolar disorder housing insecurity who presented to [facility] ED on 8/3 under IVC for psychosis and bizarre behaviors.    Yesterday's Recommendations per Psychiatry Team: - Increase to Zyprexa 10 mg every morning and 20 mg nightly.  -- Patient currently on Abilify Maintena 400 mg LAI, last known LAI 6/1. Patient to receive LAI today, and will continue PO bridge x 7 days post-LAI.  Currently taking Abilify 15 mg, will increase to 20 mg tomorrow. - Continue Depakote ER 750 mg twice daily -Continue Cogentin 0.5 mg twice daily  On Evaluation Today: Patient is observed walking in the hallway and talking to himself.  On assessment, he remains tangential, disorganized in thought process, not easily interruptible, and grandiose.  Patient endorses eating well and sleeping well, and RN documentation shows the patient slept 7.75 hours.  Patient makes the statements that he knows how to be resurrected, as God gave it to him and he was previously murdered.  He also states that he talks out loud because [employer] and the president are always listening.  Although he denies SI/HI/AVH since he was picked up by EMS.  He denies somatic concerns and complaints today.  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; groups are not complicated."    Principal Problem: Schizoaffective disorder, bipolar type ([place]) Diagnosis: Principal Problem:   Schizoaffective disorder, bipolar type ([place]) Active Problems:   Tobacco use disorder   Total Time spent with patient: 20 minutes  Past Psychiatric History: See H&P  Past Medical History:  Past Medical History:  Diagnosis Date   Bipolar disorder ([place])    Bronchitis    Hyperlipidemia    Paranoid schizophrenia ([place])     Schizophrenia ([place])     Past Surgical History:  Procedure Laterality Date   TESTICLE IMPLANTATION TO THIGH     TESTICLE SURGERY     Family History:  Family History  Problem Relation Age of Onset   Schizophrenia Neg Hx    Family Psychiatric  History: See H&P Social History:  Social History   Substance and Sexual Activity  Alcohol Use Not Currently   Comment: weekly     Social History   Substance and Sexual Activity  Drug Use Yes   Types: Marijuana   Comment: smokes hemp    Social History   Socioeconomic History   Marital status: Single    Spouse name: Not on file   Number of children: Not on file   Years of education: 12 years   Highest education level: 12th grade  Occupational History   Occupation: On disability  Tobacco Use   Smoking status: Every Day    Packs/day: 1.00    Types: Cigarettes   Smokeless tobacco: Never  Vaping Use   Vaping Use: Never used  Substance and Sexual Activity   Alcohol use: Not Currently    Comment: weekly   Drug use: Yes    Types: Marijuana    Comment: smokes hemp   Sexual activity: Yes    Birth control/protection: Condom  Other Topics Concern   Not on file  Social History Narrative   Pt lives alone in apartment complex for mentally ill   Social Determinants of Health   Financial Resource Strain: Not on file  Food Insecurity: Not  on file  Transportation Needs: Not on file  Physical Activity: Not on file  Stress: Not on file  Social Connections: Not on file   Additional Social History:       Sleep: Poor  Appetite:  Good  Current Medications: Current Facility-Administered Medications  Medication Dose Route Frequency Provider Last Rate Last Admin   alum & mag hydroxide-simeth (MAALOX/MYLANTA) 200-200-20 MG/5ML suspension 30 mL  30 mL Oral Q4H PRN Bobbitt, Shalon E, NP   30 mL at 12/11/21 1819   ARIPiprazole (ABILIFY) tablet 20 mg  20 mg Oral Daily Lamar Sprinkles, MD   20 mg at 12/13/21 0820   ARIPiprazole ER  (ABILIFY MAINTENA) injection 400 mg  400 mg Intramuscular Q28 days Massengill, Harrold Donath, MD   400 mg at 12/12/21 0935   atorvastatin (LIPITOR) tablet 40 mg  40 mg Oral QHS Bobbitt, Shalon E, NP   40 mg at 12/12/21 2035   benztropine (COGENTIN) tablet 0.5 mg  0.5 mg Oral BID Bobbitt, Shalon E, NP   0.5 mg at 12/13/21 0820   divalproex (DEPAKOTE) DR tablet 750 mg  750 mg Oral Q12H Bobbitt, Shalon E, NP   750 mg at 12/13/21 6333   empagliflozin (JARDIANCE) tablet 10 mg  10 mg Oral Daily Lamar Sprinkles, MD   10 mg at 12/13/21 5456   hydrOXYzine (ATARAX) tablet 25 mg  25 mg Oral TID PRN Bobbitt, Shalon E, NP   25 mg at 12/12/21 2035   ibuprofen (ADVIL) tablet 800 mg  800 mg Oral BID PRN Bobbitt, Shalon E, NP   800 mg at 12/12/21 1201   magnesium hydroxide (MILK OF MAGNESIA) suspension 30 mL  30 mL Oral Daily PRN Bobbitt, Shalon E, NP   30 mL at 12/10/21 2029   melatonin tablet 5 mg  5 mg Oral QHS PRN Massengill, Harrold Donath, MD   5 mg at 12/11/21 2049   metFORMIN (GLUCOPHAGE) tablet 1,000 mg  1,000 mg Oral BID WC Bobbitt, Shalon E, NP   1,000 mg at 12/13/21 0820   nicotine (NICODERM CQ - dosed in mg/24 hours) patch 21 mg  21 mg Transdermal Daily Lamar Sprinkles, MD   21 mg at 12/13/21 0824   OLANZapine (ZYPREXA) tablet 10 mg  10 mg Oral Daily Lamar Sprinkles, MD   10 mg at 12/13/21 0820   OLANZapine (ZYPREXA) tablet 20 mg  20 mg Oral QHS Lamar Sprinkles, MD   20 mg at 12/12/21 2035   OLANZapine zydis (ZYPREXA) disintegrating tablet 10 mg  10 mg Oral Q8H PRN Bobbitt, Shalon E, NP   10 mg at 12/12/21 0034   And   ziprasidone (GEODON) injection 20 mg  20 mg Intramuscular PRN Bobbitt, Shalon E, NP       sodium chloride (OCEAN) 0.65 % nasal spray 1 spray  1 spray Each Nare PRN Lamar Sprinkles, MD   1 spray at 12/12/21 2037   temazepam (RESTORIL) capsule 15 mg  15 mg Oral QHS PRN Lamar Sprinkles, MD   15 mg at 12/12/21 2036   And   temazepam (RESTORIL) capsule 15 mg  15 mg Oral QHS PRN Lamar Sprinkles, MD   15 mg  at 12/12/21 2035   traZODone (DESYREL) tablet 50 mg  50 mg Oral QHS PRN Bobbitt, Shalon E, NP   50 mg at 12/11/21 2048    Lab Results:  Results for orders placed or performed during the hospital encounter of 12/07/21 (from the past 48 hour(s))  Glucose, capillary  Status: None   Collection Time: 12/12/21  5:50 AM  Result Value Ref Range   Glucose-Capillary 83 70 - 99 mg/dL    Comment: Glucose reference range applies only to samples taken after fasting for at least 8 hours.  Glucose, capillary     Status: Abnormal   Collection Time: 12/13/21  6:01 AM  Result Value Ref Range   Glucose-Capillary 101 (H) 70 - 99 mg/dL    Comment: Glucose reference range applies only to samples taken after fasting for at least 8 hours.   Comment 1 Notify RN    Comment 2 Document in Chart     Blood Alcohol level:  Lab Results  Component Value Date   ETH <10 11/28/2021   ETH <10 [id number]    Metabolic Disorder Labs: Lab Results  Component Value Date   HGBA1C 6.9 (H) 11/17/2021   MPG 151.33 11/17/2021   MPG 111.15 02/03/2020   Lab Results  Component Value Date   PROLACTIN 8.1 06/01/2019   Lab Results  Component Value Date   CHOL 130 12/01/2021   TRIG 172 (H) 12/01/2021   HDL 28 (L) 12/01/2021   CHOLHDL 4.6 12/01/2021   VLDL 34 12/01/2021   LDLCALC 68 12/01/2021   LDLCALC 97 02/03/2020    Physical Findings:  Musculoskeletal: Strength & Muscle Tone: within normal limits Gait & Station: normal Patient leans: N/A  Psychiatric Specialty Exam:  Presentation  General Appearance: Appropriate for Environment; Casual   Eye Contact:Good   Speech:Clear and Coherent; Normal Rate (Verbose, not easily interruptible)   Speech Volume:Normal   Handedness:Right    Mood and Affect  Mood:Euthymic   Affect:Blunt; Congruent    Thought Process  Thought Processes:Disorganized   Descriptions of Associations:Tangential   Orientation:Full (Time, Place and Person)   Thought  Content:Scattered; Tangential; Illogical   History of Schizophrenia/Schizoaffective disorder:Yes   Duration of Psychotic Symptoms:Greater than six months   Hallucinations:Hallucinations: None   Ideas of Reference:None   Suicidal Thoughts:Suicidal Thoughts: No   Homicidal Thoughts:Homicidal Thoughts: No    Sensorium  Memory:Immediate Fair; Recent Fair   Judgment:Impaired   Insight:Poor; Shallow    Executive Functions  Concentration:Fair   Attention Span:Fair   [place]    Psychomotor Activity  Psychomotor Activity:Psychomotor Activity: Normal    Assets  Assets:Desire for Improvement; Housing; Resilience; Social Support    Sleep  Sleep:Sleep: Good Number of Hours of Sleep: 7.75     Physical Exam: Physical Exam Vitals reviewed.  Constitutional:      General: He is not in acute distress.    Appearance: He is not toxic-appearing.  HENT:     Head: Normocephalic and atraumatic.     Mouth/Throat:     Mouth: Mucous membranes are moist.     Pharynx: Oropharynx is clear.  Pulmonary:     Effort: Pulmonary effort is normal.  Skin:    General: Skin is warm and dry.  Neurological:     Mental Status: He is alert.     Motor: No weakness.     Gait: Gait normal.    Review of Systems  Cardiovascular:  Negative for chest pain.  Gastrointestinal: Negative.   Genitourinary: Negative.   Musculoskeletal: Negative.   Neurological:  Negative for dizziness, tremors and headaches.   Blood pressure 99/79, pulse 98, temperature 97.7 F (36.5 C), temperature source Oral, resp. rate 18, height 6\' 1"  (1.854 m), weight 117.9 kg, SpO2 100 %. Body mass index is 34.3 kg/m.  Treatment Plan Summary: ASSESSMENT: Principal Problem:   Schizoaffective disorder, bipolar type (HCC) Active Problems:   Tobacco use disorder   Patient appears to have minimal improvements on current regimen, which leads [place] to believe  that although he picked up his medications on time, he has not been compliant with taking them. ACT team confirmed that patient has not had LAI per the records since 6/1.  Concerns that since beginning Abilify, patient has not been improving; will continue to monitor and determine if need to adjust antipsychotic regimen.   PLAN: Safety and Monitoring:             -- (In)Voluntary admission to inpatient psychiatric unit for safety, stabilization and treatment             -- Daily contact with patient to assess and evaluate symptoms and progress in treatment             -- Patient's case to be discussed in multi-disciplinary team meeting             -- Observation Level : q15 minute checks             -- Vital signs:  q12 hours             -- Precautions: suicide, elopement, and assault   2. Psychiatric Diagnoses and Treatment:  # Schizoaffective disorder-bipolar type - Continue Zyprexa 10 mg every morning and 20 mg nightly.  -- Patient currently on Abilify Maintena 400 mg LAI, last known LAI 6/1. Patient to receive LAI today, and will continue PO bridge x 7 days post-LAI.  Increased to Abilify 20 mg today. - Continue Depakote ER 750 mg twice daily             -VPA level 63 (prev 65), LFTs WNL, Plt 364 -Continue Cogentin 0.5 mg twice daily   PRN: Agitation protocol with Zyprexa 10 mg, Geodon 20 mg, and Ativan 1 mg   -- The risks/benefits/side-effects/alternatives to this medication were discussed in detail with the patient and time was given for questions. The patient consents to medication trial.              -- Metabolic profile and EKG monitoring obtained while on an atypical antipsychotic  BMI: 34.3 Lipid Panel: TG 172 HbgA1c: 6.9% QTc:              -- Encouraged patient to participate in unit milieu and in scheduled group therapies      3. Medical Issues Being Addressed: #Nicotine use disorder - Nicotine patch 21 mg daily   #T2DM A.m. CBG 101. Home medication Synjardi 01/999 mg  BID.  Due to the fact that patient is actively psychotic, only completing a.m. sleep e.g. monitoring.  Patient on home regimen and diabetes well-controlled (A1c 6.9%), so minimal concern for decompensation. -Metformin 1000 mg twice daily with meals - Jardiance 10 mg every morning - A.m. CBG monitoring only. SSI was implemented and discontinued.   #Hyperlipidemia - Continue home atorvastatin 40 mg daily  4. Discharge Planning:               -- Social work and case management to assist with discharge planning and identification of hospital follow-up needs prior to discharge             -- Estimated LOS: 5-7 days             -- Discharge Concerns: Need to establish a safety plan; Medication compliance and effectiveness             --  Discharge Goals: Return home with outpatient referrals for mental health follow-up including medication management/psychotherapy   Rosezetta Schlatter, MD 12/13/2021, 12:29 PM

## 2021-12-13 NOTE — Progress Notes (Signed)
   12/13/21 0515  Sleep  Number of Hours 7.75

## 2021-12-13 NOTE — Progress Notes (Signed)
[facility] Group Notes:  (Nursing/MHT/Case Management/Adjunct)  Date:  12/13/2021  Time:  2000  Type of Therapy:   wrap up group  Participation Level:  Active  Participation Quality:  Attentive, Monopolizing, and Redirectable  Affect:  Excited  Cognitive:  Delusional and Grandiose  Insight:  Lacking  Engagement in Group:  Engaged  Modes of Intervention:  Clarification, Education, and Socialization  Summary of Progress/Problems: Positive thinking and self-care were discussed.   Marcille Buffy 12/13/2021, 8:30 PM

## 2021-12-14 ENCOUNTER — Encounter (HOSPITAL_COMMUNITY): Payer: Self-pay

## 2021-12-14 DIAGNOSIS — E119 Type 2 diabetes mellitus without complications: Secondary | ICD-10-CM | POA: Diagnosis not present

## 2021-12-14 DIAGNOSIS — F250 Schizoaffective disorder, bipolar type: Secondary | ICD-10-CM | POA: Diagnosis not present

## 2021-12-14 DIAGNOSIS — F17200 Nicotine dependence, unspecified, uncomplicated: Secondary | ICD-10-CM | POA: Diagnosis not present

## 2021-12-14 LAB — GLUCOSE, CAPILLARY: Glucose-Capillary: 85 mg/dL (ref 70–99)

## 2021-12-14 LAB — AMMONIA: Ammonia: 43 umol/L — ABNORMAL HIGH (ref 9–35)

## 2021-12-14 MED ORDER — ARIPIPRAZOLE 15 MG PO TABS
30.0000 mg | ORAL_TABLET | Freq: Every day | ORAL | Status: DC
  Filled 2021-12-14: qty 2

## 2021-12-14 MED ORDER — WHITE PETROLATUM EX OINT
TOPICAL_OINTMENT | CUTANEOUS | Status: AC
  Filled 2021-12-14: qty 5

## 2021-12-14 MED ORDER — TEMAZEPAM 15 MG PO CAPS
30.0000 mg | ORAL_CAPSULE | Freq: Every evening | ORAL | Status: DC | PRN
Start: 2021-12-14 — End: 2021-12-14

## 2021-12-14 MED ORDER — ARIPIPRAZOLE 15 MG PO TABS
25.0000 mg | ORAL_TABLET | Freq: Every day | ORAL | Status: DC
  Administered 2021-12-15 – 2021-12-17 (×3): 25 mg via ORAL
  Filled 2021-12-14 (×6): qty 1

## 2021-12-14 MED ORDER — WHITE PETROLATUM EX OINT
TOPICAL_OINTMENT | CUTANEOUS | Status: AC
  Administered 2021-12-14: 1
  Filled 2021-12-14: qty 5

## 2021-12-14 MED ORDER — OLANZAPINE 5 MG PO TBDP
5.0000 mg | ORAL_TABLET | Freq: Three times a day (TID) | ORAL | Status: DC | PRN
  Administered 2021-12-19 – 2021-12-21 (×4): 5 mg via ORAL
  Filled 2021-12-14 (×5): qty 1

## 2021-12-14 MED ORDER — TEMAZEPAM 15 MG PO CAPS
15.0000 mg | ORAL_CAPSULE | Freq: Every evening | ORAL | Status: DC | PRN
  Administered 2021-12-14 – 2021-12-15 (×2): 15 mg via ORAL
  Filled 2021-12-14 (×2): qty 1

## 2021-12-14 MED ORDER — ZIPRASIDONE MESYLATE 20 MG IM SOLR: 20.0000 mg | INTRAMUSCULAR | Status: DC | PRN

## 2021-12-14 MED ORDER — DIVALPROEX SODIUM 500 MG PO DR TAB
500.0000 mg | DELAYED_RELEASE_TABLET | Freq: Two times a day (BID) | ORAL | Status: DC
  Administered 2021-12-14 – 2021-12-19 (×10): 500 mg via ORAL
  Filled 2021-12-14 (×14): qty 1

## 2021-12-14 NOTE — Progress Notes (Signed)
Adult Psychoeducational Group Note  Date:  12/14/2021 Time:  8:35 PM  Group Topic/Focus:  Wrap-Up Group:   The focus of this group is to help patients review their daily goal of treatment and discuss progress on daily workbooks.  Participation Level:  Active  Participation Quality:  Appropriate  Affect:  Appropriate  Cognitive:  Appropriate  Insight: Appropriate  Engagement in Group:  Engaged  Modes of Intervention:  Discussion  Additional Comments:   Pt actively participated in the Wrap Up group. Pt rated his day and mood 10/10. Pt identified visitation with his dad as a positive that helped improve his mood and coping.   [name] 12/14/2021, 8:35 PM

## 2021-12-14 NOTE — BH IP Treatment Plan (Unsigned)
Interdisciplinary Treatment and Diagnostic Plan Update  12/14/2021 Time of Session: 10:15am  [name] MRN: [id number]  Principal Diagnosis: Schizoaffective disorder, bipolar type [facility])  Secondary Diagnoses: Principal Problem:   Schizoaffective disorder, bipolar type (HCC) Active Problems:   Tobacco use disorder   Current Medications:  Current Facility-Administered Medications  Medication Dose Route Frequency Provider Last Rate Last Admin   alum & mag hydroxide-simeth (MAALOX/MYLANTA) 200-200-20 MG/5ML suspension 30 mL  30 mL Oral Q4H PRN Bobbitt, Shalon E, NP   30 mL at 12/11/21 1819   ARIPiprazole (ABILIFY) tablet 20 mg  20 mg Oral Daily Lamar Sprinkles, MD   20 mg at 12/14/21 0736   ARIPiprazole ER (ABILIFY MAINTENA) injection 400 mg  400 mg Intramuscular Q28 days Massengill, Harrold Donath, MD   400 mg at 12/12/21 0935   atorvastatin (LIPITOR) tablet 40 mg  40 mg Oral QHS Bobbitt, Shalon E, NP   40 mg at 12/13/21 2048   divalproex (DEPAKOTE) DR tablet 500 mg  500 mg Oral Q12H Massengill, Nathan, MD       empagliflozin (JARDIANCE) tablet 10 mg  10 mg Oral Daily Lamar Sprinkles, MD   10 mg at 12/14/21 0736   hydrOXYzine (ATARAX) tablet 25 mg  25 mg Oral TID PRN Bobbitt, Shalon E, NP   25 mg at 12/13/21 2048   ibuprofen (ADVIL) tablet 800 mg  800 mg Oral BID PRN Bobbitt, Shalon E, NP   800 mg at 12/13/21 1717   magnesium hydroxide (MILK OF MAGNESIA) suspension 30 mL  30 mL Oral Daily PRN Bobbitt, Shalon E, NP   30 mL at 12/10/21 2029   melatonin tablet 5 mg  5 mg Oral QHS PRN Massengill, Harrold Donath, MD   5 mg at 12/11/21 2049   metFORMIN (GLUCOPHAGE) tablet 1,000 mg  1,000 mg Oral BID WC Bobbitt, Shalon E, NP   1,000 mg at 12/14/21 0736   nicotine (NICODERM CQ - dosed in mg/24 hours) patch 21 mg  21 mg Transdermal Daily Lamar Sprinkles, MD   21 mg at 12/14/21 0738   OLANZapine (ZYPREXA) tablet 10 mg  10 mg Oral Daily Lamar Sprinkles, MD   10 mg at 12/14/21 0736   OLANZapine (ZYPREXA) tablet 20  mg  20 mg Oral QHS Lamar Sprinkles, MD   20 mg at 12/13/21 2048   OLANZapine zydis (ZYPREXA) disintegrating tablet 10 mg  10 mg Oral Q8H PRN Bobbitt, Shalon E, NP   10 mg at 12/12/21 0034   And   ziprasidone (GEODON) injection 20 mg  20 mg Intramuscular PRN Bobbitt, Shalon E, NP       sodium chloride (OCEAN) 0.65 % nasal spray 1 spray  1 spray Each Nare PRN Lamar Sprinkles, MD   1 spray at 12/12/21 2037   temazepam (RESTORIL) capsule 30 mg  30 mg Oral QHS PRN Massengill, Harrold Donath, MD       traZODone (DESYREL) tablet 50 mg  50 mg Oral QHS PRN Bobbitt, Shalon E, NP   50 mg at 12/11/21 2048   PTA Medications: Medications Prior to Admission  Medication Sig Dispense Refill Last Dose   ABILIFY MAINTENA 400 MG PRSY prefilled syringe Inject 400 mg into the muscle every 28 (twenty-eight) days.      atorvastatin (LIPITOR) 40 MG tablet Take 40 mg by mouth at bedtime.      ibuprofen (ADVIL) 800 MG tablet Take 800 mg by mouth 2 (two) times daily as needed for fever, mild pain or headache.  metFORMIN (GLUCOPHAGE) 500 MG tablet Take 2 tablets (1,000 mg total) by mouth 2 (two) times daily with a meal. 60 tablet 0    naproxen (NAPROSYN) 500 MG tablet Take 1 tablet (500 mg total) by mouth 2 (two) times daily. 10 tablet 0     Patient Stressors: Medication change or noncompliance    Patient Strengths: Capable of independent living  Motivation for treatment/growth  Supportive family/friends   Treatment Modalities: Medication Management, Group therapy, Case management,  1 to 1 session with clinician, Psychoeducation, Recreational therapy.   Physician Treatment Plan for Primary Diagnosis: Schizoaffective disorder, bipolar type (HCC) Long Term Goal(s): Improvement in symptoms so as ready for discharge   Short Term Goals: Ability to identify changes in lifestyle to reduce recurrence of condition will improve Ability to verbalize feelings will improve Ability to demonstrate self-control will  improve Ability to identify and develop effective coping behaviors will improve Compliance with prescribed medications will improve  Medication Management: Evaluate patient's response, side effects, and tolerance of medication regimen.  Therapeutic Interventions: 1 to 1 sessions, Unit Group sessions and Medication administration.  Evaluation of Outcomes: Progressing  Physician Treatment Plan for Secondary Diagnosis: Principal Problem:   Schizoaffective disorder, bipolar type (HCC) Active Problems:   Tobacco use disorder  Long Term Goal(s): Improvement in symptoms so as ready for discharge   Short Term Goals: Ability to identify changes in lifestyle to reduce recurrence of condition will improve Ability to verbalize feelings will improve Ability to demonstrate self-control will improve Ability to identify and develop effective coping behaviors will improve Compliance with prescribed medications will improve     Medication Management: Evaluate patient's response, side effects, and tolerance of medication regimen.  Therapeutic Interventions: 1 to 1 sessions, Unit Group sessions and Medication administration.  Evaluation of Outcomes: Progressing   RN Treatment Plan for Primary Diagnosis: Schizoaffective disorder, bipolar type (HCC) Long Term Goal(s): Knowledge of disease and therapeutic regimen to maintain health will improve  Short Term Goals: Ability to remain free from injury will improve, Ability to participate in decision making will improve, Ability to verbalize feelings will improve, Ability to disclose and discuss suicidal ideas, and Ability to identify and develop effective coping behaviors will improve  Medication Management: RN will administer medications as ordered by provider, will assess and evaluate patient's response and provide education to patient for prescribed medication. RN will report any adverse and/or side effects to prescribing provider.  Therapeutic  Interventions: 1 on 1 counseling sessions, Psychoeducation, Medication administration, Evaluate responses to treatment, Monitor vital signs and CBGs as ordered, Perform/monitor CIWA, COWS, AIMS and Fall Risk screenings as ordered, Perform wound care treatments as ordered.  Evaluation of Outcomes: Progressing   LCSW Treatment Plan for Primary Diagnosis: Schizoaffective disorder, bipolar type (HCC) Long Term Goal(s): Safe transition to appropriate next level of care at discharge, Engage patient in therapeutic group addressing interpersonal concerns.  Short Term Goals: Engage patient in aftercare planning with referrals and resources, Increase social support, Increase emotional regulation, Facilitate acceptance of mental health diagnosis and concerns, Identify triggers associated with mental health/substance abuse issues, and Increase skills for wellness and recovery  Therapeutic Interventions: Assess for all discharge needs, 1 to 1 time with Social worker, Explore available resources and support systems, Assess for adequacy in community support network, Educate family and significant other(s) on suicide prevention, Complete Psychosocial Assessment, Interpersonal group therapy.  Evaluation of Outcomes: Progressing   Progress in Treatment: Attending groups: Yes. Participating in groups: Yes. Taking medication as prescribed: Yes.  Toleration medication: Yes. Family/Significant other contact made: Yes, will contact:  Father  Patient understands diagnosis: Yes. Discussing patient identified problems/goals with staff: Yes. Medical problems stabilized or resolved: Yes. Denies suicidal/homicidal ideation: No. Issues/concerns per patient self-inventory: Yes. Other: none   New problem(s) identified: No, Describe:  none   New Short Term/Long Term Goal(s): Patient to work towards elimination of symptoms of psychosis, medication management for mood stabilization; elimination of SI thoughts; development  of comprehensive mental wellness plan.   Patient Goals: Patient states their goal for treatment is to "make sure medications are the same."   Discharge Plan or Barriers: No psychosocial barriers identified at this time, patient to return to place of residence when appropriate for discharge.    Reason for Continuation of Hospitalization: Other; describe psychosis   Estimated Length of Stay: 1-7 days     Last 3 [place] Suicide Severity Risk Score: [place] Admission (Current) from 12/07/2021 in [place] [facility] ED from 11/29/2021 in [place] ED from 11/28/2021 in [place] No Risk No Risk No Risk       Last PHQ 2/9 Scores:    11/02/2021    6:10 PM  Depression screen PHQ 2/9  Decreased Interest 1  Down, Depressed, Hopeless 0  PHQ - 2 Score 1    Scribe for Treatment Team: Darleen Crocker, Latanya Presser 12/14/2021 10:54 AM

## 2021-12-14 NOTE — Progress Notes (Signed)
   12/14/21 1500  Psych Admission Type (Psych Patients Only)  Admission Status Involuntary  Psychosocial Assessment  Patient Complaints Suspiciousness  Eye Contact Fair  Facial Expression Animated  Affect Appropriate to circumstance  Speech Tangential  Interaction Assertive  Motor Activity Fidgety;Pacing  Appearance/Hygiene Improved  Behavior Characteristics Cooperative  Mood Preoccupied  Aggressive Behavior  Effect No apparent injury  Thought Process  Coherency Disorganized  Content Delusions  Delusions Paranoid  Perception Hallucinations  Hallucination Auditory;Visual  Judgment Limited  Confusion None  Danger to Self  Current suicidal ideation? Denies  Danger to Others  Danger to Others None reported or observed

## 2021-12-14 NOTE — Progress Notes (Signed)
   12/14/21 0500  Sleep  Number of Hours 5.5    

## 2021-12-14 NOTE — [facility] Group Notes (Signed)
Spirituality group facilitated by Kathleen Argue, BCC.   Group Description: Group focused on topic of hope. Patients participated in facilitated discussion around topic, connecting with one another around experiences and definitions for hope. Group members engaged with visual explorer photos, reflecting on what hope looks like for them today. Group engaged in discussion around how their definitions of hope are present today in hospital.   Modalities: Psycho-social ed, Adlerian, Narrative, MI   Patient Progress: [name] attended group and participated in conversation.  His comments were often tangential and it was difficult to follow his train of thought.  At times he monopolized the conversation, but was able to be redirected.  [address], Bcc Pager, [phone]

## 2021-12-14 NOTE — Progress Notes (Signed)
   12/14/21 2000  Psych Admission Type (Psych Patients Only)  Admission Status Involuntary  Psychosocial Assessment  Patient Complaints Anxiety  Eye Contact Fair  Facial Expression Animated  Affect Anxious;Appropriate to circumstance  Speech Tangential  Interaction Assertive  Motor Activity Pacing  Appearance/Hygiene Disheveled  Behavior Characteristics Cooperative  Mood Preoccupied;Pleasant  Aggressive Behavior  Effect No apparent injury  Thought Process  Coherency Disorganized  Content Delusions  Delusions Paranoid  Perception Hallucinations  Hallucination Auditory;Visual  Judgment Limited  Confusion None  Danger to Self  Current suicidal ideation? Denies  Danger to Others  Danger to Others None reported or observed

## 2021-12-14 NOTE — Progress Notes (Signed)
Pt up having hard time falling asleep, pt back and forth to the bathroom, pt historically has taken a lot of medication to sleep at night

## 2021-12-14 NOTE — Progress Notes (Signed)
[facility] MD Progress Note  12/14/2021 12:13 PM [name]  MRN:  [id number] Subjective:  [name] is a 46 year old male with a reported past psychiatric history of schizophrenia-paranoid, bipolar disorder housing insecurity who presented to [facility] ED on 8/3 under IVC for psychosis and bizarre behaviors.    Yesterday's Recommendations per Psychiatry Team: - Continue Zyprexa 10 mg every morning and 20 mg nightly.  -- Patient currently on Abilify Maintena 400 mg LAI, last known LAI 6/1. Will continue PO bridge for atleast 7 days post-LAI.   - Increase Abilify 20 mg once daily. - Continue Depakote ER 750 mg twice daily             -VPA level 63 (prev 65), LFTs WNL, Plt 364 -Continue Cogentin 0.5 mg twice daily    On Evaluation Today:  On evaluation today, the patient continues to be disorganized and tangential.  He is clearly psychotic.  He continues to think that his mother is breaking into his apartment, cable company has hacked his Internet.  On my evaluation he does not appear to be akathetic.  Mood is okay.  Appetite is okay.  Sleep was 5.5 hours last night, which is an improvement from days prior.  I discussed with the patient that his ammonia level was elevated and we will decrease his Depakote.  I will also decrease temazepam, if this benzodiazepine is causing the patient some confusion in addition to his psychosis.  We will increase the Abilify to 25 mg once daily.  He denies AH and VH.  He denies SI and HI.  He otherwise denies having side effects to current psychiatric medications.   Principal Problem: Schizoaffective disorder, bipolar type ([place]) Diagnosis: Principal Problem:   Schizoaffective disorder, bipolar type ([place]) Active Problems:   Tobacco use disorder   Total Time spent with patient: 20 minutes  Past Psychiatric History: See H&P  Past Medical History:  Past Medical History:  Diagnosis Date   Bipolar disorder ([place])    Bronchitis    Hyperlipidemia     Paranoid schizophrenia ([place])    Schizophrenia ([place])     Past Surgical History:  Procedure Laterality Date   TESTICLE IMPLANTATION TO THIGH     TESTICLE SURGERY     Family History:  Family History  Problem Relation Age of Onset   Schizophrenia Neg Hx    Family Psychiatric  History: See H&P Social History:  Social History   Substance and Sexual Activity  Alcohol Use Not Currently   Comment: weekly     Social History   Substance and Sexual Activity  Drug Use Yes   Types: Marijuana   Comment: smokes hemp    Social History   Socioeconomic History   Marital status: Single    Spouse name: Not on file   Number of children: Not on file   Years of education: 12 years   Highest education level: 12th grade  Occupational History   Occupation: On disability  Tobacco Use   Smoking status: Every Day    Packs/day: 1.00    Types: Cigarettes   Smokeless tobacco: Never  Vaping Use   Vaping Use: Never used  Substance and Sexual Activity   Alcohol use: Not Currently    Comment: weekly   Drug use: Yes    Types: Marijuana    Comment: smokes hemp   Sexual activity: Yes    Birth control/protection: Condom  Other Topics Concern   Not on file  Social History Narrative  Pt lives alone in apartment complex for mentally ill   Social Determinants of Health   Financial Resource Strain: Not on file  Food Insecurity: Not on file  Transportation Needs: Not on file  Physical Activity: Not on file  Stress: Not on file  Social Connections: Not on file   Additional Social History:       Sleep: Poor  Appetite:  Good  Current Medications: Current Facility-Administered Medications  Medication Dose Route Frequency Provider Last Rate Last Admin   alum & mag hydroxide-simeth (MAALOX/MYLANTA) 200-200-20 MG/5ML suspension 30 mL  30 mL Oral Q4H PRN Bobbitt, Shalon E, NP   30 mL at 12/11/21 1819   [START ON 12/15/2021] ARIPiprazole (ABILIFY) tablet 30 mg  30 mg Oral Daily Eliah Ozawa,  Silena Wyss, MD       ARIPiprazole ER (ABILIFY MAINTENA) injection 400 mg  400 mg Intramuscular Q28 days Merlyn Bollen, Harrold Donath, MD   400 mg at 12/12/21 0935   atorvastatin (LIPITOR) tablet 40 mg  40 mg Oral QHS Bobbitt, Shalon E, NP   40 mg at 12/13/21 2048   divalproex (DEPAKOTE) DR tablet 500 mg  500 mg Oral Q12H Alesa Echevarria, MD       empagliflozin (JARDIANCE) tablet 10 mg  10 mg Oral Daily Lamar Sprinkles, MD   10 mg at 12/14/21 0736   hydrOXYzine (ATARAX) tablet 25 mg  25 mg Oral TID PRN Bobbitt, Shalon E, NP   25 mg at 12/13/21 2048   ibuprofen (ADVIL) tablet 800 mg  800 mg Oral BID PRN Bobbitt, Shalon E, NP   800 mg at 12/14/21 1109   magnesium hydroxide (MILK OF MAGNESIA) suspension 30 mL  30 mL Oral Daily PRN Bobbitt, Shalon E, NP   30 mL at 12/10/21 2029   melatonin tablet 5 mg  5 mg Oral QHS PRN Denelda Akerley, Harrold Donath, MD   5 mg at 12/11/21 2049   metFORMIN (GLUCOPHAGE) tablet 1,000 mg  1,000 mg Oral BID WC Bobbitt, Shalon E, NP   1,000 mg at 12/14/21 0736   nicotine (NICODERM CQ - dosed in mg/24 hours) patch 21 mg  21 mg Transdermal Daily Lamar Sprinkles, MD   21 mg at 12/14/21 0738   OLANZapine (ZYPREXA) tablet 10 mg  10 mg Oral Daily Lamar Sprinkles, MD   10 mg at 12/14/21 0736   OLANZapine (ZYPREXA) tablet 20 mg  20 mg Oral QHS Lamar Sprinkles, MD   20 mg at 12/13/21 2048   OLANZapine zydis (ZYPREXA) disintegrating tablet 5 mg  5 mg Oral Q8H PRN Alakai Macbride, Harrold Donath, MD       And   ziprasidone (GEODON) injection 20 mg  20 mg Intramuscular PRN Jeriann Sayres, Harrold Donath, MD       sodium chloride (OCEAN) 0.65 % nasal spray 1 spray  1 spray Each Nare PRN Lamar Sprinkles, MD   1 spray at 12/12/21 2037   temazepam (RESTORIL) capsule 15 mg  15 mg Oral QHS PRN Abbi Mancini, Harrold Donath, MD       traZODone (DESYREL) tablet 50 mg  50 mg Oral QHS PRN Bobbitt, Shalon E, NP   50 mg at 12/11/21 2048    Lab Results:  Results for orders placed or performed during the hospital encounter of 12/07/21 (from the past 48  hour(s))  Glucose, capillary     Status: Abnormal   Collection Time: 12/13/21  6:01 AM  Result Value Ref Range   Glucose-Capillary 101 (H) 70 - 99 mg/dL    Comment: Glucose reference range applies only to  samples taken after fasting for at least 8 hours.   Comment 1 Notify RN    Comment 2 Document in Chart   Ammonia     Status: Abnormal   Collection Time: 12/14/21  6:41 AM  Result Value Ref Range   Ammonia 43 (H) 9 - 35 umol/L    Comment: Performed at [facility], [place] [address], [place], [place] [address]    Blood Alcohol level:  Lab Results  Component Value Date   ETH <10 11/28/2021   ETH <10 [id number]    Metabolic Disorder Labs: Lab Results  Component Value Date   HGBA1C 6.9 (H) 11/17/2021   MPG 151.33 11/17/2021   MPG 111.15 02/03/2020   Lab Results  Component Value Date   PROLACTIN 8.1 06/01/2019   Lab Results  Component Value Date   CHOL 130 12/01/2021   TRIG 172 (H) 12/01/2021   HDL 28 (L) 12/01/2021   CHOLHDL 4.6 12/01/2021   VLDL 34 12/01/2021   LDLCALC 68 12/01/2021   LDLCALC 97 02/03/2020    Physical Findings:  Musculoskeletal: Strength & Muscle Tone: within normal limits Gait & Station: normal Patient leans: N/A  Psychiatric Specialty Exam:  Presentation  General Appearance: Appropriate for Environment; Casual   Eye Contact:Good   Speech:Clear and Coherent; Normal Rate (Verbose, not easily interruptible)   Speech Volume:Normal   Handedness:Right    Mood and Affect  Mood:Euthymic   Affect:Blunt; Congruent    Thought Process  Thought Processes:Disorganized   Descriptions of Associations:Tangential   Orientation:Full (Time, Place and Person)   Thought Content:Scattered; Tangential; Illogical PARANOID  History of Schizophrenia/Schizoaffective disorder:Yes   Duration of Psychotic Symptoms:Greater than six months   Hallucinations:Hallucinations: None   Ideas of Reference:None   Suicidal  Thoughts:Suicidal Thoughts: No   Homicidal Thoughts:Homicidal Thoughts: No    Sensorium  Memory:Immediate Fair; Recent Fair   Judgment:Impaired   Insight:Poor; Shallow    Executive Functions  Concentration: Poor   Attention Span: Poor   Recall:Fair   Fund of Knowledge:Fair   Language:Fair    Psychomotor Activity  Psychomotor Activity:Psychomotor Activity: Normal    Assets  Assets:Desire for Improvement; Housing; Resilience; Social Support    Sleep  Sleep:Sleep: Better Number of Hours of Sleep: 5.5     Physical Exam: Physical Exam Vitals reviewed.  Constitutional:      General: He is not in acute distress.    Appearance: He is normal weight. He is not toxic-appearing.  HENT:     Head: Normocephalic and atraumatic.     Mouth/Throat:     Mouth: Mucous membranes are moist.     Pharynx: Oropharynx is clear.  Pulmonary:     Effort: Pulmonary effort is normal. No respiratory distress.  Skin:    General: Skin is warm and dry.  Neurological:     Mental Status: He is alert.     Motor: No weakness.     Gait: Gait normal.  Psychiatric:        Mood and Affect: Mood normal.        Behavior: Behavior normal.        Judgment: Judgment normal.    Review of Systems  Constitutional:  Negative for chills and fever.  Cardiovascular:  Negative for chest pain and palpitations.  Gastrointestinal: Negative.   Genitourinary: Negative.   Musculoskeletal: Negative.   Neurological:  Negative for dizziness, tingling, tremors and headaches.  Psychiatric/Behavioral:  Negative for depression, hallucinations, memory loss, substance abuse and suicidal ideas. The patient is  not nervous/anxious and does not have insomnia.    Blood pressure 124/77, pulse 97, temperature 98.1 F (36.7 C), temperature source Oral, resp. rate 20, height 6\' 1"  (1.854 m), weight 117.9 kg, SpO2 99 %. Body mass index is 34.3 kg/m.   Treatment Plan Summary: ASSESSMENT: Principal  Problem:   Schizoaffective disorder, bipolar type ([place]) Active Problems:   Tobacco use disorder   Patient appears to have minimal improvements on current regimen, which leads [place] to believe that although he picked up his medications on time, he has not been compliant with taking them. ACT team confirmed that patient has not had LAI per the records since 6/1.  Concerns that since beginning Abilify, patient has not been improving; will continue to monitor and determine if need to adjust antipsychotic regimen.   PLAN: Safety and Monitoring:             -- Involuntary admission to inpatient psychiatric unit for safety, stabilization and treatment             -- Daily contact with patient to assess and evaluate symptoms and progress in treatment             -- Patient's case to be discussed in multi-disciplinary team meeting             -- Observation Level : q15 minute checks             -- Vital signs:  q12 hours             -- Precautions: suicide, elopement, and assault   2. Psychiatric Diagnoses and Treatment:  # Schizoaffective disorder-bipolar type - Continue Zyprexa 10 mg every morning and 20 mg nightly.  -- Patient currently on Abilify Maintena 400 mg LAI, last known LAI 6/1.  - Increase Abilify from 20 mg to 25 mg once daily  - Decrease Depakote to DR 500 mg bid due to elevated ammonia level    PRN: Agitation protocol with Zyprexa 10 mg, Geodon 20 mg, and Ativan 1 mg   -- The risks/benefits/side-effects/alternatives to this medication were discussed in detail with the patient and time was given for questions. The patient consents to medication trial.              -- Metabolic profile and EKG monitoring obtained while on an atypical antipsychotic  BMI: 34.3 Lipid Panel: TG 172 HbgA1c: 6.9% QTc:              -- Encouraged patient to participate in unit milieu and in scheduled group therapies      3. Medical Issues Being Addressed: #Nicotine use disorder - Nicotine patch 21 mg  daily   #T2DM A.m. CBG 101. Home medication Synjardi 01/999 mg BID.  Due to the fact that patient is actively psychotic, only completing a.m. sleep e.g. monitoring.  Patient on home regimen and diabetes well-controlled (A1c 6.9%), so minimal concern for decompensation. -Metformin 1000 mg twice daily with meals - Jardiance 10 mg every morning - A.m. CBG monitoring only. SSI was implemented and discontinued.   #Hyperlipidemia - Continue home atorvastatin 40 mg daily  4. Discharge Planning:               -- Social work and case management to assist with discharge planning and identification of hospital follow-up needs prior to discharge             -- Estimated LOS: 5-7 days             --  Discharge Concerns: Need to establish a safety plan; Medication compliance and effectiveness             -- Discharge Goals: Return home with outpatient referrals for mental health follow-up including medication management/psychotherapy   Cristy Hilts, MD 12/14/2021, 12:13 PM   Total Time Spent in Direct Patient Care:  I personally spent 35 minutes on the unit in direct patient care. The direct patient care time included face-to-face time with the patient, reviewing the patient's chart, communicating with other professionals, and coordinating care. Greater than 50% of this time was spent in counseling or coordinating care with the patient regarding goals of hospitalization, psycho-education, and discharge planning needs.   Phineas Inches, MD Psychiatrist

## 2021-12-14 NOTE — Group Note (Signed)
LCSW Group Therapy Note   Group Date: 09/12/2021 Start Time: 1300 End Time: 1400   Type of Therapy and Topic:  Group Therapy: Wise Mind  Participation Level:  Minimal  Description of Group: Group members were presented with topic about wise mind, reasonable mind, and emotional mind.  Group members asked to identify situations were they used their wise mind, reasonable mind and emotional mind.  Members asked to identify how behaviors affected them after using parts of their mind and identified alternative behaviors they can use when they are in crisis.    Therapeutic Goals:  1. Patients will identify consequences of using emotional mind and rational mind.  2. Patients will engage in discussion on how they cope when they are in crisis 3.  Patients will discuss coping mechanisms to engage their wise mind     Summary of Patient Progress:    Patient participated minimally in group. Patient has poor insight into group topic and spoke off topic and tangentially.   Therapeutic Modalities:  DBT Solution focused therapy    Gardiner Sleeper Jiovany Scheffel, LCSW 09/12/2021  1:41 PM

## 2021-12-14 NOTE — B[facility] Group Notes (Signed)
Adult Psychoeducational Group Note  Date:  12/14/2021 Time:  9:04 AM  Group Topic/Focus:  Goals Group:   The focus of this group is to help patients establish daily goals to achieve during treatment and discuss how the patient can incorporate goal setting into their daily lives to aide in recovery.  Participation Level:  Active  Participation Quality:  Attentive  Affect:  Appropriate  Cognitive:  Appropriate  Insight: Appropriate  Engagement in Group:  Engaged  Modes of Intervention:  Discussion  Additional Comments:  Pt has a goal today of starting a discharge plan in the treatment team meeting as well as call his family for more clothes.   [name] 12/14/2021, 9:04 AM

## 2021-12-15 DIAGNOSIS — E119 Type 2 diabetes mellitus without complications: Secondary | ICD-10-CM

## 2021-12-15 DIAGNOSIS — E785 Hyperlipidemia, unspecified: Secondary | ICD-10-CM

## 2021-12-15 DIAGNOSIS — F250 Schizoaffective disorder, bipolar type: Secondary | ICD-10-CM | POA: Diagnosis not present

## 2021-12-15 LAB — GLUCOSE, CAPILLARY: Glucose-Capillary: 116 mg/dL — ABNORMAL HIGH (ref 70–99)

## 2021-12-15 MED ORDER — BENZTROPINE MESYLATE 1 MG PO TABS
1.0000 mg | ORAL_TABLET | Freq: Two times a day (BID) | ORAL | Status: DC | PRN
  Administered 2021-12-19: 1 mg via ORAL

## 2021-12-15 MED ORDER — WHITE PETROLATUM EX OINT
TOPICAL_OINTMENT | CUTANEOUS | Status: AC
  Filled 2021-12-15: qty 15

## 2021-12-15 MED ORDER — WHITE PETROLATUM EX OINT
TOPICAL_OINTMENT | CUTANEOUS | Status: AC
  Filled 2021-12-15: qty 5

## 2021-12-15 MED ORDER — TRAZODONE HCL 100 MG PO TABS
100.0000 mg | ORAL_TABLET | Freq: Every evening | ORAL | Status: DC | PRN
  Administered 2021-12-15 – 2022-01-08 (×25): 100 mg via ORAL
  Filled 2021-12-15 (×24): qty 1

## 2021-12-15 MED ORDER — BENZTROPINE MESYLATE 1 MG/ML IJ SOLN: 1.0000 mg | Freq: Two times a day (BID) | INTRAMUSCULAR | Status: DC | PRN

## 2021-12-15 NOTE — [facility] Group Notes (Signed)
Goals Group 12/15/2021   Group Focus: affirmation, clarity of thought, and goals/reality orientation Treatment Modality:  Psychoeducation Interventions utilized were assignment, group exercise, and support Purpose: To be able to understand and verbalize the reason for their admission to the hospital. To understand that the medication helps with their chemical imbalance but they also need to work on their choices in life. To be challenged to develop a list of 30 positives about themselves. Also introduce the concept that "feelings" are not reality.  Participation Level:  Active  Participation Quality:  Appropriate  Affect:  Appropriate  Cognitive:  delusional with flight of ideas  Insight:  lacking  Engagement in Group:  left the room several times  Additional Comments:  Marland KitchenMarland KitchenRates his energy at an 8/10. Meredeth Ide in and out the room several times and then was taken out by the doctor.  Dione Housekeeper

## 2021-12-15 NOTE — Progress Notes (Signed)
   12/15/21 1000  Psych Admission Type (Psych Patients Only)  Admission Status Involuntary  Psychosocial Assessment  Patient Complaints Anxiety;Suspiciousness  Eye Contact Fair  Facial Expression Animated;Angry  Affect Labile;Irritable;Anxious  Speech Tangential  Interaction Assertive  Motor Activity Fidgety;Pacing  Appearance/Hygiene Disheveled  Behavior Characteristics Agitated;Resistant to care  Mood Anxious;Suspicious;Preoccupied  Aggressive Behavior  Effect No apparent injury  Thought Process  Coherency Disorganized  Content Delusions  Delusions Paranoid  Perception Hallucinations  Hallucination Auditory;Visual  Judgment Limited  Confusion None  Danger to Self  Current suicidal ideation? Denies  Danger to Others  Danger to Others None reported or observed

## 2021-12-15 NOTE — Progress Notes (Signed)
   12/15/21 0500  Sleep  Number of Hours 5

## 2021-12-15 NOTE — Progress Notes (Signed)
Pt refused sleep medication, pt educated on the fact he has only been sleeping 5 hrs per night, pt stated that's all he sleeps, pt was informed on a previous admission he was sleeping more than 6 hrs a night. Pt not given Melatonin this evening    12/15/21 2100  Psych Admission Type (Psych Patients Only)  Admission Status Involuntary  Psychosocial Assessment  Patient Complaints Anxiety;Worrying;Suspiciousness  Eye Contact Fair  Facial Expression Animated  Affect Anxious;Appropriate to circumstance  Consulting civil engineer Activity Pacing  Appearance/Hygiene Disheveled  Behavior Characteristics Cooperative;Resistant to care  Mood Suspicious;Anxious  Aggressive Behavior  Effect No apparent injury  Thought Process  Coherency Disorganized  Content Delusions  Delusions Paranoid  Perception Hallucinations  Hallucination Auditory;Visual  Judgment Limited  Confusion None  Danger to Self  Current suicidal ideation? Denies  Danger to Others  Danger to Others None reported or observed

## 2021-12-15 NOTE — Progress Notes (Addendum)
[facility] MD Progress Note  12/15/2021 7:29 AM RICCI DIROCCO  MRN:  [id number]  Reason for Admission:  [name] is a 46 year old male with a reported past psychiatric history of schizophrenia- paranoid, bipolar disorder housing insecurity who presented to [facility] ED on 8/3 under IVC for psychosis and bizarre behaviors.  The patient is currently on Hospital Day 8.   Chart Review from last 24 hours:  The patient's chart was reviewed and nursing notes were reviewed. The patient's case was discussed in multidisciplinary team meeting. Per nursing he was up and down and had difficulty falling asleep. He slept 5 hours.He was noted to be disheveled, pacing at times, anxious, and disorganized per staff yesterday on the unit. He did attend some groups but talked off topic and participated minimally in SW and Chaplain group. He had not acute behavioral issues noted. Per MAR he was compliant with scheduled medications and did require Vistaril X1, Advil X1, Restoril X1, and Trazodone X1 PRN. This morning, nurses state he was paranoid about additional 5mg  of Abilify which was a blue pill and he refused this Abilify 5mg  dose with his scheduled 20mg  tablet.  Yesterday's Recommendations per Psychiatry Team: - Continue Zyprexa 10 mg every morning and 20 mg nightly.  -- Patient currently on Abilify Maintena 400 mg LAI, last known LAI 6/1.  - Increase Abilify from 20 mg to 25 mg once daily  - Decrease Depakote to DR 500 mg bid due to elevated ammonia level  Information Obtained Today During Patient Interview: The patient was seen and evaluated on the unit. On assessment today the patient reports that he is afraid of blue pills and did not want the extra 5mg  of Abilify that was ordered today. Time was spent reassuring him about the medication and he is adamant he will not take a blue pill due to previous difficulty with Aleve which is the same color. He states he slept "fine" and has been eating well. He denies AVH,  thought insertion/withdrawal/broadcasting or ideas of reference but derails in discussion about how he wants to lose weight so he can look like Spiderman in the comics. He goes on tangent talking about how he wishes he had a Spiderman suit that molded to his body that made him look like he had muscles. He denies feeling paranoid on the unit and states he knows he needs to stay on his medications for his schizophrenia. He voices no physical complaints and states his constipation is resolved. He denies medication side-effects.    Principal Problem: Schizoaffective disorder, bipolar type (HCC) Diagnosis: Principal Problem:   Schizoaffective disorder, bipolar type (HCC) Active Problems:   Tobacco use disorder   Diabetes (HCC)   Hyperlipidemia   Total Time Spent in Direct Patient Care:  I personally spent 25 minutes on the unit in direct patient care. The direct patient care time included face-to-face time with the patient, reviewing the patient's chart, communicating with other professionals, and coordinating care. Greater than 50% of this time was spent in counseling or coordinating care with the patient regarding goals of hospitalization, psycho-education, and discharge planning needs.'  Past Psychiatric History: See H&P  Past Medical History:  Past Medical History:  Diagnosis Date   Bipolar disorder (HCC)    Bronchitis    Hyperlipidemia    Paranoid schizophrenia (HCC)    Schizophrenia (HCC)     Past Surgical History:  Procedure Laterality Date   TESTICLE IMPLANTATION TO THIGH     TESTICLE SURGERY  Family History:  Family History  Problem Relation Age of Onset   Schizophrenia Neg Hx    Family Psychiatric  History: See H&P  Social History:  Social History   Substance and Sexual Activity  Alcohol Use Not Currently   Comment: weekly     Social History   Substance and Sexual Activity  Drug Use Yes   Types: Marijuana   Comment: smokes hemp    Social History    Socioeconomic History   Marital status: Single    Spouse name: Not on file   Number of children: Not on file   Years of education: 12 years   Highest education level: 12th grade  Occupational History   Occupation: On disability  Tobacco Use   Smoking status: Every Day    Packs/day: 1.00    Types: Cigarettes   Smokeless tobacco: Never  Vaping Use   Vaping Use: Never used  Substance and Sexual Activity   Alcohol use: Not Currently    Comment: weekly   Drug use: Yes    Types: Marijuana    Comment: smokes hemp   Sexual activity: Yes    Birth control/protection: Condom  Other Topics Concern   Not on file  Social History Narrative   Pt lives alone in apartment complex for mentally ill   Social Determinants of Health   Financial Resource Strain: Not on file  Food Insecurity: Not on file  Transportation Needs: Not on file  Physical Activity: Not on file  Stress: Not on file  Social Connections: Not on file   Sleep: Fair - 5 hours  Appetite:  Good  Current Medications: Current Facility-Administered Medications  Medication Dose Route Frequency Provider Last Rate Last Admin   alum & mag hydroxide-simeth (MAALOX/MYLANTA) 200-200-20 MG/5ML suspension 30 mL  30 mL Oral Q4H PRN Bobbitt, Shalon E, NP   30 mL at 12/11/21 1819   ARIPiprazole (ABILIFY) tablet 25 mg  25 mg Oral Daily Massengill, Nathan, MD       ARIPiprazole ER (ABILIFY MAINTENA) injection 400 mg  400 mg Intramuscular Q28 days Massengill, Ovid Curd, MD   400 mg at 12/12/21 0935   atorvastatin (LIPITOR) tablet 40 mg  40 mg Oral QHS Bobbitt, Shalon E, NP   40 mg at 12/14/21 2045   divalproex (DEPAKOTE) DR tablet 500 mg  500 mg Oral Q12H Massengill, Nathan, MD   500 mg at 12/14/21 2044   empagliflozin (JARDIANCE) tablet 10 mg  10 mg Oral Daily Rosezetta Schlatter, MD   10 mg at 12/14/21 0736   hydrOXYzine (ATARAX) tablet 25 mg  25 mg Oral TID PRN Bobbitt, Shalon E, NP   25 mg at 12/15/21 0431   ibuprofen (ADVIL) tablet 800  mg  800 mg Oral BID PRN Bobbitt, Shalon E, NP   800 mg at 12/14/21 1109   magnesium hydroxide (MILK OF MAGNESIA) suspension 30 mL  30 mL Oral Daily PRN Bobbitt, Shalon E, NP   30 mL at 12/10/21 2029   melatonin tablet 5 mg  5 mg Oral QHS PRN Massengill, Ovid Curd, MD   5 mg at 12/11/21 2049   metFORMIN (GLUCOPHAGE) tablet 1,000 mg  1,000 mg Oral BID WC Bobbitt, Shalon E, NP   1,000 mg at 12/14/21 1812   nicotine (NICODERM CQ - dosed in mg/24 hours) patch 21 mg  21 mg Transdermal Daily Rosezetta Schlatter, MD   21 mg at 12/14/21 0738   OLANZapine (ZYPREXA) tablet 10 mg  10 mg Oral Daily Rosezetta Schlatter, MD  10 mg at 12/14/21 0736   OLANZapine (ZYPREXA) tablet 20 mg  20 mg Oral QHS Rosezetta Schlatter, MD   20 mg at 12/14/21 2045   OLANZapine zydis (ZYPREXA) disintegrating tablet 5 mg  5 mg Oral Q8H PRN Massengill, Ovid Curd, MD       And   ziprasidone (GEODON) injection 20 mg  20 mg Intramuscular PRN Massengill, Ovid Curd, MD       sodium chloride (OCEAN) 0.65 % nasal spray 1 spray  1 spray Each Nare PRN Rosezetta Schlatter, MD   1 spray at 12/12/21 2037   temazepam (RESTORIL) capsule 15 mg  15 mg Oral QHS PRN Massengill, Ovid Curd, MD   15 mg at 12/14/21 2044   traZODone (DESYREL) tablet 50 mg  50 mg Oral QHS PRN Bobbitt, Shalon E, NP   50 mg at 12/14/21 2045    Lab Results:  Results for orders placed or performed during the hospital encounter of 12/07/21 (from the past 48 hour(s))  Glucose, capillary     Status: None   Collection Time: 12/14/21  5:56 AM  Result Value Ref Range   Glucose-Capillary 85 70 - 99 mg/dL    Comment: Glucose reference range applies only to samples taken after fasting for at least 8 hours.   Comment 1 Notify RN    Comment 2 Document in Chart   Ammonia     Status: Abnormal   Collection Time: 12/14/21  6:41 AM  Result Value Ref Range   Ammonia 43 (H) 9 - 35 umol/L    Comment: Performed at [facility], [place] [address] [place], [place] [address]  Glucose, capillary      Status: Abnormal   Collection Time: 12/15/21  6:13 AM  Result Value Ref Range   Glucose-Capillary 116 (H) 70 - 99 mg/dL    Comment: Glucose reference range applies only to samples taken after fasting for at least 8 hours.    Blood Alcohol level:  Lab Results  Component Value Date   ETH <10 11/28/2021   ETH <10 [id number]    Metabolic Disorder Labs: Lab Results  Component Value Date   HGBA1C 6.9 (H) 11/17/2021   MPG 151.33 11/17/2021   MPG 111.15 02/03/2020   Lab Results  Component Value Date   PROLACTIN 8.1 06/01/2019   Lab Results  Component Value Date   CHOL 130 12/01/2021   TRIG 172 (H) 12/01/2021   HDL 28 (L) 12/01/2021   CHOLHDL 4.6 12/01/2021   VLDL 34 12/01/2021   LDLCALC 68 12/01/2021   LDLCALC 97 02/03/2020    Physical Findings:  Musculoskeletal: Strength & Muscle Tone: within normal limits Gait & Station: normal Patient leans: N/A  Psychiatric Specialty Exam:  Presentation  General Appearance: Casually dressed, fair hygiene - some stains on shirt but not malodorous; obese   Eye Contact:Fair   Speech:Clear and Coherent; Normal Rate (Verbose, not easily interruptible)   Speech Volume:Normal  Mood and Affect  Mood:Euthymic   Affect:polite, calm    Thought Process  Thought Processes:Tangential with frequent derailment and ruminations about Spiderman   Descriptions of Associations:Tangential   Orientation:Oriented to month, date, self, year and city   Thought Content: Was paranoid about his medication change today; denies paranoia with staff or peers; denies ideas of reference/first rank symptoms, or AVH. Denies SI or HI - is ruminative about desire for Spiderman suit  History of Schizophrenia/Schizoaffective disorder:Yes   Duration of Psychotic Symptoms:Greater than six months   Hallucinations:Denied - is not grossly responding  to internal/external stimuli on exam   Ideas of Reference:None   Suicidal  Thoughts:Denied   Homicidal Thoughts:Denied    Sensorium  Memory: Fair  Judgment:Impaired   Insight:Shallow    Executive Functions  Concentration: Poor   Attention Span: Poor   Recall:Fair   Fund of Knowledge:Fair   Language:Good    Psychomotor Activity  Psychomotor Activity:No restlessness, tremor, or akathisias noted - steady gait    Assets  Assets:Desire for Improvement; Housing; Resilience; Social Support   Physical Exam Vitals and nursing note reviewed.  HENT:     Head: Normocephalic and atraumatic.  Neurological:     General: No focal deficit present.     Mental Status: He is alert.    Review of Systems  Constitutional:  Negative for chills and fever.  Cardiovascular:  Negative for chest pain and palpitations.  Gastrointestinal: Negative.   Genitourinary: Negative.   Musculoskeletal: Negative.   Neurological:  Negative for dizziness, tingling, tremors and headaches.  Psychiatric/Behavioral:  Negative for depression, hallucinations, memory loss, substance abuse and suicidal ideas. The patient is not nervous/anxious and does not have insomnia.    Blood pressure 125/81, pulse 91, temperature (!) 97.3 F (36.3 C), temperature source Oral, resp. rate 20, height 6\' 1"  (1.854 m), weight 117.9 kg, SpO2 99 %. Body mass index is 34.3 kg/m.   Treatment Plan Summary: ASSESSMENT: Principal Problem:   Schizoaffective disorder, bipolar type (HCC) Active Problems:   Tobacco use disorder  Type 2 DM Hyperlipidemia  PLAN: Safety and Monitoring:             -- Involuntary admission to inpatient psychiatric unit for safety, stabilization and treatment             -- Daily contact with patient to assess and evaluate symptoms and progress in treatment             -- Patient's case to be discussed in multi-disciplinary team meeting             -- Observation Level : q15 minute checks             -- Vital signs:  q12 hours             -- Precautions:  suicide, elopement, and assault   2. Psychiatric Diagnoses and Treatment:  # Schizoaffective disorder- bipolar type - Continue Zyprexa 10 mg every morning and 20 mg nightly.  -- Patient currently on Abilify Maintena 400 mg LAI, last known LAI 6/1. (Received Abilify maintena 8/16) -- Patient refused extra 5mg  of Abilify today and took only 20mg  - Will see if pharmacy has 10mg  tabs that can be broken in half that are not blue in color  - Continue Depakote DR 500 mg bid due to elevated ammonia level and repeat Ammonia level Monday for trending - presently asymptomatic for elevated ammonia -- Continue Restoril 15mg  qhs PRN insomnia and increase Trazodone to 100mg  qhs PRN insomnia  -- PRN: Agitation protocol with Zyprexa 10 mg, Geodon 20 mg, and Ativan 1 mg -- Metabolic profile and EKG monitoring obtained while on an atypical antipsychotic  BMI: 34.3 Lipid Panel: TG 172 HbgA1c: 6.9% QTc: pending when he will cooperate for testing -- Encouraged patient to participate in unit milieu and in scheduled group therapies      3. Medical Issues Being Addressed: #Nicotine use disorder - Nicotine patch 21 mg daily   #T2DM A.m. CBG 116. Home medication Synjardi 01/999 mg BID.  Due to the fact that patient is  actively psychotic, only completing a.m. FSBS.  Patient on home regimen and diabetes well-controlled (A1c 6.9%), so minimal concern for decompensation. -Metformin 1000 mg twice daily with meals - Jardiance 10 mg every morning - A.m. CBG monitoring only. SSI was implemented and discontinued.   #Hyperlipidemia - Continue home atorvastatin 40 mg daily  #Tobacco use d/o - Nicotine patch 21mg /24 hours  4. Discharge Planning:               -- Social work and case management to assist with discharge planning and identification of hospital follow-up needs prior to discharge             -- Discharge Concerns: Need to establish a safety plan; Medication compliance and effectiveness             --  Discharge Goals: Return home with outpatient referrals for mental health follow-up including medication management/psychotherapy   Harlow Asa, MD, FAPA 12/15/2021, 7:29 AM

## 2021-12-15 NOTE — Group Note (Signed)
  [facility] LCSW Group Therapy Note  Date/Time:  12/15/2021   Type of Therapy and Topic:  Group Therapy:  Feelings About Hospitalization  Participation Level:  Active   Description of Group This process group involved patients discussing their feelings related to being hospitalized, as well as the benefits they see to being in the hospital.  These feelings and benefits were itemized.  The group then brainstormed specific ways in which they could seek those same benefits when they discharge and return home.  Therapeutic Goals Patient will identify and describe positive and negative feelings related to hospitalization Patient will verbalize benefits of hospitalization to themselves personally Patients will brainstorm together ways they can obtain similar benefits in the outpatient setting, identify barriers to wellness and possible solutions  Summary of Patient Progress:  The patient expressed their primary feelings about being hospitalized are "I get more rest here than I did at [facility]."  Therapeutic Modalities Cognitive Behavioral Therapy Motivational Interviewing    [place], LCSWA 12/15/2021, 11:41 AM

## 2021-12-16 DIAGNOSIS — F250 Schizoaffective disorder, bipolar type: Secondary | ICD-10-CM | POA: Diagnosis not present

## 2021-12-16 LAB — GLUCOSE, CAPILLARY: Glucose-Capillary: 88 mg/dL (ref 70–99)

## 2021-12-16 MED ORDER — WHITE PETROLATUM EX OINT
TOPICAL_OINTMENT | CUTANEOUS | Status: AC
  Administered 2021-12-16: 1
  Filled 2021-12-16: qty 20

## 2021-12-16 MED ORDER — TEMAZEPAM 15 MG PO CAPS
30.0000 mg | ORAL_CAPSULE | Freq: Every evening | ORAL | Status: DC | PRN
  Administered 2021-12-16 – 2022-01-02 (×17): 30 mg via ORAL
  Filled 2021-12-16 (×17): qty 2

## 2021-12-16 MED ORDER — WHITE PETROLATUM EX OINT
TOPICAL_OINTMENT | CUTANEOUS | Status: AC
  Filled 2021-12-16: qty 5

## 2021-12-16 NOTE — [facility] Group Notes (Signed)
Adult Psychoeducational Group Note Date:  12/16/2021 Time:  0900-1045 Group Topic/Focus: PROGRESSIVE RELAXATION. A group where deep breathing is taught and tensing and relaxation muscle groups is used. Imagery is used as well.  Pts are asked to imagine 3 pillars that hold them up when they are not able to hold themselves up and to share that with the group.  Participation Level:  did not attend  Kouper Spinella A   

## 2021-12-16 NOTE — Progress Notes (Signed)
Adult Psychoeducational Group Note  Date:  12/16/2021 Time:  9:40 PM  Group Topic/Focus:  Wrap-Up Group:   The focus of this group is to help patients review their daily goal of treatment and discuss progress on daily workbooks.  Participation Level:  Active  Participation Quality:  Appropriate  Affect:  Appropriate  Cognitive:  Appropriate  Insight: Appropriate  Engagement in Group:  Engaged  Modes of Intervention:  Discussion  Additional Comments:  Pt stated had a good day.  Pt stated his goal for the day was to talk to doctor about discharge. Goal was not met.  Tonia Brooms D 12/16/2021, 9:40 PM

## 2021-12-16 NOTE — Group Note (Signed)
LCSW Group Therapy   Due to threatening behavior towards CSW on 12/14/21 during 500 hall group (including some patients punching the air around CSW and saying they could "charge them" if they wanted), group was not held on 12/16/2021. Patients were offered to meet with CSW as needed.  Harrie Jeans Alesandro Stueve LCSWA  12:24 PM

## 2021-12-16 NOTE — Progress Notes (Signed)
[facility] MD Progress Note  12/16/2021 7:56 AM [name]  MRN:  [id number]  Reason for Admission:  [name] is a 46 year old male with a reported past psychiatric history of schizophrenia paranoid, bipolar disorder housing insecurity who presented to [facility] ED on 8/3 under IVC for psychosis and bizarre behaviors.  The patient is currently on Hospital Day 9.   Chart Review from last 24 hours:  The patient's chart was reviewed and nursing notes were reviewed. The patient's case was discussed in multidisciplinary team meeting. Per nursing he refused any melatonin. He was suspicious on the unit and attended some groups. He only slept 4.75 hours. Per [facility] he was compliant with scheduled medications but per nursing report, he refused to take Abilify 25mg  and only took 20mg  yesterday. He did receive Vistaril X2 yesterday and Advil X1 and took Restoril and Trazodone PRN. Per nurses, he took Abilify 25mg  today using 10mg  tabs.   Yesterday's Recommendations per Psychiatry Team: - Continue Zyprexa 10 mg every morning and 20 mg nightly.  -- Patient currently on Abilify Maintena 400 mg LAI, last known LAI 6/1. (Received Abilify maintena 8/16) -- Patient refused extra 5mg  of Abilify today and took only 20mg  - Will see if pharmacy has 10mg  tabs that can be broken in half that are not blue in color  - Continue Depakote DR 500 mg bid due to elevated ammonia level and repeat Ammonia level Monday for trending - presently asymptomatic for elevated ammonia -- Continue Restoril 15mg  qhs PRN insomnia and increase Trazodone to 100mg  qhs PRN insomnia   Information Obtained Today During Patient Interview: The patient was seen and evaluated on the unit. He states he is "doing fine" today and voices no physical complaints. He reports good appetite and perceives that he is sleeping well despite report of only sleeping 4.75 hours. He states that if he feels sleepy during the day he likes to walk on the unit to avoid  taking naps. He denies medication side-effects. He denies current AVH. He derails into discussion about how he has previously hear and seen God in the past and how he was "summoning UFOs" prior to this admission. He denies current ideas of reference, first rank symptoms, or paranoia. He denies SI or HI. He is rambling and requires interruption on exam but is calm and pleasant.    Principal Problem: Schizoaffective disorder, bipolar type ([place]) Diagnosis: Principal Problem:   Schizoaffective disorder, bipolar type ([place]) Active Problems:   Tobacco use disorder   Diabetes ([place])   Hyperlipidemia   Total Time Spent in Direct Patient Care:  I personally spent 25 minutes on the unit in direct patient care. The direct patient care time included face-to-face time with the patient, reviewing the patient's chart, communicating with other professionals, and coordinating care. Greater than 50% of this time was spent in counseling or coordinating care with the patient regarding goals of hospitalization, psycho-education, and discharge planning needs.'  Past Psychiatric History: See H&P  Past Medical History:  Past Medical History:  Diagnosis Date   Bipolar disorder ([place])    Bronchitis    Hyperlipidemia    Paranoid schizophrenia ([place])    Schizophrenia ([place])     Past Surgical History:  Procedure Laterality Date   TESTICLE IMPLANTATION TO THIGH     TESTICLE SURGERY     Family History:  Family History  Problem Relation Age of Onset   Schizophrenia Neg Hx    Family Psychiatric  History: See H&P  Social History:  Social History   Substance and Sexual Activity  Alcohol Use Not Currently   Comment: weekly     Social History   Substance and Sexual Activity  Drug Use Yes   Types: Marijuana   Comment: smokes hemp    Social History   Socioeconomic History   Marital status: Single    Spouse name: Not on file   Number of children: Not on file   Years of education: 12 years   Highest  education level: 12th grade  Occupational History   Occupation: On disability  Tobacco Use   Smoking status: Every Day    Packs/day: 1.00    Types: Cigarettes   Smokeless tobacco: Never  Vaping Use   Vaping Use: Never used  Substance and Sexual Activity   Alcohol use: Not Currently    Comment: weekly   Drug use: Yes    Types: Marijuana    Comment: smokes hemp   Sexual activity: Yes    Birth control/protection: Condom  Other Topics Concern   Not on file  Social History Narrative   Pt lives alone in apartment complex for mentally ill   Social Determinants of Health   Financial Resource Strain: Not on file  Food Insecurity: Not on file  Transportation Needs: Not on file  Physical Activity: Not on file  Stress: Not on file  Social Connections: Not on file   Sleep: Fair - 4.75 hours  Appetite:  Good  Current Medications: Current Facility-Administered Medications  Medication Dose Route Frequency Provider Last Rate Last Admin   alum & mag hydroxide-simeth (MAALOX/MYLANTA) 200-200-20 MG/5ML suspension 30 mL  30 mL Oral Q4H PRN Bobbitt, Shalon E, NP   30 mL at 12/11/21 1819   ARIPiprazole (ABILIFY) tablet 25 mg  25 mg Oral Daily Massengill, Ovid Curd, MD   25 mg at 12/15/21 0813   ARIPiprazole ER (ABILIFY MAINTENA) injection 400 mg  400 mg Intramuscular Q28 days Massengill, Ovid Curd, MD   400 mg at 12/12/21 0935   atorvastatin (LIPITOR) tablet 40 mg  40 mg Oral QHS Bobbitt, Shalon E, NP   40 mg at 12/15/21 2042   benztropine (COGENTIN) tablet 1 mg  1 mg Oral BID PRN Harlow Asa, MD       Or   benztropine mesylate (COGENTIN) injection 1 mg  1 mg Intramuscular BID PRN Nelda Marseille, Grainger Mccarley E, MD       divalproex (DEPAKOTE) DR tablet 500 mg  500 mg Oral Q12H Massengill, Nathan, MD   500 mg at 12/15/21 2041   empagliflozin (JARDIANCE) tablet 10 mg  10 mg Oral Daily Rosezetta Schlatter, MD   10 mg at 12/15/21 0814   hydrOXYzine (ATARAX) tablet 25 mg  25 mg Oral TID PRN Bobbitt, Shalon E, NP    25 mg at 12/15/21 2042   ibuprofen (ADVIL) tablet 800 mg  800 mg Oral BID PRN Bobbitt, Shalon E, NP   800 mg at 12/15/21 1643   magnesium hydroxide (MILK OF MAGNESIA) suspension 30 mL  30 mL Oral Daily PRN Bobbitt, Shalon E, NP   30 mL at 12/10/21 2029   melatonin tablet 5 mg  5 mg Oral QHS PRN Massengill, Ovid Curd, MD   5 mg at 12/11/21 2049   metFORMIN (GLUCOPHAGE) tablet 1,000 mg  1,000 mg Oral BID WC Bobbitt, Shalon E, NP   1,000 mg at 12/15/21 1643   nicotine (NICODERM CQ - dosed in mg/24 hours) patch 21 mg  21 mg Transdermal Daily Rosezetta Schlatter, MD  21 mg at 12/15/21 0817   OLANZapine (ZYPREXA) tablet 10 mg  10 mg Oral Daily Lamar Sprinkles, MD   10 mg at 12/15/21 0814   OLANZapine (ZYPREXA) tablet 20 mg  20 mg Oral QHS Lamar Sprinkles, MD   20 mg at 12/15/21 2042   OLANZapine zydis (ZYPREXA) disintegrating tablet 5 mg  5 mg Oral Q8H PRN Massengill, Harrold Donath, MD       And   ziprasidone (GEODON) injection 20 mg  20 mg Intramuscular PRN Massengill, Harrold Donath, MD       sodium chloride (OCEAN) 0.65 % nasal spray 1 spray  1 spray Each Nare PRN Lamar Sprinkles, MD   1 spray at 12/12/21 2037   temazepam (RESTORIL) capsule 15 mg  15 mg Oral QHS PRN Phineas Inches, MD   15 mg at 12/15/21 2042   traZODone (DESYREL) tablet 100 mg  100 mg Oral QHS PRN Comer Locket, MD   100 mg at 12/15/21 2042    Lab Results:  Results for orders placed or performed during the hospital encounter of 12/07/21 (from the past 48 hour(s))  Glucose, capillary     Status: Abnormal   Collection Time: 12/15/21  6:13 AM  Result Value Ref Range   Glucose-Capillary 116 (H) 70 - 99 mg/dL    Comment: Glucose reference range applies only to samples taken after fasting for at least 8 hours.  Glucose, capillary     Status: None   Collection Time: 12/16/21  5:19 AM  Result Value Ref Range   Glucose-Capillary 88 70 - 99 mg/dL    Comment: Glucose reference range applies only to samples taken after fasting for at least 8 hours.     Blood Alcohol level:  Lab Results  Component Value Date   ETH <10 11/28/2021   ETH <10 11/23/2021    Metabolic Disorder Labs: Lab Results  Component Value Date   HGBA1C 6.9 (H) 11/17/2021   MPG 151.33 11/17/2021   MPG 111.15 02/03/2020   Lab Results  Component Value Date   PROLACTIN 8.1 06/01/2019   Lab Results  Component Value Date   CHOL 130 12/01/2021   TRIG 172 (H) 12/01/2021   HDL 28 (L) 12/01/2021   CHOLHDL 4.6 12/01/2021   VLDL 34 12/01/2021   LDLCALC 68 12/01/2021   LDLCALC 97 02/03/2020    Physical Findings:  Musculoskeletal: Strength & Muscle Tone: within normal limits Gait & Station: normal Patient leans: N/A  Psychiatric Specialty Exam:  Presentation  General Appearance: Casually dressed, fair hygiene - improved hygiene today, obese   Eye Contact:Fair   Speech:Clear and Coherent; Normal Rate (Verbose, not easily interruptible)   Speech Volume:Normal  Mood and Affect  Mood:Euthymic   Affect:polite, calm    Thought Process  Thought Processes:Tangential    Descriptions of Associations:Tangential   Orientation:Oriented to month, date, self, year and city   Thought Content: Appears less paranoid today and does not make paranoid statements; Denies AVH, ideas of reference or first rank symptoms and is not responding to internal stimuli during exam but is seen talking to self while pacing on the unit at intervals after interview  History of Schizophrenia/Schizoaffective disorder:Yes   Duration of Psychotic Symptoms:Greater than six months   Hallucinations:Denied -but is seen later talking to himself on the unit   Ideas of Reference:None   Suicidal Thoughts:Denied   Homicidal Thoughts:Denied    Sensorium  Memory: Fair  Judgment:Impaired   Insight:Shallow    Executive Functions  Concentration: Poor  Attention Span: Poor   Recall:Fair   Fund of Knowledge:Fair   Language:Good    Psychomotor  Activity  Psychomotor Activity:No restlessness, tremor, or akathisias noted - steady gait    Assets  Assets:Desire for Improvement; Housing; Resilience; Social Support   Physical Exam Vitals and nursing note reviewed.  HENT:     Head: Normocephalic and atraumatic.  Neurological:     General: No focal deficit present.     Mental Status: He is alert.    Review of Systems  Constitutional:  Negative for chills and fever.  Cardiovascular:  Negative for chest pain and palpitations.  Gastrointestinal: Negative.   Genitourinary: Negative.   Musculoskeletal: Negative.   Neurological:  Negative for dizziness, tingling, tremors and headaches.  Psychiatric/Behavioral:  Negative for depression, hallucinations, memory loss, substance abuse and suicidal ideas. The patient is not nervous/anxious and does not have insomnia.    Blood pressure (!) 134/90, pulse 92, temperature 97.6 F (36.4 C), temperature source Oral, resp. rate 20, height 6\' 1"  (1.854 m), weight 117.9 kg, SpO2 100 %. Body mass index is 34.3 kg/m.   Treatment Plan Summary: ASSESSMENT: Principal Problem:   Schizoaffective disorder, bipolar type ([facility]) Active Problems:   Tobacco use disorder  Type 2 DM Hyperlipidemia  PLAN: Safety and Monitoring:             -- Involuntary admission to inpatient psychiatric unit for safety, stabilization and treatment             -- Daily contact with patient to assess and evaluate symptoms and progress in treatment             -- Patient's case to be discussed in multi-disciplinary team meeting             -- Observation Level : q15 minute checks             -- Vital signs:  q12 hours             -- Precautions: suicide, elopement, and assault   2. Psychiatric Diagnoses and Treatment:  # Schizoaffective disorder-  bipolar type - Continue Zyprexa 10 mg every morning and 20 mg nightly.  -- Patient currently on Abilify Maintena 400 mg LAI, last known LAI 6/1. (Received Abilify maintena  8/16) -- Taking Abilify 25mg  daily  (using 10mg  tabs to avoid use of blue pills due to paranoia with med) - Continue Depakote DR 500 mg bid due to elevated ammonia level and repeat Ammonia level Monday for trending - presently asymptomatic for elevated ammonia -- Increase Restoril to 30mg  qhs PRN insomnia -- Continue Trazodone 100mg  qhs PRN insomnia and Melatonin 5mg  PRN insomnia  -- PRN: Agitation protocol with Zyprexa 10 mg, Geodon 20 mg, and Ativan 1 mg -- Metabolic profile and EKG monitoring obtained while on an atypical antipsychotic  BMI: 34.3 Lipid Panel: TG 172 HbgA1c: 6.9% QTc: pending when he will cooperate for testing -- Encouraged patient to participate in unit milieu and in scheduled group therapies      3. Medical Issues Being Addressed: #Nicotine use disorder - Nicotine patch 21 mg daily   #T2DM A.m. CBG 88. Home medication Synjardi 01/999 mg BID.  Due to the fact that patient is actively psychotic, only completing a.m. FSBS.  Patient on home regimen and diabetes well-controlled (A1c 6.9%), so minimal concern for decompensation. -Metformin 1000 mg twice daily with meals - Jardiance 10 mg every morning - A.m. CBG monitoring only. SSI was implemented and discontinued.   #Hyperlipidemia -  Continue home atorvastatin 40 mg daily  #Tobacco use d/o - Nicotine patch 21mg /24 hours  4. Discharge Planning:               -- Social work and case management to assist with discharge planning and identification of hospital follow-up needs prior to discharge             -- Discharge Concerns: Need to establish a safety plan; Medication compliance and effectiveness             -- Discharge Goals: Return home with outpatient referrals for mental health follow-up including medication management/psychotherapy   Harlow Asa, MD, FAPA 12/16/2021, 7:56 AM

## 2021-12-16 NOTE — Progress Notes (Signed)
   12/16/21 1300  Psych Admission Type (Psych Patients Only)  Admission Status Involuntary  Psychosocial Assessment  Patient Complaints Anxiety;Suspiciousness;Worrying  Eye Contact Fair  Facial Expression Animated  Affect Anxious;Appropriate to circumstance;Preoccupied  Speech Tangential  Interaction Assertive  Motor Activity Fidgety;Pacing  Appearance/Hygiene Disheveled  Behavior Characteristics Cooperative;Guarded  Mood Anxious;Suspicious;Preoccupied  Aggressive Behavior  Effect No apparent injury  Thought Process  Coherency Disorganized  Content Delusions  Delusions Paranoid  Perception Hallucinations  Hallucination Auditory;Visual  Judgment Limited  Confusion None  Danger to Self  Current suicidal ideation? Denies  Danger to Others  Danger to Others None reported or observed

## 2021-12-16 NOTE — Progress Notes (Signed)
   12/16/21 2000  Psych Admission Type (Psych Patients Only)  Admission Status Involuntary  Psychosocial Assessment  Patient Complaints Anxiety;Suspiciousness  Eye Contact Fair  Facial Expression Animated  Affect Anxious;Appropriate to circumstance  Child psychotherapist Assertive  Motor Activity Pacing  Appearance/Hygiene Disheveled  Behavior Characteristics Cooperative  Mood Suspicious;Anxious  Aggressive Behavior  Effect No apparent injury  Thought Process  Coherency Disorganized  Content Delusions  Delusions Paranoid  Perception Hallucinations  Hallucination Auditory;Visual  Judgment Limited  Confusion None  Danger to Self  Current suicidal ideation? Denies  Danger to Others  Danger to Others None reported or observed

## 2021-12-17 DIAGNOSIS — F17200 Nicotine dependence, unspecified, uncomplicated: Secondary | ICD-10-CM | POA: Diagnosis not present

## 2021-12-17 DIAGNOSIS — E119 Type 2 diabetes mellitus without complications: Secondary | ICD-10-CM | POA: Diagnosis not present

## 2021-12-17 DIAGNOSIS — F250 Schizoaffective disorder, bipolar type: Secondary | ICD-10-CM | POA: Diagnosis not present

## 2021-12-17 LAB — GLUCOSE, CAPILLARY: Glucose-Capillary: 99 mg/dL (ref 70–99)

## 2021-12-17 LAB — AMMONIA: Ammonia: 39 umol/L — ABNORMAL HIGH (ref 9–35)

## 2021-12-17 MED ORDER — MUSCLE RUB 10-15 % EX CREA
TOPICAL_CREAM | CUTANEOUS | Status: DC | PRN
Start: 2021-12-17 — End: 2022-01-09
  Administered 2021-12-25 – 2021-12-29 (×2): 1 via TOPICAL
  Filled 2021-12-17: qty 85

## 2021-12-17 MED ORDER — ARIPIPRAZOLE 15 MG PO TABS
15.0000 mg | ORAL_TABLET | Freq: Once | ORAL | Status: AC
  Administered 2021-12-19: 15 mg via ORAL
  Filled 2021-12-17: qty 1

## 2021-12-17 MED ORDER — ARIPIPRAZOLE 10 MG PO TABS
20.0000 mg | ORAL_TABLET | Freq: Every day | ORAL | Status: AC
  Administered 2021-12-18: 20 mg via ORAL
  Filled 2021-12-17: qty 2

## 2021-12-17 NOTE — Plan of Care (Signed)
  Problem: Clinical Measurements: Goal: Diagnostic test results will improve Outcome: Progressing Goal: Signs and symptoms of infection will decrease Outcome: Progressing   Problem: Coping: Goal: Coping ability will improve Outcome: Progressing Goal: Will verbalize feelings Outcome: Progressing   Problem: Safety: Goal: Ability to redirect hostility and anger into socially appropriate behaviors will improve Outcome: Progressing Goal: Ability to remain free from injury will improve Outcome: Progressing

## 2021-12-17 NOTE — Group Note (Signed)
[facility] LCSW Group Therapy   12/17/2021 1pm     Type of Therapy and Topic:  Group Therapy:  Strengths Exploration   Participation Level: Active  Description of Group: This group allows individuals to explore their strengths, learn to use strengths in new ways to improve well-being. Strengths-based interventions involve identifying strengths, understanding how they are used, and learning new ways to apply them. Individuals will identify their strengths, and then explore their roles in different areas of life (relationships, professional life, and personal fulfillment). Individuals will think about ways in which they currently use their strengths, along with new ways they could begin using them.    Therapeutic Goals Patient will verbalize two of their strengths Patient will identify how their strengths are currently used Patient will identify two new ways to apply their strengths  Patients will create a plan to apply their strengths in their daily lives     Summary of Patient Progress:  Patient actively participated in group. Patient was disorganized and had to be redirected multiple times.  Patient attempted to participate in group discussion and identified spiderman as a character that he admired for his strengths.         Therapeutic Modalities Cognitive Behavioral Therapy Motivational Interviewing   Ricardo Schubach, LCSW, LCAS Clincal Social Worker  [facility]

## 2021-12-17 NOTE — Progress Notes (Signed)
   12/17/21 0800  Psych Admission Type (Psych Patients Only)  Admission Status Voluntary  Psychosocial Assessment  Patient Complaints Anxiety;Irritability  Eye Contact Fair  Facial Expression Animated  Affect Anxious;Appropriate to circumstance  Child psychotherapist Assertive  Motor Activity Pacing  Appearance/Hygiene Disheveled  Behavior Characteristics Cooperative  Mood Suspicious;Anxious  Aggressive Behavior  Effect No apparent injury  Thought Process  Coherency Disorganized  Content Delusions  Delusions Paranoid  Perception Hallucinations  Hallucination Auditory  Judgment Limited  Confusion None  Danger to Self  Current suicidal ideation? Denies  Danger to Others  Danger to Others None reported or observed

## 2021-12-17 NOTE — Progress Notes (Signed)
[facility] MD Progress Note  12/17/2021 4:13 PM [name]  MRN:  [id number] Subjective:  [name] is a 46 year old male with a reported past psychiatric history of schizophrenia-paranoid, bipolar disorder housing insecurity who presented to [name] ED on 8/3 under IVC for psychosis and bizarre behaviors.    Yesterday's Recommendations per Psychiatry Team: - Continue Zyprexa 10 mg every morning and 20 mg nightly.  -- Patient currently on Abilify Maintena 400 mg LAI, last known LAI 6/1. Will continue PO bridge for atleast 7 days post-LAI.   - Increase Abilify 20 mg once daily. - Continue Depakote ER 750 mg twice daily             -VPA level 63 (prev 65), LFTs WNL, Plt 364 -Continue Cogentin 0.5 mg twice daily    On Evaluation Today:  On evaluation today, the patient continues to be disorganized and tangential, making statements such as, "I am going to change my name when I get home."  Although he denies AVH, he reports that he saw Jesus appearing in his room briefly, then disappear.  He states that his mood, sleep, and appetite are okay. He denies SI and HI.  Patient reports some right knee pain, but denies further somatic symptoms and denies adverse effects of medication.  Today, he does give permission to speak with his mom after her visit this weekend, which he reports went well.  Collateral: Mom, [name] [phone]): Spoke to mom. She says that patient blocked the door at mom's and also when he returned to his apartment. Threw medicine out of the window. Where he lives was the old [name].   Regarding the visit with him this weekend, she says that he was talking about magic and things that did not make sense, but he does it sometime especially when getting sick; it isn't unlike him. He had an LAI and his mind was clear, but he was gaining weight and discontinued. Believes it may have been Invega, but patient tries to keep things from mom. Psychiatrist met with patient  and mom at [name], cannot recall name. Mom saw that patient was getting sick, and went to [name] but they were unable to give MD contact information.   When [name] is doing well, talkative, helpful to mom, kind, and never makes bizarre statements. Since seeing him this weekend, he does seem closer to himself but still making some bizarre statements. He was calm, wanted to engage with mom. At baseline, he does not make bizarre comments, responsible, and mannerable. Court tomorrow regarding boarding up the door to where he was unreachable by police. He is unable to return to apartment due to trashing it. Believes he will need group home or some other housing, preferably individual.    Principal Problem: Schizoaffective disorder, bipolar type ([name]) Diagnosis: Principal Problem:   Schizoaffective disorder, bipolar type ([name]) Active Problems:   Tobacco use disorder   Diabetes ([place])   Hyperlipidemia   Total Time spent with patient: 20 minutes  Past Psychiatric History: Previous Psych Diagnoses: Schizophrenia, Schizoaffective disorder-bipolar type Prior inpatient treatment: Multiple, [name] 04/2018 Prior outpatient treatment: Day [name] History of suicide: History of homicide: Denies Psychiatric medication history: Previously on 6/14 Zyprexa 5 mg qAM and 30 mg qHS; Prolixin 10 mg in 05/2020, Invega Sustenna, PO Invega, and Haldol (02/2020), Neurontin.  Currently taking Abilify Maintena 400 mg and Zyprexa 10 mg BID Psychiatric medication compliance history: Reports compliance, and picking up medications appropriately, per pharmacy records Neuromodulation history: Denies  Current Psychiatrist:[facility] services via day [name]  Past Medical History:  Past Medical History:  Diagnosis Date   Bipolar disorder ([name])    Bronchitis    Hyperlipidemia    Paranoid schizophrenia ([name])    Schizophrenia ([name])     Past Surgical History:  Procedure Laterality Date   TESTICLE IMPLANTATION TO THIGH     TESTICLE  SURGERY     Family History:  Family History  Problem Relation Age of Onset   Schizophrenia Neg Hx    Family Psychiatric  History: See H&P Social History:  Social History   Substance and Sexual Activity  Alcohol Use Not Currently   Comment: weekly     Social History   Substance and Sexual Activity  Drug Use Yes   Types: Marijuana   Comment: smokes hemp    Social History   Socioeconomic History   Marital status: Single    Spouse name: Not on file   Number of children: Not on file   Years of education: 12 years   Highest education level: 12th grade  Occupational History   Occupation: On disability  Tobacco Use   Smoking status: Every Day    Packs/day: 1.00    Types: Cigarettes   Smokeless tobacco: Never  Vaping Use   Vaping Use: Never used  Substance and Sexual Activity   Alcohol use: Not Currently    Comment: weekly   Drug use: Yes    Types: Marijuana    Comment: smokes hemp   Sexual activity: Yes    Birth control/protection: Condom  Other Topics Concern   Not on file  Social History Narrative   Pt lives alone in apartment complex for mentally ill   Social Determinants of Health   Financial Resource Strain: Not on file  Food Insecurity: Not on file  Transportation Needs: Not on file  Physical Activity: Not on file  Stress: Not on file  Social Connections: Not on file   Additional Social History:       Sleep: Fair  Appetite:  Good  Current Medications: Current Facility-Administered Medications  Medication Dose Route Frequency Provider Last Rate Last Admin   alum & mag hydroxide-simeth (MAALOX/MYLANTA) 200-200-20 MG/5ML suspension 30 mL  30 mL Oral Q4H PRN [name], James E, NP   30 mL at 12/11/21 1819   [START ON 12/19/2021] ARIPiprazole (ABILIFY) tablet 15 mg  15 mg Oral Once James Schlatter, MD       [START ON 12/18/2021] ARIPiprazole (ABILIFY) tablet 20 mg  20 mg Oral Daily [name], James Sousa, MD       ARIPiprazole ER (ABILIFY MAINTENA) injection  400 mg  400 mg Intramuscular Q28 days [name], James Curd, MD   400 mg at 12/12/21 0935   atorvastatin (LIPITOR) tablet 40 mg  40 mg Oral QHS [name], James E, NP   40 mg at 12/16/21 2033   benztropine (COGENTIN) tablet 1 mg  1 mg Oral BID PRN Harlow Asa, MD       Or   benztropine mesylate (COGENTIN) injection 1 mg  1 mg Intramuscular BID PRN Nelda Marseille, Amy E, MD       divalproex (DEPAKOTE) DR tablet 500 mg  500 mg Oral Q12H [name], Nathan, MD   500 mg at 12/17/21 0815   empagliflozin (JARDIANCE) tablet 10 mg  10 mg Oral Daily James Schlatter, MD   10 mg at 12/17/21 0815   hydrOXYzine (ATARAX) tablet 25 mg  25 mg Oral TID PRN [name], Lennie Muckle, NP   25  mg at 12/15/21 2042   ibuprofen (ADVIL) tablet 800 mg  800 mg Oral BID PRN [name], James E, NP   800 mg at 12/17/21 0511   magnesium hydroxide (MILK OF MAGNESIA) suspension 30 mL  30 mL Oral Daily PRN [name], James E, NP   30 mL at 12/10/21 2029   melatonin tablet 5 mg  5 mg Oral QHS PRN [name], James Curd, MD   5 mg at 12/16/21 2033   metFORMIN (GLUCOPHAGE) tablet 1,000 mg  1,000 mg Oral BID WC [name], James E, NP   1,000 mg at 12/17/21 0815   Muscle Rub CREA   Topical PRN James Schlatter, MD       nicotine (NICODERM CQ - dosed in mg/24 hours) patch 21 mg  21 mg Transdermal Daily James Schlatter, MD   21 mg at 12/17/21 0816   OLANZapine (ZYPREXA) tablet 10 mg  10 mg Oral Daily James Schlatter, MD   10 mg at 12/17/21 0815   OLANZapine (ZYPREXA) tablet 20 mg  20 mg Oral QHS James Schlatter, MD   20 mg at 12/16/21 2032   OLANZapine zydis (ZYPREXA) disintegrating tablet 5 mg  5 mg Oral Q8H PRN [name], James Curd, MD       And   ziprasidone (GEODON) injection 20 mg  20 mg Intramuscular PRN [name], James Curd, MD       sodium chloride (OCEAN) 0.65 % nasal spray 1 spray  1 spray Each Nare PRN James Schlatter, MD   1 spray at 12/12/21 2037   temazepam (RESTORIL) capsule 30 mg  30 mg Oral QHS PRN Harlow Asa, MD   30 mg at 12/16/21  2033   traZODone (DESYREL) tablet 100 mg  100 mg Oral QHS PRN Harlow Asa, MD   100 mg at 12/16/21 2032    Lab Results:  Results for orders placed or performed during the hospital encounter of 12/07/21 (from the past 48 hour(s))  Glucose, capillary     Status: None   Collection Time: 12/16/21  5:19 AM  Result Value Ref Range   Glucose-Capillary 88 70 - 99 mg/dL    Comment: Glucose reference range applies only to samples taken after fasting for at least 8 hours.  Glucose, capillary     Status: None   Collection Time: 12/17/21  5:11 AM  Result Value Ref Range   Glucose-Capillary 99 70 - 99 mg/dL    Comment: Glucose reference range applies only to samples taken after fasting for at least 8 hours.  Ammonia     Status: Abnormal   Collection Time: 12/17/21  6:33 AM  Result Value Ref Range   Ammonia 39 (H) 9 - 35 umol/L    Comment: Performed at [name], [place] [address] [place], [place] [address]    Blood Alcohol level:  Lab Results  Component Value Date   [facility] <10 11/28/2021   ETH <10 60/45/4098    Metabolic Disorder Labs: Lab Results  Component Value Date   HGBA1C 6.9 (H) 11/17/2021   MPG 151.33 11/17/2021   MPG 111.15 02/03/2020   Lab Results  Component Value Date   PROLACTIN 8.1 06/01/2019   Lab Results  Component Value Date   CHOL 130 12/01/2021   TRIG 172 (H) 12/01/2021   HDL 28 (L) 12/01/2021   CHOLHDL 4.6 12/01/2021   VLDL 34 12/01/2021   LDLCALC 68 12/01/2021   LDLCALC 97 02/03/2020    Physical Findings:  Musculoskeletal: Strength & Muscle Tone: within normal limits  Gait & Station: normal Patient leans: N/A  Psychiatric Specialty Exam:  Presentation  General Appearance: Appropriate for Environment; Casual   Eye Contact:Good   Speech:Clear and Coherent; Normal Rate   Speech Volume:Normal   Handedness:Right    Mood and Affect  Mood:Euthymic   Affect:Blunt    Thought Process  Thought  Processes:Disorganized   Descriptions of Associations:Tangential   Orientation:Full (Time, Place and Person)   Thought Content:Scattered; Tangential; Illogical   History of Schizophrenia/Schizoaffective disorder:Yes   Duration of Psychotic Symptoms:Greater than six months   Hallucinations:Hallucinations: Visual Description of Visual Hallucinations: Saw Jesus come into his room    Ideas of Reference:None   Suicidal Thoughts:Suicidal Thoughts: No    Homicidal Thoughts:Homicidal Thoughts: No     Sensorium  Memory:Immediate Fair; Recent Fair   Judgment:Impaired   Insight:Poor; Shallow    Executive Functions  Concentration: Poor   Attention Span: Poor   Recall:Fair   Fund of Knowledge:Fair   Language:Fair    Psychomotor Activity  Psychomotor Activity:Psychomotor Activity: Normal     Assets  Assets:Desire for Improvement; Resilience; Social Support    Sleep  Sleep:Sleep: Better      Physical Exam: Physical Exam Vitals reviewed.  Constitutional:      General: He is not in acute distress.    Appearance: He is normal weight. He is not toxic-appearing.  HENT:     Head: Normocephalic and atraumatic.     Mouth/Throat:     Mouth: Mucous membranes are moist.     Pharynx: Oropharynx is clear.  Pulmonary:     Effort: Pulmonary effort is normal. No respiratory distress.  Skin:    General: Skin is warm and dry.  Neurological:     Mental Status: He is alert.     Motor: No weakness.     Gait: Gait normal.  Psychiatric:        Mood and Affect: Mood normal.        Behavior: Behavior normal.        Judgment: Judgment normal.    Review of Systems  Constitutional:  Negative for chills and fever.  Cardiovascular:  Negative for chest pain and palpitations.  Gastrointestinal: Negative.   Genitourinary: Negative.   Musculoskeletal: Negative.   Neurological:  Negative for dizziness, tingling, tremors and headaches.   Psychiatric/Behavioral:  Negative for depression, hallucinations, memory loss, substance abuse and suicidal ideas. The patient is not nervous/anxious and does not have insomnia.    Blood pressure (!) 119/91, pulse (!) 104, temperature 98 F (36.7 C), temperature source Oral, resp. rate 18, height _0  (1.854 m), weight 117.9 kg, SpO2 98 %. Body mass index is 34.3 kg/m.   Treatment Plan Summary: ASSESSMENT: Principal Problem:   Schizoaffective disorder, bipolar type ([place]) Active Problems:   Tobacco use disorder   Patient appears to have minimal improvements on current regimen, which leads [place] to believe that although he picked up his medications on time, he has not been compliant with taking them. ACT team confirmed that patient has not had LAI per the records since 6/1.  Concerns that since beginning Abilify, patient has not been improving; will begin to taper off Abilify and start workup for Clozaril, especially now that patient has failed multiple antipsychotics.   PLAN: Safety and Monitoring:             -- Involuntary admission to inpatient psychiatric unit for safety, stabilization and treatment             -- Daily contact with  patient to assess and evaluate symptoms and progress in treatment             -- Patient's case to be discussed in multi-disciplinary team meeting             -- Observation Level : q15 minute checks             -- Vital signs:  q12 hours             -- Precautions: suicide, elopement, and assault   2. Psychiatric Diagnoses and Treatment:  # Schizoaffective disorder-bipolar type - Continue Zyprexa 10 mg every morning and 20 mg nightly.  -- Patient currently on Abilify Maintena 400 mg LAI, last given 8/16 -Will begin to decrease p.o. Abilify to discontinuation; 20 mg tomorrow, followed by 15 mg on 8/23, then discontinue. -We will begin workup for start of Clozaril: CBC with differential, troponin, CRP, EKG to be obtained tomorrow a.m. weekly labs for the  first 4 to 8 weeks  -Clozaril to be started as patient has failed Invega, Haldol, Prolixin, and Abilify. -Continue Depakote DR 500 mg bid due to elevated ammonia level; patient asymptomatic, and with ongoing psychosis despite ammonia level decreasing from 45->43->39, ammonia is a less likely cause of confusion/psychosis.    PRN: Agitation protocol with Zyprexa 10 mg, Geodon 20 mg, and Ativan 1 mg   -- The risks/benefits/side-effects/alternatives to this medication were discussed in detail with the patient and time was given for questions. The patient consents to medication trial.              -- Metabolic profile and EKG monitoring obtained while on an atypical antipsychotic  BMI: 34.3 Lipid Panel: TG 172 HbgA1c: 6.9% QTc:              -- Encouraged patient to participate in unit milieu and in scheduled group therapies      3. Medical Issues Being Addressed: #Nicotine use disorder - Nicotine patch 21 mg daily   #T2DM A.m. CBG 99. Home medication Synjardi 01/999 mg BID.  Due to the fact that patient is actively psychotic, only completing a.m. sleep [name].g. monitoring.  Patient on home regimen and diabetes well-controlled (A1c 6.9%), so minimal concern for decompensation. -Metformin 1000 mg twice daily with meals - Jardiance 10 mg every morning - A.m. CBG monitoring only. SSI was implemented and discontinued.   #Hyperlipidemia - Continue home atorvastatin 40 mg daily  4. Discharge Planning:               -- Social work and case management to assist with discharge planning and identification of hospital follow-up needs prior to discharge             -- Estimated LOS: 5-7 days             -- Discharge Concerns: Need to establish a safety plan; Medication compliance and effectiveness             -- Discharge Goals: Return home with outpatient referrals for mental health follow-up including medication management/psychotherapy   James Schlatter, MD 12/17/2021, 4:13 PM

## 2021-12-17 NOTE — Group Note (Signed)
Recreation Therapy Group Note   Group Topic:Health and Wellness  Group Date: 12/17/2021 Start Time: 1005 End Time: 1035 Facilitators: Caroll Rancher, LRT,CTRS Location: [address] Dayroom   Goal Area(s) Addresses:  Patient will define components of whole wellness. Patient will verbalize benefit of whole wellness.  Group Description:  LRT and patients discussed the three elements (mental, physical and spiritual) of wellness and how they intersect.  Patients also discuss the consequences when one of the elements is not in tune with the others.  LRT and patients then went into a series of exercises.  LRT led group in a series of stretches before allowing each group member to pick an exercise of their choosing to lead the group in.  The exercises were to take a minimum of 30 minutes. Each patient took turns leading group with their exercise.  Patients were encouraged to take breaks and get water as needed.   Affect/Mood: Anxious   Participation Level: Non engaged   Participation Quality: None   Behavior: Distracted, Hallucinating, and Hyperverbal   Speech/Thought Process: Delusional   Insight: Lacking   Judgement: Lacking    Modes of Intervention: Music   Patient Response to Interventions:  Disengaged   Education Outcome:  Acknowledges education and In group clarification offered    Clinical Observations/Individualized Feedback: Pt attended group but did not participate.  Pt spent the time talking and laughing to himself.  Pt eventually left and did not return.    Plan: Continue to engage patient in RT group sessions 2-3x/week.   Caroll Rancher, LRT,CTRS 12/17/2021 11:53 AM

## 2021-12-17 NOTE — Progress Notes (Signed)
   12/17/21 2145  Psych Admission Type (Psych Patients Only)  Admission Status Involuntary  Psychosocial Assessment  Patient Complaints Anxiety  Eye Contact Fair  Facial Expression Animated  Affect Anxious;Appropriate to circumstance  Child psychotherapist Assertive  Motor Activity Pacing  Appearance/Hygiene Disheveled  Behavior Characteristics Cooperative  Mood Suspicious;Anxious;Pleasant  Aggressive Behavior  Effect No apparent injury  Thought Process  Coherency Disorganized  Content Delusions  Delusions Paranoid  Perception Hallucinations  Hallucination Auditory;Visual  Judgment Limited  Confusion None  Danger to Self  Current suicidal ideation? Denies  Danger to Others  Danger to Others None reported or observed

## 2021-12-17 NOTE — Progress Notes (Signed)
Adult Psychoeducational Group Note  Date:  12/17/2021 Time:  8:53 PM  Group Topic/Focus:  Wrap-Up Group:   The focus of this group is to help patients review their daily goal of treatment and discuss progress on daily workbooks.  Participation Level:  Active  Participation Quality:  Appropriate  Affect:  Appropriate  Cognitive:  Appropriate  Insight: Appropriate  Engagement in Group:  Improving  Modes of Intervention:  Discussion  Additional Comments:  Pt stated his goal for today was to focus on his treatment plan. Pt stated he accomplished his goal today. Pt stated he talked with his doctor and social worker about his care today. Pt rated his overall day a 10. Pt stated he was able to contact his parents and his father coming for visitation tonight improved his overall day. Pt stated he felt better about himself today. Pt stated he was able to attend all meals. Pt stated he took all medications provided today. Pt stated he attend all groups held today. Pt stated his appetite was pretty good today. Pt rated sleep last night was pretty good. Pt stated the goal tonight was to get some rest. Pt stated he had no physical pain today. Pt deny visual hallucinations and auditory issues tonight. Pt denies thoughts of harming himself or others. Pt stated he would alert staff if anything changed  Felipa Furnace 12/17/2021, 8:53 PM

## 2021-12-18 DIAGNOSIS — F17200 Nicotine dependence, unspecified, uncomplicated: Secondary | ICD-10-CM | POA: Diagnosis not present

## 2021-12-18 DIAGNOSIS — F250 Schizoaffective disorder, bipolar type: Secondary | ICD-10-CM | POA: Diagnosis not present

## 2021-12-18 DIAGNOSIS — E119 Type 2 diabetes mellitus without complications: Secondary | ICD-10-CM | POA: Diagnosis not present

## 2021-12-18 LAB — COMPREHENSIVE METABOLIC PANEL
ALT: 15 U/L (ref 0–44)
AST: 14 U/L — ABNORMAL LOW (ref 15–41)
Albumin: 3.8 g/dL (ref 3.5–5.0)
Alkaline Phosphatase: 57 U/L (ref 38–126)
Anion gap: 7 (ref 5–15)
BUN: 9 mg/dL (ref 6–20)
CO2: 26 mmol/L (ref 22–32)
Calcium: 9.1 mg/dL (ref 8.9–10.3)
Chloride: 110 mmol/L (ref 98–111)
Creatinine, Ser: 0.98 mg/dL (ref 0.61–1.24)
GFR, Estimated: >60 mL/min (ref >60)
Glucose, Bld: 135 mg/dL — ABNORMAL HIGH (ref 70–99)
Potassium: 4.1 mmol/L (ref 3.5–5.1)
Sodium: 143 mmol/L (ref 135–145)
Total Bilirubin: 0.2 mg/dL — ABNORMAL LOW (ref 0.3–1.2)
Total Protein: 7 g/dL (ref 6.5–8.1)

## 2021-12-18 LAB — SEDIMENTATION RATE: Sed Rate: 18 mm/hr — ABNORMAL HIGH (ref 0–16)

## 2021-12-18 LAB — CBC WITH DIFFERENTIAL/PLATELET
Abs Immature Granulocytes: 0.06 10*3/uL (ref 0.00–0.07)
Basophils Absolute: 0.1 10*3/uL (ref 0.0–0.1)
Basophils Relative: 1 %
Eosinophils Absolute: 0.2 10*3/uL (ref 0.0–0.5)
Eosinophils Relative: 2 %
HCT: 39.6 % (ref 39.0–52.0)
Hemoglobin: 13.3 g/dL (ref 13.0–17.0)
Immature Granulocytes: 1 %
Lymphocytes Relative: 30 %
Lymphs Abs: 2.7 10*3/uL (ref 0.7–4.0)
MCH: 29.6 pg (ref 26.0–34.0)
MCHC: 33.6 g/dL (ref 30.0–36.0)
MCV: 88 fL (ref 80.0–100.0)
Monocytes Absolute: 0.4 10*3/uL (ref 0.1–1.0)
Monocytes Relative: 4 %
Neutro Abs: 5.5 10*3/uL (ref 1.7–7.7)
Neutrophils Relative %: 62 %
Platelets: 334 10*3/uL (ref 150–400)
RBC: 4.5 MIL/uL (ref 4.22–5.81)
RDW: 12.1 % (ref 11.5–15.5)
WBC: 8.8 10*3/uL (ref 4.0–10.5)
nRBC: 0 % (ref 0.0–0.2)

## 2021-12-18 LAB — BRAIN NATRIURETIC PEPTIDE: B Natriuretic Peptide: 23.1 pg/mL (ref 0.0–100.0)

## 2021-12-18 LAB — GLUCOSE, CAPILLARY: Glucose-Capillary: 88 mg/dL (ref 70–99)

## 2021-12-18 LAB — TROPONIN I (HIGH SENSITIVITY): Troponin I (High Sensitivity): 2 ng/L (ref <18)

## 2021-12-18 LAB — C-REACTIVE PROTEIN: CRP: 0.7 mg/dL (ref <1.0)

## 2021-12-18 MED ORDER — BISMUTH SUBSALICYLATE 262 MG PO CHEW
CHEWABLE_TABLET | ORAL | Status: DC | PRN
Start: 2021-12-18 — End: 2021-12-18

## 2021-12-18 MED ORDER — BISMUTH SUBSALICYLATE 262 MG PO CHEW
524.0000 mg | CHEWABLE_TABLET | ORAL | Status: DC | PRN
  Administered 2021-12-18 – 2021-12-26 (×4): 524 mg via ORAL
  Filled 2021-12-18: qty 2

## 2021-12-18 NOTE — Progress Notes (Signed)
[facility] MD Progress Note  12/18/2021 7:54 AM [name]  MRN:  [id number] Subjective:  [name] is a 46 year old male with a reported past psychiatric history of schizophrenia-paranoid, bipolar disorder housing insecurity who presented to [facility] ED on 8/3 under IVC for psychosis and bizarre behaviors.    Yesterday's Recommendations per Psychiatry Team: - Continue Zyprexa 10 mg every morning and 20 mg nightly.  -- Patient currently on Abilify Maintena 400 mg LAI, last known LAI 6/1.   -Will begin to decrease p.o. Abilify to discontinuation; 20 mg tomorrow, followed by 15 mg on 8/23, then discontinue. -We will begin workup for start of Clozaril: CBC with differential, troponin, CRP, EKG to be obtained tomorrow a.m. weekly labs for the first 4 to 8 weeks  -Clozaril to be started as patient has failed Invega, Haldol, Prolixin, and Abilify. - Continue Depakote ER 750 mg twice daily             -VPA level 63 (prev 65), LFTs WNL, Plt 364 -Continue Cogentin 0.5 mg twice daily    On Evaluation Today:  Prior to evaluation, patient is observed walking down the hall and talking to himself; this writer was not close enough to determine if he was truly responding to internal stimuli or just talking aloud. On evaluation today, the patient continues to be disorganized and tangential, asking if the attending told me his secret. When asked to elaborate, he continues to state that he ate acorns, which gave him his permanent teeth that will never decay. He also insists that he has magic running through his blood. He denies SI, HI, and AVH today.  He states that his mood and appetite are okay, and that he slept "great and woke up at about 3 AM."   Patient denies somatic symptoms and denies adverse effects of medication. We discuss medication adjustments to Clozaril (r/b/se/a to medication reviewed), and he consents to med trial.     Principal Problem: Schizoaffective disorder, bipolar type ([place]) Diagnosis:  Principal Problem:   Schizoaffective disorder, bipolar type ([place]) Active Problems:   Tobacco use disorder   Diabetes ([place])   Hyperlipidemia   Total Time spent with patient: 20 minutes  Past Psychiatric History: Previous Psych Diagnoses: Schizophrenia, Schizoaffective disorder-bipolar type Prior inpatient treatment: Multiple, [facility] 04/2018 Prior outpatient treatment: [name] History of suicide: History of homicide: Denies Psychiatric medication history: Previously on 6/14 Zyprexa 5 mg qAM and 30 mg qHS; Prolixin 10 mg in 05/2020, Invega Sustenna, PO Invega, and Haldol (02/2020), Neurontin.  Currently taking Abilify Maintena 400 mg and Zyprexa 10 mg BID Psychiatric medication compliance history: Reports compliance, and picking up medications appropriately, per pharmacy records Neuromodulation history: Denies Current Psychiatrist:[facility] services via [name]  Past Medical History:  Past Medical History:  Diagnosis Date   Bipolar disorder ([place])    Bronchitis    Hyperlipidemia    Paranoid schizophrenia ([place])    Schizophrenia ([place])     Past Surgical History:  Procedure Laterality Date   TESTICLE IMPLANTATION TO THIGH     TESTICLE SURGERY     Family History:  Family History  Problem Relation Age of Onset   Schizophrenia Neg Hx    Family Psychiatric  History: See H&P Social History:  Social History   Substance and Sexual Activity  Alcohol Use Not Currently   Comment: weekly     Social History   Substance and Sexual Activity  Drug Use Yes   Types: Marijuana   Comment: smokes hemp  Social History   Socioeconomic History   Marital status: Single    Spouse name: Not on file   Number of children: Not on file   Years of education: 12 years   Highest education level: 12th grade  Occupational History   Occupation: On disability  Tobacco Use   Smoking status: Every Day    Packs/day: 1.00    Types: Cigarettes   Smokeless tobacco: Never  Vaping Use   Vaping Use:  Never used  Substance and Sexual Activity   Alcohol use: Not Currently    Comment: weekly   Drug use: Yes    Types: Marijuana    Comment: smokes hemp   Sexual activity: Yes    Birth control/protection: Condom  Other Topics Concern   Not on file  Social History Narrative   Pt lives alone in apartment complex for mentally ill   Social Determinants of Health   Financial Resource Strain: Not on file  Food Insecurity: Not on file  Transportation Needs: Not on file  Physical Activity: Not on file  Stress: Not on file  Social Connections: Not on file   Additional Social History:       Sleep: Fair  Appetite:  Good  Current Medications: Current Facility-Administered Medications  Medication Dose Route Frequency Provider Last Rate Last Admin   alum & mag hydroxide-simeth (MAALOX/MYLANTA) 200-200-20 MG/5ML suspension 30 mL  30 mL Oral Q4H PRN Bobbitt, Shalon E, NP   30 mL at 12/11/21 1819   [START ON 12/19/2021] ARIPiprazole (ABILIFY) tablet 15 mg  15 mg Oral Once Rosezetta Schlatter, MD       ARIPiprazole (ABILIFY) tablet 20 mg  20 mg Oral Daily , Loma Sousa, MD       ARIPiprazole ER (ABILIFY MAINTENA) injection 400 mg  400 mg Intramuscular Q28 days Massengill, Ovid Curd, MD   400 mg at 12/12/21 0935   atorvastatin (LIPITOR) tablet 40 mg  40 mg Oral QHS Bobbitt, Shalon E, NP   40 mg at 12/17/21 2049   benztropine (COGENTIN) tablet 1 mg  1 mg Oral BID PRN Harlow Asa, MD       Or   benztropine mesylate (COGENTIN) injection 1 mg  1 mg Intramuscular BID PRN Nelda Marseille, Amy E, MD       divalproex (DEPAKOTE) DR tablet 500 mg  500 mg Oral Q12H Massengill, Nathan, MD   500 mg at 12/17/21 2050   empagliflozin (JARDIANCE) tablet 10 mg  10 mg Oral Daily Rosezetta Schlatter, MD   10 mg at 12/17/21 0815   hydrOXYzine (ATARAX) tablet 25 mg  25 mg Oral TID PRN Bobbitt, Shalon E, NP   25 mg at 12/15/21 2042   ibuprofen (ADVIL) tablet 800 mg  800 mg Oral BID PRN Bobbitt, Shalon E, NP   800 mg at  12/17/21 1804   magnesium hydroxide (MILK OF MAGNESIA) suspension 30 mL  30 mL Oral Daily PRN Bobbitt, Shalon E, NP   30 mL at 12/10/21 2029   melatonin tablet 5 mg  5 mg Oral QHS PRN Massengill, Ovid Curd, MD   5 mg at 12/17/21 2050   metFORMIN (GLUCOPHAGE) tablet 1,000 mg  1,000 mg Oral BID WC Bobbitt, Shalon E, NP   1,000 mg at 12/17/21 1647   Muscle Rub CREA   Topical PRN Rosezetta Schlatter, MD       nicotine (NICODERM CQ - dosed in mg/24 hours) patch 21 mg  21 mg Transdermal Daily Rosezetta Schlatter, MD   21 mg at 12/17/21  0816   OLANZapine (ZYPREXA) tablet 10 mg  10 mg Oral Daily Rosezetta Schlatter, MD   10 mg at 12/17/21 0815   OLANZapine (ZYPREXA) tablet 20 mg  20 mg Oral QHS Rosezetta Schlatter, MD   20 mg at 12/17/21 2049   OLANZapine zydis (ZYPREXA) disintegrating tablet 5 mg  5 mg Oral Q8H PRN Massengill, Ovid Curd, MD       And   ziprasidone (GEODON) injection 20 mg  20 mg Intramuscular PRN Massengill, Ovid Curd, MD       sodium chloride (OCEAN) 0.65 % nasal spray 1 spray  1 spray Each Nare PRN Rosezetta Schlatter, MD   1 spray at 12/12/21 2037   temazepam (RESTORIL) capsule 30 mg  30 mg Oral QHS PRN Harlow Asa, MD   30 mg at 12/17/21 2050   traZODone (DESYREL) tablet 100 mg  100 mg Oral QHS PRN Harlow Asa, MD   100 mg at 12/17/21 2050    Lab Results:  Results for orders placed or performed during the hospital encounter of 12/07/21 (from the past 48 hour(s))  Glucose, capillary     Status: None   Collection Time: 12/17/21  5:11 AM  Result Value Ref Range   Glucose-Capillary 99 70 - 99 mg/dL    Comment: Glucose reference range applies only to samples taken after fasting for at least 8 hours.  Ammonia     Status: Abnormal   Collection Time: 12/17/21  6:33 AM  Result Value Ref Range   Ammonia 39 (H) 9 - 35 umol/L    Comment: Performed at [facility], [place] [address], [place], [place] [address]  Glucose, capillary     Status: None   Collection Time: 12/18/21  5:16 AM   Result Value Ref Range   Glucose-Capillary 88 70 - 99 mg/dL    Comment: Glucose reference range applies only to samples taken after fasting for at least 8 hours.  CBC with Differential/Platelet     Status: None   Collection Time: 12/18/21  6:28 AM  Result Value Ref Range   WBC 8.8 4.0 - 10.5 K/uL   RBC 4.50 4.22 - 5.81 MIL/uL   Hemoglobin 13.3 13.0 - 17.0 g/dL   HCT 39.6 39.0 - 52.0 %   MCV 88.0 80.0 - 100.0 fL   MCH 29.6 26.0 - 34.0 pg   MCHC 33.6 30.0 - 36.0 g/dL   RDW 12.1 11.5 - 15.5 %   Platelets 334 150 - 400 K/uL   nRBC 0.0 0.0 - 0.2 %   Neutrophils Relative % 62 %   Neutro Abs 5.5 1.7 - 7.7 K/uL   Lymphocytes Relative 30 %   Lymphs Abs 2.7 0.7 - 4.0 K/uL   Monocytes Relative 4 %   Monocytes Absolute 0.4 0.1 - 1.0 K/uL   Eosinophils Relative 2 %   Eosinophils Absolute 0.2 0.0 - 0.5 K/uL   Basophils Relative 1 %   Basophils Absolute 0.1 0.0 - 0.1 K/uL   Immature Granulocytes 1 %   Abs Immature Granulocytes 0.06 0.00 - 0.07 K/uL    Comment: Performed at [facility], [place] [address] [place], [place] [address]    Blood Alcohol level:  Lab Results  Component Value Date   [facility] <10 11/28/2021   ETH <10 61/60/7371    Metabolic Disorder Labs: Lab Results  Component Value Date   HGBA1C 6.9 (H) 11/17/2021   MPG 151.33 11/17/2021   MPG 111.15 02/03/2020   Lab Results  Component Value Date   PROLACTIN 8.1 06/01/2019   Lab Results  Component Value Date   CHOL 130 12/01/2021   TRIG 172 (H) 12/01/2021   HDL 28 (L) 12/01/2021   CHOLHDL 4.6 12/01/2021   VLDL 34 12/01/2021   LDLCALC 68 12/01/2021   LDLCALC 97 02/03/2020    Physical Findings:  Musculoskeletal: Strength & Muscle Tone: within normal limits Gait & Station: normal Patient leans: N/A  Psychiatric Specialty Exam:  Presentation  General Appearance: Appropriate for Environment; Casual   Eye Contact:Good   Speech:Clear and Coherent; Normal Rate   Speech  Volume:Normal   Handedness:Right    Mood and Affect  Mood:Euthymic   Affect:Blunt    Thought Process  Thought Processes:Disorganized   Descriptions of Associations:Tangential   Orientation:Full (Time, Place and Person)   Thought Content:Scattered; Tangential; Illogical   History of Schizophrenia/Schizoaffective disorder:Yes   Duration of Psychotic Symptoms:Greater than six months   Hallucinations:Hallucinations: Visual Description of Visual Hallucinations: Saw Jesus come into his room    Ideas of Reference:None   Suicidal Thoughts:Suicidal Thoughts: No    Homicidal Thoughts:Homicidal Thoughts: No     Sensorium  Memory:Immediate Fair; Recent Fair   Judgment:Impaired   Insight:Poor; Shallow    Executive Functions  Concentration: Poor   Attention Span: Poor   Recall:Fair   Fund of Knowledge:Fair   Language:Fair    Psychomotor Activity  Psychomotor Activity:Psychomotor Activity: Normal     Assets  Assets:Desire for Improvement; Resilience; Social Support    Sleep  Sleep:Sleep: Better      Physical Exam: Physical Exam Vitals reviewed.  Constitutional:      General: He is not in acute distress.    Appearance: He is normal weight. He is not toxic-appearing.  HENT:     Head: Normocephalic and atraumatic.     Mouth/Throat:     Mouth: Mucous membranes are moist.     Pharynx: Oropharynx is clear.  Pulmonary:     Effort: Pulmonary effort is normal. No respiratory distress.  Skin:    General: Skin is warm and dry.  Neurological:     Mental Status: He is alert.     Motor: No weakness.     Gait: Gait normal.  Psychiatric:        Mood and Affect: Mood normal.        Behavior: Behavior normal.        Judgment: Judgment normal.    Review of Systems  Constitutional:  Negative for chills and fever.  Cardiovascular:  Negative for chest pain and palpitations.  Gastrointestinal: Negative.   Genitourinary: Negative.    Musculoskeletal: Negative.   Neurological:  Negative for dizziness, tingling, tremors and headaches.  Psychiatric/Behavioral:  Negative for depression, hallucinations, memory loss, substance abuse and suicidal ideas. The patient is not nervous/anxious and does not have insomnia.    Blood pressure 113/71, pulse 98, temperature (!) 97.5 F (36.4 C), temperature source Oral, resp. rate 18, height 6' 1" (1.854 m), weight 117.9 kg, SpO2 97 %. Body mass index is 34.3 kg/m.   Treatment Plan Summary: ASSESSMENT: Principal Problem:   Schizoaffective disorder, bipolar type ([place]) Active Problems:   Tobacco use disorder   Patient appears to have minimal improvements on current regimen, which leads [place] to believe that although he picked up his medications on time, he has not been compliant with taking them. ACT team confirmed that patient has not had LAI per the records since 6/1.  Concerns that since beginning Abilify, patient has not been  improving; will begin to taper off Abilify and start workup for Clozaril, especially now that patient has failed multiple antipsychotics.   PLAN: Safety and Monitoring:             -- Involuntary admission to inpatient psychiatric unit for safety, stabilization and treatment             -- Daily contact with patient to assess and evaluate symptoms and progress in treatment             -- Patient's case to be discussed in multi-disciplinary team meeting             -- Observation Level : q15 minute checks             -- Vital signs:  q12 hours             -- Precautions: suicide, elopement, and assault   2. Psychiatric Diagnoses and Treatment:  # Schizoaffective disorder-bipolar type - Continue Zyprexa 10 mg every morning and 20 mg nightly.  -- Patient currently on Abilify Maintena 400 mg LAI, last given 8/16 -Will decrease p.o. Abilify to discontinuation; 20 mg today, followed by 15 mg on 8/23, then discontinue. -We will begin workup for start of Clozaril:  weekly labs for the first 4 to 8 weeks  -Clozaril to be started as patient has failed Invega, Haldol, Prolixin, and Abilify.  -- Labs: CBC (WNL, ANC 5.5), ESR 18, BNP 23.1, Trop 2, CMP/ LFTs (WNL), CRP pending  -- Patient will likely have to also have addition of an FGA, on triple antipsychotic therapy. Will monitor upon initiation of Clozaril. -Continue Depakote DR 500 mg bid due to elevated ammonia level; patient asymptomatic, and with ongoing psychosis despite ammonia level decreasing from 45->43->39, ammonia is a less likely cause of confusion/psychosis.    PRN: Agitation protocol with Zyprexa 10 mg, Geodon 20 mg, and Ativan 1 mg   -- The risks/benefits/side-effects/alternatives to this medication were discussed in detail with the patient and time was given for questions. The patient consents to medication trial.              -- Metabolic profile and EKG monitoring obtained while on an atypical antipsychotic  BMI: 34.3 Lipid Panel: TG 172 HbgA1c: 6.9% QTc: 413             -- Encouraged patient to participate in unit milieu and in scheduled group therapies      3. Medical Issues Being Addressed: #Nicotine use disorder - Nicotine patch 21 mg daily   #T2DM A.m. CBG 88. Home medication Synjardi 01/999 mg BID.  Due to the fact that patient is actively psychotic, only completing a.m. sleep e.g. monitoring.  Patient on home regimen and diabetes well-controlled (A1c 6.9%), so minimal concern for decompensation. -Metformin 1000 mg twice daily with meals - Jardiance 10 mg every morning - A.m. CBG monitoring only. SSI was implemented and discontinued.   #Hyperlipidemia - Continue home atorvastatin 40 mg daily  4. Discharge Planning:               -- Social work and case management to assist with discharge planning and identification of hospital follow-up needs prior to discharge             -- Estimated LOS: 5-7 days             -- Discharge Concerns: Need to establish a safety plan;  Medication compliance and effectiveness             --  Discharge Goals: Return home with outpatient referrals for mental health follow-up including medication management/psychotherapy   Rosezetta Schlatter, MD 12/18/2021, 7:54 AM

## 2021-12-18 NOTE — Plan of Care (Signed)
  Problem: Clinical Measurements: Goal: Diagnostic test results will improve Outcome: Progressing Goal: Signs and symptoms of infection will decrease Outcome: Progressing   Problem: Coping: Goal: Coping ability will improve Outcome: Progressing Goal: Will verbalize feelings Outcome: Progressing

## 2021-12-18 NOTE — Progress Notes (Signed)
   12/18/21 2115  Psych Admission Type (Psych Patients Only)  Admission Status Involuntary  Psychosocial Assessment  Patient Complaints Anxiety  Eye Contact Fair  Facial Expression Animated  Affect Anxious;Appropriate to circumstance  Child psychotherapist Assertive  Motor Activity Pacing  Appearance/Hygiene Disheveled  Behavior Characteristics Cooperative  Mood Suspicious;Pleasant  Aggressive Behavior  Effect No apparent injury  Thought Process  Coherency Disorganized  Content Delusions  Delusions Paranoid  Perception Hallucinations  Hallucination Auditory;Visual  Judgment Limited  Confusion None  Danger to Self  Current suicidal ideation? Denies  Danger to Others  Danger to Others None reported or observed

## 2021-12-18 NOTE — Progress Notes (Signed)
   12/18/21 0800  Psych Admission Type (Psych Patients Only)  Admission Status Voluntary  Psychosocial Assessment  Patient Complaints Anxiety  Eye Contact Fair  Facial Expression Animated  Affect Anxious;Appropriate to circumstance  Child psychotherapist Assertive  Motor Activity Pacing  Appearance/Hygiene Disheveled  Behavior Characteristics Cooperative  Mood Suspicious;Euphoric  Aggressive Behavior  Targets Other (Comment) (No aggressive behavior noted)  Effect No apparent injury  Thought Process  Coherency Disorganized  Content Delusions  Delusions Paranoid  Perception Depersonalization;Illusions  Hallucination Auditory;Visual  Judgment Poor  Danger to Self  Current suicidal ideation? Denies  Danger to Others  Danger to Others None reported or observed

## 2021-12-18 NOTE — Progress Notes (Signed)
   12/18/21 0515  Sleep  Number of Hours 5.75

## 2021-12-18 NOTE — Progress Notes (Signed)
Adult Psychoeducational Group Note  Date:  12/18/2021 Time:  8:35 PM  Group Topic/Focus:  Wrap-Up Group:   The focus of this group is to help patients review their daily goal of treatment and discuss progress on daily workbooks.  Participation Level:  Active  Participation Quality:  Appropriate  Affect:  Appropriate  Cognitive:  Appropriate  Insight: Appropriate  Engagement in Group:  Improving  Modes of Intervention:  Discussion  Additional Comments:  Pt stated his goal for today was to focus on his treatment plan. Pt stated he accomplished his goal today. Pt stated he talked with his doctor and social worker about his care today. Pt rated his overall day a 10. Pt stated he was able to contact his parents today which improved his overall day. Pt stated he felt better about himself today. Pt stated he was able to attend all meals. Pt stated he took all medications provided today. Pt stated he attend all groups held today. Pt stated his appetite was pretty good today. Pt rated sleep last night was pretty good. Pt stated the goal tonight was to get some rest. Pt stated he had no physical pain today. Pt deny visual hallucinations and auditory issues tonight. Pt denies thoughts of harming himself or others. Pt stated he would alert staff if anything changed  Felipa Furnace 12/18/2021, 8:35 PM

## 2021-12-18 NOTE — Progress Notes (Signed)
EKG done last night

## 2021-12-19 ENCOUNTER — Encounter (HOSPITAL_COMMUNITY): Payer: Self-pay

## 2021-12-19 DIAGNOSIS — F17200 Nicotine dependence, unspecified, uncomplicated: Secondary | ICD-10-CM | POA: Diagnosis not present

## 2021-12-19 DIAGNOSIS — E119 Type 2 diabetes mellitus without complications: Secondary | ICD-10-CM | POA: Diagnosis not present

## 2021-12-19 DIAGNOSIS — F250 Schizoaffective disorder, bipolar type: Secondary | ICD-10-CM | POA: Diagnosis not present

## 2021-12-19 LAB — GLUCOSE, CAPILLARY: Glucose-Capillary: 98 mg/dL (ref 70–99)

## 2021-12-19 MED ORDER — CLOZAPINE 25 MG PO TABS
12.5000 mg | ORAL_TABLET | Freq: Every day | ORAL | Status: AC
  Administered 2021-12-19: 12.5 mg via ORAL
  Filled 2021-12-19: qty 1

## 2021-12-19 MED ORDER — DIVALPROEX SODIUM ER 500 MG PO TB24
1000.0000 mg | ORAL_TABLET | Freq: Every day | ORAL | Status: DC
  Administered 2021-12-20: 1000 mg via ORAL
  Filled 2021-12-19 (×4): qty 2

## 2021-12-19 MED ORDER — DIVALPROEX SODIUM 500 MG PO DR TAB
500.0000 mg | DELAYED_RELEASE_TABLET | Freq: Two times a day (BID) | ORAL | Status: AC
  Administered 2021-12-19: 500 mg via ORAL
  Filled 2021-12-19: qty 1

## 2021-12-19 MED ORDER — CLOZAPINE 25 MG PO TABS
37.5000 mg | ORAL_TABLET | Freq: Every day | ORAL | Status: AC
  Administered 2021-12-21: 37.5 mg via ORAL
  Filled 2021-12-19: qty 2

## 2021-12-19 MED ORDER — CLOZAPINE 25 MG PO TABS
62.5000 mg | ORAL_TABLET | Freq: Every day | ORAL | Status: AC
  Administered 2021-12-23: 62.5 mg via ORAL
  Filled 2021-12-19: qty 3

## 2021-12-19 MED ORDER — CLOZAPINE 25 MG PO TABS
87.5000 mg | ORAL_TABLET | Freq: Every day | ORAL | Status: AC
  Administered 2021-12-25: 87.5 mg via ORAL
  Filled 2021-12-19: qty 4

## 2021-12-19 MED ORDER — WHITE PETROLATUM EX OINT
TOPICAL_OINTMENT | CUTANEOUS | Status: AC
  Administered 2021-12-19: 1
  Filled 2021-12-19: qty 5

## 2021-12-19 MED ORDER — CLOZAPINE 25 MG PO TABS
75.0000 mg | ORAL_TABLET | Freq: Every day | ORAL | Status: AC
  Administered 2021-12-24: 75 mg via ORAL
  Filled 2021-12-19: qty 3

## 2021-12-19 MED ORDER — CLOZAPINE 25 MG PO TABS
50.0000 mg | ORAL_TABLET | Freq: Every day | ORAL | Status: AC
  Administered 2021-12-22: 50 mg via ORAL
  Filled 2021-12-19: qty 2

## 2021-12-19 MED ORDER — CLOZAPINE 25 MG PO TABS
25.0000 mg | ORAL_TABLET | Freq: Every day | ORAL | Status: AC
  Administered 2021-12-20: 25 mg via ORAL
  Filled 2021-12-19: qty 1

## 2021-12-19 MED ORDER — CLOZAPINE 100 MG PO TABS
100.0000 mg | ORAL_TABLET | Freq: Every day | ORAL | Status: AC
  Administered 2021-12-26: 100 mg via ORAL
  Filled 2021-12-19: qty 1

## 2021-12-19 NOTE — Progress Notes (Signed)
Pt observed with increased paranoia, delusions,  agitated, pacing, yelling and screaming at staff this evening "Y'all need to stop stealing my blood and putting it in the damn  computer. Call your supervisor now, I my shit back. I'm calling Tamela Gammon and he will tell me who the fuck is messing with my blood". Pt not verbally redirectable at the time. PRN Zydis 5 mg and Vistaril 25 mg PO given at 1650 with very minimal effect when reassessed at 1750.

## 2021-12-19 NOTE — Progress Notes (Addendum)
Tiffany from [employer])  called to talk to [employer] [phone]

## 2021-12-19 NOTE — Group Note (Signed)
LCSW Group Therapy Note  Group Date: 12/19/2021 Start Time: 1300 End Time: 1400   Type of Therapy and Topic:  Group Therapy: Anger Cues and Responses  Participation Level:  Minimal   Description of Group:   In this group, patients learned how to recognize the physical, cognitive, emotional, and behavioral responses they have to anger-provoking situations.  They identified a recent time they became angry and how they reacted.  They analyzed how their reaction was possibly beneficial and how it was possibly unhelpful.  The group discussed a variety of healthier coping skills that could help with such a situation in the future.  Focus was placed on how helpful it is to recognize the underlying emotions to our anger, because working on those can lead to a more permanent solution as well as our ability to focus on the important rather than the urgent.  Therapeutic Goals: Patients will remember their last incident of anger and how they felt emotionally and physically, what their thoughts were at the time, and how they behaved. Patients will identify how their behavior at that time worked for them, as well as how it worked against them. Patients will explore possible new behaviors to use in future anger situations. Patients will learn that anger itself is normal and cannot be eliminated, and that healthier reactions can assist with resolving conflict rather than worsening situations.  Summary of Patient Progress:  Juvenal was minimal during the group. Patient listened to other group members but did not participate in group discussion.  Patient discussed medicine being a way that he calms down.   Therapeutic Modalities:   Cognitive Behavioral Therapy    Beatris Si, LCSW 12/19/2021  2:11 PM

## 2021-12-19 NOTE — Progress Notes (Signed)
[facility] MD Progress Note  12/19/2021 7:30 AM MARSHAWN NORMOYLE  MRN:  [id number] Subjective:  [name] is a 46 year old male with a reported past psychiatric history of schizophrenia-paranoid, bipolar disorder housing insecurity who presented to [facility] ED on 8/3 under IVC for psychosis and bizarre behaviors.    Yesterday's Recommendations per Psychiatry Team: - Continue Zyprexa 10 mg every morning and 20 mg nightly.  -- Patient currently on Abilify Maintena 400 mg LAI, last known LAI 6/1.  -Will decrease p.o. Abilify to discontinuation; 20 mg today, followed by 15 mg on 8/23, then discontinue. -We will begin workup for start of Clozaril: weekly labs for the first 4 to 8 weeks            -Clozaril to be started as patient has failed Invega, Haldol, Prolixin, and Abilify.            -- Labs: CBC (WNL, ANC 5.5), ESR 18, BNP 23.1, Trop 2, CMP/ LFTs (WNL), CRP pending            -- Patient will likely have to also have addition of an FGA, on triple antipsychotic therapy. Will monitor upon initiation of Clozaril. - Continue Depakote ER 750 mg twice daily             -VPA level 63 (prev 65), LFTs WNL, Plt 364 -Continue Cogentin 0.5 mg twice daily    On Evaluation Today:  Patient is observed walking down the hall and in room talking and laughing to himself.  When questioned, patient reports that he thought of a funny video in which [employer] was rapping.  On evaluation today, the patient continues to be disorganized and tangential, although initially concrete in some responses.  Patient reports leaving group due to being unable to focus because of his racing thoughts.  He endorses auditory hallucinations today, stating that his brother was talking to him this morning.  He says, "57 [address]," he has been communicating with them both (his brother and deceased singer, Deatra Canter) and seeing the years in which they were born.  He also reported hearing voices through the air conditioner. He  denies SI, HI today.  He states that his mood and appetite are okay, and that he did not sleep so well to the night due to taking a nap yesterday. Patient endorses chronic knee pain but denies all other acute somatic symptoms and denies adverse effects of medication.  Baseline review of symptoms, specific for clozapine: Malaise/Sedation: Denies Chest pain: Denies Shortness of breath: Denies Exertional capacity: Denies Tachycardia: Denies Cough: Denies Sore Throat: Denies Fever: Denies Orthostatic hypotension (dizziness with standing): Denies Hypersalivation: Denies Constipation: Reports last bowel movement 2 days ago, but denies otherwise Symptoms of GERD: Denies Nausea: Denies Nocturnal enuresis: Denies   Principal Problem: Schizoaffective disorder, bipolar type ([place]) Diagnosis: Principal Problem:   Schizoaffective disorder, bipolar type ([place]) Active Problems:   Tobacco use disorder   Diabetes ([place])   Hyperlipidemia   Total Time spent with patient: 20 minutes  Past Psychiatric History: Previous Psych Diagnoses: Schizophrenia, Schizoaffective disorder-bipolar type Prior inpatient treatment: Multiple, [facility] 04/2018 Prior outpatient treatment: [facility] History of suicide: History of homicide: Denies Psychiatric medication history: Previously on 6/14 Zyprexa 5 mg qAM and 30 mg qHS; Prolixin 10 mg in 05/2020, Invega Sustenna, PO Invega, and Haldol (02/2020), Neurontin.  Currently taking Abilify Maintena 400 mg and Zyprexa 10 mg BID Psychiatric medication compliance history: Reports compliance, and picking up medications appropriately, per pharmacy  records Neuromodulation history: Denies Current Psychiatrist:[facility] services via [facility]  Past Medical History:  Past Medical History:  Diagnosis Date   Bipolar disorder ([place])    Bronchitis    Hyperlipidemia    Paranoid schizophrenia ([place])    Schizophrenia ([place])     Past Surgical History:  Procedure Laterality Date    TESTICLE IMPLANTATION TO THIGH     TESTICLE SURGERY     Family History:  Family History  Problem Relation Age of Onset   Schizophrenia Neg Hx    Family Psychiatric  History: See H&P Social History:  Social History   Substance and Sexual Activity  Alcohol Use Not Currently   Comment: weekly     Social History   Substance and Sexual Activity  Drug Use Yes   Types: Marijuana   Comment: smokes hemp    Social History   Socioeconomic History   Marital status: Single    Spouse name: Not on file   Number of children: Not on file   Years of education: 12 years   Highest education level: 12th grade  Occupational History   Occupation: On disability  Tobacco Use   Smoking status: Every Day    Packs/day: 1.00    Types: Cigarettes   Smokeless tobacco: Never  Vaping Use   Vaping Use: Never used  Substance and Sexual Activity   Alcohol use: Not Currently    Comment: weekly   Drug use: Yes    Types: Marijuana    Comment: smokes hemp   Sexual activity: Yes    Birth control/protection: Condom  Other Topics Concern   Not on file  Social History Narrative   Pt lives alone in apartment complex for mentally ill   Social Determinants of Health   Financial Resource Strain: Not on file  Food Insecurity: Not on file  Transportation Needs: Not on file  Physical Activity: Not on file  Stress: Not on file  Social Connections: Not on file   Additional Social History:       Sleep: Fair  Appetite:  Good  Current Medications: Current Facility-Administered Medications  Medication Dose Route Frequency Provider Last Rate Last Admin   alum & mag hydroxide-simeth (MAALOX/MYLANTA) 200-200-20 MG/5ML suspension 30 mL  30 mL Oral Q4H PRN Bobbitt, Shalon E, NP   30 mL at 12/11/21 1819   ARIPiprazole (ABILIFY) tablet 15 mg  15 mg Oral Once Rosezetta Schlatter, MD       ARIPiprazole ER (ABILIFY MAINTENA) injection 400 mg  400 mg Intramuscular Q28 days Massengill, Ovid Curd, MD   400 mg at  12/12/21 0935   atorvastatin (LIPITOR) tablet 40 mg  40 mg Oral QHS Bobbitt, Shalon E, NP   40 mg at 12/18/21 2037   benztropine (COGENTIN) tablet 1 mg  1 mg Oral BID PRN Harlow Asa, MD       Or   benztropine mesylate (COGENTIN) injection 1 mg  1 mg Intramuscular BID PRN Harlow Asa, MD       bismuth subsalicylate (PEPTO BISMOL) chewable tablet 524 mg  524 mg Oral Q4H PRN Nyoka Cowden, Terri L, RPH   524 mg at 12/18/21 1321   divalproex (DEPAKOTE) DR tablet 500 mg  500 mg Oral Q12H Massengill, Nathan, MD   500 mg at 12/18/21 2037   empagliflozin (JARDIANCE) tablet 10 mg  10 mg Oral Daily Rosezetta Schlatter, MD   10 mg at 12/18/21 0806   hydrOXYzine (ATARAX) tablet 25 mg  25 mg Oral TID PRN Bobbitt,  Shalon E, NP   25 mg at 12/19/21 0316   ibuprofen (ADVIL) tablet 800 mg  800 mg Oral BID PRN Bobbitt, Shalon E, NP   800 mg at 12/18/21 1839   magnesium hydroxide (MILK OF MAGNESIA) suspension 30 mL  30 mL Oral Daily PRN Bobbitt, Shalon E, NP   30 mL at 12/10/21 2029   melatonin tablet 5 mg  5 mg Oral QHS PRN Massengill, Ovid Curd, MD   5 mg at 12/18/21 2037   metFORMIN (GLUCOPHAGE) tablet 1,000 mg  1,000 mg Oral BID WC Bobbitt, Shalon E, NP   1,000 mg at 12/18/21 1606   Muscle Rub CREA   Topical PRN Rosezetta Schlatter, MD       nicotine (NICODERM CQ - dosed in mg/24 hours) patch 21 mg  21 mg Transdermal Daily Rosezetta Schlatter, MD   21 mg at 12/18/21 0807   OLANZapine (ZYPREXA) tablet 10 mg  10 mg Oral Daily Rosezetta Schlatter, MD   10 mg at 12/18/21 0806   OLANZapine (ZYPREXA) tablet 20 mg  20 mg Oral QHS Rosezetta Schlatter, MD   20 mg at 12/18/21 2037   OLANZapine zydis (ZYPREXA) disintegrating tablet 5 mg  5 mg Oral Q8H PRN Massengill, Ovid Curd, MD   5 mg at 12/19/21 3474   And   ziprasidone (GEODON) injection 20 mg  20 mg Intramuscular PRN Massengill, Ovid Curd, MD       sodium chloride (OCEAN) 0.65 % nasal spray 1 spray  1 spray Each Nare PRN Rosezetta Schlatter, MD   1 spray at 12/12/21 2037   temazepam (RESTORIL)  capsule 30 mg  30 mg Oral QHS PRN Harlow Asa, MD   30 mg at 12/18/21 2037   traZODone (DESYREL) tablet 100 mg  100 mg Oral QHS PRN Harlow Asa, MD   100 mg at 12/18/21 2037   white petrolatum (VASELINE) gel             Lab Results:  Results for orders placed or performed during the hospital encounter of 12/07/21 (from the past 48 hour(s))  Glucose, capillary     Status: None   Collection Time: 12/18/21  5:16 AM  Result Value Ref Range   Glucose-Capillary 88 70 - 99 mg/dL    Comment: Glucose reference range applies only to samples taken after fasting for at least 8 hours.  CBC with Differential/Platelet     Status: None   Collection Time: 12/18/21  6:28 AM  Result Value Ref Range   WBC 8.8 4.0 - 10.5 K/uL   RBC 4.50 4.22 - 5.81 MIL/uL   Hemoglobin 13.3 13.0 - 17.0 g/dL   HCT 39.6 39.0 - 52.0 %   MCV 88.0 80.0 - 100.0 fL   MCH 29.6 26.0 - 34.0 pg   MCHC 33.6 30.0 - 36.0 g/dL   RDW 12.1 11.5 - 15.5 %   Platelets 334 150 - 400 K/uL   nRBC 0.0 0.0 - 0.2 %   Neutrophils Relative % 62 %   Neutro Abs 5.5 1.7 - 7.7 K/uL   Lymphocytes Relative 30 %   Lymphs Abs 2.7 0.7 - 4.0 K/uL   Monocytes Relative 4 %   Monocytes Absolute 0.4 0.1 - 1.0 K/uL   Eosinophils Relative 2 %   Eosinophils Absolute 0.2 0.0 - 0.5 K/uL   Basophils Relative 1 %   Basophils Absolute 0.1 0.0 - 0.1 K/uL   Immature Granulocytes 1 %   Abs Immature Granulocytes 0.06 0.00 - 0.07 K/uL  Comment: Performed at [facility], [place] [address], [place], [place] [address]  C-reactive protein     Status: None   Collection Time: 12/18/21  6:28 AM  Result Value Ref Range   CRP 0.7 <1.0 mg/dL    Comment: Performed at [facility], [place] [address] [place], [place] [address]  Sedimentation rate     Status: Abnormal   Collection Time: 12/18/21  6:28 AM  Result Value Ref Range   Sed Rate 18 (H) 0 - 16 mm/hr    Comment: Performed at [facility], [place] [address]  [place], [place] [address]  Brain natriuretic peptide     Status: None   Collection Time: 12/18/21  6:28 AM  Result Value Ref Range   B Natriuretic Peptide 23.1 0.0 - 100.0 pg/mL    Comment: Performed at [facility], [place] [address] [place], [place] [address]  Comprehensive metabolic panel     Status: Abnormal   Collection Time: 12/18/21  6:28 AM  Result Value Ref Range   Sodium 143 135 - 145 mmol/L   Potassium 4.1 3.5 - 5.1 mmol/L   Chloride 110 98 - 111 mmol/L   CO2 26 22 - 32 mmol/L   Glucose, Bld 135 (H) 70 - 99 mg/dL    Comment: Glucose reference range applies only to samples taken after fasting for at least 8 hours.   BUN 9 6 - 20 mg/dL   Creatinine, Ser 0.98 0.61 - 1.24 mg/dL   Calcium 9.1 8.9 - 10.3 mg/dL   Total Protein 7.0 6.5 - 8.1 g/dL   Albumin 3.8 3.5 - 5.0 g/dL   AST 14 (L) 15 - 41 U/L   ALT 15 0 - 44 U/L   Alkaline Phosphatase 57 38 - 126 U/L   Total Bilirubin 0.2 (L) 0.3 - 1.2 mg/dL   GFR, Estimated >60 >60 mL/min    Comment: (NOTE) Calculated using the CKD-EPI Creatinine Equation (2021)    Anion gap 7 5 - 15    Comment: Performed at [facility], [place] [address] [place], [place] [address]  Troponin I (High Sensitivity)     Status: None   Collection Time: 12/18/21  6:28 AM  Result Value Ref Range   Troponin I (High Sensitivity) 2 <18 ng/L    Comment: (NOTE) Elevated high sensitivity troponin I (hsTnI) values and significant  changes across serial measurements may suggest ACS but many other  chronic and acute conditions are known to elevate hsTnI results.  Refer to the "Links" section for chest pain algorithms and additional  guidance. Performed at [facility], [place] [address] [place], [place] [address]   Glucose, capillary     Status: None   Collection Time: 12/19/21  5:10 AM  Result Value Ref Range   Glucose-Capillary 98 70 - 99 mg/dL    Comment: Glucose reference range applies only to samples taken  after fasting for at least 8 hours.    Blood Alcohol level:  Lab Results  Component Value Date   ETH <10 11/28/2021   ETH <10 59/16/3846    Metabolic Disorder Labs: Lab Results  Component Value Date   HGBA1C 6.9 (H) 11/17/2021   MPG 151.33 11/17/2021   MPG 111.15 02/03/2020   Lab Results  Component Value Date   PROLACTIN 8.1 06/01/2019   Lab Results  Component Value Date   CHOL 130 12/01/2021   TRIG 172 (H) 12/01/2021   HDL 28 (  L) 12/01/2021   CHOLHDL 4.6 12/01/2021   VLDL 34 12/01/2021   LDLCALC 68 12/01/2021   LDLCALC 97 02/03/2020    Physical Findings:  Musculoskeletal: Strength & Muscle Tone: within normal limits Gait & Station: normal Patient leans: N/A  Psychiatric Specialty Exam:  Presentation  General Appearance: Appropriate for Environment; Casual   Eye Contact:Good   Speech:Clear and Coherent; Normal Rate   Speech Volume:Normal   Handedness:Right    Mood and Affect  Mood:Euthymic   Affect:Blunt    Thought Process  Thought Processes:Disorganized   Descriptions of Associations:Tangential   Orientation:Full (Time, Place and Person)   Thought Content:Scattered; Tangential; Illogical   History of Schizophrenia/Schizoaffective disorder:Yes   Duration of Psychotic Symptoms:Greater than six months   Hallucinations:No data recorded    Ideas of Reference:None   Suicidal Thoughts:No data recorded    Homicidal Thoughts:No data recorded     Sensorium  Memory:Immediate Fair; Recent Fair   Judgment:Impaired   Insight:Poor; Shallow    Executive Functions  Concentration: Poor   Attention Span: Poor   Recall:Fair   Fund of Knowledge:Fair   Language:Fair    Psychomotor Activity  Psychomotor Activity:No data recorded     Assets  Assets:Desire for Improvement; Resilience; Social Support    Sleep  Sleep:Sleep: Better      Physical Exam: Physical Exam Vitals reviewed.   Constitutional:      General: He is not in acute distress.    Appearance: He is normal weight. He is not toxic-appearing.  HENT:     Head: Normocephalic and atraumatic.     Mouth/Throat:     Mouth: Mucous membranes are moist.     Pharynx: Oropharynx is clear.  Pulmonary:     Effort: Pulmonary effort is normal. No respiratory distress.  Skin:    General: Skin is warm and dry.  Neurological:     Mental Status: He is alert.     Motor: No weakness.     Gait: Gait normal.  Psychiatric:        Mood and Affect: Mood normal.        Behavior: Behavior normal.        Judgment: Judgment normal.    Review of Systems  Constitutional:  Negative for chills and fever.  Cardiovascular:  Negative for chest pain and palpitations.  Gastrointestinal: Negative.   Genitourinary: Negative.   Musculoskeletal: Negative.   Neurological:  Negative for dizziness, tingling, tremors and headaches.  Psychiatric/Behavioral:  Negative for depression, hallucinations, memory loss, substance abuse and suicidal ideas. The patient is not nervous/anxious and does not have insomnia.    Blood pressure 125/67, pulse 93, temperature 97.7 F (36.5 C), temperature source Oral, resp. rate 18, height '6\' 1"'  (1.854 m), weight 117.9 kg, SpO2 99 %. Body mass index is 34.3 kg/m.   Treatment Plan Summary: ASSESSMENT: Principal Problem:   Schizoaffective disorder, bipolar type ([place]) Active Problems:   Tobacco use disorder   Patient appears to have minimal improvements on current regimen, which leads [place] to believe that although he picked up his medications on time, he has not been compliant with taking them. ACT team confirmed that patient has not had LAI per the records since 6/1.  Concerns that since beginning Abilify, patient has not been improving; tapered off Abilify, and workup for Clozaril complete, so will switch to Clozaril especially now that patient has failed multiple antipsychotics.   PLAN: Safety and  Monitoring:             --  Involuntary admission to inpatient psychiatric unit for safety, stabilization and treatment             -- Daily contact with patient to assess and evaluate symptoms and progress in treatment             -- Patient's case to be discussed in multi-disciplinary team meeting             -- Observation Level : q15 minute checks             -- Vital signs:  q12 hours             -- Precautions: suicide, elopement, and assault   2. Psychiatric Diagnoses and Treatment:  # Schizoaffective disorder-bipolar type - Continue Zyprexa 10 mg every morning and 20 mg nightly.  -- Patient currently on Abilify Maintena 400 mg LAI, last given 8/16 -Will decrease p.o. Abilify to 15 mg on 8/23, then discontinue. -START Clozaril 12.5 mg tonight, with plan to increase by 12.5 mg nightly.  -Clozaril to be started as patient has failed Invega, Haldol, Prolixin, and Abilify.  -- Labs: CBC (WNL, ANC 5.5), ESR 18, BNP 23.1, Trop 2, CMP/ LFTs (WNL), CRP 0.7, EKG WNL  -- Patient will likely have to also have addition of an FGA, on triple antipsychotic therapy. Will monitor upon initiation of Clozaril. -Continue Depakote DR 500 mg bid due to elevated ammonia level; patient asymptomatic, and with ongoing psychosis despite ammonia level decreasing from 45->43->39, ammonia is a less likely cause of confusion/psychosis.    PRN: Agitation protocol with Zyprexa 10 mg, Geodon 20 mg, and Ativan 1 mg   -- The risks/benefits/side-effects/alternatives to this medication were discussed in detail with the patient and time was given for questions. The patient consents to medication trial.              -- Metabolic profile and EKG monitoring obtained while on an atypical antipsychotic  BMI: 34.3 Lipid Panel: TG 172 HbgA1c: 6.9% QTc: 413             -- Encouraged patient to participate in unit milieu and in scheduled group therapies      3. Medical Issues Being Addressed: #Nicotine use disorder -  Nicotine patch 21 mg daily   #T2DM A.m. CBG 98. Home medication Synjardi 01/999 mg BID.  Due to the fact that patient is actively psychotic, only completing a.m. sleep e.g. monitoring.  Patient on home regimen and diabetes well-controlled (A1c 6.9%), so minimal concern for decompensation. -Metformin 1000 mg twice daily with meals - Jardiance 10 mg every morning - A.m. CBG monitoring only. SSI was implemented and discontinued.   #Hyperlipidemia - Continue home atorvastatin 40 mg daily  4. Discharge Planning:               -- Social work and case management to assist with discharge planning and identification of hospital follow-up needs prior to discharge             -- Estimated LOS: 10-14 additional days while titrating Clozaril             -- Discharge Concerns: Need to establish a safety plan; Medication compliance and effectiveness             -- Discharge Goals: Return home with outpatient referrals for mental health follow-up including medication management/psychotherapy   Rosezetta Schlatter, MD 12/19/2021, 7:30 AM

## 2021-12-19 NOTE — Progress Notes (Signed)
Pt reports he slept "awful, I was up just turning around in the bed. It was very awful. I woke up with the spirits though, my brother's spirit came in the room and woke me up. I can raise the dead, I'm telling you". Reports good appetite, however, pt remains very anxious [place] with intermittent pacing in milieu. Continues to respond to internal stimuli while pacing hallway "I can fight mother-fucker, I take a 500 lbs man down damn it". Continues to need multiple verbal redirections. Attended scheduled groups, participates in activities on and off unit, returns without issues. Concerns validated, treatment team made aware of sleep disturbance. Support, encouragement and reassurance provided to pt. Safety checks maintained at Q 15 minutes intervals without incident. Pt is compliant with his current medication regimen. Tolerates all PO intake well without discomfort. Cooperative with unit routines and interacts well with peers and staff.

## 2021-12-19 NOTE — Group Note (Signed)
Recreation Therapy Group Note   Group Topic:Team Building  Group Date: 12/19/2021 Start Time: 1000 End Time: 1045 Facilitators: Caroll Rancher, LRT,CTRS Location: 500 Hall Dayroom   Goal Area(s) Addresses:  Patient will effectively work with peer towards shared goal.  Patient will identify skills used to make activity successful.  Patient will identify how skills used during activity can be applied to reach post d/c goals.   Group Description: [employer]. In teams of 5-6, patients were given 12 craft pipe cleaners. Using the materials provided, patients were instructed to compete again the opposing team(s) to build the tallest free-standing structure from floor level. The activity was timed; difficulty increased by Clinical research associate as Production designer, theatre/television/film continued.  Systematically resources were removed with additional directions for example, placing one arm behind their back, working in silence, and shape stipulations. LRT facilitated post-activity discussion reviewing team processes and necessary communication skills involved in completion. Patients were encouraged to reflect how the skills utilized, or not utilized, in this activity can be incorporated to positively impact support systems post discharge.   Affect/Mood: Appropriate   Participation Level: None   Participation Quality: Independent   Behavior: Disinterested   Speech/Thought Process: Delusional   Insight: Lacking   Judgement: Lacking    Modes of Intervention: Team-building   Patient Response to Interventions:  Disengaged   Education Outcome:  Acknowledges education and In group clarification offered    Clinical Observations/Individualized Feedback: Pt attended group but made no effort to participate.  Pt was sitting, smiling and talking to himself.  Pt eventually, made some coffee then left and did not return.    Plan: Continue to engage patient in RT group sessions 2-3x/week.   Caroll Rancher,  Antonietta Jewel 12/19/2021 12:39 PM

## 2021-12-19 NOTE — Progress Notes (Signed)
   12/19/21 2000  Psych Admission Type (Psych Patients Only)  Admission Status Involuntary  Psychosocial Assessment  Patient Complaints Anxiety  Eye Contact Fair  Facial Expression Animated  Affect Anxious;Appropriate to circumstance  Speech Tangential  Interaction Assertive  Motor Activity Pacing  Appearance/Hygiene Disheveled  Behavior Characteristics Cooperative  Mood Suspicious;Preoccupied  Aggressive Behavior  Effect No apparent injury  Thought Process  Coherency Disorganized  Content Delusions  Delusions Paranoid  Perception Hallucinations  Hallucination Auditory;Visual  Judgment Limited  Confusion None  Danger to Self  Current suicidal ideation? Denies  Danger to Others  Danger to Others None reported or observed

## 2021-12-19 NOTE — [facility] Group Notes (Signed)
Pt didn't attend group. 

## 2021-12-19 NOTE — BH IP Treatment Plan (Signed)
Interdisciplinary Treatment and Diagnostic Plan Update  12/19/2021 Time of Session: 10:10am  [name] MRN: [id number]  Principal Diagnosis: Schizoaffective disorder, bipolar type [facility])  Secondary Diagnoses: Principal Problem:   Schizoaffective disorder, bipolar type (HCC) Active Problems:   Tobacco use disorder   Diabetes (HCC)   Hyperlipidemia   Current Medications:  Current Facility-Administered Medications  Medication Dose Route Frequency Provider Last Rate Last Admin   alum & mag hydroxide-simeth (MAALOX/MYLANTA) 200-200-20 MG/5ML suspension 30 mL  30 mL Oral Q4H PRN Bobbitt, Shalon E, NP   30 mL at 12/11/21 1819   ARIPiprazole ER (ABILIFY MAINTENA) injection 400 mg  400 mg Intramuscular Q28 days Massengill, Harrold Donath, MD   400 mg at 12/12/21 0935   atorvastatin (LIPITOR) tablet 40 mg  40 mg Oral QHS Bobbitt, Shalon E, NP   40 mg at 12/18/21 2037   benztropine (COGENTIN) tablet 1 mg  1 mg Oral BID PRN Comer Locket, MD   1 mg at 12/19/21 5188   Or   benztropine mesylate (COGENTIN) injection 1 mg  1 mg Intramuscular BID PRN Comer Locket, MD       bismuth subsalicylate (PEPTO BISMOL) chewable tablet 524 mg  524 mg Oral Q4H PRN Chilton Si, Terri L, RPH   524 mg at 12/18/21 1321   divalproex (DEPAKOTE) DR tablet 500 mg  500 mg Oral Q12H Massengill, Nathan, MD   500 mg at 12/19/21 0815   empagliflozin (JARDIANCE) tablet 10 mg  10 mg Oral Daily Lamar Sprinkles, MD   10 mg at 12/19/21 0814   hydrOXYzine (ATARAX) tablet 25 mg  25 mg Oral TID PRN Bobbitt, Shalon E, NP   25 mg at 12/19/21 0316   ibuprofen (ADVIL) tablet 800 mg  800 mg Oral BID PRN Bobbitt, Shalon E, NP   800 mg at 12/19/21 1046   magnesium hydroxide (MILK OF MAGNESIA) suspension 30 mL  30 mL Oral Daily PRN Bobbitt, Shalon E, NP   30 mL at 12/10/21 2029   melatonin tablet 5 mg  5 mg Oral QHS PRN Massengill, Harrold Donath, MD   5 mg at 12/18/21 2037   metFORMIN (GLUCOPHAGE) tablet 1,000 mg  1,000 mg Oral BID WC Bobbitt, Shalon  E, NP   1,000 mg at 12/19/21 4166   Muscle Rub CREA   Topical PRN Lamar Sprinkles, MD       nicotine (NICODERM CQ - dosed in mg/24 hours) patch 21 mg  21 mg Transdermal Daily Lamar Sprinkles, MD   21 mg at 12/19/21 0828   OLANZapine (ZYPREXA) tablet 10 mg  10 mg Oral Daily Lamar Sprinkles, MD   10 mg at 12/19/21 0813   OLANZapine (ZYPREXA) tablet 20 mg  20 mg Oral QHS Lamar Sprinkles, MD   20 mg at 12/18/21 2037   OLANZapine zydis (ZYPREXA) disintegrating tablet 5 mg  5 mg Oral Q8H PRN Massengill, Harrold Donath, MD   5 mg at 12/19/21 0316   And   ziprasidone (GEODON) injection 20 mg  20 mg Intramuscular PRN Massengill, Harrold Donath, MD       sodium chloride (OCEAN) 0.65 % nasal spray 1 spray  1 spray Each Nare PRN Lamar Sprinkles, MD   1 spray at 12/12/21 2037   temazepam (RESTORIL) capsule 30 mg  30 mg Oral QHS PRN Comer Locket, MD   30 mg at 12/18/21 2037   traZODone (DESYREL) tablet 100 mg  100 mg Oral QHS PRN Comer Locket, MD   100 mg at 12/18/21 2037  PTA Medications: Medications Prior to Admission  Medication Sig Dispense Refill Last Dose   ABILIFY MAINTENA 400 MG PRSY prefilled syringe Inject 400 mg into the muscle every 28 (twenty-eight) days.      atorvastatin (LIPITOR) 40 MG tablet Take 40 mg by mouth at bedtime.      ibuprofen (ADVIL) 800 MG tablet Take 800 mg by mouth 2 (two) times daily as needed for fever, mild pain or headache.      metFORMIN (GLUCOPHAGE) 500 MG tablet Take 2 tablets (1,000 mg total) by mouth 2 (two) times daily with a meal. 60 tablet 0    naproxen (NAPROSYN) 500 MG tablet Take 1 tablet (500 mg total) by mouth 2 (two) times daily. 10 tablet 0     Patient Stressors: Medication change or noncompliance    Patient Strengths: Capable of independent living  Motivation for treatment/growth  Supportive family/friends   Treatment Modalities: Medication Management, Group therapy, Case management,  1 to 1 session with clinician, Psychoeducation, Recreational  therapy.   Physician Treatment Plan for Primary Diagnosis: Schizoaffective disorder, bipolar type (HCC) Long Term Goal(s): Improvement in symptoms so as ready for discharge   Short Term Goals: Ability to identify changes in lifestyle to reduce recurrence of condition will improve Ability to verbalize feelings will improve Ability to demonstrate self-control will improve Ability to identify and develop effective coping behaviors will improve Compliance with prescribed medications will improve  Medication Management: Evaluate patient's response, side effects, and tolerance of medication regimen.  Therapeutic Interventions: 1 to 1 sessions, Unit Group sessions and Medication administration.  Evaluation of Outcomes: Progressing  Physician Treatment Plan for Secondary Diagnosis: Principal Problem:   Schizoaffective disorder, bipolar type (HCC) Active Problems:   Tobacco use disorder   Diabetes (HCC)   Hyperlipidemia  Long Term Goal(s): Improvement in symptoms so as ready for discharge   Short Term Goals: Ability to identify changes in lifestyle to reduce recurrence of condition will improve Ability to verbalize feelings will improve Ability to demonstrate self-control will improve Ability to identify and develop effective coping behaviors will improve Compliance with prescribed medications will improve     Medication Management: Evaluate patient's response, side effects, and tolerance of medication regimen.  Therapeutic Interventions: 1 to 1 sessions, Unit Group sessions and Medication administration.  Evaluation of Outcomes: Progressing   RN Treatment Plan for Primary Diagnosis: Schizoaffective disorder, bipolar type (HCC) Long Term Goal(s): Knowledge of disease and therapeutic regimen to maintain health will improve  Short Term Goals: Ability to remain free from injury will improve, Ability to participate in decision making will improve, Ability to verbalize feelings will  improve, Ability to disclose and discuss suicidal ideas, and Ability to identify and develop effective coping behaviors will improve  Medication Management: RN will administer medications as ordered by provider, will assess and evaluate patient's response and provide education to patient for prescribed medication. RN will report any adverse and/or side effects to prescribing provider.  Therapeutic Interventions: 1 on 1 counseling sessions, Psychoeducation, Medication administration, Evaluate responses to treatment, Monitor vital signs and CBGs as ordered, Perform/monitor CIWA, COWS, AIMS and Fall Risk screenings as ordered, Perform wound care treatments as ordered.  Evaluation of Outcomes: Progressing   LCSW Treatment Plan for Primary Diagnosis: Schizoaffective disorder, bipolar type (HCC) Long Term Goal(s): Safe transition to appropriate next level of care at discharge, Engage patient in therapeutic group addressing interpersonal concerns.  Short Term Goals: Engage patient in aftercare planning with referrals and resources, Increase social support, Increase emotional  regulation, Facilitate acceptance of mental health diagnosis and concerns, Identify triggers associated with mental health/substance abuse issues, and Increase skills for wellness and recovery  Therapeutic Interventions: Assess for all discharge needs, 1 to 1 time with Social worker, Explore available resources and support systems, Assess for adequacy in community support network, Educate family and significant other(s) on suicide prevention, Complete Psychosocial Assessment, Interpersonal group therapy.  Evaluation of Outcomes: Progressing   Progress in Treatment: Attending groups: Yes. Participating in groups: Yes. Taking medication as prescribed: Yes. Toleration medication: Yes. Family/Significant other contact made: Yes, will contact:  Father  Patient understands diagnosis: Yes. Discussing patient identified problems/goals  with staff: Yes. Medical problems stabilized or resolved: Yes. Denies suicidal/homicidal ideation: No. Issues/concerns per patient self-inventory: Yes. Other: none   New problem(s) identified: No, Describe:  none   New Short Term/Long Term Goal(s): Patient to work towards elimination of symptoms of psychosis, medication management for mood stabilization; elimination of SI thoughts; development of comprehensive mental wellness plan.   Patient Goals: Patient states their goal for treatment is to "make sure medications are the same."   Discharge Plan or Barriers: No psychosocial barriers identified at this time, patient to return to place of residence when appropriate for discharge.    Reason for Continuation of Hospitalization: Other; describe psychosis   Estimated Length of Stay: 1-7 days     Last 3 [place] Suicide Severity Risk Score: [facility]mission (Current) from 12/07/2021 in [facility] ED from 11/29/2021 in [place] ED from 11/28/2021 in [facility] No Risk No Risk No Risk       Last PHQ 2/9 Scores:    11/02/2021    6:10 PM  Depression screen PHQ 2/9  Decreased Interest 1  Down, Depressed, Hopeless 0  PHQ - 2 Score 1    Scribe for Treatment Team: Darleen Crocker, Latanya Presser 12/19/2021 11:26 AM

## 2021-12-19 NOTE — Progress Notes (Signed)
   12/19/21 0515  Sleep  Number of Hours 3.75

## 2021-12-19 NOTE — [facility] Group Notes (Signed)
Adult Psychoeducational Group Note  Date:  12/19/2021 Time:  9:36 AM  Group Topic/Focus:  Goals Group:   The focus of this group is to help patients establish daily goals to achieve during treatment and discuss how the patient can incorporate goal setting into their daily lives to aide in recovery.  Participation Level:  Active  Participation Quality:  Appropriate  Additional Comments:  Pt has a goal today to, "Be good always and go home."  Margaret Pyle 12/19/2021, 9:36 AM

## 2021-12-20 DIAGNOSIS — F17200 Nicotine dependence, unspecified, uncomplicated: Secondary | ICD-10-CM | POA: Diagnosis not present

## 2021-12-20 DIAGNOSIS — F250 Schizoaffective disorder, bipolar type: Secondary | ICD-10-CM | POA: Diagnosis not present

## 2021-12-20 DIAGNOSIS — E119 Type 2 diabetes mellitus without complications: Secondary | ICD-10-CM | POA: Diagnosis not present

## 2021-12-20 LAB — GLUCOSE, CAPILLARY: Glucose-Capillary: 110 mg/dL — ABNORMAL HIGH (ref 70–99)

## 2021-12-20 MED ORDER — WHITE PETROLATUM EX OINT
TOPICAL_OINTMENT | CUTANEOUS | Status: AC
  Administered 2021-12-20: 1
  Filled 2021-12-20: qty 5

## 2021-12-20 MED ORDER — OLANZAPINE 10 MG IM SOLR: 5.0000 mg | Freq: Three times a day (TID) | INTRAMUSCULAR | Status: DC | PRN

## 2021-12-20 MED ORDER — POLYETHYLENE GLYCOL 3350 17 G PO PACK
17.0000 g | PACK | Freq: Every day | ORAL | Status: DC
  Administered 2021-12-20: 17 g via ORAL
  Filled 2021-12-20 (×4): qty 1

## 2021-12-20 MED ORDER — DOCUSATE SODIUM 100 MG PO CAPS
100.0000 mg | ORAL_CAPSULE | Freq: Two times a day (BID) | ORAL | Status: DC
  Administered 2021-12-20: 100 mg via ORAL
  Filled 2021-12-20 (×5): qty 1

## 2021-12-20 NOTE — Progress Notes (Signed)
   12/20/21 0500  Sleep  Number of Hours 6

## 2021-12-20 NOTE — [facility] Group Notes (Signed)
Adult Psychoeducational Group Note  Date:  12/20/2021 Time:  8:13 PM  Group Topic/Focus:  Wrap-Up Group:   The focus of this group is to help patients review their daily goal of treatment and discuss progress on daily workbooks.  Participation Level:  Active  Participation Quality:  Redirectable  Affect:  Labile  Cognitive:  Disorganized  Insight: Lacking  Engagement in Group:  Off Topic  Modes of Intervention:  Discussion  Additional Comments:  Patient attended and participated in the Wrap-up group.  Jearl Klinefelter 12/20/2021, 8:13 PM

## 2021-12-20 NOTE — Progress Notes (Signed)
   12/20/21 2000  Psych Admission Type (Psych Patients Only)  Admission Status Involuntary  Psychosocial Assessment  Patient Complaints Anxiety  Eye Contact Fair  Facial Expression Animated  Affect Anxious;Appropriate to circumstance  Child psychotherapist Assertive  Motor Activity Pacing  Appearance/Hygiene Disheveled  Behavior Characteristics Cooperative  Mood Labile;Suspicious  Aggressive Behavior  Effect No apparent injury  Thought Process  Coherency Disorganized  Content Delusions  Delusions Paranoid  Perception Hallucinations  Hallucination Auditory;Visual  Judgment Limited  Confusion None  Danger to Self  Current suicidal ideation? Denies  Danger to Others  Danger to Others None reported or observed

## 2021-12-20 NOTE — Progress Notes (Signed)
Pt received PRN Vistaril 25 mg and Zyprexa 5 mg PO at 1136 for escalating anxiety and agitation. Pt observed pacing hall, responding to internal stimuli, progressively loud and abusive. Pt calmer, verbally redirectable when reassessed at 1230. Required multiple redirections to comply with groups. Preoccupied about toileting; had 2 BMs this shift. Off unit for meals and leisure activities, returned without issues. Denies concerns at this time.

## 2021-12-20 NOTE — Group Note (Signed)
Occupational Therapy Group Note  Group Topic:Stress Management  Group Date: 12/20/2021 Start Time: 1400 End Time: 1455 Facilitators: Ted Mcalpine, OT   Group Description: Group encouraged increased participation and engagement through discussion focused on topic of stress management. Patients engaged interactively to discuss components of stress including physical signs, emotional signs, negative management strategies, and positive management strategies. Each individual identified one new stress management strategy they would like to try moving forward.    Therapeutic Goals: Identify current stressors Identify healthy vs unhealthy stress management strategies/techniques Discuss and identify physical and emotional signs of stress   Participation Level: Active   Participation Quality: Independent   Behavior: Hyperverbal and Impulsive   Speech/Thought Process: Disorganized, Ideas of reference , and Loose association    Affect/Mood: Flat   Insight: Impaired   Judgement: Impaired   Individualization: pt was active and engaged in their participation of group discussion/activity. New skills were identified  Modes of Intervention: Discussion and Education  Patient Response to Interventions:  Engaged   Plan: Continue to engage patient in OT groups 2 - 3x/week.  12/20/2021  Ted Mcalpine, OT Kerrin Champagne, OT

## 2021-12-20 NOTE — Progress Notes (Signed)
[facility] MD Progress Note  12/20/2021 4:42 PM [name]  MRN:  [id number] Subjective:  [name] is a 46 year old male with a reported past psychiatric history of schizophrenia-paranoid, bipolar disorder housing insecurity who presented to [facility] ED on 8/3 under IVC for psychosis and bizarre behaviors.    Yesterday's Recommendations per Psychiatry Team: - Continue Zyprexa 10 mg every morning and 20 mg nightly.  -- Patient currently on Abilify Maintena 400 mg LAI, last given 8/16 -Will decrease p.o. Abilify to 15 mg on 8/23, then discontinue. -START Clozaril 12.5 mg tonight, with plan to increase by 12.5 mg nightly. -Continue Depakote DR 500 mg bid due to elevated ammonia level; p     On Evaluation Today: Patient remains psychotic.  He is disorganized and responding to internal stimuli.  His statements are bizarre and not founded in reality for majority of the conversation.  He reports that his mood is euthymic.  Anxiety level is low.  He does report having AH today.  Denies VH.  Reports sleep is better.  Denies SI and HI.  Reports appetite is okay.  Reports no BM in 3 days.  Otherwise denies side effects to current scheduled psychiatric medications.   Baseline review of symptoms, specific for clozapine: Malaise/Sedation: Denies Chest pain: Denies Shortness of breath: Denies Exertional capacity: Denies Tachycardia: Denies Cough: Denies Sore Throat: Denies Fever: Denies Orthostatic hypotension (dizziness with standing): Denies Hypersalivation: Denies Constipation: Reports last bowel movement 3 days ago Symptoms of GERD: Denies Nausea: Denies Nocturnal enuresis: Denies   Principal Problem: Schizoaffective disorder, bipolar type (HCC) Diagnosis: Principal Problem:   Schizoaffective disorder, bipolar type (HCC) Active Problems:   Tobacco use disorder   Diabetes (HCC)   Hyperlipidemia   Total Time spent with patient: 20 minutes  Past Psychiatric History: Previous Psych  Diagnoses: Schizophrenia, Schizoaffective disorder-bipolar type Prior inpatient treatment: Multiple, [facility] 04/2018 Prior outpatient treatment: [facility] History of suicide: History of homicide: Denies Psychiatric medication history: Previously on 6/14 Zyprexa 5 mg qAM and 30 mg qHS; Prolixin 10 mg in 05/2020, Invega Sustenna, PO Invega, and Haldol (02/2020), Neurontin.  Currently taking Abilify Maintena 400 mg and Zyprexa 10 mg BID Psychiatric medication compliance history: Reports compliance, and picking up medications appropriately, per pharmacy records Neuromodulation history: Denies Current Psychiatrist:ACTT services via [name]  Past Medical History:  Past Medical History:  Diagnosis Date   Bipolar disorder (HCC)    Bronchitis    Hyperlipidemia    Paranoid schizophrenia (HCC)    Schizophrenia (HCC)     Past Surgical History:  Procedure Laterality Date   TESTICLE IMPLANTATION TO THIGH     TESTICLE SURGERY     Family History:  Family History  Problem Relation Age of Onset   Schizophrenia Neg Hx    Family Psychiatric  History: See H&P Social History:  Social History   Substance and Sexual Activity  Alcohol Use Not Currently   Comment: weekly     Social History   Substance and Sexual Activity  Drug Use Yes   Types: Marijuana   Comment: smokes hemp    Social History   Socioeconomic History   Marital status: Single    Spouse name: Not on file   Number of children: Not on file   Years of education: 12 years   Highest education level: 12th grade  Occupational History   Occupation: On disability  Tobacco Use   Smoking status: Every Day    Packs/day: 1.00    Types: Cigarettes  Smokeless tobacco: Never  Vaping Use   Vaping Use: Never used  Substance and Sexual Activity   Alcohol use: Not Currently    Comment: weekly   Drug use: Yes    Types: Marijuana    Comment: smokes hemp   Sexual activity: Yes    Birth control/protection: Condom  Other Topics  Concern   Not on file  Social History Narrative   Pt lives alone in apartment complex for mentally ill   Social Determinants of Health   Financial Resource Strain: Not on file  Food Insecurity: Not on file  Transportation Needs: Not on file  Physical Activity: Not on file  Stress: Not on file  Social Connections: Not on file   Additional Social History:       Sleep: Fair  Appetite:  Good  Current Medications: Current Facility-Administered Medications  Medication Dose Route Frequency Provider Last Rate Last Admin   alum & mag hydroxide-simeth (MAALOX/MYLANTA) 200-200-20 MG/5ML suspension 30 mL  30 mL Oral Q4H PRN Bobbitt, Shalon E, NP   30 mL at 12/20/21 1135   atorvastatin (LIPITOR) tablet 40 mg  40 mg Oral QHS Bobbitt, Shalon E, NP   40 mg at 12/19/21 2049   benztropine (COGENTIN) tablet 1 mg  1 mg Oral BID PRN Harlow Asa, MD   1 mg at 12/19/21 [id number]   Or   benztropine mesylate (COGENTIN) injection 1 mg  1 mg Intramuscular BID PRN Harlow Asa, MD       bismuth subsalicylate (PEPTO BISMOL) chewable tablet 524 mg  524 mg Oral Q4H PRN Minda Ditto, RPH   524 mg at 12/18/21 1321   cloZAPine (CLOZARIL) tablet 25 mg  25 mg Oral QHS Rosezetta Schlatter, MD       Followed by   Derrill Memo ON 12/21/2021] cloZAPine (CLOZARIL) tablet 37.5 mg  37.5 mg Oral QHS Rosezetta Schlatter, MD       Followed by   Derrill Memo ON 12/22/2021] cloZAPine (CLOZARIL) tablet 50 mg  50 mg Oral QHS Rosezetta Schlatter, MD       Followed by   Derrill Memo ON 12/23/2021] cloZAPine (CLOZARIL) tablet 62.5 mg  62.5 mg Oral QHS Rosezetta Schlatter, MD       Followed by   Derrill Memo ON 12/24/2021] cloZAPine (CLOZARIL) tablet 75 mg  75 mg Oral QHS Rosezetta Schlatter, MD       Followed by   Derrill Memo ON 12/25/2021] cloZAPine (CLOZARIL) tablet 87.5 mg  87.5 mg Oral QHS Rosezetta Schlatter, MD       Followed by   Derrill Memo ON 12/26/2021] cloZAPine (CLOZARIL) tablet 100 mg  100 mg Oral QHS Rosezetta Schlatter, MD       divalproex (DEPAKOTE ER) 24 hr tablet  1,000 mg  1,000 mg Oral QHS Bless Belshe, MD       docusate sodium (COLACE) capsule 100 mg  100 mg Oral BID Chong Wojdyla, MD       empagliflozin (JARDIANCE) tablet 10 mg  10 mg Oral Daily Rosezetta Schlatter, MD   10 mg at 12/20/21 0850   hydrOXYzine (ATARAX) tablet 25 mg  25 mg Oral TID PRN Bobbitt, Shalon E, NP   25 mg at 12/20/21 1135   ibuprofen (ADVIL) tablet 800 mg  800 mg Oral BID PRN Bobbitt, Shalon E, NP   800 mg at 12/20/21 0458   magnesium hydroxide (MILK OF MAGNESIA) suspension 30 mL  30 mL Oral Daily PRN Bobbitt, Shalon E, NP   30 mL at 12/10/21 2029  melatonin tablet 5 mg  5 mg Oral QHS PRN Patrica Mendell, Harrold Donath, MD   5 mg at 12/19/21 2049   metFORMIN (GLUCOPHAGE) tablet 1,000 mg  1,000 mg Oral BID WC Bobbitt, Shalon E, NP   1,000 mg at 12/20/21 0850   Muscle Rub CREA   Topical PRN Lamar Sprinkles, MD       nicotine (NICODERM CQ - dosed in mg/24 hours) patch 21 mg  21 mg Transdermal Daily Lamar Sprinkles, MD   21 mg at 12/20/21 0853   OLANZapine (ZYPREXA) injection 5 mg  5 mg Intramuscular Q8H PRN Shantelle Alles, Harrold Donath, MD       OLANZapine (ZYPREXA) tablet 10 mg  10 mg Oral Daily Lamar Sprinkles, MD   10 mg at 12/20/21 0850   OLANZapine (ZYPREXA) tablet 20 mg  20 mg Oral QHS Lamar Sprinkles, MD   20 mg at 12/19/21 2048   OLANZapine zydis (ZYPREXA) disintegrating tablet 5 mg  5 mg Oral Q8H PRN Rayon Mcchristian, Harrold Donath, MD   5 mg at 12/20/21 1136   polyethylene glycol (MIRALAX / GLYCOLAX) packet 17 g  17 g Oral Daily Evetta Renner, MD       sodium chloride (OCEAN) 0.65 % nasal spray 1 spray  1 spray Each Nare PRN Lamar Sprinkles, MD   1 spray at 12/20/21 1137   temazepam (RESTORIL) capsule 30 mg  30 mg Oral QHS PRN Comer Locket, MD   30 mg at 12/19/21 2048   traZODone (DESYREL) tablet 100 mg  100 mg Oral QHS PRN Comer Locket, MD   100 mg at 12/19/21 2049    Lab Results:  Results for orders placed or performed during the hospital encounter of 12/07/21 (from the past 48  hour(s))  Glucose, capillary     Status: None   Collection Time: 12/19/21  5:10 AM  Result Value Ref Range   Glucose-Capillary 98 70 - 99 mg/dL    Comment: Glucose reference range applies only to samples taken after fasting for at least 8 hours.  Glucose, capillary     Status: Abnormal   Collection Time: 12/20/21  6:07 AM  Result Value Ref Range   Glucose-Capillary 110 (H) 70 - 99 mg/dL    Comment: Glucose reference range applies only to samples taken after fasting for at least 8 hours.    Blood Alcohol level:  Lab Results  Component Value Date   ETH <10 11/28/2021   ETH <10 11/23/2021    Metabolic Disorder Labs: Lab Results  Component Value Date   HGBA1C 6.9 (H) 11/17/2021   MPG 151.33 11/17/2021   MPG 111.15 02/03/2020   Lab Results  Component Value Date   PROLACTIN 8.1 06/01/2019   Lab Results  Component Value Date   CHOL 130 12/01/2021   TRIG 172 (H) 12/01/2021   HDL 28 (L) 12/01/2021   CHOLHDL 4.6 12/01/2021   VLDL 34 12/01/2021   LDLCALC 68 12/01/2021   LDLCALC 97 02/03/2020    Physical Findings:  Musculoskeletal: Strength & Muscle Tone: within normal limits Gait & Station: normal Patient leans: N/A  Psychiatric Specialty Exam:  Presentation  General Appearance: Appropriate for Environment; Casual   Eye Contact:Good   Speech:Clear and Coherent; Normal Rate   Speech Volume:Normal   Handedness:Right    Mood and Affect  Mood:Euthymic   Affect:Blunt    Thought Process  Thought Processes:Disorganized   Descriptions of Associations:Tangential   Orientation:Full (Time, Place and Person)   Thought Content:Scattered; Tangential; Illogical   History of Schizophrenia/Schizoaffective  disorder:Yes   Duration of Psychotic Symptoms:Greater than six months   Hallucinations:No data recorded Reports AH.  Denies VH.   Ideas of Reference:None Has delusional thought content.  Suicidal Thoughts:No data  recorded Denies   Homicidal Thoughts:No data recorded Denies    Sensorium  Memory:Immediate Fair; Recent Fair   Judgment:Impaired   Insight:Poor; Shallow    Executive Functions  Concentration: Poor   Attention Span: Poor   Recall:Fair   Fund of Knowledge:Fair   Language:Fair    Psychomotor Activity  Psychomotor Activity:No data recorded     Assets  Assets:Desire for Improvement; Resilience; Social Support    Sleep  Sleep:Sleep: Better      Physical Exam: Physical Exam Vitals reviewed.  Constitutional:      General: He is not in acute distress.    Appearance: He is normal weight. He is not toxic-appearing.  HENT:     Head: Normocephalic and atraumatic.     Mouth/Throat:     Mouth: Mucous membranes are moist.     Pharynx: Oropharynx is clear.  Pulmonary:     Effort: Pulmonary effort is normal. No respiratory distress.  Skin:    General: Skin is warm and dry.  Neurological:     Mental Status: He is alert.     Motor: No weakness.     Gait: Gait normal.  Psychiatric:        Mood and Affect: Mood normal.    Review of Systems  Constitutional:  Negative for chills and fever.  Cardiovascular:  Negative for chest pain and palpitations.  Gastrointestinal: Negative.   Genitourinary: Negative.   Musculoskeletal: Negative.   Neurological:  Negative for dizziness, tingling, tremors and headaches.  Psychiatric/Behavioral:  Negative for depression, hallucinations, memory loss, substance abuse and suicidal ideas. The patient is not nervous/anxious and does not have insomnia.    Blood pressure (!) 134/91, pulse 93, temperature 97.8 F (36.6 C), temperature source Oral, resp. rate 16, height 6\' 1"  (1.854 m), weight 117.9 kg, SpO2 98 %. Body mass index is 34.3 kg/m.   Treatment Plan Summary: ASSESSMENT: Principal Problem:   Schizoaffective disorder, bipolar type ([place]) Active Problems:   Tobacco use disorder   Patient appears to have  minimal improvements on current regimen, which leads [place] to believe that although he picked up his medications on time, he has not been compliant with taking them. ACT team confirmed that patient has not had LAI per the records since 6/1.  Concerns that since beginning Abilify, patient has not been improving; tapered off Abilify, and workup for Clozaril complete, so will switch to Clozaril especially now that patient has failed multiple antipsychotics.   PLAN: Safety and Monitoring:             -- Involuntary admission to inpatient psychiatric unit for safety, stabilization and treatment             -- Daily contact with patient to assess and evaluate symptoms and progress in treatment             -- Patient's case to be discussed in multi-disciplinary team meeting             -- Observation Level : q15 minute checks             -- Vital signs:  q12 hours             -- Precautions: suicide, elopement, and assault   2. Psychiatric Diagnoses and Treatment:  # Schizoaffective disorder-bipolar type - Continue  Zyprexa 10 mg every morning and 20 mg nightly.  When Clozaril level is increased, we will eventually cross taper off of Zyprexa likely onto a FGA.  - Patient currently on Abilify Maintena 400 mg LAI, last given 8/16 - Previously discontinued oral abilify on 12-19-21.  - Increase clozapine to 25 mg qhs  - Continue Depakote ER 1000 mg qhs for mood stabilization    PRN: Agitation protocol with Zyprexa 10 mg, Geodon 20 mg, and Ativan 1 mg   -- The risks/benefits/side-effects/alternatives to this medication were discussed in detail with the patient and time was given for questions. The patient consents to medication trial.              -- Metabolic profile and EKG monitoring obtained while on an atypical antipsychotic  BMI: 34.3 Lipid Panel: TG 172 HbgA1c: 6.9% QTc: 413             -- Encouraged patient to participate in unit milieu and in scheduled group therapies      3. Medical Issues  Being Addressed: #Nicotine use disorder - Nicotine patch 21 mg daily   #T2DM A.m. CBG 98. Home medication Synjardi 01/999 mg BID.  Due to the fact that patient is actively psychotic, only completing a.m. sleep e.g. monitoring.  Patient on home regimen and diabetes well-controlled (A1c 6.9%), so minimal concern for decompensation. -Metformin 1000 mg twice daily with meals - Jardiance 10 mg every morning - A.m. CBG monitoring only. SSI was implemented and discontinued.   #Hyperlipidemia - Continue home atorvastatin 40 mg daily  4. Discharge Planning:               -- Social work and case management to assist with discharge planning and identification of hospital follow-up needs prior to discharge             -- Estimated LOS: 10-14 additional days while titrating Clozaril             -- Discharge Concerns: Need to establish a safety plan; Medication compliance and effectiveness             -- Discharge Goals: Return home with outpatient referrals for mental health follow-up including medication management/psychotherapy   Christoper Allegra, MD 12/20/2021, 4:42 PM   Total Time Spent in Direct Patient Care:  I personally spent 35 minutes on the unit in direct patient care. The direct patient care time included face-to-face time with the patient, reviewing the patient's chart, communicating with other professionals, and coordinating care. Greater than 50% of this time was spent in counseling or coordinating care with the patient regarding goals of hospitalization, psycho-education, and discharge planning needs.   Janine Limbo, MD Psychiatrist

## 2021-12-20 NOTE — Progress Notes (Signed)
Patient did not attend morning orientation/goal setting group 

## 2021-12-21 DIAGNOSIS — F250 Schizoaffective disorder, bipolar type: Secondary | ICD-10-CM | POA: Diagnosis not present

## 2021-12-21 DIAGNOSIS — E119 Type 2 diabetes mellitus without complications: Secondary | ICD-10-CM | POA: Diagnosis not present

## 2021-12-21 DIAGNOSIS — F17200 Nicotine dependence, unspecified, uncomplicated: Secondary | ICD-10-CM | POA: Diagnosis not present

## 2021-12-21 LAB — GLUCOSE, CAPILLARY: Glucose-Capillary: 101 mg/dL — ABNORMAL HIGH (ref 70–99)

## 2021-12-21 MED ORDER — DOCUSATE SODIUM 100 MG PO CAPS
100.0000 mg | ORAL_CAPSULE | Freq: Every day | ORAL | Status: DC
  Administered 2021-12-22: 100 mg via ORAL
  Filled 2021-12-21 (×3): qty 1

## 2021-12-21 MED ORDER — OLANZAPINE 7.5 MG PO TABS
15.0000 mg | ORAL_TABLET | Freq: Every day | ORAL | Status: DC
  Administered 2021-12-21 – 2021-12-23 (×3): 15 mg via ORAL
  Filled 2021-12-21 (×5): qty 2

## 2021-12-21 MED ORDER — DIVALPROEX SODIUM 500 MG PO DR TAB
500.0000 mg | DELAYED_RELEASE_TABLET | Freq: Two times a day (BID) | ORAL | Status: DC
  Administered 2021-12-21 – 2022-01-05 (×30): 500 mg via ORAL
  Filled 2021-12-21 (×38): qty 1

## 2021-12-21 MED ORDER — OLANZAPINE 5 MG PO TABS
5.0000 mg | ORAL_TABLET | Freq: Two times a day (BID) | ORAL | Status: DC
  Administered 2021-12-22 – 2021-12-24 (×5): 5 mg via ORAL
  Filled 2021-12-21 (×9): qty 1

## 2021-12-21 MED ORDER — WHITE PETROLATUM EX OINT
TOPICAL_OINTMENT | CUTANEOUS | Status: AC
  Administered 2021-12-21: 1
  Filled 2021-12-21: qty 10

## 2021-12-21 MED ORDER — WHITE PETROLATUM EX OINT
TOPICAL_OINTMENT | CUTANEOUS | Status: AC
  Administered 2021-12-21: 1
  Filled 2021-12-21: qty 5

## 2021-12-21 MED ORDER — POLYETHYLENE GLYCOL 3350 17 G PO PACK
17.0000 g | PACK | Freq: Every day | ORAL | Status: DC | PRN
Start: 2021-12-21 — End: 2021-12-26

## 2021-12-21 NOTE — [facility] Group Notes (Signed)
[facility] Group Notes:  (Nursing/MHT/Case Management/Adjunct)  Date:  12/21/2021  Time:  9:50 AM  Type of Therapy:   Orientation/Goals group  Participation Level:  Active  Participation Quality:  Appropriate and Attentive  Affect:  Not Congruent  Cognitive:  Delusional, Hallucinating, and Lacking  Insight:  Lacking and Limited  Engagement in Group:  Engaged, Improving, and Off Topic  Modes of Intervention:  Discussion, Education, Orientation, and Support  Summary of Progress/Problems: Pt goal for today is to be able to go home.   Abel Hageman J Paityn Balsam 12/21/2021, 9:50 AM

## 2021-12-21 NOTE — Group Note (Signed)
LCSW Group Therapy Note  Group Date: 12/21/2021 Start Time: 1300 End Time: 1400   Type of Therapy and Topic:  Group Therapy - How To Cope with Nervousness about Discharge   Participation Level:  Active   Description of Group This process group involved identification of patients' feelings about discharge. Some of them are scheduled to be discharged soon, while others are new admissions, but each of them was asked to share thoughts and feelings surrounding discharge from the hospital. One common theme was that they are excited at the prospect of going home, while another was that many of them are apprehensive about sharing why they were hospitalized. Patients were given the opportunity to discuss these feelings with their peers in preparation for discharge.  Therapeutic Goals  Patient will identify their overall feelings about pending discharge. Patient will think about how they might proactively address issues that they believe will once again arise once they get home (i.e. with parents). Patients will participate in discussion about having hope for change.   Summary of Patient Progress:  Warrick was very active throughout the session. Dionicio demonstrated poor insight into the subject matter, and proved open to input from peers and feedback from CSW. [place] was respectful of peers and participated throughout the entire session.  Patient was disorganized at times but was able to be redirected.  Pt. Discussed different supports in his life that he plans on connecting with when he is discharged.    Therapeutic Modalities Cognitive Behavioral Therapy   Beatris Si, LCSW 12/21/2021  12:09 PM

## 2021-12-21 NOTE — Progress Notes (Signed)
   12/21/21 1000  Psych Admission Type (Psych Patients Only)  Admission Status Involuntary  Psychosocial Assessment  Patient Complaints Anxiety  Eye Contact Fair  Facial Expression Animated;Anxious  Affect Anxious;Appropriate to circumstance  Speech Pressured;Rapid;Tangential  Interaction Assertive;Attention-seeking;Needy  Motor Activity Pacing  Appearance/Hygiene Unremarkable  Behavior Characteristics Cooperative  Mood Labile;Suspicious;Preoccupied  Aggressive Behavior  Effect No apparent injury  Thought Process  Coherency Disorganized  Content Delusions  Delusions Grandeur;Persecutory  Perception Hallucinations  Hallucination Auditory;Visual  Judgment Limited  Confusion None  Danger to Self  Current suicidal ideation? Denies  Danger to Others  Danger to Others None reported or observed

## 2021-12-21 NOTE — Progress Notes (Signed)
    12/21/21 2100  Psych Admission Type (Psych Patients Only)  Admission Status Involuntary  Psychosocial Assessment  Patient Complaints Anxiety  Eye Contact Fair  Facial Expression Animated;Anxious  Affect Anxious  Speech Pressured;Rapid;Tangential  Interaction Assertive;Needy  Motor Activity Pacing  Appearance/Hygiene Unremarkable  Behavior Characteristics Intrusive  Mood Suspicious;Preoccupied;Anxious  Thought Process  Coherency Disorganized  Content Delusions  Delusions Grandeur;Paranoid;Persecutory  Perception Hallucinations  Hallucination Auditory;Visual  Judgment Limited  Confusion None  Danger to Self  Current suicidal ideation? Denies  Danger to Others  Danger to Others None reported or observed

## 2021-12-21 NOTE — Progress Notes (Signed)
   12/21/21 0500  Sleep  Number of Hours 6.5

## 2021-12-21 NOTE — Progress Notes (Signed)
[facility] MD Progress Note  12/21/2021 2:09 PM [name]  MRN:  [id number] Subjective:  [name] is a 46 year old male with a reported past psychiatric history of schizophrenia-paranoid, bipolar disorder housing insecurity who presented to [facility] ED on 8/3 under IVC for psychosis and bizarre behaviors.    Yesterday's Recommendations per Psychiatry Team: - Continue Zyprexa 10 mg every morning and 20 mg nightly.  When Clozaril level is increased, we will eventually cross taper off of Zyprexa likely onto a FGA.  - Patient currently on Abilify Maintena 400 mg LAI, last given 8/16 - Previously discontinued oral abilify on 12-19-21.  - Increase clozapine to 25 mg qhs  - Continue Depakote ER 1000 mg qhs for mood stabilization    On evaluation today, the patient continues to be very psychotic.  He is disorganized and responding to internal stimuli.  He continues to make bizarre statements.  He is tangential.  He does not participate logically or rationally within the context of the interview, is asked direct concrete questions.  Reports mood is stable.  Anxiety is up and down.  Reports sleep is okay.  Appetite is good.  Denies SI and HI.  Reports AH "evil spirits if you go into my room.  Everyone can hear them."  Denies VH.  Denies side effects to current medications.  Reports having bowel movement last night and this morning.    Baseline review of symptoms, specific for clozapine: Malaise/Sedation: Denies Chest pain: Denies Shortness of breath: Denies Exertional capacity: Denies Tachycardia: Denies Cough: Denies Sore Throat: Denies Fever: Denies Orthostatic hypotension (dizziness with standing): Denies Hypersalivation: Denies Constipation: Reports last bowel movement was this morning Symptoms of GERD: Denies Nausea: Denies Nocturnal enuresis: Denies   Principal Problem: Schizoaffective disorder, bipolar type (HCC) Diagnosis: Principal Problem:   Schizoaffective disorder, bipolar  type (HCC) Active Problems:   Tobacco use disorder   Diabetes (HCC)   Hyperlipidemia   Total Time spent with patient: 20 minutes  Past Psychiatric History: Previous Psych Diagnoses: Schizophrenia, Schizoaffective disorder-bipolar type Prior inpatient treatment: Multiple, [facility] 04/2018 Prior outpatient treatment: [facility] History of suicide: History of homicide: Denies Psychiatric medication history: Previously on 6/14 Zyprexa 5 mg qAM and 30 mg qHS; Prolixin 10 mg in 05/2020, Invega Sustenna, PO Invega, and Haldol (02/2020), Neurontin.  Currently taking Abilify Maintena 400 mg and Zyprexa 10 mg BID Psychiatric medication compliance history: Reports compliance, and picking up medications appropriately, per pharmacy records Neuromodulation history: Denies Current Psychiatrist:ACTT services via [name]  Past Medical History:  Past Medical History:  Diagnosis Date   Bipolar disorder (HCC)    Bronchitis    Hyperlipidemia    Paranoid schizophrenia (HCC)    Schizophrenia (HCC)     Past Surgical History:  Procedure Laterality Date   TESTICLE IMPLANTATION TO THIGH     TESTICLE SURGERY     Family History:  Family History  Problem Relation Age of Onset   Schizophrenia Neg Hx    Family Psychiatric  History: See H&P Social History:  Social History   Substance and Sexual Activity  Alcohol Use Not Currently   Comment: weekly     Social History   Substance and Sexual Activity  Drug Use Yes   Types: Marijuana   Comment: smokes hemp    Social History   Socioeconomic History   Marital status: Single    Spouse name: Not on file   Number of children: Not on file   Years of education: 12 years  Highest education level: 12th grade  Occupational History   Occupation: On disability  Tobacco Use   Smoking status: Every Day    Packs/day: 1.00    Types: Cigarettes   Smokeless tobacco: Never  Vaping Use   Vaping Use: Never used  Substance and Sexual Activity   Alcohol  use: Not Currently    Comment: weekly   Drug use: Yes    Types: Marijuana    Comment: smokes hemp   Sexual activity: Yes    Birth control/protection: Condom  Other Topics Concern   Not on file  Social History Narrative   Pt lives alone in apartment complex for mentally ill   Social Determinants of Health   Financial Resource Strain: Not on file  Food Insecurity: Not on file  Transportation Needs: Not on file  Physical Activity: Not on file  Stress: Not on file  Social Connections: Not on file   Additional Social History:       Sleep: Fair  Appetite:  Good  Current Medications: Current Facility-Administered Medications  Medication Dose Route Frequency Provider Last Rate Last Admin   alum & mag hydroxide-simeth (MAALOX/MYLANTA) 200-200-20 MG/5ML suspension 30 mL  30 mL Oral Q4H PRN Bobbitt, Shalon E, NP   30 mL at 12/20/21 1135   atorvastatin (LIPITOR) tablet 40 mg  40 mg Oral QHS Bobbitt, Shalon E, NP   40 mg at 12/20/21 2035   benztropine (COGENTIN) tablet 1 mg  1 mg Oral BID PRN Harlow Asa, MD   1 mg at 12/19/21 [id number]   Or   benztropine mesylate (COGENTIN) injection 1 mg  1 mg Intramuscular BID PRN Harlow Asa, MD       bismuth subsalicylate (PEPTO BISMOL) chewable tablet 524 mg  524 mg Oral Q4H PRN Minda Ditto, RPH   524 mg at 12/21/21 [id number]   cloZAPine (CLOZARIL) tablet 37.5 mg  37.5 mg Oral QHS Rosezetta Schlatter, MD       Followed by   Derrill Memo ON 12/22/2021] cloZAPine (CLOZARIL) tablet 50 mg  50 mg Oral QHS Rosezetta Schlatter, MD       Followed by   Derrill Memo ON 12/23/2021] cloZAPine (CLOZARIL) tablet 62.5 mg  62.5 mg Oral QHS Rosezetta Schlatter, MD       Followed by   Derrill Memo ON 12/24/2021] cloZAPine (CLOZARIL) tablet 75 mg  75 mg Oral QHS Rosezetta Schlatter, MD       Followed by   Derrill Memo ON 12/25/2021] cloZAPine (CLOZARIL) tablet 87.5 mg  87.5 mg Oral QHS Rosezetta Schlatter, MD       Followed by   Derrill Memo ON 12/26/2021] cloZAPine (CLOZARIL) tablet 100 mg  100 mg Oral QHS  Rosezetta Schlatter, MD       divalproex (DEPAKOTE) DR tablet 500 mg  500 mg Oral Q12H Katherene Dinino, Ovid Curd, MD       Derrill Memo ON 12/22/2021] docusate sodium (COLACE) capsule 100 mg  100 mg Oral Daily Suvan Stcyr, MD       empagliflozin (JARDIANCE) tablet 10 mg  10 mg Oral Daily Rosezetta Schlatter, MD   10 mg at 12/21/21 0734   hydrOXYzine (ATARAX) tablet 25 mg  25 mg Oral TID PRN Bobbitt, Shalon E, NP   25 mg at 12/20/21 1135   ibuprofen (ADVIL) tablet 800 mg  800 mg Oral BID PRN Bobbitt, Shalon E, NP   800 mg at 12/21/21 0733   magnesium hydroxide (MILK OF MAGNESIA) suspension 30 mL  30 mL Oral Daily PRN Bobbitt, Shalon E,  NP   30 mL at 12/10/21 2029   melatonin tablet 5 mg  5 mg Oral QHS PRN Phineas Inches, MD   5 mg at 12/20/21 2035   metFORMIN (GLUCOPHAGE) tablet 1,000 mg  1,000 mg Oral BID WC Bobbitt, Shalon E, NP   1,000 mg at 12/21/21 3428   Muscle Rub CREA   Topical PRN Lamar Sprinkles, MD       nicotine (NICODERM CQ - dosed in mg/24 hours) patch 21 mg  21 mg Transdermal Daily Lamar Sprinkles, MD   21 mg at 12/21/21 0738   OLANZapine (ZYPREXA) injection 5 mg  5 mg Intramuscular Q8H PRN Alyvia Derk, Harrold Donath, MD       OLANZapine (ZYPREXA) tablet 15 mg  15 mg Oral QHS Lakeita Panther, Harrold Donath, MD       Melene Muller ON 12/22/2021] OLANZapine (ZYPREXA) tablet 5 mg  5 mg Oral BID Veleria Barnhardt, Harrold Donath, MD       OLANZapine zydis (ZYPREXA) disintegrating tablet 5 mg  5 mg Oral Q8H PRN Wladyslaw Henrichs, Harrold Donath, MD   5 mg at 12/20/21 1136   polyethylene glycol (MIRALAX / GLYCOLAX) packet 17 g  17 g Oral Daily PRN Calyb Mcquarrie, Harrold Donath, MD       sodium chloride (OCEAN) 0.65 % nasal spray 1 spray  1 spray Each Nare PRN Lamar Sprinkles, MD   1 spray at 12/20/21 1137   temazepam (RESTORIL) capsule 30 mg  30 mg Oral QHS PRN Comer Locket, MD   30 mg at 12/20/21 2035   traZODone (DESYREL) tablet 100 mg  100 mg Oral QHS PRN Comer Locket, MD   100 mg at 12/20/21 2035    Lab Results:  Results for orders placed or performed  during the hospital encounter of 12/07/21 (from the past 48 hour(s))  Glucose, capillary     Status: Abnormal   Collection Time: 12/20/21  6:07 AM  Result Value Ref Range   Glucose-Capillary 110 (H) 70 - 99 mg/dL    Comment: Glucose reference range applies only to samples taken after fasting for at least 8 hours.  Glucose, capillary     Status: Abnormal   Collection Time: 12/21/21  5:36 AM  Result Value Ref Range   Glucose-Capillary 101 (H) 70 - 99 mg/dL    Comment: Glucose reference range applies only to samples taken after fasting for at least 8 hours.    Blood Alcohol level:  Lab Results  Component Value Date   ETH <10 11/28/2021   ETH <10 11/23/2021    Metabolic Disorder Labs: Lab Results  Component Value Date   HGBA1C 6.9 (H) 11/17/2021   MPG 151.33 11/17/2021   MPG 111.15 02/03/2020   Lab Results  Component Value Date   PROLACTIN 8.1 06/01/2019   Lab Results  Component Value Date   CHOL 130 12/01/2021   TRIG 172 (H) 12/01/2021   HDL 28 (L) 12/01/2021   CHOLHDL 4.6 12/01/2021   VLDL 34 12/01/2021   LDLCALC 68 12/01/2021   LDLCALC 97 02/03/2020    Physical Findings:  Musculoskeletal: Strength & Muscle Tone: within normal limits Gait & Station: normal Patient leans: N/A  Psychiatric Specialty Exam:  Presentation  General Appearance: Appropriate for Environment; Casual   Eye Contact:Good   Speech:Clear and Coherent; Normal Rate   Speech Volume:Normal   Handedness:Right    Mood and Affect  Mood:Euthymic   Affect:Blunt    Thought Process  Thought Processes:Disorganized   Descriptions of Associations:Tangential   Orientation:Full (Time, Place and Person)  Thought Content:Scattered; Tangential; Illogical   History of Schizophrenia/Schizoaffective disorder:Yes   Duration of Psychotic Symptoms:Greater than six months   Hallucinations:No data recorded Reports AH.  Denies VH.   Ideas of Reference:None Has delusional  thought content.  Suicidal Thoughts:No data recorded Denies   Homicidal Thoughts:No data recorded Denies    Sensorium  Memory:Immediate Fair; Recent Fair   Judgment:Impaired   Insight:Poor; Shallow    Executive Functions  Concentration: Poor   Attention Span: Poor   Recall:Fair   Fund of Knowledge:Fair   Language:Fair    Psychomotor Activity  Psychomotor Activity:No data recorded     Assets  Assets:Desire for Improvement; Resilience; Social Support    Sleep  Sleep:Sleep: Better      Physical Exam: Physical Exam Vitals reviewed.  Constitutional:      General: He is not in acute distress.    Appearance: He is normal weight. He is not toxic-appearing.  HENT:     Head: Normocephalic and atraumatic.     Mouth/Throat:     Mouth: Mucous membranes are moist.     Pharynx: Oropharynx is clear.  Pulmonary:     Effort: Pulmonary effort is normal. No respiratory distress.  Skin:    General: Skin is warm and dry.  Neurological:     Mental Status: He is alert.     Motor: No weakness.     Gait: Gait normal.  Psychiatric:        Mood and Affect: Mood normal.    Review of Systems  Constitutional:  Negative for chills and fever.  Cardiovascular:  Negative for chest pain and palpitations.  Gastrointestinal: Negative.   Genitourinary: Negative.   Musculoskeletal: Negative.   Neurological:  Negative for dizziness, tingling, tremors and headaches.  Psychiatric/Behavioral:  Negative for depression, hallucinations, memory loss, substance abuse and suicidal ideas. The patient is not nervous/anxious and does not have insomnia.    Blood pressure 110/87, pulse (!) 113, temperature 98 F (36.7 C), temperature source Oral, resp. rate 16, height 6\' 1"  (1.854 m), weight 117.9 kg, SpO2 99 %. Body mass index is 34.3 kg/m.   Treatment Plan Summary: ASSESSMENT: Principal Problem:   Schizoaffective disorder, bipolar type (HCC) Active Problems:   Tobacco  use disorder   Patient appears to have minimal improvements on current regimen, which leads to believe that although he picked up his medications on time, he has not been compliant with taking them. ACT team confirmed that patient has not had LAI per the records since 6/1.  Concerns that since beginning Abilify, patient has not been improving; tapered off Abilify, and workup for Clozaril complete, so will switch to Clozaril as that patient has failed multiple antipsychotics.   PLAN: Safety and Monitoring:             -- Involuntary admission to inpatient psychiatric unit for safety, stabilization and treatment             -- Daily contact with patient to assess and evaluate symptoms and progress in treatment             -- Patient's case to be discussed in multi-disciplinary team meeting             -- Observation Level : q15 minute checks             -- Vital signs:  q12 hours             -- Precautions: suicide, elopement, and assault   2. Psychiatric Diagnoses and  Treatment:  # Schizoaffective disorder-bipolar type -Change Zyprexa from 10 mg qam/20 mg qhs -> to 5 mg at 600, 5 mg at 1400, and 15 mg qhs. This is a decrease in dose ase we cross titrate to clozapine, and should also provide coverage for afternoon agitation. Will likely eventually cross taper to FGA.  - Patient currently on Abilify Maintena 400 mg LAI, last given 8/16 - Previously discontinued oral abilify on 12-19-21.  - Increase clozapine to 37.5 mg qhs for schizophrenia -Change Depakote to DR 500 mg bid - for mood stabilization     -- The risks/benefits/side-effects/alternatives to this medication were discussed in detail with the patient and time was given for questions. The patient consents to medication trial.              -- Metabolic profile and EKG monitoring obtained while on an atypical antipsychotic  BMI: 34.3 Lipid Panel: TG 172 HbgA1c: 6.9% QTc: 413             -- Encouraged patient to participate in unit  milieu and in scheduled group therapies      3. Medical Issues Being Addressed: #Nicotine use disorder - Nicotine patch 21 mg daily   #T2DM A.m. CBG 98. Home medication Synjardi 01/999 mg BID.  Due to the fact that patient is actively psychotic, only completing a.m. sleep e.g. monitoring.  Patient on home regimen and diabetes well-controlled (A1c 6.9%), so minimal concern for decompensation. -Metformin 1000 mg twice daily with meals - Jardiance 10 mg every morning - DISCONTINUE am CBG monitoring only. SSI was implemented and discontinued.   #Hyperlipidemia - Continue home atorvastatin 40 mg daily  4. Discharge Planning:               -- Social work and case management to assist with discharge planning and identification of hospital follow-up needs prior to discharge             -- Estimated LOS: 10-14 additional days while titrating Clozaril             -- Discharge Concerns: Need to establish a safety plan; Medication compliance and effectiveness             -- Discharge Goals: Return home with outpatient referrals for mental health follow-up including medication management/psychotherapy   Christoper Allegra, MD 12/21/2021, 2:09 PM   Total Time Spent in Direct Patient Care:  I personally spent 35 minutes on the unit in direct patient care. The direct patient care time included face-to-face time with the patient, reviewing the patient's chart, communicating with other professionals, and coordinating care. Greater than 50% of this time was spent in counseling or coordinating care with the patient regarding goals of hospitalization, psycho-education, and discharge planning needs.   Janine Limbo, MD Psychiatrist

## 2021-12-21 NOTE — Group Note (Signed)
Recreation Therapy Group Note   Group Topic:Goal Setting  Group Date: 12/21/2021 Start Time: 1000 End Time: 1045 Facilitators: Caroll Rancher, LRT,CTRS Location: [address] Dayroom   Goal Area(s) Addresses:  Patient will listen on 1 prompt. Patient will participate in discussion of what a goal is. Patient will successfully complete their self inventory sheet.  Group Description: LRT and patients discussed the importance of goals and how they impact one's life.  Patients were then given a worksheet where they were to identify goals they wanted to accomplish in the next week, month, year and five years.  Patients would then identify obstacles reaching their goals, what they need to achieve goals and what they can start doing now to work towards those goals.   Affect/Mood: Appropriate   Participation Level: Engaged   Participation Quality: Independent   Behavior: Attentive    Speech/Thought Process: Delusional   Insight: Lacking   Judgement: Lacking    Modes of Intervention: Worksheet   Patient Response to Interventions:  Engaged   Education Outcome:  Acknowledges education and In group clarification offered    Clinical Observations/Individualized Feedback: Pt was engaged but was delusional with some of his comments.  Pt was focused on being put in hospitals by his mother when "I didn't do anything".  Pt did identify wanting to go home in the next week.  In a month, wants to stay safe and careful.  In a year, stay out of the hospital and in five years stay out of the hospital "forever because I have the perfect medications now".  Pt identified obstacles as "getting in safe, watching movies, tv and playing video games.  Pt expressed needing to "jump higher than ever before" to reach goals.  Pt identified being able today by walking out his soreness in his legs.    Plan: Continue to engage patient in RT group sessions 2-3x/week.   Caroll Rancher, LRT,CTRS 12/21/2021 1:38  PM

## 2021-12-21 NOTE — Progress Notes (Signed)
Patient became agitated, rasing his voice saying he doesn't belong on this hall he belongs on the other side. Patient then made a phone call to a family member saying "they mistreating me here, come get me." Patient then became upset with family member and slammed the phone down. Patient also saying that his father has access to the cameras here and is watching him from home. Patient was given prn Zyprexa for agitation.

## 2021-12-21 NOTE — [facility] Group Notes (Signed)
  Spirituality group facilitated by Kathleen Argue, [facility].   Group Description: Group focused on topic of hope. Patients participated in facilitated discussion around topic, connecting with one another around experiences and definitions for hope. Group members engaged with visual explorer photos, reflecting on what hope looks like for them today. Group engaged in discussion around how their definitions of hope are present today in hospital.   Modalities: Psycho-social ed, Adlerian, Narrative, MI   Patient Progress: [name] attended group and participated in the group conversation. He stated that he was very tired, but showed engagement in the group.  [address] [facility] Pager, [phone]

## 2021-12-21 NOTE — [facility] Group Notes (Signed)
Adult Psychoeducational Group Note  Date:  12/21/2021 Time:  8:27 PM  Group Topic/Focus:  Wrap-Up Group:   The focus of this group is to help patients review their daily goal of treatment and discuss progress on daily workbooks.  Participation Level:  Active  Participation Quality:  Inattentive  Affect:  Blunted  Cognitive:  Disorganized  Insight: Lacking  Engagement in Group:  Distracting, Off Topic, and Poor  Modes of Intervention:  Discussion, Education, and Reality Testing  Additional Comments:  Pt attended and participated in wrap up group this evening and rated their day a 10/10. Pt stated that they spoke to their mother and told them that they would be changing their name but unsure of what they would change it to. Pt then began to rant about unrelated topics, in which writer had to end discussion with patient. Pt left group soon after.   Chrisandra Netters 12/21/2021, 8:27 PM

## 2021-12-22 DIAGNOSIS — F29 Unspecified psychosis not due to a substance or known physiological condition: Secondary | ICD-10-CM | POA: Diagnosis not present

## 2021-12-22 LAB — GLUCOSE, CAPILLARY: Glucose-Capillary: 97 mg/dL (ref 70–99)

## 2021-12-22 MED ORDER — WHITE PETROLATUM EX OINT
TOPICAL_OINTMENT | CUTANEOUS | Status: AC
  Administered 2021-12-22: 1
  Filled 2021-12-22: qty 10

## 2021-12-22 MED ORDER — WHITE PETROLATUM EX OINT
TOPICAL_OINTMENT | CUTANEOUS | Status: AC
  Filled 2021-12-22: qty 5

## 2021-12-22 NOTE — [facility] Group Notes (Signed)
Goals Group 12/22/2021   Group Focus: affirmation, clarity of thought, and goals/reality orientation Treatment Modality:  Psychoeducation Interventions utilized were assignment, group exercise, and support Purpose: To be able to understand and verbalize the reason for their admission to the hospital. To understand that the medication helps with their chemical imbalance but they also need to work on their choices in life. To be challenged to develop a list of 30 positives about themselves. Also introduce the concept that "feelings" are not reality.  Participation Level:  Active  Participation Quality:  Appropriate  Affect:  Appropriate  Cognitive:  inappropriate  Insight:  lacking  Engagement in Group:  Engaged  Additional Comments: Did a list of 20 things but not always on the subject matter, which was his positive traits. Remains religiously preoccupied.  Dione Housekeeper

## 2021-12-22 NOTE — Group Note (Signed)
[facility] LCSW Group Therapy Note   Type of Therapy and Topic:  Group Therapy:  Healthy vs Unhealthy Coping Skills  Participation Level:  Active   Description of Group:  The focus of this group was to determine what unhealthy and healthy coping techniques typically are used by group members and why they tend to fall back on those particular techniques of handling hard situations. Patients were guided in becoming aware of the differences between healthy and unhealthy coping techniques.  Facilitator led a discussion about the benefits and costs of returning to old unhealthy coping techniques, as well as the benefits and costs of learning new healthier coping skills.  Therapeutic Goals Patients learned that coping is what human beings do all day long to deal with various situations in their lives Patients defined and discussed healthy vs unhealthy coping techniques Patients came to understand the the reasons human beings often return to old coping techniques that they know are unhelpful Patients were provided with motivation to consider new means of coping that may be more healthy and helpful Patients provided support and ideas to each other  Summary of Patient Progress: During group, patient expressed that one coping skill typically used if upset is talking about it or if that hurts, substituting something else.  The patient had difficulty staying on topic, had to be redirected consistently throughout group.   Therapeutic Modalities Psychoeducation Motivational Interviewing   Ambrose Mantle, LCSW 12/22/2021, 11:44 AM

## 2021-12-22 NOTE — Progress Notes (Signed)
[name] has been pacing the unit much of the evening.  He did attend evening wrap up group.  He remains delusional, disorganized and grandiose.  He is preoccupied with putting Vaseline on his head believing that it will help grow his hair back.  He denied SI/HI or VH.  He reports continued auditory hallucinations.  He stated he likes the Zyprexa because "it gives me energy."  Restoril and trazodone given for sleep.  He is currently resting with his eyes closed and appears to be asleep.  Q 15 minute checks maintained for safety.  He remains safe on the unit.   12/22/21 2047  Psych Admission Type (Psych Patients Only)  Admission Status Involuntary  Psychosocial Assessment  Patient Complaints Anxiety;Insomnia  Eye Contact Fair  Facial Expression Animated;Anxious  Affect Anxious  Speech Tangential  Interaction Attention-seeking;Needy  Motor Activity Pacing  Appearance/Hygiene Unremarkable  Behavior Characteristics Cooperative  Mood Labile  Thought Process  Coherency Disorganized;Tangential  Content Delusions  Delusions Grandeur;Paranoid  Perception Hallucinations  Hallucination Auditory  Judgment Limited  Confusion None  Danger to Self  Current suicidal ideation? Denies  Danger to Others  Danger to Others None reported or observed

## 2021-12-22 NOTE — Progress Notes (Signed)
Adult Psychoeducational Group Note  Date:  12/22/2021 Time:  8:56 PM  Group Topic/Focus:  Wrap-Up Group:   The focus of this group is to help patients review their daily goal of treatment and discuss progress on daily workbooks.  Participation Level:  Active  Participation Quality:  Appropriate  Affect:  Not Congruent  Cognitive:  Disorganized  Insight: Good  Engagement in Group:  Distracting and Off Topic  Modes of Intervention:  Discussion  Additional Comments:   Pt was extremely tangential during group discussion. Pt states that he had a good day and was able to go to a few groups today. Pt denies everything.   Vevelyn Pat 12/22/2021, 8:56 PM

## 2021-12-22 NOTE — Progress Notes (Addendum)
[facility] MD Progress Note  12/22/2021 10:04 AM [name]  MRN:  [id number] Subjective:  [name] is a 46 year old male with a reported past psychiatric history of schizophrenia-paranoid, bipolar disorder housing insecurity who presented to [facility] ED on 8/3 under IVC for psychosis and bizarre behaviors.     On evaluation today, the patient continues to be psychotic and tangential.  He is disorganized and responding to internal stimuli.  He continues to make bizarre statements.  He is tangential.  He does not participate logically or rationally within the context of the interview, is asked direct concrete questions.  Reports mood is stable.  Reports sleep is okay.  Appetite is good.  Denies SI and HI.  Reports AH of his brother who had died several months ago which led to him being suspicious that his brother had faked his death stating that "people with money can do that you know".  Patient also reports himself coming back from the dead either via UFO or "being mummified".  Patient then discusses how he knew he "should not have posted on Facebook the Resurrection technique to bring back the dead". Patient denies VH.  Denies side effects to current medications.  Reports having bowel movement this morning.    Baseline review of symptoms, specific for clozapine: Malaise/Sedation: Denies Chest pain: Denies Shortness of breath: Denies Exertional capacity: Denies Tachycardia: Denies Cough: Denies Sore Throat: Denies Fever: Denies Orthostatic hypotension (dizziness with standing): Denies Hypersalivation: Denies Constipation: Reports last bowel movement was this morning Symptoms of GERD: Denies Nausea: Denies Nocturnal enuresis: Denies   Principal Problem: Schizoaffective disorder, bipolar type ([place]) Diagnosis: Principal Problem:   Schizoaffective disorder, bipolar type ([place]) Active Problems:   Tobacco use disorder   Diabetes ([place])   Hyperlipidemia   Total Time spent with patient:  20 minutes  Past Psychiatric History: Previous Psych Diagnoses: Schizophrenia, Schizoaffective disorder-bipolar type Prior inpatient treatment: Multiple, [facility] 04/2018 Prior outpatient treatment: [facility] History of suicide: History of homicide: Denies Psychiatric medication history: Previously on 6/14 Zyprexa 5 mg qAM and 30 mg qHS; Prolixin 10 mg in 05/2020, Invega Sustenna, PO Invega, and Haldol (02/2020), Neurontin.  Currently taking Abilify Maintena 400 mg and Zyprexa 10 mg BID Psychiatric medication compliance history: Reports compliance, and picking up medications appropriately, per pharmacy records Neuromodulation history: Denies Current Psychiatrist:[facility] services via [name]  Past Medical History:  Past Medical History:  Diagnosis Date   Bipolar disorder ([place])    Bronchitis    Hyperlipidemia    Paranoid schizophrenia ([place])    Schizophrenia ([place])     Past Surgical History:  Procedure Laterality Date   TESTICLE IMPLANTATION TO THIGH     TESTICLE SURGERY     Family History:  Family History  Problem Relation Age of Onset   Schizophrenia Neg Hx    Family Psychiatric  History: See H&P Social History:  Social History   Substance and Sexual Activity  Alcohol Use Not Currently   Comment: weekly     Social History   Substance and Sexual Activity  Drug Use Yes   Types: Marijuana   Comment: smokes hemp    Social History   Socioeconomic History   Marital status: Single    Spouse name: Not on file   Number of children: Not on file   Years of education: 12 years   Highest education level: 12th grade  Occupational History   Occupation: On disability  Tobacco Use   Smoking status: Every Day  Packs/day: 1.00    Types: Cigarettes   Smokeless tobacco: Never  Vaping Use   Vaping Use: Never used  Substance and Sexual Activity   Alcohol use: Not Currently    Comment: weekly   Drug use: Yes    Types: Marijuana    Comment: smokes hemp   Sexual activity: Yes     Birth control/protection: Condom  Other Topics Concern   Not on file  Social History Narrative   Pt lives alone in apartment complex for mentally ill   Social Determinants of Health   Financial Resource Strain: Not on file  Food Insecurity: Not on file  Transportation Needs: Not on file  Physical Activity: Not on file  Stress: Not on file  Social Connections: Not on file   Additional Social History:       Sleep: Fair  Appetite:  Good  Current Medications: Current Facility-Administered Medications  Medication Dose Route Frequency Provider Last Rate Last Admin   alum & mag hydroxide-simeth (MAALOX/MYLANTA) 200-200-20 MG/5ML suspension 30 mL  30 mL Oral Q4H PRN Bobbitt, Shalon E, NP   30 mL at 12/20/21 1135   atorvastatin (LIPITOR) tablet 40 mg  40 mg Oral QHS Bobbitt, Shalon E, NP   40 mg at 12/21/21 2101   benztropine (COGENTIN) tablet 1 mg  1 mg Oral BID PRN Harlow Asa, MD   1 mg at 12/19/21 [id number]   Or   benztropine mesylate (COGENTIN) injection 1 mg  1 mg Intramuscular BID PRN Harlow Asa, MD       bismuth subsalicylate (PEPTO BISMOL) chewable tablet 524 mg  524 mg Oral Q4H PRN Minda Ditto, RPH   524 mg at 12/22/21 0739   cloZAPine (CLOZARIL) tablet 50 mg  50 mg Oral QHS Rosezetta Schlatter, MD       Followed by   Derrill Memo ON 12/23/2021] cloZAPine (CLOZARIL) tablet 62.5 mg  62.5 mg Oral QHS Rosezetta Schlatter, MD       Followed by   Derrill Memo ON 12/24/2021] cloZAPine (CLOZARIL) tablet 75 mg  75 mg Oral QHS Rosezetta Schlatter, MD       Followed by   Derrill Memo ON 12/25/2021] cloZAPine (CLOZARIL) tablet 87.5 mg  87.5 mg Oral QHS Rosezetta Schlatter, MD       Followed by   Derrill Memo ON 12/26/2021] cloZAPine (CLOZARIL) tablet 100 mg  100 mg Oral QHS Rosezetta Schlatter, MD       divalproex (DEPAKOTE) DR tablet 500 mg  500 mg Oral Q12H Massengill, Nathan, MD   500 mg at 12/22/21 0737   docusate sodium (COLACE) capsule 100 mg  100 mg Oral Daily Massengill, Ovid Curd, MD   100 mg at 12/22/21 0737    empagliflozin (JARDIANCE) tablet 10 mg  10 mg Oral Daily Rosezetta Schlatter, MD   10 mg at 12/22/21 0737   hydrOXYzine (ATARAX) tablet 25 mg  25 mg Oral TID PRN Bobbitt, Shalon E, NP   25 mg at 12/22/21 0737   ibuprofen (ADVIL) tablet 800 mg  800 mg Oral BID PRN Bobbitt, Shalon E, NP   800 mg at 12/22/21 0737   magnesium hydroxide (MILK OF MAGNESIA) suspension 30 mL  30 mL Oral Daily PRN Bobbitt, Shalon E, NP   30 mL at 12/10/21 2029   melatonin tablet 5 mg  5 mg Oral QHS PRN Massengill, Ovid Curd, MD   5 mg at 12/20/21 2035   metFORMIN (GLUCOPHAGE) tablet 1,000 mg  1,000 mg Oral BID WC Bobbitt, Lennie Muckle, NP  1,000 mg at 12/22/21 0746   Muscle Rub CREA   Topical PRN Rosezetta Schlatter, MD       nicotine (NICODERM CQ - dosed in mg/24 hours) patch 21 mg  21 mg Transdermal Daily Rosezetta Schlatter, MD   21 mg at 12/22/21 0746   OLANZapine (ZYPREXA) injection 5 mg  5 mg Intramuscular Q8H PRN Massengill, Ovid Curd, MD       OLANZapine (ZYPREXA) tablet 15 mg  15 mg Oral QHS Massengill, Ovid Curd, MD   15 mg at 12/21/21 2101   OLANZapine (ZYPREXA) tablet 5 mg  5 mg Oral BID Massengill, Ovid Curd, MD   5 mg at 12/22/21 0606   OLANZapine zydis (ZYPREXA) disintegrating tablet 5 mg  5 mg Oral Q8H PRN Massengill, Ovid Curd, MD   5 mg at 12/21/21 1448   polyethylene glycol (MIRALAX / GLYCOLAX) packet 17 g  17 g Oral Daily PRN Massengill, Ovid Curd, MD       sodium chloride (OCEAN) 0.65 % nasal spray 1 spray  1 spray Each Nare PRN Rosezetta Schlatter, MD   1 spray at 12/20/21 1137   temazepam (RESTORIL) capsule 30 mg  30 mg Oral QHS PRN Harlow Asa, MD   30 mg at 12/21/21 2102   traZODone (DESYREL) tablet 100 mg  100 mg Oral QHS PRN Harlow Asa, MD   100 mg at 12/21/21 2102    Lab Results:  Results for orders placed or performed during the hospital encounter of 12/07/21 (from the past 48 hour(s))  Glucose, capillary     Status: Abnormal   Collection Time: 12/21/21  5:36 AM  Result Value Ref Range   Glucose-Capillary 101 (H) 70  - 99 mg/dL    Comment: Glucose reference range applies only to samples taken after fasting for at least 8 hours.  Glucose, capillary     Status: None   Collection Time: 12/22/21  5:55 AM  Result Value Ref Range   Glucose-Capillary 97 70 - 99 mg/dL    Comment: Glucose reference range applies only to samples taken after fasting for at least 8 hours.    Blood Alcohol level:  Lab Results  Component Value Date   ETH <10 11/28/2021   ETH <10 [id number]    Metabolic Disorder Labs: Lab Results  Component Value Date   HGBA1C 6.9 (H) 11/17/2021   MPG 151.33 11/17/2021   MPG 111.15 02/03/2020   Lab Results  Component Value Date   PROLACTIN 8.1 06/01/2019   Lab Results  Component Value Date   CHOL 130 12/01/2021   TRIG 172 (H) 12/01/2021   HDL 28 (L) 12/01/2021   CHOLHDL 4.6 12/01/2021   VLDL 34 12/01/2021   LDLCALC 68 12/01/2021   LDLCALC 97 02/03/2020    Physical Findings:  Musculoskeletal: Strength & Muscle Tone: within normal limits Gait & Station: normal Patient leans: N/A  Psychiatric Specialty Exam:  Presentation  General Appearance: Appropriate for Environment; Casual   Eye Contact:Good   Speech:Clear and Coherent; Normal Rate   Speech Volume:Normal   Handedness:Right    Mood and Affect  Mood:Euthymic   Affect:Blunt    Thought Process  Thought Processes:Disorganized   Descriptions of Associations:Tangential   Orientation:Full (Time, Place and Person)   Thought Content:Scattered; Tangential; Illogical   History of Schizophrenia/Schizoaffective disorder:Yes   Duration of Psychotic Symptoms:Greater than six months   Hallucinations:No data recorded Reports AH.  Denies VH.   Ideas of Reference:None Has delusional thought content.  Suicidal Thoughts:No data recorded Denies   Homicidal  Thoughts:No data recorded Denies    Sensorium  Memory:Immediate Fair; Recent Fair   Judgment:Impaired   Insight:Poor;  Shallow    Executive Functions  Concentration: Poor   Attention Span: Poor   Recall:Fair   Fund of Knowledge:Fair   Language:Fair    Psychomotor Activity  Psychomotor Activity:No data recorded     Assets  Assets:Desire for Improvement; Resilience; Social Support    Sleep  Sleep:Sleep: Better      Physical Exam: Physical Exam Vitals reviewed.  Constitutional:      General: He is not in acute distress.    Appearance: He is normal weight. He is not toxic-appearing.  HENT:     Head: Normocephalic and atraumatic.     Mouth/Throat:     Mouth: Mucous membranes are moist.     Pharynx: Oropharynx is clear.  Pulmonary:     Effort: Pulmonary effort is normal. No respiratory distress.  Skin:    General: Skin is warm and dry.  Neurological:     Mental Status: He is alert.     Motor: No weakness.     Gait: Gait normal.  Psychiatric:        Mood and Affect: Mood normal.    Review of Systems  Constitutional:  Negative for chills and fever.  Cardiovascular:  Negative for chest pain and palpitations.  Gastrointestinal: Negative.   Genitourinary: Negative.   Musculoskeletal: Negative.   Neurological:  Negative for dizziness, tingling, tremors and headaches.  Psychiatric/Behavioral:  Negative for depression, hallucinations, memory loss, substance abuse and suicidal ideas. The patient is not nervous/anxious and does not have insomnia.    Blood pressure (!) 128/93, pulse (!) 105, temperature (!) 97.3 F (36.3 C), temperature source Oral, resp. rate 18, height 6\' 1"  (1.854 m), weight 117.9 kg, SpO2 97 %. Body mass index is 34.3 kg/m.   Treatment Plan Summary: ASSESSMENT: Principal Problem:   Schizoaffective disorder, bipolar type (HCC) Active Problems:   Tobacco use disorder   Patient appears to have minimal improvements on current regimen, which leads to believe that although he picked up his medications on time, he has not been compliant with taking  them. ACT team confirmed that patient has not had LAI per the records since 6/1.  Concerns that since beginning Abilify, patient has not been improving; tapered off Abilify, and workup for Clozaril complete, so will switch to Clozaril as that patient has failed multiple antipsychotics.   PLAN: Safety and Monitoring:             -- Involuntary admission to inpatient psychiatric unit for safety, stabilization and treatment             -- Daily contact with patient to assess and evaluate symptoms and progress in treatment             -- Patient's case to be discussed in multi-disciplinary team meeting             -- Observation Level : q15 minute checks             -- Vital signs:  q12 hours             -- Precautions: suicide, elopement, and assault   2. Psychiatric Diagnoses and Treatment:  # Schizoaffective disorder-bipolar type -Continue Zyprexa 08/31/13 mg qhs. This is a decrease in dose ase we cross titrate to clozapine, and should also provide coverage for afternoon agitation. Will likely eventually cross taper to FGA.  - Patient currently on Abilify Maintena  400 mg LAI, last given 8/16 - Continue clozapine uptitration 50 mg qhs for schizophrenia -Continue Depakote 500 mg bid - for mood stabilization -Trough level to be obtained 12/25/21 -Consider beta-blocker to manage tachycardia and anxiety   -- Metabolic profile and EKG monitoring obtained while on an atypical antipsychotic  BMI: 34.3 Lipid Panel: TG 172 HbgA1c: 6.9% QTc: 413             -- Encouraged patient to participate in unit milieu and in scheduled group therapies      3. Medical Issues Being Addressed: #Nicotine use disorder - Nicotine patch 21 mg daily   #T2DM A.m. CBG 98. Home medication Synjardi 01/999 mg BID.  Due to the fact that patient is actively psychotic, only completing a.m. sleep e.g. monitoring.  Patient on home regimen and diabetes well-controlled (A1c 6.9%), so minimal concern for  decompensation. -Metformin 1000 mg twice daily with meals - Jardiance 10 mg every morning   #Hyperlipidemia - Continue home atorvastatin 40 mg daily   4. Discharge Planning:               -- Social work and case management to assist with discharge planning and identification of hospital follow-up needs prior to discharge             -- Estimated LOS: 10-14 additional days while titrating Clozaril             -- Discharge Concerns: Need to establish a safety plan; Medication compliance and effectiveness             -- Discharge Goals: Return home with outpatient referrals for mental health follow-up including medication management/psychotherapy   Park Pope, MD 12/22/2021, 10:04 AM

## 2021-12-22 NOTE — Progress Notes (Signed)
   12/22/21 1400  Psych Admission Type (Psych Patients Only)  Admission Status Involuntary  Psychosocial Assessment  Patient Complaints Anxiety  Eye Contact Fair  Facial Expression Animated;Anxious  Affect Anxious  Speech Pressured;Rapid;Tangential  Interaction Assertive;Attention-seeking;Needy  Motor Activity Pacing  Appearance/Hygiene Unremarkable  Behavior Characteristics Cooperative  Mood Labile;Suspicious;Preoccupied  Aggressive Behavior  Effect No apparent injury  Thought Process  Coherency Disorganized  Content Delusions  Delusions Grandeur;Paranoid;Persecutory  Perception Hallucinations  Hallucination Auditory;Visual  Judgment Limited  Confusion None  Danger to Self  Current suicidal ideation? Denies  Danger to Others  Danger to Others None reported or observed

## 2021-12-23 DIAGNOSIS — F29 Unspecified psychosis not due to a substance or known physiological condition: Secondary | ICD-10-CM | POA: Diagnosis not present

## 2021-12-23 MED ORDER — DOCUSATE SODIUM 100 MG PO CAPS: 100.0000 mg | ORAL_CAPSULE | Freq: Every day | ORAL | Status: DC | PRN

## 2021-12-23 MED ORDER — WHITE PETROLATUM EX OINT
TOPICAL_OINTMENT | CUTANEOUS | Status: AC
  Filled 2021-12-23: qty 5

## 2021-12-23 NOTE — Progress Notes (Signed)
D:  Patient's self inventory sheet, patient sleeps good, no sleep medication.  Good appetite, normal energy level, good concentration.  Denied depression, hopeless and anxiety.  Denied withdrawals.  Denied SI.  Denied physical problems.  Denied physical pain.  Goal is going home.  Plans to keep encouraging those to go home and not give up.  No discharge plans. A:  Medications administered per MD orders.  Emotional support and encouragement given patient. R:  Denied SI and HI, contracts for safety.  Denied AV hallucinations.  Safety maintained with 15 minute checks.  Patient stated he slept 8 hours last night.  Patient has been walking the hallway throughout the day.

## 2021-12-23 NOTE — Progress Notes (Signed)
Adult Psychoeducational Group Note  Date:  12/23/2021 Time:  8:50 PM  Group Topic/Focus:  Wrap-Up Group:   The focus of this group is to help patients review their daily goal of treatment and discuss progress on daily workbooks.  Participation Level:  Active  Participation Quality:  Appropriate  Affect:  Appropriate  Cognitive:  Appropriate  Insight: Appropriate  Engagement in Group:  Developing/Improving  Modes of Intervention:  Discussion  Additional Comments:  Pt stated his goal for today was to focus on his treatment plan. Pt stated he accomplished his goal today. Pt stated he talked with his doctor and social worker about his care today. Pt rated his overall day a 10. Pt stated he was able to contact his parents and his aunt today which improved his overall day. Pt stated he felt better about himself today. Pt stated he was able to attend all meals. Pt stated he took all medications provided today. Pt stated he attend all groups held today. Pt stated his appetite was pretty good today. Pt rated sleep last night was pretty good. Pt stated the goal tonight was to get some rest. Pt stated he had no physical pain tonight. Pt deny visual hallucinations and auditory issues tonight. Pt denies thoughts of harming himself or others. Pt stated he would alert staff if anything changed  Felipa Furnace 12/23/2021, 8:50 PM

## 2021-12-23 NOTE — Plan of Care (Signed)
Nurse discussed anxiety, depression and coping skills with patient.  

## 2021-12-23 NOTE — Progress Notes (Addendum)
[facility] MD Progress Note  12/23/2021 10:20 AM PADEN SENGER  MRN:  [id number] Subjective:  [name] is a 46 year old male with a reported past psychiatric history of schizophrenia-paranoid, bipolar disorder housing insecurity who presented to [facility] ED on 8/3 under IVC for psychosis and bizarre behaviors.     On evaluation today, the patient continues to be psychotic and tangential.  He is disorganized and responding to internal stimuli.  He continues to make bizarre statements.  He is tangential.  Reports mood is stable.  Reports sleep is okay.  Appetite is good.  Denies SI and HI.  Reports AH of spirits but reports only those who believe in them can hear them (hence staff is unable to hear/see them).  Patient also reports seeing people "pop out of nowhere because the creator gave them real bodies". Patient denies VH today.  Denies side effects to current medications.  Reports having bowel movement this morning.  Baseline review of symptoms, specific for clozapine: Malaise/Sedation: Denies Chest pain: Denies Shortness of breath: Denies Exertional capacity: Denies Tachycardia: Denies Cough: Denies Sore Throat: Denies Fever: Denies Orthostatic hypotension (dizziness with standing): Denies Hypersalivation: Denies Constipation: Reports last bowel movement was this morning Symptoms of GERD: Denies Nausea: Denies Nocturnal enuresis: Denies   Principal Problem: Schizoaffective disorder, bipolar type (HCC) Diagnosis: Principal Problem:   Schizoaffective disorder, bipolar type (HCC) Active Problems:   Tobacco use disorder   Diabetes (HCC)   Hyperlipidemia   Total Time spent with patient: 20 minutes  Past Psychiatric History: Previous Psych Diagnoses: Schizophrenia, Schizoaffective disorder-bipolar type Prior inpatient treatment: Multiple, [facility] 04/2018 Prior outpatient treatment: [facility] History of suicide: History of homicide: Denies Psychiatric medication history:  Previously on 6/14 Zyprexa 5 mg qAM and 30 mg qHS; Prolixin 10 mg in 05/2020, Invega Sustenna, PO Invega, and Haldol (02/2020), Neurontin.  Currently taking Abilify Maintena 400 mg and Zyprexa 10 mg BID Psychiatric medication compliance history: Reports compliance, and picking up medications appropriately, per pharmacy records Neuromodulation history: Denies Current Psychiatrist:ACTT services via [name]  Past Medical History:  Past Medical History:  Diagnosis Date   Bipolar disorder (HCC)    Bronchitis    Hyperlipidemia    Paranoid schizophrenia (HCC)    Schizophrenia (HCC)     Past Surgical History:  Procedure Laterality Date   TESTICLE IMPLANTATION TO THIGH     TESTICLE SURGERY     Family History:  Family History  Problem Relation Age of Onset   Schizophrenia Neg Hx    Family Psychiatric  History: See H&P Social History:  Social History   Substance and Sexual Activity  Alcohol Use Not Currently   Comment: weekly     Social History   Substance and Sexual Activity  Drug Use Yes   Types: Marijuana   Comment: smokes hemp    Social History   Socioeconomic History   Marital status: Single    Spouse name: Not on file   Number of children: Not on file   Years of education: 12 years   Highest education level: 12th grade  Occupational History   Occupation: On disability  Tobacco Use   Smoking status: Every Day    Packs/day: 1.00    Types: Cigarettes   Smokeless tobacco: Never  Vaping Use   Vaping Use: Never used  Substance and Sexual Activity   Alcohol use: Not Currently    Comment: weekly   Drug use: Yes    Types: Marijuana    Comment: smokes hemp  Sexual activity: Yes    Birth control/protection: Condom  Other Topics Concern   Not on file  Social History Narrative   Pt lives alone in apartment complex for mentally ill   Social Determinants of Health   Financial Resource Strain: Not on file  Food Insecurity: Not on file  Transportation Needs: Not on  file  Physical Activity: Not on file  Stress: Not on file  Social Connections: Not on file   Additional Social History:       Sleep: Fair  Appetite:  Good  Current Medications: Current Facility-Administered Medications  Medication Dose Route Frequency Provider Last Rate Last Admin   alum & mag hydroxide-simeth (MAALOX/MYLANTA) 200-200-20 MG/5ML suspension 30 mL  30 mL Oral Q4H PRN Bobbitt, Shalon E, NP   30 mL at 12/20/21 1135   atorvastatin (LIPITOR) tablet 40 mg  40 mg Oral QHS Bobbitt, Shalon E, NP   40 mg at 12/22/21 2048   benztropine (COGENTIN) tablet 1 mg  1 mg Oral BID PRN Harlow Asa, MD   1 mg at 12/19/21 [id number]   Or   benztropine mesylate (COGENTIN) injection 1 mg  1 mg Intramuscular BID PRN Harlow Asa, MD       bismuth subsalicylate (PEPTO BISMOL) chewable tablet 524 mg  524 mg Oral Q4H PRN Minda Ditto, RPH   524 mg at 12/22/21 [id number]   cloZAPine (CLOZARIL) tablet 62.5 mg  62.5 mg Oral QHS Rosezetta Schlatter, MD       Followed by   Derrill Memo ON 12/24/2021] cloZAPine (CLOZARIL) tablet 75 mg  75 mg Oral QHS Rosezetta Schlatter, MD       Followed by   Derrill Memo ON 12/25/2021] cloZAPine (CLOZARIL) tablet 87.5 mg  87.5 mg Oral QHS Rosezetta Schlatter, MD       Followed by   Derrill Memo ON 12/26/2021] cloZAPine (CLOZARIL) tablet 100 mg  100 mg Oral QHS Rosezetta Schlatter, MD       divalproex (DEPAKOTE) DR tablet 500 mg  500 mg Oral Q12H Massengill, Nathan, MD   500 mg at 12/23/21 0803   docusate sodium (COLACE) capsule 100 mg  100 mg Oral Daily Massengill, Ovid Curd, MD   100 mg at 12/22/21 0737   empagliflozin (JARDIANCE) tablet 10 mg  10 mg Oral Daily Rosezetta Schlatter, MD   10 mg at 12/23/21 0803   hydrOXYzine (ATARAX) tablet 25 mg  25 mg Oral TID PRN Bobbitt, Shalon E, NP   25 mg at 12/22/21 0737   ibuprofen (ADVIL) tablet 800 mg  800 mg Oral BID PRN Bobbitt, Shalon E, NP   800 mg at 12/22/21 1640   magnesium hydroxide (MILK OF MAGNESIA) suspension 30 mL  30 mL Oral Daily PRN Bobbitt, Shalon E,  NP   30 mL at 12/10/21 2029   melatonin tablet 5 mg  5 mg Oral QHS PRN Massengill, Ovid Curd, MD   5 mg at 12/20/21 2035   metFORMIN (GLUCOPHAGE) tablet 1,000 mg  1,000 mg Oral BID WC Bobbitt, Shalon E, NP   1,000 mg at 12/23/21 [id number]   Muscle Rub CREA   Topical PRN Rosezetta Schlatter, MD       nicotine (NICODERM CQ - dosed in mg/24 hours) patch 21 mg  21 mg Transdermal Daily Rosezetta Schlatter, MD   21 mg at 12/23/21 0804   OLANZapine (ZYPREXA) injection 5 mg  5 mg Intramuscular Q8H PRN Massengill, Ovid Curd, MD       OLANZapine (ZYPREXA) tablet 15 mg  15 mg Oral  QHS Massengill, Ovid Curd, MD   15 mg at 12/22/21 2047   OLANZapine (ZYPREXA) tablet 5 mg  5 mg Oral BID Massengill, Ovid Curd, MD   5 mg at 12/23/21 0610   OLANZapine zydis (ZYPREXA) disintegrating tablet 5 mg  5 mg Oral Q8H PRN Massengill, Ovid Curd, MD   5 mg at 12/21/21 1448   polyethylene glycol (MIRALAX / GLYCOLAX) packet 17 g  17 g Oral Daily PRN Massengill, Ovid Curd, MD       sodium chloride (OCEAN) 0.65 % nasal spray 1 spray  1 spray Each Nare PRN Rosezetta Schlatter, MD   1 spray at 12/23/21 0809   temazepam (RESTORIL) capsule 30 mg  30 mg Oral QHS PRN Harlow Asa, MD   30 mg at 12/22/21 2047   traZODone (DESYREL) tablet 100 mg  100 mg Oral QHS PRN Harlow Asa, MD   100 mg at 12/22/21 2048    Lab Results:  Results for orders placed or performed during the hospital encounter of 12/07/21 (from the past 48 hour(s))  Glucose, capillary     Status: None   Collection Time: 12/22/21  5:55 AM  Result Value Ref Range   Glucose-Capillary 97 70 - 99 mg/dL    Comment: Glucose reference range applies only to samples taken after fasting for at least 8 hours.    Blood Alcohol level:  Lab Results  Component Value Date   ETH <10 11/28/2021   ETH <10 [id number]    Metabolic Disorder Labs: Lab Results  Component Value Date   HGBA1C 6.9 (H) 11/17/2021   MPG 151.33 11/17/2021   MPG 111.15 02/03/2020   Lab Results  Component Value Date    PROLACTIN 8.1 06/01/2019   Lab Results  Component Value Date   CHOL 130 12/01/2021   TRIG 172 (H) 12/01/2021   HDL 28 (L) 12/01/2021   CHOLHDL 4.6 12/01/2021   VLDL 34 12/01/2021   LDLCALC 68 12/01/2021   LDLCALC 97 02/03/2020    Physical Findings:  Musculoskeletal: Strength & Muscle Tone: within normal limits Gait & Station: normal Patient leans: N/A  Psychiatric Specialty Exam:  Presentation  General Appearance: Appropriate for Environment; Casual   Eye Contact:Good   Speech:Clear and Coherent; Normal Rate   Speech Volume:Normal   Handedness:Right    Mood and Affect  Mood:Euthymic   Affect:Blunt    Thought Process  Thought Processes:Disorganized   Descriptions of Associations:Tangential   Orientation:Full (Time, Place and Person)   Thought Content:Scattered; Tangential; Illogical   History of Schizophrenia/Schizoaffective disorder:Yes   Duration of Psychotic Symptoms:Greater than six months   Hallucinations:No data recorded Reports AH.  Denies VH.   Ideas of Reference:None Has delusional thought content.  Suicidal Thoughts:No data recorded Denies   Homicidal Thoughts:No data recorded Denies    Sensorium  Memory:Immediate Fair; Recent Fair   Judgment:Impaired   Insight:Poor; Shallow    Executive Functions  Concentration: Poor   Attention Span: Poor   Recall:Fair   Fund of Knowledge:Fair   Language:Fair    Psychomotor Activity  Psychomotor Activity:No data recorded     Assets  Assets:Desire for Improvement; Resilience; Social Support    Sleep  Sleep:Sleep: Better      Physical Exam: Physical Exam Vitals reviewed.  Constitutional:      General: He is not in acute distress.    Appearance: He is normal weight. He is not toxic-appearing.  HENT:     Head: Normocephalic and atraumatic.     Mouth/Throat:     Mouth:  Mucous membranes are moist.     Pharynx: Oropharynx is clear.   Pulmonary:     Effort: Pulmonary effort is normal. No respiratory distress.  Skin:    General: Skin is warm and dry.  Neurological:     Mental Status: He is alert.     Motor: No weakness.     Gait: Gait normal.  Psychiatric:        Mood and Affect: Mood normal.    Review of Systems  Constitutional:  Negative for chills and fever.  Cardiovascular:  Negative for chest pain and palpitations.  Gastrointestinal: Negative.   Genitourinary: Negative.   Musculoskeletal: Negative.   Neurological:  Negative for dizziness, tingling, tremors and headaches.  Psychiatric/Behavioral:  Negative for depression, hallucinations, memory loss, substance abuse and suicidal ideas. The patient is not nervous/anxious and does not have insomnia.    Blood pressure 120/74, pulse 98, temperature 98 F (36.7 C), temperature source Oral, resp. rate 20, height 6\' 1"  (1.854 m), weight 117.9 kg, SpO2 98 %. Body mass index is 34.3 kg/m.   Treatment Plan Summary: ASSESSMENT: Principal Problem:   Schizoaffective disorder, bipolar type ([place]) Active Problems:   Tobacco use disorder   Patient appears to have minimal improvements on current regimen, which leads [place] to believe that although he picked up his medications on time, he has not been compliant with taking them. ACT team confirmed that patient has not had LAI per the records since 6/1.  Concerns that since beginning Abilify, patient has not been improving; tapered off Abilify, and workup for Clozaril complete, so will switch to Clozaril as that patient has failed multiple antipsychotics.   PLAN: Safety and Monitoring:             -- Involuntary admission to inpatient psychiatric unit for safety, stabilization and treatment             -- Daily contact with patient to assess and evaluate symptoms and progress in treatment             -- Patient's case to be discussed in multi-disciplinary team meeting             -- Observation Level : q15 minute checks              -- Vital signs:  q12 hours             -- Precautions: suicide, elopement, and assault   2. Psychiatric Diagnoses and Treatment:  # Schizoaffective disorder-bipolar type -Continue Zyprexa 08/31/13 mg qhs. This is a decrease in dose ase we cross titrate to clozapine, and should also provide coverage for afternoon agitation. Will likely eventually cross taper to FGA.  - Patient currently on Abilify Maintena 400 mg LAI, last given 8/16 - Continue clozapine uptitration 62.5 mg qhs for schizophrenia -Continue Depakote 500 mg bid - for mood stabilization -Trough level to be obtained 12/25/21 -Consider beta-blocker to manage tachycardia and anxiety if HR remains persistently elevated   -- Metabolic profile and EKG monitoring obtained while on an atypical antipsychotic  BMI: 34.3 Lipid Panel: TG 172 HbgA1c: 6.9% QTc: 413             -- Encouraged patient to participate in unit milieu and in scheduled group therapies      3. Medical Issues Being Addressed: #Nicotine use disorder - Nicotine patch 21 mg daily   #T2DM A.m. CBG 98. Home medication Synjardi 01/999 mg BID.  Due to the fact that patient is actively psychotic,  only completing a.m. sleep e.g. monitoring.  Patient on home regimen and diabetes well-controlled (A1c 6.9%), so minimal concern for decompensation. -Metformin 1000 mg twice daily with meals - Jardiance 10 mg every morning   #Hyperlipidemia - Continue home atorvastatin 40 mg daily   4. Discharge Planning:               -- Social work and case management to assist with discharge planning and identification of hospital follow-up needs prior to discharge             -- Estimated LOS: 10-14 additional days while titrating Clozaril             -- Discharge Concerns: Need to establish a safety plan; Medication compliance and effectiveness             -- Discharge Goals: Return home with outpatient referrals for mental health follow-up including medication  management/psychotherapy   Park Pope, MD 12/23/2021, 10:20 AM

## 2021-12-23 NOTE — [facility] Group Notes (Signed)
Adult Psychoeducational Group Note Date:  12/16/2021 Time:  [id number] Group Topic/Focus: PROGRESSIVE RELAXATION. A group where deep breathing is taught and tensing and relaxation muscle groups is used. Imagery is used as well.  Pts are asked to imagine 3 pillars that hold them up when they are not able to hold themselves up and to share that with the group.  Participation Level:  Active  Participation Quality:  Appropriate  Affect:  Appropriate  Cognitive:  Oriented  Insight: Improving  Engagement in Group:  Engaged  Modes of Intervention:  Activity, Discussion, Education, and Support  Additional Comments:  Rates his energy at a 10/10.  God family and friends holds him up.  [name]

## 2021-12-23 NOTE — Group Note (Signed)
[facility] LCSW Group Therapy Note  Date/Time:  12/23/2021  11:00AM-12:00PM  Type of Therapy and Topic:  Group Therapy:  Music and Mood  Participation Level:  Active   Description of Group: In this process group, members listened to a variety of genres of music and identified that different types of music evoke different responses.  Patients were encouraged to identify music that was soothing for them and music that was energizing for them.  Patients discussed how this knowledge can help with wellness and recovery in various ways including managing depression and anxiety as well as encouraging healthy sleep habits.    Therapeutic Goals: Patients will explore the impact of different varieties of music on mood Patients will verbalize the thoughts they have when listening to different types of music Patients will identify music that is soothing to them as well as music that is energizing to them Patients will discuss how to use this knowledge to assist in maintaining wellness and recovery Patients will explore the use of music as a coping skill  Summary of Patient Progress:  At the beginning of group, patient expressed his mood was fine, better than it has been.  At the end of group, patient expressed his mood was good.    Therapeutic Modalities: Solution Focused Brief Therapy Activity   Ambrose Mantle, LCSW

## 2021-12-23 NOTE — Progress Notes (Signed)
   12/23/21 2145  Psych Admission Type (Psych Patients Only)  Admission Status Involuntary  Psychosocial Assessment  Patient Complaints None  Eye Contact Brief  Facial Expression Animated;Anxious  Affect Anxious;Preoccupied  Speech Logical/coherent;Pressured  Interaction Assertive  Motor Activity Pacing;Other (Comment)  Appearance/Hygiene Meticulous  Behavior Characteristics Cooperative;Appropriate to situation  Mood Anxious;Preoccupied;Pleasant  Thought Process  Coherency Disorganized;Flight of ideas;Tangential  Content Delusions  Delusions Grandeur  Perception Hallucinations  Hallucination Auditory;Visual  Judgment Limited  Confusion None  Danger to Self  Current suicidal ideation? Denies

## 2021-12-24 ENCOUNTER — Encounter (HOSPITAL_COMMUNITY): Payer: Self-pay

## 2021-12-24 DIAGNOSIS — F250 Schizoaffective disorder, bipolar type: Secondary | ICD-10-CM | POA: Diagnosis not present

## 2021-12-24 DIAGNOSIS — F17200 Nicotine dependence, unspecified, uncomplicated: Secondary | ICD-10-CM | POA: Diagnosis not present

## 2021-12-24 DIAGNOSIS — E119 Type 2 diabetes mellitus without complications: Secondary | ICD-10-CM | POA: Diagnosis not present

## 2021-12-24 MED ORDER — BENZTROPINE MESYLATE 1 MG PO TABS: 1.0000 mg | ORAL_TABLET | Freq: Three times a day (TID) | ORAL | Status: DC | PRN

## 2021-12-24 MED ORDER — LORAZEPAM 1 MG PO TABS: 1.0000 mg | ORAL_TABLET | Freq: Three times a day (TID) | ORAL | Status: DC | PRN

## 2021-12-24 MED ORDER — BENZTROPINE MESYLATE 0.5 MG PO TABS
0.5000 mg | ORAL_TABLET | Freq: Two times a day (BID) | ORAL | Status: DC
Start: 2021-12-24 — End: 2021-12-27
  Administered 2021-12-24 – 2021-12-27 (×6): 0.5 mg via ORAL
  Filled 2021-12-24 (×10): qty 1

## 2021-12-24 MED ORDER — BENZTROPINE MESYLATE 1 MG/ML IJ SOLN: 1.0000 mg | Freq: Three times a day (TID) | INTRAMUSCULAR | Status: DC | PRN

## 2021-12-24 MED ORDER — WHITE PETROLATUM EX OINT
TOPICAL_OINTMENT | CUTANEOUS | Status: AC
  Filled 2021-12-24: qty 5

## 2021-12-24 MED ORDER — HALOPERIDOL LACTATE 5 MG/ML IJ SOLN: 5.0000 mg | Freq: Three times a day (TID) | INTRAMUSCULAR | Status: DC | PRN

## 2021-12-24 MED ORDER — HALOPERIDOL 5 MG PO TABS: 5.0000 mg | ORAL_TABLET | Freq: Three times a day (TID) | ORAL | Status: DC | PRN

## 2021-12-24 MED ORDER — HALOPERIDOL 5 MG PO TABS
5.0000 mg | ORAL_TABLET | Freq: Two times a day (BID) | ORAL | Status: DC
  Administered 2021-12-24 – 2022-01-01 (×16): 5 mg via ORAL
  Filled 2021-12-24 (×21): qty 1

## 2021-12-24 MED ORDER — LORAZEPAM 2 MG/ML IJ SOLN: 1.0000 mg | Freq: Three times a day (TID) | INTRAMUSCULAR | Status: DC | PRN

## 2021-12-24 NOTE — Progress Notes (Signed)
Adult Psychoeducational Group Note  Date:  12/24/2021 Time:  9:09 PM  Group Topic/Focus:  Wrap-Up Group:   The focus of this group is to help patients review their daily goal of treatment and discuss progress on daily workbooks.  Participation Level:  Active  Participation Quality:  Appropriate  Affect:  Appropriate  Cognitive:  Disorganized and Confused  Insight: Limited  Engagement in Group:  Limited  Modes of Intervention:  Discussion  Additional Comments:  Pt stated his goal for today was to focus on his treatment plan. Pt stated he accomplished his goal today. Pt stated he talked with his doctor and social worker about his care today. Pt rated his overall day a 10.  Pt stated he made no calls today. Pt stated he felt better about himself today. Pt stated he was able to attend all meals. Pt stated he took all medications provided today. Pt stated he attend all groups held today. Pt stated his appetite was pretty good today. Pt rated sleep last night was pretty good. Pt stated the goal tonight was to get some rest. Pt stated he had no physical pain tonight. Pt deny visual hallucinations and auditory issues tonight. Pt denies thoughts of harming himself or others. Pt stated he would alert staff if anything changed  Felipa Furnace 12/24/2021, 9:09 PM

## 2021-12-24 NOTE — Group Note (Signed)
LCSW Group Therapy Note   Group Date: 12/24/2021 Start Time: 1300 End Time: 1400   Type of Therapy and Topic:  Group Therapy: Boundaries  Participation Level:  Did Not Attend  Description of Group: This group will address the use of boundaries in their personal lives. Patients will explore why boundaries are important, the difference between healthy and unhealthy boundaries, and negative and postive outcomes of different boundaries and will look at how boundaries can be crossed.  Patients will be encouraged to identify current boundaries in their own lives and identify what kind of boundary is being set. Facilitators will guide patients in utilizing problem-solving interventions to address and correct types boundaries being used and to address when no boundary is being used. Understanding and applying boundaries will be explored and addressed for obtaining and maintaining a balanced life. Patients will be encouraged to explore ways to assertively make their boundaries and needs known to significant others in their lives, using other group members and facilitator for role play, support, and feedback.  Therapeutic Goals:  1.  Patient will identify areas in their life where setting clear boundaries could be  used to improve their life.  2.  Patient will identify signs/triggers that a boundary is not being respected. 3.  Patient will identify two ways to set boundaries in order to achieve balance in  their lives: 4.  Patient will demonstrate ability to communicate their needs and set boundaries  through discussion and/or role plays  Summary of Patient Progress:  Did not attend  Therapeutic Modalities:   Cognitive Behavioral Therapy Solution-Focused Therapy  Chanse Kagel E Erez Mccallum, LCSW 12/24/2021  2:09 PM    

## 2021-12-24 NOTE — Progress Notes (Signed)
[facility] MD Progress Note  12/24/2021 7:28 AM [name]  MRN:  [id number] Subjective:  [name] is a 46 year old male with a reported past psychiatric history of schizophrenia-paranoid, bipolar disorder housing insecurity who presented to [facility] ED on 8/3 under IVC for psychosis and bizarre behaviors.    Yesterday's recommendations per psychiatry team: -Continue Zyprexa 08/31/13 mg qhs. This is a decrease in dose ase we cross titrate to clozapine, and should also provide coverage for afternoon agitation. Will likely eventually cross taper to FGA.  - Patient currently on Abilify Maintena 400 mg LAI, last given 8/16 - Continue clozapine uptitration 62.5 mg qhs for schizophrenia -Continue Depakote 500 mg bid - for mood stabilization -Trough level to be obtained 12/25/21 -Consider beta-blocker to manage tachycardia and anxiety if HR remains persistently elevated   On evaluation today: Patient is less tangential and more appropriate in conversation unless prompted, at which point he becomes more disorganized.  For instance, he answered questions mostly appropriately with normal latency of responses until he was asked about [employer].  At which point, he began to make bizarre statements such as "[employer] can cure schizophrenia, as they did with me," and "know they are not listening out for me but they can hear conversations depending on the frequencies."  He is not observed responding to internal stimuli on this writer's assessment.  He reports he is mood is stable and he feels closer to discharge.  Reports sleep is okay.  Appetite is good.  Denies SI, HI, and AVH. Denies side effects to current medications.  Reports having bowel movement yesterday.  Daily review of symptoms, specific for clozapine: Malaise/Sedation: Denies Chest pain: Denies Shortness of breath: Denies Exertional capacity: Denies Tachycardia: Denies Cough: Denies Sore Throat: Denies Fever: Denies Orthostatic hypotension (dizziness  with standing): Denies Hypersalivation: Denies Constipation: Reports last bowel movement was yesterday Symptoms of GERD: Denies Nausea: Denies Nocturnal enuresis: Denies   Principal Problem: Schizoaffective disorder, bipolar type ([place]) Diagnosis: Principal Problem:   Schizoaffective disorder, bipolar type ([place]) Active Problems:   Tobacco use disorder   Diabetes ([place])   Hyperlipidemia   Total Time spent with patient: 20 minutes  Past Psychiatric History: Previous Psych Diagnoses: Schizophrenia, Schizoaffective disorder-bipolar type Prior inpatient treatment: Multiple, [facility] 04/2018 Prior outpatient treatment: [facility] History of suicide: History of homicide: Denies Psychiatric medication history: Previously on 6/14 Zyprexa 5 mg qAM and 30 mg qHS; Prolixin 10 mg in 05/2020, Invega Sustenna, PO Invega, and Haldol (02/2020), Neurontin.  Currently taking Abilify Maintena 400 mg and Zyprexa 10 mg BID Psychiatric medication compliance history: Reports compliance, and picking up medications appropriately, per pharmacy records Neuromodulation history: Denies Current Psychiatrist:[facility] services via [name]  Past Medical History:  Past Medical History:  Diagnosis Date   Bipolar disorder ([place])    Bronchitis    Hyperlipidemia    Paranoid schizophrenia ([place])    Schizophrenia ([place])     Past Surgical History:  Procedure Laterality Date   TESTICLE IMPLANTATION TO THIGH     TESTICLE SURGERY     Family History:  Family History  Problem Relation Age of Onset   Schizophrenia Neg Hx    Family Psychiatric  History: See H&P Social History:  Social History   Substance and Sexual Activity  Alcohol Use Not Currently   Comment: weekly     Social History   Substance and Sexual Activity  Drug Use Yes   Types: Marijuana   Comment: smokes hemp    Social History  Socioeconomic History   Marital status: Single    Spouse name: Not on file   Number of children: Not on file    Years of education: 12 years   Highest education level: 12th grade  Occupational History   Occupation: On disability  Tobacco Use   Smoking status: Every Day    Packs/day: 1.00    Types: Cigarettes   Smokeless tobacco: Never  Vaping Use   Vaping Use: Never used  Substance and Sexual Activity   Alcohol use: Not Currently    Comment: weekly   Drug use: Yes    Types: Marijuana    Comment: smokes hemp   Sexual activity: Yes    Birth control/protection: Condom  Other Topics Concern   Not on file  Social History Narrative   Pt lives alone in apartment complex for mentally ill   Social Determinants of Health   Financial Resource Strain: Not on file  Food Insecurity: Not on file  Transportation Needs: Not on file  Physical Activity: Not on file  Stress: Not on file  Social Connections: Not on file   Additional Social History:       Sleep: Fair  Appetite:  Good  Current Medications: Current Facility-Administered Medications  Medication Dose Route Frequency Provider Last Rate Last Admin   alum & mag hydroxide-simeth (MAALOX/MYLANTA) 200-200-20 MG/5ML suspension 30 mL  30 mL Oral Q4H PRN Bobbitt, Shalon E, NP   30 mL at 12/20/21 1135   atorvastatin (LIPITOR) tablet 40 mg  40 mg Oral QHS Bobbitt, Shalon E, NP   40 mg at 12/23/21 2056   benztropine (COGENTIN) tablet 1 mg  1 mg Oral BID PRN Harlow Asa, MD   1 mg at 12/19/21 0737   Or   benztropine mesylate (COGENTIN) injection 1 mg  1 mg Intramuscular BID PRN Harlow Asa, MD       bismuth subsalicylate (PEPTO BISMOL) chewable tablet 524 mg  524 mg Oral Q4H PRN Minda Ditto, RPH   524 mg at 12/22/21 0739   cloZAPine (CLOZARIL) tablet 75 mg  75 mg Oral QHS Rosezetta Schlatter, MD       Followed by   Derrill Memo ON 12/25/2021] cloZAPine (CLOZARIL) tablet 87.5 mg  87.5 mg Oral QHS Rosezetta Schlatter, MD       Followed by   Derrill Memo ON 12/26/2021] cloZAPine (CLOZARIL) tablet 100 mg  100 mg Oral QHS Rosezetta Schlatter, MD        divalproex (DEPAKOTE) DR tablet 500 mg  500 mg Oral Q12H Massengill, Nathan, MD   500 mg at 12/23/21 2056   docusate sodium (COLACE) capsule 100 mg  100 mg Oral Daily PRN France Ravens, MD       empagliflozin (JARDIANCE) tablet 10 mg  10 mg Oral Daily Rosezetta Schlatter, MD   10 mg at 12/23/21 0803   hydrOXYzine (ATARAX) tablet 25 mg  25 mg Oral TID PRN Bobbitt, Shalon E, NP   25 mg at 12/23/21 2055   ibuprofen (ADVIL) tablet 800 mg  800 mg Oral BID PRN Bobbitt, Shalon E, NP   800 mg at 12/23/21 1255   magnesium hydroxide (MILK OF MAGNESIA) suspension 30 mL  30 mL Oral Daily PRN Bobbitt, Shalon E, NP   30 mL at 12/10/21 2029   melatonin tablet 5 mg  5 mg Oral QHS PRN Massengill, Ovid Curd, MD   5 mg at 12/20/21 2035   metFORMIN (GLUCOPHAGE) tablet 1,000 mg  1,000 mg Oral BID WC Bobbitt, Shalon  E, NP   1,000 mg at 12/23/21 1601   Muscle Rub CREA   Topical PRN Rosezetta Schlatter, MD       nicotine (NICODERM CQ - dosed in mg/24 hours) patch 21 mg  21 mg Transdermal Daily Rosezetta Schlatter, MD   21 mg at 12/23/21 0804   OLANZapine (ZYPREXA) injection 5 mg  5 mg Intramuscular Q8H PRN Massengill, Ovid Curd, MD       OLANZapine (ZYPREXA) tablet 15 mg  15 mg Oral QHS Massengill, Ovid Curd, MD   15 mg at 12/23/21 2055   OLANZapine (ZYPREXA) tablet 5 mg  5 mg Oral BID Massengill, Ovid Curd, MD   5 mg at 12/24/21 0615   OLANZapine zydis (ZYPREXA) disintegrating tablet 5 mg  5 mg Oral Q8H PRN Massengill, Ovid Curd, MD   5 mg at 12/21/21 1448   polyethylene glycol (MIRALAX / GLYCOLAX) packet 17 g  17 g Oral Daily PRN Massengill, Ovid Curd, MD       sodium chloride (OCEAN) 0.65 % nasal spray 1 spray  1 spray Each Nare PRN Rosezetta Schlatter, MD   1 spray at 12/23/21 1603   temazepam (RESTORIL) capsule 30 mg  30 mg Oral QHS PRN Harlow Asa, MD   30 mg at 12/22/21 2047   traZODone (DESYREL) tablet 100 mg  100 mg Oral QHS PRN Harlow Asa, MD   100 mg at 12/23/21 2055    Lab Results:  No results found for this or any previous visit  (from the past 13 hour(s)).   Blood Alcohol level:  Lab Results  Component Value Date   ETH <10 11/28/2021   ETH <10 68/03/7516    Metabolic Disorder Labs: Lab Results  Component Value Date   HGBA1C 6.9 (H) 11/17/2021   MPG 151.33 11/17/2021   MPG 111.15 02/03/2020   Lab Results  Component Value Date   PROLACTIN 8.1 06/01/2019   Lab Results  Component Value Date   CHOL 130 12/01/2021   TRIG 172 (H) 12/01/2021   HDL 28 (L) 12/01/2021   CHOLHDL 4.6 12/01/2021   VLDL 34 12/01/2021   LDLCALC 68 12/01/2021   LDLCALC 97 02/03/2020    Physical Findings:  Musculoskeletal: Strength & Muscle Tone: within normal limits Gait & Station: normal Patient leans: N/A  Psychiatric Specialty Exam:  Presentation  General Appearance: Appropriate for Environment; Casual   Eye Contact:Good   Speech:Clear and Coherent; Normal Rate   Speech Volume:Normal   Handedness:Right    Mood and Affect  Mood:Euthymic   Affect:Blunt    Thought Process  Thought Processes:Disorganized   Descriptions of Associations:Tangential   Orientation:Full (Time, Place and Person)   Thought Content:Scattered; Tangential; Illogical   History of Schizophrenia/Schizoaffective disorder:Yes   Duration of Psychotic Symptoms:Greater than six months   Hallucinations:No data recorded Denies AVH.   Ideas of Reference:None Has delusional thought content.  Suicidal Thoughts:No data recorded Denies   Homicidal Thoughts:No data recorded Denies    Sensorium  Memory:Immediate Fair; Recent Fair   Judgment:Impaired   Insight:Poor; Shallow    Executive Functions  Concentration: Poor   Attention Span: Poor   Recall:Fair   Fund of Knowledge:Fair   Language:Fair    Psychomotor Activity  Psychomotor Activity:No data recorded Normal    Assets  Assets:Desire for Improvement; Resilience; Social Support    Sleep  Sleep:Sleep: Better      Physical  Exam: Physical Exam Vitals reviewed.  Constitutional:      General: He is not in acute distress.  Appearance: He is normal weight. He is not toxic-appearing.  HENT:     Head: Normocephalic and atraumatic.     Mouth/Throat:     Mouth: Mucous membranes are moist.     Pharynx: Oropharynx is clear.  Pulmonary:     Effort: Pulmonary effort is normal. No respiratory distress.  Skin:    General: Skin is warm and dry.  Neurological:     Mental Status: He is alert.     Motor: No weakness.     Gait: Gait normal.  Psychiatric:        Mood and Affect: Mood normal.    Review of Systems  Constitutional:  Negative for chills and fever.  Cardiovascular:  Negative for chest pain and palpitations.  Gastrointestinal: Negative.   Genitourinary: Negative.   Musculoskeletal: Negative.   Neurological:  Negative for dizziness, tingling, tremors and headaches.  Psychiatric/Behavioral:  Negative for depression, hallucinations, memory loss, substance abuse and suicidal ideas. The patient is not nervous/anxious and does not have insomnia.    Blood pressure (!) 131/102, pulse 99, temperature 98 F (36.7 C), temperature source Oral, resp. rate 20, height '6\' 1"'  (1.854 m), weight 117.9 kg, SpO2 98 %. Body mass index is 34.3 kg/m.   Treatment Plan Summary: ASSESSMENT: Principal Problem:   Schizoaffective disorder, bipolar type ([place]) Active Problems:   Tobacco use disorder   Patient appears to have inconsistent improvements on current regimen, so in addition to Clozaril will need to add on a first generation antipsychotic.   PLAN: Safety and Monitoring:             -- Involuntary admission to inpatient psychiatric unit for safety, stabilization and treatment             -- Daily contact with patient to assess and evaluate symptoms and progress in treatment             -- Patient's case to be discussed in multi-disciplinary team meeting             -- Observation Level : q15 minute checks              -- Vital signs:  q12 hours             -- Precautions: suicide, elopement, and assault   2. Psychiatric Diagnoses and Treatment:  # Schizoaffective disorder-bipolar type -Discontinue Zyprexa 08/31/13 mg, as patient would likely benefit more from an FGA -Start Haldol 5 mg every 12 hours - Patient currently on Abilify Maintena 400 mg LAI, last given 8/16 - Continue clozapine uptitration for schizophrenia 75 mg tonight, to continue to increase by 12.5 mg daily.  -Weekly Labs to be monitored 8/29: CBC, CRP, ESR, BNP, troponin, CMP, and EKG -Continue Depakote 500 mg bid - for mood stabilization -Trough level to be obtained 12/25/21 -Consider beta-blocker to manage tachycardia and anxiety if HR remains persistently elevated   -- Metabolic profile and EKG monitoring obtained while on an atypical antipsychotic  BMI: 34.3 Lipid Panel: TG 172 HbgA1c: 6.9% QTc: 413             -- Encouraged patient to participate in unit milieu and in scheduled group therapies      3. Medical Issues Being Addressed: #Nicotine use disorder - Nicotine patch 21 mg daily   #T2DM Home medication Synjardi 01/999 mg BID.  Due to the fact that patient's CBGs have been largely WNL since admission and patient on home regimen and diabetes well-controlled (A1c 6.9%), so minimal concern  for decompensation, we will discontinue a.m. CBG checks. - Continue metformin 1000 mg twice daily with meals - Continue Jardiance 10 mg every morning   #Hyperlipidemia - Continue home atorvastatin 40 mg daily   4. Discharge Planning:               -- Social work and case management to assist with discharge planning and identification of hospital follow-up needs prior to discharge             -- Estimated LOS: 10-14 additional days while titrating Clozaril             -- Discharge Concerns: Need to establish a safety plan; Medication compliance and effectiveness             -- Discharge Goals: Return home with outpatient referrals for  mental health follow-up including medication management/psychotherapy   Rosezetta Schlatter, MD 12/24/2021, 7:28 AM

## 2021-12-24 NOTE — Progress Notes (Signed)
   12/24/21 2100  Psych Admission Type (Psych Patients Only)  Admission Status Involuntary  Psychosocial Assessment  Patient Complaints Suspiciousness  Eye Contact Brief  Facial Expression Animated  Affect Preoccupied  Speech Pressured  Interaction Assertive  Motor Activity Pacing  Appearance/Hygiene Unremarkable  Behavior Characteristics Cooperative  Mood Preoccupied;Pleasant  Aggressive Behavior  Effect No apparent injury  Thought Process  Coherency Disorganized;Flight of ideas;Loose associations  Content Delusions;Confabulation  Delusions Grandeur  Perception Hallucinations  Hallucination Auditory;Visual  Judgment Limited  Confusion None  Danger to Self  Current suicidal ideation? Denies  Danger to Others  Danger to Others None reported or observed

## 2021-12-24 NOTE — BH IP Treatment Plan (Signed)
Interdisciplinary Treatment and Diagnostic Plan Update  12/24/2021 Time of Session: 0830 [name] MRN: [id number]  Principal Diagnosis: Schizoaffective disorder, bipolar type [facility])  Secondary Diagnoses: Principal Problem:   Schizoaffective disorder, bipolar type (H[facility]) Active Problems:   Tobacco use disorder   Diabetes ([facility])   Hyperlipidemia   Current Medications:  Current Facility-Administered Medications  Medication Dose Route Frequency Provider Last Rate Last Admin   alum & mag hydroxide-simeth (MAALOX/MYLANTA) 200-200-20 MG/5ML suspension 30 mL  30 mL Oral Q4H PRN Bobbitt, Shalon E, NP   30 mL at 12/20/21 1135   atorvastatin (LIPITOR) tablet 40 mg  40 mg Oral QHS Bobbitt, Shalon E, NP   40 mg at 12/23/21 2056   benztropine (COGENTIN) tablet 1 mg  1 mg Oral BID PRN Comer Locket, MD   1 mg at 12/19/21 9735   Or   benztropine mesylate (COGENTIN) injection 1 mg  1 mg Intramuscular BID PRN Comer Locket, MD       bismuth subsalicylate (PEPTO BISMOL) chewable tablet 524 mg  524 mg Oral Q4H PRN Otho Bellows, RPH   524 mg at 12/22/21 0739   cloZAPine (CLOZARIL) tablet 75 mg  75 mg Oral QHS Lamar Sprinkles, MD       Followed by   Melene Muller ON 12/25/2021] cloZAPine (CLOZARIL) tablet 87.5 mg  87.5 mg Oral QHS Lamar Sprinkles, MD       Followed by   Melene Muller ON 12/26/2021] cloZAPine (CLOZARIL) tablet 100 mg  100 mg Oral QHS Lamar Sprinkles, MD       divalproex (DEPAKOTE) DR tablet 500 mg  500 mg Oral Q12H Massengill, Nathan, MD   500 mg at 12/24/21 3299   docusate sodium (COLACE) capsule 100 mg  100 mg Oral Daily PRN Park Pope, MD       empagliflozin (JARDIANCE) tablet 10 mg  10 mg Oral Daily Lamar Sprinkles, MD   10 mg at 12/24/21 2426   hydrOXYzine (ATARAX) tablet 25 mg  25 mg Oral TID PRN Bobbitt, Shalon E, NP   25 mg at 12/23/21 2055   ibuprofen (ADVIL) tablet 800 mg  800 mg Oral BID PRN Bobbitt, Shalon E, NP   800 mg at 12/24/21 1123   magnesium hydroxide (MILK OF MAGNESIA)  suspension 30 mL  30 mL Oral Daily PRN Bobbitt, Shalon E, NP   30 mL at 12/10/21 2029   melatonin tablet 5 mg  5 mg Oral QHS PRN Massengill, Harrold Donath, MD   5 mg at 12/20/21 2035   metFORMIN (GLUCOPHAGE) tablet 1,000 mg  1,000 mg Oral BID WC Bobbitt, Shalon E, NP   1,000 mg at 12/24/21 8341   Muscle Rub CREA   Topical PRN Lamar Sprinkles, MD       nicotine (NICODERM CQ - dosed in mg/24 hours) patch 21 mg  21 mg Transdermal Daily Lamar Sprinkles, MD   21 mg at 12/24/21 0806   OLANZapine (ZYPREXA) injection 5 mg  5 mg Intramuscular Q8H PRN Massengill, Harrold Donath, MD       OLANZapine (ZYPREXA) tablet 15 mg  15 mg Oral QHS Massengill, Nathan, MD   15 mg at 12/23/21 2055   OLANZapine (ZYPREXA) tablet 5 mg  5 mg Oral BID Massengill, Harrold Donath, MD   5 mg at 12/24/21 0615   OLANZapine zydis (ZYPREXA) disintegrating tablet 5 mg  5 mg Oral Q8H PRN Massengill, Harrold Donath, MD   5 mg at 12/21/21 1448   polyethylene glycol (MIRALAX / GLYCOLAX) packet 17 g  17 g Oral Daily PRN Massengill, Ovid Curd, MD       sodium chloride (OCEAN) 0.65 % nasal spray 1 spray  1 spray Each Nare PRN Rosezetta Schlatter, MD   1 spray at 12/23/21 1603   temazepam (RESTORIL) capsule 30 mg  30 mg Oral QHS PRN Harlow Asa, MD   30 mg at 12/22/21 2047   traZODone (DESYREL) tablet 100 mg  100 mg Oral QHS PRN Harlow Asa, MD   100 mg at 12/23/21 2055   PTA Medications: Medications Prior to Admission  Medication Sig Dispense Refill Last Dose   ABILIFY MAINTENA 400 MG PRSY prefilled syringe Inject 400 mg into the muscle every 28 (twenty-eight) days.      atorvastatin (LIPITOR) 40 MG tablet Take 40 mg by mouth at bedtime.      ibuprofen (ADVIL) 800 MG tablet Take 800 mg by mouth 2 (two) times daily as needed for fever, mild pain or headache.      metFORMIN (GLUCOPHAGE) 500 MG tablet Take 2 tablets (1,000 mg total) by mouth 2 (two) times daily with a meal. 60 tablet 0    naproxen (NAPROSYN) 500 MG tablet Take 1 tablet (500 mg total) by mouth 2 (two)  times daily. 10 tablet 0     Patient Stressors: Medication change or noncompliance    Patient Strengths: Capable of independent living  Motivation for treatment/growth  Supportive family/friends   Treatment Modalities: Medication Management, Group therapy, Case management,  1 to 1 session with clinician, Psychoeducation, Recreational therapy.   Physician Treatment Plan for Primary Diagnosis: Schizoaffective disorder, bipolar type ([place]) Long Term Goal(s): Improvement in symptoms so as ready for discharge   Short Term Goals: Ability to identify changes in lifestyle to reduce recurrence of condition will improve Ability to verbalize feelings will improve Ability to demonstrate self-control will improve Ability to identify and develop effective coping behaviors will improve Compliance with prescribed medications will improve  Medication Management: Evaluate patient's response, side effects, and tolerance of medication regimen.  Therapeutic Interventions: 1 to 1 sessions, Unit Group sessions and Medication administration.  Evaluation of Outcomes: Progressing  Physician Treatment Plan for Secondary Diagnosis: Principal Problem:   Schizoaffective disorder, bipolar type ([place]) Active Problems:   Tobacco use disorder   Diabetes ([place])   Hyperlipidemia  Long Term Goal(s): Improvement in symptoms so as ready for discharge   Short Term Goals: Ability to identify changes in lifestyle to reduce recurrence of condition will improve Ability to verbalize feelings will improve Ability to demonstrate self-control will improve Ability to identify and develop effective coping behaviors will improve Compliance with prescribed medications will improve     Medication Management: Evaluate patient's response, side effects, and tolerance of medication regimen.  Therapeutic Interventions: 1 to 1 sessions, Unit Group sessions and Medication administration.  Evaluation of Outcomes: Progressing   RN  Treatment Plan for Primary Diagnosis: Schizoaffective disorder, bipolar type ([place]) Long Term Goal(s): Knowledge of disease and therapeutic regimen to maintain health will improve  Short Term Goals: Ability to remain free from injury will improve, Ability to verbalize frustration and anger appropriately will improve, Ability to demonstrate self-control, Ability to participate in decision making will improve, Ability to verbalize feelings will improve, Ability to disclose and discuss suicidal ideas, Ability to identify and develop effective coping behaviors will improve, and Compliance with prescribed medications will improve  Medication Management: RN will administer medications as ordered by provider, will assess and evaluate patient's response and provide education to patient for  prescribed medication. RN will report any adverse and/or side effects to prescribing provider.  Therapeutic Interventions: 1 on 1 counseling sessions, Psychoeducation, Medication administration, Evaluate responses to treatment, Monitor vital signs and CBGs as ordered, Perform/monitor CIWA, COWS, AIMS and Fall Risk screenings as ordered, Perform wound care treatments as ordered.  Evaluation of Outcomes: Progressing   LCSW Treatment Plan for Primary Diagnosis: Schizoaffective disorder, bipolar type (HCC) Long Term Goal(s): Safe transition to appropriate next level of care at discharge, Engage patient in therapeutic group addressing interpersonal concerns.  Short Term Goals: Engage patient in aftercare planning with referrals and resources, Increase social support, Increase ability to appropriately verbalize feelings, Increase emotional regulation, Facilitate acceptance of mental health diagnosis and concerns, Facilitate patient progression through stages of change regarding substance use diagnoses and concerns, Identify triggers associated with mental health/substance abuse issues, and Increase skills for wellness and  recovery  Therapeutic Interventions: Assess for all discharge needs, 1 to 1 time with Social worker, Explore available resources and support systems, Assess for adequacy in community support network, Educate family and significant other(s) on suicide prevention, Complete Psychosocial Assessment, Interpersonal group therapy.  Evaluation of Outcomes: Progressing   Progress in Treatment: Attending groups: Yes. Participating in groups: Yes. Taking medication as prescribed: Yes. Toleration medication: Yes. Family/Significant other contact made: Yes, individual(s) contacted:   [name], father,  [phone]  Patient understands diagnosis: No. Discussing patient identified problems/goals with staff: Yes. Medical problems stabilized or resolved: Yes. Denies suicidal/homicidal ideation: Yes. Issues/concerns per patient self-inventory: Yes. Other: none  New problem(s) identified: No, Describe:  none  New Short Term/Long Term Goal(s): Patient to work towards detox, elimination of symptoms of psychosis, medication management for mood stabilization; development of comprehensive mental wellness/sobriety plan.  Patient Goals: No additional goals identified at this time. Patient to continue to work towards original goals identified in initial treatment team meeting. CSW will remain available to patient should they voice additional treatment goals.   Discharge Plan or Barriers: No psychosocial barriers identified at this time, patient to return to place of residence when appropriate for discharge.   Reason for Continuation of Hospitalization: Other; describe psychosis   Estimated Length of Stay: 1-7 days    Scribe for Treatment Team: Almedia Balls 12/24/2021 12:56 PM

## 2021-12-24 NOTE — Group Note (Signed)
Recreation Therapy Group Note   Group Topic:Coping Skills  Group Date: 12/24/2021 Start Time: 1000 End Time: 1035 Facilitators: Caroll Rancher, LRT,CTRS Location: [address] Dayroom   Goal Area(s) Addresses:  Patient will identify positive coping skill techniques. Patient will identify benefits of using positive coping skills post d/c.  Group Description: Mind Map.  Patient was provided a blank template of a diagram with 32 blank boxes in a tiered system, branching from the center (similar to a bubble chart). LRT directed patients to label the middle of the diagram "Coping Skills" and consider 8 different sources in which coping skills would be needed.  Patients and LRT filled in the first 8 boxes together with 8 sources coping skills could be used (anger, stress, isolation, emotions, depression, anxiety, relationships and communication/cooperation.  Patients were to then come up with 3 effective coping techniques to address each identified area in the remaining boxes stemming from a particular source. Pts were encouraged to share ideas with one another and ask for suggestions of peers and Clinical research associate when stuck on a certain category.   Affect/Mood: Appropriate   Participation Level: Minimal   Participation Quality: Independent   Behavior: Distracted   Speech/Thought Process: Distracted   Insight: Poor   Judgement: Poor   Modes of Intervention: Worksheet   Patient Response to Interventions:  Disengaged and Interested    Education Outcome:  Acknowledges education and In group clarification offered    Clinical Observations/Individualized Feedback: Pt was distracted throughout group.  Pt was mostly talking to himself.  Pt started talking about meeting [employer].  Pt was unable to focus on the group topic. Pt did not participate.    Plan: Continue to engage patient in RT group sessions 2-3x/week.   Caroll Rancher, Antonietta Jewel 12/24/2021 12:37 PM

## 2021-12-25 DIAGNOSIS — F250 Schizoaffective disorder, bipolar type: Secondary | ICD-10-CM | POA: Diagnosis not present

## 2021-12-25 LAB — COMPREHENSIVE METABOLIC PANEL
ALT: 15 U/L (ref 0–44)
AST: 15 U/L (ref 15–41)
Albumin: 3.9 g/dL (ref 3.5–5.0)
Alkaline Phosphatase: 63 U/L (ref 38–126)
Anion gap: 10 (ref 5–15)
BUN: 11 mg/dL (ref 6–20)
CO2: 24 mmol/L (ref 22–32)
Calcium: 9.1 mg/dL (ref 8.9–10.3)
Chloride: 106 mmol/L (ref 98–111)
Creatinine, Ser: 0.92 mg/dL (ref 0.61–1.24)
GFR, Estimated: >60 mL/min (ref >60)
Glucose, Bld: 112 mg/dL — ABNORMAL HIGH (ref 70–99)
Potassium: 4.1 mmol/L (ref 3.5–5.1)
Sodium: 140 mmol/L (ref 135–145)
Total Bilirubin: 0.3 mg/dL (ref 0.3–1.2)
Total Protein: 7.3 g/dL (ref 6.5–8.1)

## 2021-12-25 LAB — CBC WITH DIFFERENTIAL/PLATELET
Abs Immature Granulocytes: 0.08 10*3/uL — ABNORMAL HIGH (ref 0.00–0.07)
Basophils Absolute: 0.1 10*3/uL (ref 0.0–0.1)
Basophils Relative: 0 %
Eosinophils Absolute: 0.2 10*3/uL (ref 0.0–0.5)
Eosinophils Relative: 1 %
HCT: 42.2 % (ref 39.0–52.0)
Hemoglobin: 14.1 g/dL (ref 13.0–17.0)
Immature Granulocytes: 1 %
Lymphocytes Relative: 23 %
Lymphs Abs: 3.1 10*3/uL (ref 0.7–4.0)
MCH: 29.4 pg (ref 26.0–34.0)
MCHC: 33.4 g/dL (ref 30.0–36.0)
MCV: 88.1 fL (ref 80.0–100.0)
Monocytes Absolute: 0.8 10*3/uL (ref 0.1–1.0)
Monocytes Relative: 6 %
Neutro Abs: 9.2 10*3/uL — ABNORMAL HIGH (ref 1.7–7.7)
Neutrophils Relative %: 69 %
Platelets: 426 10*3/uL — ABNORMAL HIGH (ref 150–400)
RBC: 4.79 MIL/uL (ref 4.22–5.81)
RDW: 12.4 % (ref 11.5–15.5)
WBC: 13.4 10*3/uL — ABNORMAL HIGH (ref 4.0–10.5)
nRBC: 0 % (ref 0.0–0.2)

## 2021-12-25 LAB — BRAIN NATRIURETIC PEPTIDE: B Natriuretic Peptide: 24.2 pg/mL (ref 0.0–100.0)

## 2021-12-25 LAB — TROPONIN I (HIGH SENSITIVITY): Troponin I (High Sensitivity): 3 ng/L (ref <18)

## 2021-12-25 LAB — C-REACTIVE PROTEIN: CRP: 0.7 mg/dL (ref <1.0)

## 2021-12-25 LAB — SEDIMENTATION RATE: Sed Rate: 15 mm/hr (ref 0–16)

## 2021-12-25 MED ORDER — WHITE PETROLATUM EX OINT
TOPICAL_OINTMENT | CUTANEOUS | Status: AC
  Administered 2021-12-25: 1
  Filled 2021-12-25: qty 5

## 2021-12-25 MED ORDER — WHITE PETROLATUM EX OINT
TOPICAL_OINTMENT | CUTANEOUS | Status: AC
  Filled 2021-12-25: qty 5

## 2021-12-25 NOTE — Progress Notes (Signed)
   12/25/21 2100  Psych Admission Type (Psych Patients Only)  Admission Status Involuntary  Psychosocial Assessment  Patient Complaints Hyperactivity;Restlessness  Eye Contact Brief  Facial Expression Animated  Affect Preoccupied  Speech Pressured  Interaction Assertive  Motor Activity Pacing  Appearance/Hygiene Unremarkable  Behavior Characteristics Cooperative  Mood Suspicious;Preoccupied;Pleasant  Aggressive Behavior  Effect No apparent injury  Thought Process  Coherency Disorganized;Flight of ideas;Loose associations  Content Delusions;Confabulation  Delusions Grandeur  Perception Hallucinations  Hallucination Auditory;Visual  Judgment Limited  Confusion None  Danger to Self  Current suicidal ideation? Denies  Danger to Others  Danger to Others None reported or observed

## 2021-12-25 NOTE — Progress Notes (Signed)
Adult Psychoeducational Group Note  Date:  12/25/2021 Time:  10:16 AM  Group Topic/Focus:  Goals Group:   The focus of this group is to help patients establish daily goals to achieve during treatment and discuss how the patient can incorporate goal setting into their daily lives to aide in recovery.  Participation Level:  Active  Participation Quality:  Appropriate  Affect:  Appropriate  Cognitive:  Appropriate  Insight: Appropriate  Engagement in Group:  Engaged  Modes of Intervention:  Discussion  Additional Comments:  Patient attended morning orientation/goal setting group and said that his goal for today is to be discharged, but he hasn't discussed his discharge plan. Tech suggested that speak to his Care-team about his discharge plan when they come to speak to him.   [name] W [name] 12/25/2021, 10:16 AM

## 2021-12-25 NOTE — Progress Notes (Signed)
[facility] MD Progress Note  12/25/2021 7:32 AM [name]  MRN:  [id number] Subjective:  [name] is a 46 year old male with a reported past psychiatric history of schizophrenia-paranoid, bipolar disorder housing insecurity who presented to [facility] ED on 8/3 under IVC for psychosis and bizarre behaviors.    Yesterday's recommendations per psychiatry team: -Discontinue Zyprexa 08/31/13 mg, as patient would likely benefit more from an FGA -Start Haldol 5 mg every 12 hours - Patient currently on Abilify Maintena 400 mg LAI, last given 8/16 - Continue clozapine uptitration for schizophrenia 75 mg tonight, to continue to increase by 12.5 mg daily.            -Weekly Labs to be monitored 8/29: CBC, CRP, ESR, BNP, troponin, CMP, and EKG -Continue Depakote 500 mg bid - for mood stabilization -Trough level to be obtained 12/25/21 -Consider beta-blocker to manage tachycardia and anxiety if HR remains persistently elevated   On evaluation today: Patient is again disorganized and tangential on assessment; his day-to-day presentation is inconsistent with this Probation officer.  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  He reports he is mood is stable and he denies acute concerns or complaints today.  Reports he slept well.  Appetite is good.  Denies SI, HI, and AVH, but says that songs and movie lines go through his head. Denies side effects to current medications.    Daily review of symptoms, specific for clozapine: Malaise/Sedation: Denies Chest pain: Denies Shortness of breath: Denies Exertional capacity: Denies Tachycardia: Denies Cough: Denies Sore Throat: Denies Fever: Denies Orthostatic hypotension (dizziness with standing): Denies Hypersalivation: Denies Constipation: Reports last bowel movement was yesterday Symptoms of GERD: Denies Nausea: Denies Nocturnal enuresis: Denies   Principal Problem:  Schizoaffective disorder, bipolar type ([place]) Diagnosis: Principal Problem:   Schizoaffective disorder, bipolar type ([place]) Active Problems:   Tobacco use disorder   Diabetes ([place])   Hyperlipidemia   Total Time spent with patient: 20 minutes  Past Psychiatric History: Previous Psych Diagnoses: Schizophrenia, Schizoaffective disorder-bipolar type Prior inpatient treatment: Multiple, [facility] 04[facility]2018 Prior outpatient treatment: [facility] History of suicide: History of homicide: Denies Psychiatric medication history: Previously on 6/14 Zyprexa 5 mg qAM and 30 mg qHS; Prolixin 10 mg in 05/2020, Invega Sustenna, PO Invega, and Haldol (02/2020), Neurontin.  Currently taking Abilify Maintena 400 mg and Zyprexa 10 mg BID Psychiatric medication compliance history: Reports compliance, and picking up medications appropriately, per pharmacy records Neuromodulation history: Denies Current Psychiatrist:[facility] services via day [name]  Past Medical History:  Past Medical History:  Diagnosis Date   Bipolar disorder ([place])    Bronchitis    Hyperlipidemia    Paranoid schizophrenia ([place])    Schizophrenia ([place])     Past Surgical History:  Procedure Laterality Date   TESTICLE IMPLANTATION TO THIGH     TESTICLE SURGERY     Family History:  Family History  Problem Relation Age of Onset   Schizophrenia Neg Hx    Family Psychiatric  History: See H&P Social History:  Social History   Substance and Sexual Activity  Alcohol Use Not Currently   Comment: weekly     Social History   Substance and Sexual Activity  Drug Use Yes   Types: Marijuana   Comment: smokes hemp    Social History   Socioeconomic History   Marital status: Single    Spouse name: Not on file   Number of children:  Not on file   Years of education: 12 years   Highest education level: 12th grade  Occupational History   Occupation: On disability  Tobacco Use   Smoking status: Every Day    Packs/day: 1.00    Types:  Cigarettes   Smokeless tobacco: Never  Vaping Use   Vaping Use: Never used  Substance and Sexual Activity   Alcohol use: Not Currently    Comment: weekly   Drug use: Yes    Types: Marijuana    Comment: smokes hemp   Sexual activity: Yes    Birth control/protection: Condom  Other Topics Concern   Not on file  Social History Narrative   Pt lives alone in apartment complex for mentally ill   Social Determinants of Health   Financial Resource Strain: Not on file  Food Insecurity: Not on file  Transportation Needs: Not on file  Physical Activity: Not on file  Stress: Not on file  Social Connections: Not on file   Additional Social History:       Sleep: Fair  Appetite:  Good  Current Medications: Current Facility-Administered Medications  Medication Dose Route Frequency Provider Last Rate Last Admin   alum & mag hydroxide-simeth (MAALOX/MYLANTA) 200-200-20 MG/5ML suspension 30 mL  30 mL Oral Q4H PRN Bobbitt, Shalon E, NP   30 mL at 12/20/21 1135   atorvastatin (LIPITOR) tablet 40 mg  40 mg Oral QHS Bobbitt, Shalon E, NP   40 mg at 12/24/21 2035   benztropine (COGENTIN) tablet 0.5 mg  0.5 mg Oral Q12H Massengill, Ovid Curd, MD   0.5 mg at 12/24/21 2035   benztropine (COGENTIN) tablet 1 mg  1 mg Oral BID PRN Harlow Asa, MD   1 mg at 12/19/21 8338   Or   benztropine mesylate (COGENTIN) injection 1 mg  1 mg Intramuscular BID PRN Harlow Asa, MD       haloperidol (HALDOL) tablet 5 mg  5 mg Oral Q8H PRN Massengill, Ovid Curd, MD       And   benztropine (COGENTIN) tablet 1 mg  1 mg Oral Q8H PRN Massengill, Nathan, MD       And   LORazepam (ATIVAN) tablet 1 mg  1 mg Oral Q8H PRN Massengill, Nathan, MD       haloperidol lactate (HALDOL) injection 5 mg  5 mg Intramuscular Q8H PRN Massengill, Nathan, MD       And   benztropine mesylate (COGENTIN) injection 1 mg  1 mg Intramuscular Q8H PRN Massengill, Nathan, MD       And   LORazepam (ATIVAN) injection 1 mg  1 mg Intramuscular  Q8H PRN Massengill, Nathan, MD       bismuth subsalicylate (PEPTO BISMOL) chewable tablet 524 mg  524 mg Oral Q4H PRN Nyoka Cowden, Terri L, RPH   524 mg at 12/22/21 0739   cloZAPine (CLOZARIL) tablet 87.5 mg  87.5 mg Oral QHS Rosezetta Schlatter, MD       Followed by   Derrill Memo ON 12/26/2021] cloZAPine (CLOZARIL) tablet 100 mg  100 mg Oral QHS Rosezetta Schlatter, MD       divalproex (DEPAKOTE) DR tablet 500 mg  500 mg Oral Q12H Massengill, Nathan, MD   500 mg at 12/24/21 2035   docusate sodium (COLACE) capsule 100 mg  100 mg Oral Daily PRN France Ravens, MD       empagliflozin (JARDIANCE) tablet 10 mg  10 mg Oral Daily Rosezetta Schlatter, MD   10 mg at 12/24/21 [phone]  haloperidol (HALDOL) tablet 5 mg  5 mg Oral Q12H Massengill, Nathan, MD   5 mg at 12/24/21 2035   hydrOXYzine (ATARAX) tablet 25 mg  25 mg Oral TID PRN Bobbitt, Shalon E, NP   25 mg at 12/24/21 2036   ibuprofen (ADVIL) tablet 800 mg  800 mg Oral BID PRN Bobbitt, Shalon E, NP   800 mg at 12/24/21 1123   magnesium hydroxide (MILK OF MAGNESIA) suspension 30 mL  30 mL Oral Daily PRN Bobbitt, Shalon E, NP   30 mL at 12/10/21 2029   melatonin tablet 5 mg  5 mg Oral QHS PRN Massengill, Ovid Curd, MD   5 mg at 12/24/21 2035   metFORMIN (GLUCOPHAGE) tablet 1,000 mg  1,000 mg Oral BID WC Bobbitt, Shalon E, NP   1,000 mg at 12/24/21 1707   Muscle Rub CREA   Topical PRN Rosezetta Schlatter, MD       nicotine (NICODERM CQ - dosed in mg/24 hours) patch 21 mg  21 mg Transdermal Daily Rosezetta Schlatter, MD   21 mg at 12/24/21 0806   polyethylene glycol (MIRALAX / GLYCOLAX) packet 17 g  17 g Oral Daily PRN Massengill, Ovid Curd, MD       sodium chloride (OCEAN) 0.65 % nasal spray 1 spray  1 spray Each Nare PRN Rosezetta Schlatter, MD   1 spray at 12/23/21 1603   temazepam (RESTORIL) capsule 30 mg  30 mg Oral QHS PRN Harlow Asa, MD   30 mg at 12/24/21 2036   traZODone (DESYREL) tablet 100 mg  100 mg Oral QHS PRN Harlow Asa, MD   100 mg at 12/24/21 2035    Lab Results:  No  results found for this or any previous visit (from the past 19 hour(s)).   Blood Alcohol level:  Lab Results  Component Value Date   ETH <10 11/28/2021   ETH <10 77/82/4235    Metabolic Disorder Labs: Lab Results  Component Value Date   HGBA1C 6.9 (H) 11/17/2021   MPG 151.33 11/17/2021   MPG 111.15 02/03/2020   Lab Results  Component Value Date   PROLACTIN 8.1 06/01/2019   Lab Results  Component Value Date   CHOL 130 12/01/2021   TRIG 172 (H) 12/01/2021   HDL 28 (L) 12/01/2021   CHOLHDL 4.6 12/01/2021   VLDL 34 12/01/2021   LDLCALC 68 12/01/2021   LDLCALC 97 02/03/2020    Physical Findings:  Musculoskeletal: Strength & Muscle Tone: within normal limits Gait & Station: normal Patient leans: N/A  Psychiatric Specialty Exam:  Presentation  General Appearance: Appropriate for Environment; Casual   Eye Contact:Good   Speech:Clear and Coherent; Normal Rate   Speech Volume:Normal   Handedness:Right    Mood and Affect  Mood:Euthymic   Affect:Blunt    Thought Process  Thought Processes:Disorganized   Descriptions of Associations:Tangential   Orientation:Full (Time, Place and Person)   Thought Content:Scattered; Tangential; Illogical   History of Schizophrenia/Schizoaffective disorder:Yes   Duration of Psychotic Symptoms:Greater than six months   Hallucinations:No data recorded Denies AVH.   Ideas of Reference:None Has delusional thought content.  Suicidal Thoughts:No data recorded Denies   Homicidal Thoughts:No data recorded Denies    Sensorium  Memory:Immediate Fair; Recent Fair   Judgment:Impaired   Insight:Poor; Shallow    Executive Functions  Concentration: Poor   Attention Span: Poor   Recall:Fair   Fund of Knowledge:Fair   Language:Fair    Psychomotor Activity  Psychomotor Activity:No data recorded Normal    Assets  Assets:Desire for Improvement; Resilience; Social Support    Sleep   Sleep:Sleep: Better      Physical Exam: Physical Exam Vitals reviewed.  Constitutional:      General: He is not in acute distress.    Appearance: He is normal weight. He is not toxic-appearing.  HENT:     Head: Normocephalic and atraumatic.     Mouth/Throat:     Mouth: Mucous membranes are moist.     Pharynx: Oropharynx is clear.  Pulmonary:     Effort: Pulmonary effort is normal. No respiratory distress.  Skin:    General: Skin is warm and dry.  Neurological:     Mental Status: He is alert.     Motor: No weakness.     Gait: Gait normal.  Psychiatric:        Mood and Affect: Mood normal.    Review of Systems  Constitutional:  Negative for chills and fever.  Cardiovascular:  Negative for chest pain and palpitations.  Gastrointestinal: Negative.   Genitourinary: Negative.   Musculoskeletal: Negative.   Neurological:  Negative for dizziness, tingling, tremors and headaches.  Psychiatric/Behavioral:  Negative for depression, hallucinations, memory loss, substance abuse and suicidal ideas. The patient is not nervous/anxious and does not have insomnia.    Blood pressure 121/76, pulse 100, temperature 98 F (36.7 C), temperature source Oral, resp. rate 20, height '6\' 1"'  (1.854 m), weight 117.9 kg, SpO2 99 %. Body mass index is 34.3 kg/m.   Treatment Plan Summary: ASSESSMENT: Principal Problem:   Schizoaffective disorder, bipolar type ([place]) Active Problems:   Tobacco use disorder   Patient appears to have inconsistent improvements on current regimen, so in addition to Clozaril will need to add on a first generation antipsychotic.   PLAN: Safety and Monitoring:             -- Involuntary admission to inpatient psychiatric unit for safety, stabilization and treatment             -- Daily contact with patient to assess and evaluate symptoms and progress in treatment             -- Patient's case to be discussed in multi-disciplinary team meeting             --  Observation Level : q15 minute checks             -- Vital signs:  q12 hours             -- Precautions: suicide, elopement, and assault   2. Psychiatric Diagnoses and Treatment:  # Schizoaffective disorder-bipolar type -Discontinue Zyprexa 08/31/13 mg, as patient would likely benefit more from an FGA -Continue Haldol 5 mg every 12 hours - Patient currently on Abilify Maintena 400 mg LAI, last given 8/16 - Continue clozapine uptitration for schizophrenia 87.5 mg tonight, to continue to increase by 12.5 mg daily.  -Weekly Labs to be monitored 8/29: CRP, ESR, BNP, troponin, CMP, and EKG all WNL, CBC w/ Plt 426, ANC 9200 -Continue Depakote 500 mg bid - for mood stabilization  -Trough level to add on to previous labs or AM collection -Consider beta-blocker to manage tachycardia and anxiety if HR remains persistently elevated   -- Metabolic profile and EKG monitoring obtained while on an atypical antipsychotic  BMI: 34.3 Lipid Panel: TG 172 HbgA1c: 6.9% QTc: 413             -- Encouraged patient to participate in unit milieu and in scheduled group therapies  3. Medical Issues Being Addressed: #Nicotine use disorder - Nicotine patch 21 mg daily   #T2DM Home medication Synjardi 01/999 mg BID.  Due to the fact that patient's CBGs have been largely WNL since admission and patient on home regimen and diabetes well-controlled (A1c 6.9%), so minimal concern for decompensation, we will discontinue a.m. CBG checks. - Continue metformin 1000 mg twice daily with meals - Continue Jardiance 10 mg every morning   #Hyperlipidemia - Continue home atorvastatin 40 mg daily   4. Discharge Planning:               -- Social work and case management to assist with discharge planning and identification of hospital follow-up needs prior to discharge             -- Estimated LOS: 10-14 additional days while titrating Clozaril             -- Discharge Concerns: Need to establish a safety plan; Medication  compliance and effectiveness             -- Discharge Goals: Return home with outpatient referrals for mental health follow-up including medication management/psychotherapy   Rosezetta Schlatter, MD 12/25/2021, 7:32 AM

## 2021-12-25 NOTE — Progress Notes (Signed)
   12/25/21 1100  Psych Admission Type (Psych Patients Only)  Admission Status Involuntary  Psychosocial Assessment  Patient Complaints Restlessness  Eye Contact Brief  Facial Expression Animated  Affect Preoccupied  Speech Tangential  Interaction Assertive  Motor Activity Pacing  Appearance/Hygiene In scrubs  Behavior Characteristics Cooperative  Mood Preoccupied  Thought Process  Coherency Disorganized;Flight of ideas  Content Delusions  Delusions Grandeur  Perception Hallucinations  Hallucination Auditory;Visual  Judgment Limited  Confusion None  Danger to Self  Current suicidal ideation? Denies  Danger to Others  Danger to Others None reported or observed

## 2021-12-25 NOTE — Progress Notes (Signed)
   12/25/21 0500  Sleep  Number of Hours 7.5

## 2021-12-25 NOTE — Progress Notes (Signed)
Adult Psychoeducational Group Note  Date:  12/25/2021 Time:  9:08 PM  Group Topic/Focus:  Wrap-Up Group:   The focus of this group is to help patients review their daily goal of treatment and discuss progress on daily workbooks.  Participation Level:  Active  Participation Quality:  Appropriate  Affect:  Anxious  Cognitive:  Disorganized and Confused  Insight: Appropriate  Engagement in Group:  Limited  Modes of Intervention:  Discussion  Additional Comments:  Pt stated his goal for today was to focus on his treatment plan. Pt stated he accomplished his goal today. Pt stated he talked with his doctor and social worker about his care today. Pt rated his overall day a 10.  Pt stated he was able to contact his aunt today which improved his overall day. Pt stated his mother coming for visitation tonight improved his overall day. Pt stated he felt better about himself today. Pt stated he was able to attend all meals. Pt stated he took all medications provided today. Pt stated he attend all groups held today. Pt stated his appetite was pretty good today. Pt rated sleep last night was pretty good. Pt stated the goal tonight was to get some rest. Pt stated he had no physical pain tonight. Pt deny visual hallucinations but admitted to dealing with auditory issues tonight. Pt nurse was updated on the situation. Pt denies thoughts of harming himself or others. Pt stated he would alert staff if anything changed  Felipa Furnace 12/25/2021, 9:08 PM

## 2021-12-26 DIAGNOSIS — F250 Schizoaffective disorder, bipolar type: Secondary | ICD-10-CM

## 2021-12-26 LAB — VALPROIC ACID LEVEL: Valproic Acid Lvl: 40 ug/mL — ABNORMAL LOW (ref 50.0–100.0)

## 2021-12-26 MED ORDER — CLOZAPINE 25 MG PO TABS
125.0000 mg | ORAL_TABLET | Freq: Every day | ORAL | Status: AC
  Administered 2021-12-28: 125 mg via ORAL
  Filled 2021-12-26: qty 1

## 2021-12-26 MED ORDER — CLOZAPINE 25 MG PO TABS: 112.5000 mg | ORAL_TABLET | Freq: Every day | ORAL | Status: DC

## 2021-12-26 MED ORDER — PANTOPRAZOLE SODIUM 40 MG PO TBEC
40.0000 mg | DELAYED_RELEASE_TABLET | Freq: Once | ORAL | Status: AC
  Administered 2021-12-26: 40 mg via ORAL
  Filled 2021-12-26: qty 1

## 2021-12-26 MED ORDER — WHITE PETROLATUM EX OINT
TOPICAL_OINTMENT | CUTANEOUS | Status: AC
  Administered 2021-12-26: 1
  Filled 2021-12-26: qty 5

## 2021-12-26 MED ORDER — CLOZAPINE 25 MG PO TABS
112.5000 mg | ORAL_TABLET | Freq: Every day | ORAL | Status: AC
  Administered 2021-12-27: 112.5 mg via ORAL
  Filled 2021-12-26: qty 1

## 2021-12-26 MED ORDER — PANTOPRAZOLE SODIUM 40 MG PO TBEC
40.0000 mg | DELAYED_RELEASE_TABLET | Freq: Every day | ORAL | Status: DC
  Administered 2021-12-27 – 2022-01-09 (×14): 40 mg via ORAL
  Filled 2021-12-26 (×16): qty 1

## 2021-12-26 MED ORDER — POLYETHYLENE GLYCOL 3350 17 G PO PACK: 17.0000 g | PACK | Freq: Every day | ORAL | Status: DC | PRN

## 2021-12-26 NOTE — Progress Notes (Signed)
Adult Psychoeducational Group Note  Date:  12/26/2021 Time:  8:42 PM  Group Topic/Focus:  Wrap-Up Group:   The focus of this group is to help patients review their daily goal of treatment and discuss progress on daily workbooks.  Participation Level:  Active  Participation Quality:  Appropriate  Affect:  Appropriate  Cognitive:  Appropriate  Insight: Appropriate  Engagement in Group:  Engaged  Modes of Intervention:  Discussion  Additional Comments:   Pt attended and participated in the Wrap-Up group. Pt day/mood 10/10. Pt made some progress toward obtaining discharge information. Pt reports speaking with the doctor about leaving treatment. Pt uses prayer, walking and talking to peers to help him cope.  [name] 12/26/2021, 8:42 PM

## 2021-12-26 NOTE — Progress Notes (Signed)
   12/26/21 2015  Psych Admission Type (Psych Patients Only)  Admission Status Involuntary  Psychosocial Assessment  Patient Complaints Hyperactivity;Restlessness  Eye Contact Brief  Facial Expression Animated  Affect Preoccupied  Speech Pressured  Interaction Assertive  Motor Activity Pacing  Appearance/Hygiene Unremarkable  Behavior Characteristics Cooperative  Mood Suspicious;Preoccupied;Pleasant  Aggressive Behavior  Effect No apparent injury  Thought Process  Coherency Disorganized;Flight of ideas;Loose associations  Content Delusions;Confabulation  Delusions Grandeur  Perception Hallucinations  Hallucination Auditory;Visual  Judgment Limited  Confusion None  Danger to Self  Current suicidal ideation? Denies  Danger to Others  Danger to Others None reported or observed

## 2021-12-26 NOTE — Progress Notes (Signed)
   12/26/21 0500  Sleep  Number of Hours 7.25

## 2021-12-26 NOTE — Group Note (Signed)
Recreation Therapy Group Note   Group Topic:Team Building  Group Date: 12/26/2021 Start Time: 1000 End Time: 1045 Facilitators: Caroll Rancher, LRT,CTRS Location: 500 Hall Dayroom   Goal Area(s) Addresses:  Patient will effectively work with peer towards shared goal.  Patient will identify skills used to make activity successful.  Patient will identify how skills used during activity can be used to reach post d/c goals.   Group Description: Landing Pad. In teams of 3-5, patients were given 12 plastic drinking straws and an equal length of masking tape. Using the materials provided, patients were asked to build a landing pad to catch a golf ball dropped from approximately 5 feet in the air. All materials were required to be used by the team in their design. LRT facilitated post-activity discussion.   Affect/Mood: Appropriate   Participation Level: Minimal   Participation Quality: Moderate Cues   Behavior: Hallucinating   Speech/Thought Process: Distracted   Insight: Lacking   Judgement: Lacking    Modes of Intervention: STEM Activity   Patient Response to Interventions:  Disengaged   Education Outcome:  Acknowledges education and In group clarification offered    Clinical Observations/Individualized Feedback: Pt attempted to participate in group.  Pt was talking to himself and inappropriate laughter.  Pt unable to focus even with redirection.  Pt left group and did not return.     Plan: Continue to engage patient in RT group sessions 2-3x/week.   Caroll Rancher, LRT,CTRS 12/26/2021 1:27 PM

## 2021-12-26 NOTE — Group Note (Signed)
  [facility] LCSW Group Therapy Note  Date/Time:  12/26/2021 1300-1400  Type of Therapy and Topic:  Group Therapy:  Self-Care Wheel  Participation Level:  Active   Description of Group This process group involved patients discussing the importance of self-care in different areas of life (professional, personal, emotional, psychological, spiritual, and physical) in order to achieve healthy life balance.  The group talked about what self-care in each of those areas would constitute and then specifically listed how they want to provide themselves with improved self-care.  Therapeutic Goals Patient will learn how to break self-care down into various areas of life Patient will participate in generating ideas about healthy self-care options in each category Patients will be supportive of one another and receive support from others Patient will identify one healthy self-care activity to add to his/her life   Summary of Patient Progress:  The patient expressed that one thing he can share about himself is that he enjoys spending time at amusement parks.  this helped him to see how people are all connected in a variety of ways. During the discussion about self-care, he shared ideas about "doing something different".  He/She was able to identify ways in which self-care in different areas would be helpful.  Patient's insight was appropriate.  Therapeutic Modalities Processing Psychoeducation   Charna Busman, LCSW 12/26/2021 2:02 PM

## 2021-12-26 NOTE — Progress Notes (Signed)
[facility] [name] Progress Note  12/26/2021 2:45 PM [name]  MRN:  [id number]   Reason for admission:  [name] is a 46 year old male with a reported past psychiatric history of schizophrenia-paranoid, bipolar disorder housing insecurity who presented to [facility] ED on 8/3 under IVC for psychosis and bizarre behaviors.    Yesterday's recommendations per psychiatry team: -Discontinue Zyprexa 08/31/13 mg, as patient would likely benefit more from an FGA - Continue Haldol 5 mg every 12 hours - Patient is currently on Abilify Maintena 400 mg LAI, last given 12/12/21 - Continue clozapine uptitration for schizophrenia 100 mg tonight, to continue to increase by 12.5 mg daily.            -Weekly Labs to be monitored 12/25/21: CBC: wbc - 13.4, CRP, ESR, BNP, troponin, CMP, and EKG - Continue Depakote 500 mg bid - for mood stabilization -Trough level to be obtained 12/25/21, result pending. -Consider beta-blocker to manage tachycardia and anxiety if HR remains persistently elevated   On evaluation today: There has not been much changes noted on this patient behavior or mannerism. He remains disorganized, delusional & tangential on assessment. His day-to-day presentation remains inconsistent.  He reports today that he is doing well, ready to be discharged so he can go home as he has some bills to pay. He says he also has to go check his bank account as he feels people are probably messing with his money. 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 He says this is not a new problem as this has been going since 1998. He says he did hire a Games developer who later confirmed to him that people were messing with his music. Barbara says his mother & father never believed him when he told them what was happening with his music. He says his mother must have been possessed by some spirit for her to not believe her own son. Although quite disorganized & delusional, [name] currently  denies any new issues or concerns. He reports his mood as stable. He denies any acute concerns or complaints today.  Reports he slept well last night.  Appetite is good.  Denies SI, HI, and AVH. Denies side effects to his current medications. Will continue current pan of care as already in progress. Reviewed current lab results, see treatment plan below. Initiated Protonix 40 mg for acid reflux.  Daily review of symptoms, specific for clozapine: Malaise/Sedation: Denies Chest pain: Denies Shortness of breath: Denies Exertional capacity: Denies Tachycardia: Denies Cough: Denies Sore Throat: Denies Fever: Denies Orthostatic hypotension (dizziness with standing): Denies Hypersalivation: Denies Constipation: Reports last bowel movement was yesterday Symptoms of GERD: Denies Nausea: Denies Nocturnal enuresis: Denies   Principal Problem: Schizoaffective disorder, bipolar type ([place]) Diagnosis: Principal Problem:   Schizoaffective disorder, bipolar type ([place]) Active Problems:   Tobacco use disorder   Diabetes ([place])   Hyperlipidemia  Total Time spent with patient: 20 minutes  Past Psychiatric History:  Schizophrenia, Schizoaffective disorder-bipolar type Prior inpatient treatment: Multiple, [name] 04/2018 Prior outpatient treatment: [facility] History of suicide: History of homicide: Denies Psychiatric medication history: Previously on 6/14 Zyprexa 5 mg qAM and 30 mg qHS; Prolixin 10 mg in 05/2020, Invega Sustenna, PO Invega, and Haldol (02/2020), Neurontin.  Currently taking Abilify Maintena 400 mg and Zyprexa 10 mg BID Psychiatric medication compliance history: Reports compliance, and picking up medications appropriately, per pharmacy records Neuromodulation history: Denies Current Psychiatrist:[facility] services via day [facility]  Past Medical  History:  Past Medical History:  Diagnosis Date   Bipolar disorder ([place])    Bronchitis    Hyperlipidemia    Paranoid schizophrenia ([place])     Schizophrenia ([place])     Past Surgical History:  Procedure Laterality Date   TESTICLE IMPLANTATION TO THIGH     TESTICLE SURGERY     Family History:  Family History  Problem Relation Age of Onset   Schizophrenia Neg Hx    Family Psychiatric  History: See H&P.   Social History:  Social History   Substance and Sexual Activity  Alcohol Use Not Currently   Comment: weekly     Social History   Substance and Sexual Activity  Drug Use Yes   Types: Marijuana   Comment: smokes hemp    Social History   Socioeconomic History   Marital status: Single    Spouse name: Not on file   Number of children: Not on file   Years of education: 12 years   Highest education level: 12th grade  Occupational History   Occupation: On disability  Tobacco Use   Smoking status: Every Day    Packs/day: 1.00    Types: Cigarettes   Smokeless tobacco: Never  Vaping Use   Vaping Use: Never used  Substance and Sexual Activity   Alcohol use: Not Currently    Comment: weekly   Drug use: Yes    Types: Marijuana    Comment: smokes hemp   Sexual activity: Yes    Birth control/protection: Condom  Other Topics Concern   Not on file  Social History Narrative   Pt lives alone in apartment complex for mentally ill   Social Determinants of Health   Financial Resource Strain: Not on file  Food Insecurity: Not on file  Transportation Needs: Not on file  Physical Activity: Not on file  Stress: Not on file  Social Connections: Not on file   Additional Social History:   Sleep: Fair  Appetite:  Good  Current Medications: Current Facility-Administered Medications  Medication Dose Route Frequency Provider Last Rate Last Admin   alum & mag hydroxide-simeth (MAALOX/MYLANTA) 200-200-20 MG/5ML suspension 30 mL  30 mL Oral Q4H PRN [name], James E, NP   30 mL at 12/20/21 1135   atorvastatin (LIPITOR) tablet 40 mg  40 mg Oral QHS [name], James E, NP   40 mg at 12/25/21 2041   benztropine (COGENTIN)  tablet 0.5 mg  0.5 mg Oral Q12H [name], James Curd, [name]   0.5 mg at 12/26/21 0813   haloperidol (HALDOL) tablet 5 mg  5 mg Oral Q8H PRN [name], James Curd, [name]       And   benztropine (COGENTIN) tablet 1 mg  1 mg Oral Q8H PRN [name], Nathan, [name]       And   LORazepam (ATIVAN) tablet 1 mg  1 mg Oral Q8H PRN [name], Nathan, [name]       haloperidol lactate (HALDOL) injection 5 mg  5 mg Intramuscular Q8H PRN [name], Nathan, [name]       And   benztropine mesylate (COGENTIN) injection 1 mg  1 mg Intramuscular Q8H PRN [name], Nathan, [name]       And   LORazepam (ATIVAN) injection 1 mg  1 mg Intramuscular Q8H PRN [name], Nathan, [name]       cloZAPine (CLOZARIL) tablet 100 mg  100 mg Oral QHS James Schlatter, [name]       divalproex (DEPAKOTE) DR tablet 500 mg  500 mg Oral Q12H James Limbo, [name]  500 mg at 12/26/21 7858   docusate sodium (COLACE) capsule 100 mg  100 mg Oral Daily PRN James Ravens, [name]       empagliflozin (JARDIANCE) tablet 10 mg  10 mg Oral Daily James Schlatter, [name]   10 mg at 12/26/21 8502   haloperidol (HALDOL) tablet 5 mg  5 mg Oral Q12H [name], James Curd, [name]   5 mg at 12/26/21 7741   hydrOXYzine (ATARAX) tablet 25 mg  25 mg Oral TID PRN [name], James Diener E, NP   25 mg at 12/25/21 2041   ibuprofen (ADVIL) tablet 800 mg  800 mg Oral BID PRN James Asa, [name]   800 mg at 12/25/21 1644   magnesium hydroxide (MILK OF MAGNESIA) suspension 30 mL  30 mL Oral Daily PRN [name], James E, NP   30 mL at 12/10/21 2029   melatonin tablet 5 mg  5 mg Oral QHS PRN [name], James Curd, [name]   5 mg at 12/25/21 2040   metFORMIN (GLUCOPHAGE) tablet 1,000 mg  1,000 mg Oral BID WC [name], James E, NP   1,000 mg at 12/26/21 2878   Muscle Rub CREA   Topical PRN James Schlatter, [name]   1 Application at 67/67/20 0953   nicotine (NICODERM CQ - dosed in mg/24 hours) patch 21 mg  21 mg Transdermal Daily James Schlatter, [name]   21 mg at 12/26/21 0815   polyethylene glycol (MIRALAX / GLYCOLAX) packet 17 g  17 g  Oral Daily PRN [name], James Curd, [name]       sodium chloride (OCEAN) 0.65 % nasal spray 1 spray  1 spray Each Nare PRN James Schlatter, [name]   1 spray at 12/25/21 0834   temazepam (RESTORIL) capsule 30 mg  30 mg Oral QHS PRN James Asa, [name]   30 mg at 12/25/21 2041   traZODone (DESYREL) tablet 100 mg  100 mg Oral QHS PRN James Asa, [name]   100 mg at 12/25/21 2040   white petrolatum (VASELINE) gel            Lab Results:  Results for orders placed or performed during the hospital encounter of 12/07/21 (from the past 48 hour(s))  Brain natriuretic peptide     Status: None   Collection Time: 12/25/21  6:31 AM  Result Value Ref Range   B Natriuretic Peptide 24.2 0.0 - 100.0 pg/mL    Comment: Performed at [facility] [place] [address], [place], [place] [address]  Sedimentation rate     Status: None   Collection Time: 12/25/21  6:31 AM  Result Value Ref Range   Sed Rate 15 0 - 16 mm/hr    Comment: Performed at [facility], [place] [address], [place], [place] [address]  Comprehensive metabolic panel     Status: Abnormal   Collection Time: 12/25/21  6:31 AM  Result Value Ref Range   Sodium 140 135 - 145 mmol/L   Potassium 4.1 3.5 - 5.1 mmol/L   Chloride 106 98 - 111 mmol/L   CO2 24 22 - 32 mmol/L   Glucose, Bld 112 (H) 70 - 99 mg/dL    Comment: Glucose reference range applies only to samples taken after fasting for at least 8 hours.   BUN 11 6 - 20 mg/dL   Creatinine, Ser 0.92 0.61 - 1.24 mg/dL   Calcium 9.1 8.9 - 10.3 mg/dL   Total Protein 7.3 6.5 - 8.1 g/dL   Albumin 3.9 3.5 - 5.0 g/dL  AST 15 15 - 41 U/L   ALT 15 0 - 44 U/L   Alkaline Phosphatase 63 38 - 126 U/L   Total Bilirubin 0.3 0.3 - 1.2 mg/dL   GFR, Estimated >60 >60 mL/min    Comment: (NOTE) Calculated using the CKD-EPI Creatinine Equation (2021)    Anion gap 10 5 - 15    Comment: Performed at [name], [place] [address] [place], [place] [address]  CBC with  Differential/Platelet     Status: Abnormal   Collection Time: 12/25/21  6:31 AM  Result Value Ref Range   WBC 13.4 (H) 4.0 - 10.5 K/uL   RBC 4.79 4.22 - 5.81 MIL/uL   Hemoglobin 14.1 13.0 - 17.0 g/dL   HCT 42.2 39.0 - 52.0 %   MCV 88.1 80.0 - 100.0 fL   MCH 29.4 26.0 - 34.0 pg   MCHC 33.4 30.0 - 36.0 g/dL   RDW 12.4 11.5 - 15.5 %   Platelets 426 (H) 150 - 400 K/uL   nRBC 0.0 0.0 - 0.2 %   Neutrophils Relative % 69 %   Neutro Abs 9.2 (H) 1.7 - 7.7 K/uL   Lymphocytes Relative 23 %   Lymphs Abs 3.1 0.7 - 4.0 K/uL   Monocytes Relative 6 %   Monocytes Absolute 0.8 0.1 - 1.0 K/uL   Eosinophils Relative 1 %   Eosinophils Absolute 0.2 0.0 - 0.5 K/uL   Basophils Relative 0 %   Basophils Absolute 0.1 0.0 - 0.1 K/uL   Immature Granulocytes 1 %   Abs Immature Granulocytes 0.08 (H) 0.00 - 0.07 K/uL    Comment: Performed at [facility], [place] [address], [place], [place] [address]  C-reactive protein     Status: None   Collection Time: 12/25/21  6:31 AM  Result Value Ref Range   CRP 0.7 <1.0 mg/dL    Comment: Performed at [facility], [place] [address] [place], [place] [address]  Troponin I (High Sensitivity)     Status: None   Collection Time: 12/25/21  6:31 AM  Result Value Ref Range   Troponin I (High Sensitivity) 3 <18 [name]/L    Comment: (NOTE) Elevated high sensitivity troponin I (hsTnI) values and significant  changes across serial measurements may suggest ACS but many other  chronic and acute conditions are known to elevate hsTnI results.  Refer to the "Links" section for chest pain algorithms and additional  guidance. Performed at [facility], [place] [address], [place], [place] [address]    Blood Alcohol level:  Lab Results  Component Value Date   ETH <10 11/28/2021   ETH <10 99/24/2683   Metabolic Disorder Labs: Lab Results  Component Value Date   HGBA1C 6.9 (H) 11/17/2021   MPG 151.33 11/17/2021   MPG 111.15 02/03/2020   Lab  Results  Component Value Date   PROLACTIN 8.1 06/01/2019   Lab Results  Component Value Date   CHOL 130 12/01/2021   TRIG 172 (H) 12/01/2021   HDL 28 (L) 12/01/2021   CHOLHDL 4.6 12/01/2021   VLDL 34 12/01/2021   LDLCALC 68 12/01/2021   LDLCALC 97 02/03/2020   Physical Findings:  Musculoskeletal: Strength & Muscle Tone: within normal limits Gait & Station: normal Patient leans: N/A  Psychiatric Specialty Exam:  Presentation  General Appearance: Appropriate for Environment; Casual  Eye Contact:Good  Speech:Clear and Coherent; Normal Rate  Speech Volume:Normal  Handedness:Right  Mood and Affect  Mood:Euthymic  Affect:Blunt  Thought Process  Thought Processes:Disorganized  Descriptions of Associations:Tangential  Orientation:Full (Time, Place and Person)  Thought Content:Scattered; Tangential; Illogical  History of Schizophrenia/Schizoaffective disorder:Yes  Duration of Psychotic Symptoms:Greater than six months  Hallucinations:No data recorded Denies AVH.   Ideas of Reference:None Has delusional thought content.  Suicidal Thoughts:No data recorded Denies   Homicidal Thoughts:No data recorded Denies    Sensorium  Memory:Immediate Fair; Recent Fair   Judgment:Impaired   Insight:Poor; Shallow  Executive Functions  Concentration: Poor  Attention Span: Poor  Recall:Fair  Fund of Knowledge:Fair  Language:Fair  Psychomotor Activity  Psychomotor Activity:No data recorded Normal  Assets  Assets:Desire for Improvement; Resilience; Social Support  Sleep  Sleep:Sleep: Better  Physical Exam: Physical Exam Vitals and nursing note reviewed.  Constitutional:      General: He is not in acute distress.    Appearance: He is normal weight. He is not toxic-appearing.  HENT:     Head: Normocephalic and atraumatic.     Mouth/Throat:     Mouth: Mucous membranes are moist.     Pharynx: Oropharynx is clear.  Pulmonary:     Effort:  Pulmonary effort is normal. No respiratory distress.  Skin:    General: Skin is warm and dry.  Neurological:     Mental Status: He is alert.     Motor: No weakness.     Gait: Gait normal.  Psychiatric:        Mood and Affect: Mood normal.    Review of Systems  Constitutional:  Negative for chills and fever.  Cardiovascular:  Negative for chest pain and palpitations.  Gastrointestinal: Negative.   Genitourinary: Negative.   Musculoskeletal: Negative.   Neurological:  Negative for dizziness, tingling, tremors and headaches.  Psychiatric/Behavioral:  Negative for depression, hallucinations, memory loss, substance abuse and suicidal ideas. The patient is not nervous/anxious and does not have insomnia.    Blood pressure (!) 123/92, pulse (!) 101, temperature (!) 97.2 F (36.2 C), temperature source Oral, resp. rate 20, height '6\' 1"'  (1.854 m), weight 117.9 kg, SpO2 100 %. Body mass index is 34.3 kg/m.   Treatment Plan Summary: ASSESSMENT: Principal Problem:   Schizoaffective disorder, bipolar type ([place]) Active Problems:   Tobacco use disorder   Patient appears to have inconsistent improvements on current regimen, so in addition to Clozaril will need to add on a first generation antipsychotic.   PLAN: Safety and Monitoring:             -- Involuntary admission to inpatient psychiatric unit for safety, stabilization and treatment             -- Daily contact with patient to assess and evaluate symptoms and progress in treatment             -- Patient's case to be discussed in multi-disciplinary team meeting             -- Observation Level : q15 minute checks             -- Vital signs:  q12 hours             -- Precautions: suicide, elopement, and assault   2. Psychiatric Diagnoses and Treatment:  # Schizoaffective disorder-bipolar type -Discontinue Zyprexa 08/31/13 mg, as patient would likely benefit more from an FGA -Continue Haldol 5 mg every 12 hours - Patient currently on  Abilify Maintena 400 mg LAI, last given 8/16 - Continue clozapine uptitration for schizophrenia 87.5 mg tonight, to continue to increase by 12.5 mg  daily.  -Weekly Labs to be monitored 8/29: CRP, ESR, BNP, troponin, CMP, and EKG all WNL, CBC w/ Plt 426, ANC 9200 -Continue Depakote 500 mg bid - for mood stabilization  -Trough level to add on to previous labs or AM collection -Consider beta-blocker to manage tachycardia and anxiety if HR remains persistently elevated   -- Metabolic profile and EKG monitoring obtained while on an atypical antipsychotic  BMI: 34.3 Lipid Panel: TG 172 HbgA1c: 6.9% QTc: 413 -- Encouraged patient to participate in unit milieu and in scheduled group therapies      3. Medical Issues Being Addressed: #Nicotine use disorder - Nicotine patch 21 mg daily   #T2DM Home medication Synjardi 01/999 mg BID.  Due to the fact that patient's CBGs have been largely WNL since admission and patient on home regimen and diabetes well-controlled (A1c 6.9%), so minimal concern for decompensation, we will discontinue a.m. CBG checks. - Continue metformin 1000 mg twice daily with meals - Continue Jardiance 10 mg every morning   #Hyperlipidemia - Continue home atorvastatin 40 mg daily  #Acid reflux.  - Initiated Protonix 40 mg po once today at 1630. - Initiated Protonix 40 mg po Q am (starting 12-27-21).  4. Discharge Planning:  -- Social work and case management to assist with discharge planning and identification of hospital follow-up needs prior to discharge -- Estimated LOS: 10-14 additional days while titrating Clozaril -- Discharge Concerns: Need to establish a safety plan; Medication compliance and effectiveness -- Discharge Goals: Return home with outpatient referrals for mental health follow-up including medication management/psychotherapy   James Spar, NP, pmhnp, fnp-bc. 12/26/2021, 2:45 PM  Patient ID: DEMETRIC DUNNAWAY, male   DOB: 02/04/1976, 46 y.o.   MRN:  [id number]

## 2021-12-27 DIAGNOSIS — F250 Schizoaffective disorder, bipolar type: Secondary | ICD-10-CM | POA: Diagnosis not present

## 2021-12-27 LAB — CK: Total CK: 173 U/L (ref 49–397)

## 2021-12-27 MED ORDER — ACETAMINOPHEN 325 MG PO TABS
650.0000 mg | ORAL_TABLET | Freq: Four times a day (QID) | ORAL | Status: DC | PRN
  Administered 2021-12-29 – 2022-01-07 (×6): 650 mg via ORAL
  Filled 2021-12-27 (×7): qty 2

## 2021-12-27 MED ORDER — DOCUSATE SODIUM 100 MG PO CAPS
100.0000 mg | ORAL_CAPSULE | Freq: Every day | ORAL | Status: DC
  Administered 2021-12-28: 100 mg via ORAL
  Filled 2021-12-27 (×6): qty 1

## 2021-12-27 MED ORDER — BENZTROPINE MESYLATE 0.5 MG PO TABS: 0.5000 mg | ORAL_TABLET | Freq: Two times a day (BID) | ORAL | Status: DC | PRN

## 2021-12-27 NOTE — Progress Notes (Signed)
Alert, verbal and able to make needs known. Pt has been pacing up and down the hallway today. He talks to himself at times but is pleasant. Denies SI/HI, He rates his depression, hopelessness and anxiety 0/10. He is looking forward to going home. Denis AVH. Will continue q4min checks.

## 2021-12-27 NOTE — [facility] Group Notes (Signed)
Adult Psychoeducational Group Note  Date:  12/27/2021 Time:  8:52 AM  Group Topic/Focus:  Goals Group:   The focus of this group is to help patients establish daily goals to achieve during treatment and discuss how the patient can incorporate goal setting into their daily lives to aide in recovery.  Participation Level:  Active  Participation Quality:  Appropriate  Affect:  Appropriate  Cognitive:  Appropriate  Insight: Improving  Engagement in Group:  Engaged  Modes of Intervention:  Discussion   Donell Beers 12/27/2021, 8:52 AM

## 2021-12-27 NOTE — [facility] Group Notes (Signed)
Adult Psychoeducational Group Note  Date:  12/27/2021 Time:  8:52 PM  Group Topic/Focus:  Wrap-Up Group:   The focus of this group is to help patients review their daily goal of treatment and discuss progress on daily workbooks.  Participation Level:  Active  Participation Quality:  Appropriate and Attentive  Affect:  Appropriate  Cognitive:  Alert and Appropriate  Insight: Appropriate and Good  Engagement in Group:  Engaged  Modes of Intervention:  Discussion and Education  Additional Comments:  Pt attended and participated in wrap up group this evening and rated their day a 10/10. Pt states that they did a lot of walking and states that they have been less agitated today. Pt states that their goal is to be discharged, but they have no anticipated discharge date. Pt would like to report that they have been having knee pain, but states that ibuprofen and cherry coke helps with the pain.   Chrisandra Netters 12/27/2021, 8:52 PM

## 2021-12-27 NOTE — Progress Notes (Addendum)
[facility] MD Progress Note  12/27/2021 2:41 PM PRANAV LINCE  MRN:  [id number]   Reason for admission:  Nile Prisk is a 46 year old male with a reported past psychiatric history of schizophrenia-paranoid, bipolar disorder housing insecurity who presented to [facility] ED on 8/3 under IVC for psychosis and bizarre behaviors.     On evaluation today: Jamaree is seen, chart reviewed. The chart findings discussed with the treatment team. He presents alert, oriented & aware of situation. He is visible on the unit, attending group sessions. 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 He rather reports today that although he is doing fine & does not have much to complain about today, he did mention that his legs feel heavy upon waking up in the morning. He was asked how did he managed to get out of bed today & walking around, he replied "I try to get up & walk around to get my legs feel less heavy". Shaiden went further to say that he told the staff that he is no longer going to take the sleep medicine prior to bedtime any more as he has difficulty waking up to use the bathroom at night. Staff says patient has not complained of any new issues or concerns to them today. Damondre also states that part of his problem is that his current tennis shoes are wearing out & does no longer provide him with adequate support during ambulation or when standing still. He says he has already called his mother to bring his newer tennis shoes. He is encouraged to move around as much as he can within the hallway & rest when tired. Assessment revealed no edema to lower extremities. He is taking his recommended treatment regimen. Other than refusing to take the sleeping medication as he has difficulties waking up to use the bathroom at night, he denies any other issues or concerns. He denies any symptoms of acid reflux. Reviewed current lab  result, valproic acid - 40 (L), WBC - 13.4 (^), Platelets - 426 (^). Vital signs remain stable. Discussed this case with the attending psychiatrist, instructed to obtain CK. Will continue current plan of care as already in progress.  Daily review of symptoms, specific for clozapine: Malaise/Sedation: Denies Chest pain: Denies Shortness of breath: Denies Exertional capacity: Denies Tachycardia: Denies Cough: Denies Sore Throat: Denies Fever: Denies Orthostatic hypotension (dizziness with standing): Denies Hypersalivation: Denies Constipation: Reports last bowel movement was yesterday Symptoms of GERD: Denies Nausea: Denies Nocturnal enuresis: Denies   Principal Problem: Schizoaffective disorder, bipolar type ([place]) Diagnosis: Principal Problem:   Schizoaffective disorder, bipolar type ([place]) Active Problems:   Tobacco use disorder   Diabetes ([place])   Hyperlipidemia  Total Time spent with patient: 20 minutes  Past Psychiatric History:  Schizophrenia, Schizoaffective disorder-bipolar type Prior inpatient treatment: [facility], [facility] 04[facility]2018 Prior outpatient treatment: [facility] History of suicide: History of homicide: Denies Psychiatric medication history: Previously on 6/14 Zyprexa 5 mg qAM and 30 mg qHS; Prolixin 10 mg in 05/2020, Invega Sustenna, PO Invega, and Haldol (02/2020), Neurontin.  Currently taking Abilify Maintena 400 mg and Zyprexa 10 mg BID Psychiatric medication compliance history: Reports compliance, and picking up medications appropriately, per pharmacy records Neuromodulation history: Denies Current Psychiatrist:[facility] services via [name]  Past Medical History:  Past Medical History:  Diagnosis Date   Bipolar disorder ([place])    Bronchitis    Hyperlipidemia  Paranoid schizophrenia ([place])    Schizophrenia ([place])     Past Surgical History:  Procedure Laterality Date   TESTICLE IMPLANTATION TO THIGH     TESTICLE SURGERY     Family History:  Family History   Problem Relation Age of Onset   Schizophrenia Neg Hx    Family Psychiatric  History: See H&P.   Social History:  Social History   Substance and Sexual Activity  Alcohol Use Not Currently   Comment: weekly     Social History   Substance and Sexual Activity  Drug Use Yes   Types: Marijuana   Comment: smokes hemp    Social History   Socioeconomic History   Marital status: Single    Spouse name: Not on file   Number of children: Not on file   Years of education: 12 years   Highest education level: 12th grade  Occupational History   Occupation: On disability  Tobacco Use   Smoking status: Every Day    Packs/day: 1.00    Types: Cigarettes   Smokeless tobacco: Never  Vaping Use   Vaping Use: Never used  Substance and Sexual Activity   Alcohol use: Not Currently    Comment: weekly   Drug use: Yes    Types: Marijuana    Comment: smokes hemp   Sexual activity: Yes    Birth control/protection: Condom  Other Topics Concern   Not on file  Social History Narrative   Pt lives alone in apartment complex for mentally ill   Social Determinants of Health   Financial Resource Strain: Not on file  Food Insecurity: Not on file  Transportation Needs: Not on file  Physical Activity: Not on file  Stress: Not on file  Social Connections: Not on file   Additional Social History:   Sleep: Fair  Appetite:  Good  Current Medications: Current Facility-Administered Medications  Medication Dose Route Frequency Provider Last Rate Last Admin   alum & mag hydroxide-simeth (MAALOX/MYLANTA) 200-200-20 MG/5ML suspension 30 mL  30 mL Oral Q4H PRN Bobbitt, Shalon E, NP   30 mL at 12/20/21 1135   atorvastatin (LIPITOR) tablet 40 mg  40 mg Oral QHS Bobbitt, Shalon E, NP   40 mg at 12/26/21 2037   benztropine (COGENTIN) tablet 0.5 mg  0.5 mg Oral Q12H Massengill, Nathan, MD   0.5 mg at 12/27/21 0749   cloZAPine (CLOZARIL) tablet 112.5 mg  112.5 mg Oral QHS Harlow Asa, MD        [START ON 12/28/2021] cloZAPine (CLOZARIL) tablet 125 mg  125 mg Oral QHS Addyson Traub E, MD       divalproex (DEPAKOTE) DR tablet 500 mg  500 mg Oral Q12H Massengill, Nathan, MD   500 mg at 12/27/21 0749   docusate sodium (COLACE) capsule 100 mg  100 mg Oral Daily PRN France Ravens, MD       empagliflozin (JARDIANCE) tablet 10 mg  10 mg Oral Daily Rosezetta Schlatter, MD   10 mg at 12/27/21 0750   haloperidol (HALDOL) tablet 5 mg  5 mg Oral Q12H Massengill, Nathan, MD   5 mg at 12/27/21 0750   haloperidol (HALDOL) tablet 5 mg  5 mg Oral Q8H PRN Massengill, Ovid Curd, MD       And   LORazepam (ATIVAN) tablet 1 mg  1 mg Oral Q8H PRN Massengill, Nathan, MD       haloperidol lactate (HALDOL) injection 5 mg  5 mg Intramuscular Q8H PRN Janine Limbo, MD  And   LORazepam (ATIVAN) injection 1 mg  1 mg Intramuscular Q8H PRN Massengill, Ovid Curd, MD       hydrOXYzine (ATARAX) tablet 25 mg  25 mg Oral TID PRN Bobbitt, Shalon E, NP   25 mg at 12/26/21 2037   ibuprofen (ADVIL) tablet 800 mg  800 mg Oral BID PRN Harlow Asa, MD   800 mg at 12/25/21 1644   magnesium hydroxide (MILK OF MAGNESIA) suspension 30 mL  30 mL Oral Daily PRN Bobbitt, Shalon E, NP   30 mL at 12/10/21 2029   melatonin tablet 5 mg  5 mg Oral QHS PRN Massengill, Ovid Curd, MD   5 mg at 12/26/21 2037   metFORMIN (GLUCOPHAGE) tablet 1,000 mg  1,000 mg Oral BID WC Bobbitt, Shalon E, NP   1,000 mg at 12/27/21 0750   Muscle Rub CREA   Topical PRN Rosezetta Schlatter, MD   1 Application at 38/46/65 0953   nicotine (NICODERM CQ - dosed in mg/24 hours) patch 21 mg  21 mg Transdermal Daily Rosezetta Schlatter, MD   21 mg at 12/27/21 0753   pantoprazole (PROTONIX) EC tablet 40 mg  40 mg Oral Daily Nwoko, Herbert Pun I, NP   40 mg at 12/27/21 0751   polyethylene glycol (MIRALAX / GLYCOLAX) packet 17 g  17 g Oral Daily PRN Nelda Marseille, Lakeena Downie E, MD       sodium chloride (OCEAN) 0.65 % nasal spray 1 spray  1 spray Each Nare PRN Rosezetta Schlatter, MD   1 spray at 12/25/21  0834   temazepam (RESTORIL) capsule 30 mg  30 mg Oral QHS PRN Harlow Asa, MD   30 mg at 12/26/21 2037   traZODone (DESYREL) tablet 100 mg  100 mg Oral QHS PRN Harlow Asa, MD   100 mg at 12/26/21 2037   Lab Results:  Results for orders placed or performed during the hospital encounter of 12/07/21 (from the past 48 hour(s))  Valproic acid level     Status: Abnormal   Collection Time: 12/26/21  6:37 PM  Result Value Ref Range   Valproic Acid Lvl 40 (L) 50.0 - 100.0 ug/mL    Comment: Performed at [facility], [place] [address] [place], [place] [address]   Blood Alcohol level:  Lab Results  Component Value Date   ETH <10 11/28/2021   ETH <10 01/77/9390   Metabolic Disorder Labs: Lab Results  Component Value Date   HGBA1C 6.9 (H) 11/17/2021   MPG 151.33 11/17/2021   MPG 111.15 02/03/2020   Lab Results  Component Value Date   PROLACTIN 8.1 06/01/2019   Lab Results  Component Value Date   CHOL 130 12/01/2021   TRIG 172 (H) 12/01/2021   HDL 28 (L) 12/01/2021   CHOLHDL 4.6 12/01/2021   VLDL 34 12/01/2021   LDLCALC 68 12/01/2021   LDLCALC 97 02/03/2020   Physical Findings:  Musculoskeletal: Strength & Muscle Tone: within normal limits Gait & Station: normal Patient leans: N/A  Psychiatric Specialty Exam:  Presentation  General Appearance: Appropriate for Environment; Casual  Eye Contact:Fair  Speech:Clear and Coherent; Normal Rate  Speech Volume:Normal  Handedness:Right  Mood and Affect  Mood:described as good - appears calm   Affect:constricted, calm, polite  Thought Process  Thought Processes: Less disorganized, ruminative some about discharge planning  Orientation:Full (Time, Place and Person)  Thought Content:Less preoccupation with delusional content today; denies AVH, paranoia, ideas of reference or first rank symptoms; denies SI or HI; per staff has  been seen talking to self but not responding to internal stimuli on  exam  History of Schizophrenia/Schizoaffective disorder:Yes  Duration of Psychotic Symptoms:Greater than six months  Hallucinations: Denies AVH.   Ideas of Reference:None   Suicidal Thoughts: Denies   Homicidal Thoughts: Denies    Sensorium  Memory:Immediate Fair; Recent Fair   Judgment:Fair - compliant with medications   Insight:Poor; Shallow  Executive Functions  Concentration: Fair  Attention Span: [place]  Psychomotor Activity  Psychomotor Activity: Normal  Assets  Assets:Desire for Improvement; Resilience; Social Support  Sleep  6.75 hours  Physical Exam: Physical Exam Vitals and nursing note reviewed.  Constitutional:      General: He is not in acute distress.    Appearance: He is normal weight. He is not toxic-appearing.  HENT:     Head: Normocephalic and atraumatic.     Mouth/Throat:     Mouth: Mucous membranes are moist.     Pharynx: Oropharynx is clear.  Pulmonary:     Effort: Pulmonary effort is normal. No respiratory distress.  Skin:    General: Skin is warm and dry.  Neurological:     Mental Status: He is alert.     Motor: No weakness.     Gait: Gait normal.  Psychiatric:        Mood and Affect: Mood normal.    Review of Systems  Constitutional:  Negative for chills and fever.  Cardiovascular:  Negative for chest pain and palpitations.  Gastrointestinal: Negative.   Genitourinary: Negative.   Musculoskeletal: Negative.   Neurological:  Negative for dizziness, tingling, tremors and headaches.  Psychiatric/Behavioral:  Negative for depression, hallucinations, memory loss, substance abuse and suicidal ideas. The patient is not nervous/anxious and does not have insomnia.    Blood pressure 119/78, pulse 94, temperature 97.6 F (36.4 C), temperature source Oral, resp. rate 18, height '6\' 1"'  (1.854 m), weight 117.9 kg, SpO2 99 %. Body mass index is 34.3 kg/m.   Treatment  Plan Summary: ASSESSMENT: Principal Problem:   Schizoaffective disorder, bipolar type ([place]) Active Problems:   Tobacco use disorder   Patient appears to have inconsistent improvements on current regimen, so in addition to Clozaril will need to add on a first generation antipsychotic.   PLAN: Safety and Monitoring:             -- Involuntary admission to inpatient psychiatric unit for safety, stabilization and treatment             -- Daily contact with patient to assess and evaluate symptoms and progress in treatment             -- Patient's case to be discussed in multi-disciplinary team meeting             -- Observation Level : q15 minute checks             -- Vital signs:  q12 hours             -- Precautions: suicide, elopement, and assault   2. Psychiatric Diagnoses and Treatment:  # Schizoaffective disorder-bipolar type -Discontinued Zyprexa 08/31/13 mg, as patient would likely benefit more from an FGA -Continue Haldol 5 mg every 12 hours - Patient currently on Abilify Maintena 400 mg LAI, last given 8/16 - Continue clozapine uptitration for schizophrenia to 112.22m qhs tonight   -Weekly Labs to be monitored 8/29: CRP, ESR, BNP, troponin, CMP, and EKG all WNL, CBC w/ Plt 426, ANC  9200 -Continue Depakote 500 mg bid - for mood stabilization (VPA level 40 8/30) - not currently manic or agitated so will hold on further dose titration at this time given previous issues with elevated ammonia at higher dose   -- Metabolic profile and EKG monitoring obtained while on antipsychotics  BMI: 34.3 Lipid Panel: TG 172 HbgA1c: 6.9% QTc: 413 -- Encouraged patient to participate in unit milieu and in scheduled group therapies  -- Checking total CK given vague c/o related to leg heaviness- currently afebrile and VSS; continue to monitor with Clozaril titration  -- D/c Cogentin with dose titration up on Clozaril which is more anticholinergic due to c/o sedation and monitor for EPS - Schedule  Colace 193m daily with dose titration up on Clozaril   3. Medical Issues Being Addressed: #Nicotine use disorder - Nicotine patch 21 mg daily   #T2DM Home medication Synjardi 01/999 mg BID.  Due to the fact that patient's CBGs have been largely WNL since admission and patient on home regimen and diabetes well-controlled (A1c 6.9%), so minimal concern for decompensation, we will discontinue a.m. CBG checks. - Continue metformin 1000 mg twice daily with meals - Continue Jardiance 10 mg every morning   #Hyperlipidemia - Continue home atorvastatin 40 mg daily  #Acid reflux.  - Protonix 40 mg po once today at 1630 (completed). - Continue Protonix 40 mg po Q am (starting 12-27-21).  4. Discharge Planning:  -- Social work and case management to assist with discharge planning and identification of hospital follow-up needs prior to discharge -- Estimated LOS: 10-14 additional days while titrating Clozaril -- Discharge Concerns: Need to establish a safety plan; Medication compliance and effectiveness -- Discharge Goals: Return home with outpatient referrals for mental health follow-up including medication management/psychotherapy   ALindell Spar NP, pmhnp, fnp-bc. 12/27/2021, 2:41 PM  Patient ID: BShirlean Mylar male   DOB: 524-Aug-1977 46y.o.   MRN: [id number] ID: BAADIL SUR male   DOB: 5March 24, 1977 46y.o.   MRN: [id number]

## 2021-12-27 NOTE — Progress Notes (Signed)
   12/27/21 2115  Psych Admission Type (Psych Patients Only)  Admission Status Involuntary  Psychosocial Assessment  Patient Complaints Hyperactivity  Eye Contact Brief  Facial Expression Animated  Affect Preoccupied  Speech Pressured  Interaction Assertive  Motor Activity Pacing  Appearance/Hygiene Unremarkable  Behavior Characteristics Cooperative  Mood Suspicious;Pleasant  Aggressive Behavior  Effect No apparent injury  Thought Process  Coherency Disorganized;Flight of ideas;Loose associations  Content Delusions;Confabulation  Delusions Grandeur  Perception Hallucinations  Hallucination Auditory;Visual  Judgment Limited  Confusion None  Danger to Self  Current suicidal ideation? Denies  Danger to Others  Danger to Others None reported or observed

## 2021-12-27 NOTE — Progress Notes (Signed)
   12/27/21 0515  Sleep  Number of Hours 6.75

## 2021-12-27 NOTE — Progress Notes (Signed)
Patient pleasantly psychotic. He drank some coke at dinner and believes it helped his "growing pains" in his knee.

## 2021-12-28 ENCOUNTER — Encounter (HOSPITAL_COMMUNITY): Payer: Self-pay

## 2021-12-28 DIAGNOSIS — F250 Schizoaffective disorder, bipolar type: Secondary | ICD-10-CM | POA: Diagnosis not present

## 2021-12-28 NOTE — Progress Notes (Signed)
   12/28/21 0500  Sleep  Number of Hours 5

## 2021-12-28 NOTE — BH IP Treatment Plan (Signed)
Interdisciplinary Treatment and Diagnostic Plan Update  12/28/2021 Time of Session: 10:05am  [name] MRN: [id number]  Principal Diagnosis: Schizoaffective disorder, bipolar type [facility])  Secondary Diagnoses: Principal Problem:   Schizoaffective disorder, bipolar type (HCC) Active Problems:   Tobacco use disorder   Diabetes (HCC)   Hyperlipidemia   Current Medications:  Current Facility-Administered Medications  Medication Dose Route Frequency Provider Last Rate Last Admin   acetaminophen (TYLENOL) tablet 650 mg  650 mg Oral Q6H PRN Lamar Sprinkles, MD       alum & mag hydroxide-simeth (MAALOX/MYLANTA) 200-200-20 MG/5ML suspension 30 mL  30 mL Oral Q4H PRN Bobbitt, Shalon E, NP   30 mL at 12/20/21 1135   atorvastatin (LIPITOR) tablet 40 mg  40 mg Oral QHS Bobbitt, Shalon E, NP   40 mg at 12/27/21 2053   benztropine (COGENTIN) tablet 0.5 mg  0.5 mg Oral BID PRN Comer Locket, MD       cloZAPine (CLOZARIL) tablet 125 mg  125 mg Oral QHS Singleton, Amy E, MD       divalproex (DEPAKOTE) DR tablet 500 mg  500 mg Oral Q12H Massengill, Nathan, MD   500 mg at 12/28/21 0820   docusate sodium (COLACE) capsule 100 mg  100 mg Oral Daily Mason Jim, Amy E, MD   100 mg at 12/28/21 7124   empagliflozin (JARDIANCE) tablet 10 mg  10 mg Oral Daily Lamar Sprinkles, MD   10 mg at 12/28/21 1010   haloperidol (HALDOL) tablet 5 mg  5 mg Oral Q12H Massengill, Harrold Donath, MD   5 mg at 12/28/21 5809   haloperidol (HALDOL) tablet 5 mg  5 mg Oral Q8H PRN Massengill, Harrold Donath, MD       And   LORazepam (ATIVAN) tablet 1 mg  1 mg Oral Q8H PRN Massengill, Nathan, MD       haloperidol lactate (HALDOL) injection 5 mg  5 mg Intramuscular Q8H PRN Massengill, Nathan, MD       And   LORazepam (ATIVAN) injection 1 mg  1 mg Intramuscular Q8H PRN Massengill, Nathan, MD       hydrOXYzine (ATARAX) tablet 25 mg  25 mg Oral TID PRN Bobbitt, Shalon E, NP   25 mg at 12/27/21 2053   ibuprofen (ADVIL) tablet 800 mg  800 mg Oral BID  PRN Comer Locket, MD   800 mg at 12/28/21 9833   magnesium hydroxide (MILK OF MAGNESIA) suspension 30 mL  30 mL Oral Daily PRN Bobbitt, Shalon E, NP   30 mL at 12/10/21 2029   melatonin tablet 5 mg  5 mg Oral QHS PRN Massengill, Harrold Donath, MD   5 mg at 12/27/21 2053   metFORMIN (GLUCOPHAGE) tablet 1,000 mg  1,000 mg Oral BID WC Bobbitt, Shalon E, NP   1,000 mg at 12/28/21 8250   Muscle Rub CREA   Topical PRN Lamar Sprinkles, MD   1 Application at 12/25/21 0953   nicotine (NICODERM CQ - dosed in mg/24 hours) patch 21 mg  21 mg Transdermal Daily Lamar Sprinkles, MD   21 mg at 12/28/21 0821   pantoprazole (PROTONIX) EC tablet 40 mg  40 mg Oral Daily Nwoko, Nicole Kindred I, NP   40 mg at 12/28/21 0819   polyethylene glycol (MIRALAX / GLYCOLAX) packet 17 g  17 g Oral Daily PRN Mason Jim, Amy E, MD       sodium chloride (OCEAN) 0.65 % nasal spray 1 spray  1 spray Each Nare PRN Lamar Sprinkles, MD  1 spray at 12/25/21 0834   temazepam (RESTORIL) capsule 30 mg  30 mg Oral QHS PRN Comer Locket, MD   30 mg at 12/27/21 2053   traZODone (DESYREL) tablet 100 mg  100 mg Oral QHS PRN Comer Locket, MD   100 mg at 12/27/21 2053   PTA Medications: Medications Prior to Admission  Medication Sig Dispense Refill Last Dose   ABILIFY MAINTENA 400 MG PRSY prefilled syringe Inject 400 mg into the muscle every 28 (twenty-eight) days.      atorvastatin (LIPITOR) 40 MG tablet Take 40 mg by mouth at bedtime.      ibuprofen (ADVIL) 800 MG tablet Take 800 mg by mouth 2 (two) times daily as needed for fever, mild pain or headache.      metFORMIN (GLUCOPHAGE) 500 MG tablet Take 2 tablets (1,000 mg total) by mouth 2 (two) times daily with a meal. 60 tablet 0    naproxen (NAPROSYN) 500 MG tablet Take 1 tablet (500 mg total) by mouth 2 (two) times daily. 10 tablet 0     Patient Stressors: Medication change or noncompliance    Patient Strengths: Capable of independent living  Motivation for treatment/growth  Supportive  family/friends   Treatment Modalities: Medication Management, Group therapy, Case management,  1 to 1 session with clinician, Psychoeducation, Recreational therapy.   Physician Treatment Plan for Primary Diagnosis: Schizoaffective disorder, bipolar type (HCC) Long Term Goal(s): Improvement in symptoms so as ready for discharge   Short Term Goals: Ability to identify changes in lifestyle to reduce recurrence of condition will improve Ability to verbalize feelings will improve Ability to demonstrate self-control will improve Ability to identify and develop effective coping behaviors will improve Compliance with prescribed medications will improve  Medication Management: Evaluate patient's response, side effects, and tolerance of medication regimen.  Therapeutic Interventions: 1 to 1 sessions, Unit Group sessions and Medication administration.  Evaluation of Outcomes: Progressing  Physician Treatment Plan for Secondary Diagnosis: Principal Problem:   Schizoaffective disorder, bipolar type (HCC) Active Problems:   Tobacco use disorder   Diabetes (HCC)   Hyperlipidemia  Long Term Goal(s): Improvement in symptoms so as ready for discharge   Short Term Goals: Ability to identify changes in lifestyle to reduce recurrence of condition will improve Ability to verbalize feelings will improve Ability to demonstrate self-control will improve Ability to identify and develop effective coping behaviors will improve Compliance with prescribed medications will improve     Medication Management: Evaluate patient's response, side effects, and tolerance of medication regimen.  Therapeutic Interventions: 1 to 1 sessions, Unit Group sessions and Medication administration.  Evaluation of Outcomes: Progressing   RN Treatment Plan for Primary Diagnosis: Schizoaffective disorder, bipolar type (HCC) Long Term Goal(s): Knowledge of disease and therapeutic regimen to maintain health will  improve  Short Term Goals: Ability to remain free from injury will improve, Ability to participate in decision making will improve, Ability to verbalize feelings will improve, Ability to disclose and discuss suicidal ideas, and Ability to identify and develop effective coping behaviors will improve  Medication Management: RN will administer medications as ordered by provider, will assess and evaluate patient's response and provide education to patient for prescribed medication. RN will report any adverse and/or side effects to prescribing provider.  Therapeutic Interventions: 1 on 1 counseling sessions, Psychoeducation, Medication administration, Evaluate responses to treatment, Monitor vital signs and CBGs as ordered, Perform/monitor CIWA, COWS, AIMS and Fall Risk screenings as ordered, Perform wound care treatments as ordered.  Evaluation  of Outcomes: Progressing   LCSW Treatment Plan for Primary Diagnosis: Schizoaffective disorder, bipolar type (HCC) Long Term Goal(s): Safe transition to appropriate next level of care at discharge, Engage patient in therapeutic group addressing interpersonal concerns.  Short Term Goals: Engage patient in aftercare planning with referrals and resources, Increase social support, Increase emotional regulation, Facilitate acceptance of mental health diagnosis and concerns, Identify triggers associated with mental health/substance abuse issues, and Increase skills for wellness and recovery  Therapeutic Interventions: Assess for all discharge needs, 1 to 1 time with Social worker, Explore available resources and support systems, Assess for adequacy in community support network, Educate family and significant other(s) on suicide prevention, Complete Psychosocial Assessment, Interpersonal group therapy.  Evaluation of Outcomes: Progressing   Progress in Treatment: Attending groups: Yes. Participating in groups: Yes. Taking medication as prescribed:  Yes. Toleration medication: Yes. Family/Significant other contact made: Yes, individual(s) contacted:   Clearance Chenault, father,  [phone]  Patient understands diagnosis: No. Discussing patient identified problems/goals with staff: Yes. Medical problems stabilized or resolved: Yes. Denies suicidal/homicidal ideation: Yes. Issues/concerns per patient self-inventory: Yes. Other: none   New problem(s) identified: No, Describe:  none   New Short Term/Long Term Goal(s): Patient to work towards detox, elimination of symptoms of psychosis, medication management for mood stabilization; development of comprehensive mental wellness/sobriety plan.   Patient Goals: No additional goals identified at this time. Patient to continue to work towards original goals identified in initial treatment team meeting. CSW will remain available to patient should they voice additional treatment goals.    Discharge Plan or Barriers: No psychosocial barriers identified at this time, patient to return to place of residence when appropriate for discharge.    Reason for Continuation of Hospitalization: Other; describe psychosis    Estimated Length of Stay: 1-7 days    Last 3 [place] Suicide Severity Risk Score: Flowsheet Row Ad[facility] (Current) [facility] from [facility]29/2021 in [facility] from 11/28/2021 in [facility] EMERGENCY DEPARTMENT  C-SSRS RISK CATEGORY No Risk No Risk No Risk       Last PHQ 2/9 Scores:    11/02/2021    6:10 PM  Depression screen PHQ 2/9  Decreased Interest 1  Down, Depressed, Hopeless 0  PHQ - 2 Score 1    Scribe for Treatment Team: [name], Theresia Majors 12/28/2021 2:16 PM

## 2021-12-28 NOTE — Group Note (Signed)
LCSW Group Therapy  Type of Therapy and Topic:  Group Therapy: Thoughts, Feelings, and Actions  Participation Level:  Minimal   Description of Group:   In this group, each patient discussed their previous experiencing and understanding of overthinking, identifying the harmful impact on their lives. As a group, each patient was introduced to the basic concepts of Cognitive Behavioral Therapy: that thoughts, feelings, and actions are all connected and influence one another. They were given examples of how overthinking can affect our feelings, actions, and vise versa. The group was then asked to analyze how overthinking was harmful and brainstorm alternative thinking patterns/reactions to the example situation. Then, each group member filled out and identified their own example situation in which a problem situation caused their thoughts, feelings, and actions to be negatively impacted; they were asked to come up with 3 new (more adaptive/positive) thoughts that led to 3 new feelings and actions.  Therapeutic Goals: Patients will review and discuss their past experience with overthinking. Patients will learn the basics of the CBT model through group-led examples.. Patients will identify situations where they may have negative thoughts, feelings, or actions and will then reframe the situation using more positive thoughts to react differently.  Summary of Patient Progress:  Patient contributed to the discussion of how thoughts, feelings, and actions interact, noting when they may have experienced a negative thought pattern and recognized it as harmful. They were attentive when other patients shared their experiences, and worked to reframe their own thoughts in an activity to identify future situations where they may typically overthink.  Therapeutic Modalities:   Cognitive Behavioral Therapy Mindfulness  Beatris Si, LCSW 12/28/2021  11:56 AM

## 2021-12-28 NOTE — Plan of Care (Signed)
Patient alert and oriented x4. His delusions have improved since yesterday but he still appears bizarre at times. He denies any pain, SI/HI or AVH at present. He rates his anxiety and depression a 0/10. He has attended groups and been pacing up and down the hallway the majority of the day. Will continue q80min checks and monitor.    Problem: Activity: Goal: Will verbalize the importance of balancing activity with adequate rest periods Outcome: Progressing   Problem: Education: Goal: Will be free of psychotic symptoms Outcome: Progressing Goal: Knowledge of the prescribed therapeutic regimen will improve Outcome: Progressing   Problem: Coping: Goal: Coping ability will improve Outcome: Progressing Goal: Will verbalize feelings Outcome: Progressing   Problem: Health Behavior/Discharge Planning: Goal: Compliance with prescribed medication regimen will improve Outcome: Progressing   Problem: Nutritional: Goal: Ability to achieve adequate nutritional intake will improve Outcome: Progressing

## 2021-12-28 NOTE — Progress Notes (Signed)
[facility] MD Progress Note  12/28/2021 7:04 AM [name]  MRN:  [id number]  Reason for Admission:  [name] is a 46-year-old male with a reported past psychiatric history of schizophrenia paranoid, bipolar disorder housing insecurity who presented to [facility] on 8/3 under IVC for psychosis and bizarre behaviors.  The patient is currently on Hospital Day 21.   Chart Review from last 24 hours:  The patient's chart was reviewed and nursing notes were reviewed. The patient's case was discussed in multidisciplinary team meeting. Per nursing he remained disorganized, seen talking to himself, but pleasant. He attended groups. He had not acute behavioral issues noted. Per MAR he was compliant with scheduled medications except Colace. He received Advil X1 yesterday and X1 today, and Vistaril X1, Melatonin X1, Restoril X1 and Trazodone X1 for sleep.  Yesterday's Recommendations per Psychiatry Team: -Continue Haldol 5 mg every 12 hours - Patient currently on Abilify Maintena 400 mg LAI, last given 8/16 - Continue clozapine up titration for schizophrenia to 112.5mg qhs tonight       -Continue Depakote 500 mg bid - for mood stabilization (VPA level 40 8/30) - not currently manic or agitated so will hold on further dose titration at this time given previous issues with elevated ammonia at higher dose - Continue metformin 1000 mg twice daily with meals - Continue Jardiance 10 mg every morning  Continue Protonix 40 mg po Q am  - Continue home atorvastatin 40 mg daily - Nicotine patch 21 mg daily -- Checking total CK given vague c/o related to leg heaviness- currently afebrile and VSS; continue to monitor with Clozaril titration  -- D/c Cogentin with dose titration up on Clozaril which is more anticholinergic due to c/o sedation and monitor for EPS - Schedule Colace 100mg daily with dose titration up on Clozaril  Information Obtained Today During Patient Interview: The patient was seen and evaluated  on the unit. He states he slept well overnight despite staff only recording 5 hours. He states he slept some during the day yesterday and is worried his nighttime sleep meds are "too much." He reports good appetite and voices no physical complaints other than intermittent right knee pain for which he uses Motrin. He specifically denies myalgias or LE pain/swelling/heaviness today. He reports having BM yesterday and specifically denies constipation, drooling, enuresis, urinary issues, dizziness, sore throat, palpitations, CP or SOB with Clozaril titration. He denies current issues with oversedation. He states his mood is "good" and overall he is pleasant and more linear on exam today. He does not need as frequent an interruption during interview and does not derail into delusional topics as frequently. He does reference belief that God "talks to him in his mind" and he is able to communicate back with God thought his thoughts. He denies VH, ideas of reference, paranoia, or belief in thought broadcasting/insertion/withdrawal today. He denies SI or HI. He derails into discussion about belief that he "met God in the [employer] parking lot" in the past. When asked orientation questions he is oriented X3 but then derails into discussion that it may not be 2023 in other countries or lands.    Principal Problem: Schizoaffective disorder, bipolar type (HCC) Diagnosis: Principal Problem:   Schizoaffective disorder, bipolar type (HCC) Active Problems:   Tobacco use disorder   Diabetes (HCC)   Hyperlipidemia   Total Time Spent in Direct Patient Care:  I personally spent 30 minutes on the unit in direct patient care. The direct patient care time   included face-to-face time with the patient, reviewing the patient's chart, communicating with other professionals, and coordinating care. Greater than 50% of this time was spent in counseling or coordinating care with the patient regarding goals of hospitalization,  psycho-education, and discharge planning needs.  Past Psychiatric History: See H&P  Past Medical History:  Past Medical History:  Diagnosis Date   Bipolar disorder ([place])    Bronchitis    Hyperlipidemia    Paranoid schizophrenia ([place])    Schizophrenia ([place])     Past Surgical History:  Procedure Laterality Date   TESTICLE IMPLANTATION TO THIGH     TESTICLE SURGERY     Family History:  Family History  Problem Relation Age of Onset   Schizophrenia Neg Hx    Family Psychiatric  History: See H&P  Social History:  Social History   Substance and Sexual Activity  Alcohol Use Not Currently   Comment: weekly     Social History   Substance and Sexual Activity  Drug Use Yes   Types: Marijuana   Comment: smokes hemp    Social History   Socioeconomic History   Marital status: Single    Spouse name: Not on file   Number of children: Not on file   Years of education: 12 years   Highest education level: 12th grade  Occupational History   Occupation: On disability  Tobacco Use   Smoking status: Every Day    Packs/day: 1.00    Types: Cigarettes   Smokeless tobacco: Never  Vaping Use   Vaping Use: Never used  Substance and Sexual Activity   Alcohol use: Not Currently    Comment: weekly   Drug use: Yes    Types: Marijuana    Comment: smokes hemp   Sexual activity: Yes    Birth control/protection: Condom  Other Topics Concern   Not on file  Social History Narrative   Pt lives alone in apartment complex for mentally ill   Social Determinants of Health   Financial Resource Strain: Not on file  Food Insecurity: Not on file  Transportation Needs: Not on file  Physical Activity: Not on file  Stress: Not on file  Social Connections: Not on file   Sleep: Fair - 5 hours  Appetite:  Good  Current Medications: Current Facility-Administered Medications  Medication Dose Route Frequency Provider Last Rate Last Admin   acetaminophen (TYLENOL) tablet 650 mg  650 mg Oral  Q6H PRN Rosezetta Schlatter, MD       alum & mag hydroxide-simeth (MAALOX/MYLANTA) 200-200-20 MG/5ML suspension 30 mL  30 mL Oral Q4H PRN Bobbitt, Shalon E, NP   30 mL at 12/20/21 1135   atorvastatin (LIPITOR) tablet 40 mg  40 mg Oral QHS Bobbitt, Shalon E, NP   40 mg at 12/27/21 2053   benztropine (COGENTIN) tablet 0.5 mg  0.5 mg Oral BID PRN Harlow Asa, MD       cloZAPine (CLOZARIL) tablet 125 mg  125 mg Oral QHS Sigurd Pugh E, MD       divalproex (DEPAKOTE) DR tablet 500 mg  500 mg Oral Q12H Massengill, Nathan, MD   500 mg at 12/27/21 2052   docusate sodium (COLACE) capsule 100 mg  100 mg Oral Daily Nelda Marseille, Lyne Khurana E, MD       empagliflozin (JARDIANCE) tablet 10 mg  10 mg Oral Daily Rosezetta Schlatter, MD   10 mg at 12/27/21 0750   haloperidol (HALDOL) tablet 5 mg  5 mg Oral Q12H Janine Limbo, MD  5 mg at 12/27/21 2053   haloperidol (HALDOL) tablet 5 mg  5 mg Oral Q8H PRN Massengill, Ovid Curd, MD       And   LORazepam (ATIVAN) tablet 1 mg  1 mg Oral Q8H PRN Massengill, Nathan, MD       haloperidol lactate (HALDOL) injection 5 mg  5 mg Intramuscular Q8H PRN Massengill, Ovid Curd, MD       And   LORazepam (ATIVAN) injection 1 mg  1 mg Intramuscular Q8H PRN Massengill, Ovid Curd, MD       hydrOXYzine (ATARAX) tablet 25 mg  25 mg Oral TID PRN Bobbitt, Shalon E, NP   25 mg at 12/27/21 2053   ibuprofen (ADVIL) tablet 800 mg  800 mg Oral BID PRN Harlow Asa, MD   800 mg at 12/28/21 6269   magnesium hydroxide (MILK OF MAGNESIA) suspension 30 mL  30 mL Oral Daily PRN Bobbitt, Shalon E, NP   30 mL at 12/10/21 2029   melatonin tablet 5 mg  5 mg Oral QHS PRN Massengill, Ovid Curd, MD   5 mg at 12/27/21 2053   metFORMIN (GLUCOPHAGE) tablet 1,000 mg  1,000 mg Oral BID WC Bobbitt, Shalon E, NP   1,000 mg at 12/27/21 1827   Muscle Rub CREA   Topical PRN Rosezetta Schlatter, MD   1 Application at 48/54/62 0953   nicotine (NICODERM CQ - dosed in mg/24 hours) patch 21 mg  21 mg Transdermal Daily Rosezetta Schlatter,  MD   21 mg at 12/27/21 0753   pantoprazole (PROTONIX) EC tablet 40 mg  40 mg Oral Daily Nwoko, Herbert Pun I, NP   40 mg at 12/27/21 0751   polyethylene glycol (MIRALAX / GLYCOLAX) packet 17 g  17 g Oral Daily PRN Nelda Marseille, Jae Bruck E, MD       sodium chloride (OCEAN) 0.65 % nasal spray 1 spray  1 spray Each Nare PRN Rosezetta Schlatter, MD   1 spray at 12/25/21 0834   temazepam (RESTORIL) capsule 30 mg  30 mg Oral QHS PRN Harlow Asa, MD   30 mg at 12/27/21 2053   traZODone (DESYREL) tablet 100 mg  100 mg Oral QHS PRN Harlow Asa, MD   100 mg at 12/27/21 2053    Lab Results:  Results for orders placed or performed during the hospital encounter of 12/07/21 (from the past 48 hour(s))  Valproic acid level     Status: Abnormal   Collection Time: 12/26/21  6:37 PM  Result Value Ref Range   Valproic Acid Lvl 40 (L) 50.0 - 100.0 ug/mL    Comment: Performed at [facility], [place] [address] [place], [place] [address]  CK     Status: None   Collection Time: 12/27/21  6:16 PM  Result Value Ref Range   Total CK 173 49 - 397 U/L    Comment: Performed at [facility], [place] [address], [place], [place] [address]    Blood Alcohol level:  Lab Results  Component Value Date   [facility] <10 11/28/2021   ETH <10 82/99/3716    Metabolic Disorder Labs: Lab Results  Component Value Date   HGBA1C 6.9 (H) 11/17/2021   MPG 151.33 11/17/2021   MPG 111.15 02/03/2020   Lab Results  Component Value Date   PROLACTIN 8.1 06/01/2019   Lab Results  Component Value Date   CHOL 130 12/01/2021   TRIG 172 (H) 12/01/2021   HDL 28 (L) 12/01/2021   CHOLHDL 4.6 12/01/2021  VLDL 34 12/01/2021   LDLCALC 68 12/01/2021   LDLCALC 97 02/03/2020    Physical Findings:  Musculoskeletal: Strength & Muscle Tone: untested Gait & Station: normal Patient leans: N/A  Psychiatric Specialty Exam:  Presentation  General Appearance: Casually dressed, adequate hygiene   Eye  Contact:Fair   Speech:clear, coherent, less rambling and less verbose   Speech Volume:Normal  Mood and Affect  Mood:described as good - appears euthymic   Affect:polite, calm    Thought Process  Thought Processes:Tangential - less disorganized with only occasional derailment  Orientation:Oriented to month, date, self, year and city and place   Thought Content: is not responding to internal stimuli during exam; denies VH, thought insertion/withdrawal/broadcasting, ideas of reference or paranoia; admits to belief that he hears God and communicates with God through his mind  History of Schizophrenia/Schizoaffective disorder:Yes   Duration of Psychotic Symptoms:Greater than six months   Hallucinations:questionable AH - report of talking and hearing from God through his thoughts/mind; denies VH   Ideas of Reference:None   Suicidal Thoughts:Denied   Homicidal Thoughts:Denied    Sensorium  Memory: Fair  Judgment:Fair - complying with meds   Insight:Shallow    Executive Functions  Concentration: Fair   Attention Span: [place]   Language:Good    Psychomotor Activity  Psychomotor Activity:No restlessness, tremor, or akathisias noted ; no cogwheeling, stiffness and AIMS 0    Assets  Assets:Desire for Improvement; Resilience; Social Support   Physical Exam Vitals and nursing note reviewed.  HENT:     Head: Normocephalic and atraumatic.  Musculoskeletal:     Comments: No LE edema or pain to BLE with palpation  Skin:    General: Skin is warm and dry.  Neurological:     General: No focal deficit present.     Mental Status: He is alert.    Review of Systems  Respiratory:  Negative for shortness of breath.   Cardiovascular:  Negative for chest pain and palpitations.  Gastrointestinal: Negative.  Negative for abdominal pain, constipation, diarrhea, nausea and vomiting.  Genitourinary: Negative.  Negative  for dysuria, frequency and urgency.  Musculoskeletal: Negative.   Neurological:  Negative for dizziness and headaches.   Blood pressure 127/82, pulse 96, temperature 98 F (36.7 C), temperature source Oral, resp. rate 18, height 6' 1" (1.854 m), weight 117.9 kg, SpO2 99 %. Body mass index is 34.3 kg/m.   Treatment Plan Summary: ASSESSMENT: Principal Problem:   Schizoaffective disorder, bipolar type ([place]) Active Problems: Tobacco use disorder Type 2 DM Hyperlipidemia  PLAN: Safety and Monitoring:             -- Involuntary admission to inpatient psychiatric unit for safety, stabilization and treatment             -- Daily contact with patient to assess and evaluate symptoms and progress in treatment             -- Patient's case to be discussed in multi-disciplinary team meeting             -- Observation Level : q15 minute checks             -- Vital signs:  q12 hours             -- Precautions: suicide, elopement, and assault   2. Psychiatric Diagnoses and Treatment:  # Schizoaffective disorder-   bipolar type -- Discontinued Zyprexa  -- Continue Haldol 5 mg every 12 hours -- Patient  currently on Abilify Maintena 400 mg LAI, last given 8/16 -- Continue clozapine up titration for schizophrenia to 125mg qhs tonight   Weekly labs/EKG: CRP 0.7, Troponin I 3, WBC 13.4 and ANC 9200 on 8/29; QTC 429ms on 8/29 -due for repeat on 9/5 --Continue Depakote 500 mg bid - for mood stabilization  (VPA level 40 8/30) - not currently manic or agitated so will hold on further dose titration at this time given previous issues with elevated ammonia at higher dose -- Metabolic profile and EKG monitoring obtained while on antipsychotics  BMI: 34.3 Lipid Panel: WNL other than TG 172 HbgA1c: 6.9% QTc: 413 and on repeat 429ms -- Encouraged patient to participate in unit milieu and in scheduled group therapies  -- Checked total CK (173) given vague c/o related to leg heaviness- currently afebrile and  VSS; continue to monitor with Clozaril titration  -- D/c Cogentin with dose titration up on Clozaril which is more anticholinergic due to c/o sedation and monitor for EPS -- Scheduled Colace 100mg daily with dose titration up on Clozaril and monitoring bowel function     3. Medical Issues Being Addressed: #Nicotine use disorder - Nicotine patch 21 mg daily   #T2DM -- Due to the fact that patient is actively psychotic, only completing a.m. FSBS.  Patient on home regimen and diabetes well-controlled (A1c 6.9%), so minimal concern for decompensation. - Continue Metformin 1000 mg twice daily with meals - Continue Jardiance 10 mg every morning - A.m. CBG monitoring only (today pending)    #Hyperlipidemia - Continue home atorvastatin 40 mg daily  #Tobacco use d/o - Nicotine patch 21mg/24 hours  #Acid reflux.  - Continue Protonix 40 mg po Q am   4. Discharge Planning:               -- Social work and case management to assist with discharge planning and identification of hospital follow-up needs prior to discharge             -- Discharge Concerns: Need to establish a safety plan; Medication compliance and effectiveness             -- Discharge Goals: Return home with outpatient referrals for mental health follow-up including medication management/psychotherapy    E , MD, FAPA 12/28/2021, 7:04 AM    

## 2021-12-28 NOTE — [facility] Group Notes (Signed)
[facility] Group Notes:  (Nursing/MHT/Case Management/Adjunct)  Date:  12/28/2021  Time:  10:55 AM  Type of Therapy:   Orientation/Goals group  Participation Level:  Active  Participation Quality:  Appropriate  Affect:  Appropriate  Cognitive:  Disorganized, Confused, and Delusional  Insight:  Improving  Engagement in Group:  Engaged and Improving  Modes of Intervention:  Discussion, Education, Orientation, and Support  Summary of Progress/Problems: Pt goal for today is to see when he can go home.   Jalayna Josten J Quinesha Selinger 12/28/2021, 10:55 AM

## 2021-12-28 NOTE — Progress Notes (Signed)
   12/28/21 2100  Psych Admission Type (Psych Patients Only)  Admission Status Involuntary  Psychosocial Assessment  Patient Complaints Hyperactivity  Eye Contact Brief  Facial Expression Animated  Affect Preoccupied  Speech Pressured  Interaction Assertive  Motor Activity Pacing  Appearance/Hygiene Unremarkable  Behavior Characteristics Cooperative  Mood Preoccupied;Pleasant  Aggressive Behavior  Effect No apparent injury  Thought Process  Coherency Disorganized;Flight of ideas;Loose associations  Content Delusions;Confabulation  Delusions Grandeur  Perception Hallucinations  Hallucination Auditory;Visual  Judgment Limited  Confusion None  Danger to Self  Current suicidal ideation? Denies  Danger to Others  Danger to Others None reported or observed

## 2021-12-28 NOTE — Progress Notes (Signed)
Adult Psychoeducational Group Note  Date:  12/28/2021 Time:  9:11 PM  Group Topic/Focus:  Wrap-Up Group:   The focus of this group is to help patients review their daily goal of treatment and discuss progress on daily workbooks.  Participation Level:  Minimal  Participation Quality:  Inattentive  Affect:  Appropriate  Cognitive:  Appropriate  Insight: Appropriate  Engagement in Group:  Engaged  Modes of Intervention:  Education  Additional Comments:   Pt attended and participated in the Wrap-Up group. Pt denies any SI/Hi or thoughts of self-harm. Pt rated his day/mood 10/10 despite no making any progress toward his goal of discharge.Pt walks and prays to improve coping.  [name] 12/28/2021, 9:11 PM

## 2021-12-28 NOTE — [facility] Group Notes (Signed)
Spirituality group facilitated by Kathleen Argue, B[facility].   Group Description: Group focused on topic of hope. Patients participated in facilitated discussion around topic, connecting with one another around experiences and definitions for hope. Group members engaged with visual explorer photos, reflecting on what hope looks like for them today. Group engaged in discussion around how their definitions of hope are present today in hospital.   Modalities: Psycho-social ed, Adlerian, Narrative, MI   Patient Progress: [name] attended group but fell asleep soon after group began.  [address] [facility] Pager, [phone]

## 2021-12-28 NOTE — Group Note (Signed)
Recreation Therapy Group Note   Group Topic:Self-Esteem  Group Date: 12/28/2021 Start Time: 1015 End Time: 1050 Facilitators: Caroll Rancher, LRT,CTRS Location: [address] Day[address]   Goal Area(s) Addresses:  Patient will successfully identify positive attributes about themselves.  Patient will identify healthy ways to increase self-esteem. Patient will acknowledge benefit(s) of improved self-esteem.   Group Description:  Self Retrospective.  Patients were to envision talking to their younger selves. Patients were to then express, based on the things they had learned thus far in life, what advice they would give their younger selves to keep them from making some of the decisions they have made as an adult.   Affect/Mood: Appropriate   Participation Level: Minimal   Participation Quality: Independent   Behavior: Disinterested   Speech/Thought Process: Distracted and Delusional   Insight: Lacking   Judgement: Lacking    Modes of Intervention: Writing   Patient Response to Interventions:  Disengaged   Education Outcome:  Acknowledges education and In group clarification offered    Clinical Observations/Individualized Feedback: Pt was unable to focus on activity.  Pt was still talking about Spiderman and was unable to follow along with the assignment.    Plan: Continue to engage patient in RT group sessions 2-3x/week.   Caroll Rancher, LRT,CTRS 12/28/2021 12:51 PM

## 2021-12-29 DIAGNOSIS — F250 Schizoaffective disorder, bipolar type: Secondary | ICD-10-CM | POA: Diagnosis not present

## 2021-12-29 MED ORDER — SENNOSIDES-DOCUSATE SODIUM 8.6-50 MG PO TABS
1.0000 | ORAL_TABLET | Freq: Every day | ORAL | Status: DC
  Administered 2021-12-30 – 2022-01-09 (×11): 1 via ORAL
  Filled 2021-12-29 (×12): qty 1

## 2021-12-29 MED ORDER — CLOZAPINE 25 MG PO TABS
125.0000 mg | ORAL_TABLET | Freq: Every day | ORAL | Status: DC
  Administered 2021-12-29: 125 mg via ORAL
  Filled 2021-12-29 (×3): qty 1

## 2021-12-29 NOTE — [facility] Group Notes (Signed)
Goals Group 12/29/2021   Group Focus: affirmation, clarity of thought, and goals/reality orientation Treatment Modality:  Psychoeducation Interventions utilized were assignment, group exercise, and support Purpose: To be able to understand and verbalize the reason for their admission to the hospital. To understand that the medication helps with their chemical imbalance but they also need to work on their choices in life. To be challenged to develop a list of 30 positives about themselves. Also introduce the concept that "feelings" are not reality.  Participation Level:  Active  Participation Quality:  inappropriate  Affect:  inappropriate  Cognitive:  inappropriate  Insight:  Ilacking  Engagement in Group:  Engaged  Additional Comments:  Pt rates his energy at a 10/10. Interrupts others frequently and talks to himself and to the voices in his head. Has flights of ideas and requires redirection  Dione Housekeeper

## 2021-12-29 NOTE — Progress Notes (Signed)
[facility] MD Progress Note  12/29/2021 8:07 AM [name]  MRN:  [id number]  Reason for Admission:  [name] is a 46 year old male with a reported past psychiatric history of schizophrenia paranoid, bipolar disorder housing insecurity who presented to [facility] ED on 8/3 under IVC for psychosis and bizarre behaviors.  The patient is currently on Hospital Day 22.   Chart Review from last 24 hours:  The patient's chart was reviewed and nursing notes were reviewed. The patient's case was discussed in multidisciplinary team meeting. Per nursing he remained disorganized, seen talking to himself, but pleasant. He attended groups. He had not acute behavioral issues noted. Per [facility] he was compliant with scheduled medications except Colace. He received Advil X1, and Vistaril X1, Melatonin X1, Restoril X1 and Trazodone X1 for sleep.  Yesterday's Recommendations per Psychiatry Team: -Continue Haldol 5 mg every 12 hours - Patient currently on Abilify Maintena 400 mg LAI, last given 8/16 - Continue clozapine up titration for schizophrenia to 125mg  qhs tonight    -Continue Depakote 500 mg bid - for mood stabilization (VPA level 40 8/30) - not currently manic or agitated so will hold on further dose titration at this time given previous issues with elevated ammonia at higher dose - Continue metformin 1000 mg twice daily with meals - Continue Jardiance 10 mg every morning  Continue Protonix 40 mg po Q am  - Continue home atorvastatin 40 mg daily - Nicotine patch 21 mg daily -- D/c Cogentin with dose titration up on Clozaril which is more anticholinergic due to c/o sedation and monitor for EPS - Schedule Colace 100mg  daily with dose titration up on Clozaril  On Evaluation Today: The patient was seen and evaluated on the unit, initially lying in bed sleeping after breakfast time. He states he slept well overnight but feels more tired than usual this morning, stating "I believe the medication is too strong  now; I do not need any more of those sleeping medications." He reports good appetite.  He has the physical complaint of leg heaviness, particularly when he first wakes.  He denies auditory and visual hallucinations today, as well as suicidal and homicidal ideation. He endorses increased nocturnal urinary frequency and over sedation, but denies all other symptoms that are potential adverse effects of Clozaril specifically constipation, drooling, enuresis, dizziness, sore throat, palpitations, CP or SOB.  He reports bowel movement last night. He states his mood is "good" and overall he is pleasant and continues to be more linear on exam today. He does not need as frequent an interruption during interview and does not derail into delusional topics as frequently. He does discuss his ex-girlfriends in middle and high school, and how he believes that he had bad karma for "wishing that Levan Hurst would injure himself when he proposed to his wife with a $4 million ring when patient could only propose to singer Deatra Canter with a $1 million ring."  As well, he believes that his mother began to "become jealous" after that ring purchase, leading to his very first psychiatric admission.  He denies VH, ideas of reference, paranoia, or belief in thought broadcasting/insertion/withdrawal today. He denies SI or HI. He derails into discussion about belief that "the UFOs saved the helicopter in which Levan Hurst was traveling; they are not dead."  When asked orientation questions he is oriented X3.   Principal Problem: Schizoaffective disorder, bipolar type ([name]) Diagnosis: Principal Problem:   Schizoaffective disorder, bipolar type ([name]) Active Problems:   Tobacco use  disorder   Diabetes (HCC)   Hyperlipidemia   Total Time Spent in Direct Patient Care:  I personally spent 30 minutes on the unit in direct patient care. The direct patient care time included face-to-face time with the patient, reviewing the patient's chart,  communicating with other professionals, and coordinating care. Greater than 50% of this time was spent in counseling or coordinating care with the patient regarding goals of hospitalization, psycho-education, and discharge planning needs.  Past Psychiatric History: See H&P  Past Medical History:  Past Medical History:  Diagnosis Date   Bipolar disorder (HCC)    Bronchitis    Hyperlipidemia    Paranoid schizophrenia (HCC)    Schizophrenia (HCC)     Past Surgical History:  Procedure Laterality Date   TESTICLE IMPLANTATION TO THIGH     TESTICLE SURGERY     Family History:  Family History  Problem Relation Age of Onset   Schizophrenia Neg Hx    Family Psychiatric  History: See H&P  Social History:  Social History   Substance and Sexual Activity  Alcohol Use Not Currently   Comment: weekly     Social History   Substance and Sexual Activity  Drug Use Yes   Types: Marijuana   Comment: smokes hemp    Social History   Socioeconomic History   Marital status: Single    Spouse name: Not on file   Number of children: Not on file   Years of education: 12 years   Highest education level: 12th grade  Occupational History   Occupation: On disability  Tobacco Use   Smoking status: Every Day    Packs/day: 1.00    Types: Cigarettes   Smokeless tobacco: Never  Vaping Use   Vaping Use: Never used  Substance and Sexual Activity   Alcohol use: Not Currently    Comment: weekly   Drug use: Yes    Types: Marijuana    Comment: smokes hemp   Sexual activity: Yes    Birth control/protection: Condom  Other Topics Concern   Not on file  Social History Narrative   Pt lives alone in apartment complex for mentally ill   Social Determinants of Health   Financial Resource Strain: Not on file  Food Insecurity: Not on file  Transportation Needs: Not on file  Physical Activity: Not on file  Stress: Not on file  Social Connections: Not on file   Sleep: Good  Appetite:   Good  Current Medications: Current Facility-Administered Medications  Medication Dose Route Frequency Provider Last Rate Last Admin   acetaminophen (TYLENOL) tablet 650 mg  650 mg Oral Q6H PRN Lamar Sprinkles, MD       alum & mag hydroxide-simeth (MAALOX/MYLANTA) 200-200-20 MG/5ML suspension 30 mL  30 mL Oral Q4H PRN Bobbitt, Shalon E, NP   30 mL at 12/20/21 1135   atorvastatin (LIPITOR) tablet 40 mg  40 mg Oral QHS Bobbitt, Shalon E, NP   40 mg at 12/28/21 2049   benztropine (COGENTIN) tablet 0.5 mg  0.5 mg Oral BID PRN Comer Locket, MD       divalproex (DEPAKOTE) DR tablet 500 mg  500 mg Oral Q12H Massengill, Nathan, MD   500 mg at 12/28/21 2049   docusate sodium (COLACE) capsule 100 mg  100 mg Oral Daily Mason Jim, Amy E, MD   100 mg at 12/28/21 0819   empagliflozin (JARDIANCE) tablet 10 mg  10 mg Oral Daily Lamar Sprinkles, MD   10 mg at 12/28/21 1010  haloperidol (HALDOL) tablet 5 mg  5 mg Oral Q12H Massengill, Nathan, MD   5 mg at 12/28/21 2049   haloperidol (HALDOL) tablet 5 mg  5 mg Oral Q8H PRN Massengill, Harrold Donath, MD       And   LORazepam (ATIVAN) tablet 1 mg  1 mg Oral Q8H PRN Massengill, Nathan, MD       haloperidol lactate (HALDOL) injection 5 mg  5 mg Intramuscular Q8H PRN Massengill, Harrold Donath, MD       And   LORazepam (ATIVAN) injection 1 mg  1 mg Intramuscular Q8H PRN Massengill, Harrold Donath, MD       hydrOXYzine (ATARAX) tablet 25 mg  25 mg Oral TID PRN Bobbitt, Shalon E, NP   25 mg at 12/28/21 2049   ibuprofen (ADVIL) tablet 800 mg  800 mg Oral BID PRN Comer Locket, MD   800 mg at 12/28/21 3710   magnesium hydroxide (MILK OF MAGNESIA) suspension 30 mL  30 mL Oral Daily PRN Bobbitt, Shalon E, NP   30 mL at 12/10/21 2029   melatonin tablet 5 mg  5 mg Oral QHS PRN Massengill, Harrold Donath, MD   5 mg at 12/28/21 2049   metFORMIN (GLUCOPHAGE) tablet 1,000 mg  1,000 mg Oral BID WC Bobbitt, Shalon E, NP   1,000 mg at 12/28/21 1711   Muscle Rub CREA   Topical PRN Lamar Sprinkles, MD   1  Application at 12/25/21 0953   nicotine (NICODERM CQ - dosed in mg/24 hours) patch 21 mg  21 mg Transdermal Daily Lamar Sprinkles, MD   21 mg at 12/28/21 0821   pantoprazole (PROTONIX) EC tablet 40 mg  40 mg Oral Daily Nwoko, Nicole Kindred I, NP   40 mg at 12/28/21 0819   polyethylene glycol (MIRALAX / GLYCOLAX) packet 17 g  17 g Oral Daily PRN Mason Jim, Amy E, MD       sodium chloride (OCEAN) 0.65 % nasal spray 1 spray  1 spray Each Nare PRN Lamar Sprinkles, MD   1 spray at 12/25/21 0834   temazepam (RESTORIL) capsule 30 mg  30 mg Oral QHS PRN Comer Locket, MD   30 mg at 12/28/21 2049   traZODone (DESYREL) tablet 100 mg  100 mg Oral QHS PRN Comer Locket, MD   100 mg at 12/28/21 2049    Lab Results:  Results for orders placed or performed during the hospital encounter of 12/07/21 (from the past 48 hour(s))  CK     Status: None   Collection Time: 12/27/21  6:16 PM  Result Value Ref Range   Total CK 173 49 - 397 U/L    Comment: Performed at [facility], [address], [place], [place] [address]    Blood Alcohol level:  Lab Results  Component Value Date   ETH <10 11/28/2021   ETH <10 11/23/2021    Metabolic Disorder Labs: Lab Results  Component Value Date   HGBA1C 6.9 (H) 11/17/2021   MPG 151.33 11/17/2021   MPG 111.15 02/03/2020   Lab Results  Component Value Date   PROLACTIN 8.1 06/01/2019   Lab Results  Component Value Date   CHOL 130 12/01/2021   TRIG 172 (H) 12/01/2021   HDL 28 (L) 12/01/2021   CHOLHDL 4.6 12/01/2021   VLDL 34 12/01/2021   LDLCALC 68 12/01/2021   LDLCALC 97 02/03/2020    Physical Findings:  Musculoskeletal: Strength & Muscle Tone: untested Gait & Station: normal Patient leans: N/A  Psychiatric Specialty  Exam:  Presentation  General Appearance: Casually dressed, adequate hygiene, lots of oil in hair   Eye Contact: Good   Speech:clear, coherent, less rambling and less verbose   Speech Volume:Normal  Mood and  Affect  Mood:described as good - appears euthymic   Affect:polite, calm    Thought Process  Thought Processes:Tangential - less disorganized with only occasional derailment  Orientation:Oriented to month, date, self, year and city and place   Thought Content: is not responding to internal stimuli during exam; denies VH, thought insertion/withdrawal/broadcasting, ideas of reference or paranoia  History of Schizophrenia/Schizoaffective disorder:Yes   Duration of Psychotic Symptoms:Greater than six months   Hallucinations:denies AVH   Ideas of Reference:None   Suicidal Thoughts:Denied   Homicidal Thoughts:Denied    Sensorium  Memory: Fair  Judgment:Fair - complying with meds   Insight:Shallow    Executive Functions  Concentration: Fair   Attention Span: [place]   Language:Good    Psychomotor Activity  Psychomotor Activity:No restlessness, tremor, or akathisias noted ; no cogwheeling, stiffness and AIMS 0    Assets  Assets:Desire for Improvement; Resilience; Social Support  Sleep: 7.25 hours  Physical Exam Vitals and nursing note reviewed.  HENT:     Head: Normocephalic and atraumatic.  Musculoskeletal:     Comments: No LE edema or pain to BLE with palpation  Skin:    General: Skin is warm and dry.  Neurological:     General: No focal deficit present.     Mental Status: He is alert.   Review of Systems  Respiratory:  Negative for shortness of breath.   Cardiovascular:  Negative for chest pain and palpitations.  Gastrointestinal: Negative.  Negative for abdominal pain, constipation, diarrhea, nausea and vomiting.  Genitourinary: Negative.  Negative for dysuria, frequency and urgency.  Musculoskeletal: Negative.   Neurological:  Negative for dizziness and headaches.   Blood pressure (!) 118/90, pulse 95, temperature 98 F (36.7 C), temperature source Oral, resp. rate 16, height 6\' 1"  (1.854 m),  weight 117.9 kg, SpO2 100 %. Body mass index is 34.3 kg/m.   Treatment Plan Summary: ASSESSMENT: Principal Problem:   Schizoaffective disorder, bipolar type ([place]) Active Problems: Tobacco use disorder Type 2 DM Hyperlipidemia  PLAN: Safety and Monitoring:             -- Involuntary admission to inpatient psychiatric unit for safety, stabilization and treatment             -- Daily contact with patient to assess and evaluate symptoms and progress in treatment             -- Patient's case to be discussed in multi-disciplinary team meeting             -- Observation Level : q15 minute checks             -- Vital signs:  q12 hours             -- Precautions: suicide, elopement, and assault   2. Psychiatric Diagnoses and Treatment:  # Schizoaffective disorder-   bipolar type -- Discontinued Zyprexa  -- Continue Haldol 5 mg every 12 hours; will consider decreasing a.m. dose if patient continues to have increased daytime somnolence -- Patient currently on Abilify Maintena 400 mg LAI, last given 8/16 -- Hold clozapine up titration for schizophrenia, continuing 125mg  qhs tonight due to decreased energy and increased lethargy  Weekly labs/EKG: CRP 0.7, Troponin I 3, WBC 13.4 and ANC  9200 on 8/29; QTC 467ms on 8/29 -due for repeat on 9/5 --Continue Depakote 500 mg bid - for mood stabilization  (VPA level 40 8/30) - not currently manic or agitated so will hold on further dose titration at this time given previous issues with elevated ammonia at higher dose -- Metabolic profile and EKG monitoring obtained while on antipsychotics  BMI: 34.3 Lipid Panel: WNL other than TG 172 HbgA1c: 6.9% QTc: 413 and on repeat 429ms -- Encouraged patient to participate in unit milieu and in scheduled group therapies  -- Checked total CK (173) given vague c/o related to leg heaviness- currently afebrile and VSS; continue to monitor with Clozaril titration  -- D/c Cogentin with dose titration up on Clozaril  which is more anticholinergic due to c/o sedation and monitor for EPS -- Scheduled Senna 8.6 daily with dose titration up on Clozaril and monitoring bowel function     3. Medical Issues Being Addressed: #Nicotine use disorder - Nicotine patch 21 mg daily   #T2DM -- Due to the fact that patient is actively psychotic, only completing a.m. FSBS; will resume.  Patient on home regimen and diabetes well-controlled (A1c 6.9%), so minimal concern for decompensation. - Continue Metformin 1000 mg twice daily with meals - Continue Jardiance 10 mg every morning - A.m. CBG monitoring only   #Hyperlipidemia - Continue home atorvastatin 40 mg daily  #Tobacco use d/o - Nicotine patch 21mg /24 hours  #Acid reflux.  - Continue Protonix 40 mg po Q am   4. Discharge Planning:               -- Social work and case management to assist with discharge planning and identification of hospital follow-up needs prior to discharge             -- Discharge Concerns: Need to establish a safety plan; Medication compliance and effectiveness             -- Discharge Goals: Return home with outpatient referrals for mental health follow-up including medication management/psychotherapy   Rosezetta Schlatter, MD, [place] 12/29/2021, 8:07 AM

## 2021-12-29 NOTE — Progress Notes (Signed)
   12/29/21 0500  Sleep  Number of Hours 7.25

## 2021-12-29 NOTE — Group Note (Signed)
LCSW Group Therapy Note 12/29/2021  11:00am - 12:00pm  Type of Therapy and Topic:  Group Therapy: Anger and Powerlessness  Participation Level:  Active   Description of Group: In this group, patients identified a frequent cause of their anger and a frequent reaction.  They analyzed how their reaction was possibly beneficial and how it was possibly unhelpful.  Their commonalities with other patients was pointed out and discussed.  It was also discussed that feelings of powerlessness are often present in the situations described, and that they have this feeling in common with other people as well.  The bulk of group time was spent talking about how they can take back their power in small but significant ways that feels empowering.  Therapeutic Goals: Patients will describe a frequent cause of anger, plus other emotions that are felt along with the anger Patients will identify their typical manner of reacting to this anger Patients will be able to acknowledge the common theme of powerlessness among the group members Patients will consider ways to take back their power where and when they are able  Summary of Patient Progress:  The patient shared that one of his frequent causes of anger is people spreading rumors or lies about him that he can do nothing about, especially the way in which he feels his mother "lies to throw me in the hospital."  His typical reaction is to just sink into his feelings  of hopelessness.    Therapeutic Modalities:   Processing Brief Solution-Focused Therapy  Lynnell Chad, LCSW 12/29/2021  1:33 PM

## 2021-12-29 NOTE — Progress Notes (Signed)
Adult Psychoeducational Group Note  Date:  12/29/2021 Time:  8:42 PM  Group Topic/Focus:  Wrap-Up Group:   The focus of this group is to help patients review their daily goal of treatment and discuss progress on daily workbooks.  Participation Level:  Active  Participation Quality:  Appropriate  Affect:  Appropriate  Cognitive:  Appropriate  Insight: Appropriate  Engagement in Group:  Improving  Modes of Intervention:  Discussion  Additional Comments:   Pt stated his goal for today was to focus on his treatment plan. Pt stated he accomplished his goal today. Pt stated he talked with his doctor and social worker about his care today. Pt rated his overall day a 10.  Pt stated he was able to contact his parents today which improved his overall day. Pt stated he felt better about himself today. Pt stated he was able to attend all meals. Pt stated he took all medications provided today. Pt stated he attend all groups held today. Pt stated his appetite was pretty good today. Pt rated sleep last night was pretty good. Pt stated the goal tonight was to get some rest. Pt stated he had some physical pain tonight. Pt stated he had some mild pain in his right knee tonight. Pt rated the mild pain in his right knee a 2 on the pain level scale. Pt nurse was updated on the situation. Pt deny visual hallucinations and auditory issues tonight.  Pt denies thoughts of harming himself or others. Pt stated he would alert staff if anything changed  Felipa Furnace 12/29/2021, 8:42 PM

## 2021-12-30 DIAGNOSIS — F250 Schizoaffective disorder, bipolar type: Secondary | ICD-10-CM | POA: Diagnosis not present

## 2021-12-30 LAB — GLUCOSE, CAPILLARY: Glucose-Capillary: 108 mg/dL — ABNORMAL HIGH (ref 70–99)

## 2021-12-30 MED ORDER — CLOZAPINE 25 MG PO TABS
137.5000 mg | ORAL_TABLET | Freq: Every day | ORAL | Status: AC
  Administered 2021-12-30: 137.5 mg via ORAL
  Filled 2021-12-30 (×2): qty 2

## 2021-12-30 MED ORDER — CLOZAPINE 25 MG PO TABS
150.0000 mg | ORAL_TABLET | Freq: Every day | ORAL | Status: DC
  Administered 2021-12-31 – 2022-01-08 (×9): 150 mg via ORAL
  Filled 2021-12-30 (×10): qty 2

## 2021-12-30 NOTE — Group Note (Signed)
[facility] LCSW Group Therapy Note  Date/Time:  12/30/2021  11:00AM-12:00PM  Type of Therapy and Topic:  Group Therapy:  Music and Mood  Participation Level:  Active   Description of Group: In this process group, members listened to a variety of genres of music and identified that different types of music evoke different responses.  Patients were encouraged to identify music that was soothing for them and music that was energizing for them.  Patients discussed how this knowledge can help with wellness and recovery in various ways including managing depression and anxiety as well as encouraging healthy sleep habits.    Therapeutic Goals: Patients will explore the impact of different varieties of music on mood Patients will verbalize the thoughts they have when listening to different types of music Patients will identify music that is soothing to them as well as music that is energizing to them Patients will discuss how to use this knowledge to assist in maintaining wellness and recovery Patients will explore the use of music as a coping skill  Summary of Patient Progress:  At the beginning of group, patient expressed his mood was good.  At the end of group, patient expressed his mood was still good.  He stated after discharge he can listen to new music and watch music videos to "get ideas for [employer] and to make life more enjoyable.  I can use it for everything.  If I'd had it, I would not be here right now."    Therapeutic Modalities: Solution Focused Brief Therapy Activity   Ambrose Mantle, LCSW

## 2021-12-30 NOTE — Progress Notes (Signed)
   12/29/21 2115  Psych Admission Type (Psych Patients Only)  Admission Status Involuntary  Psychosocial Assessment  Patient Complaints Anxiety  Eye Contact Fair  Facial Expression Animated  Affect Appropriate to circumstance  Speech Logical/coherent;Pressured  Interaction Assertive  Motor Activity Fidgety;Restless  Appearance/Hygiene Unremarkable  Behavior Characteristics Cooperative;Appropriate to situation  Mood Pleasant  Thought Process  Coherency Disorganized;Flight of ideas  Content Delusions  Delusions Grandeur  Perception Hallucinations  Hallucination Auditory;Visual  Judgment Limited  Confusion None  Danger to Self  Current suicidal ideation? Denies  Danger to Others  Danger to Others None reported or observed

## 2021-12-30 NOTE — Plan of Care (Signed)
  Problem: Clinical Measurements: Goal: Diagnostic test results will improve Outcome: Progressing Goal: Signs and symptoms of infection will decrease Outcome: Progressing   Problem: Activity: Goal: Will verbalize the importance of balancing activity with adequate rest periods Outcome: Progressing   Problem: Education: Goal: Will be free of psychotic symptoms Outcome: Progressing Goal: Knowledge of the prescribed therapeutic regimen will improve Outcome: Progressing

## 2021-12-30 NOTE — Progress Notes (Addendum)
[facility] MD Progress Note  12/30/2021 9:00 AM [name]  MRN:  [id number]  Reason for Admission:  [name] is a 46 year old male with a reported past psychiatric history of schizophrenia paranoid, bipolar disorder housing insecurity who presented to [facility] ED on 8/3 under IVC for psychosis and bizarre behaviors.  The patient is currently on Hospital Day 23.   Chart Review from last 24 hours:  The patient's chart was reviewed and nursing notes were reviewed. The patient's case was discussed in multidisciplinary team meeting. Per nursing he remained disorganized, seen talking to himself, but pleasant. He attended groups. He had not acute behavioral issues noted. Per [facility] he was compliant with scheduled medications except Colace. He received Advil X1, and Vistaril X1, Melatonin X1, Restoril X1 and Trazodone X1 for sleep.  Yesterday's Recommendations per Psychiatry Team: -Continue Haldol 5 mg every 12 hours - Patient currently on Abilify Maintena 400 mg LAI, last given 8/16 - Continue clozapine up titration for schizophrenia to 125mg  qhs tonight    -Continue Depakote 500 mg bid - for mood stabilization (VPA level 40 8/30) - not currently manic or agitated so will hold on further dose titration at this time given previous issues with elevated ammonia at higher dose - Continue metformin 1000 mg twice daily with meals - Continue Jardiance 10 mg every morning  Continue Protonix 40 mg po Q am  - Continue home atorvastatin 40 mg daily - Nicotine patch 21 mg daily -- D/c Cogentin with dose titration up on Clozaril which is more anticholinergic due to c/o sedation and monitor for EPS - Schedule Colace 100mg  daily with dose titration up on Clozaril  On Evaluation Today: The patient was seen and evaluated in his room. He states his mood is "good" and overall he remains pleasant.  He states he slept well overnight and has a normal level of energy this morning.  He reports good appetite.  He denies  somatic concerns or complaints today but again reports increased nocturnal urinary frequency last night, waking to urinate 3 times. He denies all other symptoms that are potential adverse effects of Clozaril specifically constipation, drooling, enuresis, dizziness, sore throat, palpitations, CP or SOB. He is initially much more appropriate in conversation, and appeared to be closer to baseline.  He denies SI, HI, paranoia, thought insertion/withdrawal, and thought broadcasting.  He is oriented x 3.  As the assessment progresses, patient begins to disclose that he believes that he has a camera in his air conditioner in the room, as he has a similar unit in his apartment in which there is a camera that he covers.  Additionally, he reports that the air conditioner is "humming now but will get riled up when you leave."  He says that a few days ago, he saw his brother who told him he was "ready to be resurrected.  I just did not say anything to you all then."  He then goes to the window and looks outside, stating that he sometimes sees the shadow of [name]'s face in the grass when the sun is in the [place], "but that may just be a coincidence."  He also reports that he cried in the bathroom, and when the tears fell he was able to see Spider-Man in them.  While patient was sometimes able to rationalize, he did note that "I only say when I can see and hear things while I am in the psychiatric hospital.  I do not say these things when I  am in the real world or people will think that I am crazy."    Principal Problem: Schizoaffective disorder, bipolar type ([place]) Diagnosis: Principal Problem:   Schizoaffective disorder, bipolar type ([place]) Active Problems:   Tobacco use disorder   Diabetes ([place])   Hyperlipidemia   Total Time Spent in Direct Patient Care:  I personally spent 30 minutes on the unit in direct patient care. The direct patient care time included face-to-face time with the patient, reviewing  the patient's chart, communicating with other professionals, and coordinating care. Greater than 50% of this time was spent in counseling or coordinating care with the patient regarding goals of hospitalization, psycho-education, and discharge planning needs.  Past Psychiatric History: See H&P  Past Medical History:  Past Medical History:  Diagnosis Date   Bipolar disorder ([place])    Bronchitis    Hyperlipidemia    Paranoid schizophrenia ([place])    Schizophrenia ([place])     Past Surgical History:  Procedure Laterality Date   TESTICLE IMPLANTATION TO THIGH     TESTICLE SURGERY     Family History:  Family History  Problem Relation Age of Onset   Schizophrenia Neg Hx    Family Psychiatric  History: See H&P  Social History:  Social History   Substance and Sexual Activity  Alcohol Use Not Currently   Comment: weekly     Social History   Substance and Sexual Activity  Drug Use Yes   Types: Marijuana   Comment: smokes hemp    Social History   Socioeconomic History   Marital status: Single    Spouse name: Not on file   Number of children: Not on file   Years of education: 12 years   Highest education level: 12th grade  Occupational History   Occupation: On disability  Tobacco Use   Smoking status: Every Day    Packs/day: 1.00    Types: Cigarettes   Smokeless tobacco: Never  Vaping Use   Vaping Use: Never used  Substance and Sexual Activity   Alcohol use: Not Currently    Comment: weekly   Drug use: Yes    Types: Marijuana    Comment: smokes hemp   Sexual activity: Yes    Birth control/protection: Condom  Other Topics Concern   Not on file  Social History Narrative   Pt lives alone in apartment complex for mentally ill   Social Determinants of Health   Financial Resource Strain: Not on file  Food Insecurity: Not on file  Transportation Needs: Not on file  Physical Activity: Not on file  Stress: Not on file  Social Connections: Not on file   Sleep:  Good  Appetite:  Good  Current Medications: Current Facility-Administered Medications  Medication Dose Route Frequency Provider Last Rate Last Admin   acetaminophen (TYLENOL) tablet 650 mg  650 mg Oral Q6H PRN Rosezetta Schlatter, MD   650 mg at 12/29/21 2019   alum & mag hydroxide-simeth (MAALOX/MYLANTA) 200-200-20 MG/5ML suspension 30 mL  30 mL Oral Q4H PRN Bobbitt, Shalon E, NP   30 mL at 12/20/21 1135   atorvastatin (LIPITOR) tablet 40 mg  40 mg Oral QHS Bobbitt, Shalon E, NP   40 mg at 12/29/21 2105   benztropine (COGENTIN) tablet 0.5 mg  0.5 mg Oral BID PRN Harlow Asa, MD       cloZAPine (CLOZARIL) tablet 125 mg  125 mg Oral QHS Rosezetta Schlatter, MD   125 mg at 12/29/21 2105   divalproex (DEPAKOTE) DR tablet  500 mg  500 mg Oral Q12H Massengill, Nathan, MD   500 mg at 12/30/21 0759   empagliflozin (JARDIANCE) tablet 10 mg  10 mg Oral Daily Rosezetta Schlatter, MD   10 mg at 12/30/21 0759   haloperidol (HALDOL) tablet 5 mg  5 mg Oral Q12H Massengill, Ovid Curd, MD   5 mg at 12/30/21 0759   haloperidol (HALDOL) tablet 5 mg  5 mg Oral Q8H PRN Massengill, Ovid Curd, MD       And   LORazepam (ATIVAN) tablet 1 mg  1 mg Oral Q8H PRN Massengill, Ovid Curd, MD       haloperidol lactate (HALDOL) injection 5 mg  5 mg Intramuscular Q8H PRN Massengill, Ovid Curd, MD       And   LORazepam (ATIVAN) injection 1 mg  1 mg Intramuscular Q8H PRN Massengill, Ovid Curd, MD       hydrOXYzine (ATARAX) tablet 25 mg  25 mg Oral TID PRN Bobbitt, Shalon E, NP   25 mg at 12/29/21 2105   ibuprofen (ADVIL) tablet 800 mg  800 mg Oral BID PRN Harlow Asa, MD   800 mg at 12/29/21 1855   magnesium hydroxide (MILK OF MAGNESIA) suspension 30 mL  30 mL Oral Daily PRN Bobbitt, Shalon E, NP   30 mL at 12/10/21 2029   melatonin tablet 5 mg  5 mg Oral QHS PRN Massengill, Ovid Curd, MD   5 mg at 12/29/21 2105   metFORMIN (GLUCOPHAGE) tablet 1,000 mg  1,000 mg Oral BID WC Bobbitt, Shalon E, NP   1,000 mg at 12/30/21 0759   Muscle Rub CREA    Topical PRN Rosezetta Schlatter, MD   1 Application at [id number] 1125   nicotine (NICODERM CQ - dosed in mg/24 hours) patch 21 mg  21 mg Transdermal Daily Rosezetta Schlatter, MD   21 mg at 12/30/21 0800   pantoprazole (PROTONIX) EC tablet 40 mg  40 mg Oral Daily Nwoko, Herbert Pun I, NP   40 mg at 12/30/21 0759   polyethylene glycol (MIRALAX / GLYCOLAX) packet 17 g  17 g Oral Daily PRN Harlow Asa, MD       senna-docusate (Senokot-S) tablet 1 tablet  1 tablet Oral Daily Rosezetta Schlatter, MD   1 tablet at 12/30/21 0759   sodium chloride (OCEAN) 0.65 % nasal spray 1 spray  1 spray Each Nare PRN Rosezetta Schlatter, MD   1 spray at 12/25/21 0834   temazepam (RESTORIL) capsule 30 mg  30 mg Oral QHS PRN Harlow Asa, MD   30 mg at 12/29/21 2105   traZODone (DESYREL) tablet 100 mg  100 mg Oral QHS PRN Harlow Asa, MD   100 mg at 12/29/21 2105    Lab Results:  Results for orders placed or performed during the hospital encounter of 12/07/21 (from the past 48 hour(s))  Glucose, capillary     Status: Abnormal   Collection Time: 12/30/21  6:09 AM  Result Value Ref Range   Glucose-Capillary 108 (H) 70 - 99 mg/dL    Comment: Glucose reference range applies only to samples taken after fasting for at least 8 hours.    Blood Alcohol level:  Lab Results  Component Value Date   [facility] <10 11/28/2021   ETH <10 [id number]    Metabolic Disorder Labs: Lab Results  Component Value Date   HGBA1C 6.9 (H) 11/17/2021   MPG 151.33 11/17/2021   MPG 111.15 02/03/2020   Lab Results  Component Value Date   PROLACTIN 8.1 06/01/2019  Lab Results  Component Value Date   CHOL 130 12/01/2021   TRIG 172 (H) 12/01/2021   HDL 28 (L) 12/01/2021   CHOLHDL 4.6 12/01/2021   VLDL 34 12/01/2021   LDLCALC 68 12/01/2021   LDLCALC 97 02/03/2020    Physical Findings:  Musculoskeletal: Strength & Muscle Tone: Normal Gait & Station: normal Patient leans: N/A  Psychiatric Specialty Exam:  Presentation  General  Appearance: Casually dressed, adequate hygiene   Eye Contact: Good   Speech:clear, coherent, initially less rambling and less verbose until he is asked about AVH   Speech Volume:Normal  Mood and Affect  Mood:described as good - appears euthymic   Affect:polite, calm    Thought Process  Thought Processes:Tangential - less disorganized with derailment only at the end of the assessment  Orientation:Oriented to month, date, self, year and city and place   Thought Content: is not responding to internal stimuli during exam; thought insertion/withdrawal/broadcasting, ideas of reference or paranoia  History of Schizophrenia/Schizoaffective disorder:Yes   Duration of Psychotic Symptoms:Greater than six months   Hallucinations: See HPI   Ideas of Reference:None   Suicidal Thoughts:Denied   Homicidal Thoughts:Denied    Sensorium  Memory: Fair  Judgment:Fair - complying with meds   Insight:Shallow    Executive Functions  Concentration: Fair   Attention Span: Fair   Recall:Fair   Fund of Knowledge:Fair   Language:Good    Psychomotor Activity  Psychomotor Activity:No restlessness, tremor, or akathisias noted ; no cogwheeling, stiffness and AIMS 0    Assets  Assets:Desire for Improvement; Resilience; Social Support  Sleep: Good  Physical Exam Vitals and nursing note reviewed.  HENT:     Head: Normocephalic and atraumatic.  Musculoskeletal:     Comments: No LE edema or pain to BLE with palpation  Skin:    General: Skin is warm and dry.  Neurological:     General: No focal deficit present.     Mental Status: He is alert.    Review of Systems  Respiratory:  Negative for shortness of breath.   Cardiovascular:  Negative for chest pain and palpitations.  Gastrointestinal: Negative.  Negative for abdominal pain, constipation, diarrhea, nausea and vomiting.  Genitourinary: Negative.  Negative for dysuria, frequency and urgency.   Musculoskeletal: Negative.   Neurological:  Negative for dizziness and headaches.   Blood pressure 127/67, pulse 95, temperature (!) 97.5 F (36.4 C), temperature source Oral, resp. rate 16, height 6\' 1"  (1.854 m), weight 117.9 kg, SpO2 100 %. Body mass index is 34.3 kg/m.   Treatment Plan Summary: ASSESSMENT: Principal Problem:   Schizoaffective disorder, bipolar type (HCC) Active Problems: Tobacco use disorder Type 2 DM Hyperlipidemia  PLAN: Safety and Monitoring:             -- Involuntary admission to inpatient psychiatric unit for safety, stabilization and treatment             -- Daily contact with patient to assess and evaluate symptoms and progress in treatment             -- Patient's case to be discussed in multi-disciplinary team meeting             -- Observation Level : q15 minute checks             -- Vital signs:  q12 hours             -- Precautions: suicide, elopement, and assault   2. Psychiatric Diagnoses and Treatment:  # Schizoaffective disorder-  bipolar type -- Discontinued Zyprexa  -- Continue Haldol 5 mg every 12 hours; will consider decreasing a.m. dose if patient continues to have increased daytime somnolence with dose titration up on Clozaril -- Patient currently on Abilify Maintena 400 mg LAI, last given 8/16 -- Continue clozapine up titration for schizophrenia, increasing to 137.5 mg tonight and to 150mg  qhs on 9/4 due to continued psychosis   Weekly labs/EKG: CRP 0.7, Troponin I 3, WBC 13.4 and ANC 9200 on 8/29; QTC 9/29 on 8/29 -due for repeat on 9/5 --Continue Depakote 500 mg bid - for mood stabilization  (VPA level 40 8/30) - not currently manic or agitated so will hold on further dose titration at this time given previous issues with elevated ammonia at higher dose -- Metabolic profile and EKG monitoring obtained while on antipsychotics  BMI: 34.3 Lipid Panel: WNL other than TG 172 HbgA1c: 6.9% QTc: 413 and on repeat 9/30 -- Encouraged  patient to participate in unit milieu and in scheduled group therapies  -- Checked total CK (173) given vague c/o related to leg heaviness- currently afebrile and VSS; continue to monitor with Clozaril titration  -- D/c Cogentin with dose titration up on Clozaril which is more anticholinergic due to c/o sedation and monitor for EPS -- Scheduled Senna 8.6 daily with dose titration up on Clozaril and monitoring bowel function     3. Medical Issues Being Addressed: #Nicotine use disorder - Nicotine patch 21 mg daily   #T2DM -- Due to the fact that patient is actively psychotic, only completing a.m. FSBS; will resume.  Patient on home regimen and diabetes well-controlled (A1c 6.9%), so minimal concern for decompensation. - Continue Metformin 1000 mg twice daily with meals - Continue Jardiance 10 mg every morning - A.m. CBG monitoring only   #Hyperlipidemia - Continue home atorvastatin 40 mg daily  #Tobacco use d/o - Nicotine patch 21mg /24 hours  #Acid reflux.  - Continue Protonix 40 mg po Q am   4. Discharge Planning:               -- Social work and case management to assist with discharge planning and identification of hospital follow-up needs prior to discharge             -- Discharge Concerns: Need to establish a safety plan; Medication compliance and effectiveness             -- Discharge Goals: Return home with outpatient referrals for mental health follow-up including medication management/psychotherapy   , MD 12/30/2021, 9:01 AM

## 2021-12-30 NOTE — [facility] Group Notes (Signed)
Adult Psychoeducational Group Note Date:  12/29/2021 Time:  0900-1045 Group Topic/Focus: PROGRESSIVE RELAXATION. A group where deep breathing is taught and tensing and relaxation muscle groups is used. Imagery is used as well.  Pts are asked to imagine 3 pillars that hold them up when they are not able to hold themselves up and to share that with the group.  Participation Level:  Active  Participation Quality:  Appropriate  Affect:  Appropriate  Cognitive:  Oriented  Insight: Improving  Engagement in Group:  Engaged  Modes of Intervention:  Activity, Discussion, Education, and Support  Additional Comments:  Pt left the room.  Dione Housekeeper

## 2021-12-30 NOTE — Progress Notes (Signed)
Adult Psychoeducational Group Note  Date:  12/30/2021 Time:  9:04 PM  Group Topic/Focus:  Wrap-Up Group:   The focus of this group is to help patients review their daily goal of treatment and discuss progress on daily workbooks.  Participation Level:  None  Participation Quality:  Intrusive, Inattentive, and Resistant  Affect:  Defensive and Resistant  Cognitive:  Disorganized, Confused, and Delusional  Insight: Lacking  Engagement in Group:  Distracting, Off Topic, and Resistant  Modes of Intervention:  Discussion  Additional Comments:   Pt attended the Wrap Up group however was removed due to disruptive behavior. Pt was prompted 4x to calm, to refrain from over talking peers which he was not receptive. Pt continued to laugh and mumble when asked to follow the group rules. Pt denied any wrong doing, he stood up and was receptive when asked by the other MHT to leave the group.   Edmund Hilda Jori Thrall 12/30/2021, 9:04 PM

## 2021-12-30 NOTE — Progress Notes (Signed)
   12/30/21 0800  Psych Admission Type (Psych Patients Only)  Admission Status Involuntary  Psychosocial Assessment  Patient Complaints Anxiety  Eye Contact Fair  Facial Expression Animated  Affect Appropriate to circumstance  Speech Logical/coherent  Interaction Assertive  Motor Activity Fidgety;Restless  Appearance/Hygiene Improved  Behavior Characteristics Cooperative;Appropriate to situation  Mood Pleasant  Aggressive Behavior  Targets Other (Comment) (wnl)  Effect No apparent injury  Thought Process  Coherency Disorganized;Flight of ideas  Content Delusions  Delusions Grandeur  Perception Hallucinations  Hallucination Auditory;Visual  Judgment Limited  Confusion None  Danger to Self  Current suicidal ideation? Denies  Danger to Others  Danger to Others None reported or observed

## 2021-12-30 NOTE — Progress Notes (Signed)
   12/30/21 2155  Psych Admission Type (Psych Patients Only)  Admission Status Involuntary  Psychosocial Assessment  Patient Complaints None  Eye Contact Fair  Facial Expression Flat  Affect Appropriate to circumstance  Speech Logical/coherent;Pressured  Interaction Assertive  Motor Activity Fidgety;Restless  Appearance/Hygiene Unremarkable  Behavior Characteristics Anxious  Thought Process  Coherency Disorganized;Flight of ideas  Content Delusions  Delusions Grandeur  Perception Hallucinations  Hallucination Auditory;Visual  Judgment Limited  Confusion None  Danger to Self  Current suicidal ideation? Denies  Danger to Others  Danger to Others None reported or observed

## 2021-12-31 DIAGNOSIS — F250 Schizoaffective disorder, bipolar type: Secondary | ICD-10-CM | POA: Diagnosis not present

## 2021-12-31 LAB — CBC WITH DIFFERENTIAL/PLATELET
Abs Immature Granulocytes: 0.06 10*3/uL (ref 0.00–0.07)
Basophils Absolute: 0.1 10*3/uL (ref 0.0–0.1)
Basophils Relative: 1 %
Eosinophils Absolute: 0.3 10*3/uL (ref 0.0–0.5)
Eosinophils Relative: 2 %
HCT: 42.5 % (ref 39.0–52.0)
Hemoglobin: 14.1 g/dL (ref 13.0–17.0)
Immature Granulocytes: 1 %
Lymphocytes Relative: 22 %
Lymphs Abs: 2.8 10*3/uL (ref 0.7–4.0)
MCH: 29.5 pg (ref 26.0–34.0)
MCHC: 33.2 g/dL (ref 30.0–36.0)
MCV: 88.9 fL (ref 80.0–100.0)
Monocytes Absolute: 0.6 10*3/uL (ref 0.1–1.0)
Monocytes Relative: 5 %
Neutro Abs: 8.8 10*3/uL — ABNORMAL HIGH (ref 1.7–7.7)
Neutrophils Relative %: 69 %
Platelets: 355 10*3/uL (ref 150–400)
RBC: 4.78 MIL/uL (ref 4.22–5.81)
RDW: 12.4 % (ref 11.5–15.5)
WBC: 12.5 10*3/uL — ABNORMAL HIGH (ref 4.0–10.5)
nRBC: 0 % (ref 0.0–0.2)

## 2021-12-31 LAB — GLUCOSE, CAPILLARY: Glucose-Capillary: 110 mg/dL — ABNORMAL HIGH (ref 70–99)

## 2021-12-31 NOTE — Progress Notes (Signed)
   12/31/21 1100  Psych Admission Type (Psych Patients Only)  Admission Status Involuntary  Psychosocial Assessment  Patient Complaints None  Eye Contact Fair  Facial Expression Animated  Affect Appropriate to circumstance  Speech Logical/coherent  Interaction Assertive  Motor Activity Restless  Appearance/Hygiene Unremarkable  Behavior Characteristics Cooperative  Mood Anxious  Thought Process  Coherency Disorganized;Flight of ideas  Content Delusions  Delusions Grandeur  Perception Hallucinations  Hallucination Auditory;Visual  Judgment Limited  Confusion None  Danger to Self  Current suicidal ideation? Denies  Danger to Others  Danger to Others None reported or observed

## 2021-12-31 NOTE — Progress Notes (Signed)
Adult Psychoeducational Group Note  Date:  12/31/2021 Time:  9:04 PM  Group Topic/Focus:  Wrap-Up Group:   The focus of this group is to help patients review their daily goal of treatment and discuss progress on daily workbooks.  Participation Level:  Active  Participation Quality:  Appropriate  Affect:  Appropriate  Cognitive:  Appropriate  Insight: Appropriate  Engagement in Group:  Developing/Improving  Modes of Intervention:  Discussion  Additional Comments:  Pt stated his goal for today was to focus on his treatment plan. Pt stated he accomplished his goal today. Pt stated he talked with his doctor and social worker about his care today. Pt rated his overall day a 10.  Pt stated he was able to contact his parents today which improved his overall day. Pt stated his father coming for visitation tonight improved his evening. Pt stated he felt better about himself today. Pt stated he was able to attend all meals. Pt stated he took all medications provided today. Pt stated he attend all groups held today. Pt stated his appetite was pretty good today. Pt rated sleep last night was fair. Pt stated the goal tonight was to get some rest. Pt stated he had no physical pain tonight. Pt deny visual hallucinations and auditory issues tonight.  Pt denies thoughts of harming himself or others. Pt stated he would alert staff if anything changed  [name] 12/31/2021, 9:04 PM

## 2021-12-31 NOTE — Progress Notes (Signed)
   12/31/21 2100  Psych Admission Type (Psych Patients Only)  Admission Status Involuntary  Psychosocial Assessment  Patient Complaints Suspiciousness  Eye Contact Fair  Facial Expression Animated  Affect Appropriate to circumstance  Speech Logical/coherent  Interaction Assertive  Motor Activity Restless  Appearance/Hygiene Unremarkable  Behavior Characteristics Cooperative  Mood Suspicious;Anxious  Aggressive Behavior  Effect No apparent injury  Thought Chartered certified accountant of ideas;Disorganized  Content Delusions  Delusions Grandeur  Perception Hallucinations  Hallucination Visual;Auditory  Judgment Impaired  Confusion None  Danger to Self  Current suicidal ideation? Denies  Danger to Others  Danger to Others None reported or observed

## 2021-12-31 NOTE — Group Note (Signed)
Recreation Therapy Group Note   Group Topic:Self-Esteem  Group Date: 12/31/2021 Start Time: 1000 End Time: 1030 Facilitators: Caroll Rancher, LRT,CTRS Location: [address] Dayroom   Goal Area(s) Addresses:  Patient will identify and write at least one positive trait about themself. Patient will acknowledge the benefit of healthy self-esteem. Patient will endorse understanding of ways to increase self-esteem.   Group Description:   LRT began group session with open dialogue asking the patients to define self-esteem and verbally identify positive qualities and traits people may possess. Patients were then instructed to design a personalized license plate, with words and drawings, representing at least 3 positive things about themselves. Pts were encouraged to include favorites, things they are proud of, what they enjoy doing, and dreams for their future. If a patient had a life motto or a meaningful phase that expressed their life values, pt's were asked to incorporate that into their design as well. Patients were given the opportunity to share their completed work with the group.   Affect/Mood: Appropriate   Participation Level: Moderate   Participation Quality: Independent   Behavior: Appropriate   Speech/Thought Process: Relevant   Insight: Moderate   Judgement: Moderate   Modes of Intervention: Art   Patient Response to Interventions:  Engaged   Education Outcome:  Acknowledges education and In group clarification offered    Clinical Observations/Individualized Feedback: Pt stated "Chosen One" was his entertainment name.  Pt also stated he was good at rapping, singing, acting and dancing.  Pt also expressed being able to see to make things better.  Pt went on to say he can think of ideas but can't do them.  Patient also talked about a member of his ACTT team having him admitted to the hospital because he was dancing in [employer].    Plan: Continue to engage patient in RT  group sessions 2-3x/week.   Caroll Rancher, LRT,CTRS 12/31/2021 11:36 AM

## 2021-12-31 NOTE — Progress Notes (Signed)
[facility] MD Progress Note  12/31/2021 2:57 PM [name]  MRN:  [id number]  Reason for Admission:  [name] is a 46 year old male with a reported past psychiatric history of schizophrenia paranoid, bipolar disorder housing insecurity who presented to [facility] ED on 8/3 under IVC for psychosis and bizarre behaviors.  The patient is currently on Hospital Day 24.   Chart Review from last 24 hours:  The patient's chart was reviewed and nursing notes were reviewed. The patient's case was discussed in multidisciplinary team meeting. Per nursing he remained disorganized, seen talking to himself, but pleasant. He attended groups. He had not acute behavioral issues noted. Per [facility] he was compliant with scheduled medications except Colace. He received Advil X1, and Vistaril X1, Melatonin X1, Restoril X1 and Trazodone X1 for sleep.  Yesterday's Recommendations per Psychiatry Team: -Continue Haldol 5 mg every 12 hours - Patient currently on Abilify Maintena 400 mg LAI, last given 8/16 - Continue clozapine up titration for schizophrenia to 125mg  qhs tonight    -Continue Depakote 500 mg bid - for mood stabilization (VPA level 40 8/30) - not currently manic or agitated so will hold on further dose titration at this time given previous issues with elevated ammonia at higher dose - Continue metformin 1000 mg twice daily with meals - Continue Jardiance 10 mg every morning  Continue Protonix 40 mg po Q am  - Continue home atorvastatin 40 mg daily - Nicotine patch 21 mg daily -- D/c Cogentin with dose titration up on Clozaril which is more anticholinergic due to c/o sedation and monitor for EPS - Schedule Colace 100mg  daily with dose titration up on Clozaril  On Evaluation Today: The patient was seen and evaluated in his room. He states his mood is "good" and overall he remains pleasant.  He states he slept well overnight and has a normal level of energy this morning.  He reports good appetite.  He denies  somatic concerns or complaints today but again reports increased nocturnal urinary frequency last night, waking to urinate 3 times. Pt also reports "heavy legs". Pt feels he is "ready to go home". He denies all other symptoms that are potential adverse effects of Clozaril specifically constipation, drooling, enuresis, dizziness, sore throat, palpitations, CP or SOB. He is initially much more appropriate in conversation, and appeared to be closer to baseline.  He denies SI, HI, paranoia, thought insertion/withdrawal, and thought broadcasting.  He is oriented x 3.  As the assessment progresses, patient begins to disclose that he believes that he has a camera in his air conditioner in the room, as he has a similar unit in his apartment in which there is a camera that he covers.  Additionally, he reports that the air conditioner is "humming now but will get riled up when you leave."  He says that a few days ago, he saw his brother who told him he was "ready to be resurrected.  I just did not say anything to you all then."  He then goes to the window and looks outside, stating that he sometimes sees the shadow of [name]'s face in the grass when the sun is in the [place], "but that may just be a coincidence."  He also reports that he cried in the bathroom, and when the tears fell he was able to see Spider-Man in them.  While patient was sometimes able to rationalize, he did note that "I only say when I can see and hear things while I am  in the psychiatric hospital.  I do not say these things when I am in the real world or people will think that I am crazy."    Principal Problem: Schizoaffective disorder, bipolar type ([place]) Diagnosis: Principal Problem:   Schizoaffective disorder, bipolar type ([place]) Active Problems:   Tobacco use disorder   Diabetes ([place])   Hyperlipidemia   Total Time Spent in Direct Patient Care:  I personally spent 30 minutes on the unit in direct patient care. The direct patient  care time included face-to-face time with the patient, reviewing the patient's chart, communicating with other professionals, and coordinating care. Greater than 50% of this time was spent in counseling or coordinating care with the patient regarding goals of hospitalization, psycho-education, and discharge planning needs.  Past Psychiatric History: See H&P  Past Medical History:  Past Medical History:  Diagnosis Date   Bipolar disorder ([place])    Bronchitis    Hyperlipidemia    Paranoid schizophrenia ([place])    Schizophrenia ([place])     Past Surgical History:  Procedure Laterality Date   TESTICLE IMPLANTATION TO THIGH     TESTICLE SURGERY     Family History:  Family History  Problem Relation Age of Onset   Schizophrenia Neg Hx    Family Psychiatric  History: See H&P  Social History:  Social History   Substance and Sexual Activity  Alcohol Use Not Currently   Comment: weekly     Social History   Substance and Sexual Activity  Drug Use Yes   Types: Marijuana   Comment: smokes hemp    Social History   Socioeconomic History   Marital status: Single    Spouse name: Not on file   Number of children: Not on file   Years of education: 12 years   Highest education level: 12th grade  Occupational History   Occupation: On disability  Tobacco Use   Smoking status: Every Day    Packs/day: 1.00    Types: Cigarettes   Smokeless tobacco: Never  Vaping Use   Vaping Use: Never used  Substance and Sexual Activity   Alcohol use: Not Currently    Comment: weekly   Drug use: Yes    Types: Marijuana    Comment: smokes hemp   Sexual activity: Yes    Birth control/protection: Condom  Other Topics Concern   Not on file  Social History Narrative   Pt lives alone in apartment complex for mentally ill   Social Determinants of Health   Financial Resource Strain: Not on file  Food Insecurity: Not on file  Transportation Needs: Not on file  Physical Activity: Not on file   Stress: Not on file  Social Connections: Not on file   Sleep: Good  Appetite:  Good  Current Medications: Current Facility-Administered Medications  Medication Dose Route Frequency Provider Last Rate Last Admin   acetaminophen (TYLENOL) tablet 650 mg  650 mg Oral Q6H PRN Rosezetta Schlatter, MD   650 mg at 12/30/21 1856   alum & mag hydroxide-simeth (MAALOX/MYLANTA) 200-200-20 MG/5ML suspension 30 mL  30 mL Oral Q4H PRN Bobbitt, Shalon E, NP   30 mL at 12/20/21 1135   atorvastatin (LIPITOR) tablet 40 mg  40 mg Oral QHS Bobbitt, Shalon E, NP   40 mg at 12/30/21 2049   benztropine (COGENTIN) tablet 0.5 mg  0.5 mg Oral BID PRN Harlow Asa, MD       cloZAPine (CLOZARIL) tablet 150 mg  150 mg Oral QHS Rosezetta Schlatter, MD  divalproex (DEPAKOTE) DR tablet 500 mg  500 mg Oral Q12H Massengill, Nathan, MD   500 mg at 12/31/21 [id number]   empagliflozin (JARDIANCE) tablet 10 mg  10 mg Oral Daily Rosezetta Schlatter, MD   10 mg at 12/31/21 [id number]   haloperidol (HALDOL) tablet 5 mg  5 mg Oral Q12H Massengill, Ovid Curd, MD   5 mg at 12/31/21 [id number]   haloperidol (HALDOL) tablet 5 mg  5 mg Oral Q8H PRN Massengill, Ovid Curd, MD       And   LORazepam (ATIVAN) tablet 1 mg  1 mg Oral Q8H PRN Massengill, Ovid Curd, MD       haloperidol lactate (HALDOL) injection 5 mg  5 mg Intramuscular Q8H PRN Massengill, Ovid Curd, MD       And   LORazepam (ATIVAN) injection 1 mg  1 mg Intramuscular Q8H PRN Massengill, Ovid Curd, MD       hydrOXYzine (ATARAX) tablet 25 mg  25 mg Oral TID PRN Bobbitt, Shalon E, NP   25 mg at 12/30/21 2049   ibuprofen (ADVIL) tablet 800 mg  800 mg Oral BID PRN Harlow Asa, MD   800 mg at 12/31/21 0851   magnesium hydroxide (MILK OF MAGNESIA) suspension 30 mL  30 mL Oral Daily PRN Bobbitt, Shalon E, NP   30 mL at 12/10/21 2029   melatonin tablet 5 mg  5 mg Oral QHS PRN Massengill, Ovid Curd, MD   5 mg at 12/30/21 2049   metFORMIN (GLUCOPHAGE) tablet 1,000 mg  1,000 mg Oral BID WC Bobbitt, Shalon E, NP   1,000  mg at 12/31/21 [id number]   Muscle Rub CREA   Topical PRN Rosezetta Schlatter, MD   1 Application at [id number] 1125   nicotine (NICODERM CQ - dosed in mg/24 hours) patch 21 mg  21 mg Transdermal Daily Rosezetta Schlatter, MD   21 mg at 12/31/21 [id number]   pantoprazole (PROTONIX) EC tablet 40 mg  40 mg Oral Daily Nwoko, Herbert Pun I, NP   40 mg at 12/31/21 [id number]   polyethylene glycol (MIRALAX / GLYCOLAX) packet 17 g  17 g Oral Daily PRN Harlow Asa, MD       senna-docusate (Senokot-S) tablet 1 tablet  1 tablet Oral Daily Rosezetta Schlatter, MD   1 tablet at 12/31/21 [id number]   sodium chloride (OCEAN) 0.65 % nasal spray 1 spray  1 spray Each Nare PRN Rosezetta Schlatter, MD   1 spray at 12/25/21 0834   temazepam (RESTORIL) capsule 30 mg  30 mg Oral QHS PRN Harlow Asa, MD   30 mg at 12/30/21 2048   traZODone (DESYREL) tablet 100 mg  100 mg Oral QHS PRN Harlow Asa, MD   100 mg at 12/30/21 2049    Lab Results:  Results for orders placed or performed during the hospital encounter of 12/07/21 (from the past 48 hour(s))  Glucose, capillary     Status: Abnormal   Collection Time: 12/30/21  6:09 AM  Result Value Ref Range   Glucose-Capillary 108 (H) 70 - 99 mg/dL    Comment: Glucose reference range applies only to samples taken after fasting for at least 8 hours.  Glucose, capillary     Status: Abnormal   Collection Time: 12/31/21  6:16 AM  Result Value Ref Range   Glucose-Capillary 110 (H) 70 - 99 mg/dL    Comment: Glucose reference range applies only to samples taken after fasting for at least 8 hours.    Blood Alcohol level:  Lab Results  Component  Value Date   ETH <10 11/28/2021   ETH <10 11/23/2021    Metabolic Disorder Labs: Lab Results  Component Value Date   HGBA1C 6.9 (H) 11/17/2021   MPG 151.33 11/17/2021   MPG 111.15 02/03/2020   Lab Results  Component Value Date   PROLACTIN 8.1 06/01/2019   Lab Results  Component Value Date   CHOL 130 12/01/2021   TRIG 172 (H) 12/01/2021   HDL 28 (L)  12/01/2021   CHOLHDL 4.6 12/01/2021   VLDL 34 12/01/2021   LDLCALC 68 12/01/2021   LDLCALC 97 02/03/2020    Physical Findings:  Musculoskeletal: Strength & Muscle Tone: Normal Gait & Station: normal Patient leans: N/A  Psychiatric Specialty Exam:  Presentation  General Appearance: Casually dressed, adequate hygiene   Eye Contact: Good   Speech:clear, coherent, initially less rambling and less verbose until he is asked about AVH   Speech Volume:Normal  Mood and Affect  Mood:described as good - appears euthymic   Affect:polite, calm    Thought Process  Thought Processes:Tangential - less disorganized with derailment only at the end of the assessment  Orientation:Oriented to month, date, self, year and city and place   Thought Content: is not responding to internal stimuli during exam; thought insertion/withdrawal/broadcasting, ideas of reference or paranoia  History of Schizophrenia/Schizoaffective disorder:Yes   Duration of Psychotic Symptoms:Greater than six months   Hallucinations: See HPI   Ideas of Reference:None   Suicidal Thoughts:Denied   Homicidal Thoughts:Denied    Sensorium  Memory: Fair  Judgment:Fair - complying with meds   Insight:Shallow    Executive Functions  Concentration: Fair   Attention Span: Fair   Recall:Fair   Fund of Knowledge:Fair   Language:Good    Psychomotor Activity  Psychomotor Activity:No restlessness, tremor, or akathisias noted ; no cogwheeling, stiffness and AIMS 0    Assets  Assets:Desire for Improvement; Resilience; Social Support  Sleep: Good  Physical Exam Vitals and nursing note reviewed.  HENT:     Head: Normocephalic and atraumatic.  Musculoskeletal:     Comments: No LE edema or pain to BLE with palpation  Skin:    General: Skin is warm and dry.  Neurological:     General: No focal deficit present.     Mental Status: He is alert.    Review of Systems  Respiratory:   Negative for shortness of breath.   Cardiovascular:  Negative for chest pain and palpitations.  Gastrointestinal: Negative.  Negative for abdominal pain, constipation, diarrhea, nausea and vomiting.  Genitourinary: Negative.  Negative for dysuria, frequency and urgency.  Musculoskeletal: Negative.   Neurological:  Negative for dizziness and headaches.   Blood pressure 122/80, pulse 98, temperature 97.8 F (36.6 C), temperature source Oral, resp. rate 16, height 6\' 1"  (1.854 m), weight 117.9 kg, SpO2 99 %. Body mass index is 34.3 kg/m.   Treatment Plan Summary: ASSESSMENT: Principal Problem:   Schizoaffective disorder, bipolar type (H[facility]) Active Problems: Tobacco use disorder Type 2 DM Hyperlipidemia  PLAN: Safety and Monitoring:             -- Involuntary admission to inpatient psychiatric unit for safety, stabilization and treatment             -- Daily contact with patient to assess and evaluate symptoms and progress in treatment             -- Patient's case to be discussed in multi-disciplinary team meeting             --  Observation Level : q15 minute checks             -- Vital signs:  q12 hours             -- Precautions: suicide, elopement, and assault   2. Psychiatric Diagnoses and Treatment:  # Schizoaffective disorder - bipolar type -- Discontinued Zyprexa  -- Continue Haldol 5 mg every 12 hours; will consider decreasing a.m. dose if patient continues to have increased daytime somnolence with dose titration up on Clozaril -- Patient currently on Abilify Maintena 400 mg LAI, last given 8/16 -- Continue clozapine up titration for schizophrenia, increasing to 137.5 mg tonight and to 150mg  qhs on 9/4 due to continued psychosis   Weekly labs/EKG: CRP 0.7, Troponin I 3, WBC 13.4 and ANC 9200 on 8/29; QTC 9/29 on 8/29 -due for repeat on 9/5 --Continue Depakote 500 mg bid - for mood stabilization  (VPA level 40 8/30) - not currently manic or agitated so will hold on further  dose titration at this time given previous issues with elevated ammonia at higher dose -- Metabolic profile and EKG monitoring obtained while on antipsychotics  BMI: 34.3 Lipid Panel: WNL other than TG 172 HbgA1c: 6.9% QTc: 413 and on repeat 9/30 -- Encouraged patient to participate in unit milieu and in scheduled group therapies  -- Checked total CK (173) given vague c/o related to leg heaviness- currently afebrile and VSS; continue to monitor with Clozaril titration  -- D/c Cogentin with dose titration up on Clozaril which is more anticholinergic due to c/o sedation and monitor for EPS -- Scheduled Senna 8.6 daily with dose titration up on Clozaril and monitoring bowel function     3. Medical Issues Being Addressed: #Nicotine use disorder - Nicotine patch 21 mg daily   #T2DM -- Due to the fact that patient is actively psychotic, only completing a.m. FSBS; will resume.  Patient on home regimen and diabetes well-controlled (A1c 6.9%), so minimal concern for decompensation. - Continue Metformin 1000 mg twice daily with meals - Continue Jardiance 10 mg every morning - A.m. CBG monitoring only   #Hyperlipidemia - Continue home atorvastatin 40 mg daily  #Tobacco use d/o - Nicotine patch 21mg /24 hours  #Acid reflux.  - Continue Protonix 40 mg po Q am   4. Discharge Planning:               -- Social work and case management to assist with discharge planning and identification of hospital follow-up needs prior to discharge             -- Discharge Concerns: Need to establish a safety plan; Medication compliance and effectiveness             -- Discharge Goals: Return home with outpatient referrals for mental health follow-up including medication management/psychotherapy   , MD 12/31/2021, 2:57 PM

## 2022-01-01 DIAGNOSIS — E119 Type 2 diabetes mellitus without complications: Secondary | ICD-10-CM | POA: Diagnosis not present

## 2022-01-01 DIAGNOSIS — F250 Schizoaffective disorder, bipolar type: Secondary | ICD-10-CM | POA: Diagnosis not present

## 2022-01-01 DIAGNOSIS — F17200 Nicotine dependence, unspecified, uncomplicated: Secondary | ICD-10-CM | POA: Diagnosis not present

## 2022-01-01 LAB — TROPONIN I (HIGH SENSITIVITY): Troponin I (High Sensitivity): 3 ng/L (ref <18)

## 2022-01-01 LAB — CBC WITH DIFFERENTIAL/PLATELET
Abs Immature Granulocytes: 0.05 10*3/uL (ref 0.00–0.07)
Basophils Absolute: 0 10*3/uL (ref 0.0–0.1)
Basophils Relative: 0 %
Eosinophils Absolute: 0.2 10*3/uL (ref 0.0–0.5)
Eosinophils Relative: 2 %
HCT: 42.5 % (ref 39.0–52.0)
Hemoglobin: 14.6 g/dL (ref 13.0–17.0)
Immature Granulocytes: 1 %
Lymphocytes Relative: 22 %
Lymphs Abs: 2.3 10*3/uL (ref 0.7–4.0)
MCH: 30.3 pg (ref 26.0–34.0)
MCHC: 34.4 g/dL (ref 30.0–36.0)
MCV: 88.2 fL (ref 80.0–100.0)
Monocytes Absolute: 0.5 10*3/uL (ref 0.1–1.0)
Monocytes Relative: 5 %
Neutro Abs: 7.4 10*3/uL (ref 1.7–7.7)
Neutrophils Relative %: 70 %
Platelets: 354 10*3/uL (ref 150–400)
RBC: 4.82 MIL/uL (ref 4.22–5.81)
RDW: 12.5 % (ref 11.5–15.5)
WBC: 10.6 10*3/uL — ABNORMAL HIGH (ref 4.0–10.5)
nRBC: 0 % (ref 0.0–0.2)

## 2022-01-01 LAB — C-REACTIVE PROTEIN: CRP: 0.7 mg/dL (ref <1.0)

## 2022-01-01 LAB — GLUCOSE, CAPILLARY: Glucose-Capillary: 100 mg/dL — ABNORMAL HIGH (ref 70–99)

## 2022-01-01 MED ORDER — WHITE PETROLATUM EX OINT
TOPICAL_OINTMENT | CUTANEOUS | Status: AC
  Filled 2022-01-01: qty 5

## 2022-01-01 MED ORDER — HALOPERIDOL 5 MG PO TABS
7.5000 mg | ORAL_TABLET | Freq: Two times a day (BID) | ORAL | Status: DC
  Administered 2022-01-01 – 2022-01-03 (×4): 7.5 mg via ORAL
  Filled 2022-01-01 (×9): qty 1

## 2022-01-01 NOTE — Progress Notes (Signed)
   01/01/22 2045  Psych Admission Type (Psych Patients Only)  Admission Status Involuntary  Psychosocial Assessment  Patient Complaints None  Eye Contact Fair  Facial Expression Animated  Affect Appropriate to circumstance  Speech Logical/coherent  Interaction Assertive  Motor Activity Restless  Appearance/Hygiene Unremarkable  Behavior Characteristics Cooperative  Mood Preoccupied  Aggressive Behavior  Effect No apparent injury  Thought Chartered certified accountant of ideas;Disorganized  Content Delusions  Delusions Grandeur  Perception Hallucinations  Hallucination Visual;Auditory  Judgment Impaired  Confusion None  Danger to Self  Current suicidal ideation? Denies  Danger to Others  Danger to Others None reported or observed

## 2022-01-01 NOTE — Progress Notes (Signed)
Adult Psychoeducational Group Note  Date:  01/01/2022 Time:  10:49 PM  Group Topic/Focus:  Wrap-Up Group:   The focus of this group is to help patients review their daily goal of treatment and discuss progress on daily workbooks.  Participation Level:  Active  Participation Quality:  Appropriate  Affect:  Appropriate  Cognitive:  Appropriate  Insight: Appropriate  Engagement in Group:  Improving  Modes of Intervention:  Discussion  Additional Comments:  Pt stated his goal for today was to focus on his treatment plan. Pt stated he accomplished his goal today. Pt stated he talked with his doctor and social worker about his care today. Pt rated his overall day a 10.  Pt stated he was able to contact his parents today which improved his overall day. Pt stated his father coming for visitation tonight improved his evening. Pt stated he felt better about himself today. Pt stated he was able to attend all meals. Pt stated he took all medications provided today. Pt stated he attend all groups held today. Pt stated his appetite was pretty good today. Pt rated sleep last night was pretty good. Pt stated the goal tonight was to get some rest. Pt stated he had no physical pain tonight. Pt deny visual hallucinations and auditory issues tonight.  Pt denies thoughts of harming himself or others. Pt stated he would alert staff if anything changed    [name] 01/01/2022, 10:49 PM

## 2022-01-01 NOTE — Progress Notes (Signed)
[facility] MD Progress Note   01/01/22 11:50 [name]  [id number]    Subjective:     [name] is a 46 year old male with a reported past psychiatric history of schizophrenia paranoid, bipolar disorder housing insecurity who presented to [facility] ED on 8/3 under IVC for psychosis and bizarre behaviors.    Yesterday the psychiatry team made the following recommendations: -Continue Haldol 5 mg every 12 hours - Patient currently on Abilify Maintena 400 mg LAI, last given 8/16 - Continue clozapine up titration for schizophrenia to 150 mg qhs tonight    -Continue Depakote 500 mg bid - for mood stabilization    On assessment today, patient was seen and evaluated in his room. He states he feels "good" and he is overall pleasant in conversation. He denies SI, HI, VH. He mentions hearing things "out loud" and "other people hear them and don't want to talk about it because they are scared". He gave specifics that he was hearing his late brother, who is angry at him for not attending his funeral. He states he is eating well, including a sausage biscuit this AM. He has a delusion that he is waking up in the middle of the night wetting his bed (nursing confirms that he has not told them this). The room does not currently smell of urine. Besides this delusion, he denies problems sleeping, stating he got 7-8h last night.  He remains very delusional in his thinking. He mentions meeting [address] "the real one" and Renelda Loma "the one that wrote the bible". He mentions aliens several times, including abductions and other occurrences. These delusions do not bother him, and he states "they always revive you". He is very tangential, rarely answering questions directly. However, he is easy to redirect and will answer questions with a little reminding.  Physically he has diaphoresis, but otherwise look well physically. Beyond his delusion of nocturnal enuresis, he has no other somatic complaints. When asked what his  plans are after discharge, he goes on a tangent about things that happened in his home (such as breaking the door or people stealing his mail) and does not directly answer.   Daily review of symptoms, specific for clozapine: Malaise/Sedation: Denies Chest pain: Denies Shortness of breath: Denies Exertional capacity: Denies Tachycardia: Denies Cough: Denies Sore Throat: Denies Fever: Denies Orthostatic hypotension (dizziness with standing): Denies Hypersalivation: Denies Constipation: Reports last bowel movement was yesterday Symptoms of GERD: Denies Nausea: Denies Nocturnal enuresis: none per nursing    Principal Problem: Schizoaffective disorder, bipolar type (HCC) Diagnosis: Principal Problem:   Schizoaffective disorder, bipolar type (HCC) Active Problems:   Tobacco use disorder   Diabetes (HCC)   Hyperlipidemia  Total Time spent with patient: 25 minutes   Past Psychiatric History:  Previous Psych Diagnoses: Schizophrenia, Schizoaffective disorder-bipolar type Prior inpatient treatment: Multiple, [facility] 04/2018 Prior outpatient treatment: [facility] History of suicide: History of homicide: Denies Psychiatric medication history: Previously on 6/14 Zyprexa 5 mg qAM and 30 mg qHS; Prolixin 10 mg in 05/2020, Invega Sustenna, PO Invega, and Haldol (02/2020), Neurontin.  Currently taking Abilify Maintena 400 mg and Zyprexa 10 mg BID Psychiatric medication compliance history: Reports compliance, and picking up medications appropriately, per pharmacy records Neuromodulation history: Denies Current Psychiatrist:[facility] services via [facility]  Past Medical History:  Diagnosis Date   Bipolar disorder (HCC)    Bronchitis    Hyperlipidemia    Paranoid schizophrenia (HCC)    Schizophrenia (HCC)     Family Medical History:  Family History  Problem Relation Age of Onset   Schizophrenia Neg Hx      Family Psychiatric history: Schizophrenia- Maternal grandfather.   Social History    Socioeconomic History   Marital status: Single    Spouse name: Not on file   Number of children: Not on file   Years of education: 12 years   Highest education level: 12th grade  Occupational History   Occupation: On disability  Tobacco Use   Smoking status: Every Day    Packs/day: 1.00    Types: Cigarettes   Smokeless tobacco: Never  Vaping Use   Vaping Use: Never used  Substance and Sexual Activity   Alcohol use: Not Currently    Comment: weekly   Drug use: Yes    Types: Marijuana    Comment: smokes hemp   Sexual activity: Yes    Birth control/protection: Condom  Other Topics Concern   Not on file  Social History Narrative   Pt lives alone in apartment complex for mentally ill   Social Determinants of Health   Financial Resource Strain: Not on file  Food Insecurity: Not on file  Transportation Needs: Not on file  Physical Activity: Not on file  Stress: Not on file  Social Connections: Not on file  Intimate Partner Violence: Not on file    Sleep: Fair 7-8h. But with nocturnal enuresis delusion.  Appetite: Fair  Current Medications:   Current Facility-Administered Medications:    acetaminophen (TYLENOL) tablet 650 mg, 650 mg, Oral, Q6H PRN, Rosezetta Schlatter, MD, 650 mg at 12/30/21 1856   alum & mag hydroxide-simeth (MAALOX/MYLANTA) 200-200-20 MG/5ML suspension 30 mL, 30 mL, Oral, Q4H PRN, Bobbitt, Shalon E, NP, 30 mL at 12/20/21 1135   atorvastatin (LIPITOR) tablet 40 mg, 40 mg, Oral, QHS, Bobbitt, Shalon E, NP, 40 mg at 12/31/21 2047   benztropine (COGENTIN) tablet 0.5 mg, 0.5 mg, Oral, BID PRN, Nelda Marseille, Amy E, MD   cloZAPine (CLOZARIL) tablet 150 mg, 150 mg, Oral, QHS, Cosby, Loma Sousa, MD, 150 mg at 12/31/21 2046   divalproex (DEPAKOTE) DR tablet 500 mg, 500 mg, Oral, Q12H, Ina Poupard, Ovid Curd, MD, 500 mg at 01/01/22 0813   empagliflozin (JARDIANCE) tablet 10 mg, 10 mg, Oral, Daily, Rosezetta Schlatter, MD, 10 mg at 01/01/22 0813   haloperidol (HALDOL) tablet 5  mg, 5 mg, Oral, Q12H, Albeiro Trompeter, Ovid Curd, MD, 5 mg at 01/01/22 [id number]   haloperidol (HALDOL) tablet 5 mg, 5 mg, Oral, Q8H PRN **AND** [DISCONTINUED] benztropine (COGENTIN) tablet 1 mg, 1 mg, Oral, Q8H PRN **AND** LORazepam (ATIVAN) tablet 1 mg, 1 mg, Oral, Q8H PRN, Govind Furey, MD   haloperidol lactate (HALDOL) injection 5 mg, 5 mg, Intramuscular, Q8H PRN **AND** [DISCONTINUED] benztropine mesylate (COGENTIN) injection 1 mg, 1 mg, Intramuscular, Q8H PRN **AND** LORazepam (ATIVAN) injection 1 mg, 1 mg, Intramuscular, Q8H PRN, Luretha Eberly, MD   hydrOXYzine (ATARAX) tablet 25 mg, 25 mg, Oral, TID PRN, Bobbitt, Shalon E, NP, 25 mg at 12/31/21 2047   ibuprofen (ADVIL) tablet 800 mg, 800 mg, Oral, BID PRN, Nelda Marseille, Amy E, MD, 800 mg at 12/31/21 0851   magnesium hydroxide (MILK OF MAGNESIA) suspension 30 mL, 30 mL, Oral, Daily PRN, Bobbitt, Shalon E, NP, 30 mL at 12/10/21 2029   melatonin tablet 5 mg, 5 mg, Oral, QHS PRN, Edson Deridder, Ovid Curd, MD, 5 mg at 12/31/21 2047   metFORMIN (GLUCOPHAGE) tablet 1,000 mg, 1,000 mg, Oral, BID WC, Bobbitt, Shalon E, NP, 1,000 mg at 01/01/22 0814   Muscle Rub CREA, , Topical,  PRN, Rosezetta Schlatter, MD, 1 Application at [id number] 1125   nicotine (NICODERM CQ - dosed in mg/24 hours) patch 21 mg, 21 mg, Transdermal, Daily, Cosby, Courtney, MD, 21 mg at 01/01/22 0813   pantoprazole (PROTONIX) EC tablet 40 mg, 40 mg, Oral, Daily, Nwoko, Agnes I, NP, 40 mg at 01/01/22 0814   polyethylene glycol (MIRALAX / GLYCOLAX) packet 17 g, 17 g, Oral, Daily PRN, Nelda Marseille, Amy E, MD   senna-docusate (Senokot-S) tablet 1 tablet, 1 tablet, Oral, Daily, Cosby, Loma Sousa, MD, 1 tablet at 01/01/22 0813   sodium chloride (OCEAN) 0.65 % nasal spray 1 spray, 1 spray, Each Nare, PRN, Rosezetta Schlatter, MD, 1 spray at 12/25/21 0834   temazepam (RESTORIL) capsule 30 mg, 30 mg, Oral, QHS PRN, Nelda Marseille, Amy E, MD, 30 mg at 12/31/21 2046   traZODone (DESYREL) tablet 100 mg, 100 mg, Oral, QHS PRN,  Nelda Marseille, Amy E, MD, 100 mg at 12/31/21 2047    Lab results: Results for orders placed or performed during the hospital encounter of 12/07/21 (from the past 48 hour(s))  Glucose, capillary     Status: Abnormal   Collection Time: 12/31/21  6:16 AM  Result Value Ref Range   Glucose-Capillary 110 (H) 70 - 99 mg/dL    Comment: Glucose reference range applies only to samples taken after fasting for at least 8 hours.  CBC with Differential/Platelet     Status: Abnormal   Collection Time: 12/31/21  6:20 PM  Result Value Ref Range   WBC 12.5 (H) 4.0 - 10.5 K/uL   RBC 4.78 4.22 - 5.81 MIL/uL   Hemoglobin 14.1 13.0 - 17.0 g/dL   HCT 42.5 39.0 - 52.0 %   MCV 88.9 80.0 - 100.0 fL   MCH 29.5 26.0 - 34.0 pg   MCHC 33.2 30.0 - 36.0 g/dL   RDW 12.4 11.5 - 15.5 %   Platelets 355 150 - 400 K/uL   nRBC 0.0 0.0 - 0.2 %   Neutrophils Relative % 69 %   Neutro Abs 8.8 (H) 1.7 - 7.7 K/uL   Lymphocytes Relative 22 %   Lymphs Abs 2.8 0.7 - 4.0 K/uL   Monocytes Relative 5 %   Monocytes Absolute 0.6 0.1 - 1.0 K/uL   Eosinophils Relative 2 %   Eosinophils Absolute 0.3 0.0 - 0.5 K/uL   Basophils Relative 1 %   Basophils Absolute 0.1 0.0 - 0.1 K/uL   Immature Granulocytes 1 %   Abs Immature Granulocytes 0.06 0.00 - 0.07 K/uL    Comment: Performed at [facility], [place] [address], [place], [place] [address]  Glucose, capillary     Status: Abnormal   Collection Time: 01/01/22  6:09 AM  Result Value Ref Range   Glucose-Capillary 100 (H) 70 - 99 mg/dL    Comment: Glucose reference range applies only to samples taken after fasting for at least 8 hours.  CBC with Differential/Platelet     Status: Abnormal   Collection Time: 01/01/22  6:24 AM  Result Value Ref Range   WBC 10.6 (H) 4.0 - 10.5 K/uL   RBC 4.82 4.22 - 5.81 MIL/uL   Hemoglobin 14.6 13.0 - 17.0 g/dL   HCT 42.5 39.0 - 52.0 %   MCV 88.2 80.0 - 100.0 fL   MCH 30.3 26.0 - 34.0 pg   MCHC 34.4 30.0 - 36.0 g/dL   RDW 12.5 11.5 -  15.5 %   Platelets 354 150 - 400 K/uL   nRBC 0.0 0.0 - 0.2 %  Neutrophils Relative % 70 %   Neutro Abs 7.4 1.7 - 7.7 K/uL   Lymphocytes Relative 22 %   Lymphs Abs 2.3 0.7 - 4.0 K/uL   Monocytes Relative 5 %   Monocytes Absolute 0.5 0.1 - 1.0 K/uL   Eosinophils Relative 2 %   Eosinophils Absolute 0.2 0.0 - 0.5 K/uL   Basophils Relative 0 %   Basophils Absolute 0.0 0.0 - 0.1 K/uL   Immature Granulocytes 1 %   Abs Immature Granulocytes 0.05 0.00 - 0.07 K/uL    Comment: Performed at [facility], [place] [address], [place], [address] [address]  C-reactive protein     Status: None   Collection Time: 01/01/22  6:24 AM  Result Value Ref Range   CRP 0.7 <1.0 mg/dL    Comment: Performed at [facility], [place] [address], [place], [place] [address]  Troponin I (High Sensitivity)     Status: None   Collection Time: 01/01/22  6:24 AM  Result Value Ref Range   Troponin I (High Sensitivity) 3 <18 ng/L    Comment: (NOTE) Elevated high sensitivity troponin I (hsTnI) values and significant  changes across serial measurements may suggest ACS but many other  chronic and acute conditions are known to elevate hsTnI results.  Refer to the "Links" section for chest pain algorithms and additional  guidance. Performed at [facility], [place] [address] [place], [place] [address]    . Physical Exam: Psychiatric Specialty Exam: Physical Exam Neurological:     Mental Status: He is alert.  Psychiatric:        Attention and Perception: Attention and perception normal.        Mood and Affect: Mood and affect normal.        Speech: Speech normal.        Behavior: Behavior normal. Behavior is cooperative.        Thought Content: Thought content is delusional.     Review of Systems  Cardiovascular:  Negative for chest pain.  Gastrointestinal:  Negative for constipation and diarrhea.  Neurological:  Negative for dizziness, facial asymmetry, light-headedness and  headaches.  Psychiatric/Behavioral:  Positive for decreased concentration and hallucinations. Negative for agitation and suicidal ideas.     Blood pressure 111/85, pulse (!) 102, temperature (!) 97.5 F (36.4 C), temperature source Oral, resp. rate 16, height 6\' 1"  (1.854 m), weight 117.9 kg, SpO2 100 %.Body mass index is 34.3 kg/m.  General Appearance: Fairly Groomed  Eye Contact:  Fair  Speech:  Normal Rate  Volume:  Normal  Mood:  Euthymic "good"  Affect:  Congruent  Thought Process:  Disorganized and Descriptions of Associations: Tangential  Orientation:  Full (Time, Place, and Person)  Thought Content:  Delusions and Hallucinations: Auditory  Suicidal Thoughts:  No  Homicidal Thoughts:  No  Memory:  Full memory intact.   Judgement:  Poor  Insight:  Lacking  Psychomotor Activity:  Normal  Concentration:  Concentration: Fair  Recall:  [employer] of Knowledge:  Fair  Language:  Fair  Akathisia:  No  Handed:  Right  AIMS (if indicated):  0   Assets:   Resilience  ADL's:  Intact  Cognition:  Intact  Sleep:  Number of Hours: 7.5    ASSESSMENT: Principal Problem:   Schizoaffective disorder, bipolar type ([place]) Active Problems: Tobacco use disorder Type 2 DM Hyperlipidemia   PLAN: Safety and Monitoring:             --  Involuntary admission to inpatient psychiatric unit for safety, stabilization and treatment             -- Daily contact with patient to assess and evaluate symptoms and progress in treatment             -- Patient's case to be discussed in multi-disciplinary team meeting             -- Observation Level : q15 minute checks             -- Vital signs:  q12 hours             -- Precautions: suicide, elopement, and assault   2. Psychiatric Diagnoses and Treatment:  # Schizoaffective disorder - bipolar type -- Previously discontinued Zyprexa (switched to haldol) -- INCREASE Haldol to 7.5 mg every 12 hours for psychosis  -- Patient currently on Abilify  Maintena 400 mg LAI, last given 8/16 -- Continue clozapine up titration for schizophrenia, remaining at 150 mg tonight and  reassess up titration tomorrow.  --Continue Depakote 500 mg bid - for mood stabilization  (VPA level 40 8/30) - not currently manic or agitated so will hold on further dose titration at this time given previous issues with elevated ammonia at higher dose -- Metabolic profile and EKG monitoring obtained while on antipsychotics  BMI: 34.3 Lipid Panel: WNL other than TG 172 HbgA1c: 6.9% QTc: 413 and on repeat -- Encouraged patient to participate in unit milieu and in scheduled group therapies  -- Checked total CK (173) given vague c/o related to leg heaviness- currently afebrile and VSS; continue to monitor with Clozaril titration  -- D/c Cogentin with dose titration up on Clozaril which is more anticholinergic due to c/o sedation and monitor for EPS -- Scheduled Senna 8.6 daily with dose titration up on Clozaril and monitoring bowel function     3. Medical Issues Being Addressed: #Tobacco use disorder - Continue Nicotine patch 21 mg daily   #T2DM -- Due to the fact that patient is actively psychotic, only completing a.m. FSBS; will resume.  Patient on home regimen and diabetes well-controlled (A1c 6.9%), so minimal concern for decompensation. - Continue Metformin 1000 mg twice daily with meals - Continue Jardiance 10 mg every morning - A.m. CBG monitoring only   #Hyperlipidemia - Continue home atorvastatin 40 mg daily     #Acid reflux.  - Continue Protonix 40 mg po Q am    4. Discharge Planning:  -- Social work and case management to assist with discharge planning and identification of hospital follow-up needs prior to discharge -- Discharge Concerns: Need to establish a safety plan; Medication compliance and effectiveness -- Discharge Goals: Return home with outpatient referrals for mental health follow-up including medication  management/psychotherapy  [place] OMS-IV   Total Time Spent in Direct Patient Care:  I personally spent 25 minutes on the unit in direct patient care. The direct patient care time included face-to-face time with the patient, reviewing the patient's chart, communicating with other professionals, and coordinating care. Greater than 50% of this time was spent in counseling or coordinating care with the patient regarding goals of hospitalization, psycho-education, and discharge planning needs.  I personally was present and performed or re-performed the history, physical exam and medical decision-making activities of this service and have verified that the service and findings are accurately documented in the student's note, , as addended by me or notated below:  I directly edited the note, as above. Pt is  still psychotic, less florid than my last evaluation 7 days ago. Can remain on topic and is more redirectable. I agree with increasing haldol.   Janine Limbo, MD Psychiatrist

## 2022-01-01 NOTE — Progress Notes (Signed)
   01/01/22 1100  Psych Admission Type (Psych Patients Only)  Admission Status Involuntary  Psychosocial Assessment  Patient Complaints None  Eye Contact Fair  Facial Expression Animated  Affect Appropriate to circumstance  Speech Soft  Interaction Assertive  Motor Activity Pacing  Appearance/Hygiene Unremarkable  Behavior Characteristics Cooperative  Mood Preoccupied  Thought Process  Coherency Flight of ideas;Disorganized  Content Delusions  Delusions Grandeur  Perception Hallucinations  Hallucination Auditory;Visual  Judgment Impaired  Confusion None  Danger to Self  Current suicidal ideation? Denies  Danger to Others  Danger to Others None reported or observed

## 2022-01-01 NOTE — [facility] Group Notes (Signed)
Adult Psychoeducational Group Note  Date:  01/01/2022 Time:  9:49 AM  Group Topic/Focus:  Goals Group:   The focus of this group is to help patients establish daily goals to achieve during treatment and discuss how the patient can incorporate goal setting into their daily lives to aide in recovery.  Participation Level:  Minimal  Participation Quality:  Attentive  Affect:  Flat  Cognitive:  Disorganized  Insight: Lacking  Engagement in Group:  Improving  Modes of Intervention:  Education  Additional Comments:  Pt attend Goal group. But was able to set a goal.  Einar Nolasco, Sharen Counter 01/01/2022, 9:49 AM

## 2022-01-01 NOTE — Progress Notes (Signed)
   01/01/22 0545  Sleep  Number of Hours 7.5

## 2022-01-02 ENCOUNTER — Encounter (HOSPITAL_COMMUNITY): Payer: Self-pay

## 2022-01-02 DIAGNOSIS — F17200 Nicotine dependence, unspecified, uncomplicated: Secondary | ICD-10-CM | POA: Diagnosis not present

## 2022-01-02 DIAGNOSIS — F250 Schizoaffective disorder, bipolar type: Secondary | ICD-10-CM | POA: Diagnosis not present

## 2022-01-02 DIAGNOSIS — E119 Type 2 diabetes mellitus without complications: Secondary | ICD-10-CM | POA: Diagnosis not present

## 2022-01-02 LAB — GLUCOSE, CAPILLARY: Glucose-Capillary: 115 mg/dL — ABNORMAL HIGH (ref 70–99)

## 2022-01-02 NOTE — Progress Notes (Signed)
   01/02/22 1000  Psych Admission Type (Psych Patients Only)  Admission Status Involuntary  Psychosocial Assessment  Patient Complaints None  Eye Contact Fair  Facial Expression Animated  Affect Appropriate to circumstance  Speech Logical/coherent  Interaction Assertive  Motor Activity Pacing  Appearance/Hygiene Unremarkable  Behavior Characteristics Cooperative  Mood Preoccupied  Aggressive Behavior  Effect No apparent injury  Thought Process  Coherency Disorganized;Flight of ideas  Content Delusions  Delusions Grandeur  Perception Hallucinations  Hallucination Auditory;Visual  Judgment Impaired  Confusion None  Danger to Self  Current suicidal ideation? Denies  Danger to Others  Danger to Others None reported or observed

## 2022-01-02 NOTE — Group Note (Signed)
   Date/Time: 01/02/2022 @ 1pm  Type of Therapy and Topic:  Group Therapy:  triggers  Participation Level:  Minimal  Description of Group:   Recognizing Triggers: Patients defined triggers and discussed the importance of recognizing their personal warning signs. Patients identified their own triggers and how they tend to cope with stressful situations. Patients discussed areas such as people, places, things, and thoughts that rigger certain emotions for them. CSW provided support to patients and discussed safety planning for when these triggers occur. Group participants had opportunities to share openly with the group and participate in a group discussion while providing support and feedback to their peers.  Therapeutic Goals: Patient will identify triggers that are contributing to a problem in their life Patient will identify unwanted behaviors and feelings associated with a trigger.  Patient will share with other group members strategies to confront and avoid triggers so that they may be able to react appropriately to triggers in daily life.    Summary of Patient Progress: Pt participated appropriately in group. Patient was disorganized at times but was able to be redirected.  Patient participated in urge surfing activity and discussed how meditation and grounding assist with coping with triggers.      Therapeutic Modalities:   Cognitive Behavioral Therapy Solution Focused Therapy Motivational Interviewing Family Systems Approach   Tracee Mccreery Whiting, LCSW, LCAS Clincal Social Worker  [facility]

## 2022-01-02 NOTE — Progress Notes (Signed)
Adult Psychoeducational Group Note  Date:  01/02/2022 Time:  9:29 AM  Group Topic/Focus:  Goals Group:   The focus of this group is to help patients establish daily goals to achieve during treatment and discuss how the patient can incorporate goal setting into their daily lives to aide in recovery.  Participation Level:  Active  Participation Quality:  Appropriate  Affect:  Anxious  Cognitive:  Appropriate  Insight: Appropriate  Engagement in Group:  Engaged  Modes of Intervention:  Discussion  Additional Comments: Patient participated in group.  Octavio Manns 01/02/2022, 9:29 AM

## 2022-01-02 NOTE — Group Note (Signed)
Recreation Therapy Group Note   Group Topic:Communication  Group Date: 01/02/2022 Start Time: 1000 End Time: 1030 Facilitators: Caroll Rancher, LRT,CTRS Location: [address] Day[address]   Goal Area(s) Addresses:  Patient will effectively listen to complete activity.  Patient will identify communication skills used to make activity successful.  Patient will identify how skills used during activity can be used to reach post d/c goals.     Group Description: Geometric Drawings.  Three volunteers from the peer group will be shown an abstract picture with a particular arrangement of geometrical shapes.  Each round, one 'speaker' will describe the pattern, as accurately as possible without revealing the image to the group.  The remaining group members will listen and draw the picture to reflect how it is described to them. Patients with the role of 'listener' cannot ask clarifying questions but, may request that the speaker repeat a direction. Once the drawings are complete, the presenter will show the rest of the group the picture and compare how close each person came to drawing the picture. LRT will facilitate a post-activity discussion regarding effective communication and the importance of planning, listening, and asking for clarification in daily interactions with others.   Affect/Mood: Appropriate   Participation Level: Minimal   Participation Quality: Independent   Behavior: Distracted   Speech/Thought Process: Distracted   Insight: Lacking   Judgement: Lacking    Modes of Intervention: Activity   Patient Response to Interventions:  Disengaged   Education Outcome:  Acknowledges education and In group clarification offered    Clinical Observations/Individualized Feedback: Pt was minimally involved with group.  Pt was talking to himself and not attentive to the activity.  Pt eventually became frustrated and left group.  Pt did not return.    Plan: Continue to engage patient in  RT group sessions 2-3x/week.   Caroll Rancher, LRT,CTRS 01/02/2022 11:42 AM

## 2022-01-02 NOTE — Progress Notes (Signed)
   01/02/22 0515  Sleep  Number of Hours 5.75

## 2022-01-02 NOTE — BH IP Treatment Plan (Signed)
Interdisciplinary Treatment and Diagnostic Plan Update  01/02/2022 Time of Session: 10:00am  [name] MRN: [id number]  Principal Diagnosis: Schizoaffective disorder, bipolar type [facility])  Secondary Diagnoses: Principal Problem:   Schizoaffective disorder, bipolar type (HCC) Active Problems:   Tobacco use disorder   Diabetes (HCC)   Hyperlipidemia   Current Medications:  Current Facility-Administered Medications  Medication Dose Route Frequency Provider Last Rate Last Admin   acetaminophen (TYLENOL) tablet 650 mg  650 mg Oral Q6H PRN Lamar Sprinkles, MD   650 mg at 12/30/21 1856   alum & mag hydroxide-simeth (MAALOX/MYLANTA) 200-200-20 MG/5ML suspension 30 mL  30 mL Oral Q4H PRN Bobbitt, Shalon E, NP   30 mL at 12/20/21 1135   atorvastatin (LIPITOR) tablet 40 mg  40 mg Oral QHS Bobbitt, Shalon E, NP   40 mg at 01/01/22 2032   benztropine (COGENTIN) tablet 0.5 mg  0.5 mg Oral BID PRN Comer Locket, MD       cloZAPine (CLOZARIL) tablet 150 mg  150 mg Oral QHS Lamar Sprinkles, MD   150 mg at 01/01/22 2032   divalproex (DEPAKOTE) DR tablet 500 mg  500 mg Oral Q12H Massengill, Harrold Donath, MD   500 mg at 01/02/22 6387   empagliflozin (JARDIANCE) tablet 10 mg  10 mg Oral Daily Lamar Sprinkles, MD   10 mg at 01/02/22 5643   haloperidol (HALDOL) tablet 5 mg  5 mg Oral Q8H PRN Massengill, Harrold Donath, MD       And   LORazepam (ATIVAN) tablet 1 mg  1 mg Oral Q8H PRN Massengill, Nathan, MD       haloperidol (HALDOL) tablet 7.5 mg  7.5 mg Oral Q12H Massengill, Nathan, MD   7.5 mg at 01/02/22 3295   haloperidol lactate (HALDOL) injection 5 mg  5 mg Intramuscular Q8H PRN Massengill, Harrold Donath, MD       And   LORazepam (ATIVAN) injection 1 mg  1 mg Intramuscular Q8H PRN Massengill, Harrold Donath, MD       hydrOXYzine (ATARAX) tablet 25 mg  25 mg Oral TID PRN Bobbitt, Shalon E, NP   25 mg at 01/01/22 2032   ibuprofen (ADVIL) tablet 800 mg  800 mg Oral BID PRN Comer Locket, MD   800 mg at 01/02/22 0803    magnesium hydroxide (MILK OF MAGNESIA) suspension 30 mL  30 mL Oral Daily PRN Bobbitt, Shalon E, NP   30 mL at 12/10/21 2029   melatonin tablet 5 mg  5 mg Oral QHS PRN Massengill, Harrold Donath, MD   5 mg at 01/01/22 2032   metFORMIN (GLUCOPHAGE) tablet 1,000 mg  1,000 mg Oral BID WC Bobbitt, Shalon E, NP   1,000 mg at 01/02/22 1884   Muscle Rub CREA   Topical PRN Lamar Sprinkles, MD   1 Application at 12/29/21 1125   nicotine (NICODERM CQ - dosed in mg/24 hours) patch 21 mg  21 mg Transdermal Daily Lamar Sprinkles, MD   21 mg at 01/02/22 0754   pantoprazole (PROTONIX) EC tablet 40 mg  40 mg Oral Daily Nwoko, Nicole Kindred I, NP   40 mg at 01/02/22 0752   polyethylene glycol (MIRALAX / GLYCOLAX) packet 17 g  17 g Oral Daily PRN Comer Locket, MD       senna-docusate (Senokot-S) tablet 1 tablet  1 tablet Oral Daily Lamar Sprinkles, MD   1 tablet at 01/02/22 0752   sodium chloride (OCEAN) 0.65 % nasal spray 1 spray  1 spray Each Nare PRN Lamar Sprinkles,  MD   1 spray at 12/25/21 0834   temazepam (RESTORIL) capsule 30 mg  30 mg Oral QHS PRN Comer Locket, MD   30 mg at 01/01/22 2032   traZODone (DESYREL) tablet 100 mg  100 mg Oral QHS PRN Comer Locket, MD   100 mg at 01/01/22 2032   PTA Medications: Medications Prior to Admission  Medication Sig Dispense Refill Last Dose   ABILIFY MAINTENA 400 MG PRSY prefilled syringe Inject 400 mg into the muscle every 28 (twenty-eight) days.      atorvastatin (LIPITOR) 40 MG tablet Take 40 mg by mouth at bedtime.      ibuprofen (ADVIL) 800 MG tablet Take 800 mg by mouth 2 (two) times daily as needed for fever, mild pain or headache.      metFORMIN (GLUCOPHAGE) 500 MG tablet Take 2 tablets (1,000 mg total) by mouth 2 (two) times daily with a meal. 60 tablet 0    naproxen (NAPROSYN) 500 MG tablet Take 1 tablet (500 mg total) by mouth 2 (two) times daily. 10 tablet 0     Patient Stressors: Medication change or noncompliance    Patient Strengths: Capable of  independent living  Motivation for treatment/growth  Supportive family/friends   Treatment Modalities: Medication Management, Group therapy, Case management,  1 to 1 session with clinician, Psychoeducation, Recreational therapy.   Physician Treatment Plan for Primary Diagnosis: Schizoaffective disorder, bipolar type (HCC) Long Term Goal(s): Improvement in symptoms so as ready for discharge   Short Term Goals: Ability to identify changes in lifestyle to reduce recurrence of condition will improve Ability to verbalize feelings will improve Ability to demonstrate self-control will improve Ability to identify and develop effective coping behaviors will improve Compliance with prescribed medications will improve  Medication Management: Evaluate patient's response, side effects, and tolerance of medication regimen.  Therapeutic Interventions: 1 to 1 sessions, Unit Group sessions and Medication administration.  Evaluation of Outcomes: Progressing  Physician Treatment Plan for Secondary Diagnosis: Principal Problem:   Schizoaffective disorder, bipolar type (HCC) Active Problems:   Tobacco use disorder   Diabetes (HCC)   Hyperlipidemia  Long Term Goal(s): Improvement in symptoms so as ready for discharge   Short Term Goals: Ability to identify changes in lifestyle to reduce recurrence of condition will improve Ability to verbalize feelings will improve Ability to demonstrate self-control will improve Ability to identify and develop effective coping behaviors will improve Compliance with prescribed medications will improve     Medication Management: Evaluate patient's response, side effects, and tolerance of medication regimen.  Therapeutic Interventions: 1 to 1 sessions, Unit Group sessions and Medication administration.  Evaluation of Outcomes: Progressing   RN Treatment Plan for Primary Diagnosis: Schizoaffective disorder, bipolar type (HCC) Long Term Goal(s): Knowledge of  disease and therapeutic regimen to maintain health will improve  Short Term Goals: Ability to remain free from injury will improve, Ability to participate in decision making will improve, Ability to verbalize feelings will improve, Ability to disclose and discuss suicidal ideas, and Ability to identify and develop effective coping behaviors will improve  Medication Management: RN will administer medications as ordered by provider, will assess and evaluate patient's response and provide education to patient for prescribed medication. RN will report any adverse and/or side effects to prescribing provider.  Therapeutic Interventions: 1 on 1 counseling sessions, Psychoeducation, Medication administration, Evaluate responses to treatment, Monitor vital signs and CBGs as ordered, Perform/monitor CIWA, COWS, AIMS and Fall Risk screenings as ordered, Perform wound care treatments as  ordered.  Evaluation of Outcomes: Progressing   LCSW Treatment Plan for Primary Diagnosis: Schizoaffective disorder, bipolar type (HCC) Long Term Goal(s): Safe transition to appropriate next level of care at discharge, Engage patient in therapeutic group addressing interpersonal concerns.  Short Term Goals: Engage patient in aftercare planning with referrals and resources, Increase social support, Increase emotional regulation, Facilitate acceptance of mental health diagnosis and concerns, Identify triggers associated with mental health/substance abuse issues, and Increase skills for wellness and recovery  Therapeutic Interventions: Assess for all discharge needs, 1 to 1 time with Social worker, Explore available resources and support systems, Assess for adequacy in community support network, Educate family and significant other(s) on suicide prevention, Complete Psychosocial Assessment, Interpersonal group therapy.  Evaluation of Outcomes: Progressing   Progress in Treatment: Attending groups: Yes. Participating in  groups: Yes. Taking medication as prescribed: Yes. Toleration medication: Yes. Family/Significant other contact made: Yes, individual(s) contacted:   Jaylee Lantry, father,  [phone]  Patient understands diagnosis: No. Discussing patient identified problems/goals with staff: Yes. Medical problems stabilized or resolved: Yes. Denies suicidal/homicidal ideation: Yes. Issues/concerns per patient self-inventory: Yes. Other: none   New problem(s) identified: No, Describe:  none   New Short Term/Long Term Goal(s): Patient to work towards detox, elimination of symptoms of psychosis, medication management for mood stabilization; development of comprehensive mental wellness/sobriety plan.   Patient Goals: No additional goals identified at this time. Patient to continue to work towards original goals identified in initial treatment team meeting. CSW will remain available to patient should they voice additional treatment goals.    Discharge Plan or Barriers: No psychosocial barriers identified at this time, patient to return to place of residence when appropriate for discharge.    Reason for Continuation of Hospitalization: Other; describe psychosis    Estimated Length of Stay: 1-7 days     Last 3 [place] Suicide Severity Risk Score: Flowsheet Row Ad[facility] (Current) [facility] from 11/29/2021 in [facility] EMERGENCY DEPARTMENT ED from 11/28/2021 in [facility] EMERGENCY DEPARTMENT  C-SSRS RISK CATEGORY No Risk No Risk No Risk       Last PHQ 2/9 Scores:    11/02/2021    6:10 PM  Depression screen PHQ 2/9  Decreased Interest 1  Down, Depressed, Hopeless 0  PHQ - 2 Score 1    Scribe for Treatment Team: [name], Theresia Majors 01/02/2022 9:41 AM

## 2022-01-02 NOTE — Progress Notes (Signed)
   01/02/22 2100  Psych Admission Type (Psych Patients Only)  Admission Status Involuntary  Psychosocial Assessment  Patient Complaints None  Eye Contact Fair  Facial Expression Animated  Affect Appropriate to circumstance  Speech Logical/coherent  Interaction Assertive  Motor Activity Restless  Appearance/Hygiene Unremarkable  Behavior Characteristics Cooperative  Mood Suspicious;Preoccupied;Pleasant  Aggressive Behavior  Effect No apparent injury  Thought Chartered certified accountant of ideas;Disorganized  Content Delusions  Delusions Grandeur  Perception Hallucinations  Hallucination Visual;Auditory  Judgment Impaired  Confusion None  Danger to Self  Current suicidal ideation? Denies  Danger to Others  Danger to Others None reported or observed

## 2022-01-02 NOTE — Progress Notes (Signed)
[facility] MD Progress Note   01/02/22 08:02 MIKE HAMRE  [id number]    Subjective:     Barbara Keng is a 46 year old male with a reported past psychiatric history of schizophrenia paranoid, bipolar disorder housing insecurity who presented to [facility] ED on 8/3 under IVC for psychosis and bizarre behaviors.    Yesterday the psychiatry team made the following recommendations: -Increase Haldol to 7.5mg  every 12h for psychosis.  -Continue Clozapine 150 mg qhs -Continue Depakote DR 500mg  bid.  - Patient currently on Abilify Maintena 400 mg LAI, last given 8/16   On assessment today, Avari was seen in his room. He denies SI, HI. He mentioned he had not "heard from his brother" since yesterday because he was "talking to his mom". He mentioned getting a vision during his EKG yesterday. He explains he sometime has the visions, or "de ja vu" but did nto have any since that time yesterday. As such, he has not experienced AVH since yesterday. His speech is less rapid and more coherent than yesterday. He remains tangential, but less so than yesterday and is able to answer all questions appropriately, which is a significant improvement. He still endorses many delusions, such as that he is related to the "[name]" and that he "saw my face in the clouds" and "I looked like [employer] in highschool." He is also endorsing nocturnal enuresis, however no evidence of this has been found by nursing. He was encouraged to report to nursing right after the incident so we could see it and document it. He was amenable to this. He denies all side effects otherwise. He expressed wanting to discharge, "but only when y'all say so."   Review of symptoms Daily review of symptoms, specific for clozapine: Malaise/Sedation: Denies Chest pain: Denies Shortness of breath: Denies Exertional capacity: Denies Tachycardia: Denies Cough: Denies Sore Throat: Denies Fever: Denies Orthostatic hypotension (dizziness with standing):  Denies Hypersalivation: Denies Constipation: Reports last bowel movement was yesterday Symptoms of GERD: Denies Nausea: Denies Nocturnal enuresis: none per nursing    Principal Problem: Schizoaffective disorder, bipolar type (HCC) Diagnosis: Principal Problem:   Schizoaffective disorder, bipolar type (HCC) Active Problems:   Tobacco use disorder   Diabetes (HCC)   Hyperlipidemia  Total Time spent with patient: 25 minutes   Past Psychiatric History:  Previous Psych Diagnoses: Schizophrenia, Schizoaffective disorder-bipolar type Prior inpatient treatment: Multiple including [facility], [facility] 04/2018 Prior outpatient treatment: [facility] 05/2018 History of suicide: History of homicide: Denies Psychiatric medication history: Previously on 6/14 Zyprexa 5 mg qAM and 30 mg qHS; Prolixin 10 mg in 05/2020, Invega Sustenna, PO Invega, and Haldol (02/2020), Neurontin.  Currently taking Abilify Maintena 400 mg and Zyprexa 10 mg BID Psychiatric medication compliance history: Reports compliance, and picking up medications appropriately, per pharmacy records Neuromodulation history: Denies Current Psychiatrist:[facility] services via [facility]   Past Medical History:  Diagnosis Date   Bipolar disorder (HCC)    Bronchitis    Hyperlipidemia    Paranoid schizophrenia (HCC)    Schizophrenia (HCC)     Family Medical History: Family History  Problem Relation Age of Onset   Schizophrenia Neg Hx      Family Psychiatric history: Schizophrenia- Maternal grandfather.   Social History   Socioeconomic History   Marital status: Single    Spouse name: Not on file   Number of children: Not on file   Years of education: 12 years   Highest education level: 12th grade  Occupational History   Occupation:  On disability  Tobacco Use   Smoking status: Every Day    Packs/day: 1.00    Types: Cigarettes   Smokeless tobacco: Never  Vaping Use   Vaping Use: Never used  Substance and Sexual Activity    Alcohol use: Not Currently    Comment: weekly   Drug use: Yes    Types: Marijuana    Comment: smokes hemp   Sexual activity: Yes    Birth control/protection: Condom  Other Topics Concern   Not on file  Social History Narrative   Pt lives alone in apartment complex for mentally ill   Social Determinants of Health   Financial Resource Strain: Not on file  Food Insecurity: Not on file  Transportation Needs: Not on file  Physical Activity: Not on file  Stress: Not on file  Social Connections: Not on file  Intimate Partner Violence: Not on file    Sleep: Fair 5.75h.   Appetite: Fair  Current Medications:   Current Facility-Administered Medications:    acetaminophen (TYLENOL) tablet 650 mg, 650 mg, Oral, Q6H PRN, Rosezetta Schlatter, MD, 650 mg at 12/30/21 1856   alum & mag hydroxide-simeth (MAALOX/MYLANTA) 200-200-20 MG/5ML suspension 30 mL, 30 mL, Oral, Q4H PRN, Bobbitt, Shalon E, NP, 30 mL at 12/20/21 1135   atorvastatin (LIPITOR) tablet 40 mg, 40 mg, Oral, QHS, Bobbitt, Shalon E, NP, 40 mg at 01/01/22 2032   benztropine (COGENTIN) tablet 0.5 mg, 0.5 mg, Oral, BID PRN, Nelda Marseille, Amy E, MD   cloZAPine (CLOZARIL) tablet 150 mg, 150 mg, Oral, QHS, Cosby, Loma Sousa, MD, 150 mg at 01/01/22 2032   divalproex (DEPAKOTE) DR tablet 500 mg, 500 mg, Oral, Q12H, Bralyn Folkert, Ovid Curd, MD, 500 mg at 01/02/22 [id number]   empagliflozin (JARDIANCE) tablet 10 mg, 10 mg, Oral, Daily, Rosezetta Schlatter, MD, 10 mg at 01/02/22 [id number]   haloperidol (HALDOL) tablet 5 mg, 5 mg, Oral, Q8H PRN **AND** [DISCONTINUED] benztropine (COGENTIN) tablet 1 mg, 1 mg, Oral, Q8H PRN **AND** LORazepam (ATIVAN) tablet 1 mg, 1 mg, Oral, Q8H PRN, Lynx Goodrich, Ovid Curd, MD   haloperidol (HALDOL) tablet 7.5 mg, 7.5 mg, Oral, Q12H, Harshika Mago, MD, 7.5 mg at 01/02/22 [id number]   haloperidol lactate (HALDOL) injection 5 mg, 5 mg, Intramuscular, Q8H PRN **AND** [DISCONTINUED] benztropine mesylate (COGENTIN) injection 1 mg, 1 mg, Intramuscular,  Q8H PRN **AND** LORazepam (ATIVAN) injection 1 mg, 1 mg, Intramuscular, Q8H PRN, Edwyna Dangerfield, MD   hydrOXYzine (ATARAX) tablet 25 mg, 25 mg, Oral, TID PRN, Bobbitt, Shalon E, NP, 25 mg at 01/01/22 2032   ibuprofen (ADVIL) tablet 800 mg, 800 mg, Oral, BID PRN, Nelda Marseille, Amy E, MD, 800 mg at 01/01/22 1326   magnesium hydroxide (MILK OF MAGNESIA) suspension 30 mL, 30 mL, Oral, Daily PRN, Bobbitt, Shalon E, NP, 30 mL at 12/10/21 2029   melatonin tablet 5 mg, 5 mg, Oral, QHS PRN, Anya Murphey, Ovid Curd, MD, 5 mg at 01/01/22 2032   metFORMIN (GLUCOPHAGE) tablet 1,000 mg, 1,000 mg, Oral, BID WC, Bobbitt, Shalon E, NP, 1,000 mg at 01/02/22 0752   Muscle Rub CREA, , Topical, PRN, Rosezetta Schlatter, MD, 1 Application at [id number] 1125   nicotine (NICODERM CQ - dosed in mg/24 hours) patch 21 mg, 21 mg, Transdermal, Daily, Cosby, Courtney, MD, 21 mg at 01/02/22 0754   pantoprazole (PROTONIX) EC tablet 40 mg, 40 mg, Oral, Daily, Nwoko, Agnes I, NP, 40 mg at 01/02/22 0752   polyethylene glycol (MIRALAX / GLYCOLAX) packet 17 g, 17 g, Oral, Daily PRN, Harlow Asa, MD   senna-docusate (  Senokot-S) tablet 1 tablet, 1 tablet, Oral, Daily, Rosezetta Schlatter, MD, 1 tablet at 01/02/22 0752   sodium chloride (OCEAN) 0.65 % nasal spray 1 spray, 1 spray, Each Nare, PRN, Rosezetta Schlatter, MD, 1 spray at 12/25/21 0834   temazepam (RESTORIL) capsule 30 mg, 30 mg, Oral, QHS PRN, Nelda Marseille, Amy E, MD, 30 mg at 01/01/22 2032   traZODone (DESYREL) tablet 100 mg, 100 mg, Oral, QHS PRN, Nelda Marseille, Amy E, MD, 100 mg at 01/01/22 2032    Lab results: Results for orders placed or performed during the hospital encounter of 12/07/21 (from the past 48 hour(s))  CBC with Differential/Platelet     Status: Abnormal   Collection Time: 12/31/21  6:20 PM  Result Value Ref Range   WBC 12.5 (H) 4.0 - 10.5 K/uL   RBC 4.78 4.22 - 5.81 MIL/uL   Hemoglobin 14.1 13.0 - 17.0 g/dL   HCT 42.5 39.0 - 52.0 %   MCV 88.9 80.0 - 100.0 fL   MCH 29.5  26.0 - 34.0 pg   MCHC 33.2 30.0 - 36.0 g/dL   RDW 12.4 11.5 - 15.5 %   Platelets 355 150 - 400 K/uL   nRBC 0.0 0.0 - 0.2 %   Neutrophils Relative % 69 %   Neutro Abs 8.8 (H) 1.7 - 7.7 K/uL   Lymphocytes Relative 22 %   Lymphs Abs 2.8 0.7 - 4.0 K/uL   Monocytes Relative 5 %   Monocytes Absolute 0.6 0.1 - 1.0 K/uL   Eosinophils Relative 2 %   Eosinophils Absolute 0.3 0.0 - 0.5 K/uL   Basophils Relative 1 %   Basophils Absolute 0.1 0.0 - 0.1 K/uL   Immature Granulocytes 1 %   Abs Immature Granulocytes 0.06 0.00 - 0.07 K/uL    Comment: Performed at [facility], [place] [address] [place], [place] [address]  Glucose, capillary     Status: Abnormal   Collection Time: 01/01/22  6:09 AM  Result Value Ref Range   Glucose-Capillary 100 (H) 70 - 99 mg/dL    Comment: Glucose reference range applies only to samples taken after fasting for at least 8 hours.  CBC with Differential/Platelet     Status: Abnormal   Collection Time: 01/01/22  6:24 AM  Result Value Ref Range   WBC 10.6 (H) 4.0 - 10.5 K/uL   RBC 4.82 4.22 - 5.81 MIL/uL   Hemoglobin 14.6 13.0 - 17.0 g/dL   HCT 42.5 39.0 - 52.0 %   MCV 88.2 80.0 - 100.0 fL   MCH 30.3 26.0 - 34.0 pg   MCHC 34.4 30.0 - 36.0 g/dL   RDW 12.5 11.5 - 15.5 %   Platelets 354 150 - 400 K/uL   nRBC 0.0 0.0 - 0.2 %   Neutrophils Relative % 70 %   Neutro Abs 7.4 1.7 - 7.7 K/uL   Lymphocytes Relative 22 %   Lymphs Abs 2.3 0.7 - 4.0 K/uL   Monocytes Relative 5 %   Monocytes Absolute 0.5 0.1 - 1.0 K/uL   Eosinophils Relative 2 %   Eosinophils Absolute 0.2 0.0 - 0.5 K/uL   Basophils Relative 0 %   Basophils Absolute 0.0 0.0 - 0.1 K/uL   Immature Granulocytes 1 %   Abs Immature Granulocytes 0.05 0.00 - 0.07 K/uL    Comment: Performed at [facility], [place] [address] [place], [place] [address]  C-reactive protein     Status: None   Collection Time: 01/01/22  6:24 AM  Result Value Ref Range   CRP 0.7 <1.0 mg/dL     Comment: Performed at [place] [address] [place], [place] [address]  Troponin I (High Sensitivity)     Status: None   Collection Time: 01/01/22  6:24 AM  Result Value Ref Range   Troponin I (High Sensitivity) 3 <18 ng/L    Comment: (NOTE) Elevated high sensitivity troponin I (hsTnI) values and significant  changes across serial measurements may suggest ACS but many other  chronic and acute conditions are known to elevate hsTnI results.  Refer to the "Links" section for chest pain algorithms and additional  guidance. Performed at [facility], [place] [address], [place], [place] [address]   Glucose, capillary     Status: Abnormal   Collection Time: 01/02/22  5:45 AM  Result Value Ref Range   Glucose-Capillary 115 (H) 70 - 99 mg/dL    Comment: Glucose reference range applies only to samples taken after fasting for at least 8 hours.   . Physical Exam: Psychiatric Specialty Exam: Physical Exam Neurological:     Mental Status: He is alert.  Psychiatric:        Attention and Perception: Attention and perception normal.        Mood and Affect: Mood and affect normal.        Speech: Speech normal.        Behavior: Behavior normal. Behavior is cooperative.        Thought Content: Thought content is delusional.        Cognition and Memory: Memory normal. Impaired cognition: unsure if his delusions are impairing congnition..     Review of Systems  Respiratory:  Negative for cough and shortness of breath.   Cardiovascular:  Negative for chest pain.  Gastrointestinal:  Negative for constipation, diarrhea, nausea and vomiting.  Neurological:  Negative for dizziness, tremors, facial asymmetry, speech difficulty, weakness, light-headedness and headaches.  Psychiatric/Behavioral:  Positive for hallucinations. Negative for suicidal ideas.     Blood pressure 107/72, pulse 98, temperature (!) 97.4 F (36.3 C), temperature source Oral, resp. rate 16, height 6\' 1"   (1.854 m), weight 117.9 kg, SpO2 98 %.Body mass index is 34.3 kg/m.  General Appearance: Fairly Groomed  Eye Contact:  Fair  Speech:  Clear and Coherent  Volume:  Normal  Mood:  Euthymic "pretty good"  Affect:  Congruent  Thought Process:  Disorganized  Orientation:  Other:  Alert to person, place  Thought Content:  Delusions and Tangential  Suicidal Thoughts:  No  Homicidal Thoughts:  No  Memory:  Some difficulty, otherwise intact   Judgement:  Impaired  Insight:  Lacking  Psychomotor Activity:  Negative  Concentration:  Concentration: Fair  Recall:  [employer] of Knowledge:  Fair  Language:  Good  Akathisia:  No  Handed:  Right  AIMS (if indicated):  0   Assets:   Desire for Improvement Resilience  ADL's:  Intact  Cognition:  Intact  Sleep:  Number of Hours: 5.75    ASSESSMENT: Principal Problem:   Schizoaffective disorder, bipolar type ([place]) Active Problems: Tobacco use disorder Type 2 DM Hyperlipidemia   PLAN: Safety and Monitoring:             -- Involuntary admission to inpatient psychiatric unit for safety, stabilization and treatment             -- Daily contact with patient to assess and evaluate symptoms and progress in treatment             --  Patient's case to be discussed in multi-disciplinary team meeting             -- Observation Level : q15 minute checks             -- Vital signs:  q12 hours             -- Precautions: suicide, elopement, and assault   2. Psychiatric Diagnoses and Treatment:  # Schizoaffective disorder - bipolar type -- Continue Haldol 7.5 mg every 12 hours for psychosis  -- Patient currently on Abilify Maintena 400 mg LAI, last given 8/16 -- Continue clozapine 150 mg qhs for psychosis   --Continue Depakote DR 500 mg bid - for mood stabilization  (VPA level 40 8/30) - not currently manic or agitated so will hold on further dose titration at this time given previous issues with elevated ammonia at higher dose -- Metabolic  profile and EKG monitoring obtained while on antipsychotics  BMI: 34.3 Lipid Panel: WNL other than TG 172 HbgA1c: 6.9% QTc: on 12/30/21 -- Encouraged patient to participate in unit milieu and in scheduled group therapies  -- CK 173 on 8/31- currently afebrile and VSS; continue to monitor with Clozaril titration  -- Scheduled Senna 8.6 daily with dose titration up on Clozaril and monitoring bowel function --Troponin I was 3 on 01/01/22, Continue to monitor as we titrate up on Clozaril. Order immediately if chest pain.     3. Medical Issues Being Addressed: #Tobacco use disorder - Continue Nicotine patch 21 mg daily   #T2DM -- Due to the fact that patient is actively psychotic, only completing a.m. FSBS; will resume.  Patient on home regimen and diabetes well-controlled (A1c 6.9%), so minimal concern for decompensation. - Continue Metformin 1000 mg twice daily with meals - Continue Jardiance 10 mg every morning - A.m. CBG monitoring only   #Hyperlipidemia - Continue home atorvastatin 40 mg daily    #Acid reflux.  - Continue Protonix 40 mg po Q am    4. Discharge Planning:  -- Social work and case management to assist with discharge planning and identification of hospital follow-up needs prior to discharge -- Discharge Concerns: Need to establish a safety plan; Medication compliance and effectiveness -- Discharge Goals: Return home with outpatient referrals for mental health follow-up including medication management/psychotherapy  [place] OMS-IV   Total Time Spent in Direct Patient Care:  I personally spent 30 minutes on the unit in direct patient care. The direct patient care time included face-to-face time with the patient, reviewing the patient's chart, communicating with other professionals, and coordinating care. Greater than 50% of this time was spent in counseling or coordinating care with the patient regarding goals of hospitalization, psycho-education, and discharge  planning needs.  I personally was present and performed or re-performed the history, physical exam and medical decision-making activities of this service and have verified that the service and findings are accurately documented in the student's note, , as addended by me or notated below:  I directly edited the note, as above. Pt is doing better, less overtly psychotic. Likely approaching baseline, for the first day since admission. We need to monitor if this is sustained response to treatment or a blip.    Phineas Inches, MD Psychiatrist

## 2022-01-02 NOTE — Progress Notes (Signed)
[facility] Group Notes:  (Nursing/MHT/Case Management/Adjunct)  Date:  01/02/2022  Time: 2000  Type of Therapy:   wrap up group  Participation Level:  Active  Participation Quality:  Appropriate, Attentive, and Sharing  Affect:  Flat  Cognitive:  Alert  Insight:  Improving  Engagement in Group:  Engaged  Modes of Intervention:  Clarification, Education, and Support  Summary of Progress/Problems: Positive thinking and positive change were discussed.   [name] 01/02/2022, 8:57 PM

## 2022-01-03 DIAGNOSIS — F250 Schizoaffective disorder, bipolar type: Secondary | ICD-10-CM | POA: Diagnosis not present

## 2022-01-03 LAB — GLUCOSE, CAPILLARY: Glucose-Capillary: 125 mg/dL — ABNORMAL HIGH (ref 70–99)

## 2022-01-03 MED ORDER — TEMAZEPAM 7.5 MG PO CAPS
22.5000 mg | ORAL_CAPSULE | Freq: Every evening | ORAL | Status: DC | PRN
  Administered 2022-01-03: 22.5 mg via ORAL
  Filled 2022-01-03: qty 3

## 2022-01-03 MED ORDER — HALOPERIDOL 5 MG PO TABS
10.0000 mg | ORAL_TABLET | Freq: Two times a day (BID) | ORAL | Status: DC
  Administered 2022-01-03 – 2022-01-09 (×12): 10 mg via ORAL
  Filled 2022-01-03 (×16): qty 2
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
## 2022-01-03 NOTE — Progress Notes (Signed)
Psychoeducational Group Note  Date:  01/03/2022 Time:  2000  Group Topic/Focus:  Wrap up group  Participation Level: Did Not Attend  Participation Quality:  Not Applicable  Affect:  Not Applicable  Cognitive:  Not Applicable  Insight:  Not Applicable  Engagement in Group: Not Applicable  Additional Comments:  Did not attend.   Marcille Buffy 01/03/2022, 8:19 PM

## 2022-01-03 NOTE — Progress Notes (Signed)
[facility] MD Progress Note   01/03/22 08:27 KASPAR ALBORNOZ  [id number]    Subjective:     [name] is a 46 year old male with a reported past psychiatric history of schizophrenia paranoid, bipolar disorder housing insecurity who presented to [facility] ED on 8/3 under IVC for psychosis and bizarre behaviors.    Yesterday the psychiatry team made the following recommendations: -continue Haldol to 7.5mg  every 12h for psychosis.  -Continue Clozapine 150 mg qhs -Continue Depakote DR 500mg  bid.  - Patient currently on Abilify Maintena 400 mg LAI, last given 8/16   On assessment today, Stefen was seen in his room. He denies SI, HI. He endorses no AVH since his "vision" while getting his ECG 2 days ago. He still endorses nocturnal enuresis, but admits he had this problem a while back due to "my testicle being up inside me" and it "could be a flashback". He states his legs feel heavy again, but otherwise denies other side effects. He continues to endorse delusions, such as "[name] might be my dad" and "a while back a ninja came out of the ground and told me he was the real devil' and other bizarre delusions. He speech is coherent. He remains tangential, but answers direct questions before his tangents and is less internally distractible.    Review of symptoms Daily review of symptoms, specific for clozapine: Malaise/Sedation: Denies Chest pain: Denies Shortness of breath: Denies Exertional capacity: Denies Tachycardia: Denies Cough: Denies Sore Throat: Denies Fever: Denies Orthostatic hypotension (dizziness with standing): Denies Hypersalivation: Denies Constipation: Reports last bowel movement was this AM Symptoms of GERD: Denies Nausea: Denies Nocturnal enuresis: none per nursing    Principal Problem: Schizoaffective disorder, bipolar type (HCC) Diagnosis: Principal Problem:   Schizoaffective disorder, bipolar type (HCC) Active Problems:   Tobacco use disorder   Diabetes (HCC)    Hyperlipidemia   Total Time spent with patient: 25 minutes   Past Psychiatric History:  Previous Psych Diagnoses: Schizophrenia, Schizoaffective disorder-bipolar type Prior inpatient treatment: [facility], [facility] 04/2018 Prior outpatient treatment: [facility] 05/2018 History of suicide: History of homicide: Denies Psychiatric medication history: Previously on 6/14 Zyprexa 5 mg qAM and 30 mg qHS; Prolixin 10 mg in 05/2020, Invega Sustenna, PO Invega, and Haldol (02/2020), Neurontin.  Currently taking Abilify Maintena 400 mg and Zyprexa 10 mg BID Psychiatric medication compliance history: Reports compliance, and picking up medications appropriately, per pharmacy records Neuromodulation history: Denies Current Psychiatrist:[facility] services via [facility]   Past Medical History:  Diagnosis Date   Bipolar disorder (HCC)    Bronchitis    Hyperlipidemia    Paranoid schizophrenia (HCC)    Schizophrenia (HCC)     Family Medical History: Family History  Problem Relation Age of Onset   Schizophrenia Neg Hx      Family Psychiatric history: Schizophrenia- Maternal grandfather.   Social History   Socioeconomic History   Marital status: Single    Spouse name: Not on file   Number of children: Not on file   Years of education: 12 years   Highest education level: 12th grade  Occupational History   Occupation: On disability  Tobacco Use   Smoking status: Every Day    Packs/day: 1.00    Types: Cigarettes   Smokeless tobacco: Never  Vaping Use   Vaping Use: Never used  Substance and Sexual Activity   Alcohol use: Not Currently    Comment: weekly   Drug use: Yes    Types: Marijuana  Comment: smokes hemp   Sexual activity: Yes    Birth control/protection: Condom  Other Topics Concern   Not on file  Social History Narrative   Pt lives alone in apartment complex for mentally ill   Social Determinants of Health   Financial Resource Strain: Not on file  Food  Insecurity: Not on file  Transportation Needs: Not on file  Physical Activity: Not on file  Stress: Not on file  Social Connections: Not on file  Intimate Partner Violence: Not on file    Sleep: Fair 7.5 h.   Appetite: Fair  Current Medications:   Current Facility-Administered Medications:    acetaminophen (TYLENOL) tablet 650 mg, 650 mg, Oral, Q6H PRN, Lamar Sprinkles, MD, 650 mg at 12/30/21 1856   alum & mag hydroxide-simeth (MAALOX/MYLANTA) 200-200-20 MG/5ML suspension 30 mL, 30 mL, Oral, Q4H PRN, Bobbitt, Shalon E, NP, 30 mL at 12/20/21 1135   atorvastatin (LIPITOR) tablet 40 mg, 40 mg, Oral, QHS, Bobbitt, Shalon E, NP, 40 mg at 01/02/22 2028   benztropine (COGENTIN) tablet 0.5 mg, 0.5 mg, Oral, BID PRN, Mason Jim, Amy E, MD   cloZAPine (CLOZARIL) tablet 150 mg, 150 mg, Oral, QHS, Cosby, Toni Amend, MD, 150 mg at 01/02/22 2029   divalproex (DEPAKOTE) DR tablet 500 mg, 500 mg, Oral, Q12H, Kavina Cantave, Harrold Donath, MD, 500 mg at 01/03/22 4142   empagliflozin (JARDIANCE) tablet 10 mg, 10 mg, Oral, Daily, Lamar Sprinkles, MD, 10 mg at 01/03/22 3953   haloperidol (HALDOL) tablet 5 mg, 5 mg, Oral, Q8H PRN **AND** [DISCONTINUED] benztropine (COGENTIN) tablet 1 mg, 1 mg, Oral, Q8H PRN **AND** LORazepam (ATIVAN) tablet 1 mg, 1 mg, Oral, Q8H PRN, Reem Fleury, Harrold Donath, MD   haloperidol (HALDOL) tablet 7.5 mg, 7.5 mg, Oral, Q12H, Sherion Dooly, MD, 7.5 mg at 01/03/22 2023   haloperidol lactate (HALDOL) injection 5 mg, 5 mg, Intramuscular, Q8H PRN **AND** [DISCONTINUED] benztropine mesylate (COGENTIN) injection 1 mg, 1 mg, Intramuscular, Q8H PRN **AND** LORazepam (ATIVAN) injection 1 mg, 1 mg, Intramuscular, Q8H PRN, Asyria Kolander, MD   hydrOXYzine (ATARAX) tablet 25 mg, 25 mg, Oral, TID PRN, Bobbitt, Shalon E, NP, 25 mg at 01/02/22 2029   ibuprofen (ADVIL) tablet 800 mg, 800 mg, Oral, BID PRN, Mason Jim, Amy E, MD, 800 mg at 01/02/22 0803   magnesium hydroxide (MILK OF MAGNESIA) suspension 30 mL,  30 mL, Oral, Daily PRN, Bobbitt, Shalon E, NP, 30 mL at 12/10/21 2029   melatonin tablet 5 mg, 5 mg, Oral, QHS PRN, Brentney Goldbach, Harrold Donath, MD, 5 mg at 01/02/22 2029   metFORMIN (GLUCOPHAGE) tablet 1,000 mg, 1,000 mg, Oral, BID WC, Bobbitt, Shalon E, NP, 1,000 mg at 01/03/22 3435   Muscle Rub CREA, , Topical, PRN, Lamar Sprinkles, MD, 1 Application at 12/29/21 1125   nicotine (NICODERM CQ - dosed in mg/24 hours) patch 21 mg, 21 mg, Transdermal, Daily, Cosby, Courtney, MD, 21 mg at 01/03/22 0823   pantoprazole (PROTONIX) EC tablet 40 mg, 40 mg, Oral, Daily, Nwoko, Agnes I, NP, 40 mg at 01/03/22 0823   polyethylene glycol (MIRALAX / GLYCOLAX) packet 17 g, 17 g, Oral, Daily PRN, Mason Jim, Amy E, MD   senna-docusate (Senokot-S) tablet 1 tablet, 1 tablet, Oral, Daily, Cosby, Toni Amend, MD, 1 tablet at 01/03/22 0823   sodium chloride (OCEAN) 0.65 % nasal spray 1 spray, 1 spray, Each Nare, PRN, Lamar Sprinkles, MD, 1 spray at 12/25/21 0834   temazepam (RESTORIL) capsule 30 mg, 30 mg, Oral, QHS PRN, Mason Jim, Amy E, MD, 30 mg at 01/02/22 2028  traZODone (DESYREL) tablet 100 mg, 100 mg, Oral, QHS PRN, Nelda Marseille, Amy E, MD, 100 mg at 01/02/22 2028    Lab results: Results for orders placed or performed during the hospital encounter of 12/07/21 (from the past 48 hour(s))  Glucose, capillary     Status: Abnormal   Collection Time: 01/02/22  5:45 AM  Result Value Ref Range   Glucose-Capillary 115 (H) 70 - 99 mg/dL    Comment: Glucose reference range applies only to samples taken after fasting for at least 8 hours.  Glucose, capillary     Status: Abnormal   Collection Time: 01/03/22  6:09 AM  Result Value Ref Range   Glucose-Capillary 125 (H) 70 - 99 mg/dL    Comment: Glucose reference range applies only to samples taken after fasting for at least 8 hours.   Comment 1 Notify RN    Comment 2 Document in Chart    . Physical Exam: Psychiatric Specialty Exam: Physical Exam Pulmonary:     Effort: Pulmonary  effort is normal.  Neurological:     Mental Status: He is alert.     Motor: No weakness.     Gait: Gait normal.  Psychiatric:        Behavior: Behavior normal.     Review of Systems  Constitutional:  Negative for activity change and appetite change.  Psychiatric/Behavioral:  Positive for hallucinations. Negative for agitation, behavioral problems, dysphoric mood, self-injury and suicidal ideas. The patient is not nervous/anxious.     Blood pressure 121/76, pulse (!) 107, temperature 98 F (36.7 C), temperature source Oral, resp. rate 18, height 6\' 1"  (1.854 m), weight 117.9 kg, SpO2 98 %.Body mass index is 34.3 kg/m.  General Appearance: Fairly Groomed  Eye Contact:  Fair  Speech:  Clear and Coherent  Volume:  Normal  Mood:  Euthymic "pretty good"  Affect:  Congruent  Thought Process:  Disorganized  Orientation:  Other:  Place and Person  Thought Content:  Delusions  Suicidal Thoughts:  No  Homicidal Thoughts:  No  Memory:  Full memory intact.   Judgement:  Poor  Insight:  Lacking  Psychomotor Activity:  Normal  Concentration:  Concentration: Good  Recall:  Good  Fund of Knowledge:  Fair  Language:  Good  Akathisia:  No  Handed:  Right  AIMS (if indicated):  0   Assets:   Physical Health Resilience  ADL's:  Intact  Cognition:  Intact  Sleep:  Number of Hours: 7.5    ASSESSMENT: Principal Problem:   Schizoaffective disorder, bipolar type ([place]) Active Problems: Tobacco use disorder Type 2 DM Hyperlipidemia   PLAN: Safety and Monitoring:             -- Involuntary admission to inpatient psychiatric unit for safety, stabilization and treatment             -- Daily contact with patient to assess and evaluate symptoms and progress in treatment             -- Patient's case to be discussed in multi-disciplinary team meeting             -- Observation Level : q15 minute checks             -- Vital signs:  q12 hours             -- Precautions: suicide, elopement,  and assault   2. Psychiatric Diagnoses and Treatment:  # Schizoaffective disorder - bipolar type -- Increase Haldol to 10 mg  every 12 hours for psychosis  -- Patient currently on Abilify Maintena 400 mg LAI, last given 8/16 -- Continue clozapine 150 mg qhs for psychosis   --Continue Depakote DR 500 mg bid - for mood stabilization  (VPA level 40 8/30) - not currently manic or agitated so will hold on further dose titration at this time given previous issues with elevated ammonia at higher dose -- Metabolic profile and EKG monitoring obtained while on antipsychotics  BMI: 34.3 Lipid Panel: WNL other than TG 172 HbgA1c: 6.9% QTc: on 12/30/21 -- Encouraged patient to participate in unit milieu and in scheduled group therapies  -- CK 173 on 8/31- currently afebrile and VSS; continue to monitor with Clozaril titration  -- Scheduled Senna 8.6 daily with dose titration up on Clozaril and monitoring bowel function --Troponin I was 3 on 01/01/22, Continue to monitor as we titrate up on Clozaril. Order immediately if chest pain.     3. Medical Issues Being Addressed: #Tobacco use disorder - Continue Nicotine patch 21 mg daily   #T2DM -- Due to the fact that patient is actively psychotic, only completing a.m. FSBS; will resume.  Patient on home regimen and diabetes well-controlled (A1c 6.9%), so minimal concern for decompensation. - Continue Metformin 1000 mg twice daily with meals - Continue Jardiance 10 mg every morning - A.m. CBG monitoring only   #Hyperlipidemia - Continue home atorvastatin 40 mg daily    #Acid reflux.  - Continue Protonix 40 mg po Q am    4. Discharge Planning:  -- Social work and case management to assist with discharge planning and identification of hospital follow-up needs prior to discharge -- Discharge Concerns: Need to establish a safety plan; Medication compliance and effectiveness -- Discharge Goals: Return home with outpatient referrals for mental health  follow-up including medication management/psychotherapy   [place] OMS-IV   Total Time Spent in Direct Patient Care:  I personally spent 35 minutes on the unit in direct patient care. The direct patient care time included face-to-face time with the patient, reviewing the patient's chart, communicating with other professionals, and coordinating care. Greater than 50% of this time was spent in counseling or coordinating care with the patient regarding goals of hospitalization, psycho-education, and discharge planning needs.  I personally was present and performed or re-performed the history, physical exam and medical decision-making activities of this service and have verified that the service and findings are accurately documented in the student's note, , as addended by me or notated below:  I directly edited the note, as above.   Phineas Inches, MD Psychiatrist

## 2022-01-03 NOTE — Progress Notes (Signed)
   01/03/22 1000  Psych Admission Type (Psych Patients Only)  Admission Status Involuntary  Psychosocial Assessment  Patient Complaints None  Eye Contact Fair  Facial Expression Animated  Affect Appropriate to circumstance  Speech Logical/coherent  Interaction Assertive  Motor Activity Pacing  Appearance/Hygiene Unremarkable  Behavior Characteristics Cooperative  Mood Preoccupied  Thought Process  Coherency Disorganized;Flight of ideas  Content Delusions  Delusions Grandeur  Perception Hallucinations  Hallucination Auditory;Visual  Judgment Impaired  Confusion None  Danger to Self  Current suicidal ideation? Denies  Danger to Others  Danger to Others None reported or observed

## 2022-01-03 NOTE — Progress Notes (Addendum)
  Writer tried to tell pt to not throw away his shoes, but pt stated"I have plenty of shoes I don't need those old shoes"    01/03/22 2045  Psych Admission Type (Psych Patients Only)  Admission Status Involuntary  Psychosocial Assessment  Patient Complaints None  Eye Contact Fair  Facial Expression Animated  Affect Appropriate to circumstance  Speech Logical/coherent  Interaction Assertive  Motor Activity Restless  Appearance/Hygiene Unremarkable  Behavior Characteristics Cooperative  Mood Suspicious;Preoccupied  Aggressive Behavior  Effect No apparent injury  Thought Chartered certified accountant of ideas;Disorganized  Content Delusions  Delusions Grandeur  Perception Hallucinations  Hallucination Visual;Auditory  Judgment Impaired  Confusion None  Danger to Self  Current suicidal ideation? Denies  Danger to Others  Danger to Others None reported or observed

## 2022-01-03 NOTE — Progress Notes (Signed)
Coming on shift writer found pt shoes in the trash can, they were given back to pt and educated on keeping them so he could walk on the unit with them. Pt got upset and stated he was just going to throw them away as soon as he D/ D, Clinical research associate informed pt that we tried to encourage pt to keep his shoes, but he got upset, so Clinical research associate informed pt if he wanted to throw them away that was his right, but we did not want to hear anything about the shoes from him or his family.

## 2022-01-03 NOTE — Progress Notes (Signed)
   01/03/22 0500  Sleep  Number of Hours 7.5    

## 2022-01-04 LAB — GLUCOSE, CAPILLARY: Glucose-Capillary: 121 mg/dL — ABNORMAL HIGH (ref 70–99)

## 2022-01-04 MED ORDER — TEMAZEPAM 15 MG PO CAPS
15.0000 mg | ORAL_CAPSULE | Freq: Every evening | ORAL | Status: DC | PRN
  Administered 2022-01-04 – 2022-01-08 (×5): 15 mg via ORAL
  Filled 2022-01-04 (×5): qty 1

## 2022-01-04 NOTE — Group Note (Signed)
[facility] LCSW Group Therapy Note   Group Date: 01/04/2022 Start Time: 1115 End Time: 1215   Type of Therapy and Topic: Group Therapy: Avoiding Self-Sabotaging and Enabling Behaviors  Participation Level: Minimal  Mood: Blunted   Description of Group:  In this group, patients will learn how to identify obstacles, self-sabotaging and enabling behaviors, as well as: what are they, why do we do them and what needs these behaviors meet. Discuss unhealthy relationships and how to have positive healthy boundaries with those that sabotage and enable. Explore aspects of self-sabotage and enabling in yourself and how to limit these self-destructive behaviors in everyday life.   Therapeutic Goals: 1. Patient will identify one obstacle that relates to self-sabotage and enabling behaviors 2. Patient will identify one personal self-sabotaging or enabling behavior they did prior to admission 3. Patient will state a plan to change the above identified behavior 4. Patient will demonstrate ability to communicate their needs through discussion and/or role play.    Summary of Patient Progress:   Patient was present for the entirety of group session. Patient participated in opening and closing remarks. However, patient did not contribute at all to the topic of discussion despite encouraged participation.    Therapeutic Modalities:  Cognitive Behavioral Therapy Person-Centered Therapy Motivational Interviewing    Lat[place]ia [place] W Reola Buckles, LCSWA 

## 2022-01-04 NOTE — Progress Notes (Signed)
   01/04/22 1000  Psych Admission Type (Psych Patients Only)  Admission Status Involuntary  Psychosocial Assessment  Patient Complaints None  Eye Contact Fair  Facial Expression Animated  Affect Appropriate to circumstance  Speech Logical/coherent  Interaction Assertive  Motor Activity Pacing  Appearance/Hygiene Unremarkable  Behavior Characteristics Cooperative  Mood Preoccupied  Thought Process  Coherency Disorganized;Flight of ideas  Content Delusions  Delusions Grandeur  Perception Hallucinations  Hallucination Auditory;Visual  Judgment Impaired  Confusion None  Danger to Self  Current suicidal ideation? Denies  Danger to Others  Danger to Others None reported or observed

## 2022-01-04 NOTE — [facility] Group Notes (Signed)
Adult Psychoeducational Group Note  Date:  01/04/2022 Time:  8:18 PM  Group Topic/Focus:  Wrap-Up Group:   The focus of this group is to help patients review their daily goal of treatment and discuss progress on daily workbooks.  Participation Level:  Active  Participation Quality:  Appropriate and Attentive  Affect:  Appropriate  Cognitive:  Alert and Appropriate  Insight: Appropriate and Good  Engagement in Group:  Engaged  Modes of Intervention:  Discussion and Education  Additional Comments:  Pt attended and participated in wrap up group this evening and rated their day a 10/10. Pt states that they always have a good day and they went to two good groups. Pt goal is to be discharged and they anticipate being discharged next week. Pt states that when they discharge they would like to move and go to a new environment away from drug users.   Chrisandra Netters 01/04/2022, 8:18 PM
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
## 2022-01-04 NOTE — [facility] Suicide Risk Assessment (Signed)
[facility]/BMU LCSW Progress Note   01/04/2022    1:20 PM  [name]   [id number]   Type of Contact and Topic:  family contact   CSW attempted to reach [name] in order to get collateral information about patient's living situation prior to admission. Father not reached, Left HIPAA compliant voicemail with contact information and callback request.  CSW team will make additional attempts to reach family.     Signed:  Corky Crafts, MSW, LCSWA, LCAS 01/04/2022 1:20 PM

## 2022-01-04 NOTE — Progress Notes (Signed)
   01/04/22 1930  Psych Admission Type (Psych Patients Only)  Admission Status Involuntary  Psychosocial Assessment  Patient Complaints None  Eye Contact Fair  Facial Expression Animated  Affect Appropriate to circumstance  Speech Logical/coherent  Interaction Assertive  Motor Activity Restless  Appearance/Hygiene Unremarkable  Behavior Characteristics Cooperative  Mood Suspicious;Preoccupied  Aggressive Behavior  Effect No apparent injury  Thought Chartered certified accountant of ideas;Disorganized  Content Delusions  Delusions Grandeur  Perception Hallucinations  Hallucination Visual;Auditory  Judgment Impaired  Confusion None  Danger to Self  Current suicidal ideation? Denies  Danger to Others  Danger to Others None reported or observed

## 2022-01-04 NOTE — Progress Notes (Signed)
   01/04/22 0530  Sleep  Number of Hours 7.5

## 2022-01-04 NOTE — [facility] Group Notes (Signed)
Spirituality group facilitated by Kathleen Argue, BCC.   Group Description: Group focused on topic of hope. Patients participated in facilitated discussion around topic, connecting with one another around experiences and definitions for hope. Group members engaged with visual explorer photos, reflecting on what hope looks like for them today. Group engaged in discussion around how their definitions of hope are present today in hospital.   Modalities: Psycho-social ed, Adlerian, Narrative, MI   Patient Progress: Haston attended group but was very sleepy and had his eyes closed for most of group.  He did join in the conversation toward the end and engaged.  [address], Bcc Pager, [phone]

## 2022-01-04 NOTE — Group Note (Signed)
Recreation Therapy Group Note   Group Topic:Leisure Education  Group Date: 01/04/2022 Start Time: 1000 End Time: 1030 Facilitators: Caroll Rancher, LRT,CTRS Location: [address] Dayroom   Goal Area(s) Addresses:  Patient will effectively work with peer towards shared goal.  Patient will identify skills used to make activity successful.  Patient will identify how skills used during activity can be used to reach post d/c goals.   Group Description: Straw Bridge. In teams of 3-5, patients were given 15 plastic drinking straws and an equal length of masking tape. Using the materials provided, patients were instructed to build a free standing bridge-like structure to suspend an everyday item (ex: puzzle box) off of the floor or table surface. All materials were required to be used by the team in their design. LRT facilitated post-activity discussion reviewing team process. Patients were encouraged to reflect how the skills used in this activity can be generalized to daily life post discharge.    Affect/Mood: N/A   Participation Level: Did not attend    Clinical Observations/Individualized Feedback:     Plan: Continue to engage patient in RT group sessions 2-3x/week.   Caroll Rancher, LRT,CTRS  01/04/2022 12:04 PM

## 2022-01-05 LAB — GLUCOSE, CAPILLARY: Glucose-Capillary: 97 mg/dL (ref 70–99)

## 2022-01-05 MED ORDER — DIVALPROEX SODIUM ER 500 MG PO TB24
750.0000 mg | ORAL_TABLET | Freq: Two times a day (BID) | ORAL | Status: DC
  Administered 2022-01-05 – 2022-01-09 (×9): 750 mg via ORAL
  Filled 2022-01-05 (×11): qty 1

## 2022-01-05 NOTE — [facility] Group Notes (Signed)
Goals Group 01/05/2022   Group Focus: affirmation, clarity of thought, and goals/reality orientation Treatment Modality:  Psychoeducation Interventions utilized were assignment, group exercise, and support Purpose: To be able to understand and verbalize the reason for their admission to the hospital. To understand that the medication helps with their chemical imbalance but they also need to work on their choices in life. To be challenged to develop a list of 30 positives about themselves. Also introduce the concept that "feelings" are not reality.  Participation Level:  Active  Participation Quality:  Appropriate  Affect:  Appropriate  Cognitive:  Appropriate  Insight:  Improving  Engagement in Group:  Engaged  Additional Comments:  Rates his energy at a 10/10. Pt is not talking out loud in group today. Has responded to the questions asked. States he is still depressed  [name] A
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
## 2022-01-05 NOTE — Progress Notes (Signed)
Pt is pacing hallway and speaking to unseen others. He does not appear aggressive.

## 2022-01-05 NOTE — Group Note (Signed)
[facility] LCSW Group Therapy Note  Date/Time:    01/05/2022 10:00-11:00AM  Type of Therapy and Topic:  Group Therapy:  Shame and its Impact on My Life  Participation Level:  Minimal   Description of Group:  The focus of this group was to examine our tendency to be hyper-critical of self and how this leads to feelings of worthlessness, hopelessness, and shame.  Patients were guided to the concept that shame is universal and is worsened by being kept hidden, but improved by being revealed.  We discussed how feeling unworthy is the result of shame and discussed the differences between guilt  ("I did something bad" "I made a mistake" "I did something stupid") and shame ("I am bad" "I am a mistake" "I am stupid") .  We discussed that feelings are not necessarily based in facts.  We also talked about what happens when we do something to numb our negative feelings and how that actually numbs our positive feelings at the same time.  Therapeutic Goals Identify statements patients automatically say to themselves, "I'll be worthy when...."  Examine how this is unkind to ourselves because it indicates we cannot be worthy until some far-reaching, possibly even unreachable, goal is achieved. Talk about the frequent use of unhealthy coping skills used when feeling unworthy Allow patients to discuss their shame out loud in order to reduce its power over them  Summary of Patient Progress: During group, patient expressed "I'll be worthy when NOW."   He was given positive strokes by the other group members and CSW for being a person who already knows he is worthy.  When he spoke up during group, he was not at all on topic.  Therapeutic Modalities Processing  Ambrose Mantle, LCSW 01/05/2022, 3:02 PM

## 2022-01-05 NOTE — Progress Notes (Signed)
   01/05/22 2040  Psych Admission Type (Psych Patients Only)  Admission Status Involuntary  Psychosocial Assessment  Patient Complaints None  Eye Contact Fair  Facial Expression Flat  Affect Appropriate to circumstance  Speech Logical/coherent  Interaction Assertive  Motor Activity Restless  Appearance/Hygiene Meticulous  Behavior Characteristics Appropriate to situation;Cooperative  Mood Pleasant  Thought Process  Coherency Disorganized  Content Delusions  Delusions Grandeur  Perception Hallucinations  Hallucination Auditory;Visual  Judgment Impaired  Confusion None  Danger to Self  Current suicidal ideation? Denies

## 2022-01-05 NOTE — Progress Notes (Signed)
Pt is A&OX4, calm, denies suicidal ideations, denies homicidal ideations. Pt experiences auditory and visual hallucinations. Pt verbally agrees to approach staff if these become apparent and before harming self or others. Pt denies experiencing nightmares. Mood and affect are congruent. Pt appetite is good. No complaints of distress, pain and/or discomfort at this time. [name] memory appears to be grossly intact, and Pt hasn't displayed any injurious behaviors. Pt is medication compliant. There's no evidence of suicidal intent. Psychomotor activity was WNL. No s/s of Parkinson, Dystonia, Akathisia and/or Tardive Dyskinesia noted.

## 2022-01-05 NOTE — Progress Notes (Signed)
   01/05/22 0500  Sleep  Number of Hours 8    

## 2022-01-05 NOTE — Progress Notes (Signed)
Adult Psychoeducational Group Note  Date:  01/05/2022 Time:  8:20 PM  Group Topic/Focus:  Wrap-Up Group:   The focus of this group is to help patients review their daily goal of treatment and discuss progress on daily workbooks.  Participation Level:  Active  Participation Quality:  Appropriate and Redirectable  Affect:  Appropriate  Cognitive:  Disorganized  Insight:  Limited   Engagement in Group:  Engaged  Modes of Intervention:  Discussion  Additional Comments:   Pt states that he had a good day but offered no other information about the contents of his day. Pt denies everything and states he didn't speak to his doctors today.   Vevelyn Pat 01/05/2022, 8:20 PM

## 2022-01-06 LAB — GLUCOSE, CAPILLARY: Glucose-Capillary: 94 mg/dL (ref 70–99)

## 2022-01-06 NOTE — Plan of Care (Signed)
  Problem: Clinical Measurements: Goal: Diagnostic test results will improve Outcome: Progressing Goal: Signs and symptoms of infection will decrease Outcome: Progressing   Problem: Activity: Goal: Will verbalize the importance of balancing activity with adequate rest periods Outcome: Progressing

## 2022-01-06 NOTE — [facility] Group Notes (Signed)
Adult Psychoeducational Group Note Date:  01/06/2022 Time:  0900-1045 Group Topic/Focus: PROGRESSIVE RELAXATION. A group where deep breathing is taught and tensing and relaxation muscle groups is used. Imagery is used as well.  Pts are asked to imagine 3 pillars that hold them up when they are not able to hold themselves up and to share that with the group.  Participation Level:  Active  Participation Quality:  Appropriate  Affect:  Appropriate  Cognitive:  Oriented  Insight: Improving  Engagement in Group:  Engaged  Modes of Intervention:  Activity, Discussion, Education, and Support  Additional Comments:  Rates his energy at a 10/10. What holds him up and helps him is his mind, his body and his soul. Pt/s behavior and processing is clearer. He is able to speak and interact without delusional verbiage.  Dione Housekeeper

## 2022-01-06 NOTE — Progress Notes (Signed)
   01/06/22 2030  Psych Admission Type (Psych Patients Only)  Admission Status Involuntary  Psychosocial Assessment  Patient Complaints None  Eye Contact Fair  Facial Expression Flat  Affect Appropriate to circumstance  Speech Logical/coherent  Interaction Assertive  Motor Activity Restless  Appearance/Hygiene Meticulous  Behavior Characteristics Cooperative;Appropriate to situation  Mood Pleasant  Thought Process  Coherency WDL  Content WDL  Delusions None reported or observed  Perception Hallucinations  Hallucination None reported or observed  Judgment Poor  Confusion None  Danger to Self  Current suicidal ideation? Denies

## 2022-01-06 NOTE — Group Note (Signed)
[facility] LCSW Group Therapy Note  Date/Time:  01/06/2022  11:00AM-12:00PM  Type of Therapy and Topic:  Group Therapy:  Music and Mood  Participation Level:  Minimal   Description of Group: In this process group, members listened to a variety of genres of music and identified that different types of music evoke different responses.  Patients were encouraged to identify music that was soothing for them and music that was energizing for them.  Patients discussed how this knowledge can help with wellness and recovery in various ways including managing depression and anxiety as well as encouraging healthy sleep habits.    Therapeutic Goals: Patients will explore the impact of different varieties of music on mood Patients will verbalize the thoughts they have when listening to different types of music Patients will identify music that is soothing to them as well as music that is energizing to them Patients will discuss how to use this knowledge to assist in maintaining wellness and recovery Patients will explore the use of music as a coping skill  Summary of Patient Progress:  At the beginning of group, patient expressed his mood was good and "grateful to still be here."  He did not participate in the discussion after songs and eventually left the room, did not return.      Solution Focused Brief Therapy Activity   Ambrose Mantle, LCSW
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
## 2022-01-06 NOTE — Progress Notes (Signed)
   01/06/22 0800  Psych Admission Type (Psych Patients Only)  Admission Status Involuntary  Psychosocial Assessment  Patient Complaints None  Eye Contact Fair  Facial Expression Flat  Affect Appropriate to circumstance  Speech Logical/coherent  Interaction Assertive  Motor Activity Restless  Appearance/Hygiene Meticulous  Behavior Characteristics Appropriate to situation;Cooperative  Mood Pleasant  Aggressive Behavior  Targets Other (Comment) (No aggressive behavior noted)  Effect No apparent injury  Thought Process  Coherency WDL  Content WDL  Delusions None reported or observed  Perception WDL  Hallucination None reported or observed  Judgment Impaired  Confusion None  Danger to Self  Current suicidal ideation? Denies  Danger to Others  Danger to Others None reported or observed

## 2022-01-06 NOTE — Progress Notes (Signed)
Adult Psychoeducational Group Note  Date:  01/06/2022 Time:  8:42 PM  Group Topic/Focus:  Wrap-Up Group:   The focus of this group is to help patients review their daily goal of treatment and discuss progress on daily workbooks.  Participation Level:  Active  Participation Quality:  Appropriate  Affect:  Appropriate  Cognitive:  Appropriate  Insight: Appropriate  Engagement in Group:  Engaged  Modes of Intervention:  Discussion  Additional Comments:   Pt was engaged during group discussion. Pt states that he had a good day but wasn't able to do much throughput the day. Pt states that he's been sleeping majority of the day. Pt denies everything and states that he didn't speak with his care team today.   Vevelyn Pat 01/06/2022, 8:42 PM

## 2022-01-07 ENCOUNTER — Encounter (HOSPITAL_COMMUNITY): Payer: Self-pay

## 2022-01-07 LAB — GLUCOSE, CAPILLARY: Glucose-Capillary: 108 mg/dL — ABNORMAL HIGH (ref 70–99)

## 2022-01-07 NOTE — Plan of Care (Signed)
  Problem: Clinical Measurements: Goal: Diagnostic test results will improve Outcome: Progressing Goal: Signs and symptoms of infection will decrease Outcome: Progressing   Problem: Respiratory: Goal: Ability to maintain adequate ventilation will improve Outcome: Progressing   Problem: Activity: Goal: Will verbalize the importance of balancing activity with adequate rest periods Outcome: Progressing

## 2022-01-07 NOTE — Progress Notes (Signed)
Adult Psychoeducational Group Note  Date:  01/07/2022 Time:  8:36 PM  Group Topic/Focus:  Wrap-Up Group:   The focus of this group is to help patients review their daily goal of treatment and discuss progress on daily workbooks.  Participation Level:  Did Not Attend  Participation Quality:  Did Not Attend  Affect:  Did Not Attend  Cognitive:  Did Not Attend  Insight: Did Not Attend  Engagement in Group:  Did Not Attend  Modes of Intervention:  Did Not Attend  Additional Comments:   Pt was encouraged to attend group discussion but refused   Vevelyn Pat 01/07/2022, 8:36 PM
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
## 2022-01-07 NOTE — Progress Notes (Signed)
   01/07/22 0800  Psych Admission Type (Psych Patients Only)  Admission Status Involuntary  Psychosocial Assessment  Patient Complaints None  Eye Contact Fair  Facial Expression Flat  Affect Appropriate to circumstance  Speech Logical/coherent  Interaction Assertive  Motor Activity Restless  Appearance/Hygiene Unremarkable  Behavior Characteristics Cooperative;Appropriate to situation  Mood Pleasant  Thought Process  Coherency WDL  Content WDL  Delusions None reported or observed  Perception Hallucinations  Hallucination None reported or observed  Judgment Poor  Confusion None  Danger to Self  Current suicidal ideation? Denies  Danger to Others  Danger to Others None reported or observed

## 2022-01-07 NOTE — [facility] Group Notes (Signed)
Adult Psychoeducational Group Note  Date:  01/07/2022 Time:  9:09 AM  Group Topic/Focus:  Goals Group:   The focus of this group is to help patients establish daily goals to achieve during treatment and discuss how the patient can incorporate goal setting into their daily lives to aide in recovery.  Participation Level:  Did Not Attend   [name] 01/07/2022, 9:09 AM

## 2022-01-07 NOTE — Progress Notes (Signed)
[facility] MD Progress Note   01/07/22 08:21 [name]  [id number]    Subjective:     [name] is a 46 year old male with a reported past psychiatric history of schizophrenia paranoid, bipolar disorder housing insecurity who presented to [facility] ED on 8/3 under IVC for psychosis and bizarre behaviors.    Yesterday the psychiatry team made the following recommendations: -Continue Haldol to 10mg  every 12h for psychosis.  -Continue Clozapine 150 mg qhs -Continue Depakote DR 750 mg bid  - New VPA level to be obtained on 01/09/2022. -Continue restoril to 15 mg qhs prn - Patient currently on Abilify Maintena 400 mg LAI, last given 8/16   On assessment today, [name] was seen laying in bed. He still endorses some leg stiffness in the AM, but denies SI, HI, AVH, and all other side effects of medication. He states he has been sleeping "a lot" but otherwise has no changes. He mentions he spoke to his father, who said he had one month on his lease before being evicted to give him time to find another place to live. He is focused on discharge, and has been talking to his mother and father about his living situation. He requests that whatever his plans are, he not be transported by police as he "does not trust them." He remains tangential and rambling, but he does not endorse bizarre delusions and is redirectable.   Review of symptoms Daily review of symptoms, specific for clozapine: Malaise/Sedation: Denies Chest pain: Denies Shortness of breath: Denies Exertional capacity: Denies Tachycardia: Denies Cough: Denies Sore Throat: Denies Fever: Denies Orthostatic hypotension (dizziness with standing): Denies Hypersalivation: Denies Constipation: Reports last bowel movement was yesterday Symptoms of GERD: Denies Nausea: Denies Nocturnal enuresis: Denies, none per nursing    Principal Problem: Schizoaffective disorder, bipolar type ([place]) Diagnosis: Principal Problem:   Schizoaffective disorder,  bipolar type ([place]) Active Problems:   Tobacco use disorder   Diabetes ([place])   Hyperlipidemia   Total Time spent with patient: 20 minutes   Past Psychiatric History:  Previous Psych Diagnoses: Schizophrenia, Schizoaffective disorder-bipolar type Prior inpatient treatment: Multiple including state hospital, [facility] 04/2018 Prior outpatient treatment: [name] History of suicide: History of homicide: Denies Psychiatric medication history: Previously on 6/14 Zyprexa 5 mg qAM and 30 mg qHS; Prolixin 10 mg in 05/2020, Invega Sustenna, PO Invega, and Haldol (02/2020), Neurontin.  Currently taking Abilify Maintena 400 mg and Zyprexa 10 mg BID Psychiatric medication compliance history: Reports compliance, and picking up medications appropriately, per pharmacy records Neuromodulation history: Denies Current Psychiatrist:[facility] services via [facility]   Past Medical History:  Diagnosis Date   Bipolar disorder ([place])    Bronchitis    Hyperlipidemia    Paranoid schizophrenia ([place])    Schizophrenia ([place])     Family Medical History: Family History  Problem Relation Age of Onset   Schizophrenia Neg Hx      Family Psychiatric history:  Social History   Socioeconomic History   Marital status: Single    Spouse name: Not on file   Number of children: Not on file   Years of education: 12 years   Highest education level: 12th grade  Occupational History   Occupation: On disability  Tobacco Use   Smoking status: Every Day    Packs/day: 1.00    Types: Cigarettes   Smokeless tobacco: Never  Vaping Use   Vaping Use: Never used  Substance and Sexual Activity   Alcohol use: Not Currently    Comment: weekly  Drug use: Yes    Types: Marijuana    Comment: smokes hemp   Sexual activity: Yes    Birth control/protection: Condom  Other Topics Concern   Not on file  Social History Narrative   Pt lives alone in apartment complex for mentally ill   Social Determinants of Health   Financial  Resource Strain: Not on file  Food Insecurity: Not on file  Transportation Needs: Not on file  Physical Activity: Not on file  Stress: Not on file  Social Connections: Not on file  Intimate Partner Violence: Not on file    Sleep: Fair 8h.   Appetite: Fair  Current Medications:   Current Facility-Administered Medications:    acetaminophen (TYLENOL) tablet 650 mg, 650 mg, Oral, Q6H PRN, Lamar Sprinklesosby, Courtney, MD, 650 mg at 01/06/22 1141   alum & mag hydroxide-simeth (MAALOX/MYLANTA) 200-200-20 MG/5ML suspension 30 mL, 30 mL, Oral, Q4H PRN, Bobbitt, Shalon E, NP, 30 mL at 12/20/21 1135   atorvastatin (LIPITOR) tablet 40 mg, 40 mg, Oral, QHS, Bobbitt, Shalon E, NP, 40 mg at 01/06/22 2030   benztropine (COGENTIN) tablet 0.5 mg, 0.5 mg, Oral, BID PRN, Mason JimSingleton, Amy E, MD   cloZAPine (CLOZARIL) tablet 150 mg, 150 mg, Oral, QHS, Cosby, Toni Amendourtney, MD, 150 mg at 01/06/22 2028   divalproex (DEPAKOTE ER) 24 hr tablet 750 mg, 750 mg, Oral, BID, Ntuen, Tina C, FNP, 750 mg at 01/06/22 2029   empagliflozin (JARDIANCE) tablet 10 mg, 10 mg, Oral, Daily, Lamar Sprinklesosby, Courtney, MD, 10 mg at 01/06/22 0836   haloperidol (HALDOL) tablet 10 mg, 10 mg, Oral, Q12H, Bryssa Tones, Harrold DonathNathan, MD, 10 mg at 01/06/22 2028   haloperidol (HALDOL) tablet 5 mg, 5 mg, Oral, Q8H PRN **AND** [DISCONTINUED] benztropine (COGENTIN) tablet 1 mg, 1 mg, Oral, Q8H PRN **AND** LORazepam (ATIVAN) tablet 1 mg, 1 mg, Oral, Q8H PRN, Jivan Symanski, MD   haloperidol lactate (HALDOL) injection 5 mg, 5 mg, Intramuscular, Q8H PRN **AND** [DISCONTINUED] benztropine mesylate (COGENTIN) injection 1 mg, 1 mg, Intramuscular, Q8H PRN **AND** LORazepam (ATIVAN) injection 1 mg, 1 mg, Intramuscular, Q8H PRN, Elea Holtzclaw, MD   hydrOXYzine (ATARAX) tablet 25 mg, 25 mg, Oral, TID PRN, Bobbitt, Shalon E, NP, 25 mg at 01/02/22 2029   ibuprofen (ADVIL) tablet 800 mg, 800 mg, Oral, BID PRN, Mason JimSingleton, Amy E, MD, 800 mg at 01/02/22 0803   magnesium hydroxide (MILK  OF MAGNESIA) suspension 30 mL, 30 mL, Oral, Daily PRN, Bobbitt, Shalon E, NP, 30 mL at 12/10/21 2029   melatonin tablet 5 mg, 5 mg, Oral, QHS PRN, Nastassia Bazaldua, Harrold DonathNathan, MD, 5 mg at 01/06/22 2027   metFORMIN (GLUCOPHAGE) tablet 1,000 mg, 1,000 mg, Oral, BID WC, Bobbitt, Shalon E, NP, 1,000 mg at 01/06/22 1700   Muscle Rub CREA, , Topical, PRN, Lamar Sprinklesosby, Courtney, MD, 1 Application at 12/29/21 1125   nicotine (NICODERM CQ - dosed in mg/24 hours) patch 21 mg, 21 mg, Transdermal, Daily, Cosby, Courtney, MD, 21 mg at 01/06/22 0837   pantoprazole (PROTONIX) EC tablet 40 mg, 40 mg, Oral, Daily, Nwoko, Agnes I, NP, 40 mg at 01/06/22 0836   polyethylene glycol (MIRALAX / GLYCOLAX) packet 17 g, 17 g, Oral, Daily PRN, Mason JimSingleton, Amy E, MD   senna-docusate (Senokot-S) tablet 1 tablet, 1 tablet, Oral, Daily, Cosby, Toni Amendourtney, MD, 1 tablet at 01/06/22 0836   sodium chloride (OCEAN) 0.65 % nasal spray 1 spray, 1 spray, Each Nare, PRN, Lamar Sprinklesosby, Courtney, MD, 1 spray at 12/25/21 0834   temazepam (RESTORIL) capsule 15 mg, 15 mg, Oral,  QHS PRN, Phineas Inches, MD, 15 mg at 01/06/22 2027   traZODone (DESYREL) tablet 100 mg, 100 mg, Oral, QHS PRN, Mason Jim, Amy E, MD, 100 mg at 01/06/22 2027    Lab results: Results for orders placed or performed during the hospital encounter of 12/07/21 (from the past 48 hour(s))  Glucose, capillary     Status: None   Collection Time: 01/06/22  5:56 AM  Result Value Ref Range   Glucose-Capillary 94 70 - 99 mg/dL    Comment: Glucose reference range applies only to samples taken after fasting for at least 8 hours.  Glucose, capillary     Status: Abnormal   Collection Time: 01/07/22  6:09 AM  Result Value Ref Range   Glucose-Capillary 108 (H) 70 - 99 mg/dL    Comment: Glucose reference range applies only to samples taken after fasting for at least 8 hours.   . Physical Exam: Psychiatric Specialty Exam: Physical Exam Neurological:     Mental Status: He is alert and oriented to  person, place, and time.  Psychiatric:        Attention and Perception: Attention and perception normal.        Mood and Affect: Mood and affect normal.        Speech: Speech is tangential.        Behavior: Behavior normal. Behavior is cooperative.        Thought Content: Thought content is delusional.        Cognition and Memory: Cognition and memory normal.        Judgment: Judgment normal.     Review of Systems  Constitutional:  Negative for fatigue and fever.  HENT:  Negative for drooling.   Respiratory:  Negative for cough and shortness of breath.   Cardiovascular:  Negative for chest pain and palpitations.  Gastrointestinal:  Negative for constipation, nausea and vomiting.  Genitourinary:  Negative for enuresis.  Musculoskeletal:  Negative for myalgias.  Neurological:  Negative for dizziness.  Psychiatric/Behavioral:  Negative for agitation, hallucinations, sleep disturbance and suicidal ideas. The patient is not nervous/anxious.     Blood pressure (!) 126/93, pulse 91, temperature (!) 97.3 F (36.3 C), temperature source Oral, resp. rate 16, height 6\' 1"  (1.854 m), weight 117.9 kg, SpO2 100 %.Body mass index is 34.3 kg/m.  General Appearance: Fairly Groomed  Eye Contact:  Fair  Speech:  Clear and Coherent  Volume:  Normal  Mood:  Euthymic "good"  Affect:  Congruent  Thought Process:  Goal Directed  Orientation:  Full (Time, Place, and Person)  Thought Content:  Delusions and Tangential  Suicidal Thoughts:  No  Homicidal Thoughts:  No  Memory:  Full memory intact.   Judgement:  Poor  Insight:  Lacking  Psychomotor Activity:  Normal  Concentration:  Concentration: Good  Recall:  Good  Fund of Knowledge:  Good  Language:  Good  Akathisia:  No  Handed:  Right  AIMS (if indicated):  0   Assets:   Resilience  ADL's:  Intact  Cognition:  Intact  Sleep:  Number of Hours: 8    ASSESSMENT: Principal Problem:   Schizoaffective disorder, bipolar type (HCC) Active  Problems: Tobacco use disorder Type 2 DM Hyperlipidemia   PLAN: Safety and Monitoring:             -- Involuntary admission to inpatient psychiatric unit for safety, stabilization and treatment             -- Daily contact with patient to  assess and evaluate symptoms and progress in treatment             -- Patient's case to be discussed in multi-disciplinary team meeting             -- Observation Level : q15 minute checks             -- Vital signs:  q12 hours             -- Precautions: suicide, elopement, and assault   2. Psychiatric Diagnoses and Treatment:  # Schizoaffective disorder - bipolar type -- Continue Haldol 10 mg every 12 hours for psychosis  -- Patient currently on Abilify Maintena 400 mg LAI, last given 8/16 -- Continue clozapine 150 mg qhs for psychosis -- Continue Restoril 15mg  prn at bed time for sleep   --Continue Depakote DR 500 mg bid - for mood stabilization  Continue Depakote ER 750 mg p.o. twice daily.             VPA level to be obtained on 01/09/2022. Will also get ammonia level at this time due to pt's history of high ammonia on higher doses of depakote. Patient not currently manic or agitated however level was 40 at last check.   -- Metabolic profile and EKG monitoring obtained while on antipsychotics  BMI: 34.3 Lipid Panel: WNL other than TG 172 HbgA1c: 6.9% QTc: 426ms on 12/30/21 -- Encouraged patient to participate in unit milieu and in scheduled group therapies  -- CK 173 on 8/31- currently afebrile and VSS; continue to monitor with Clozaril titration  -- Scheduled Senna 8.6 daily with dose titration up on Clozaril and monitoring bowel function --Troponin I was 3 on 01/01/22, Continue to monitor as we titrate up on Clozaril. Order immediately if chest pain.     3. Medical Issues Being Addressed: #Tobacco use disorder - Continue Nicotine patch 21 mg daily   #T2DM -- Due to the fact that patient is actively psychotic, only completing a.m. FSBS; will  resume.  Patient on home regimen and diabetes well-controlled (A1c 6.9%), so minimal concern for decompensation. - Continue Metformin 1000 mg twice daily with meals - Continue Jardiance 10 mg every morning - A.m. CBG monitoring only   #Hyperlipidemia - Continue home atorvastatin 40 mg daily    #Acid reflux.  - Continue Protonix 40 mg po Q am    4. Discharge Planning:  -- Social work and case management to assist with discharge planning and identification of hospital follow-up needs prior to discharge -- Discharge Concerns: Need to establish a safety plan; Medication compliance and effectiveness -- Discharge Goals: Return home with outpatient referrals for mental health follow-up including medication management/psychotherapy  Vonna Drafts OMS-IV   Total Time Spent in Direct Patient Care:  I personally spent 30 minutes on the unit in direct patient care. The direct patient care time included face-to-face time with the patient, reviewing the patient's chart, communicating with other professionals, and coordinating care. Greater than 50% of this time was spent in counseling or coordinating care with the patient regarding goals of hospitalization, psycho-education, and discharge planning needs.  I personally was present and performed or re-performed the history, physical exam and medical decision-making activities of this service and have verified that the service and findings are accurately documented in the student's note, , as addended by me or notated below:  Pt doing much better since my last eval 3 days ago. Less talkative, less delusional (overtly), more logical and linear thinking. We will  get depakote labs on wednesday and look to dc on Wednesday vs Thursday.  SW to contact mother (LG) about if he can return to apartment or not.   Phineas Inches, MD Psychiatrist

## 2022-01-07 NOTE — BH IP Treatment Plan (Signed)
Interdisciplinary Treatment and Diagnostic Plan Update  01/07/2022 Time of Session: 0830 JAWANN URBANI MRN: [id number]  Principal Diagnosis: Schizoaffective disorder, bipolar type [facility])  Secondary Diagnoses: Principal Problem:   Schizoaffective disorder, bipolar type (HCC) Active Problems:   Tobacco use disorder   Diabetes (HCC)   Hyperlipidemia   Current Medications:  Current Facility-Administered Medications  Medication Dose Route Frequency Provider Last Rate Last Admin   acetaminophen (TYLENOL) tablet 650 mg  650 mg Oral Q6H PRN Lamar Sprinkles, MD   650 mg at 01/06/22 1141   alum & mag hydroxide-simeth (MAALOX/MYLANTA) 200-200-20 MG/5ML suspension 30 mL  30 mL Oral Q4H PRN Bobbitt, Shalon E, NP   30 mL at 12/20/21 1135   atorvastatin (LIPITOR) tablet 40 mg  40 mg Oral QHS Bobbitt, Shalon E, NP   40 mg at 01/06/22 2030   benztropine (COGENTIN) tablet 0.5 mg  0.5 mg Oral BID PRN Comer Locket, MD       cloZAPine (CLOZARIL) tablet 150 mg  150 mg Oral QHS Lamar Sprinkles, MD   150 mg at 01/06/22 2028   divalproex (DEPAKOTE ER) 24 hr tablet 750 mg  750 mg Oral BID Alan Mulder C, FNP   750 mg at 01/07/22 3267   empagliflozin (JARDIANCE) tablet 10 mg  10 mg Oral Daily Lamar Sprinkles, MD   10 mg at 01/07/22 1245   haloperidol (HALDOL) tablet 10 mg  10 mg Oral Q12H Massengill, Harrold Donath, MD   10 mg at 01/07/22 8099   haloperidol (HALDOL) tablet 5 mg  5 mg Oral Q8H PRN Massengill, Harrold Donath, MD       And   LORazepam (ATIVAN) tablet 1 mg  1 mg Oral Q8H PRN Massengill, Harrold Donath, MD       haloperidol lactate (HALDOL) injection 5 mg  5 mg Intramuscular Q8H PRN Massengill, Nathan, MD       And   LORazepam (ATIVAN) injection 1 mg  1 mg Intramuscular Q8H PRN Massengill, Harrold Donath, MD       hydrOXYzine (ATARAX) tablet 25 mg  25 mg Oral TID PRN Bobbitt, Shalon E, NP   25 mg at 01/02/22 2029   ibuprofen (ADVIL) tablet 800 mg  800 mg Oral BID PRN Comer Locket, MD   800 mg at 01/02/22 0803   magnesium  hydroxide (MILK OF MAGNESIA) suspension 30 mL  30 mL Oral Daily PRN Bobbitt, Shalon E, NP   30 mL at 12/10/21 2029   melatonin tablet 5 mg  5 mg Oral QHS PRN Massengill, Harrold Donath, MD   5 mg at 01/06/22 2027   metFORMIN (GLUCOPHAGE) tablet 1,000 mg  1,000 mg Oral BID WC Bobbitt, Shalon E, NP   1,000 mg at 01/07/22 8338   Muscle Rub CREA   Topical PRN Lamar Sprinkles, MD   1 Application at 12/29/21 1125   nicotine (NICODERM CQ - dosed in mg/24 hours) patch 21 mg  21 mg Transdermal Daily Lamar Sprinkles, MD   21 mg at 01/06/22 0837   pantoprazole (PROTONIX) EC tablet 40 mg  40 mg Oral Daily Nwoko, Nicole Kindred I, NP   40 mg at 01/07/22 0823   polyethylene glycol (MIRALAX / GLYCOLAX) packet 17 g  17 g Oral Daily PRN Comer Locket, MD       senna-docusate (Senokot-S) tablet 1 tablet  1 tablet Oral Daily Lamar Sprinkles, MD   1 tablet at 01/07/22 0823   sodium chloride (OCEAN) 0.65 % nasal spray 1 spray  1 spray Each Nare PRN  Lamar Sprinkles, MD   1 spray at 12/25/21 0834   temazepam (RESTORIL) capsule 15 mg  15 mg Oral QHS PRN Phineas Inches, MD   15 mg at 01/06/22 2027   traZODone (DESYREL) tablet 100 mg  100 mg Oral QHS PRN Comer Locket, MD   100 mg at 01/06/22 2027   PTA Medications: Medications Prior to Admission  Medication Sig Dispense Refill Last Dose   ABILIFY MAINTENA 400 MG PRSY prefilled syringe Inject 400 mg into the muscle every 28 (twenty-eight) days.      atorvastatin (LIPITOR) 40 MG tablet Take 40 mg by mouth at bedtime.      ibuprofen (ADVIL) 800 MG tablet Take 800 mg by mouth 2 (two) times daily as needed for fever, mild pain or headache.      metFORMIN (GLUCOPHAGE) 500 MG tablet Take 2 tablets (1,000 mg total) by mouth 2 (two) times daily with a meal. 60 tablet 0    naproxen (NAPROSYN) 500 MG tablet Take 1 tablet (500 mg total) by mouth 2 (two) times daily. 10 tablet 0     Patient Stressors: Medication change or noncompliance    Patient Strengths: Capable of independent living   Motivation for treatment/growth  Supportive family/friends   Treatment Modalities: Medication Management, Group therapy, Case management,  1 to 1 session with clinician, Psychoeducation, Recreational therapy.   Physician Treatment Plan for Primary Diagnosis: Schizoaffective disorder, bipolar type (HCC) Long Term Goal(s): Improvement in symptoms so as ready for discharge   Short Term Goals: Ability to identify changes in lifestyle to reduce recurrence of condition will improve Ability to verbalize feelings will improve Ability to demonstrate self-control will improve Ability to identify and develop effective coping behaviors will improve Compliance with prescribed medications will improve  Medication Management: Evaluate patient's response, side effects, and tolerance of medication regimen.  Therapeutic Interventions: 1 to 1 sessions, Unit Group sessions and Medication administration.  Evaluation of Outcomes: Progressing  Physician Treatment Plan for Secondary Diagnosis: Principal Problem:   Schizoaffective disorder, bipolar type (HCC) Active Problems:   Tobacco use disorder   Diabetes (HCC)   Hyperlipidemia  Long Term Goal(s): Improvement in symptoms so as ready for discharge   Short Term Goals: Ability to identify changes in lifestyle to reduce recurrence of condition will improve Ability to verbalize feelings will improve Ability to demonstrate self-control will improve Ability to identify and develop effective coping behaviors will improve Compliance with prescribed medications will improve     Medication Management: Evaluate patient's response, side effects, and tolerance of medication regimen.  Therapeutic Interventions: 1 to 1 sessions, Unit Group sessions and Medication administration.  Evaluation of Outcomes: Progressing   RN Treatment Plan for Primary Diagnosis: Schizoaffective disorder, bipolar type (HCC) Long Term Goal(s): Knowledge of disease and therapeutic  regimen to maintain health will improve  Short Term Goals: Ability to remain free from injury will improve, Ability to verbalize frustration and anger appropriately will improve, Ability to demonstrate self-control, Ability to participate in decision making will improve, Ability to verbalize feelings will improve, Ability to disclose and discuss suicidal ideas, Ability to identify and develop effective coping behaviors will improve, and Compliance with prescribed medications will improve  Medication Management: RN will administer medications as ordered by provider, will assess and evaluate patient's response and provide education to patient for prescribed medication. RN will report any adverse and/or side effects to prescribing provider.  Therapeutic Interventions: 1 on 1 counseling sessions, Psychoeducation, Medication administration, Evaluate responses to treatment, Monitor vital  signs and CBGs as ordered, Perform/monitor CIWA, COWS, AIMS and Fall Risk screenings as ordered, Perform wound care treatments as ordered.  Evaluation of Outcomes: Progressing   LCSW Treatment Plan for Primary Diagnosis: Schizoaffective disorder, bipolar type (HCC) Long Term Goal(s): Safe transition to appropriate next level of care at discharge, Engage patient in therapeutic group addressing interpersonal concerns.  Short Term Goals: Engage patient in aftercare planning with referrals and resources, Increase social support, Increase ability to appropriately verbalize feelings, Increase emotional regulation, Facilitate acceptance of mental health diagnosis and concerns, Facilitate patient progression through stages of change regarding substance use diagnoses and concerns, Identify triggers associated with mental health/substance abuse issues, and Increase skills for wellness and recovery  Therapeutic Interventions: Assess for all discharge needs, 1 to 1 time with Social worker, Explore available resources and support  systems, Assess for adequacy in community support network, Educate family and significant other(s) on suicide prevention, Complete Psychosocial Assessment, Interpersonal group therapy.  Evaluation of Outcomes: Progressing   Progress in Treatment: Attending groups: Yes. Participating in groups: Yes. Taking medication as prescribed: Yes. Toleration medication: Yes. Family/Significant other contact made: Yes, individual(s) contacted:  Othman Masur, father,  [phone]  Patient understands diagnosis: Yes. Discussing patient identified problems/goals with staff: Yes. Medical problems stabilized or resolved: Yes. Denies suicidal/homicidal ideation: No. Issues/concerns per patient self-inventory: Yes. Other: none  New problem(s) identified: No, Describe:  none  New Short Term/Long Term Goal(s): Patient to work towards elimination of symptoms of psychosis, medication management for mood stabilization;  development of comprehensive mental wellness plan.  Patient Goals:  No additional goals identified at this time. Patient to continue to work towards original goals identified in initial treatment team meeting. CSW will remain available to patient should they voice additional treatment goals.   Discharge Plan or Barriers: No psychosocial barriers identified at this time, patient to return to place of residence when appropriate for discharge.   Reason for Continuation of Hospitalization: Other; describe psychosis   Estimated Length of Stay: 1-7 days   Scribe for Treatment Team: Almedia Balls 01/07/2022 11:38 AM

## 2022-01-07 NOTE — Group Note (Signed)
LCSW Group Therapy Note  Group Date: 01/07/2022 Start Time: 1300 End Time: 1400   Type of Therapy and Topic:  Group Therapy - Healthy vs Unhealthy Coping Skills  Participation Level:  Minimal   Description of Group The focus of this group was to determine what unhealthy coping techniques typically are used by group members and what healthy coping techniques would be helpful in coping with various problems. Patients were guided in becoming aware of the differences between healthy and unhealthy coping techniques. Patients were asked to identify 2-3 healthy coping skills they would like to learn to use more effectively.  Therapeutic Goals Patients learned that coping is what human beings do all day long to deal with various situations in their lives Patients defined and discussed healthy vs unhealthy coping techniques Patients identified their preferred coping techniques and identified whether these were healthy or unhealthy Patients determined 2-3 healthy coping skills they would like to become more familiar with and use more often. Patients provided support and ideas to each other   Summary of Patient Progress:   Patient was present for the entirety of group session. Patient participated in opening and closing remarks. However, patient did not contribute at all to the topic of discussion despite encouraged participation.   Therapeutic Modalities Cognitive Behavioral Therapy Motivational Interviewing  Almedia Balls 01/07/2022  2:49 PM

## 2022-01-07 NOTE — Plan of Care (Signed)
Called Mom [facility] [place]): She states Fitzgerald is sounding much better. He has a 30-day lease for his apartment to find another place to live. She believes he is safe for discharge within a few days, and can pick him up if given about a days notice. She requests any resources available that can help her find a place for him. We talked about ACT team and if PHP is a Insurance underwriter. She reports he has some non-compliance and asked [place] to do what we could to lessen polypharmacy. She wants [place] to call the day before discharge so she can make sure she is available.

## 2022-01-08 ENCOUNTER — Ambulatory Visit (HOSPITAL_COMMUNITY)
Admit: 2022-01-08 | Discharge: 2022-01-08 | Disposition: A | Discharge status: Home / Self Care | Payer: Medicare Other | Attending: Psychiatry | Admitting: Psychiatry

## 2022-01-08 LAB — CBC WITH DIFFERENTIAL/PLATELET
Abs Immature Granulocytes: 0.04 10*3/uL (ref 0.00–0.07)
Basophils Absolute: 0.1 10*3/uL (ref 0.0–0.1)
Basophils Relative: 1 %
Eosinophils Absolute: 0.2 10*3/uL (ref 0.0–0.5)
Eosinophils Relative: 3 %
HCT: 41.2 % (ref 39.0–52.0)
Hemoglobin: 13.7 g/dL (ref 13.0–17.0)
Immature Granulocytes: 1 %
Lymphocytes Relative: 30 %
Lymphs Abs: 2.6 10*3/uL (ref 0.7–4.0)
MCH: 29.6 pg (ref 26.0–34.0)
MCHC: 33.3 g/dL (ref 30.0–36.0)
MCV: 89 fL (ref 80.0–100.0)
Monocytes Absolute: 0.6 10*3/uL (ref 0.1–1.0)
Monocytes Relative: 6 %
Neutro Abs: 5.3 10*3/uL (ref 1.7–7.7)
Neutrophils Relative %: 59 %
Platelets: 329 10*3/uL (ref 150–400)
RBC: 4.63 MIL/uL (ref 4.22–5.81)
RDW: 12.3 % (ref 11.5–15.5)
WBC: 8.8 10*3/uL (ref 4.0–10.5)
nRBC: 0 % (ref 0.0–0.2)

## 2022-01-08 LAB — GLUCOSE, CAPILLARY: Glucose-Capillary: 109 mg/dL — ABNORMAL HIGH (ref 70–99)

## 2022-01-08 NOTE — Progress Notes (Signed)
   01/08/22 2045  Psych Admission Type (Psych Patients Only)  Admission Status Involuntary  Psychosocial Assessment  Patient Complaints None  Eye Contact Fair  Facial Expression Flat  Affect Appropriate to circumstance  Speech Logical/coherent  Interaction Assertive  Motor Activity Restless  Appearance/Hygiene Unremarkable  Behavior Characteristics Cooperative  Mood Suspicious;Preoccupied;Pleasant  Aggressive Behavior  Effect No apparent injury  Thought Process  Coherency WDL  Content WDL  Delusions None reported or observed  Perception Hallucinations  Hallucination Auditory  Judgment Poor  Confusion WDL  Danger to Self  Current suicidal ideation? Denies  Danger to Others  Danger to Others None reported or observed

## 2022-01-08 NOTE — Progress Notes (Signed)
   01/08/22 0800  Psych Admission Type (Psych Patients Only)  Admission Status Involuntary  Psychosocial Assessment  Patient Complaints None  Eye Contact Fair  Facial Expression Flat  Affect Appropriate to circumstance  Speech Logical/coherent  Interaction Assertive  Motor Activity Restless  Appearance/Hygiene Unremarkable  Behavior Characteristics Cooperative;Appropriate to situation  Mood Labile;Preoccupied  Aggressive Behavior  Effect No apparent injury  Thought Process  Coherency WDL  Content WDL  Delusions None reported or observed  Perception WDL  Hallucination None reported or observed  Judgment Impaired  Confusion WDL  Danger to Self  Current suicidal ideation? Denies  Danger to Others  Danger to Others None reported or observed

## 2022-01-08 NOTE — Plan of Care (Signed)
  Problem: Clinical Measurements: Goal: Diagnostic test results will improve Outcome: Progressing Goal: Signs and symptoms of infection will decrease Outcome: Progressing   Problem: Respiratory: Goal: Ability to maintain adequate ventilation will improve Outcome: Progressing   

## 2022-01-08 NOTE — Progress Notes (Signed)
Adult Psychoeducational Group Note  Date:  01/08/2022 Time:  8:28 PM  Group Topic/Focus:  Wrap-Up Group:   The focus of this group is to help patients review their daily goal of treatment and discuss progress on daily workbooks.  Participation Level:  Did Not Attend  Participation Quality:   Did Not Attend  Affect:   Did Not Attend  Cognitive:   Did Not Attend   Insight: None  Engagement in Group:   Did Not Attend  Modes of Intervention:   Did Not Attend  Additional Comments:  Pt was encouraged to attend wrap up group but did not attend.  Felipa Furnace 01/08/2022, 8:28 PM

## 2022-01-08 NOTE — Progress Notes (Signed)
   01/08/22 0500  Sleep  Number of Hours 4.25

## 2022-01-08 NOTE — Progress Notes (Addendum)
[facility] MD Progress Note   01/08/22 08:14 KRYSTLE MAHESHWARI [id number]    Subjective:   [name] is a 46 year old male with a reported past psychiatric history of schizophrenia paranoid, bipolar disorder housing insecurity who presented to [facility] ED on 8/3 under IVC for psychosis and bizarre behaviors.    Yesterday the psychiatry team made the following recommendations: -Continue Haldol to 10mg  every 12h for psychosis.  -Continue Clozapine 150 mg qhs -Continue Depakote DR 750 mg bid  - New VPA level to be obtained on 01/09/2022. -Continue restoril to 15 mg qhs prn - Patient currently on Abilify Maintena 400 mg LAI, last given 8/16   On assessment today,  [name] states he is feeling "pretty good". He still has some morning stiffness in his legs (later described as his feet feel heavy when he picks them up, more in the mornings), but denies all other side effects. He does not endorse any delusions or AVH. Denies SI and HI. He is excited to go home tomorrow, and expresses understanding of the plan for discharge and outpatient. He expresses gratitude towards [place] and his parents for helping him.   Called mom 12:10 (LG) - she is agreeable with discharge tomorrow and willing to pick him up at 11am. She is still worried about polypharmacy and compliance concerns.   Review of symptoms Daily review of symptoms, specific for clozapine: Malaise/Sedation: Denies Chest pain: Denies Shortness of breath: Denies Exertional capacity: Denies Tachycardia: Denies Cough: Denies Sore Throat: Denies Fever: Denies Orthostatic hypotension (dizziness with standing): Denies Hypersalivation: Denies Constipation: Reports last bowel movement was yesterday Symptoms of GERD: Denies Nausea: Denies Nocturnal enuresis: Denies, none per nursing    Principal Problem: Schizoaffective disorder, bipolar type ([place]) Diagnosis: Principal Problem:   Schizoaffective disorder, bipolar type ([place]) Active Problems:   Tobacco use  disorder   Diabetes ([place])   Hyperlipidemia   Total Time spent with patient: 20 minutes   Past Psychiatric History:  Previous Psych Diagnoses: Schizophrenia, Schizoaffective disorder-bipolar type Prior inpatient treatment: Multiple including [facility], [facility] 04/2018 Prior outpatient treatment: [name] History of suicide: History of homicide: Denies Psychiatric medication history: Previously on 6/14 Zyprexa 5 mg qAM and 30 mg qHS; Prolixin 10 mg in 05/2020, Invega Sustenna, PO Invega, and Haldol (02/2020), Neurontin.  Currently taking Abilify Maintena 400 mg and Zyprexa 10 mg BID Psychiatric medication compliance history: Reports compliance, and picking up medications appropriately, per pharmacy records Neuromodulation history: Denies Current Psychiatrist:ACTT services via [facility]   Past Medical History:  Diagnosis Date   Bipolar disorder ([place])    Bronchitis    Hyperlipidemia    Paranoid schizophrenia ([place])    Schizophrenia ([place])     Family Medical History: Family History  Problem Relation Age of Onset   Schizophrenia Neg Hx      Family Psychiatric history:  Social History   Socioeconomic History   Marital status: Single    Spouse name: Not on file   Number of children: Not on file   Years of education: 12 years   Highest education level: 12th grade  Occupational History   Occupation: On disability  Tobacco Use   Smoking status: Every Day    Packs/day: 1.00    Types: Cigarettes   Smokeless tobacco: Never  Vaping Use   Vaping Use: Never used  Substance and Sexual Activity   Alcohol use: Not Currently    Comment: weekly   Drug use: Yes    Types: Marijuana    Comment: smokes  hemp   Sexual activity: Yes    Birth control/protection: Condom  Other Topics Concern   Not on file  Social History Narrative   Pt lives alone in apartment complex for mentally ill   Social Determinants of Health   Financial Resource Strain: Not on file  Food Insecurity: Not  on file  Transportation Needs: Not on file  Physical Activity: Not on file  Stress: Not on file  Social Connections: Not on file  Intimate Partner Violence: Not on file    Sleep: Poor 4.5h.   Appetite: Fair  Current Medications:   Current Facility-Administered Medications:    acetaminophen (TYLENOL) tablet 650 mg, 650 mg, Oral, Q6H PRN, Lamar Sprinkles, MD, 650 mg at 01/07/22 1208   alum & mag hydroxide-simeth (MAALOX/MYLANTA) 200-200-20 MG/5ML suspension 30 mL, 30 mL, Oral, Q4H PRN, Bobbitt, Shalon E, NP, 30 mL at 12/20/21 1135   atorvastatin (LIPITOR) tablet 40 mg, 40 mg, Oral, QHS, Bobbitt, Shalon E, NP, 40 mg at 01/07/22 2028   benztropine (COGENTIN) tablet 0.5 mg, 0.5 mg, Oral, BID PRN, Mason Jim, Amy E, MD   cloZAPine (CLOZARIL) tablet 150 mg, 150 mg, Oral, QHS, Cosby, Toni Amend, MD, 150 mg at 01/07/22 2028   divalproex (DEPAKOTE ER) 24 hr tablet 750 mg, 750 mg, Oral, BID, Ntuen, Tina C, FNP, 750 mg at 01/08/22 0800   empagliflozin (JARDIANCE) tablet 10 mg, 10 mg, Oral, Daily, Cosby, Toni Amend, MD, 10 mg at 01/08/22 0800   haloperidol (HALDOL) tablet 10 mg, 10 mg, Oral, Q12H, Ramello Cordial, MD, 10 mg at 01/08/22 0800   haloperidol (HALDOL) tablet 5 mg, 5 mg, Oral, Q8H PRN **AND** [DISCONTINUED] benztropine (COGENTIN) tablet 1 mg, 1 mg, Oral, Q8H PRN **AND** LORazepam (ATIVAN) tablet 1 mg, 1 mg, Oral, Q8H PRN, Elayah Klooster, MD   haloperidol lactate (HALDOL) injection 5 mg, 5 mg, Intramuscular, Q8H PRN **AND** [DISCONTINUED] benztropine mesylate (COGENTIN) injection 1 mg, 1 mg, Intramuscular, Q8H PRN **AND** LORazepam (ATIVAN) injection 1 mg, 1 mg, Intramuscular, Q8H PRN, Selden Noteboom, MD   hydrOXYzine (ATARAX) tablet 25 mg, 25 mg, Oral, TID PRN, Bobbitt, Shalon E, NP, 25 mg at 01/02/22 2029   ibuprofen (ADVIL) tablet 800 mg, 800 mg, Oral, BID PRN, Mason Jim, Amy E, MD, 800 mg at 01/02/22 0803   magnesium hydroxide (MILK OF MAGNESIA) suspension 30 mL, 30 mL, Oral, Daily  PRN, Bobbitt, Shalon E, NP, 30 mL at 12/10/21 2029   melatonin tablet 5 mg, 5 mg, Oral, QHS PRN, Deanette Tullius, Harrold Donath, MD, 5 mg at 01/07/22 2028   metFORMIN (GLUCOPHAGE) tablet 1,000 mg, 1,000 mg, Oral, BID WC, Bobbitt, Shalon E, NP, 1,000 mg at 01/08/22 0800   Muscle Rub CREA, , Topical, PRN, Lamar Sprinkles, MD, 1 Application at 12/29/21 1125   nicotine (NICODERM CQ - dosed in mg/24 hours) patch 21 mg, 21 mg, Transdermal, Daily, Cosby, Courtney, MD, 21 mg at 01/06/22 0837   pantoprazole (PROTONIX) EC tablet 40 mg, 40 mg, Oral, Daily, Nwoko, Agnes I, NP, 40 mg at 01/08/22 0800   polyethylene glycol (MIRALAX / GLYCOLAX) packet 17 g, 17 g, Oral, Daily PRN, Mason Jim, Amy E, MD   senna-docusate (Senokot-S) tablet 1 tablet, 1 tablet, Oral, Daily, Cosby, Courtney, MD, 1 tablet at 01/08/22 0800   sodium chloride (OCEAN) 0.65 % nasal spray 1 spray, 1 spray, Each Nare, PRN, Lamar Sprinkles, MD, 1 spray at 12/25/21 0834   temazepam (RESTORIL) capsule 15 mg, 15 mg, Oral, QHS PRN, Akeelah Seppala, Harrold Donath, MD, 15 mg at 01/07/22 2028   traZODone (  DESYREL) tablet 100 mg, 100 mg, Oral, QHS PRN, Nelda Marseille, Amy E, MD, 100 mg at 01/07/22 2028    Lab results: Results for orders placed or performed during the hospital encounter of 12/07/21 (from the past 48 hour(s))  Glucose, capillary     Status: Abnormal   Collection Time: 01/07/22  6:09 AM  Result Value Ref Range   Glucose-Capillary 108 (H) 70 - 99 mg/dL    Comment: Glucose reference range applies only to samples taken after fasting for at least 8 hours.  Glucose, capillary     Status: Abnormal   Collection Time: 01/08/22  6:02 AM  Result Value Ref Range   Glucose-Capillary 109 (H) 70 - 99 mg/dL    Comment: Glucose reference range applies only to samples taken after fasting for at least 8 hours.  CBC with Differential/Platelet     Status: None   Collection Time: 01/08/22  6:39 AM  Result Value Ref Range   WBC 8.8 4.0 - 10.5 K/uL   RBC 4.63 4.22 - 5.81 MIL/uL    Hemoglobin 13.7 13.0 - 17.0 g/dL   HCT 41.2 39.0 - 52.0 %   MCV 89.0 80.0 - 100.0 fL   MCH 29.6 26.0 - 34.0 pg   MCHC 33.3 30.0 - 36.0 g/dL   RDW 12.3 11.5 - 15.5 %   Platelets 329 150 - 400 K/uL   nRBC 0.0 0.0 - 0.2 %   Neutrophils Relative % 59 %   Neutro Abs 5.3 1.7 - 7.7 K/uL   Lymphocytes Relative 30 %   Lymphs Abs 2.6 0.7 - 4.0 K/uL   Monocytes Relative 6 %   Monocytes Absolute 0.6 0.1 - 1.0 K/uL   Eosinophils Relative 3 %   Eosinophils Absolute 0.2 0.0 - 0.5 K/uL   Basophils Relative 1 %   Basophils Absolute 0.1 0.0 - 0.1 K/uL   Immature Granulocytes 1 %   Abs Immature Granulocytes 0.04 0.00 - 0.07 K/uL    Comment: Performed at [facility], [place] [address] [place], [place] [address]   . Physical Exam: Psychiatric Specialty Exam: Physical Exam Neurological:     Mental Status: He is alert and oriented to person, place, and time.  Psychiatric:        Attention and Perception: Attention and perception normal.        Mood and Affect: Mood and affect normal.        Speech: Speech normal.        Behavior: Behavior normal. Behavior is cooperative.        Thought Content: Thought content normal.        Cognition and Memory: Cognition and memory normal.        Judgment: Judgment normal.     Review of Systems  Constitutional:  Negative for chills, fatigue and fever.  HENT:  Negative for sore throat.   Respiratory:  Negative for cough.   Cardiovascular:  Negative for chest pain.  Gastrointestinal:  Negative for constipation, nausea and vomiting.  Neurological:  Negative for dizziness, facial asymmetry and headaches.  Psychiatric/Behavioral:  Negative for dysphoric mood, hallucinations, self-injury, sleep disturbance and suicidal ideas. The patient is not nervous/anxious.     Blood pressure 103/75, pulse (!) 102, temperature 98.3 F (36.8 C), temperature source Oral, resp. rate 18, height 6\' 1"  (1.854 m), weight 117.9 kg, SpO2 98 %.Body mass index is 34.3  kg/m.  General Appearance: Fairly Groomed  Eye Contact:  Good  Speech:  Clear and Coherent  Volume:  Normal  Mood:  Euthymic "good"  Affect:  Congruent  Thought Process:  Linear  Orientation:  Full (Time, Place, and Person)  Thought Content:  Logical  Suicidal Thoughts:  No  Homicidal Thoughts:  No  Memory:  Full memory intact.   Judgement:  Fair  Insight:  Lacking  Psychomotor Activity:  Normal  Concentration:  Concentration: Good  Recall:  Good  Fund of Knowledge:  Good  Language:  Good  Akathisia:  No  Handed:  Right  AIMS (if indicated):  0   Assets:   Desire for Improvement Resilience Social Support  ADL's:  Intact  Cognition:  Intact  Sleep:  Number of Hours: 4.25    ASSESSMENT: Principal Problem:   Schizoaffective disorder, bipolar type (H[facility]) Active Problems: Tobacco use disorder Type 2 DM Hyperlipidemia   PLAN: Safety and Monitoring:             -- Involuntary admission to inpatient psychiatric unit for safety, stabilization and treatment             -- Daily contact with patient to assess and evaluate symptoms and progress in treatment             -- Patient's case to be discussed in multi-disciplinary team meeting             -- Observation Level : q15 minute checks             -- Vital signs:  q12 hours             -- Precautions: suicide, elopement, and assault   2. Psychiatric Diagnoses and Treatment:  # Schizoaffective disorder - bipolar type -- Continue Haldol 10 mg every 12 hours for psychosis  -- Patient currently on Abilify Maintena 400 mg LAI, last given 8/16 - will not continue this  -- Continue clozapine 150 mg qhs for psychosis -- Continue Restoril 15mg  prn at bed time for sleep   --Continue Depakote DR 500 mg bid - for mood stabilization  -Continue Depakote ER 750 mg p.o. twice daily.             -VPA level to be obtained on 01/09/2022. Will also get ammonia level at this time due to pt's history of high ammonia on higher doses of  depakote. Patient not currently manic or agitated however level was 40 at last check.   -- Metabolic profile and EKG monitoring obtained while on antipsychotics  BMI: 34.3 Lipid Panel: WNL other than TG 172 HbgA1c: 6.9% QTc: 01/11/2022 on 12/30/21 -- Encouraged patient to participate in unit milieu and in scheduled group therapies  -- CK 173 on 8/31- currently afebrile and VSS; continue to monitor with Clozaril titration  -- Scheduled Senna 8.6 daily with dose titration up on Clozaril and monitoring bowel function --Troponin I was 3 on 01/01/22, Continue to monitor as we titrate up on Clozaril. Order immediately if chest pain.     3. Medical Issues Being Addressed: #Tobacco use disorder - Continue Nicotine patch 21 mg daily   #T2DM -- Due to the fact that patient is actively psychotic, only completing a.m. FSBS; will resume.  Patient on home regimen and diabetes well-controlled (A1c 6.9%), so minimal concern for decompensation. - Continue Metformin 1000 mg twice daily with meals - Continue Jardiance 10 mg every morning - A.m. CBG monitoring only   #Hyperlipidemia - Continue home atorvastatin 40 mg daily    #Acid reflux.  - Continue Protonix 40 mg po Q am  Due to symptoms of leg heaviness, psychosis, and previous history of potential nocturnal enuresis, we deemed it appropriate to order a CT Head to rule out NPH.    4. Discharge Planning:  -- Social work and case management to assist with discharge planning and identification of hospital follow-up needs prior to discharge -- Discharge Concerns: Need to establish a safety plan; Medication compliance and effectiveness -- Discharge Goals: Return home with outpatient referrals for mental health follow-up including medication management/psychotherapy --Pending AM labs and Head CT results, D/C is planned for tomorrow.   Vonna Drafts OMS-IV   Total Time Spent in Direct Patient Care:  I personally spent 35 minutes on the unit in direct patient  care. The direct patient care time included face-to-face time with the patient, reviewing the patient's chart, communicating with other professionals, and coordinating care. Greater than 50% of this time was spent in counseling or coordinating care with the patient regarding goals of hospitalization, psycho-education, and discharge planning needs.  I personally was present and performed or re-performed the history, physical exam and medical decision-making activities of this service and have verified that the service and findings are accurately documented in the student's note, , as addended by me or notated below:  Psychiatrically doing much better. Depakote labs tomorrow am.  There is concern for NPH - pt has some symptoms of magnetic gait (not objectively but subjectively feels it is difficult to lift feet off of floor, had reported urinary incontinence and has current urinary urgency, and had worsening mental status (better now, also very clear and convincing h/o schizophrenia). Will get CT. Talked with Dr. Ronnald Ramp neuroradiologist who recommended ct over mri bc pt has prosthetic rt testicle from 1988 w/o available documenting of the prosthesis.   Janine Limbo, MD Psychiatrist

## 2022-01-09 LAB — HEPATIC FUNCTION PANEL
ALT: 16 U/L (ref 0–44)
AST: 14 U/L — ABNORMAL LOW (ref 15–41)
Albumin: 3.7 g/dL (ref 3.5–5.0)
Alkaline Phosphatase: 64 U/L (ref 38–126)
Bilirubin, Direct: 0.2 mg/dL (ref 0.0–0.2)
Indirect Bilirubin: 0.1 mg/dL — ABNORMAL LOW (ref 0.3–0.9)
Total Bilirubin: 0.3 mg/dL (ref 0.3–1.2)
Total Protein: 7.2 g/dL (ref 6.5–8.1)

## 2022-01-09 LAB — VALPROIC ACID LEVEL: Valproic Acid Lvl: 42 ug/mL — ABNORMAL LOW (ref 50.0–100.0)

## 2022-01-09 LAB — GLUCOSE, CAPILLARY: Glucose-Capillary: 112 mg/dL — ABNORMAL HIGH (ref 70–99)

## 2022-01-09 LAB — AMMONIA: Ammonia: 45 umol/L — ABNORMAL HIGH (ref 9–35)

## 2022-01-09 MED ORDER — EMPAGLIFLOZIN 10 MG PO TABS: 10.0000 mg | ORAL_TABLET | Freq: Every day | ORAL | 0 refills | Status: AC

## 2022-01-09 MED ORDER — DIVALPROEX SODIUM 500 MG PO DR TAB: 500.0000 mg | DELAYED_RELEASE_TABLET | Freq: Two times a day (BID) | ORAL | Status: DC

## 2022-01-09 MED ORDER — TEMAZEPAM 15 MG PO CAPS: 15.0000 mg | ORAL_CAPSULE | Freq: Every evening | ORAL | 0 refills | Status: DC | PRN

## 2022-01-09 MED ORDER — ATORVASTATIN CALCIUM 40 MG PO TABS: 40.0000 mg | ORAL_TABLET | Freq: Every day | ORAL | 0 refills | Status: DC

## 2022-01-09 MED ORDER — NICOTINE 21 MG/24HR TD PT24: 21.0000 mg | MEDICATED_PATCH | Freq: Every day | TRANSDERMAL | 0 refills | Status: AC

## 2022-01-09 MED ORDER — TRAZODONE HCL 100 MG PO TABS: 100.0000 mg | ORAL_TABLET | Freq: Every evening | ORAL | 0 refills | Status: DC | PRN

## 2022-01-09 MED ORDER — MELATONIN 5 MG PO TABS: 5.0000 mg | ORAL_TABLET | Freq: Every evening | ORAL | 0 refills | Status: AC | PRN

## 2022-01-09 MED ORDER — DIVALPROEX SODIUM 500 MG PO DR TAB: 500.0000 mg | DELAYED_RELEASE_TABLET | Freq: Two times a day (BID) | ORAL | 0 refills | Status: DC

## 2022-01-09 MED ORDER — METFORMIN HCL 1000 MG PO TABS: 1000.0000 mg | ORAL_TABLET | Freq: Two times a day (BID) | ORAL | 0 refills | Status: DC

## 2022-01-09 MED ORDER — HALOPERIDOL 10 MG PO TABS: 10.0000 mg | ORAL_TABLET | Freq: Two times a day (BID) | ORAL | 0 refills | Status: DC

## 2022-01-09 MED ORDER — CLOZAPINE 50 MG PO TABS: 150.0000 mg | ORAL_TABLET | Freq: Every day | ORAL | 0 refills | Status: DC

## 2022-01-09 MED ORDER — SENNOSIDES-DOCUSATE SODIUM 8.6-50 MG PO TABS: 1.0000 | ORAL_TABLET | Freq: Every day | ORAL | 0 refills | Status: AC

## 2022-01-09 NOTE — [facility] Suicide Risk Assessment (Signed)
[facility] Discharge Suicide Risk Assessment   Principal Problem: Schizoaffective disorder, bipolar type [facility]) Discharge Diagnoses: Principal Problem:   Schizoaffective disorder, bipolar type (HCC) Active Problems:   Tobacco use disorder   Diabetes (HCC)   Hyperlipidemia   Total Time spent with patient: 20 minutes  [name] is a 46 year old male with a reported past psychiatric history of schizophrenia paranoid, bipolar disorder housing insecurity who presented to [facility] ED on 8/3 under IVC for psychosis and bizarre behaviors.   During the patient's hospitalization, patient had extensive initial psychiatric evaluation, and follow-up psychiatric evaluations every day.  Psychiatric diagnoses provided upon initial assessment:  Schizoaffective disorder, bipolar type (HCC) Tobacco use disorder Type 2 DM Hyperlipidemia  Patient's psychiatric medications were adjusted on admission:  - Restart home Zyprexa 10 mg twice daily - Continue Depakote ER 750 mg twice daily - Continue Cogentin 0.5 mg twice daily  During the hospitalization, other adjustments were made to the patient's psychiatric medication regimen:  -Zyprexa (home med) was titrated but eventually dc due to not able to reach full response -Abilify was re-started (home med) and given Abilify Maintena 400 mg LAI on  12-12-21 - this med was dc due to making symptoms worse -Clozapine was started for treatment resistant schizophrenia -zyprexa was switched to haldol. Haldol was titrated to 10 mg bid with good response -restoril was strated for insomnia and pt will bd dc on 15 mg qhs  -depakote was titrated to dose on day of dc which is DR 500 mg q12H (Depakote labs 01-09-22 at dose of ER 750 mg q12H: VA 42. Ammonia 45. LFTs wnl. -> therefore dose was reduced on day of dc to address elevated ammonia level). Ammonia level previously elevated and decreased after decrease in depakote dosing.    Gradually, patient started adjusting to  milieu.   Patient's care was discussed during the interdisciplinary team meeting every day during the hospitalization.  The patient denied having side effects to prescribed psychiatric medication. On the day of dc, Daily review of symptoms, specific for clozapine: Malaise/Sedation: Denies Chest pain: Denies Shortness of breath: Denies Exertional capacity: Denies Tachycardia: Denies Cough: Denies Sore Throat: Denies Fever: Denies Orthostatic hypotension (dizziness with standing): Denies Hypersalivation: Denies Constipation: Reports last bowel movement was yesterday Symptoms of GERD: Denies Nausea: Denies Nocturnal enuresis: Denies, none per nursing   Gradually, patient started adjusting to milieu. The patient was evaluated each day by a clinical provider to ascertain response to treatment. Improvement was noted by the patient's report of decreasing symptoms, improved sleep and appetite, affect, medication tolerance, behavior, and participation in unit programming.  Patient was asked each day to complete a self inventory noting mood, mental status, pain, new symptoms, anxiety and concerns.    Symptoms were reported as significantly decreased or resolved completely by discharge.   On day of discharge, the patient reports that their mood is stable. The patient denied having suicidal thoughts for more than 48 hours prior to discharge.  Patient denies having homicidal thoughts.  Patient denies having auditory hallucinations.  Patient denies any visual hallucinations or other symptoms of psychosis. The patient was motivated to continue taking medication with a goal of continued improvement in mental health.   The patient reports their target psychiatric symptoms of psychosis and bizarre behaviors, all responded well to the psychiatric medications, and the patient reports overall benefit other psychiatric hospitalization. Supportive psychotherapy was provided to the patient. The patient also  participated in regular group therapy while hospitalized. Coping skills, problem solving  as well as relaxation therapies were also part of the unit programming.  Labs were reviewed with the patient, and abnormal results were discussed with the patient.  The patient is able to verbalize their individual safety plan to this provider.  # It is recommended to the patient to continue psychiatric medications as prescribed, after discharge from the hospital.    # It is recommended to the patient to follow up with your outpatient psychiatric provider and PCP.  # It was discussed with the patient, the impact of alcohol, drugs, tobacco have been there overall psychiatric and medical wellbeing, and total abstinence from substance use was recommended the patient.ed.  # Prescriptions provided or sent directly to preferred pharmacy at discharge. Patient agreeable to plan. Given opportunity to ask questions. Appears to feel comfortable with discharge.    # In the event of worsening symptoms, the patient is instructed to call the crisis hotline, 911 and or go to the nearest ED for appropriate evaluation and treatment of symptoms. To follow-up with primary care provider for other medical issues, concerns and or health care needs  # Patient was discharged home with a plan to follow up as noted below.     Psychiatric Specialty Exam  Presentation  General Appearance: Appropriate for Environment; Casual; Fairly Groomed  Eye Contact:Good  Speech:Clear and Coherent; Normal Rate  Speech Volume:Normal  Handedness:Right   Mood and Affect  Mood:Euthymic  Duration of Depression Symptoms: Greater than two weeks  Affect:Full Range   Thought Process  Thought Processes:Linear  Descriptions of Associations:Intact  Orientation:Full (Time, Place and Person)  Thought Content:Logical  History of Schizophrenia/Schizoaffective disorder:Yes  Duration of Psychotic Symptoms:Greater than six  months  Hallucinations:Hallucinations: Auditory  Ideas of Reference:None (likely continuees to have some delusions but denies this on day of dc and is not preoccupied with delusions)  Suicidal Thoughts:Suicidal Thoughts: No  Homicidal Thoughts:Homicidal Thoughts: No   Sensorium  Memory:Immediate Good; Recent Good; Remote Good  Judgment:Fair  Insight:Fair   Executive Functions  Concentration:Fair  Attention Span:Fair  Recall:Fair  Fund of Knowledge:Fair  Language:Good (Calm)   Psychomotor Activity  Psychomotor Activity:Psychomotor Activity: Normal (no eps on day of dc. aims score zero on day of dc.)   Assets  Assets:Communication Skills; Physical Health; Social Support   Sleep  Sleep:Sleep: Good Number of Hours of Sleep: 7.25   Physical Exam: Physical Exam See discharge summary  ROS See discharge summary  Blood pressure 101/76, pulse 94, temperature 98.5 F (36.9 C), temperature source Oral, resp. rate 17, height 6\' 1"  (1.854 m), weight 117.9 kg, SpO2 100 %. Body mass index is 34.3 kg/m.  Mental Status Per Nursing Assessment::   On Admission:  NA  Demographic factors:  Male, Living alone, Unemployed Loss Factors:  Loss of significant relationship Historical Factors:  Family history of mental illness or substance abuse, Impulsivity Risk Reduction Factors:  Positive social support  Continued Clinical Symptoms:  Schizoaffective d/o bipolar type- psychosis minimal and at baseline. Mood is stable. Sleep is stable. Denies SI, HI.   Cognitive Features That Contribute To Risk:  None    Suicide Risk:  Mild:  There are no identifiable suicide plans, no associated intent, mild dysphoria and related symptoms, good self-control (both objective and subjective assessment), few other risk factors, and identifiable protective factors, including available and accessible social support.     Plan Of Care/Follow-up recommendations:   Activity: as  tolerated  Diet: heart healthy  Other: -Follow-up with your outpatient psychiatric provider -instructions on appointment  date, time, and address (location) are provided to you in discharge paperwork.  -Take your psychiatric medications as prescribed at discharge - instructions are provided to you in the discharge paperwork  -Follow-up with outpatient primary care doctor and other specialists -for management of preventative medicine and chronic medical disease, including: diabetes type 2  -Testing: Follow-up with outpatient provider for abnormal lab results:  Weekly CBC-D: last CBC-D on 01-08-2022: [facility] 5300  Depakote labs 01-09-22 at dose of ER 750 mg q12H: VA 42. Ammonia 45. LFTs wnl. -> therefore dose was reduced on day of dc to address elevated ammonia level. Ammonia level previously elevated and decreased after decrease in depakote dosing w/o need for lactulose.   -Recommend abstinence from alcohol, tobacco, and other illicit drug use at discharge.   -If your psychiatric symptoms recur, worsen, or if you have side effects to your psychiatric medications, call your outpatient psychiatric provider, 911, [phone] or go to the nearest emergency department.  -If suicidal thoughts recur, call your outpatient psychiatric provider, 911, [phone] or go to the nearest emergency department.   Cristy Hilts, MD 01/09/2022, 11:15 AM

## 2022-01-09 NOTE — Care Management Important Message (Signed)
Important Message  Patient Details  Name: [name] MRN: [id number] Date of Birth: 1976-02-24   Medicare Important Message Given:  Yes  Patient informed of right to appeal discharge, provided phone number to [facility] Patient expressed no interest in appealing discharge at this time. CSW will continue to monitor situation.  Corky Crafts, LCSWA 01/09/2022, 11:44 AM

## 2022-01-09 NOTE — Progress Notes (Signed)
   01/09/22 1100  Psych Admission Type (Psych Patients Only)  Admission Status Involuntary  Psychosocial Assessment  Patient Complaints None  Eye Contact Fair  Facial Expression Flat  Affect Appropriate to circumstance  Speech Logical/coherent  Interaction Assertive  Motor Activity Pacing  Appearance/Hygiene Unremarkable  Behavior Characteristics Cooperative  Mood Preoccupied  Aggressive Behavior  Effect No apparent injury  Thought Process  Coherency WDL  Content WDL  Delusions None reported or observed  Perception WDL  Hallucination None reported or observed  Judgment Impaired  Confusion WDL  Danger to Self  Current suicidal ideation? Denies  Danger to Others  Danger to Others None reported or observed
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
## 2022-01-09 NOTE — Progress Notes (Signed)
Pt left facility at 1150pm. Pt left with guardian. Pt removed all belongings, valuables, prescription, and pt and guardian verbalized understanding of medications and discharge instructions as well as follow up care. Pts guardian expressed concern that he will not be med compliant as he lives at home alone. She states she will do her best to oversee.

## 2022-01-09 NOTE — [facility] Group Notes (Signed)
  Adult Psychoeducational Group Note  Date:  01/09/2022 Time:  10:05 AM  Group Topic/Focus:  Goals Group:   The focus of this group is to help patients establish daily goals to achieve during treatment and discuss how the patient can incorporate goal setting into their daily lives to aide in recovery.  Participation Level:  Active  Participation Quality:  Appropriate  Affect:  Appropriate  Cognitive:  Appropriate  Insight: Appropriate  Engagement in Group:  Engaged  Modes of Intervention:  Discussion   Donell Beers 01/09/2022, 10:05 AM

## 2022-01-09 NOTE — Progress Notes (Signed)
   01/09/22 0515  Sleep  Number of Hours 7.25

## 2022-01-09 NOTE — Progress Notes (Signed)
[facility] LCSW Progress Note   01/09/2022    11:35 AM  [name]   [id number]   Type of Contact and Topic:  Guardian Contact     01/09/22 1134  Legal Guardian  Does Patient Have a Court Appointed Legal Guardian? Yes  Legal Guardian Mother  Legal Guardian Contact Information [name], mother/guardian, [phone]  Copy of Legal Guardianship Form in Chart No - copy requested  Legal Guardian Notified of Arrival  Successfully notified  Legal Guardian Notified of Pending Discharge  Successfully notified      Signed:  Corky Crafts, MSW, LCSWA, LCAS 01/09/2022 11:35 AM

## 2022-01-09 NOTE — Progress Notes (Signed)
  [facility] Adult Case Management Discharge Plan :  Will you be returning to the same living situation after discharge:  Yes,  Patient to return to place of residence.  At discharge, do you have transportation home?: Yes,  Patient's family to provide transportation from hospital.  Do you have the ability to pay for your medications: Yes,  [employer]   Release of information consent forms completed and in the chart;  Patient's signature needed at discharge.  Patient to Follow up at:  Follow-up Information     [facility], [facility]. Call.   Why: Your actt team will be seeing you shortly after discharge. If your do not hear from someone within 72 hours please call the number provided [phone]. Contact information: [address] [place] [address] [phone]                 Next level of care provider has access to [facility] Link:no  Safety Planning and Suicide Prevention discussed: Yes,  SPE completed with patient and Tirth Cothron, father,  [phone]   Has patient been referred to the Quitline?: Patient refused referral Tobacco Use: High Risk (12/11/2021)   Patient History    Smoking Tobacco Use: Every Day    Smokeless Tobacco Use: Never    Passive Exposure: Not on file   Patient has been referred for addiction treatment: Pt. refused referral Social History   Substance and Sexual Activity  Drug Use Yes   Types: Marijuana   Comment: smokes hemp   Social History   Substance and Sexual Activity  Alcohol Use Not Currently   Comment: weekly   Corky Crafts, LCSWA 01/09/2022, 11:33 AM

## 2022-01-27 ENCOUNTER — Emergency Department (HOSPITAL_COMMUNITY)
Admission: EM | Admit: 2022-01-27 | Discharge: 2022-01-27 | Disposition: A | Discharge status: Home / Self Care | Payer: Medicare Other | Source: Home / Self Care

## 2022-02-01 ENCOUNTER — Telehealth: Payer: Self-pay

## 2022-02-01 NOTE — Telephone Encounter (Signed)
        Patient  visited [facility] on 10/1    Telephone encounter attempt :    Unable to [place], [place] [facility]  [phone] [address], [place], [place] [address] Phone: [phone] Email: [email or web address]

## 2022-02-04 ENCOUNTER — Emergency Department (HOSPITAL_COMMUNITY)
Admission: EM | Admit: 2022-02-04 | Discharge: 2022-02-05 | Disposition: A | Discharge status: Home / Self Care | Payer: Medicare Other | Attending: Emergency Medicine | Admitting: Emergency Medicine

## 2022-02-04 ENCOUNTER — Other Ambulatory Visit: Payer: Self-pay

## 2022-02-04 ENCOUNTER — Encounter (HOSPITAL_COMMUNITY): Payer: Self-pay | Admitting: Emergency Medicine

## 2022-02-04 DIAGNOSIS — Z139 Encounter for screening, unspecified: Secondary | ICD-10-CM | POA: Insufficient documentation

## 2022-02-04 DIAGNOSIS — Z0000 Encounter for general adult medical examination without abnormal findings: Secondary | ICD-10-CM | POA: Diagnosis not present

## 2022-02-04 DIAGNOSIS — R443 Hallucinations, unspecified: Secondary | ICD-10-CM | POA: Diagnosis present

## 2022-02-04 NOTE — ED Notes (Addendum)
Called x1

## 2022-02-04 NOTE — ED Triage Notes (Signed)
Pt reports he walked to the ED and states he came in to give blood and have his blood tested; pt denies HI/SI; pt states he has multiple spider bites and snakes in his apartment

## 2022-02-04 NOTE — ED Notes (Signed)
Called x2

## 2022-02-04 NOTE — ED Notes (Signed)
Pt called x 1 no answer, staff looked outside for pt as well

## 2022-02-05 NOTE — ED Provider Notes (Signed)
  [facility] Emergency Department Provider Note MRN:  [id number]  Arrival date & time: 02/05/22     Chief Complaint   Hallucinations   History of Present Illness   [name] is a 46 y.o. year-old male presents to the ED with chief complaint of requesting to have his blood donated or tested.  He states that he would prefer to have his blood donated, but if he cannot do that, he would like to have his blood tested.  He states that he does not have any other complaints at this time.  Hx of schizoeffective.  History provided by patient.   Review of Systems  Pertinent positive and negative review of systems noted in HPI.    Physical Exam   Vitals:   02/04/22 2125  BP: (!) 164/98  Pulse: 96  Resp: 20  Temp: 98.6 F (37 C)  SpO2: 99%    CONSTITUTIONAL:  well-appearing, NAD NEURO:  Alert and oriented x 3, CN 3-12 grossly intact EYES:  eyes equal and reactive ENT/NECK:  Supple, no stridor  CARDIO:  appears well-perfused  PULM:  No respiratory distress,  GI/GU:  non-distended,  MSK/SPINE:  No gross deformities, no edema, moves all extremities  SKIN:  no rash, atraumatic   *Additional and/or pertinent findings included in MDM below  Diagnostic and Interventional Summary    Labs Reviewed - No data to display  No orders to display    Medications - No data to display   Procedures  /  Critical Care Procedures  ED Course and Medical Decision Making  I have reviewed the triage vital signs, the nursing notes, and pertinent available records from the EMR.  Social Determinants Affecting Complexity of Care: Patient has decreased access to medical care.   ED Course:    Medical Decision Making Patient here requesting to donate his blood.  Denies any other complaints to me at this time.  He is calm and oriented.  I do not believe him to be in need of emergent hospitalization or psychiatric stabilization.  I do not find him to be a danger to  himself or to others.  We discussed that blood donation does not occur in the emergency department, but I have given him some resources for where he might be able to do this should he desire it.     Consultants: No consultations were needed in caring for this patient.   Treatment and Plan: Emergency department workup does not suggest an emergent condition requiring admission or immediate intervention beyond  what has been performed at this time. The patient is safe for discharge and has  been instructed to return immediately for worsening symptoms, change in  symptoms or any other concerns    Final Clinical Impressions(s) / ED Diagnoses     ICD-10-CM   1. Encounter for medical screening examination  Z13.9       ED Discharge Orders     None         Discharge Instructions Discussed with and Provided to Patient:    Discharge Instructions      Unfortunately, I was unable to locate any blood donation centers in [place].  There are several in [place].  Occasionally community church's will hold blood drives.  Please follow-up with your doctor.   Return if symptoms change or worsen.      Montine Circle, PA-C 02/05/22 [id number]    Veryl Speak, MD 02/05/22 [phone]

## 2022-02-05 NOTE — Discharge Instructions (Signed)
Unfortunately, I was unable to locate any blood donation centers in [place].  There are several in [place].  Occasionally community church's will hold blood drives.  Please follow-up with your doctor.   Return if symptoms change or worsen.

## 2022-02-15 ENCOUNTER — Emergency Department (HOSPITAL_COMMUNITY)
Admission: EM | Admit: 2022-02-15 | Discharge: 2022-02-15 | Disposition: A | Discharge status: Home / Self Care | Payer: Medicare Other | Attending: Emergency Medicine | Admitting: Emergency Medicine

## 2022-02-15 ENCOUNTER — Encounter (HOSPITAL_COMMUNITY): Payer: Self-pay | Admitting: Emergency Medicine

## 2022-02-15 DIAGNOSIS — S50812A Abrasion of left forearm, initial encounter: Secondary | ICD-10-CM | POA: Insufficient documentation

## 2022-02-15 DIAGNOSIS — W5509XA Other contact with cat, initial encounter: Secondary | ICD-10-CM | POA: Diagnosis not present

## 2022-02-15 DIAGNOSIS — Z23 Encounter for immunization: Secondary | ICD-10-CM | POA: Insufficient documentation

## 2022-02-15 DIAGNOSIS — S59912A Unspecified injury of left forearm, initial encounter: Secondary | ICD-10-CM | POA: Diagnosis present

## 2022-02-15 DIAGNOSIS — T148XXA Other injury of unspecified body region, initial encounter: Secondary | ICD-10-CM

## 2022-02-15 MED ORDER — AMOXICILLIN-POT CLAVULANATE 875-125 MG PO TABS: 1.0000 | ORAL_TABLET | Freq: Two times a day (BID) | ORAL | 0 refills | Status: DC

## 2022-02-15 MED ORDER — TETANUS-DIPHTH-ACELL PERTUSSIS 5-2.5-18.5 LF-MCG/0.5 IM SUSY
0.5000 mL | PREFILLED_SYRINGE | Freq: Once | INTRAMUSCULAR | Status: AC
  Administered 2022-02-15: 0.5 mL via INTRAMUSCULAR
  Filled 2022-02-15: qty 0.5

## 2022-02-15 NOTE — Discharge Instructions (Signed)
Make sure to keep her abrasions cleaned.  Apply antibiotic ointment.  Take antibiotics as prescribed.  If you notice redness or worsening pain, you should be reevaluated.

## 2022-02-15 NOTE — ED Provider Notes (Signed)
[facility] EMERGENCY DEPARTMENT Provider Note   CSN: [id number] Arrival date & time: 02/15/22  0018     History  Chief Complaint  Patient presents with   Animal Bite    [name] is a 46 y.o. male.  HPI     This a 46 year old male who presents with concerns for possible cat bite or scratch.  Patient is a very poor historian.  He is well-known to our emergency department.  He states that he woke up in his apartment with a cat.  He believes he was bitten on the left forearm.  He did not feel the cat bite him or see the cat bite him.  He noticed to small abrasions over the forearm when he woke up yesterday morning.  His last tetanus was unknown.  He states he also "gets bitten by spiders a lot."  Does not have any spider bites.  Home Medications Prior to Admission medications   Medication Sig Start Date End Date Taking? Authorizing Provider  amoxicillin-clavulanate (AUGMENTIN) 875-125 MG tablet Take 1 tablet by mouth every 12 (twelve) hours. 02/15/22  Yes Dovid Bartko, Barbette Hair, MD  atorvastatin (LIPITOR) 40 MG tablet Take 1 tablet (40 mg total) by mouth at bedtime. 01/09/22 02/08/22  Massengill, Ovid Curd, MD  cloZAPine (CLOZARIL) 50 MG tablet Take 3 tablets (150 mg total) by mouth at bedtime for 28 days. 01/09/22 02/06/22  Massengill, Ovid Curd, MD  divalproex (DEPAKOTE) 500 MG DR tablet Take 1 tablet (500 mg total) by mouth every 12 (twelve) hours. 01/09/22 02/08/22  Massengill, Ovid Curd, MD  haloperidol (HALDOL) 10 MG tablet Take 1 tablet (10 mg total) by mouth every 12 (twelve) hours. 01/09/22 02/08/22  Massengill, Ovid Curd, MD  metFORMIN (GLUCOPHAGE) 1000 MG tablet Take 1 tablet (1,000 mg total) by mouth 2 (two) times daily with a meal. 01/09/22 02/08/22  Massengill, Ovid Curd, MD  temazepam (RESTORIL) 15 MG capsule Take 1 capsule (15 mg total) by mouth at bedtime as needed for up to 14 days for sleep. 01/09/22 01/23/22  Massengill, Ovid Curd, MD  traZODone (DESYREL) 100 MG tablet Take 1 tablet (100  mg total) by mouth at bedtime as needed for sleep. 01/09/22 02/08/22  Janine Limbo, MD      Allergies    Other    Review of Systems   Review of Systems  Skin:  Positive for wound.  All other systems reviewed and are negative.   Physical Exam Updated Vital Signs BP 123/83 (BP Location: Left Arm)   Pulse 85   Temp 98.2 F (36.8 C) (Oral)   Resp 20   Ht 1.854 m (6\' 1" )   Wt 117.9 kg   SpO2 96%   BMI 34.30 kg/m  Physical Exam Vitals and nursing note reviewed.  Constitutional:      Appearance: He is well-developed. He is not ill-appearing.  HENT:     Head: Normocephalic and atraumatic.  Eyes:     Pupils: Pupils are equal, round, and reactive to light.  Cardiovascular:     Rate and Rhythm: Normal rate and regular rhythm.  Pulmonary:     Effort: Pulmonary effort is normal. No respiratory distress.  Abdominal:     Palpations: Abdomen is soft.     Tenderness: There is no abdominal tenderness.  Musculoskeletal:     Cervical back: Neck supple.  Lymphadenopathy:     Cervical: No cervical adenopathy.  Skin:    General: Skin is warm and dry.     Comments: 2 linear abrasions 0.5 cm left  forearm, healing, no active bleeding, slight erythema, no fluctuance or induration, no drainage  Neurological:     Mental Status: He is alert and oriented to person, place, and time.  Psychiatric:        Mood and Affect: Mood normal.     ED Results / Procedures / Treatments   Labs (all labs ordered are listed, but only abnormal results are displayed) Labs Reviewed - No data to display  EKG None  Radiology No results found.  Procedures Procedures    Medications Ordered in ED Medications  Tdap (BOOSTRIX) injection 0.5 mL (has no administration in time range)    ED Course/ Medical Decision Making/ A&P                           Medical Decision Making Risk Prescription drug management.   This patient presents to the ED for concern of possible cat bite, this involves  an extensive number of treatment options, and is a complaint that carries with it a high risk of complications and morbidity.  I considered the following differential and admission for this acute, potentially life threatening condition.  The differential diagnosis includes bite, abrasion, laceration  MDM:    This is a 46 year old male who presents with concern for possible cat bite.  He is nontoxic and vital signs are reassuring.  He has 2 small abrasions over the left forearm.  They do not appear overtly infected.  They appear fairly well-healing and I suspect they may have been there longer than the patient indicated.  They do not clearly have the wrist except for bite but are more linear abrasions/lacerations.  I did update the patient's tetanus and have offered him Augmentin given his concerns.  He states that the cat that was in his apartment had a collar and a tag.  Feel that this is very low risk for potential rabies.  We will hold off on rabies prophylaxis at this time as I have low suspicion for bite rather suspect abrasion.  I spoke to the patient's mother regarding the plan and she is agreeable.  (Labs, imaging, consults)  Labs: I Ordered, and personally interpreted labs.  The pertinent results include: None  Imaging Studies ordered: I ordered imaging studies including none I independently visualized and interpreted imaging. I agree with the radiologist interpretation  Additional history obtained from review.  External records from outside source obtained and reviewed including prior evaluations  Cardiac Monitoring: The patient was maintained on a cardiac monitor.  I personally viewed and interpreted the cardiac monitored which showed an underlying rhythm of: Sinus rhythm  Reevaluation: After the interventions noted above, I reevaluated the patient and found that they have :stayed the same  Social Determinants of Health: History of schizophrenia, has legal guardian  Disposition:  Discharge  Co morbidities that complicate the patient evaluation  Past Medical History:  Diagnosis Date   Bipolar disorder ([place])    Bronchitis    Hyperlipidemia    Paranoid schizophrenia ([place])    Schizophrenia ([place])      Medicines Meds ordered this encounter  Medications   Tdap (BOOSTRIX) injection 0.5 mL   amoxicillin-clavulanate (AUGMENTIN) 875-125 MG tablet    Sig: Take 1 tablet by mouth every 12 (twelve) hours.    Dispense:  14 tablet    Refill:  0    I have reviewed the patients home medicines and have made adjustments as needed  Problem List / ED Course: Problem  List Items Addressed This Visit   None Visit Diagnoses     Abrasion    -  Primary                   Final Clinical Impression(s) / ED Diagnoses Final diagnoses:  Abrasion    Rx / DC Orders ED Discharge Orders          Ordered    amoxicillin-clavulanate (AUGMENTIN) 875-125 MG tablet  Every 12 hours        02/15/22 0119              Merryl Hacker, MD 02/15/22 [phone]

## 2022-02-15 NOTE — ED Triage Notes (Signed)
Pt states he walked to ED tonight to have his blood checked for rabies. Pt states he thinks he was bite by a cat and continues to have spider bites. Pt denies SI/HI. Pt admits to not taking all of his medications as prescribed.

## 2022-02-25 DIAGNOSIS — Z7984 Long term (current) use of oral hypoglycemic drugs: Secondary | ICD-10-CM | POA: Diagnosis not present

## 2022-02-25 DIAGNOSIS — Z760 Encounter for issue of repeat prescription: Secondary | ICD-10-CM | POA: Diagnosis not present

## 2022-02-26 ENCOUNTER — Encounter (HOSPITAL_COMMUNITY): Payer: Self-pay | Admitting: Emergency Medicine

## 2022-02-26 ENCOUNTER — Other Ambulatory Visit: Payer: Self-pay

## 2022-02-26 ENCOUNTER — Emergency Department (HOSPITAL_COMMUNITY)
Admission: EM | Admit: 2022-02-26 | Discharge: 2022-02-26 | Disposition: A | Discharge status: Home / Self Care | Payer: Medicare Other | Attending: Emergency Medicine | Admitting: Emergency Medicine

## 2022-02-26 DIAGNOSIS — Z23 Encounter for immunization: Secondary | ICD-10-CM | POA: Diagnosis not present

## 2022-02-26 DIAGNOSIS — Z760 Encounter for issue of repeat prescription: Secondary | ICD-10-CM

## 2022-02-26 DIAGNOSIS — R634 Abnormal weight loss: Secondary | ICD-10-CM | POA: Diagnosis not present

## 2022-02-26 DIAGNOSIS — E785 Hyperlipidemia, unspecified: Secondary | ICD-10-CM | POA: Diagnosis not present

## 2022-02-26 DIAGNOSIS — E1169 Type 2 diabetes mellitus with other specified complication: Secondary | ICD-10-CM | POA: Diagnosis not present

## 2022-02-26 MED ORDER — EMPAGLIFLOZIN 25 MG PO TABS: 25.0000 mg | ORAL_TABLET | Freq: Every day | ORAL | 0 refills | Status: DC

## 2022-02-26 MED ORDER — DIVALPROEX SODIUM 500 MG PO DR TAB: 500.0000 mg | DELAYED_RELEASE_TABLET | Freq: Two times a day (BID) | ORAL | 0 refills | Status: DC

## 2022-02-26 NOTE — Discharge Instructions (Signed)
Follow-up with your primary doctor for future refills of your medications. 

## 2022-02-26 NOTE — ED Triage Notes (Signed)
Pt states he needs a month refill of antibiotics for snake bite, spider bite and cat bite.

## 2022-02-26 NOTE — ED Provider Notes (Signed)
[facility] EMERGENCY DEPARTMENT Provider Note   CSN: [id number] Arrival date & time: 02/25/22  2321     History  Chief Complaint  Patient presents with   Medication Refill    [name] is a 46 y.o. male.  Patient is a 46 year old male with history of schizoaffective disorder, paranoia.  Patient presenting today for evaluation of medication refill.  He is requesting refills of his Jardiance and Depakote.  He reports being out of these for several days.  He also requests antibiotics to take for a month to get "the infection out of his body".  He denies to me he is having any other symptoms.  The history is provided by the patient.       Home Medications Prior to Admission medications   Medication Sig Start Date End Date Taking? Authorizing Provider  empagliflozin (JARDIANCE) 25 MG TABS tablet Take 1 tablet (25 mg total) by mouth daily before breakfast. 02/26/22  Yes Locklan Canoy, Nathaneil Canary, MD  amoxicillin-clavulanate (AUGMENTIN) 875-125 MG tablet Take 1 tablet by mouth every 12 (twelve) hours. 02/15/22   Horton, Barbette Hair, MD  atorvastatin (LIPITOR) 40 MG tablet Take 1 tablet (40 mg total) by mouth at bedtime. 01/09/22 02/08/22  Massengill, Ovid Curd, MD  cloZAPine (CLOZARIL) 50 MG tablet Take 3 tablets (150 mg total) by mouth at bedtime for 28 days. 01/09/22 02/06/22  Massengill, Ovid Curd, MD  divalproex (DEPAKOTE) 500 MG DR tablet Take 1 tablet (500 mg total) by mouth every 12 (twelve) hours. 02/26/22 03/28/22  Veryl Speak, MD  haloperidol (HALDOL) 10 MG tablet Take 1 tablet (10 mg total) by mouth every 12 (twelve) hours. 01/09/22 02/08/22  Massengill, Ovid Curd, MD  metFORMIN (GLUCOPHAGE) 1000 MG tablet Take 1 tablet (1,000 mg total) by mouth 2 (two) times daily with a meal. 01/09/22 02/08/22  Massengill, Ovid Curd, MD  temazepam (RESTORIL) 15 MG capsule Take 1 capsule (15 mg total) by mouth at bedtime as needed for up to 14 days for sleep. 01/09/22 01/23/22  Massengill, Ovid Curd, MD  traZODone  (DESYREL) 100 MG tablet Take 1 tablet (100 mg total) by mouth at bedtime as needed for sleep. 01/09/22 02/08/22  Massengill, Ovid Curd, MD      Allergies    Other    Review of Systems   Review of Systems  All other systems reviewed and are negative.   Physical Exam Updated Vital Signs BP 132/84 (BP Location: Right Arm)   Pulse 91   Temp 97.7 F (36.5 C) (Oral)   Resp 20   Ht 6\' 1"  (1.854 m)   Wt 117 kg   SpO2 95%   BMI 34.03 kg/m  Physical Exam Vitals and nursing note reviewed.  Constitutional:      General: He is not in acute distress.    Appearance: He is well-developed. He is not diaphoretic.  HENT:     Head: Normocephalic and atraumatic.  Cardiovascular:     Rate and Rhythm: Normal rate and regular rhythm.     Heart sounds: No murmur heard.    No friction rub.  Pulmonary:     Effort: Pulmonary effort is normal. No respiratory distress.     Breath sounds: Normal breath sounds. No wheezing or rales.  Abdominal:     General: Bowel sounds are normal. There is no distension.     Palpations: Abdomen is soft.     Tenderness: There is no abdominal tenderness.  Musculoskeletal:        General: Normal range of motion.  Cervical back: Normal range of motion and neck supple.  Skin:    General: Skin is warm and dry.  Neurological:     Mental Status: He is alert and oriented to person, place, and time.     Coordination: Coordination normal.  Psychiatric:        Attention and Perception: Attention normal.        Mood and Affect: Mood normal.        Speech: Speech normal.     Comments: Somewhat delusional, however this is his baseline.  I have seen this patient many times in the past.     ED Results / Procedures / Treatments   Labs (all labs ordered are listed, but only abnormal results are displayed) Labs Reviewed - No data to display  EKG None  Radiology No results found.  Procedures Procedures    Medications Ordered in ED Medications - No data to  display  ED Course/ Medical Decision Making/ A&P  Patient presenting here with request of a medication refill.  Medications he is requesting will be prescribed.  Patient is to follow-up with primary doctor.  Final Clinical Impression(s) / ED Diagnoses Final diagnoses:  None    Rx / DC Orders ED Discharge Orders          Ordered    divalproex (DEPAKOTE) 500 MG DR tablet  Every 12 hours        02/26/22 0217    empagliflozin (JARDIANCE) 25 MG TABS tablet  Daily before breakfast        02/26/22 0217              Geoffery Lyons, MD 02/26/22 [phone]

## 2022-02-27 ENCOUNTER — Emergency Department (HOSPITAL_COMMUNITY)
Admission: EM | Admit: 2022-02-27 | Discharge: 2022-02-28 | Disposition: A | Discharge status: Home / Self Care | Payer: Medicare Other | Attending: Emergency Medicine | Admitting: Emergency Medicine

## 2022-02-27 ENCOUNTER — Other Ambulatory Visit: Payer: Self-pay

## 2022-02-27 ENCOUNTER — Encounter (HOSPITAL_COMMUNITY): Payer: Self-pay

## 2022-02-27 DIAGNOSIS — F259 Schizoaffective disorder, unspecified: Secondary | ICD-10-CM | POA: Insufficient documentation

## 2022-02-27 DIAGNOSIS — Z5900 Homelessness unspecified: Secondary | ICD-10-CM | POA: Diagnosis not present
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
## 2022-02-28 NOTE — Discharge Instructions (Signed)
Continue medications as previously prescribed.  Follow-up with your counselor/social worker tomorrow.

## 2022-02-28 NOTE — ED Notes (Signed)
ED Provider at bedside. 

## 2022-03-05 ENCOUNTER — Other Ambulatory Visit: Payer: Self-pay

## 2022-03-05 ENCOUNTER — Emergency Department (HOSPITAL_COMMUNITY)
Admission: EM | Admit: 2022-03-05 | Discharge: 2022-03-05 | Disposition: A | Discharge status: Home / Self Care | Payer: Medicare Other | Attending: Emergency Medicine | Admitting: Emergency Medicine

## 2022-03-05 ENCOUNTER — Encounter (HOSPITAL_COMMUNITY): Payer: Self-pay

## 2022-03-05 DIAGNOSIS — Z7984 Long term (current) use of oral hypoglycemic drugs: Secondary | ICD-10-CM | POA: Insufficient documentation

## 2022-03-05 DIAGNOSIS — Z5900 Homelessness unspecified: Secondary | ICD-10-CM | POA: Diagnosis not present

## 2022-03-05 DIAGNOSIS — I10 Essential (primary) hypertension: Secondary | ICD-10-CM | POA: Diagnosis not present

## 2022-03-05 DIAGNOSIS — E119 Type 2 diabetes mellitus without complications: Secondary | ICD-10-CM | POA: Insufficient documentation

## 2022-03-05 DIAGNOSIS — F200 Paranoid schizophrenia: Secondary | ICD-10-CM | POA: Insufficient documentation

## 2022-03-05 DIAGNOSIS — Z760 Encounter for issue of repeat prescription: Secondary | ICD-10-CM | POA: Diagnosis not present

## 2022-03-05 DIAGNOSIS — Z91148 Patient's other noncompliance with medication regimen for other reason: Secondary | ICD-10-CM | POA: Diagnosis not present

## 2022-03-05 LAB — CBG MONITORING, ED: Glucose-Capillary: 102 mg/dL — ABNORMAL HIGH (ref 70–99)

## 2022-03-05 MED ORDER — METFORMIN HCL 1000 MG PO TABS: 1000.0000 mg | ORAL_TABLET | Freq: Two times a day (BID) | ORAL | 2 refills | Status: DC

## 2022-03-05 MED ORDER — EMPAGLIFLOZIN 25 MG PO TABS: 25.0000 mg | ORAL_TABLET | Freq: Every day | ORAL | 2 refills | Status: DC

## 2022-03-05 MED ORDER — TRAZODONE HCL 100 MG PO TABS: 100.0000 mg | ORAL_TABLET | Freq: Every evening | ORAL | 2 refills | Status: DC | PRN

## 2022-03-05 NOTE — Discharge Instructions (Addendum)
Your blood sugar was 102.  You have been given new prescriptions for the medications that you were missing.  Please resume taking these medications.  Follow-up with your primary care provider for ongoing management of your blood sugar.

## 2022-03-05 NOTE — ED Triage Notes (Signed)
Pt states he would like to have his blood pressure checked. Denies any symptoms.

## 2022-03-05 NOTE — ED Provider Notes (Signed)
[facility] EMERGENCY DEPARTMENT Provider Note   CSN: [id number] Arrival date & time: 03/05/22  6962     History  Chief Complaint  Patient presents with   Hypertension    [name] is a 46 y.o. male.  The history is provided by the patient. The history is limited by the condition of the patient (Psychiatric disorder).  Hypertension  He has history diabetes, hyperlipidemia, paranoid schizophrenia and numbness in stating that he wants his blood sugar checked because his pharmacy was not able to refill his empagliflozin and he has been out for at least a week.  He also states that sometimes his medication disappears and he does not have his trazodone and he needs a refill on that.  He also states that he had run out of his metformin and needs a refill of that.   Home Medications Prior to Admission medications   Medication Sig Start Date End Date Taking? Authorizing Provider  amoxicillin-clavulanate (AUGMENTIN) 875-125 MG tablet Take 1 tablet by mouth every 12 (twelve) hours. 02/15/22   Horton, Barbette Hair, MD  atorvastatin (LIPITOR) 40 MG tablet Take 1 tablet (40 mg total) by mouth at bedtime. 01/09/22 02/08/22  Massengill, Ovid Curd, MD  cloZAPine (CLOZARIL) 50 MG tablet Take 3 tablets (150 mg total) by mouth at bedtime for 28 days. 01/09/22 02/06/22  Massengill, Ovid Curd, MD  divalproex (DEPAKOTE) 500 MG DR tablet Take 1 tablet (500 mg total) by mouth every 12 (twelve) hours. 02/26/22 03/28/22  Veryl Speak, MD  empagliflozin (JARDIANCE) 25 MG TABS tablet Take 1 tablet (25 mg total) by mouth daily before breakfast. 02/26/22   Veryl Speak, MD  haloperidol (HALDOL) 10 MG tablet Take 1 tablet (10 mg total) by mouth every 12 (twelve) hours. 01/09/22 02/08/22  Massengill, Ovid Curd, MD  metFORMIN (GLUCOPHAGE) 1000 MG tablet Take 1 tablet (1,000 mg total) by mouth 2 (two) times daily with a meal. 01/09/22 02/08/22  Massengill, Ovid Curd, MD  temazepam (RESTORIL) 15 MG capsule Take 1 capsule (15 mg  total) by mouth at bedtime as needed for up to 14 days for sleep. 01/09/22 01/23/22  Massengill, Ovid Curd, MD  traZODone (DESYREL) 100 MG tablet Take 1 tablet (100 mg total) by mouth at bedtime as needed for sleep. 01/09/22 02/08/22  Massengill, Ovid Curd, MD      Allergies    Other    Review of Systems   Review of Systems  Unable to perform ROS: Psychiatric disorder    Physical Exam Updated Vital Signs BP (!) 143/87   Pulse (!) 102   Temp 98.4 F (36.9 C)   Resp 15   Ht 6\' 1"  (1.854 m)   Wt 117 kg   SpO2 97%   BMI 34.03 kg/m  Physical Exam Vitals and nursing note reviewed.   46 year old male, resting comfortably and in no acute distress. Vital signs are significant for borderline elevated blood pressure and borderline elevated heart rate. Oxygen saturation is 97%, which is normal. Head is normocephalic and atraumatic. PERRLA, EOMI. Oropharynx is clear. Neck is nontender and supple without adenopathy or JVD. Back is nontender and there is no CVA tenderness. Lungs are clear without rales, wheezes, or rhonchi. Chest is nontender. Heart has regular rate and rhythm without murmur. Abdomen is soft, flat, nontender. Extremities have no cyanosis or edema, full range of motion is present. Skin is warm and dry without rash. Neurologic: Mental status is normal, cranial nerves are intact, moves all extremities equally. Psychiatric: Patient with somewhat rambling speech and  flight of ideas but does not appear to be responding to internal stimuli.  ED Results / Procedures / Treatments   Labs (all labs ordered are listed, but only abnormal results are displayed) Labs Reviewed  CBG MONITORING, ED - Abnormal; Notable for the following components:      Result Value   Glucose-Capillary 102 (*)    All other components within normal limits   Procedures Procedures    Medications Ordered in ED Medications - No data to display  ED Course/ Medical Decision Making/ A&P                            Medical Decision Making  Medication noncompliance with several medications missing including metformin, trazodone, empagliflozin.  I have ordered a blood glucose measurement and it has come back minimally elevated at 102.  My interpretation is that this is a satisfactory blood glucose.  I am sending prescriptions for empagliflozin, metformin, trazodone.  He does admit to being homeless, I have included shelter resources with his discharge information.  Final Clinical Impression(s) / ED Diagnoses Final diagnoses:  Homelessness    Rx / DC Orders ED Discharge Orders     None         Dione Booze, MD 03/05/22 [phone]

## 2022-03-06 ENCOUNTER — Telehealth: Payer: Self-pay

## 2022-03-06 NOTE — Telephone Encounter (Signed)
        Patient  visited [facility] on 11/2     Telephone encounter attempt :  1st  A HIPAA compliant voice message was left requesting a return call.  Instructed patient to call back    Lenard Forth [facility] Guide, [facility], Care Management  [phone] [address] [place], [place], [place] [address] Phone: [phone] Email: [email or web address]

## 2022-03-10 ENCOUNTER — Encounter (HOSPITAL_COMMUNITY): Payer: Self-pay | Admitting: *Deleted

## 2022-03-10 ENCOUNTER — Other Ambulatory Visit: Payer: Self-pay

## 2022-03-10 ENCOUNTER — Emergency Department (HOSPITAL_COMMUNITY)
Admission: EM | Admit: 2022-03-10 | Discharge: 2022-03-10 | Disposition: A | Discharge status: Home / Self Care | Payer: Medicare Other | Attending: Emergency Medicine | Admitting: Emergency Medicine

## 2022-03-10 DIAGNOSIS — Z7984 Long term (current) use of oral hypoglycemic drugs: Secondary | ICD-10-CM | POA: Diagnosis not present

## 2022-03-10 DIAGNOSIS — E119 Type 2 diabetes mellitus without complications: Secondary | ICD-10-CM | POA: Insufficient documentation

## 2022-03-10 DIAGNOSIS — L989 Disorder of the skin and subcutaneous tissue, unspecified: Secondary | ICD-10-CM | POA: Diagnosis not present

## 2022-03-10 NOTE — ED Provider Notes (Signed)
  [facility] EMERGENCY DEPARTMENT Provider Note   CSN: [id number] Arrival date & time: 03/10/22  2135     History {Add pertinent medical, surgical, social history, OB history to HPI:1} Chief Complaint  Patient presents with   Insect Bite    [name] is a 46 y.o. male.  Patient has a history of hyperlipidemia.  Patient states he ate a sandwich after be within the salience.  He did not eat the bee.  He complains of his tongue feel like a sting or that this occurred 3 days ago   Mouth Lesions      Home Medications Prior to Admission medications   Medication Sig Start Date End Date Taking? Authorizing Provider  atorvastatin (LIPITOR) 40 MG tablet Take 1 tablet (40 mg total) by mouth at bedtime. 01/09/22 02/08/22  Massengill, Harrold Donath, MD  cloZAPine (CLOZARIL) 50 MG tablet Take 3 tablets (150 mg total) by mouth at bedtime for 28 days. 01/09/22 02/06/22  Massengill, Harrold Donath, MD  divalproex (DEPAKOTE) 500 MG DR tablet Take 1 tablet (500 mg total) by mouth every 12 (twelve) hours. 02/26/22 03/28/22  Geoffery Lyons, MD  empagliflozin (JARDIANCE) 25 MG TABS tablet Take 1 tablet (25 mg total) by mouth daily before breakfast. 03/05/22   Dione Booze, MD  haloperidol (HALDOL) 10 MG tablet Take 1 tablet (10 mg total) by mouth every 12 (twelve) hours. 01/09/22 02/08/22  Massengill, Harrold Donath, MD  metFORMIN (GLUCOPHAGE) 1000 MG tablet Take 1 tablet (1,000 mg total) by mouth 2 (two) times daily with a meal. 03/05/22   Dione Booze, MD  temazepam (RESTORIL) 15 MG capsule Take 1 capsule (15 mg total) by mouth at bedtime as needed for up to 14 days for sleep. 01/09/22 01/23/22  Massengill, Harrold Donath, MD  traZODone (DESYREL) 100 MG tablet Take 1 tablet (100 mg total) by mouth at bedtime as needed for sleep. 03/05/22   Dione Booze, MD      Allergies    Other    Review of Systems   Review of Systems  HENT:  Positive for mouth sores.     Physical Exam Updated Vital Signs BP 135/73 (BP Location: Right  Arm)   Pulse (!) 101   Temp 98.4 F (36.9 C) (Oral)   Resp 16   SpO2 99%  Physical Exam  ED Results / Procedures / Treatments   Labs (all labs ordered are listed, but only abnormal results are displayed) Labs Reviewed - No data to display  EKG None  Radiology No results found.  Procedures Procedures  {Document cardiac monitor, telemetry assessment procedure when appropriate:1}  Medications Ordered in ED Medications - No data to display  ED Course/ Medical Decision Making/ A&P                           Medical Decision Making Neuralgia in his tongue.  He will follow-up with ENT if he does not improve  {Document critical care time when appropriate:1} {Document review of labs and clinical decision tools ie heart score, Chads2Vasc2 etc:1}  {Document your independent review of radiology images, and any outside records:1} {Document your discussion with family members, caretakers, and with consultants:1} {Document social determinants of health affecting pt's care:1} {Document your decision making why or why not admission, treatments were needed:1} Final Clinical Impression(s) / ED Diagnoses Final diagnoses:  Skin abnormalities    Rx / DC Orders ED Discharge Orders     None

## 2022-03-10 NOTE — Discharge Instructions (Signed)
You can try some Orajel to numb the area in your mouth.  You will be referred to an ENT doctor if not improving

## 2022-03-10 NOTE — ED Triage Notes (Addendum)
Pt states he was eating a sandwich with a bee on it and pt ate a piece with bee,  pt states the bee stung his left side of tongue. Pt states it occurred 3 days ago. Denies any trouble with swallowing

## 2022-03-15 ENCOUNTER — Telehealth: Payer: Self-pay | Admitting: *Deleted

## 2022-03-15 NOTE — Telephone Encounter (Signed)
     Patient  visit on 03/10/2022  at [facility] ed was for treatment   Patient refused call   Have you been able to follow up with your primary care physician?  The patient was or was not able to obtain any needed medicine or equipment.  Are there diet recommendations that you are having difficulty following?  Patient expresses understanding of discharge instructions and education provided has no other needs at this time.   James Mao Greenauer -[facility], Population Health [phone] [address] [place] , [place] [address] Email : [name]

## 2022-04-08 ENCOUNTER — Emergency Department (HOSPITAL_COMMUNITY)
Admission: EM | Admit: 2022-04-08 | Discharge: 2022-04-09 | Disposition: A | Discharge status: Other Acute Inpatient Hospital | Payer: Medicare Other | Attending: Emergency Medicine | Admitting: Emergency Medicine

## 2022-04-08 ENCOUNTER — Other Ambulatory Visit: Payer: Self-pay

## 2022-04-08 ENCOUNTER — Encounter (HOSPITAL_COMMUNITY): Payer: Self-pay

## 2022-04-08 DIAGNOSIS — R4689 Other symptoms and signs involving appearance and behavior: Secondary | ICD-10-CM | POA: Insufficient documentation

## 2022-04-08 DIAGNOSIS — Z7984 Long term (current) use of oral hypoglycemic drugs: Secondary | ICD-10-CM | POA: Diagnosis not present

## 2022-04-08 DIAGNOSIS — E119 Type 2 diabetes mellitus without complications: Secondary | ICD-10-CM | POA: Insufficient documentation

## 2022-04-08 DIAGNOSIS — F209 Schizophrenia, unspecified: Secondary | ICD-10-CM

## 2022-04-08 DIAGNOSIS — R4789 Other speech disturbances: Secondary | ICD-10-CM | POA: Diagnosis not present

## 2022-04-08 DIAGNOSIS — Z79899 Other long term (current) drug therapy: Secondary | ICD-10-CM | POA: Diagnosis not present

## 2022-04-08 DIAGNOSIS — Z1152 Encounter for screening for COVID-19: Secondary | ICD-10-CM | POA: Diagnosis not present

## 2022-04-08 LAB — COMPREHENSIVE METABOLIC PANEL WITH GFR
ALT: 27 U/L (ref 0–44)
AST: 29 U/L (ref 15–41)
Albumin: 4.3 g/dL (ref 3.5–5.0)
Alkaline Phosphatase: 64 U/L (ref 38–126)
Anion gap: 8 (ref 5–15)
BUN: 12 mg/dL (ref 6–20)
CO2: 26 mmol/L (ref 22–32)
Calcium: 9.2 mg/dL (ref 8.9–10.3)
Chloride: 100 mmol/L (ref 98–111)
Creatinine, Ser: 0.91 mg/dL (ref 0.61–1.24)
GFR, Estimated: >60 mL/min
Glucose, Bld: 97 mg/dL (ref 70–99)
Potassium: 3.3 mmol/L — ABNORMAL LOW (ref 3.5–5.1)
Sodium: 134 mmol/L — ABNORMAL LOW (ref 135–145)
Total Bilirubin: 0.6 mg/dL (ref 0.3–1.2)
Total Protein: 7.9 g/dL (ref 6.5–8.1)

## 2022-04-08 LAB — SALICYLATE LEVEL: Salicylate Lvl: <7 mg/dL — ABNORMAL LOW (ref 7.0–30.0)

## 2022-04-08 LAB — CBC
HCT: 44 % (ref 39.0–52.0)
Hemoglobin: 14.8 g/dL (ref 13.0–17.0)
MCH: 29.5 pg (ref 26.0–34.0)
MCHC: 33.6 g/dL (ref 30.0–36.0)
MCV: 87.6 fL (ref 80.0–100.0)
Platelets: 321 10*3/uL (ref 150–400)
RBC: 5.02 MIL/uL (ref 4.22–5.81)
RDW: 12.9 % (ref 11.5–15.5)
WBC: 10.6 10*3/uL — ABNORMAL HIGH (ref 4.0–10.5)
nRBC: 0 % (ref 0.0–0.2)

## 2022-04-08 LAB — RAPID URINE DRUG SCREEN, HOSP PERFORMED
Amphetamines: NOT DETECTED
Barbiturates: NOT DETECTED
Benzodiazepines: NOT DETECTED
Cocaine: NOT DETECTED
Opiates: NOT DETECTED
Tetrahydrocannabinol: NOT DETECTED

## 2022-04-08 LAB — ACETAMINOPHEN LEVEL: Acetaminophen (Tylenol), Serum: <10 ug/mL — ABNORMAL LOW (ref 10–30)

## 2022-04-08 LAB — ETHANOL: Alcohol, Ethyl (B): <10 mg/dL

## 2022-04-08 MED ORDER — EMPAGLIFLOZIN 25 MG PO TABS
25.0000 mg | ORAL_TABLET | Freq: Every day | ORAL | Status: DC
  Administered 2022-04-09: 25 mg via ORAL
  Filled 2022-04-08 (×2): qty 1

## 2022-04-08 MED ORDER — TRAZODONE HCL 50 MG PO TABS
100.0000 mg | ORAL_TABLET | Freq: Every evening | ORAL | Status: DC | PRN
  Administered 2022-04-08: 100 mg via ORAL
  Filled 2022-04-08: qty 2

## 2022-04-08 MED ORDER — METFORMIN HCL 500 MG PO TABS
1000.0000 mg | ORAL_TABLET | Freq: Two times a day (BID) | ORAL | Status: DC
  Administered 2022-04-09: 1000 mg via ORAL
  Filled 2022-04-08: qty 2

## 2022-04-08 MED ORDER — DIVALPROEX SODIUM 250 MG PO DR TAB
500.0000 mg | DELAYED_RELEASE_TABLET | Freq: Two times a day (BID) | ORAL | Status: DC
  Administered 2022-04-08 – 2022-04-09 (×2): 500 mg via ORAL
  Filled 2022-04-08 (×2): qty 2

## 2022-04-08 MED ORDER — ATORVASTATIN CALCIUM 40 MG PO TABS
40.0000 mg | ORAL_TABLET | Freq: Every day | ORAL | Status: DC
  Administered 2022-04-08: 40 mg via ORAL
  Filled 2022-04-08: qty 1

## 2022-04-08 NOTE — ED Notes (Signed)
Faxed First Exam paperwork to [facility] fax # [phone].

## 2022-04-08 NOTE — BH Assessment (Signed)
@  1000, requested patient's nurse Wilkie Aye, RN) to set up the TTS machine.

## 2022-04-08 NOTE — ED Notes (Signed)
Pt dressed into burgundy scrubs and wanded by security. Pt belongings labeled and put in psych lockers. Pt belongings include shirt, pants, four jackets, tennis shoes, cell phone, rubix cube, and a book bag.

## 2022-04-08 NOTE — ED Triage Notes (Addendum)
Pt presents with [facility]O under an IVC order. The order states pt has not been taking his medication and pt behavior is erratic. The document states pt was walking the streets and yelling at people that were not there. Pt denies auditory or visual hallucinations. Pt in triage with religiosity content of his speech. Per LEO pt was found at home and has been calm and cooperative.

## 2022-04-08 NOTE — ED Notes (Signed)
Faxed Findings and Custody Order and Affidavit and Petition to [employer] @ fax # [phone].
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
## 2022-04-08 NOTE — ED Provider Notes (Signed)
[facility] EMERGENCY DEPARTMENT Provider Note   CSN: [id number] Arrival date & time: 04/08/22  1945     History  Chief Complaint  Patient presents with   Psychiatric Evaluation    [name] is a 46 y.o. male.  Patient presents to the emergency department company by law enforcement due to involuntary commitment paperwork being filed by the patient's mother.  Patient reportedly no longer taking his antipsychotic medications.  Patient denies suicidal ideations, homicidal ideations, hallucinations at this time.  Patient states he had no refills left, but also states he had insufficient funds to purchase his medications.  Patient denies any other medical complaints at this time.  Past medical history significant for paranoid schizophrenia, schizophrenia, bipolar disorder  HPI     Home Medications Prior to Admission medications   Medication Sig Start Date End Date Taking? Authorizing Provider  atorvastatin (LIPITOR) 40 MG tablet Take 1 tablet (40 mg total) by mouth at bedtime. 01/09/22 04/08/22 Yes Massengill, Harrold Donath, MD  empagliflozin (JARDIANCE) 25 MG TABS tablet Take 1 tablet (25 mg total) by mouth daily before breakfast. 03/05/22  Yes Dione Booze, MD  [facility] SUSTENNA 234 MG/1.5ML injection Inject into the muscle.   Yes [provider]  metFORMIN (GLUCOPHAGE) 1000 MG tablet Take 1 tablet (1,000 mg total) by mouth 2 (two) times daily with a meal. 03/05/22  Yes Dione Booze, MD  SYNJARDY XR 01-999 MG TB24 Take by mouth. 03/02/22  Yes [provider]  traZODone (DESYREL) 100 MG tablet Take 1 tablet (100 mg total) by mouth at bedtime as needed for sleep. 03/05/22  Yes Dione Booze, MD  cloZAPine (CLOZARIL) 50 MG tablet Take 3 tablets (150 mg total) by mouth at bedtime for 28 days. Patient not taking: Reported on 04/08/2022 01/09/22 02/06/22  Phineas Inches, MD  divalproex (DEPAKOTE ER) 500 MG 24 hr tablet Take 1,000 mg by mouth at bedtime.    [provider]   divalproex (DEPAKOTE) 500 MG DR tablet Take 1 tablet (500 mg total) by mouth every 12 (twelve) hours. 02/26/22 03/28/22  Geoffery Lyons, MD  haloperidol (HALDOL) 10 MG tablet Take 1 tablet (10 mg total) by mouth every 12 (twelve) hours. Patient not taking: Reported on 04/08/2022 01/09/22 02/08/22  Phineas Inches, MD  temazepam (RESTORIL) 15 MG capsule Take 1 capsule (15 mg total) by mouth at bedtime as needed for up to 14 days for sleep. Patient not taking: Reported on 04/08/2022 01/09/22 01/23/22  Phineas Inches, MD      Allergies    Other    Review of Systems   Review of Systems  Psychiatric/Behavioral:  Negative for hallucinations and suicidal ideas.     Physical Exam Updated Vital Signs BP 133/75 (BP Location: Right Arm)   Pulse 96   Temp 98.2 F (36.8 C) (Oral)   Resp 18   Ht 6\' 1"  (1.854 m)   Wt 117.4 kg   SpO2 98%   BMI 34.15 kg/m  Physical Exam Vitals and nursing note reviewed.  Constitutional:      General: He is not in acute distress.    Appearance: He is well-developed.  HENT:     Head: Normocephalic and atraumatic.  Eyes:     Conjunctiva/sclera: Conjunctivae normal.  Cardiovascular:     Rate and Rhythm: Normal rate and regular rhythm.     Heart sounds: No murmur heard. Pulmonary:     Effort: Pulmonary effort is normal. No respiratory distress.     Breath sounds: Normal breath sounds.  Abdominal:     Palpations: Abdomen is soft.     Tenderness: There is no abdominal tenderness.  Musculoskeletal:        General: No swelling.     Cervical back: Neck supple.  Skin:    General: Skin is warm and dry.     Capillary Refill: Capillary refill takes less than 2 seconds.  Neurological:     Mental Status: He is alert.  Psychiatric:     Comments: Tangential speech     ED Results / Procedures / Treatments   Labs (all labs ordered are listed, but only abnormal results are displayed) Labs Reviewed  COMPREHENSIVE METABOLIC PANEL - Abnormal; Notable for  the following components:      Result Value   Sodium 134 (*)    Potassium 3.3 (*)    All other components within normal limits  SALICYLATE LEVEL - Abnormal; Notable for the following components:   Salicylate Lvl <7.0 (*)    All other components within normal limits  ACETAMINOPHEN LEVEL - Abnormal; Notable for the following components:   Acetaminophen (Tylenol), Serum <10 (*)    All other components within normal limits  CBC - Abnormal; Notable for the following components:   WBC 10.6 (*)    All other components within normal limits  RESP PANEL BY RT-PCR (FLU A&B, COVID) ARPGX2  ETHANOL  RAPID URINE DRUG SCREEN, HOSP PERFORMED    EKG None  Radiology No results found.  Procedures Procedures    Medications Ordered in ED Medications  atorvastatin (LIPITOR) tablet 40 mg (has no administration in time range)  divalproex (DEPAKOTE) DR tablet 500 mg (has no administration in time range)  empagliflozin (JARDIANCE) tablet 25 mg (has no administration in time range)  metFORMIN (GLUCOPHAGE) tablet 1,000 mg (has no administration in time range)  traZODone (DESYREL) tablet 100 mg (has no administration in time range)    ED Course/ Medical Decision Making/ A&P                           Medical Decision Making Amount and/or Complexity of Data Reviewed Labs: ordered.  Risk Prescription drug management.   Patient presents to the hospital due to an involuntary commitment.  Patient denies SI, HI, hallucinations at this time.  Medical clearance labs ordered.  Pertinent results include grossly normal CMP, CBC.  Negative ethanol, acetaminophen, salicylate level.  UDS pending  Patient clear for TTS consult at this time pending results of UDS.        Final Clinical Impression(s) / ED Diagnoses Final diagnoses:  None    Rx / DC Orders ED Discharge Orders     None         Pamala Duffel 04/08/22 2236    Gloris Manchester, MD 04/09/22 [phone]

## 2022-04-09 DIAGNOSIS — R4689 Other symptoms and signs involving appearance and behavior: Secondary | ICD-10-CM | POA: Diagnosis not present

## 2022-04-09 LAB — CBG MONITORING, ED: Glucose-Capillary: 107 mg/dL — ABNORMAL HIGH (ref 70–99)

## 2022-04-09 LAB — RESP PANEL BY RT-PCR (FLU A&B, COVID) ARPGX2
Influenza A by PCR: NEGATIVE
Influenza B by PCR: NEGATIVE
SARS Coronavirus 2 by RT PCR: NEGATIVE

## 2022-04-09 MED ORDER — LORAZEPAM 1 MG PO TABS
1.0000 mg | ORAL_TABLET | Freq: Once | ORAL | Status: AC
  Administered 2022-04-09: 1 mg via ORAL
  Filled 2022-04-09: qty 1

## 2022-04-09 MED ORDER — STERILE WATER FOR INJECTION IJ SOLN
INTRAMUSCULAR | Status: AC
  Administered 2022-04-09: 1.2 mL
  Filled 2022-04-09: qty 10

## 2022-04-09 MED ORDER — ZIPRASIDONE MESYLATE 20 MG IM SOLR
20.0000 mg | Freq: Once | INTRAMUSCULAR | Status: AC
  Administered 2022-04-09: 20 mg via INTRAMUSCULAR
  Filled 2022-04-09: qty 20

## 2022-04-09 MED ORDER — NICOTINE 14 MG/24HR TD PT24
14.0000 mg | MEDICATED_PATCH | Freq: Once | TRANSDERMAL | Status: DC
  Administered 2022-04-09: 14 mg via TRANSDERMAL
  Filled 2022-04-09: qty 1

## 2022-04-09 NOTE — ED Notes (Signed)
Pt in restroom at this time.

## 2022-04-09 NOTE — ED Notes (Signed)
I told pt. He has to stay in his room he did not comply so I requested for security to come back pt. Got upset and stated I was the ugliest bitch and he hates dark skin bitches and is continuing to spurt obscenities.

## 2022-04-09 NOTE — ED Notes (Signed)
Pt talking and laughing out loud to voices he is hearing.

## 2022-04-09 NOTE — ED Notes (Signed)
Pt is in the bathroom talking to himself at this time.

## 2022-04-09 NOTE — ED Notes (Signed)
This RN was in another room and heard pt's sitter calling for security. I came out of the room and saw pt walking out of the department and rambling about different ideas with a loud voice. Security came in contact with pt as he opened the doors to the waiting area but he continued to walk outside the ED doors. Pt was stating, "I have the right to murder you guys. You can't hold me here for that." He was also stating "my parents are trying to make me homeless". [employer] was right outside the exit doors and made contact with him as soon as he exited and pt seemed to befriend the officer. He went right to him and started talking to him. While talking with the officer pt walked with him back to his room with no difficulties.

## 2022-04-09 NOTE — Progress Notes (Signed)
Inpatient Behavioral Health Placement  Pt meets inpatient criteria per Chinwendu Royston Bake, NP. There are no available beds at [facility] per [facility] Fransico Michael, RN  Referral was sent to the following facilities;    Destination  Service Provider Address Phone Fax  [facility]  [address], [place] [address] [phone]  [facility]  [address] [place], [place] [address] [phone]  [facility]  [address] [place] [address] [phone]  [facility]  [address] [place] [address] [phone]  [facility]  [address] [place], [place] [address] [phone]  [facility]  [address] [place] [address] [phone]  [facility]  [address] [place] [address] [phone]  [facility]  [address], [place] [address] [phone]  [facility]  [address] [place] [address] [phone]  [facility]  [address] [place] [address] [phone]  [facility]  [address] [place] [address] [phone]  [facility]  [address], [place] [address] [phone]  [facility]  [address], [place] [address] [phone]  [facility]  [address] [place] [address] [phone]  [facility]  [address] [place] [address] [phone]   Situation ongoing,  CSW will follow up.   Maryjean Ka, MSW, LCSWA 04/09/2022  @ 7:43 AM

## 2022-04-09 NOTE — ED Notes (Signed)
Pt speaking gibberish / tongues at this time saying that the people from [employer] in the sky is talking to him.

## 2022-04-09 NOTE — ED Notes (Signed)
IVC paperwork faxed to [phone] [facility]

## 2022-04-09 NOTE — Progress Notes (Signed)
Pt was accepted to [facility] TODAY 04/09/2022.   Pt meets inpatient criteria per Alan Mulder, NP  Attending Physician will be Luberta Mutter, MD  Report can be called to: [phone]  Bed is ready now   Care Team Notified: Fransico Him, RN, Alan Mulder, NP, and [address] LCSWA  [place], [place]  04/09/2022 1:05 PM

## 2022-04-09 NOTE — ED Notes (Signed)
Pt in bathroom at this time.

## 2022-04-09 NOTE — ED Provider Notes (Addendum)
Emergency Medicine Observation Re-evaluation Note  [name] is a 46 y.o. male, seen on rounds today.  Pt initially presented to the ED for complaints of Psychiatric Evaluation Currently, the patient is awake, alert, ambulatory.  Physical Exam  BP 122/79 (BP Location: Left Arm)   Pulse 82   Temp 97.6 F (36.4 C) (Oral)   Resp 18   Ht 6\' 1"  (1.854 m)   Wt 117.4 kg   SpO2 99%   BMI 34.15 kg/m  Physical Exam General: No distress Cardiac: Regular rate and rhythm Lungs: No increased work of breathing Psych: Withdrawn  ED Course / MDM  EKG:   I have reviewed the labs performed to date as well as medications administered while in observation.  Recent changes in the last 24 hours include no noted changes.  Plan  Current plan is for placement.    , MD 04/09/22 [phone]  12:07 PM Patient uncooperative, attempting to leave the building.  He had 1 prior similar attempt earlier this morning, was directed to that point, but now requires assistance of security.    [id number], MD 04/09/22 1208

## 2022-04-09 NOTE — ED Provider Notes (Signed)
Patient accepted to [facility], Dr. Renato Gails accepts    James Munch, MD 04/09/22 [phone]

## 2022-04-09 NOTE — ED Notes (Addendum)
Pt walked out of facility once again and was met by the [employer] officer upon exiting. [employer] officer was able to talk him into coming back to this room. Pt did report that he's here because he "hasn't had my shot". He reports his doctor comes out to his house where he recently lived with his father and normally gives him a shot of Abilify, but this last time they didn't. He is unsure of why they did not administer the shot and feels that's the reason he is acting as he is. Pt is requesting to get his shot of Abilify so he can "go home". Dr. Vanita Panda gave order to administer Geodon 59m IM. Medication given to pt without difficulty.

## 2022-04-30 DIAGNOSIS — Z87891 Personal history of nicotine dependence: Secondary | ICD-10-CM | POA: Diagnosis not present

## 2022-04-30 DIAGNOSIS — F17210 Nicotine dependence, cigarettes, uncomplicated: Secondary | ICD-10-CM | POA: Diagnosis not present

## 2022-04-30 DIAGNOSIS — Z0000 Encounter for general adult medical examination without abnormal findings: Secondary | ICD-10-CM | POA: Diagnosis not present

## 2022-04-30 DIAGNOSIS — Z139 Encounter for screening, unspecified: Secondary | ICD-10-CM | POA: Diagnosis not present

## 2022-04-30 DIAGNOSIS — Z5900 Homelessness unspecified: Secondary | ICD-10-CM | POA: Diagnosis not present

## 2022-05-07 ENCOUNTER — Emergency Department (HOSPITAL_COMMUNITY)
Admission: EM | Admit: 2022-05-07 | Discharge: 2022-05-07 | Disposition: A | Discharge status: Home / Self Care | Payer: 59 | Attending: Student | Admitting: Student

## 2022-05-07 ENCOUNTER — Encounter (HOSPITAL_COMMUNITY): Payer: Self-pay | Admitting: Emergency Medicine

## 2022-05-07 ENCOUNTER — Other Ambulatory Visit: Payer: Self-pay

## 2022-05-07 DIAGNOSIS — I10 Essential (primary) hypertension: Secondary | ICD-10-CM | POA: Insufficient documentation

## 2022-05-07 DIAGNOSIS — F209 Schizophrenia, unspecified: Secondary | ICD-10-CM | POA: Diagnosis not present

## 2022-05-07 DIAGNOSIS — R5381 Other malaise: Secondary | ICD-10-CM | POA: Diagnosis not present

## 2022-05-07 DIAGNOSIS — E119 Type 2 diabetes mellitus without complications: Secondary | ICD-10-CM | POA: Diagnosis not present

## 2022-05-07 DIAGNOSIS — F17210 Nicotine dependence, cigarettes, uncomplicated: Secondary | ICD-10-CM | POA: Diagnosis not present

## 2022-05-07 DIAGNOSIS — Z7984 Long term (current) use of oral hypoglycemic drugs: Secondary | ICD-10-CM | POA: Diagnosis not present

## 2022-05-07 DIAGNOSIS — F22 Delusional disorders: Secondary | ICD-10-CM | POA: Diagnosis present

## 2022-05-07 DIAGNOSIS — Z79899 Other long term (current) drug therapy: Secondary | ICD-10-CM | POA: Diagnosis not present

## 2022-05-07 DIAGNOSIS — Z743 Need for continuous supervision: Secondary | ICD-10-CM | POA: Diagnosis not present

## 2022-05-07 DIAGNOSIS — R531 Weakness: Secondary | ICD-10-CM | POA: Diagnosis not present

## 2022-05-07 MED ORDER — DIVALPROEX SODIUM ER 500 MG PO TB24: 1000.0000 mg | ORAL_TABLET | Freq: Every day | ORAL | Status: DC

## 2022-05-07 MED ORDER — EMPAGLIFLOZIN 25 MG PO TABS
25.0000 mg | ORAL_TABLET | Freq: Every day | ORAL | Status: DC
  Administered 2022-05-07: 25 mg via ORAL
  Filled 2022-05-07 (×2): qty 1

## 2022-05-07 MED ORDER — EMPAGLIFLOZIN 25 MG PO TABS
25.0000 mg | ORAL_TABLET | Freq: Every day | ORAL | Status: DC
  Filled 2022-05-07: qty 1

## 2022-05-07 MED ORDER — DIVALPROEX SODIUM ER 500 MG PO TB24
1000.0000 mg | ORAL_TABLET | Freq: Every day | ORAL | Status: DC
  Administered 2022-05-07: 1000 mg via ORAL
  Filled 2022-05-07: qty 2
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
## 2022-05-07 NOTE — ED Notes (Signed)
Pt given food tray.
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
## 2022-05-09 ENCOUNTER — Emergency Department (HOSPITAL_COMMUNITY)
Admission: EM | Admit: 2022-05-09 | Discharge: 2022-05-09 | Disposition: A | Discharge status: Home / Self Care | Payer: 59 | Attending: Emergency Medicine | Admitting: Emergency Medicine

## 2022-05-09 ENCOUNTER — Encounter (HOSPITAL_COMMUNITY): Payer: Self-pay | Admitting: Emergency Medicine

## 2022-05-09 ENCOUNTER — Other Ambulatory Visit: Payer: Self-pay

## 2022-05-09 DIAGNOSIS — Z7984 Long term (current) use of oral hypoglycemic drugs: Secondary | ICD-10-CM | POA: Diagnosis not present

## 2022-05-09 DIAGNOSIS — Z5900 Homelessness unspecified: Secondary | ICD-10-CM

## 2022-05-09 DIAGNOSIS — E119 Type 2 diabetes mellitus without complications: Secondary | ICD-10-CM | POA: Insufficient documentation

## 2022-05-09 NOTE — ED Triage Notes (Signed)
Pt states he is here tonight due to being homeless for the past 4 days and he is tired of walking the streets every night.

## 2022-05-09 NOTE — Discharge Instructions (Signed)
Continue medications as previously prescribed.  Follow-up with primary doctor.

## 2022-05-09 NOTE — ED Provider Notes (Signed)
[facility] EMERGENCY DEPARTMENT Provider Note   CSN: [id number] Arrival date & time: 05/09/22  0316     History  Chief Complaint  Patient presents with   Homeless    [name] is a 47 y.o. male.  Patient is a 47 year old male with past medical history of schizophrenia, diabetes.  Patient well-known to the emergency department for frequent visits regarding psychiatric reasons and homelessness.  Patient presenting tonight with complaints of homelessness.  He apparently had some sort of disagreement with his mother 4 days ago.  He was concerned that she would involuntarily commit him to the hospital, so he left and has been "walking the streets" for the past 4 days.  He presents tonight complaining of having no place to go.  He denies to me that he is suicidal or homicidal.  The history is provided by the patient.       Home Medications Prior to Admission medications   Medication Sig Start Date End Date Taking? Authorizing Provider  atorvastatin (LIPITOR) 40 MG tablet Take 1 tablet (40 mg total) by mouth at bedtime. 01/09/22 04/08/22  Massengill, Ovid Curd, MD  cloZAPine (CLOZARIL) 50 MG tablet Take 3 tablets (150 mg total) by mouth at bedtime for 28 days. Patient not taking: Reported on 04/08/2022 01/09/22 02/06/22  Janine Limbo, MD  divalproex (DEPAKOTE ER) 500 MG 24 hr tablet Take 1,000 mg by mouth at bedtime.    [provider]  divalproex (DEPAKOTE) 500 MG DR tablet Take 1 tablet (500 mg total) by mouth every 12 (twelve) hours. Patient taking differently: Take 1,000 mg by mouth at bedtime. 02/26/22 04/09/22  Veryl Speak, MD  empagliflozin (JARDIANCE) 25 MG TABS tablet Take 1 tablet (25 mg total) by mouth daily before breakfast. 29/5/28   Delora Fuel, MD  haloperidol (HALDOL) 10 MG tablet Take 1 tablet (10 mg total) by mouth every 12 (twelve) hours. 01/09/22 04/09/22  Massengill, Ovid Curd, MD  INVEGA SUSTENNA 234 MG/1.5ML injection Inject into the muscle.     [provider]  metFORMIN (GLUCOPHAGE) 1000 MG tablet Take 1 tablet (1,000 mg total) by mouth 2 (two) times daily with a meal. 41/3/24   Delora Fuel, MD  SYNJARDY XR 01-999 MG TB24 Take by mouth. 03/02/22   [provider]  temazepam (RESTORIL) 15 MG capsule Take 1 capsule (15 mg total) by mouth at bedtime as needed for up to 14 days for sleep. Patient not taking: Reported on 04/08/2022 01/09/22 01/23/22  Janine Limbo, MD  traZODone (DESYREL) 100 MG tablet Take 1 tablet (100 mg total) by mouth at bedtime as needed for sleep. 40/1/02   Delora Fuel, MD      Allergies    Other    Review of Systems   Review of Systems  All other systems reviewed and are negative.   Physical Exam Updated Vital Signs BP 122/71 (BP Location: Right Arm)   Pulse 93   Temp 98.1 F (36.7 C) (Oral)   Resp 16   Ht 6\' 1"  (1.854 m)   Wt 117 kg   SpO2 98%   BMI 34.03 kg/m  Physical Exam Vitals and nursing note reviewed.  Constitutional:      General: He is not in acute distress.    Appearance: He is well-developed. He is not diaphoretic.  HENT:     Head: Normocephalic and atraumatic.  Cardiovascular:     Rate and Rhythm: Normal rate and regular rhythm.     Heart sounds: No murmur heard.  No friction rub.  Pulmonary:     Effort: Pulmonary effort is normal. No respiratory distress.     Breath sounds: Normal breath sounds. No wheezing or rales.  Abdominal:     General: Bowel sounds are normal. There is no distension.     Palpations: Abdomen is soft.     Tenderness: There is no abdominal tenderness.  Musculoskeletal:        General: Normal range of motion.     Cervical back: Normal range of motion and neck supple.  Skin:    General: Skin is warm and dry.  Neurological:     Mental Status: He is alert and oriented to person, place, and time.     Coordination: Coordination normal.  Psychiatric:        Attention and Perception: Attention normal.        Mood and Affect:  Affect is flat.        Speech: Speech normal.        Behavior: Behavior is withdrawn.        Thought Content: Thought content does not include homicidal or suicidal ideation. Thought content does not include homicidal or suicidal plan.        Judgment: Judgment is impulsive.     ED Results / Procedures / Treatments   Labs (all labs ordered are listed, but only abnormal results are displayed) Labs Reviewed - No data to display  EKG None  Radiology No results found.  Procedures Procedures    Medications Ordered in ED Medications - No data to display  ED Course/ Medical Decision Making/ A&P  Patient with history of schizophrenia and homelessness presenting with complaints of homelessness.  He apparently had a dispute with his mother and has been walking the streets for the past 4 days.  Patient has no specific complaints to me, just that he wants to come inside from the cold.  I have seen this patient on many occasions in the past and he seems to be at his baseline.  He does not appear to be responding to internal stimuli or frankly psychotic.  We did attempt to reach his mother, however there was no answer.  Patient now requesting to go home and will be discharged.  Final Clinical Impression(s) / ED Diagnoses Final diagnoses:  None    Rx / DC Orders ED Discharge Orders     None         Veryl Speak, MD 05/09/22 [phone]

## 2022-05-22 ENCOUNTER — Other Ambulatory Visit: Payer: Self-pay

## 2022-05-22 ENCOUNTER — Emergency Department (EMERGENCY_DEPARTMENT_HOSPITAL)
Admission: EM | Admit: 2022-05-22 | Discharge: 2022-05-24 | Disposition: A | Discharge status: Home / Self Care | Payer: 59 | Source: Home / Self Care | Attending: Emergency Medicine | Admitting: Emergency Medicine

## 2022-05-22 ENCOUNTER — Encounter (HOSPITAL_COMMUNITY): Payer: Self-pay | Admitting: *Deleted

## 2022-05-22 DIAGNOSIS — Z20822 Contact with and (suspected) exposure to covid-19: Secondary | ICD-10-CM | POA: Insufficient documentation

## 2022-05-22 DIAGNOSIS — F200 Paranoid schizophrenia: Secondary | ICD-10-CM | POA: Diagnosis present

## 2022-05-22 DIAGNOSIS — F29 Unspecified psychosis not due to a substance or known physiological condition: Secondary | ICD-10-CM | POA: Insufficient documentation

## 2022-05-22 DIAGNOSIS — F69 Unspecified disorder of adult personality and behavior: Secondary | ICD-10-CM

## 2022-05-22 DIAGNOSIS — R9431 Abnormal electrocardiogram [ECG] [EKG]: Secondary | ICD-10-CM | POA: Diagnosis not present

## 2022-05-22 HISTORY — DX: Type 2 diabetes mellitus without complications: E11.9

## 2022-05-22 LAB — CBC
HCT: 41.6 % (ref 39.0–52.0)
Hemoglobin: 14.1 g/dL (ref 13.0–17.0)
MCH: 29.9 pg (ref 26.0–34.0)
MCHC: 33.9 g/dL (ref 30.0–36.0)
MCV: 88.3 fL (ref 80.0–100.0)
Platelets: 349 10*3/uL (ref 150–400)
RBC: 4.71 MIL/uL (ref 4.22–5.81)
RDW: 12.4 % (ref 11.5–15.5)
WBC: 11.3 10*3/uL — ABNORMAL HIGH (ref 4.0–10.5)
nRBC: 0 % (ref 0.0–0.2)

## 2022-05-22 LAB — COMPREHENSIVE METABOLIC PANEL
ALT: 24 U/L (ref 0–44)
AST: 23 U/L (ref 15–41)
Albumin: 4.1 g/dL (ref 3.5–5.0)
Alkaline Phosphatase: 59 U/L (ref 38–126)
Anion gap: 9 (ref 5–15)
BUN: 16 mg/dL (ref 6–20)
CO2: 28 mmol/L (ref 22–32)
Calcium: 9.5 mg/dL (ref 8.9–10.3)
Chloride: 102 mmol/L (ref 98–111)
Creatinine, Ser: 0.88 mg/dL (ref 0.61–1.24)
GFR, Estimated: >60 mL/min (ref >60)
Glucose, Bld: 100 mg/dL — ABNORMAL HIGH (ref 70–99)
Potassium: 4 mmol/L (ref 3.5–5.1)
Sodium: 139 mmol/L (ref 135–145)
Total Bilirubin: 0.5 mg/dL (ref 0.3–1.2)
Total Protein: 7.7 g/dL (ref 6.5–8.1)

## 2022-05-22 LAB — RAPID URINE DRUG SCREEN, HOSP PERFORMED
Amphetamines: NOT DETECTED
Barbiturates: NOT DETECTED
Benzodiazepines: NOT DETECTED
Cocaine: NOT DETECTED
Opiates: NOT DETECTED
Tetrahydrocannabinol: NOT DETECTED

## 2022-05-22 LAB — SALICYLATE LEVEL: Salicylate Lvl: <7 mg/dL — ABNORMAL LOW (ref 7.0–30.0)

## 2022-05-22 LAB — RESP PANEL BY RT-PCR (RSV, FLU A&B, COVID)  RVPGX2
Influenza A by PCR: NEGATIVE
Influenza B by PCR: NEGATIVE
Resp Syncytial Virus by PCR: NEGATIVE
SARS Coronavirus 2 by RT PCR: NEGATIVE

## 2022-05-22 LAB — ETHANOL: Alcohol, Ethyl (B): <10 mg/dL (ref <10)

## 2022-05-22 LAB — ACETAMINOPHEN LEVEL: Acetaminophen (Tylenol), Serum: <10 ug/mL — ABNORMAL LOW (ref 10–30)

## 2022-05-22 MED ORDER — DIVALPROEX SODIUM 250 MG PO DR TAB
500.0000 mg | DELAYED_RELEASE_TABLET | Freq: Two times a day (BID) | ORAL | Status: DC
  Administered 2022-05-22 – 2022-05-24 (×4): 500 mg via ORAL
  Filled 2022-05-22 (×4): qty 2

## 2022-05-22 MED ORDER — HALOPERIDOL 5 MG PO TABS
10.0000 mg | ORAL_TABLET | Freq: Two times a day (BID) | ORAL | Status: DC
  Administered 2022-05-22 – 2022-05-24 (×4): 10 mg via ORAL
  Filled 2022-05-22 (×4): qty 2

## 2022-05-22 MED ORDER — METFORMIN HCL 500 MG PO TABS
1000.0000 mg | ORAL_TABLET | Freq: Two times a day (BID) | ORAL | Status: DC
  Administered 2022-05-23 – 2022-05-24 (×3): 1000 mg via ORAL
  Filled 2022-05-22 (×3): qty 2
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
## 2022-05-22 NOTE — ED Notes (Signed)
Tele psych machine to bedside  

## 2022-05-22 NOTE — ED Notes (Signed)
Faxed IVC paperwork to [place].

## 2022-05-22 NOTE — ED Notes (Signed)
Pt belongings locked up in locker 9. 1 bag of clothes and shoes, and another bag of medicine, sponge. 2 Phones and jewelry given to security.
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
## 2022-05-22 NOTE — ED Notes (Signed)
Security to bedside to wand pt

## 2022-05-22 NOTE — ED Provider Notes (Signed)
[facility] Provider Note   CSN: [id number] Arrival date & time: 05/22/22  1552     History  Chief Complaint  Patient presents with   Medical Clearance    IVC    [name] is a 47 y.o. male.  HPI Patient presents under IVC for bizarre behavior.  Medical history includes schizophrenia, HLD, bipolar disorder.  He is well-known to the emergency department.  He has recently been living with his mother.  Although his mother reports that he has stopped taking his psychiatric medications, patient denies this.  Patient's mother filed IVC paperwork due to patient's increasingly bizarre behavior.  This includes damaging property at the home.  Patient denies these things.  He denies any SI, HI, or AVH.  He states that he feels well physically, but is hungry.      Home Medications Prior to Admission medications   Medication Sig Start Date End Date Taking? Authorizing Provider  cloZAPine (CLOZARIL) 50 MG tablet Take 3 tablets (150 mg total) by mouth at bedtime for 28 days. 01/09/22 05/22/22 Yes Massengill, Ovid Curd, MD  divalproex (DEPAKOTE) 500 MG DR tablet Take 1 tablet (500 mg total) by mouth every 12 (twelve) hours. Patient taking differently: Take 1,000 mg by mouth at bedtime. 02/26/22 05/22/22 Yes Delo, Nathaneil Canary, MD  INVEGA SUSTENNA 234 MG/1.5ML injection Inject into the muscle.   Yes [provider]  atorvastatin (LIPITOR) 40 MG tablet Take 1 tablet (40 mg total) by mouth at bedtime. Patient not taking: Reported on 05/22/2022 01/09/22 04/08/22  Janine Limbo, MD  empagliflozin (JARDIANCE) 25 MG TABS tablet Take 1 tablet (25 mg total) by mouth daily before breakfast. Patient not taking: Reported on 0/86/5784 69/6/29   Delora Fuel, MD  haloperidol (HALDOL) 10 MG tablet Take 1 tablet (10 mg total) by mouth every 12 (twelve) hours. Patient not taking: Reported on 05/22/2022 01/09/22 04/09/22  Janine Limbo, MD  metFORMIN  (GLUCOPHAGE) 1000 MG tablet Take 1 tablet (1,000 mg total) by mouth 2 (two) times daily with a meal. Patient not taking: Reported on 09/24/4130 44/0/10   Delora Fuel, MD  SYNJARDY XR 01-999 MG TB24 Take by mouth. Patient not taking: Reported on 05/22/2022 03/02/22   [provider]  temazepam (RESTORIL) 15 MG capsule Take 1 capsule (15 mg total) by mouth at bedtime as needed for up to 14 days for sleep. Patient not taking: Reported on 04/08/2022 01/09/22 01/23/22  Janine Limbo, MD  traZODone (DESYREL) 100 MG tablet Take 1 tablet (100 mg total) by mouth at bedtime as needed for sleep. Patient not taking: Reported on 2/72/5366 44/0/34   Delora Fuel, MD      Allergies    Other    Review of Systems   Review of Systems  Psychiatric/Behavioral:  Positive for behavioral problems.   All other systems reviewed and are negative.   Physical Exam Updated Vital Signs BP (!) 147/104   Pulse 97   Temp 98.6 F (37 C) (Oral)   Resp 16   SpO2 98%  Physical Exam Vitals and nursing note reviewed.  Constitutional:      General: He is not in acute distress.    Appearance: Normal appearance. He is well-developed. He is not ill-appearing, toxic-appearing or diaphoretic.  HENT:     Head: Normocephalic and atraumatic.     Right Ear: External ear normal.     Left Ear: External ear normal.     Nose: Nose normal.  Mouth/Throat:     Mouth: Mucous membranes are moist.  Eyes:     Extraocular Movements: Extraocular movements intact.     Conjunctiva/sclera: Conjunctivae normal.  Cardiovascular:     Rate and Rhythm: Normal rate and regular rhythm.  Pulmonary:     Effort: Pulmonary effort is normal. No respiratory distress.  Abdominal:     General: There is no distension.     Palpations: Abdomen is soft.  Musculoskeletal:        General: No swelling. Normal range of motion.     Cervical back: Normal range of motion and neck supple.  Skin:    General: Skin is warm and dry.      Coloration: Skin is not jaundiced or pale.  Neurological:     General: No focal deficit present.     Mental Status: He is alert and oriented to person, place, and time.     Cranial Nerves: No cranial nerve deficit.     Sensory: No sensory deficit.     Motor: No weakness.     Coordination: Coordination normal.  Psychiatric:        Attention and Perception: Perception normal.        Mood and Affect: Mood and affect normal.        Speech: Speech normal.        Behavior: Behavior normal. Behavior is cooperative.        Thought Content: Thought content normal.     ED Results / Procedures / Treatments   Labs (all labs ordered are listed, but only abnormal results are displayed) Labs Reviewed  COMPREHENSIVE METABOLIC PANEL - Abnormal; Notable for the following components:      Result Value   Glucose, Bld 100 (*)    All other components within normal limits  SALICYLATE LEVEL - Abnormal; Notable for the following components:   Salicylate Lvl <7.0 (*)    All other components within normal limits  ACETAMINOPHEN LEVEL - Abnormal; Notable for the following components:   Acetaminophen (Tylenol), Serum <10 (*)    All other components within normal limits  CBC - Abnormal; Notable for the following components:   WBC 11.3 (*)    All other components within normal limits  RESP PANEL BY RT-PCR (RSV, FLU A&B, COVID)  RVPGX2  ETHANOL  RAPID URINE DRUG SCREEN, HOSP PERFORMED    EKG EKG Interpretation  Date/Time:  Wednesday May 22 2022 17:12:32 EST Ventricular Rate:  77 PR Interval:  146 QRS Duration: 84 QT Interval:  348 QTC Calculation: 393 R Axis:   74 Text Interpretation: Normal sinus rhythm Confirmed by Gloris Manchester ([id number]) on 05/22/2022 7:07:29 PM  Radiology No results found.  Procedures Procedures    Medications Ordered in ED Medications  divalproex (DEPAKOTE) DR tablet 500 mg (has no administration in time range)  metFORMIN (GLUCOPHAGE) tablet 1,000 mg (has no  administration in time range)  haloperidol (HALDOL) tablet 10 mg (has no administration in time range)    ED Course/ Medical Decision Making/ A&P                             Medical Decision Making Amount and/or Complexity of Data Reviewed Labs: ordered.   This patient presents to the ED for concern of behavioral concerns, this involves an extensive number of treatment options, and is a complaint that carries with it a high risk of complications and morbidity.  The differential diagnosis includes compensated schizophrenia, medication  nonadherence   Co morbidities that complicate the patient evaluation  schizophrenia, HLD, bipolar disorder   Additional history obtained:  Additional history obtained from N/A External records from outside source obtained and reviewed including EMR, IVC paperwork   Lab Tests:  I Ordered, and personally interpreted labs.  The pertinent results include: Slight leukocytosis with otherwise normal results  Cardiac Monitoring: / EKG:  The patient was maintained on a cardiac monitor.  I personally viewed and interpreted the cardiac monitored which showed an underlying rhythm of: Sinus rhythm   Consultations Obtained:  I requested consultation with the TTS,  and discussed lab and imaging findings as well as pertinent plan - they recommend: (Recommendations pending)   Problem List / ED Course / Critical interventions / Medication management  Patient presents under IVC, followed by his mother, who he lives with, for increasingly bizarre behavior at home.  His mother reports that he is stopped taking his psychiatric medications.  He has been damaging property with behaviors such as putting electronics and toilet, pouring Coke in the heater.  Patient denies all of these things.  He denies any physical symptoms.  He denies any SI, HI, or AVH.  He is well-appearing on exam.  Given his mother's concerns, patient to undergo basic medical clearance workup and  TTS evaluation.  First exam paperwork completed.  Lab work was ordered.  Diet was ordered.  Patient's lab work leukocytosis but is otherwise unremarkable.  Patient remained cooperative and appropriate while in the ED.  Given the concerns of his mother, DCS was consulted.  Patient to remain in ED awaiting TTS evaluation.  Home medications were ordered.         Final Clinical Impression(s) / ED Diagnoses Final diagnoses:  Behavior concern in adult    Rx / DC Orders ED Discharge Orders     None         Godfrey Pick, MD 05/22/22 Kathyrn Drown

## 2022-05-22 NOTE — BH Assessment (Incomplete)
Comprehensive Clinical Assessment (CCA) Note  05/22/2022 [name] [id number]  Chief Complaint:  Chief Complaint  Patient presents with  . Medical Clearance    IVC   Visit Diagnosis: ***    CCA Screening, Triage and Referral (STR)  Patient Reported Information How did you hear about [place]? Legal System  What Is the Reason for Your Visit/Call Today? IVC due to bizarre behaviors.  How Long Has This Been Causing You Problems? 1 wk - 1 month  What Do You Feel Would Help You the Most Today? -- ("I don't know")   Have You Recently Had Any Thoughts About Hurting Yourself? No  Are You Planning to Commit Suicide/Harm Yourself At This time? No   Flowsheet Row ED from 05/22/2022 in [facility] Emergency Department at [facility] ED from 05/09/2022 in [facility] Emergency Department at [facility] ED from 05/07/2022 in [facility] Emergency Department at [place] No Risk No Risk No Risk       Have you Recently Had Thoughts About [place]? No  Are You Planning to Harm Someone at This Time? No  Explanation: n/a   Have You Used Any Alcohol or Drugs in the Past 24 Hours? No  What Did You Use and How Much? n/a   Do You Currently Have a Therapist/Psychiatrist? -- (unable to assess due to mental status)  Name of Therapist/Psychiatrist: Name of Therapist/Psychiatrist: unable to assess due to mental status   Have You Been Recently Discharged From Any Office Practice or Programs? No  Explanation of Discharge From Practice/Program: n/a     CCA Screening Triage Referral Assessment Type of Contact: Tele-Assessment  Telemedicine Service Delivery:   Is this Initial or Reassessment? Is this Initial or Reassessment?: Initial Assessment  Date Telepsych consult ordered in [facility]:  Date Telepsych consult ordered in [facility]: 05/22/22  Time Telepsych consult ordered in [facility]:  Time Telepsych consult ordered in [facility]: 1703  Location  of Assessment: [facility]  Provider Location: [facility] Services   Collateral Involvement: mother/legal guardian Hoyle Sauer [phone]   Does Patient Have a Court Appointed Legal Guardian? Yes Mother  Legal Guardian Contact Information: mother/legal guardian Hoyle Sauer [phone]  Copy of Legal Guardianship Form: -- (unknown)  Legal Guardian Notified of Arrival: Attempted notification unsuccessful  Legal Guardian Notified of Pending Discharge: -- (recommended of observation)  If Minor and Not Living with Parent(s), Who has Custody? n/a  Is CPS involved or ever been involved? Never  Is APS involved or ever been involved? Never   Patient Determined To Be At Risk for Harm To Self or Others Based on Review of Patient Reported Information or Presenting Complaint? Yes, for Self-Harm  Method: No Plan  Availability of Means: No access or NA  Intent: Vague intent or NA  Notification Required: No need or identified person  Additional Information for Danger to Others Potential: -- (none reported by patient)  Additional Comments for Danger to Others Potential: n/a  Are There Guns or Other Weapons in Your Home? No  Types of Guns/Weapons: n/a  Are These Weapons Safely Secured?                            -- (n/a)  Who Could Verify You Are Able To Have These Secured: n/a  Do You Have any Outstanding Charges, Pending Court Dates, Parole/Probation? none reported  Contacted To Inform of Risk  of Harm To Self or Others: Guardian/MH POA: (attempted contact)    Does Patient Present under Involuntary Commitment? Yes    [place] of Residence: [place]   Patient Currently Receiving the Following Services: Not Receiving Services   Determination of Need: Urgent (48 hours)   Options For Referral: Medication Management; Outpatient Therapy; Other: Comment; [facility]; Inpatient Hospitalization (Observation)     CCA Biopsychosocial Patient Reported  Schizophrenia/Schizoaffective Diagnosis in Past: Yes   Strengths: Pt is pleasant and talks openly about his concerns   Mental Health Symptoms Depression:   None   Duration of Depressive symptoms:  Duration of Depressive Symptoms: N/A   Mania:   None   Anxiety:    Tension; Restlessness   Psychosis:   None   Duration of Psychotic symptoms:  Duration of Psychotic Symptoms: -- (patient denied)   Trauma:   -- (unable to assess due to mental status)   Obsessions:   None   Compulsions:   None   Inattention:   N/A   Hyperactivity/Impulsivity:   N/A   Oppositional/Defiant Behaviors:   N/A   Emotional Irregularity:   Transient, stress-related paranoia/disassociation   Other Mood/Personality Symptoms:   unable to assess due to mental status    Mental Status Exam Appearance and self-care  Stature:   Average   Weight:   Average weight   Clothing:   Age-appropriate   Grooming:   Normal   Cosmetic use:   None   Posture/gait:   Normal   Motor activity:   Not Remarkable   Sensorium  Attention:   Distractible   Concentration:   Scattered   Orientation:   Person; Place   Recall/memory:   Defective in Short-term; Defective in Recent   Affect and Mood  Affect:   Appropriate; Anxious   Mood:   Irritable; Anxious   Relating  Eye contact:   None   Facial expression:   Anxious; Responsive   Attitude toward examiner:   Cooperative   Thought and Language  Speech flow:  Flight of Ideas   Thought content:   Delusions   Preoccupation:   None   Hallucinations:   None   Organization:   Disorganized; Irrelevant; Loose   Transport planner of Knowledge:   Average   Intelligence:   Needs investigation   Abstraction:   Abstract   Judgement:   Impaired   Reality Testing:   Distorted   Insight:   Lacking   Decision Making:   Impulsive; Vacilates   Social Functioning  Social Maturity:   Impulsive   Social  Judgement:   Heedless   Stress  Stressors:   Relationship; Financial   Coping Ability:   Overwhelmed   Skill Deficits:   Interpersonal; Decision making; Communication; Self-control; Responsibility   Supports:   Family; Friends/Service system     Religion: Religion/Spirituality Are You A Religious Person?: Yes What is Your Religious Affiliation?: Christian How Might This Affect Treatment?: unable to assess due to mental status  Leisure/Recreation: Leisure / Recreation Do You Have Hobbies?: Yes Leisure and Hobbies: "everything"  Exercise/Diet: Exercise/Diet Do You Exercise?: No Have You Gained or Lost A Significant Amount of Weight in the Past Six Months?: No Do You Follow a Special Diet?: No Do You Have Any Trouble Sleeping?: Yes Explanation of Sleeping Difficulties: 4-5 hours nightly   CCA Employment/Education Employment/Work Situation: Employment / Work Situation Employment Situation: On disability Why is Patient on Disability: Schizophrenia  How Long has Patient Been on Disability:  Per chart, since 1998 Patient's Job has Been Impacted by Current Illness: No Has Patient ever Been in the [employer]?: No  Education: Education Is Patient Currently Attending School?: No School Currently Attending: n/a Last Grade Completed: 12 (UTA) Did You Attend College?: No Did You Have An Individualized Education Program (IIEP): No Did You Have Any Difficulty At School?: No Patient's Education Has Been Impacted by Current Illness: No   CCA Family/Childhood History Family and Relationship History: Family history Marital status: Single Does patient have children?: No  Childhood History:  Childhood History By whom was/is the patient raised?: Both parents Did patient suffer any verbal/emotional/physical/sexual abuse as a child?: No Did patient suffer from severe childhood neglect?: No Has patient ever been sexually abused/assaulted/raped as an adolescent or adult?:  No Was the patient ever a victim of a crime or a disaster?: No Witnessed domestic violence?: No Has patient been affected by domestic violence as an adult?: No       CCA Substance Use Alcohol/Drug Use: Alcohol / Drug Use Pain Medications: See MAR Prescriptions: See MAR Over the Counter: See MAR History of alcohol / drug use?: No history of alcohol / drug abuse Longest period of sobriety (when/how long): Patient states that he has been free of alcohol and dugs for the past 6-7 years.  UDS and BAL are negative. Negative Consequences of Use:  (UTA, though pt's UDA was negative and his BAL was N/A) Withdrawal Symptoms: None                         ASAM's:  Six Dimensions of Multidimensional Assessment  Dimension 1:  Acute Intoxication and/or Withdrawal Potential:   Dimension 1:  Description of individual's past and current experiences of substance use and withdrawal: n/a  Dimension 2:  Biomedical Conditions and Complications:   Dimension 2:  Description of patient's biomedical conditions and  complications: n/a  Dimension 3:  Emotional, Behavioral, or Cognitive Conditions and Complications:  Dimension 3:  Description of emotional, behavioral, or cognitive conditions and complications: n/a  Dimension 4:  Readiness to Change:  Dimension 4:  Description of Readiness to Change criteria: n/a  Dimension 5:  Relapse, Continued use, or Continued Problem Potential:  Dimension 5:  Relapse, continued use, or continued problem potential critiera description: n/a  Dimension 6:  Recovery/Living Environment:  Dimension 6:  Recovery/Iiving environment criteria description: n/a  ASAM Severity Score: ASAM's Severity Rating Score: 0  ASAM Recommended Level of Treatment: ASAM Recommended Level of Treatment:  (n/a)   Substance use Disorder (SUD) Substance Use Disorder (SUD)  Checklist Symptoms of Substance Use:  (n/a)  Recommendations for Services/Supports/Treatments: Recommendations for  Services/Supports/Treatments Recommendations For Services/Supports/Treatments: Individual Therapy, Medication Management, Inpatient Hospitalization, Other (Comment) (observation)  Discharge Disposition: Discharge Disposition Medical Exam completed: Yes  DSM5 Diagnoses: Patient Active Problem List   Diagnosis Date Noted  . Diabetes (HCC) 12/15/2021  . Hyperlipidemia 12/15/2021  . Psychosis, unspecified psychosis type (HCC) 12/07/2021  . SIRS (systemic inflammatory response syndrome) (HCC) 11/17/2021  . Hypokalemia 11/17/2021  . Dehydration 11/17/2021  . AKI (acute kidney injury) - treated and resolved 11/16/2021  . Overdose, intentional self-harm, initial encounter (HCC) 02/24/2020  . Schizophrenia (HCC) 02/24/2020  . Overdose, undetermined intent, initial encounter 02/23/2020  . Overdose 02/23/2020  . Paranoid schizophrenia (HCC) 02/02/2020  . Schizophrenia, paranoid (HCC) 05/31/2019  . Tobacco use disorder   . Schizoaffective disorder, bipolar type (HCC) 06/29/2014  . Psychosis (HCC) 07/27/2013     Referrals  to Alternative Service(s): Referred to Alternative Service(s):   Place:   Date:   Time:    Referred to Alternative Service(s):   Place:   Date:   Time:    Referred to Alternative Service(s):   Place:   Date:   Time:    Referred to Alternative Service(s):   Place:   Date:   Time:     Burnetta Sabin, [facility]

## 2022-05-23 NOTE — ED Notes (Signed)
Pt care taken, no complaints at this time.

## 2022-05-23 NOTE — ED Notes (Signed)
Pt given peanut butter crackers and beverage per request.

## 2022-05-23 NOTE — ED Notes (Signed)
Pt requests tylenol for "flashbacks to an in-grown toenail"

## 2022-05-24 ENCOUNTER — Encounter (HOSPITAL_COMMUNITY): Payer: Self-pay | Admitting: Psychiatry

## 2022-05-24 ENCOUNTER — Encounter (HOSPITAL_COMMUNITY): Payer: Self-pay | Admitting: Emergency Medicine

## 2022-05-24 ENCOUNTER — Inpatient Hospital Stay (HOSPITAL_COMMUNITY)
Admission: AD | Admit: 2022-05-24 | Discharge: 2022-06-03 | DRG: 885 | Disposition: A | Discharge status: Home / Self Care | Payer: 59 | Source: Intra-hospital | Attending: Psychiatry | Admitting: Psychiatry

## 2022-05-24 ENCOUNTER — Other Ambulatory Visit: Payer: Self-pay

## 2022-05-24 DIAGNOSIS — Z560 Unemployment, unspecified: Secondary | ICD-10-CM | POA: Diagnosis not present

## 2022-05-24 DIAGNOSIS — Z9151 Personal history of suicidal behavior: Secondary | ICD-10-CM | POA: Diagnosis not present

## 2022-05-24 DIAGNOSIS — F17210 Nicotine dependence, cigarettes, uncomplicated: Secondary | ICD-10-CM | POA: Diagnosis present

## 2022-05-24 DIAGNOSIS — Z888 Allergy status to other drugs, medicaments and biological substances status: Secondary | ICD-10-CM | POA: Diagnosis not present

## 2022-05-24 DIAGNOSIS — Z7984 Long term (current) use of oral hypoglycemic drugs: Secondary | ICD-10-CM | POA: Diagnosis not present

## 2022-05-24 DIAGNOSIS — M25561 Pain in right knee: Secondary | ICD-10-CM | POA: Diagnosis not present

## 2022-05-24 DIAGNOSIS — Z79891 Long term (current) use of opiate analgesic: Secondary | ICD-10-CM | POA: Diagnosis not present

## 2022-05-24 DIAGNOSIS — Z91148 Patient's other noncompliance with medication regimen for other reason: Secondary | ICD-10-CM

## 2022-05-24 DIAGNOSIS — F17200 Nicotine dependence, unspecified, uncomplicated: Secondary | ICD-10-CM | POA: Diagnosis present

## 2022-05-24 DIAGNOSIS — Z1152 Encounter for screening for COVID-19: Secondary | ICD-10-CM | POA: Diagnosis not present

## 2022-05-24 DIAGNOSIS — Z79899 Other long term (current) drug therapy: Secondary | ICD-10-CM | POA: Diagnosis not present

## 2022-05-24 DIAGNOSIS — F200 Paranoid schizophrenia: Secondary | ICD-10-CM

## 2022-05-24 DIAGNOSIS — T43506A Underdosing of unspecified antipsychotics and neuroleptics, initial encounter: Secondary | ICD-10-CM | POA: Diagnosis present

## 2022-05-24 DIAGNOSIS — E119 Type 2 diabetes mellitus without complications: Secondary | ICD-10-CM | POA: Diagnosis present

## 2022-05-24 DIAGNOSIS — T50902A Poisoning by unspecified drugs, medicaments and biological substances, intentional self-harm, initial encounter: Secondary | ICD-10-CM | POA: Diagnosis present

## 2022-05-24 DIAGNOSIS — Z62810 Personal history of physical and sexual abuse in childhood: Secondary | ICD-10-CM

## 2022-05-24 DIAGNOSIS — E785 Hyperlipidemia, unspecified: Secondary | ICD-10-CM | POA: Diagnosis present

## 2022-05-24 MED ORDER — METFORMIN HCL 500 MG PO TABS
1000.0000 mg | ORAL_TABLET | Freq: Two times a day (BID) | ORAL | Status: DC
  Administered 2022-05-24 – 2022-06-03 (×20): 1000 mg via ORAL
  Filled 2022-05-24 (×26): qty 2

## 2022-05-24 MED ORDER — HYDROXYZINE HCL 25 MG PO TABS
25.0000 mg | ORAL_TABLET | Freq: Three times a day (TID) | ORAL | Status: DC | PRN
  Administered 2022-05-24 – 2022-06-01 (×2): 25 mg via ORAL
  Filled 2022-05-24 (×3): qty 1

## 2022-05-24 MED ORDER — DIVALPROEX SODIUM 500 MG PO DR TAB
500.0000 mg | DELAYED_RELEASE_TABLET | Freq: Two times a day (BID) | ORAL | Status: DC
  Administered 2022-05-24 – 2022-06-03 (×20): 500 mg via ORAL
  Filled 2022-05-24 (×28): qty 1

## 2022-05-24 MED ORDER — TRAZODONE HCL 50 MG PO TABS
50.0000 mg | ORAL_TABLET | Freq: Every evening | ORAL | Status: DC | PRN
  Administered 2022-05-24: 50 mg via ORAL
  Filled 2022-05-24: qty 1

## 2022-05-24 MED ORDER — HALOPERIDOL 5 MG PO TABS
10.0000 mg | ORAL_TABLET | Freq: Two times a day (BID) | ORAL | Status: DC
  Administered 2022-05-24 – 2022-05-25 (×2): 10 mg via ORAL
  Filled 2022-05-24 (×7): qty 2

## 2022-05-24 MED ORDER — MAGNESIUM HYDROXIDE 400 MG/5ML PO SUSP: 30.0000 mL | Freq: Every day | ORAL | Status: DC | PRN

## 2022-05-24 MED ORDER — ACETAMINOPHEN 325 MG PO TABS
650.0000 mg | ORAL_TABLET | Freq: Four times a day (QID) | ORAL | Status: DC | PRN
  Administered 2022-05-29 – 2022-06-01 (×2): 650 mg via ORAL
  Filled 2022-05-24 (×2): qty 2

## 2022-05-24 MED ORDER — EMPAGLIFLOZIN 25 MG PO TABS
25.0000 mg | ORAL_TABLET | Freq: Every day | ORAL | Status: DC
  Administered 2022-05-25 – 2022-06-03 (×10): 25 mg via ORAL
  Filled 2022-05-24 (×12): qty 1

## 2022-05-24 MED ORDER — TRAZODONE HCL 50 MG PO TABS
50.0000 mg | ORAL_TABLET | Freq: Once | ORAL | Status: AC
  Administered 2022-05-24: 50 mg via ORAL
  Filled 2022-05-24 (×2): qty 1

## 2022-05-24 MED ORDER — ALUM & MAG HYDROXIDE-SIMETH 200-200-20 MG/5ML PO SUSP
30.0000 mL | ORAL | Status: DC | PRN
  Administered 2022-06-02: 30 mL via ORAL
  Filled 2022-05-24: qty 30

## 2022-05-24 NOTE — Progress Notes (Signed)
Admission Note: Patient is a 47 year old male admitted to the unit involuntarily from [facility] for medication noncompliance.  Patient presents with a flat affect and mood.  Per IVC report: Patient has not being sleeping; stop taking his medications; pouring soda in the heater; putting phone in the toilet; threw remote in water.  Patient is alert and oriented to person and place.  Admission plan of care reviewed, consent for treatment signed.  Skin and personal belongings completed.  Skin is dry and intact.  No contraband found.  Patient oriented to the unit, staff and room.  Routine safety checks initiated.  Patient is safe on the unit.

## 2022-05-24 NOTE — ED Notes (Signed)
Patient transported to [facility] by sheriff. All belongings given to sheriff.

## 2022-05-24 NOTE — Tx Team (Signed)
Initial Treatment Plan 05/24/2022 3:54 PM [name] MAU:[id number]    PATIENT STRESSORS: Financial difficulties   Medication change or noncompliance     PATIENT STRENGTHS: Capable of independent living  Supportive family/friends    PATIENT IDENTIFIED PROBLEMS: Medication noncompliance  Ineffective coping skills  Insomnia                 DISCHARGE CRITERIA:  Adequate post-discharge living arrangements Motivation to continue treatment in a less acute level of care  PRELIMINARY DISCHARGE PLAN: Attend aftercare/continuing care group Outpatient therapy  PATIENT/FAMILY INVOLVEMENT: This treatment plan has been presented to and reviewed with the patient, [name], and/or family member.  The patient and family have been given the opportunity to ask questions and make suggestions.  Vela Prose, RN 05/24/2022, 3:54 PM

## 2022-05-24 NOTE — Progress Notes (Signed)
Pt was accepted to [facility] 05/24/2022, pending IVC paperwork faxed to [phone]. Bed assignment: 500-1  Pt meets inpatient criteria per Michaele Offer, NP  Attending Physician will be Janine Limbo, MD  Report can be called to: Adult unit: [phone]  Pt can arrive after 12 PM  Care Team Notified: Michaele Offer, NP, Scharlene Gloss, RN, and Leona Singleton, RN  [place], [place]  05/24/2022 11:11 AM

## 2022-05-24 NOTE — ED Notes (Deleted)
Pt received breakfast tray 

## 2022-05-24 NOTE — Progress Notes (Signed)
  On assessment, patient is observed in hallway.  Patient presents with mild anxiety and depression.  Patient reports, "my mom doesn't understand, then she'll call the law."  Patient denies SI/HI/AVH, but appears to be responding to internal stimuli during the assessment.  Scheduled medications were administered.  PRN Hydroxyzine and Trazodone was administered per [facility] per patient request.  Patient was provided emotional support.  Patient is safe on the unit with Q 15 minute safety checks.    05/24/22 2100  Psych Admission Type (Psych Patients Only)  Admission Status Involuntary  Psychosocial Assessment  Patient Complaints Anxiety;Depression;Sadness  Eye Contact Fair  Facial Expression Anxious  Affect Flat  Speech Logical/coherent  Interaction Assertive  Motor Activity Slow  Appearance/Hygiene Unremarkable  Behavior Characteristics Cooperative;Appropriate to situation  Mood Pleasant;Depressed;Anxious  Thought Process  Coherency WDL  Content Paranoia;Blaming others  Delusions Paranoid  Perception WDL  Hallucination None reported or observed  Judgment Poor  Confusion None  Danger to Self  Current suicidal ideation? Denies  Danger to Others  Danger to Others None reported or observed
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
## 2022-05-24 NOTE — Plan of Care (Signed)
  Problem: Health Behavior/Discharge Planning: Goal: Compliance with prescribed medication regimen will improve Outcome: Progressing   Problem: Self-Care: Goal: Ability to participate in self-care as condition permits will improve Outcome: Progressing

## 2022-05-25 DIAGNOSIS — F200 Paranoid schizophrenia: Secondary | ICD-10-CM

## 2022-05-25 LAB — HEMOGLOBIN A1C
Hgb A1c MFr Bld: 5.4 % (ref 4.8–5.6)
Mean Plasma Glucose: 108.28 mg/dL

## 2022-05-25 LAB — LIPID PANEL
Cholesterol: 140 mg/dL (ref 0–200)
HDL: 34 mg/dL — ABNORMAL LOW (ref >40)
LDL Cholesterol: 77 mg/dL (ref 0–99)
Total CHOL/HDL Ratio: 4.1 RATIO
Triglycerides: 144 mg/dL (ref <150)
VLDL: 29 mg/dL (ref 0–40)

## 2022-05-25 LAB — TSH: TSH: 1.529 u[IU]/mL (ref 0.350–4.500)

## 2022-05-25 MED ORDER — PALIPERIDONE PALMITATE ER 234 MG/1.5ML IM SUSY
234.0000 mg | PREFILLED_SYRINGE | Freq: Once | INTRAMUSCULAR | Status: AC
  Administered 2022-05-25: 234 mg via INTRAMUSCULAR
  Filled 2022-05-25: qty 1.5

## 2022-05-25 MED ORDER — TRAZODONE HCL 100 MG PO TABS
100.0000 mg | ORAL_TABLET | Freq: Every evening | ORAL | Status: DC | PRN
  Administered 2022-05-26: 100 mg via ORAL
  Filled 2022-05-25: qty 1

## 2022-05-25 MED ORDER — DIVALPROEX SODIUM 500 MG PO DR TAB
500.0000 mg | DELAYED_RELEASE_TABLET | Freq: Two times a day (BID) | ORAL | Status: DC
  Filled 2022-05-25 (×2): qty 1

## 2022-05-25 MED ORDER — NICOTINE POLACRILEX 2 MG MT GUM
2.0000 mg | CHEWING_GUM | OROMUCOSAL | Status: DC | PRN
  Administered 2022-05-26 – 2022-06-03 (×4): 2 mg via ORAL
  Filled 2022-05-25 (×4): qty 1

## 2022-05-25 MED ORDER — NICOTINE 21 MG/24HR TD PT24
21.0000 mg | MEDICATED_PATCH | Freq: Every day | TRANSDERMAL | Status: DC
  Filled 2022-05-25 (×2): qty 1

## 2022-05-25 MED ORDER — HALOPERIDOL 5 MG PO TABS
10.0000 mg | ORAL_TABLET | Freq: Every day | ORAL | Status: DC
  Administered 2022-05-26: 10 mg via ORAL
  Filled 2022-05-25 (×2): qty 2

## 2022-05-25 MED ORDER — ATORVASTATIN CALCIUM 40 MG PO TABS
40.0000 mg | ORAL_TABLET | Freq: Every day | ORAL | Status: DC
  Filled 2022-05-25 (×2): qty 1

## 2022-05-25 NOTE — Progress Notes (Signed)
Pt medication compliant. Pt attended group with encouragement. Pt was observed this morning pacing unit and laughing and talking by himself. This afternoon pt presents calm, spending most of the time in his room. Pt denies suicidal and homicidal thoughts. Pt denied hallucinations when asked. Q 15 minute checks ongoing for safety.
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
## 2022-05-25 NOTE — Progress Notes (Signed)
Pt observed in hallway laughing loudly and rubbing lotion all over his face. When nurse inquired he stated he was giving himself a facial. Several minutes later nurse was in male pts room and pt come to the door, bends over and making sexual motion with hands and private parts stating " I said I was giving myself a facial!" Pt redirected.

## 2022-05-25 NOTE — [facility] Group Notes (Signed)
Group Focus: affirmation, clarity of thought, and goals/reality orientation Treatment Modality:  Psychoeducation Interventions utilized were assignment, group exercise, and support Purpose: To be able to understand and verbalize the reason for their admission to the hospital. To understand that the medication helps with their chemical imbalance but they also need to work on their choices in life. To be challenged to develop a list of 30 positives about themselves. Also introduce the concept that "feelings" are not reality.  Participation Level:  Active  Participation Quality:  Appropriate  Affect:  Appropriate  Cognitive:  Appropriate  Insight:  Improving  Engagement in Group:  Engaged  Additional Comments: Rates his energy at a 10/10. Pt responding to the voices throughout the group.  [name]

## 2022-05-25 NOTE — Progress Notes (Signed)
Adult Psychoeducational Group Note  Date:  05/25/2022 Time:  8:45 PM  Group Topic/Focus:  Wrap-Up Group:   The focus of this group is to help patients review their daily goal of treatment and discuss progress on daily workbooks.  Participation Level:  Active  Participation Quality:  Appropriate  Affect:  Appropriate  Cognitive:  Appropriate  Insight: Appropriate  Engagement in Group:  Engaged  Modes of Intervention:  Education and Exploration  Additional Comments:  Patient attended and participated in group tonight. He reports that he learn that life is a continous journey no matter if you should die, life continues.  Salley Scarlet [facility] 05/25/2022, 8:45 PM

## 2022-05-25 NOTE — Progress Notes (Signed)
Administered additional dose of Trazodone per [facility] per patient request.

## 2022-05-25 NOTE — Group Note (Signed)
LCSW Group Therapy Note   No social work group was held; the following was provided to each patient  in lieu of in-person group:  Healthy vs. 

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
## 2022-05-25 NOTE — Progress Notes (Signed)
   05/25/22 0541  15 Minute Checks  Location Bedroom  Visual Appearance Calm;N/A patient off unit  Behavior Composed  Sleep (Behavioral Health Patients Only)  Calculate sleep? (Click Yes once per 24 hr at 0600 safety check) Yes  Documented sleep last 24 hours 4

## 2022-05-25 NOTE — [facility] Group Notes (Signed)
Adult Psychoeducational Group Note  Date:  05/25/2022 Time:  1:35 PM  Group Topic/Focus:  Goals Group:   The focus of this group is to help patients establish daily goals to achieve during treatment and discuss how the patient can incorporate goal setting into their daily lives to aide in recovery.  Participation Level:  Active  Participation Quality:  Appropriate  Affect:  Appropriate  Cognitive:  Appropriate  Insight: Limited  Engagement in Group:  Engaged  Modes of Intervention:  Education  Additional Comments:  Pt attended goal group. Pt goal for the day is to "Figure out what went wrong in my life". Pt was falling asleep throughout group but was engaged when up. Pt nurse has been notified.  [name] 05/25/2022, 1:35 PM

## 2022-05-26 DIAGNOSIS — F200 Paranoid schizophrenia: Secondary | ICD-10-CM | POA: Diagnosis not present

## 2022-05-26 LAB — VALPROIC ACID LEVEL: Valproic Acid Lvl: 41 ug/mL — ABNORMAL LOW (ref 50.0–100.0)

## 2022-05-26 MED ORDER — BENZTROPINE MESYLATE 1 MG PO TABS
1.0000 mg | ORAL_TABLET | Freq: Two times a day (BID) | ORAL | Status: DC
  Administered 2022-05-26 – 2022-05-29 (×6): 1 mg via ORAL
  Filled 2022-05-26 (×10): qty 1

## 2022-05-26 MED ORDER — HALOPERIDOL 5 MG PO TABS
10.0000 mg | ORAL_TABLET | Freq: Two times a day (BID) | ORAL | Status: DC
  Administered 2022-05-26 – 2022-06-03 (×16): 10 mg via ORAL
  Filled 2022-05-26 (×22): qty 2

## 2022-05-26 MED ORDER — TRAZODONE HCL 100 MG PO TABS
100.0000 mg | ORAL_TABLET | Freq: Every day | ORAL | Status: DC
  Administered 2022-05-26: 100 mg via ORAL
  Filled 2022-05-26 (×3): qty 1

## 2022-05-26 NOTE — [facility] Group Notes (Signed)
Adult Psychoeducational Group Note Date:  05/26/2022 Time:  0900-1000 Group Topic/Focus: PROGRESSIVE RELAXATION. A group where deep breathing is taught and tensing and relaxation muscle groups is used. Imagery is used as well.  Pts are asked to imagine 3 pillars that hold them up when they are not able to hold themselves up and to share that with the group.   Participation Level:  Active  Participation Quality:  Appropriate  Affect:  Appropriate  Cognitive:  Approprate  Insight: Improving  Engagement in Group:  Engaged  Modes of Intervention:  deep breathing, Imagery. Discussion  Additional Comments:  Pt rates his energy at a 10/10. States to meditate and to pray helps him.    : Paulino Rily

## 2022-05-26 NOTE — Progress Notes (Signed)
   05/25/22 2200  Psych Admission Type (Psych Patients Only)  Admission Status Involuntary  Psychosocial Assessment  Patient Complaints Anxiety  Eye Contact Fair  Facial Expression Animated  Affect Preoccupied  Speech Slow  Interaction Guarded  Motor Activity Pacing  Appearance/Hygiene In scrubs  Behavior Characteristics Cooperative  Mood Preoccupied  Thought Process  Coherency WDL  Content Preoccupation  Delusions Paranoid  Perception Hallucinations  Hallucination Auditory  Judgment Poor  Confusion None  Danger to Self  Current suicidal ideation? Denies  Danger to Others  Danger to Others None reported or observed

## 2022-05-26 NOTE — Progress Notes (Signed)
   05/26/22 0745  Psych Admission Type (Psych Patients Only)  Admission Status Involuntary  Psychosocial Assessment  Patient Complaints None  Eye Contact Staring  Facial Expression Anxious  Affect Preoccupied  Speech Slow  Interaction Guarded  Motor Activity Pacing  Appearance/Hygiene In scrubs  Behavior Characteristics Cooperative  Mood Preoccupied  Thought Process  Coherency Disorganized  Content Preoccupation  Delusions Paranoid  Perception Hallucinations  Hallucination Auditory  Judgment Poor  Confusion None  Danger to Self  Current suicidal ideation? Denies  Danger to Others  Danger to Others None reported or observed

## 2022-05-26 NOTE — Progress Notes (Signed)
Pt presents with disorganized speech and delusional thinking.  Pt observed pacing at times on the unit.  Pt is easily redirectable.  Pt denies SI/HI.  Pt took medications without incident and appears to be in no distress at this time.

## 2022-05-26 NOTE — Progress Notes (Signed)
Adult Psychoeducational Group Note  Date:  05/26/2022 Time:  8:39 PM  Group Topic/Focus:  Wrap-Up Group:   The focus of this group is to help patients review their daily goal of treatment and discuss progress on daily workbooks.  Participation Level:  Active  Participation Quality:  Appropriate  Affect:  Appropriate  Cognitive:  Lacking  Insight: Lacking  Engagement in Group:  Engaged  Modes of Intervention:  Education and Exploration  Additional Comments:  Patient attended and participated in group tonight. He reports that today he was back in contact with the unknown.  Salley Scarlet [facility] 05/26/2022, 8:39 PM

## 2022-05-26 NOTE — Progress Notes (Signed)
Adult Psychoeducational Group Note  Date:  05/26/2022 Time:  10:46 AM  Group Topic/Focus:  Goals Group:   The focus of this group is to help patients establish daily goals to achieve during treatment and discuss how the patient can incorporate goal setting into their daily lives to aide in recovery.  Participation Level:  Active  Participation Quality:  Appropriate  Affect:  Appropriate  Cognitive:  Appropriate  Insight: Appropriate  Engagement in Group:  Engaged  Modes of Intervention:  Discussion  Additional Comments:  Patient attended morning orientation/goal group and participated  Taneshia Lorence W Sandip Power 6/43/3295, 10:46 AM

## 2022-05-26 NOTE — Progress Notes (Addendum)
[facility] MD Progress Note  05/26/2022 9:57 AM [name]  MRN:  [id number] Principal Problem: Paranoid schizophrenia (HCC) Diagnosis: Principal Problem:   Paranoid schizophrenia (HCC)   Reason for Admission: [name] is a 47 year old male with a reported past psychiatric history of schizophrenia-paranoid, bipolar disorder housing insecurity who presented to [facility] under [facility]C for medication noncompliance and bizarre behavior such as pouring soda in heater, putting phone in toilet, and not sleeping. Patient does not have legal guardian (mother states she does not have legal guardianship paperwork) and ACT team through [facility]. He had recent psychiatric hospitalization at [facility] 12/07/21-01/09/22. This is hospitalization day 2.  Yesterday's Psychiatry Team's Recommendation: --Restart haldol 10 mg daily for psychosis --Restart Depakote DR 500 mg bid for mood stabilization and impulsivity --Restart Hinda Glatter Sustenna 234 injection IM for psychosis  --NRT with patch and gum  Subjective:  Patient seen and assessed at bedside.  Patient reports some magical thinking related to speaking a spell leading to lightning happening this morning.  He denies SI/HI/AVH.  He reports he is eating and sleeping well.  He denies any acute complaints from reinitiation of LAI.  He reports mild right knee pain today.  He was mildly tangential today but better than yesterday.  He has no further complaints at this time. Discussed increasing his haldol to 10 mg bid and he was agreeable. Denies EPS and no abnormal gait or facial movements noted on exam. At the end of assessment, he laughed loudly inappropriately and was unable to explain why.  Objective:  Chart Review Past 24 hours of patient's chart was reviewed.  Patient is compliant with scheduled meds. Required Agitation PRNs: none Per RN notes, no documented behavioral issues and is  attending group. Patient slept, unknown hours  Total Time spent with  patient: 45 minutes  Past Psychiatric History:  Previous Psych Diagnoses: Schizophrenia, Schizoaffective disorder-bipolar type Prior inpatient treatment: [facility] 04/2018, [facility] 12/2021 Prior outpatient treatment: [facility] History of suicide: History of homicide: Denies Psychiatric medication history: In addition to above, Previously on 6/14 Zyprexa 5 mg qAM and 30 mg qHS; Prolixin 10 mg in 05/2020, Invega Sustenna, PO Invega, and Haldol (02/2020), Neurontin, Clozapine 9/23 (switched to invega LAI +haldol) Psychiatric medication compliance history: Reports compliance, and picking up medications appropriately, per pharmacy records Neuromodulation history: Denies Current Psychiatrist: Leandrew Koyanagi, DO (ACTT services via [facility])  Past Medical History:  Past Medical History:  Diagnosis Date   Bipolar disorder (HCC)    Bronchitis    Diabetes mellitus without complication (HCC)    Hyperlipidemia    Paranoid schizophrenia (HCC)    Schizophrenia (HCC)     Past Surgical History:  Procedure Laterality Date   TESTICLE IMPLANTATION TO THIGH     TESTICLE SURGERY     Family History:  Family History  Problem Relation Age of Onset   Schizophrenia Neg Hx      Social History:  Social History   Substance and Sexual Activity  Alcohol Use Not Currently   Comment: denies use 03/10/22     Social History   Substance and Sexual Activity  Drug Use Not Currently   Types: Marijuana   Comment: denies use 03/10/22    Social History   Socioeconomic History   Marital status: Single    Spouse name: Not on file   Number of children: Not on file   Years of education: 12 years   Highest education level: 12th grade  Occupational History   Occupation:  On disability  Tobacco Use   Smoking status: Every Day    Packs/day: 1.00    Types: Cigarettes   Smokeless tobacco: Never  Vaping Use   Vaping Use: Never used  Substance and Sexual Activity   Alcohol use: Not Currently     Comment: denies use 03/10/22   Drug use: Not Currently    Types: Marijuana    Comment: denies use 03/10/22   Sexual activity: Yes    Birth control/protection: Condom  Other Topics Concern   Not on file  Social History Narrative   Pt lives alone in apartment complex for mentally ill   Social Determinants of Health   Financial Resource Strain: Not on file  Food Insecurity: Unknown (05/24/2022)   Hunger Vital Sign    Worried About Running Out of Food in the Last Year: Patient refused    [place] in the Last Year: Patient refused  Transportation Needs: Unknown (05/24/2022)   [place] - Hydrologist (Medical): Patient refused    Lack of Transportation (Non-Medical): Patient refused  Physical Activity: Not on file  Stress: Not on file  Social Connections: Not on file   Additional Social History:                         Current Medications: Current Facility-Administered Medications  Medication Dose Route Frequency Provider Last Rate Last Admin   acetaminophen (TYLENOL) tablet 650 mg  650 mg Oral Q6H PRN Motley-Mangrum, Jadeka A, PMHNP       alum & mag hydroxide-simeth (MAALOX/MYLANTA) 200-200-20 MG/5ML suspension 30 mL  30 mL Oral Q4H PRN Motley-Mangrum, Jadeka A, PMHNP       divalproex (DEPAKOTE) DR tablet 500 mg  500 mg Oral Q12H Motley-Mangrum, Jadeka A, PMHNP   500 mg at 05/26/22 0739   empagliflozin (JARDIANCE) tablet 25 mg  25 mg Oral QAC breakfast Motley-Mangrum, Jadeka A, PMHNP   25 mg at 05/26/22 4098   haloperidol (HALDOL) tablet 10 mg  10 mg Oral Barton Fanny, MD       hydrOXYzine (ATARAX) tablet 25 mg  25 mg Oral TID PRN Motley-Mangrum, Jadeka A, PMHNP   25 mg at 05/24/22 2115   magnesium hydroxide (MILK OF MAGNESIA) suspension 30 mL  30 mL Oral Daily PRN Motley-Mangrum, Jadeka A, PMHNP       metFORMIN (GLUCOPHAGE) tablet 1,000 mg  1,000 mg Oral BID WC Motley-Mangrum, Jadeka A, PMHNP   1,000 mg at 05/26/22 0739   nicotine  polacrilex (NICORETTE) gum 2 mg  2 mg Oral PRN France Ravens, MD   2 mg at 05/26/22 1191   traZODone (DESYREL) tablet 100 mg  100 mg Oral QHS France Ravens, MD        Lab Results:  Results for orders placed or performed during the hospital encounter of 05/24/22 (from the past 24 hour(s))  Valproic acid level     Status: Abnormal   Collection Time: 05/26/22  6:39 AM  Result Value Ref Range   Valproic Acid Lvl 41 (L) 50.0 - 100.0 ug/mL    Blood Alcohol level:  Lab Results  Component Value Date   [facility] <10 05/22/2022   ETH <10 47/82/9562    Metabolic Disorder Labs: Lab Results  Component Value Date   HGBA1C 5.4 05/25/2022   MPG 108.28 05/25/2022   MPG 151.33 11/17/2021   Lab Results  Component Value Date   PROLACTIN 8.1 06/01/2019   Lab  Results  Component Value Date   CHOL 140 05/25/2022   TRIG 144 05/25/2022   HDL 34 (L) 05/25/2022   CHOLHDL 4.1 05/25/2022   VLDL 29 05/25/2022   LDLCALC 77 05/25/2022   LDLCALC 68 12/01/2021    Physical Findings: AIMS: 0, no EPS or cogwheeling rigidity noted  Musculoskeletal: Strength & Muscle Tone: within normal limits Gait & Station: normal  Psychiatric Specialty Exam:  Presentation  General Appearance:  Appropriate for Environment; Casual   Eye Contact: Fair   Speech: Clear and Coherent; Normal Rate   Speech Volume: Normal   Handedness: Right    Mood and Affect  Mood: Euthymic   Affect: Appropriate; Congruent    Thought Process  Thought Processes: Disorganized   Descriptions of Associations:Tangential   Orientation:Partial   Thought Content:Paranoid Ideation   History of Schizophrenia/Schizoaffective disorder:Yes   Duration of Psychotic Symptoms:Greater than six months   Hallucinations:Hallucinations: None  Ideas of Reference:Paranoia; Delusions   Suicidal Thoughts:Suicidal Thoughts: No  Homicidal Thoughts:Homicidal Thoughts: No   Sensorium  Memory: Remote  Fair   Judgment: Impaired   Insight: Poor    Executive Functions  Concentration: Fair   Attention Span: Fair   Recall: [place] of Knowledge: Fair   Language: Fair    Psychomotor Activity  Psychomotor Activity: Psychomotor Activity: Normal   Assets  Assets: Communication Skills; Social Support    Sleep  Sleep: Sleep: Fair    Physical Exam: Review of Systems  Respiratory:  Negative for shortness of breath.   Cardiovascular:  Negative for chest pain.  Gastrointestinal:  Negative for abdominal pain, constipation, diarrhea, heartburn, nausea and vomiting.  Neurological:  Negative for headaches.   Blood pressure 110/75, pulse 99, temperature 98.2 F (36.8 C), temperature source Oral, resp. rate 16, height 5\' 11"  (1.803 m), weight 111.1 kg, SpO2 98 %. Body mass index is 34.17 kg/m.   ASSESSMENT AND PLAN [name] is a 47 year old male with a reported past medical history of schizoaffective disorder, bipolar type, tobacco use disorder, dyslipidemia, and diabetes who presented to [facility] under IVC for medication noncompliance and bizarre behavior such as pouring soda in heater, putting phone in toilet, and not sleeping.  This is hospitalization day 2.  PLAN Safety and Monitoring: Involuntary admission to inpatient psychiatric unit for safety, stabilization and treatment Daily contact with patient to assess and evaluate symptoms and progress in treatment Patient's case to be discussed in multi-disciplinary team meeting Observation Level : q15 minute checks Vital signs: q12 hours Precautions: suicide, elopement, and assault   Psychiatric Problems Schizoaffective Disorder, Bipolar Type Tobacco Use Disorder --INCREASE haldol 10 mg bid for psychosis --START cogentin 1 mg bid for EPS prophylaxis given he is on 2 antipsychotics --Restart Depakote DR 500 mg bid for mood stabilization and impulsivity             -VPA trough level 43 (suggests  noncompliance)  -Repeat VPA level ~5-7 days --Received Lorayne Bender Sustenna 234 injection IM 05/25/22 --NRT with patch and gum --  The risks/benefits/side-effects/alternatives to this medication were discussed in detail with the patient and time was given for questions. The patient consents to medication trial.  -- Metabolic profile and EKG monitoring obtained while on an atypical antipsychotic (BMI: Body mass index is 34.17 kg/m. Lipid Panel: wnl HbgA1c: 5.4 QTc: 393) -- Encouraged patient to participate in unit milieu and in scheduled group therapies    3. Medical Management Diabetes Mellitus -Continue metformin 100 mg bid with meals -Continue jardiance 25 mg  daily  PRNs Tylenol 650 mg for mild pain Maalox/Mylanta 30 mL for indigestion Hydroxyzine 25 mg tid for anxiety Milk of Magnesia 30 mL for constipation Trazodone 50 mg for sleep   4. Discharge Planning: Social work and case management to assist with discharge planning and identification of hospital follow-up needs prior to discharge Estimated LOS: 7 days Discharge Concerns: Need to establish a safety plan; Medication compliance and effectiveness Discharge Goals: Return home with outpatient referrals for mental health follow-up including medication management/psychotherapy     Park Pope, MD 05/26/2022, 9:57 AM

## 2022-05-27 ENCOUNTER — Encounter (HOSPITAL_COMMUNITY): Payer: Self-pay

## 2022-05-27 DIAGNOSIS — F200 Paranoid schizophrenia: Secondary | ICD-10-CM | POA: Diagnosis not present

## 2022-05-27 MED ORDER — WHITE PETROLATUM EX OINT
TOPICAL_OINTMENT | CUTANEOUS | Status: AC
  Filled 2022-05-27: qty 15

## 2022-05-27 MED ORDER — TRAZODONE HCL 150 MG PO TABS
150.0000 mg | ORAL_TABLET | Freq: Every day | ORAL | Status: DC
  Administered 2022-05-27 – 2022-06-02 (×7): 150 mg via ORAL
  Filled 2022-05-27 (×10): qty 1

## 2022-05-27 NOTE — BH IP Treatment Plan (Signed)
Interdisciplinary Treatment and Diagnostic Plan Update  05/27/2022 Time of Session: 10am [name] MRN: [id number]  Principal Diagnosis: Paranoid schizophrenia [facility])  Secondary Diagnoses: Principal Problem:   Paranoid schizophrenia (HCC)   Current Medications:  Current Facility-Administered Medications  Medication Dose Route Frequency Provider Last Rate Last Admin   acetaminophen (TYLENOL) tablet 650 mg  650 mg Oral Q6H PRN Motley-Mangrum, Jadeka A, PMHNP       alum & mag hydroxide-simeth (MAALOX/MYLANTA) 200-200-20 MG/5ML suspension 30 mL  30 mL Oral Q4H PRN Motley-Mangrum, Jadeka A, PMHNP       benztropine (COGENTIN) tablet 1 mg  1 mg Oral Q12H Park Pope, MD   1 mg at 05/27/22 0742   divalproex (DEPAKOTE) DR tablet 500 mg  500 mg Oral Q12H Motley-Mangrum, Jadeka A, PMHNP   500 mg at 05/27/22 0742   empagliflozin (JARDIANCE) tablet 25 mg  25 mg Oral QAC breakfast Motley-Mangrum, Jadeka A, PMHNP   25 mg at 05/27/22 0743   haloperidol (HALDOL) tablet 10 mg  10 mg Oral Threasa Alpha, MD   10 mg at 05/27/22 0742   hydrOXYzine (ATARAX) tablet 25 mg  25 mg Oral TID PRN Motley-Mangrum, Jadeka A, PMHNP   25 mg at 05/24/22 2115   magnesium hydroxide (MILK OF MAGNESIA) suspension 30 mL  30 mL Oral Daily PRN Motley-Mangrum, Jadeka A, PMHNP       metFORMIN (GLUCOPHAGE) tablet 1,000 mg  1,000 mg Oral BID WC Motley-Mangrum, Jadeka A, PMHNP   1,000 mg at 05/27/22 5188   nicotine polacrilex (NICORETTE) gum 2 mg  2 mg Oral PRN Park Pope, MD   2 mg at 05/26/22 4166   traZODone (DESYREL) tablet 150 mg  150 mg Oral QHS Attiah, Nadir, MD       PTA Medications: Medications Prior to Admission  Medication Sig Dispense Refill Last Dose   atorvastatin (LIPITOR) 40 MG tablet Take 1 tablet (40 mg total) by mouth at bedtime. (Patient not taking: Reported on 05/22/2022) 30 tablet 0    cloZAPine (CLOZARIL) 50 MG tablet Take 3 tablets (150 mg total) by mouth at bedtime for 28 days. 84 tablet 0    divalproex  (DEPAKOTE) 500 MG DR tablet Take 1 tablet (500 mg total) by mouth every 12 (twelve) hours. (Patient taking differently: Take 1,000 mg by mouth at bedtime.) 60 tablet 0    empagliflozin (JARDIANCE) 25 MG TABS tablet Take 1 tablet (25 mg total) by mouth daily before breakfast. (Patient not taking: Reported on 05/22/2022) 30 tablet 2    haloperidol (HALDOL) 10 MG tablet Take 1 tablet (10 mg total) by mouth every 12 (twelve) hours. (Patient not taking: Reported on 05/22/2022) 60 tablet 0    INVEGA SUSTENNA 234 MG/1.5ML injection Inject into the muscle.      metFORMIN (GLUCOPHAGE) 1000 MG tablet Take 1 tablet (1,000 mg total) by mouth 2 (two) times daily with a meal. (Patient not taking: Reported on 05/22/2022) 60 tablet 2    SYNJARDY XR 01-999 MG TB24 Take by mouth. (Patient not taking: Reported on 05/22/2022)      temazepam (RESTORIL) 15 MG capsule Take 1 capsule (15 mg total) by mouth at bedtime as needed for up to 14 days for sleep. (Patient not taking: Reported on 04/08/2022) 14 capsule 0    traZODone (DESYREL) 100 MG tablet Take 1 tablet (100 mg total) by mouth at bedtime as needed for sleep. (Patient not taking: Reported on 05/22/2022) 30 tablet 2     Patient Stressors:  Financial difficulties   Medication change or noncompliance    Patient Strengths: Capable of independent living  Supportive family/friends   Treatment Modalities: Medication Management, Group therapy, Case management,  1 to 1 session with clinician, Psychoeducation, Recreational therapy.   Physician Treatment Plan for Primary Diagnosis: Paranoid schizophrenia ([place]) Long Term Goal(s):     Short Term Goals:    Medication Management: Evaluate patient's response, side effects, and tolerance of medication regimen.  Therapeutic Interventions: 1 to 1 sessions, Unit Group sessions and Medication administration.  Evaluation of Outcomes: Progressing  Physician Treatment Plan for Secondary Diagnosis: Principal Problem:   Paranoid  schizophrenia ([place])  Long Term Goal(s):     Short Term Goals:       Medication Management: Evaluate patient's response, side effects, and tolerance of medication regimen.  Therapeutic Interventions: 1 to 1 sessions, Unit Group sessions and Medication administration.  Evaluation of Outcomes: Progressing   RN Treatment Plan for Primary Diagnosis: Paranoid schizophrenia ([place]) Long Term Goal(s): Knowledge of disease and therapeutic regimen to maintain health will improve  Short Term Goals: Ability to remain free from injury will improve, Ability to verbalize frustration and anger appropriately will improve, Ability to demonstrate self-control, Ability to participate in decision making will improve, Ability to verbalize feelings will improve, Ability to disclose and discuss suicidal ideas, Ability to identify and develop effective coping behaviors will improve, and Compliance with prescribed medications will improve  Medication Management: RN will administer medications as ordered by provider, will assess and evaluate patient's response and provide education to patient for prescribed medication. RN will report any adverse and/or side effects to prescribing provider.  Therapeutic Interventions: 1 on 1 counseling sessions, Psychoeducation, Medication administration, Evaluate responses to treatment, Monitor vital signs and CBGs as ordered, Perform/monitor CIWA, COWS, AIMS and Fall Risk screenings as ordered, Perform wound care treatments as ordered.  Evaluation of Outcomes: Progressing   LCSW Treatment Plan for Primary Diagnosis: Paranoid schizophrenia ([place]) Long Term Goal(s): Safe transition to appropriate next level of care at discharge, Engage patient in therapeutic group addressing interpersonal concerns.  Short Term Goals: Engage patient in aftercare planning with referrals and resources, Increase social support, Increase ability to appropriately verbalize feelings, Increase emotional  regulation, Facilitate acceptance of mental health diagnosis and concerns, Facilitate patient progression through stages of change regarding substance use diagnoses and concerns, Identify triggers associated with mental health/substance abuse issues, and Increase skills for wellness and recovery  Therapeutic Interventions: Assess for all discharge needs, 1 to 1 time with Social worker, Explore available resources and support systems, Assess for adequacy in community support network, Educate family and significant other(s) on suicide prevention, Complete Psychosocial Assessment, Interpersonal group therapy.  Evaluation of Outcomes: Progressing   Progress in Treatment: Attending groups: Yes. Participating in groups: Yes. Taking medication as prescribed: Yes. Toleration medication: Yes. Family/Significant other contact made: No, will contact:  [name] [phone] Patient understands diagnosis: No. Discussing patient identified problems/goals with staff: Yes. Medical problems stabilized or resolved: Yes. Denies suicidal/homicidal ideation: Yes. Issues/concerns per patient self-inventory: No.   New problem(s) identified: No, Describe:  none reported   New Short Term/Long Term Goal(s):   medication stabilization, elimination of SI thoughts, development of comprehensive mental wellness plan.    Patient Goals:  Pt states, "I want to stabilize on medications"  Discharge Plan or Barriers:  Patient recently admitted. CSW will continue to follow and assess for appropriate referrals and possible discharge planning.    Reason for Continuation of Hospitalization: Delusions  Hallucinations  Medication stabilization Other; describe psychosis  Estimated Length of Stay: 5-7 days  Last 3 [place] Suicide Severity Risk Score: [place] Admission (Current) from 05/24/2022 in [place] [facility] ED from 05/22/2022 in [facility] Emergency Department at [facility] ED from 05/09/2022 in [facility] Emergency Department at [place] No Risk No Risk No Risk       Last PHQ 2/9 Scores:    11/02/2021    6:10 PM  Depression screen PHQ 2/9  Decreased Interest 1  Down, Depressed, Hopeless 0  PHQ - 2 Score 1    Scribe for Treatment Team: Zachery Conch, LCSW 05/27/2022 1:31 PM

## 2022-05-27 NOTE — Progress Notes (Signed)
Pt was encouraged but refused to attend group discussion 

## 2022-05-27 NOTE — Group Note (Signed)
[facility] LCSW Group Therapy Note   Group Date: 05/27/2022 Start Time: 1300 End Time: 1400   Type of Therapy/Topic:  Group Therapy:  Emotion Regulation  Participation Level:  Minimal   Mood: Anxious   Description of Group:    The purpose of this group is to assist patients in learning to regulate negative emotions and experience positive emotions. Patients will be guided to discuss ways in which they have been vulnerable to their negative emotions. These vulnerabilities will be juxtaposed with experiences of positive emotions or situations, and patients challenged to use positive emotions to combat negative ones. Special emphasis will be placed on coping with negative emotions in conflict situations, and patients will process healthy conflict resolution skills.  Therapeutic Goals: Patient will identify two positive emotions or experiences to reflect on in order to balance out negative emotions:  Patient will label two or more emotions that they find the most difficult to experience:  Patient will be able to demonstrate positive conflict resolution skills through discussion or role plays:   Summary of Patient Progress: Patient came to group and accepted the provided worksheet. Patient introduced himself in group and then left saying that his emotions were jealousy and love because he did not know which one was real he been received from individuals in his life. After leaving grop patient paced the halls with his worksheet and was talking to himself.     Therapeutic Modalities:   Cognitive Behavioral Therapy Feelings Identification Dialectical Behavioral Therapy   Sherre Lain, LCSWA

## 2022-05-27 NOTE — Progress Notes (Signed)
   05/27/22 0730  Psych Admission Type (Psych Patients Only)  Admission Status Involuntary  Psychosocial Assessment  Patient Complaints Restlessness  Eye Contact Brief  Facial Expression Animated  Affect Anxious  Speech Pressured  Interaction Other (Comment) (WDL)  Motor Activity Slow;Restless  Appearance/Hygiene In scrubs  Behavior Characteristics Pacing;Cooperative  Mood Preoccupied  Thought Process  Coherency Disorganized  Content Preoccupation  Delusions Paranoid  Perception Hallucinations  Hallucination Auditory  Judgment Poor  Confusion None  Danger to Self  Current suicidal ideation? Denies  Danger to Others  Danger to Others None reported or observed   Pt is disorganized, delusional and responding to internal stimuli.  Pt is pleasant and often observed laughing and singing while pacing in the hall way. RN administered medications per provider order.  RN assessed for needs and concerns and provided support.  Pt remains on q 15 min safety checks.  RN will continue to provide support as needed.

## 2022-05-27 NOTE — Group Note (Signed)
Recreation Therapy Group Note   Group Topic:Goal Setting  Group Date: 05/27/2022 Start Time: 1000 End Time: 1026 Facilitators: Devany Aja-McCall, LRT,CTRS Location: 500 Hall Dayroom   Goal Area(s) Addresses:  Patient will identify what a goal is. Patient will identify goals to work towards post d/c. Patient will identify the importance of goals.  Group Description: Setting Goals. Patients were given a worksheet that focused on goals.  Patients were to identify their priorities for the future.  Patients were to then identify what they tell themselves when they want to give up.  Lastly, patients were to identify 5 goals they was to accomplish post discharge.  Patients would then identify the action steps that need to be taken to reach said goals and a deadline for reaching those goals.    Affect/Mood: Appropriate   Participation Level: Active   Participation Quality: Independent   Behavior: Appropriate   Speech/Thought Process: Irrational   Insight: Lacking   Judgement: Lacking    Modes of Intervention: Worksheet   Patient Response to Interventions:  Engaged   Education Outcome:  Acknowledges education and In group clarification offered    Clinical Observations/Individualized Feedback: Pt was attentive and engaged.  Pt had moments were he would talk to himself during group session.  Pt was able to complete the assignment.  Pt identified his priorities as health, family finances funds unlimited, more family home remedies revealed to family and friends and medications/resurrection and surgery like Dr. Berdine Addison.  When pt feels like giving up, pt will "take a break but never give up ever again and continue to help those in my surroundings".  Some of the goal pt identified are as follows; first goal was to dunk in 2025 on his birthday.  Pt identified his action steps as continuing to train and not to show all his skills until 2035 on his birthday.  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  Goal 3 was to be himself on video games of the future; action steps are to work at [employer] as soon as possible, live in [place] or close to it.      Plan: Continue to engage patient in RT group sessions 2-3x/week.   Hayzlee Mcsorley-McCall, LRT,CTRS 05/27/2022 12:58 PM

## 2022-05-27 NOTE — Progress Notes (Signed)
Recreation Therapy Notes  INPATIENT RECREATION THERAPY ASSESSMENT  Patient Details Name: KAIRAV RUSSOMANNO MRN: [id number] DOB: 1976-04-19 Today's Date: 05/27/2022       Information Obtained From: Patient  Able to Participate in Assessment/Interview: Yes  Patient Presentation: Hyperverbal (Rambles when speaking)  Reason for Admission (Per Patient): Other (Comments) ("my mom got scared of how I was acting.  She doesn't want me to grow up, she sees me as her little boy".)  Patient Stressors:  (None identified)  Coping Skills:   Isolation, TV, Sports, Music, Deep Breathing, Meditate, Talk, Prayer, Avoidance  Leisure Interests (2+):  Games - Video games, [place], [employer] - Other (Comment) (Going out to eat)  Frequency of Recreation/Participation: Other (Comment) (video games- 2010; Out to eat-Daily; Basketball- Been awhile; Social Media- Unable to give time frame)  Awareness of Community Resources:  Yes  Community Resources:  [employer]  Current Use:  (Couldn't explain)  If no, Barriers?:    Expressed Interest in [employer] Information: No  [employer] of Residence:  Investment banker, corporate  Patient 

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
## 2022-05-28 DIAGNOSIS — F200 Paranoid schizophrenia: Secondary | ICD-10-CM | POA: Diagnosis not present

## 2022-05-28 NOTE — Progress Notes (Signed)
Adult Psychoeducational Group Note  Date:  05/28/2022 Time:  8:20 PM  Group Topic/Focus:  Wrap-Up Group:   The focus of this group is to help patients review their daily goal of treatment and discuss progress on daily workbooks.  Participation Level:  Active  Participation Quality:  Appropriate  Affect:  Appropriate  Cognitive:  Disorganized  Insight: Limited  Engagement in Group:  Engaged  Modes of Intervention:  Discussion  Additional Comments:   Pt states that he had a good day because he was able to stretch. Pt states he had a good conversation with his mother and father, and also spoke with his doctor. Pt denies everything and states he wants to focus on staying on his medication.   Gerhard Perches 05/28/2022, 8:20 PM

## 2022-05-28 NOTE — Plan of Care (Signed)
  Problem: Education: Goal: Emotional status will improve Outcome: Progressing Goal: Mental status will improve Outcome: Progressing   Problem: Activity: Goal: Interest or engagement in activities will improve Outcome: Progressing   Problem: Coping: Goal: Ability to demonstrate self-control will improve Outcome: Progressing   Problem: Health Behavior/Discharge Planning: Goal: Compliance with treatment plan for underlying cause of condition will improve Outcome: Progressing

## 2022-05-28 NOTE — Progress Notes (Signed)
[facility] LCSW Progress Note   05/28/2022    2:03 PM  [name]      Type of Note: Collateral With ACTT services  CSW spoke with Tiffany at [facility] in [place], [place] regarding patient. Tiffany provided CSW with patient psychiatrist Silas [place] who goes out to see patient twice a week. However, CSW explained to Tiffany the concerns that patient mom has. Tiffany said that she will make [name] aware and said that patient mom would need to call to ask about long-term facilities and will provide her with a list; Also, to ask about the TCM ( Tailored Care Management ) that will also help with placement and medical necessities. CSW was unable call patient mom back due to the funeral arrangements today, but will follow up tomorrow.     Signed:   Silas Flood, MSW, [place] 05/28/2022 2:03 PM

## 2022-05-28 NOTE — Progress Notes (Signed)
D- alert and oriented. Denies SI, HI, and pain. Patient states that he speaks with God and "God talks back to me." Patient endorses VH stating that outside of his window he saw "swords coming from the sky. Like light sabers, but clear. I could see the electricity in them. I met [employer]	[place] 3 times. And Spiderman too."  A- Scheduled medications administered to patient, per MAR. Support and encouragement provided.  Routine safety checks conducted every 15 minutes.  Patient informed to notify staff with problems or concerns.  R- No adverse drug reactions noted. Patient contracts for safety at this time. Patient compliant with medications and treatment plan. Patient receptive, calm, and cooperative. Patient interacts well with others on the unit.  Patient remains safe at this time.

## 2022-05-28 NOTE — Group Note (Signed)
Recreation Therapy Group Note   Group Topic:Self-Esteem  Group Date: 05/28/2022 Start Time: 1000 End Time: 1610 Facilitators: Wiktoria Hemrick-McCall, LRT,CTRS Location: 500 Hall Dayroom   Goal Area(s) Addresses:  Patient will identify and write at least one positive trait about themself. Patient will acknowledge the benefit of healthy self-esteem. Patient will endorse understanding of ways to increase self-esteem.   Group Description:  LRT began group session with open dialogue asking the patients to define self-esteem and verbally identify positive qualities and traits people may possess. Patients were then instructed to design a personalized license plate, with words and drawings, representing at least 3 positive things about themselves. Pts were encouraged to include favorites, things they are proud of, what they enjoy doing, and dreams for their future. If a patient had a life motto or a meaningful phase that expressed their life values, pt's were asked to incorporate that into their design as well. Patients were given the opportunity to share their completed work with the group.   Affect/Mood: Appropriate   Participation Level: Minimal   Participation Quality: Independent   Behavior: Cooperative   Speech/Thought Process: Flight of ideas   Insight: Lacking   Judgement: Lacking    Modes of Intervention: Art   Patient Response to Interventions:  Receptive   Education Outcome:  Acknowledges education and In group clarification offered    Clinical Observations/Individualized Feedback: Pt couldn't focus while in group long.  Pt was in group long enough for the instructions.  Pt left and started pacing the hallway, talking to himself and in and out his room.  Pt came back and described himself as being kind to others and fun loving.     Plan: Continue to engage patient in RT group sessions 2-3x/week.   [name], LRT,CTRS 05/28/2022 12:29 PM

## 2022-05-28 NOTE — Progress Notes (Signed)
Pt presents with pleasant mood, affect cooperative. [name] states he is doing good, and '' I want to get discharged so I can go get some pizza at [employer] in [place]. The only thing I'm worried about is the locks on my momma's house.  You know a ghost can unlock a door knob if they aren't a dead bolt but I can do that, I can change it. But I'm not hearing any voices now anymore. It's dangerous when you do and I have before , but I'm fine now. ''  Pt has been interactive with peers on the unit, he still remains disorganized at times but redirects easily. Is pleasant and cooperative, able to make his needs known. Denies any SI HI or AV Hallucinations.  Above discussed in treatment team with leadership, MD, SW.  Pt is safe. Will con't to monitor.

## 2022-05-28 NOTE — [facility] Group Notes (Signed)
PsychoEducational Group- Patients were given a poem by Dairl Ponder '' The Owl and the Chimpanzee ''  Cognitive behavioral therapy techniques were used to allow patients to explore times in their life when they overcame their anxiety.  Patient left group early, did not participate.
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
## 2022-05-28 NOTE — Progress Notes (Signed)
   05/28/22 0557  15 Minute Checks  Location Bathroom/Shower  Visual Appearance Calm  Behavior Composed  Sleep (Behavioral Health Patients Only)  Calculate sleep? (Click Yes once per 24 hr at 0600 safety check) Yes  Documented sleep last 24 hours 5

## 2022-05-28 NOTE — Progress Notes (Signed)
   05/27/22 2040  Psych Admission Type (Psych Patients Only)  Admission Status Involuntary  Psychosocial Assessment  Patient Complaints Restlessness  Eye Contact Fair  Facial Expression Animated  Affect Euphoric;Preoccupied  Speech Loud;Tangential;Rapid  Interaction Assertive  Motor Activity Restless;Pacing  Appearance/Hygiene Poor hygiene  Behavior Characteristics Cooperative;Pacing  Mood Euphoric;Preoccupied  Thought Process  Coherency Disorganized  Content Preoccupation  Delusions Paranoid  Perception Hallucinations  Hallucination Auditory  Judgment Poor  Confusion None  Danger to Self  Current suicidal ideation? Denies  Danger to Others  Danger to Others None reported or observed

## 2022-05-28 NOTE — Progress Notes (Signed)
Adult Psychoeducational Group Note  Date:  05/28/2022 Time:  10:04 AM  Group Topic/Focus:  Goals Group:   The focus of this group is to help patients establish daily goals to achieve during treatment and discuss how the patient can incorporate goal setting into their daily lives to aide in recovery.  Participation Level:  Active  Participation Quality:  Appropriate  Affect:  Appropriate  Cognitive:  Appropriate  Insight: Appropriate  Engagement in Group:  Engaged  Modes of Intervention:  Discussion  Additional Comments:  Patient attended morning orientation/goal group and participated.  Doyal Saric W Bailea Beed 12/06/9831, 10:04 AM
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
## 2022-05-29 LAB — VALPROIC ACID LEVEL: Valproic Acid Lvl: 39 ug/mL — ABNORMAL LOW (ref 50.0–100.0)

## 2022-05-29 MED ORDER — LORAZEPAM 1 MG PO TABS: 1.0000 mg | ORAL_TABLET | ORAL | Status: DC | PRN

## 2022-05-29 MED ORDER — BENZTROPINE MESYLATE 2 MG PO TABS
2.0000 mg | ORAL_TABLET | Freq: Two times a day (BID) | ORAL | Status: DC
  Administered 2022-05-29 – 2022-06-03 (×10): 2 mg via ORAL
  Filled 2022-05-29 (×14): qty 1

## 2022-05-29 MED ORDER — ZIPRASIDONE MESYLATE 20 MG IM SOLR: 20.0000 mg | Freq: Two times a day (BID) | INTRAMUSCULAR | Status: DC | PRN

## 2022-05-29 MED ORDER — ZIPRASIDONE HCL 20 MG PO CAPS: 20.0000 mg | ORAL_CAPSULE | Freq: Two times a day (BID) | ORAL | Status: DC | PRN

## 2022-05-29 NOTE — BHH Group Notes (Signed)
PsychoEducational Group Note- Patients were given education about how to identify signs of mental health decompensation, and identify healthy coping skills. Pts were given toy car and given analogy of brain function as it pertains to mental health.  Patient attended the group but did not participate.

## 2022-05-29 NOTE — Progress Notes (Signed)
[facility] MD Progress Note  05/29/2022 8:09 AM [name]  MRN:  [id number]   Reason for Admission:  [name] is a 47 y.o. male with a history of schizophrenia-paranoid, bipolar disorder housing insecurity, who was initially admitted for inpatient psychiatric hospitalization on 05/24/2022 for management of medication noncompliance and bizarre behavior such as pouring soda in heater, putting phone in toilet, and not sleeping. The patient is currently on Hospital Day 5.   Chart Review from last 24 hours:  The patient's chart was reviewed and nursing notes were reviewed. The patient's case was discussed in multidisciplinary team meeting. Per [facility], patient was taking medications appropriately. Per nursing, patient is calm and cooperative and attended 1 group with minimal participation. Patient received the following PRN medications: Nicorette gum x1  Information Obtained Today During Patient Interview: The patient was seen and evaluated on the unit. On assessment today the patient reports feeling well today. He slept well after and episode of agitation and hallucination (seeing swords falling from the sky) in the evening per nursing report. IM Geodon was ordered however he fell asleep before any medication administration. He continues to have good appetite and denies any side effects from his medications. He feels that the medications are helping him think more clearly. He has been attending group session and he reports they are helpful.  He denies SI, HI, AVH. During the conversation he became fixated on recounting a time his toilet moved on its own after flushing it. This occurred months ago and denies seeing this happen recently.  He denies feeling anxious about anything or paranoia. No evidence of EPS during evaluation.    Principal Problem: Paranoid schizophrenia ([place]) Diagnosis: Principal Problem:   Paranoid schizophrenia ([place])  Past Psychiatric History:  Previous Psych Diagnoses:  Schizophrenia, Schizoaffective disorder-bipolar type Prior inpatient treatment: [facility] 04/2018, [facility] 12/[facility]21 Prior outpatient treatment: [facility] History of suicide: History of homicide: Denies Psychiatric medication history: In addition to above, Previously on 6/14 Zyprexa 5 mg qAM and 30 mg qHS; Prolixin 10 mg in 05/2020, Invega Sustenna, PO Invega, and Haldol (02/2020), Neurontin, Clozapine 9/23 (switched to invega LAI +haldol) Psychiatric medication compliance history: Reports compliance, and picking up medications appropriately, per pharmacy records Neuromodulation history: Denies Current Psychiatrist: [name], DO ([facility] via [facility])  Past Medical History:  Past Medical History:  Diagnosis Date   Bipolar disorder ([place])    Bronchitis    Diabetes mellitus without complication ([place])    Hyperlipidemia    Paranoid schizophrenia ([place])    Schizophrenia ([place])     Past Surgical History:  Procedure Laterality Date   TESTICLE IMPLANTATION TO THIGH     TESTICLE SURGERY     Family History:  Family History  Problem Relation Age of Onset   Schizophrenia Neg Hx    Family Psychiatric  History: None reported Social History: See H&P  Current Medications: Current Facility-Administered Medications  Medication Dose Route Frequency Provider Last Rate Last Admin   acetaminophen (TYLENOL) tablet 650 mg  650 mg Oral Q6H PRN Motley-Mangrum, Jadeka A, PMHNP       alum & mag hydroxide-simeth (MAALOX/MYLANTA) 200-200-20 MG/5ML suspension 30 mL  30 mL Oral Q4H PRN Motley-Mangrum, Jadeka A, PMHNP       benztropine (COGENTIN) tablet 1 mg  1 mg Oral Q12H France Ravens, MD   1 mg at 05/29/22 0745   divalproex (DEPAKOTE) DR tablet 500 mg  500 mg Oral Q12H Motley-Mangrum, Donneta Romberg A, PMHNP   500  mg at 05/29/22 0745   empagliflozin (JARDIANCE) tablet 25 mg  25 mg Oral QAC breakfast Motley-Mangrum, Jadeka A, PMHNP   25 mg at 05/29/22 0604   haloperidol (HALDOL) tablet 10 mg  10 mg  Oral Threasa Alpha, MD   10 mg at 05/29/22 0744   hydrOXYzine (ATARAX) tablet 25 mg  25 mg Oral TID PRN Motley-Mangrum, Jadeka A, PMHNP   25 mg at 05/24/22 2115   magnesium hydroxide (MILK OF MAGNESIA) suspension 30 mL  30 mL Oral Daily PRN Motley-Mangrum, Jadeka A, PMHNP       metFORMIN (GLUCOPHAGE) tablet 1,000 mg  1,000 mg Oral BID WC Motley-Mangrum, Jadeka A, PMHNP   1,000 mg at 05/29/22 0744   nicotine polacrilex (NICORETTE) gum 2 mg  2 mg Oral PRN Park Pope, MD   2 mg at 05/28/22 0915   traZODone (DESYREL) tablet 150 mg  150 mg Oral QHS Attiah, Nadir, MD   150 mg at 05/28/22 2044   ziprasidone (GEODON) capsule 20 mg  20 mg Oral Q12H PRN Onuoha, Chinwendu V, NP        Lab Results:  Results for orders placed or performed during the hospital encounter of 05/24/22 (from the past 48 hour(s))  Valproic acid level     Status: Abnormal   Collection Time: 05/29/22  6:40 AM  Result Value Ref Range   Valproic Acid Lvl 39 (L) 50.0 - 100.0 ug/mL    Comment: Performed at [facility], [address], [place], [place] [address]    Blood Alcohol level:  Lab Results  Component Value Date   [facility] <10 05/22/2022   ETH <10 04/08/2022    Metabolic Disorder Labs: Lab Results  Component Value Date   HGBA1C 5.4 05/25/2022   MPG 108.28 05/25/2022   MPG 151.33 11/17/2021   Lab Results  Component Value Date   PROLACTIN 8.1 06/01/2019   Lab Results  Component Value Date   CHOL 140 05/25/2022   TRIG 144 05/25/2022   HDL 34 (L) 05/25/2022   CHOLHDL 4.1 05/25/2022   VLDL 29 05/25/2022   LDLCALC 77 05/25/2022   LDLCALC 68 12/01/2021    Physical Findings: AIMS: Facial and Oral Movements Muscles of Facial Expression: None, normal Lips and Perioral Area: None, normal Jaw: None, normal Tongue: None, normal,Extremity Movements Upper (arms, wrists, hands, fingers): None, normal Lower (legs, knees, ankles, toes): None, normal, Trunk Movements Neck, shoulders, hips: None,  normal, Overall Severity Severity of abnormal movements (highest score from questions above): None, normal Incapacitation due to abnormal movements: None, normal Patient's awareness of abnormal movements (rate only patient's report): No Awareness, Dental Status Current problems with teeth and/or dentures?: No Does patient usually wear dentures?: No  CIWA:    COWS:     Musculoskeletal: Strength & Muscle Tone: within normal limits and flaccid Gait & Station: normal Patient leans: N/A  Psychiatric Specialty Exam:  General Appearance: appears at stated age, in hospital gown  Behavior: pleasant and cooperative  Psychomotor Activity: no psychomotor agitation or retardation noted   Eye Contact: fair Speech: normal amount, tone, volume and fluency. Mildly slow rate however rapid at times when recounting past events and difficult to interrupt during these episodes.   Mood: euthymic Affect: congruent, pleasant and interactive  Thought Process: Largely tangential and disorganized Descriptions of Associations: intact Thought Content: Bizarre content and scattered  Hallucinations: denies AH, VH , does not appear responding to stimuli Delusions: Magical thinking and mild paranoia present. No delusions of  control, grandeur, ideas of reference, thought broadcasting Suicidal Thoughts: denies SI, intention, plan  Homicidal Thoughts: denies HI, intention, plan   Alertness/Orientation: alert and oriented x3, not to situation  Insight: limited Judgment: limited  Memory: intact  Executive Functions  Concentration: intact  Attention Span: fair Recall: intact Fund of Knowledge: limited   Animal nutritionist; Social Support    Physical Exam: Constitutional:      Appearance: Normal appearance.  Cardiovascular:     Rate and Rhythm: Normal rate.  Pulmonary:     Effort: Pulmonary effort is normal.  Neurological:     General: No focal deficit present.     Mental Status:  Alert and oriented to person, place, and time.    Review of Systems  Constitutional: Negative.  Negative for chills, fever and weight loss.  HENT: Negative.    Eyes: Negative.   Respiratory: Negative.    Cardiovascular: Negative.   Gastrointestinal:  Negative for constipation, diarrhea, nausea and vomiting.  Genitourinary: Negative.   Musculoskeletal: Negative.   Skin: Negative.   Neurological: Negative.  Negative for tingling.    ASSESSMENT AND PLAN [name] is a 47 year old male with a reported past medical history of schizoaffective disorder, bipolar type, tobacco use disorder, dyslipidemia, and diabetes who presented to [facility] under [facility] for medication noncompliance and bizarre behavior such as pouring soda in heater, putting phone in toilet, and not sleeping.  This is hospitalization day 5.   PLAN Safety and Monitoring: Involuntary admission to inpatient psychiatric unit for safety, stabilization and treatment Daily contact with patient to assess and evaluate symptoms and progress in treatment Patient's case to be discussed in multi-disciplinary team meeting Observation Level : q15 minute checks Vital signs: q12 hours Precautions: suicide, elopement, and assault   Psychiatric Problems Schizoaffective Disorder, Bipolar Type Tobacco Use Disorder --Continue haldol 10 mg bid for psychosis --Increased cogentin 2 mg q12h for EPS prophylaxis given he is on 2 antipsychotics --Restarted Depakote DR 500 mg bid for mood stabilization and impulsivity             -Repeat VPA level is 39 this AM.  --Received Kirt Boys 234 injection IM 05/25/22 --NRT with patch and gum --  The risks/benefits/side-effects/alternatives to this medication were discussed in detail with the patient and time was given for questions. The patient consents to medication trial.  -- Metabolic profile and EKG monitoring obtained while on an atypical antipsychotic (BMI: Body mass index is 34.17 kg/m. Lipid  Panel: wnl HbgA1c: 5.4 QTc: 393) -- Encouraged patient to participate in unit milieu and in scheduled group therapies    3. Medical Management Diabetes Mellitus -Continue metformin 100 mg bid with meals -Continue jardiance 25 mg daily   PRNs Tylenol 650 mg for mild pain Maalox/Mylanta 30 mL for indigestion Hydroxyzine 25 mg tid for anxiety Milk of Magnesia 30 mL for constipation Trazodone 150 mg for sleep   4. Discharge Planning: Social work and case management to assist with discharge planning and identification of hospital follow-up needs prior to discharge Estimated LOS: 7 days Discharge Concerns: Need to establish a safety plan; Medication compliance and effectiveness Discharge Goals: Return home with outpatient referrals for mental health follow-up including medication management/psychotherapy Attending Note: I personally was present and performed or re-performed the history, physical exam and medical decision-making activities of this service and have verified that the service and findings are accurately documented in the student's note, , as addended by me or notated below: The patient was seen and evaluated  by this Probation officer.  He was noted to be alert oriented and cooperative but continues to have significant delusions and hallucinations.  He is fairly labile but redirectable.  He has bizarre associations.  He denies any active SI/HI but does have delusions and possibly auditory and visual hallucinations.  He is currently on Haldol in addition to the long-acting injectable Invega.  Some muscle stiffness which may be potential extrapyramidal symptoms noted and will be given Cogentin 2 mg twice daily.  Ranae Palms, MD Psychiatrist       Total Time Spent in Direct Patient Care:  I personally spent 30 minutes on the unit in direct patient care. The direct patient care time included face-to-face time with the patient, reviewing the patient's chart, communicating with other  professionals, and coordinating care. Greater than 50% of this time was spent in counseling or coordinating care with the patient regarding goals of hospitalization, psycho-education, and discharge planning needs.   Clydell Hakim, Medical Student  05/29/2022, 8:09 AM  Total Time Spent in Direct Patient Care:  I personally spent 30 minutes on the unit in direct patient care. The direct patient care time included face-to-face time with the patient, reviewing the patient's chart, communicating with other professionals, and coordinating care. Greater than 50% of this time was spent in counseling or coordinating care with the patient regarding goals of hospitalization, psycho-education, and discharge planning needs.   [place] Psychiatrist

## 2022-05-29 NOTE — Plan of Care (Signed)
  Problem: Education: Goal: Mental status will improve Outcome: Progressing   Problem: Activity: Goal: Sleeping patterns will improve Outcome: Progressing   Problem: Coping: Goal: Ability to verbalize frustrations and anger appropriately will improve Outcome: Progressing   Problem: Health Behavior/Discharge Planning: Goal: Compliance with treatment plan for underlying cause of condition will improve Outcome: Progressing   Problem: Health Behavior/Discharge Planning: Goal: Compliance with prescribed medication regimen will improve Outcome: Progressing

## 2022-05-29 NOTE — Progress Notes (Signed)
Patient becoming increasingly agitated through the evening. Patient in his room "talking to God." Patient walking the hallways talking about a girlfriend who was murdered. Patient stated, "I don't have schizophrenia. Never did. My mother and father are lying on." Provider notified and agitation protocol ordered. Patient fell back to sleep before medication administration. Patient remains safe at this time. Staff will continue to monitor.

## 2022-05-29 NOTE — Progress Notes (Signed)
D- Patient alert and oriented.  Denies SI, HI, AVH, and pain. Patient pleasant upon assessment.  A- Scheduled medications administered to patient, per MAR. Support and encouragement provided.  Routine safety checks conducted every 15 minutes.  Patient informed to notify staff with problems or concerns.  R- No adverse drug reactions noted. Patient contracts for safety at this time. Patient compliant with medications and treatment plan. Patient receptive, calm, and cooperative. Patient interacts well with others on the unit.  Patient remains safe at this time.

## 2022-05-29 NOTE — Progress Notes (Signed)
   05/29/22 0541  15 Minute Checks  Location Bedroom  Visual Appearance Calm  Behavior Composed  Sleep (Behavioral Health Patients Only)  Calculate sleep? (Click Yes once per 24 hr at 0600 safety check) Yes  Documented sleep last 24 hours 5.75

## 2022-05-29 NOTE — Progress Notes (Signed)
Patient ID: [name], male   DOB: [birth year], 47 y.o.   MRN: [id number] Pt presents with pleasant mood, affect congruent. [name] continues to show disorganized thoughts, he states he is doing ''ok''. He has been visible on the unit , at times he can be seen responding to internal stimuli, but has been med compliant, appropriate with his interactions with peers. He completed his self inventory and rates his depression, hopelessness and anxiety all at 0/10 on scale, 10 being worst, 0 being none. Pt writes his goal is '' going home and getting a vehicle, suv or car to drive. ''  Pt is safe, able to make his needs known.

## 2022-05-29 NOTE — Progress Notes (Signed)
The focus of this group is to help patients review their daily goal of treatment and discuss progress on daily workbooks.  Pt attended the evening group but struggled to respond on-topic to discussion prompts from the [place]. Pt instead only wanted to speak on how he was going to sue his parents for calling the police on him under false pretenses.  Though Pt was upset while discussing this subject, he was otherwise pleasant and his affect was appropriate.

## 2022-05-29 NOTE — [facility] Group Notes (Signed)
[facility] Group Notes:  (Nursing/MHT/Case Management/Adjunct)  Date:  05/29/2022  Time:  9:15 AM  Type of Therapy:  Group Topic/ Focus: Goals Group: The focus of this group is to help patients establish daily goals to achieve during treatment and discuss how the patient can incorporate goal setting into their daily lives to aide in recovery.   Participation Level:  Active  Participation Quality:  Appropriate  Affect:  Appropriate  Cognitive:  Appropriate  Insight:  Appropriate  Engagement in Group:  Limited  Modes of Intervention:  Discussion  Summary of Progress/Problems:  Patient attended and participated in a goals/ orientation group today. Patient's goal for today is to have a good discharge and his coping skill is to avoid danger.   Frances Furbish R Theoplis Garciagarcia 05/29/2022, 9:15 AM

## 2022-05-30 MED ORDER — PALIPERIDONE PALMITATE ER 234 MG/1.5ML IM SUSY
234.0000 mg | PREFILLED_SYRINGE | Freq: Once | INTRAMUSCULAR | Status: AC
  Administered 2022-05-30: 234 mg via INTRAMUSCULAR

## 2022-05-30 MED ORDER — PALIPERIDONE PALMITATE ER 234 MG/1.5ML IM SUSY
234.0000 mg | PREFILLED_SYRINGE | Freq: Once | INTRAMUSCULAR | Status: DC
  Filled 2022-05-30: qty 1.5

## 2022-05-30 NOTE — Progress Notes (Signed)
[facility] MD Progress Note  05/30/2022 1:54 PM SYED ZUKAS  MRN:  [id number]   Reason for Admission:  [name] is a 47 y.o. male with a history of schizophrenia-paranoid, bipolar disorder housing insecurity, who was initially admitted for inpatient psychiatric hospitalization on 05/24/2022 for management of medication noncompliance and bizarre behavior such as pouring soda in heater, putting phone in toilet, and not sleeping. The patient is currently on Hospital Day 6.   Chart Review from last 24 hours:  The patient's chart was reviewed and nursing notes were reviewed. The patient's case was discussed in multidisciplinary team meeting.  Per [facility], patient was taking medications appropriately. Per nursing, patient is mostly calm calm and cooperative, and pleasantly disorganized.  However he was agitated last night talking to God, deceased girlfriend.  Attended 2/2 groups with minimal participation.  Patient received the following PRN medications: Tylenol x 1, no agitation PRNs  Information Obtained Today During Patient Interview: The patient was seen and evaluated on the unit. On assessment today the patient reports "feeling good." He denies SI or HI. He slept well and enjoyed his breakfast this morning. He continues to have tangential thought process talking about an instance he ate an entire apple with a piece of fabric from the couch, "Roe Rutherford being resurrected", and believing in "kings and queens and UFOs." He denies paranoia or any anxiety. He asks when he will be able to leave and arranging rides for when he does. He denies SI, HI, or AVH however states he talks to himself when the heater is on.  He denies any pain or side effects from his medications. No evidence of EPS during our evaluation.   Principal Problem: Paranoid schizophrenia ([place]) Diagnosis: Principal Problem:   Paranoid schizophrenia ([place])  Past Psychiatric History:  Previous Psych Diagnoses: Schizophrenia,  Schizoaffective disorder-bipolar type Prior inpatient treatment: [facility] 04/2018, [facility] 12/2021 Prior outpatient treatment: [facility] History of suicide: History of homicide: Denies Psychiatric medication history: In addition to above, Previously on 6/14 Zyprexa 5 mg qAM and 30 mg qHS; Prolixin 10 mg in 05/2020, Invega Sustenna, PO Invega, and Haldol (02/2020), Neurontin, Clozapine 9/23 (switched to invega LAI +haldol) Psychiatric medication compliance history: Reports compliance, and picking up medications appropriately, per pharmacy records Neuromodulation history: Denies Current Psychiatrist: [name], DO (ACTT services via [facility])  Past Medical History:  Past Medical History:  Diagnosis Date   Bipolar disorder ([place])    Bronchitis    Diabetes mellitus without complication ([place])    Hyperlipidemia    Paranoid schizophrenia ([place])    Schizophrenia ([place])     Past Surgical History:  Procedure Laterality Date   TESTICLE IMPLANTATION TO THIGH     TESTICLE SURGERY     Family History:  Family History  Problem Relation Age of Onset   Schizophrenia Neg Hx    Family Psychiatric  History: See H&P Social History: See H&P  Current Medications: Current Facility-Administered Medications  Medication Dose Route Frequency Provider Last Rate Last Admin   acetaminophen (TYLENOL) tablet 650 mg  650 mg Oral Q6H PRN Motley-Mangrum, Jadeka A, PMHNP   650 mg at 05/29/22 1346   alum & mag hydroxide-simeth (MAALOX/MYLANTA) 200-200-20 MG/5ML suspension 30 mL  30 mL Oral Q4H PRN Motley-Mangrum, Jadeka A, PMHNP       benztropine (COGENTIN) tablet 2 mg  2 mg Oral Q12H Goli, Veeraindar, MD   2 mg at 05/30/22 0726   divalproex (DEPAKOTE) DR tablet 500 mg  500 mg  Oral Q12H Motley-Mangrum, Jadeka A, PMHNP   500 mg at 05/30/22 0727   empagliflozin (JARDIANCE) tablet 25 mg  25 mg Oral QAC breakfast Motley-Mangrum, Jadeka A, PMHNP   25 mg at 05/30/22 0612   haloperidol (HALDOL) tablet 10 mg  10  mg Oral Barton Fanny, MD   10 mg at 05/30/22 4097   hydrOXYzine (ATARAX) tablet 25 mg  25 mg Oral TID PRN Motley-Mangrum, Jadeka A, PMHNP   25 mg at 05/24/22 2115   magnesium hydroxide (MILK OF MAGNESIA) suspension 30 mL  30 mL Oral Daily PRN Motley-Mangrum, Jadeka A, PMHNP       metFORMIN (GLUCOPHAGE) tablet 1,000 mg  1,000 mg Oral BID WC Motley-Mangrum, Jadeka A, PMHNP   1,000 mg at 05/30/22 3532   nicotine polacrilex (NICORETTE) gum 2 mg  2 mg Oral PRN France Ravens, MD   2 mg at 05/28/22 0915   paliperidone (INVEGA SUSTENNA) injection 234 mg  234 mg Intramuscular Once Merrily Brittle, DO       traZODone (DESYREL) tablet 150 mg  150 mg Oral QHS Attiah, Nadir, MD   150 mg at 05/29/22 2039   ziprasidone (GEODON) capsule 20 mg  20 mg Oral Q12H PRN Onuoha, Chinwendu V, NP        Lab Results:  Results for orders placed or performed during the hospital encounter of 05/24/22 (from the past 48 hour(s))  Valproic acid level     Status: Abnormal   Collection Time: 05/29/22  6:40 AM  Result Value Ref Range   Valproic Acid Lvl 39 (L) 50.0 - 100.0 ug/mL    Comment: Performed at [facility], [place] [address], [place], [place] [address]    Blood Alcohol level:  Lab Results  Component Value Date   [facility] <10 05/22/2022   ETH <10 68/34/1962    Metabolic Disorder Labs: Lab Results  Component Value Date   HGBA1C 5.4 05/25/2022   MPG 108.28 05/25/2022   MPG 151.33 11/17/2021   Lab Results  Component Value Date   PROLACTIN 8.1 06/01/2019   Lab Results  Component Value Date   CHOL 140 05/25/2022   TRIG 144 05/25/2022   HDL 34 (L) 05/25/2022   CHOLHDL 4.1 05/25/2022   VLDL 29 05/25/2022   LDLCALC 77 05/25/2022   LDLCALC 68 12/01/2021    Physical Findings: AIMS: Facial and Oral Movements Muscles of Facial Expression: None, normal Lips and Perioral Area: None, normal Jaw: None, normal Tongue: None, normal,Extremity Movements Upper (arms, wrists, hands, fingers): None,  normal Lower (legs, knees, ankles, toes): None, normal, Trunk Movements Neck, shoulders, hips: None, normal, Overall Severity Severity of abnormal movements (highest score from questions above): None, normal Incapacitation due to abnormal movements: None, normal Patient's awareness of abnormal movements (rate only patient's report): No Awareness, Dental Status Current problems with teeth and/or dentures?: No Does patient usually wear dentures?: No  CIWA:    COWS:     Musculoskeletal: Strength & Muscle Tone: within normal limits Gait & Station: normal Patient leans: N/A  Psychiatric Specialty Exam:  General Appearance: appears at stated age, wearing hospital scrubs  Behavior: pleasant and cooperative  Psychomotor Activity: no psychomotor agitation or retardation noted   Eye Contact: fair Speech: normal amount, tone, volume and fluency. Slower rate.    Mood: euthymic Affect: congruent, pleasant and interactive  Thought Process: Tangential thoughts/ loose association.  Descriptions of Associations: limited  Thought Content: Bizarre content present with delusions about religion and "resurrection" of celebrities.  Hallucinations: AH talking to god and [employer] through the [facility] vent, no VH , does not appear responding to stimuli Delusions: Delusions of grandeur and magical thinking present and ideas of reference. No paranoia, delusions of control, thought broadcasting.  Suicidal Thoughts: denies SI, intention, plan  Homicidal Thoughts: denies HI, intention, plan   Alertness/Orientation: alert and fully oriented x3. Not to situation.   Insight: limited Judgment: limited  Memory: intact  Executive Functions  Concentration: fair Attention Span: fair Recall: intact Fund of Knowledge: limited  Animal nutritionist; Social Support  Physical Exam: Constitutional:      Appearance: Normal appearance.  Cardiovascular:     Rate and Rhythm: Normal rate.  Pulmonary:      Effort: Pulmonary effort is normal.  Neurological:     General: No focal deficit present.     Mental Status: Alert and oriented to person, place, and time.   Review of Systems  Constitutional: Negative.  Negative for chills, fever and weight loss.  HENT: Negative.    Eyes: Negative.   Respiratory: Negative.    Cardiovascular: Negative.   Gastrointestinal:  Negative for constipation, diarrhea, nausea and vomiting.  Genitourinary: Negative.   Musculoskeletal: Negative.   Skin: Negative.   Neurological: Negative.  Negative for tingling.    ASSESSMENT:  Diagnoses / Active Problems: Principal Problem: Paranoid schizophrenia ([place]) Diagnosis: Principal Problem:   Paranoid schizophrenia ([place])   ASSESSMENT AND PLAN Bram Hottel is a 47 year old male with a reported past medical history of schizoaffective disorder, bipolar type, tobacco use disorder, dyslipidemia, and diabetes who presented to [facility] under [facility] for medication noncompliance and bizarre behavior such as pouring soda in heater, putting phone in toilet, and not sleeping.  This is hospitalization day 6.   PLAN Safety and Monitoring: Involuntary admission to inpatient psychiatric unit for safety, stabilization and treatment Daily contact with patient to assess and evaluate symptoms and progress in treatment Patient's case to be discussed in multi-disciplinary team meeting Observation Level : q15 minute checks Vital signs: q12 hours Precautions: suicide, elopement, and assault   Psychiatric Problems Schizoaffective Disorder, Bipolar Type Tobacco Use Disorder --Last Invega Sustenna 234 injection IM 05/25/22. Will provide next injection today 234.  --Currently on haldol 10 mg bid for psychosis.  -Cogentin was increased to 2 mg (from 1mg ) q12h yesterday for EPS prophylaxis given he is on 2 antipsychotics --Continue Depakote DR 500 mg bid for mood stabilization and impulsivity             -VPA level is 39 this on 1/31.  --NRT  with patch and gum --Continued trazodone 150 mg qSH - sleep --  The risks/benefits/side-effects/alternatives to this medication were discussed in detail with the patient and time was given for questions. The patient consents to medication trial.  -- Metabolic profile and EKG monitoring obtained while on an atypical antipsychotic (BMI: Body mass index is 34.17 kg/m. Lipid Panel: wnl HbgA1c: 5.4 QTc: 393) -- Encouraged patient to participate in unit milieu and in scheduled group therapies    3. Medical Management Diabetes Mellitus -Continue metformin 1000 mg bid with meals -Continue jardiance 25 mg daily   PRNs Tylenol 650 mg for mild pain Maalox/Mylanta 30 mL for indigestion Hydroxyzine 25 mg tid for anxiety Milk of Magnesia 30 mL for constipation nicotine polacrilex, 2 mg, PRN ziprasidone, 20 mg, Q12H PRN   4. Discharge Planning: Social work and case management to assist with discharge planning and identification of hospital follow-up needs prior to discharge Estimated LOS: 7  days Discharge Concerns: Need to establish a safety plan; Medication compliance and effectiveness Discharge Goals: Return home with outpatient referrals for mental health follow-up including medication management/psychotherapy  Total Time Spent in Direct Patient Care:  I personally spent 30 minutes on the unit in direct patient care. The direct patient care time included face-to-face time with the patient, reviewing the patient's chart, communicating with other professionals, and coordinating care. Greater than 50% of this time was spent in counseling or coordinating care with the patient regarding goals of hospitalization, psycho-education, and discharge planning needs.   Ezzard Flax, Medical Student 05/30/2022, 1:54 PM    __________________________ I was present for the entirety of the evaluation on 05/30/2022. I reviewed the patient's chart, and I was there during the entire interview. I discussed the case with  the medical student, and I agree with the assessment and plan of care as documented in the medical student's note. Case was discussed with attending as well.  Princess Bruins, DO PGY-2

## 2022-05-30 NOTE — Group Note (Signed)
Recreation Therapy Group Note   Group Topic:Health and Wellness  Group Date: 05/30/2022 Start Time: 1004 End Time: 1040 Facilitators: Brielynn Sekula-McCall, LRT,CTRS Location: [address] Dayroom   Goal Area(s) Addresses:  Patient will define components of whole wellness. Patient will verbalize benefit of whole wellness.  Group Description:  Mental Gymnastics.  LRT and patients discussed the components of wellness (mental, physical and spiritual).  LRT and patients also discussed the importance of wellness and how it affects [place] on a daily basis.  LRT then gave patients two worksheets of brain teasers.  LRT explained to patients instead of doing physical exercise, they were going to exercise their brains and solve the brain teasers presented on the worksheets.  Patients were given 20 minutes to decode as many of brain teasers they could before they went over them as a group.    Affect/Mood: Flat   Participation Level: None   Participation Quality: N/A   Behavior: Reserved   Speech/Thought Process: N/A   Insight: N/A   Judgement: N/A   Modes of Intervention: Worksheet   Patient Response to Interventions:  Disengaged   Education Outcome:  In group clarification offered    Clinical Observations/Individualized Feedback: Pt did not participate in group.  Pt came and sat down for a few minutes before leaving and not returning.     Plan: Continue to engage patient in RT group sessions 2-3x/week.   Demitria Hay-McCall, LRT,CTRS 05/30/2022 11:15 AM

## 2022-05-30 NOTE — Progress Notes (Signed)
Attempted to reach [name], patient's mother at [phone], to obtain further collateral information regarding patient's baseline mental status however was unable to reach her at this time. Will try again tomorrow.

## 2022-05-30 NOTE — Progress Notes (Signed)
[facility] Group Notes:  (Nursing/MHT/Case Management/Adjunct)  Date:  05/30/2022  Time:  2000  Type of Therapy:   wrap up group  Participation Level:  Active  Participation Quality:  Drowsy and Sharing  Affect:  Blunted  Cognitive:  Hallucinating  Insight:  Lacking  Engagement in Group:  Developing/Improving  Modes of Intervention:  Clarification, Education, and Support  Summary of Progress/Problems: Positive thinking and positive change were discussed.   [name] 05/30/2022, 8:30 PM

## 2022-05-30 NOTE — Progress Notes (Signed)
D- Patient alert and oriented. Denies SI, HI, AVH, and pain. Patient appeared to be responding to internal stimuli. Patient became agitated speaking about his mother and father and his mail being stolen, but was able to be verbally deescalated.   A- Scheduled medications administered to patient, per MAR. Support and encouragement provided.  Routine safety checks conducted every 15 minutes.  Patient informed to notify staff with problems or concerns.  R- No adverse drug reactions noted. Patient contracts for safety at this time. Patient compliant with medications and treatment plan. Patient receptive, calm, and cooperative. Patient interacts well with others on the unit.  Patient remains safe at this time.

## 2022-05-30 NOTE — Progress Notes (Signed)
Patient noted to be laughing at inappropriate times and talking to himself at times in the milleu and on the unit today.

## 2022-05-30 NOTE — Progress Notes (Signed)
[facility] LCSW Progress Note   05/30/2022    12:03 PM  [name]      Type of Note: Follow Up for Discharge Planning    CSW reached out to patient mom to inform her of patient anticipated DC for tomorrow. Mom was not too happy saying she did not know what to do with patient and how she was not feeling well herself to take care of him. CSW did explain to mom that patient ACTT team was already contacted on yesterday and they were helpful and provided CSW with information to provide mom with regarding alternative options of long-term care placement . Therefore, CSW informed mom to reach out to them to get further guidance and explained that as part of being patient legal guardian she would need to give permission for CSW to send patient elsewhere of her choice if  he was not allowed to return back with her. Mo was given CSW contact information and advised to call if she had an additional questions.     Signed:   Silas Flood, MSW, [facility] 05/30/2022 12:03 PM

## 2022-05-30 NOTE — Plan of Care (Signed)

## 2022-05-30 NOTE — Progress Notes (Signed)
Adult Psychoeducational Group Note  Date:  05/30/2022 Time:  9:43 AM  Group Topic/Focus:  Goals Group:   The focus of this group is to help patients establish daily goals to achieve during treatment and discuss how the patient can incorporate goal setting into their daily lives to aide in recovery.  Participation Level:  Active  Participation Quality:  Appropriate  Affect:  Appropriate  Cognitive:  Appropriate  Insight: Appropriate  Engagement in Group:  Engaged  Modes of Intervention:  Discussion  Additional Comments:  Patient attended morning orientation//goal's group and participated.  [name] W [name] 11/30/6657, 9:43 AM

## 2022-05-30 NOTE — Progress Notes (Signed)
   05/30/22 0546  15 Minute Checks  Location Hallway  Visual Appearance Calm  Behavior Composed  Sleep (Behavioral Health Patients Only)  Calculate sleep? (Click Yes once per 24 hr at 0600 safety check) Yes  Documented sleep last 24 hours 7.5

## 2022-05-31 ENCOUNTER — Encounter (HOSPITAL_COMMUNITY): Payer: Self-pay

## 2022-05-31 MED ORDER — WHITE PETROLATUM EX OINT
TOPICAL_OINTMENT | CUTANEOUS | Status: AC
  Filled 2022-05-31: qty 5

## 2022-05-31 NOTE — Group Note (Signed)
Recreation Therapy Group Note   Group Topic:Problem Solving  Group Date: 05/31/2022 Start Time: 1005 End Time: 1020 Facilitators: Lendon George-McCall, LRT,CTRS Location: [address] Dayroom   Goal Area(s) Addresses:  Patient will define components of whole wellness. Patient will verbalize benefit of whole wellness.  Group Description: Mental Gymnastics. LRT and patients discussed the components of wellness (mental, physical and spiritual). LRT and patients also discussed the importance of wellness and how it affects [place] on a daily basis. LRT then gave patients two worksheets of brain teasers. LRT explained to patients instead of doing physical exercise, they were going to exercise their brains and solve the brain teasers presented on the worksheets. Patients were given 20 minutes to decode as many of brain teasers they could before they went over them as a group     Affect/Mood: Flat   Participation Level: None   Participation Quality: None   Behavior: Appropriate   Speech/Thought Process: N/A   Insight: N/A   Judgement: N/A   Modes of Intervention: STEM Activity   Patient Response to Interventions:  N/A   Education Outcome:  In group clarification offered    Clinical Observations/Individualized Feedback: Pt did not participate.  Pt fell asleep during group.   Plan: Continue to engage patient in RT group sessions 2-3x/week.   Dylin Ihnen-McCall, LRT,CTRS 05/31/2022 11:42 AM

## 2022-05-31 NOTE — Progress Notes (Signed)
   05/31/22 9292  15 Minute Checks  Location Bedroom  Visual Appearance Calm  Behavior Composed  Sleep (Behavioral Health Patients Only)  Calculate sleep? (Click Yes once per 24 hr at 0600 safety check) Yes  Documented sleep last 24 hours 6.25

## 2022-05-31 NOTE — Progress Notes (Signed)
Adult Psychoeducational Group Note  Date:  05/31/2022 Time:  8:59 PM  Group Topic/Focus:  Wrap-Up Group:   The focus of this group is to help patients review their daily goal of treatment and discuss progress on daily workbooks.  Participation Level:  Active  Participation Quality:  Appropriate  Affect:  Appropriate  Cognitive:  Appropriate  Insight: Appropriate  Engagement in Group:  Improving  Modes of Intervention:  Discussion  Additional Comments:  Pt stated his goal for today was to focus on his treatment plan and discuss his discharge plan with his doctor. Pt stated he accomplished his goal today. Pt stated he talked with his doctor and social worker about his care today. Pt rated his overall day a 9 out of 10. Pt stated the plan is for him to discharge on 06/02/2022. Pt stated he was able to talk with his mother today, which improved his overall day. Pt stated he felt better about himself today. Pt stated he was able to attend all meals. Pt stated he took all medications provided today. Pt stated he attend all groups held today. Pt stated his appetite was pretty good today. Pt rated sleep last night was fair. Pt stated the goal tonight was to get some rest. Pt stated he had no physical pain tonight. Pt deny visual hallucinations and auditory issues tonight. Pt denies thoughts of harming himself or others. Pt stated he would alert staff if anything changed  Candy Sledge 05/31/2022, 8:59 PM

## 2022-05-31 NOTE — BH IP Treatment Plan (Signed)
Interdisciplinary Treatment and Diagnostic Plan Update  05/31/2022 Time of Session: [place] MRN: [id number]  Principal Diagnosis: Paranoid schizophrenia [facility])  Secondary Diagnoses: Principal Problem:   Paranoid schizophrenia ([place])   Current Medications:  Current Facility-Administered Medications  Medication Dose Route Frequency Provider Last Rate Last Admin   acetaminophen (TYLENOL) tablet 650 mg  650 mg Oral Q6H PRN Motley-Mangrum, Jadeka A, PMHNP   650 mg at 05/29/22 1346   alum & mag hydroxide-simeth (MAALOX/MYLANTA) 200-200-20 MG/5ML suspension 30 mL  30 mL Oral Q4H PRN Motley-Mangrum, Jadeka A, PMHNP       benztropine (COGENTIN) tablet 2 mg  2 mg Oral Q12H Goli, Veeraindar, MD   2 mg at 05/31/22 0800   divalproex (DEPAKOTE) DR tablet 500 mg  500 mg Oral Q12H Motley-Mangrum, Jadeka A, PMHNP   500 mg at 05/31/22 0800   empagliflozin (JARDIANCE) tablet 25 mg  25 mg Oral QAC breakfast Motley-Mangrum, Jadeka A, PMHNP   25 mg at 05/31/22 0600   haloperidol (HALDOL) tablet 10 mg  10 mg Oral Q12H France Ravens, MD   10 mg at 05/31/22 0800   hydrOXYzine (ATARAX) tablet 25 mg  25 mg Oral TID PRN Motley-Mangrum, Jadeka A, PMHNP   25 mg at 05/24/22 2115   magnesium hydroxide (MILK OF MAGNESIA) suspension 30 mL  30 mL Oral Daily PRN Motley-Mangrum, Jadeka A, PMHNP       metFORMIN (GLUCOPHAGE) tablet 1,000 mg  1,000 mg Oral BID WC Motley-Mangrum, Jadeka A, PMHNP   1,000 mg at 05/31/22 0800   nicotine polacrilex (NICORETTE) gum 2 mg  2 mg Oral PRN France Ravens, MD   2 mg at 05/28/22 0915   traZODone (DESYREL) tablet 150 mg  150 mg Oral QHS Winfred Leeds, Nadir, MD   150 mg at 05/30/22 2055   ziprasidone (GEODON) capsule 20 mg  20 mg Oral Q12H PRN Onuoha, Chinwendu V, NP       PTA Medications: Medications Prior to Admission  Medication Sig Dispense Refill Last Dose   atorvastatin (LIPITOR) 40 MG tablet Take 1 tablet (40 mg total) by mouth at bedtime. (Patient not taking: Reported on 05/22/2022) 30  tablet 0    cloZAPine (CLOZARIL) 50 MG tablet Take 3 tablets (150 mg total) by mouth at bedtime for 28 days. 84 tablet 0    divalproex (DEPAKOTE) 500 MG DR tablet Take 1 tablet (500 mg total) by mouth every 12 (twelve) hours. (Patient taking differently: Take 1,000 mg by mouth at bedtime.) 60 tablet 0    empagliflozin (JARDIANCE) 25 MG TABS tablet Take 1 tablet (25 mg total) by mouth daily before breakfast. (Patient not taking: Reported on 05/22/2022) 30 tablet 2    haloperidol (HALDOL) 10 MG tablet Take 1 tablet (10 mg total) by mouth every 12 (twelve) hours. (Patient not taking: Reported on 05/22/2022) 60 tablet 0    INVEGA SUSTENNA 234 MG/1.5ML injection Inject into the muscle.      metFORMIN (GLUCOPHAGE) 1000 MG tablet Take 1 tablet (1,000 mg total) by mouth 2 (two) times daily with a meal. (Patient not taking: Reported on 05/22/2022) 60 tablet 2    SYNJARDY XR 01-999 MG TB24 Take by mouth. (Patient not taking: Reported on 05/22/2022)      temazepam (RESTORIL) 15 MG capsule Take 1 capsule (15 mg total) by mouth at bedtime as needed for up to 14 days for sleep. (Patient not taking: Reported on 04/08/2022) 14 capsule 0    traZODone (DESYREL) 100 MG tablet Take 1  tablet (100 mg total) by mouth at bedtime as needed for sleep. (Patient not taking: Reported on 05/22/2022) 30 tablet 2     Patient Stressors: Financial difficulties   Medication change or noncompliance    Patient Strengths: Capable of independent living  Supportive family/friends   Treatment Modalities: Medication Management, Group therapy, Case management,  1 to 1 session with clinician, Psychoeducation, Recreational therapy.   Physician Treatment Plan for Primary Diagnosis: Paranoid schizophrenia ([place]) Long Term Goal(s):     Short Term Goals:    Medication Management: Evaluate patient's response, side effects, and tolerance of medication regimen.  Therapeutic Interventions: 1 to 1 sessions, Unit Group sessions and Medication  administration.  Evaluation of Outcomes: Progressing  Physician Treatment Plan for Secondary Diagnosis: Principal Problem:   Paranoid schizophrenia ([place])  Long Term Goal(s):     Short Term Goals:       Medication Management: Evaluate patient's response, side effects, and tolerance of medication regimen.  Therapeutic Interventions: 1 to 1 sessions, Unit Group sessions and Medication administration.  Evaluation of Outcomes: Progressing   RN Treatment Plan for Primary Diagnosis: Paranoid schizophrenia ([place]) Long Term Goal(s): Knowledge of disease and therapeutic regimen to maintain health will improve  Short Term Goals: Ability to remain free from injury will improve, Ability to verbalize frustration and anger appropriately will improve, Ability to demonstrate self-control, Ability to participate in decision making will improve, Ability to verbalize feelings will improve, Ability to disclose and discuss suicidal ideas, Ability to identify and develop effective coping behaviors will improve, and Compliance with prescribed medications will improve  Medication Management: RN will administer medications as ordered by provider, will assess and evaluate patient's response and provide education to patient for prescribed medication. RN will report any adverse and/or side effects to prescribing provider.  Therapeutic Interventions: 1 on 1 counseling sessions, Psychoeducation, Medication administration, Evaluate responses to treatment, Monitor vital signs and CBGs as ordered, Perform/monitor CIWA, COWS, AIMS and Fall Risk screenings as ordered, Perform wound care treatments as ordered.  Evaluation of Outcomes: Progressing   LCSW Treatment Plan for Primary Diagnosis: Paranoid schizophrenia ([place]) Long Term Goal(s): Safe transition to appropriate next level of care at discharge, Engage patient in therapeutic group addressing interpersonal concerns.  Short Term Goals: Engage patient in aftercare  planning with referrals and resources, Increase social support, Increase ability to appropriately verbalize feelings, Increase emotional regulation, Facilitate acceptance of mental health diagnosis and concerns, Facilitate patient progression through stages of change regarding substance use diagnoses and concerns, Identify triggers associated with mental health/substance abuse issues, and Increase skills for wellness and recovery  Therapeutic Interventions: Assess for all discharge needs, 1 to 1 time with Social worker, Explore available resources and support systems, Assess for adequacy in community support network, Educate family and significant other(s) on suicide prevention, Complete Psychosocial Assessment, Interpersonal group therapy.  Evaluation of Outcomes: Progressing   Progress in Treatment: Attending groups: Yes. Participating in groups: Yes. Taking medication as prescribed: Yes. Toleration medication: Yes. Family/Significant other contact made: Yes, individual(s) contacted:  Legal/Guardian/mother [name] [phone] Patient understands diagnosis: No. Discussing patient identified problems/goals with staff: Yes. Medical problems stabilized or resolved: Yes. Denies suicidal/homicidal ideation: Yes. Issues/concerns per patient self-inventory: Yes. Other:   New problem(s) identified: No, Describe:  None reported  New Short Term/Long Term Goal(s): stabilization, elimination of SI thoughts, development of comprehensive mental wellness plan.  medication   Patient Goals:    Discharge Plan or Barriers: Patient recently admitted. CSW will continue to follow and assess  for appropriate referrals and possible discharge planning.   Reason for Continuation of Hospitalization: Delusions  Hallucinations Medication stabilization  Estimated Length of Stay: 3-7 Days  Last [place] Suicide Severity Risk Score: [place] Admission (Current) from 05/24/2022 in [facility] ED from 05/22/2022 in [facility] Emergency Department at [facility] ED from 05/09/2022 in [facility] Emergency Department at [place] No Risk No Risk No Risk       Last PHQ 2/9 Scores:    11/02/2021    6:10 PM  Depression screen PHQ 2/9  Decreased Interest 1  Down, Depressed, Hopeless 0  PHQ - 2 Score 1     medication stabilization, elimination of SI thoughts, development of comprehensive mental wellness plan.   Scribe for Treatment Team: Windle Guard, LCSW 05/31/2022 1:53 PM

## 2022-05-31 NOTE — B[facility] Group Notes (Signed)
Spirituality group facilitated by Kathrynn Humble, [place].  Group Description: Group focused on topic of hope. Patients participated in facilitated discussion around topic, connecting with one another around experiences and definitions for hope. Group members engaged with visual explorer photos, reflecting on what hope looks like for them today. Group engaged in discussion around how their definitions of hope are present today in hospital.  Modalities: Psycho-social ed, Adlerian, Narrative, MI  Patient Progress: [name] attended group and participated in some of the conversation and group activities, but he was very drowsy and fell asleep for part of the group.  [address], [place] Pager, [phone]

## 2022-05-31 NOTE — Progress Notes (Signed)
[facility] MD Progress Note  05/31/2022 10:14 AM [name]  MRN:  [id number]   Reason for Admission:  [name] is a 47 y.o. male with a history of schizophrenia-paranoid, bipolar disorder housing insecurity, who was initially admitted for inpatient psychiatric hospitalization on 05/24/2022 for management of medication noncompliance and bizarre behavior such as pouring soda in heater, putting phone in toilet, and not sleeping. The patient is currently on Hospital Day 7.   Chart Review from last 24 hours:  The patient's chart was reviewed and nursing notes were reviewed. The patient's case was discussed in multidisciplinary team meeting. Per [employer], patient was taking medications appropriately. Per nursing, patient is calm and cooperative and attended 2 groups. Patient received the following PRN medications: N/A Per RN, patient was responding internally and laughing inappropriately. Patient became agitated but did not require agitation PRNs. Otherwise he was still pleasantly psychotic   Information Obtained Today During Patient Interview: The patient was seen and evaluated on the unit. On assessment today the patient reports feeling good overall. He woke up 3 times overnight after having dreams that he was a superhero however denies any nightmares. He denies SI, HI, VH this morning. He is able to answer questions more clearly today however continues to have some disorganized thoughts. Patient denied AH, however he continues to talk to himself and "speak with god every day" and he speaks back saying "I'm his son" but denies any voices telling him to do anything in particular. He tolerated his injection yesterday well without difficulty. He continues to have some muscle cramping more notably when walking but none at the moment. He endorsed delusions, when talking about his muscle cramps he reported "I asked god to not make me a giant so I can fit into cars." Reported thought insertion, saying he is getting  waves from [employer]. Denied thought broadcasting. No ideas of reference since yesterday. He denies any other complaints today. No anxious thoughts, other pain, or concerns.   Principal Problem: Paranoid schizophrenia ([place]) Diagnosis: Principal Problem:   Paranoid schizophrenia ([place])  Past Psychiatric History:  Previous Psych Diagnoses: Schizophrenia, Schizoaffective disorder-bipolar type Prior inpatient treatment: [facility] 04/2018, [facility] 12/2021 Prior outpatient treatment: [facility] History of suicide: History of homicide: Denies Psychiatric medication history: In addition to above, Previously on 6/14 Zyprexa 5 mg qAM and 30 mg qHS; Prolixin 10 mg in 05/2020, Invega Sustenna, PO Invega, and Haldol (02/2020), Neurontin, Clozapine 9/23 (switched to invega LAI +haldol) Psychiatric medication compliance history: Reports compliance, and picking up medications appropriately, per pharmacy records Neuromodulation history: Denies Current Psychiatrist: [name], DO ([facility] via [facility])  Past Medical History:  Past Medical History:  Diagnosis Date   Bipolar disorder ([place])    Bronchitis    Diabetes mellitus without complication ([place])    Hyperlipidemia    Paranoid schizophrenia ([place])    Schizophrenia ([place])     Past Surgical History:  Procedure Laterality Date   TESTICLE IMPLANTATION TO THIGH     TESTICLE SURGERY     Family History:  Family History  Problem Relation Age of Onset   Schizophrenia Neg Hx    Family Psychiatric  History:  N/A Social History:  See H&P  Current Medications: Current Facility-Administered Medications  Medication Dose Route Frequency Provider Last Rate Last Admin   acetaminophen (TYLENOL) tablet 650 mg  650 mg Oral Q6H PRN Motley-Mangrum, Jadeka A, PMHNP   650 mg at 05/29/22 1346   alum & mag hydroxide-simeth (MAALOX/MYLANTA) 200-200-20  MG/5ML suspension 30 mL  30 mL Oral Q4H PRN Motley-Mangrum, Jadeka A, PMHNP       benztropine (COGENTIN)  tablet 2 mg  2 mg Oral Q12H Goli, Veeraindar, MD   2 mg at 05/31/22 0800   divalproex (DEPAKOTE) DR tablet 500 mg  500 mg Oral Q12H Motley-Mangrum, Jadeka A, PMHNP   500 mg at 05/31/22 0800   empagliflozin (JARDIANCE) tablet 25 mg  25 mg Oral QAC breakfast Motley-Mangrum, Jadeka A, PMHNP   25 mg at 05/31/22 0600   haloperidol (HALDOL) tablet 10 mg  10 mg Oral Threasa Alpha, MD   10 mg at 05/31/22 0800   hydrOXYzine (ATARAX) tablet 25 mg  25 mg Oral TID PRN Motley-Mangrum, Jadeka A, PMHNP   25 mg at 05/24/22 2115   magnesium hydroxide (MILK OF MAGNESIA) suspension 30 mL  30 mL Oral Daily PRN Motley-Mangrum, Jadeka A, PMHNP       metFORMIN (GLUCOPHAGE) tablet 1,000 mg  1,000 mg Oral BID WC Motley-Mangrum, Jadeka A, PMHNP   1,000 mg at 05/31/22 0800   nicotine polacrilex (NICORETTE) gum 2 mg  2 mg Oral PRN Park Pope, MD   2 mg at 05/28/22 0915   traZODone (DESYREL) tablet 150 mg  150 mg Oral QHS Attiah, Nadir, MD   150 mg at 05/30/22 2055   ziprasidone (GEODON) capsule 20 mg  20 mg Oral Q12H PRN Onuoha, Chinwendu V, NP        Lab Results: No results found for this or any previous visit (from the past 48 hour(s)).  Blood Alcohol level:  Lab Results  Component Value Date   ETH <10 05/22/2022   ETH <10 04/08/2022    Metabolic Disorder Labs: Lab Results  Component Value Date   HGBA1C 5.4 05/25/2022   MPG 108.28 05/25/2022   MPG 151.33 11/17/2021   Lab Results  Component Value Date   PROLACTIN 8.1 06/01/2019   Lab Results  Component Value Date   CHOL 140 05/25/2022   TRIG 144 05/25/2022   HDL 34 (L) 05/25/2022   CHOLHDL 4.1 05/25/2022   VLDL 29 05/25/2022   LDLCALC 77 05/25/2022   LDLCALC 68 12/01/2021    Physical Findings: AIMS: Facial and Oral Movements Muscles of Facial Expression: None, normal Lips and Perioral Area: None, normal Jaw: None, normal Tongue: None, normal,Extremity Movements Upper (arms, wrists, hands, fingers): None, normal Lower (legs, knees, ankles,  toes): None, normal, Trunk Movements Neck, shoulders, hips: None, normal, Overall Severity Severity of abnormal movements (highest score from questions above): None, normal Incapacitation due to abnormal movements: None, normal Patient's awareness of abnormal movements (rate only patient's report): No Awareness, Dental Status Current problems with teeth and/or dentures?: No Does patient usually wear dentures?: No  CIWA:    COWS:     Musculoskeletal: Strength & Muscle Tone: within normal limits Gait & Station: normal Patient leans: N/A  Psychiatric Specialty Exam:  General Appearance: appears at stated age, wearing hospital scrubs  Behavior: pleasant and cooperative  Psychomotor Activity: no psychomotor agitation or retardation noted   Eye Contact: fair Speech: normal amount, tone, volume and fluency. Decreased rate.    Mood: euthymic Affect: congruent, pleasant and interactive  Thought Process: linear, goal directed, no circumstantial or tangential thought process noted, no racing thoughts or flight of ideas Descriptions of Associations: intact Thought Content: Some bizarre content. Less compared to prior exam.  Hallucinations: denies AH, VH. Responding to internal stimuli at times.  Delusions: Some magical thinking and grandeur, no  paranoia, delusions of control, ideas of reference, thought broadcasting Suicidal Thoughts: denies SI, intention, plan  Homicidal Thoughts: denies HI, intention, plan   Alertness/Orientation: alert and fully oriented  Insight: limited Judgment: limited  Memory: fair  Executive Functions  Concentration: intact  Attention Span: fair Recall: intact Fund of Knowledge: limited  Animal nutritionist; Social Support  Physical Exam Constitutional:      Appearance: Normal appearance.  Cardiovascular:     Rate and Rhythm: Normal rate.  Pulmonary:     Effort: Pulmonary effort is normal.  Neurological:     General: No focal  deficit present.     Mental Status: Alert and oriented to person, place, and time.    Review of Systems  Constitutional: Negative.  Negative for chills, fever and weight loss.  HENT: Negative.    Eyes: Negative.   Respiratory: Negative.    Cardiovascular: Negative.   Gastrointestinal:  Negative for constipation, diarrhea, nausea and vomiting.  Genitourinary: Negative.   Musculoskeletal: Negative.   Skin: Negative.   Neurological: Negative.  Negative for tingling.    ASSESSMENT AND PLAN [name] is a 47 year old male with a reported past medical history of schizoaffective disorder, bipolar type, tobacco use disorder, dyslipidemia, and diabetes who presented to [facility] under [facility] for medication noncompliance and bizarre behavior such as pouring soda in heater, putting phone in toilet, and not sleeping.  This is hospitalization day 7.   PLAN Safety and Monitoring: Involuntary admission to inpatient psychiatric unit for safety, stabilization and treatment Daily contact with patient to assess and evaluate symptoms and progress in treatment Patient's case to be discussed in multi-disciplinary team meeting Observation Level : q15 minute checks Vital signs: q12 hours Precautions: suicide, elopement, and assault   Psychiatric Problems Schizoaffective Disorder, Bipolar Type Tobacco Use Disorder --Received two Invega injections IM during this admission (1/27 and 2/1)  --Also on haldol 10 mg bid for psychosis.  -Continue Cogentin 2 mg q12h for EPS prophylaxis given he is on 2 antipsychotics --Continue Depakote DR 500 mg bid for mood stabilization and impulsivity             -VPA level is 39 on 1/31.  --NRT with patch and gum --Continued trazodone 150 mg qSH - sleep --  The risks/benefits/side-effects/alternatives to this medication were discussed in detail with the patient and time was given for questions. The patient consents to medication trial.  -- Metabolic profile and EKG monitoring  obtained while on an atypical antipsychotic (BMI: Body mass index is 34.17 kg/m. Lipid Panel: wnl HbgA1c: 5.4 QTc: 393) -- Encouraged patient to participate in unit milieu and in scheduled group therapies    3. Medical Management Diabetes Mellitus -Continue metformin 1000 mg bid with meals -Continue jardiance 25 mg daily   PRNs Tylenol 650 mg for mild pain Maalox/Mylanta 30 mL for indigestion Hydroxyzine 25 mg tid for anxiety Milk of Magnesia 30 mL for constipation nicotine polacrilex, 2 mg, PRN ziprasidone, 20 mg, Q12H PRN   4. Discharge Planning: Social work and case management to assist with discharge planning and identification of hospital follow-up needs prior to discharge Estimated LOS: 10 days Discharge Concerns: Need to establish a safety plan; Medication compliance and effectiveness Discharge Goals: Return home with outpatient referrals for mental health follow-up including medication management/psychotherapy     Clydell Hakim, Medical Student  05/31/2022, 10:14 AM   ______________________________________ I was present for the entirety of the evaluation on 05/31/2022. I reviewed the patient's chart, and I participated in key  portions of the service. I discussed the case with the medical student and attending, and I agree with the assessment and plan of care as documented in the medical student's note.   Patient is less disorganized than yesterday, he is able to hold a conversation for 2-3 questions before he derails, flight of ideas. He is interruptable. He continues to report delusions, AH, thought insertion. No VH, thought broadcasting, ideas of reference today. He continues to be pleasantly psychotic.  Merrily Brittle, DO PGY2

## 2022-05-31 NOTE — Progress Notes (Signed)
   05/31/22 1100  Psych Admission Type (Psych Patients Only)  Admission Status Involuntary  Psychosocial Assessment  Patient Complaints None  Eye Contact Fair  Facial Expression Animated  Affect Preoccupied  Speech Tangential  Interaction Assertive  Motor Activity Pacing  Appearance/Hygiene Unremarkable  Behavior Characteristics Cooperative  Mood Preoccupied  Thought Process  Coherency Tangential  Content Paranoia  Delusions None reported or observed  Perception Hallucinations  Hallucination Auditory  Judgment Impaired  Confusion None  Danger to Self  Current suicidal ideation? Denies  Danger to Others  Danger to Others None reported or observed

## 2022-06-01 NOTE — Progress Notes (Signed)
   05/31/22 2230  Psych Admission Type (Psych Patients Only)  Admission Status Involuntary  Psychosocial Assessment  Patient Complaints None  Eye Contact Brief  Facial Expression Flat  Affect Preoccupied  Speech Logical/coherent  Interaction Assertive  Motor Activity Slow  Appearance/Hygiene Unremarkable  Behavior Characteristics Cooperative;Appropriate to situation  Mood Pleasant;Preoccupied  Thought Process  Coherency Tangential  Content WDL  Delusions None reported or observed  Perception Hallucinations  Hallucination Auditory  Judgment Poor  Confusion None  Danger to Self  Current suicidal ideation? Denies
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
## 2022-06-01 NOTE — Brief Op Note (Incomplete)
*   No surgery found *  10:18 AM  PATIENT:  [name]  47 y.o. male  PRE-OPERATIVE DIAGNOSIS:  * No surgery found *  POST-OPERATIVE DIAGNOSIS:  * No surgery found *  PROCEDURE:  * No surgery found *  SURGEON:  * Surgery not found *  PHYSICIAN ASSISTANT:   ASSISTANTS: {ASSISTANTS:31801}   ANESTHESIA:   {procedures; anesthesia:812}  EBL:  * No surgery found *   BLOOD ADMINISTERED:{BLOOD GIVEN TYPES AND AMOUNTS:20467}  DRAINS: {Devices; drains:31758}   LOCAL MEDICATIONS USED:  {LOCAL MEDICATIONS:10721995}  SPECIMEN:  {ONC STAGING; AJCC TYPE OF SPECIMEN:115600001}  DISPOSITION OF SPECIMEN:  {SPECIMEN DISPOSITION:204680}  COUNTS:  {OR COUNTS CORRECT/INCORRECT:204690}  TOURNIQUET:  * No surgery found *  DICTATION: .{DICTATION PPIRJ:1884166063}  PLAN OF CARE: {OPTIME PLAN OF KZSW:1093235573}  PATIENT DISPOSITION:  {op note disposition:31782}   Delay start of Pharmacological VTE agent (>24hrs) due to surgical blood loss or risk of bleeding: {YES/NO/NOT APPLICABLE:20182}

## 2022-06-01 NOTE — Progress Notes (Signed)
[name] Progress Note  06/01/2022 8:03 AM [name]  MRN:  [id number]   Reason for Admission:  [name] is a 47 y.o. male with a history of schizophrenia-paranoid, bipolar disorder housing insecurity, who was initially admitted for inpatient psychiatric hospitalization on 05/24/2022 for management of medication noncompliance and bizarre behavior such as pouring soda in heater, putting phone in toilet, and not sleeping. The patient is currently on Hospital Day 8.   Chart Review from last 24 hours:  The patient's chart was reviewed and nursing notes were reviewed. The patient's case was discussed in multidisciplinary team meeting. Per [facility], patient was taking medications appropriately. Per nursing, patient is calm and cooperative and attended groups. Patient received the following PRN medications: N/A Per RN, patient was responding internally and laughing inappropriately. Patient became agitated but did not require agitation PRNs. Otherwise he was still pleasantly psychotic   Information Obtained Today During Patient Interview: The patient was seen and evaluated on the unit. On assessment today the patient reports feeling good overall. He reports sleeping well and intact appetite. He denies SI, HI, and paranoia this morning. He is having auditory hallucinations of voices coming through the air vent in his room as well as VH of seeing the face of [facility] or a cat in a bowtie outside of his window. He is able to answer questions more clearly today but continues to have some disorganized thoughts. He tolerated his injection 2/1 well without difficulty. He denies somatic concerns and complaints today. Denied thought broadcasting. No ideas of reference. He denies any other complaints today. No anxious thoughts, other pain, or concerns. Patient is aware that he needs a safe disposition plan but is unsure as to what that will be at the time.  Principal Problem: Paranoid schizophrenia  ([place]) Diagnosis: Principal Problem:   Paranoid schizophrenia ([place])  Past Psychiatric History:  Previous Psych Diagnoses: Schizophrenia, Schizoaffective disorder-bipolar type Prior inpatient treatment: [facility] 04/2018, [facility] 12/2021 Prior outpatient [facility]: [facility] History of suicide: History of homicide: Denies Psychiatric medication history: In addition to above, Previously on 6/14 Zyprexa 5 mg qAM and 30 mg qHS; Prolixin 10 mg in 05/2020, Invega Sustenna, PO Invega, and Haldol (02/2020), Neurontin, Clozapine 9/23 (switched to invega LAI +haldol) Psychiatric medication compliance history: Reports compliance, and picking up medications appropriately, per pharmacy records Neuromodulation history: Denies Current Psychiatrist: [name], DO (ACTT services via [facility])  Past Medical History:  Past Medical History:  Diagnosis Date   Bipolar disorder ([place])    Bronchitis    Diabetes mellitus without complication ([place])    Hyperlipidemia    Paranoid schizophrenia ([place])    Schizophrenia ([place])     Past Surgical History:  Procedure Laterality Date   TESTICLE IMPLANTATION TO THIGH     TESTICLE SURGERY     Family History:  Family History  Problem Relation Age of Onset   Schizophrenia Neg Hx    Family Psychiatric  History:  N/A Social History:  See H&P  Current Medications: Current Facility-Administered Medications  Medication Dose Route Frequency Provider Last Rate Last Admin   acetaminophen (TYLENOL) tablet 650 mg  650 mg Oral Q6H PRN Motley-Mangrum, Jadeka A, PMHNP   650 mg at 06/01/22 0146   alum & mag hydroxide-simeth (MAALOX/MYLANTA) 200-200-20 MG/5ML suspension 30 mL  30 mL Oral Q4H PRN Motley-Mangrum, Jadeka A, PMHNP       benztropine (COGENTIN) tablet 2 mg  2 mg Oral Q12H Ranae Palms, [name]   2  mg at 06/01/22 0755   divalproex (DEPAKOTE) DR tablet 500 mg  500 mg Oral Q12H Motley-Mangrum, Jadeka A, PMHNP   500 mg at 06/01/22 0756   empagliflozin  (JARDIANCE) tablet 25 mg  25 mg Oral QAC breakfast Motley-Mangrum, Jadeka A, PMHNP   25 mg at 06/01/22 0600   haloperidol (HALDOL) tablet 10 mg  10 mg Oral Barton Fanny, [name]   10 mg at 06/01/22 0755   hydrOXYzine (ATARAX) tablet 25 mg  25 mg Oral TID PRN Motley-Mangrum, Jadeka A, PMHNP   25 mg at 06/01/22 0146   magnesium hydroxide (MILK OF MAGNESIA) suspension 30 mL  30 mL Oral Daily PRN Motley-Mangrum, Jadeka A, PMHNP       metFORMIN (GLUCOPHAGE) tablet 1,000 mg  1,000 mg Oral BID WC Motley-Mangrum, Jadeka A, PMHNP   1,000 mg at 06/01/22 0756   nicotine polacrilex (NICORETTE) gum 2 mg  2 mg Oral PRN France Ravens, [name]   2 mg at 05/28/22 0915   traZODone (DESYREL) tablet 150 mg  150 mg Oral QHS Attiah, Nadir, [name]   150 mg at 05/31/22 2115   ziprasidone (GEODON) capsule 20 mg  20 mg Oral Q12H PRN Onuoha, Chinwendu V, NP        Lab Results: No results found for this or any previous visit (from the past 48 hour(s)).  Blood Alcohol level:  Lab Results  Component Value Date   ETH <10 05/22/2022   ETH <10 61/60/7371    Metabolic Disorder Labs: Lab Results  Component Value Date   HGBA1C 5.4 05/25/2022   MPG 108.28 05/25/2022   MPG 151.33 11/17/2021   Lab Results  Component Value Date   PROLACTIN 8.1 06/01/2019   Lab Results  Component Value Date   CHOL 140 05/25/2022   TRIG 144 05/25/2022   HDL 34 (L) 05/25/2022   CHOLHDL 4.1 05/25/2022   VLDL 29 05/25/2022   LDLCALC 77 05/25/2022   LDLCALC 68 12/01/2021    Physical Findings: AIMS: Facial and Oral Movements Muscles of Facial Expression: None, normal Lips and Perioral Area: None, normal Jaw: None, normal Tongue: None, normal,Extremity Movements Upper (arms, wrists, hands, fingers): None, normal Lower (legs, knees, ankles, toes): None, normal, Trunk Movements Neck, shoulders, hips: None, normal, Overall Severity Severity of abnormal movements (highest score from questions above): None, normal Incapacitation due to abnormal  movements: None, normal Patient's awareness of abnormal movements (rate only patient's report): No Awareness, Dental Status Current problems with teeth and/or dentures?: No Does patient usually wear dentures?: No  CIWA:    COWS:     Musculoskeletal: Strength & Muscle Tone: within normal limits Gait & Station: normal Patient leans: N/A  Psychiatric Specialty Exam:  General Appearance: appears at stated age, wearing hospital scrub top over casual clothing  Behavior: pleasant and cooperative  Psychomotor Activity: no psychomotor agitation or retardation noted   Eye Contact: fair Speech: normal amount, tone, volume and fluency. Decreased rate.    Mood: euthymic Affect: congruent, pleasant and interactive  Thought Process: linear, goal directed, no circumstantial or tangential thought process noted, no racing thoughts or flight of ideas Descriptions of Associations: intact Thought Content: Some bizarre content. Less compared to prior exam.  Hallucinations: denies AH, VH. Responding to internal stimuli at times.  Delusions: Some magical thinking and grandeur, no paranoia, delusions of control, ideas of reference, thought broadcasting Suicidal Thoughts: denies SI, intention, plan  Homicidal Thoughts: denies HI, intention, plan   Alertness/Orientation: alert and fully oriented  Insight: limited  Judgment: limited  Memory: fair  Executive Functions  Concentration: intact  Attention Span: fair Recall: intact Fund of Knowledge: limited  Animal nutritionist; Social Support  Physical Exam Constitutional:      Appearance: Normal appearance.  Cardiovascular:     Rate and Rhythm: Normal rate.  Pulmonary:     Effort: Pulmonary effort is normal.  Neurological:     General: No focal deficit present.     Mental Status: Alert and oriented to person, place, and time.    Review of Systems  Constitutional: Negative.  Negative for chills, fever and weight loss.  HENT:  Negative.    Eyes: Negative.   Respiratory: Negative.    Cardiovascular: Negative.   Gastrointestinal:  Negative for constipation, diarrhea, nausea and vomiting.  Genitourinary: Negative.   Musculoskeletal: Negative.   Skin: Negative.   Neurological: Negative.  Negative for tingling.    ASSESSMENT AND PLAN Dushawn Pusey is a 47 year old male with a reported past medical history of schizoaffective disorder, bipolar type, tobacco use disorder, dyslipidemia, and diabetes who presented to [facility] under [facility] for medication noncompliance and bizarre behavior such as pouring soda in heater, putting phone in toilet, and not sleeping. Patient has been tolerating medications well and appears to be at psychiatric baseline. He is stable for discharge, pending safe disposition plan.   PLAN Safety and Monitoring: Involuntary admission to inpatient psychiatric unit for safety, stabilization and treatment Daily contact with patient to assess and evaluate symptoms and progress in treatment Patient's case to be discussed in multi-disciplinary team meeting Observation Level : q15 minute checks Vital signs: q12 hours Precautions: suicide, elopement, and assault   Psychiatric Problems Schizoaffective Disorder, Bipolar Type Tobacco Use Disorder --Received two Invega injections IM during this admission (1/27 and 2/1)  --Also on haldol 10 mg bid for psychosis.  -Continue Cogentin 2 mg q12h for EPS prophylaxis given he is on 2 antipsychotics --Continue Depakote DR 500 mg bid for mood stabilization and impulsivity             -VPA level is 39 on 1/31.  --NRT with patch and gum --Continued trazodone 150 mg qSH - sleep --  The risks/benefits/side-effects/alternatives to this medication were discussed in detail with the patient and time was given for questions. The patient consents to medication trial.  -- Metabolic profile and EKG monitoring obtained while on an atypical antipsychotic (BMI: Body mass index is 34.17  kg/m. Lipid Panel: wnl HbgA1c: 5.4 QTc: 393) -- Encouraged patient to participate in unit milieu and in scheduled group therapies    3. Medical Management Diabetes Mellitus -Continue metformin 1000 mg bid with meals -Continue jardiance 25 mg daily   PRNs Tylenol 650 mg for mild pain Maalox/Mylanta 30 mL for indigestion Hydroxyzine 25 mg tid for anxiety Milk of Magnesia 30 mL for constipation nicotine polacrilex, 2 mg, PRN ziprasidone, 20 mg, Q12H PRN   4. Discharge Planning: Social work and case management to assist with discharge planning and identification of hospital follow-up needs prior to discharge Estimated LOS: 10 days Discharge Concerns: Need to establish a safety plan; Medication compliance and effectiveness Discharge Goals: Return home with outpatient referrals for mental health follow-up including medication management/psychotherapy     Rosezetta Schlatter, [name] PGY-2 06/01/2022  8:04 AM [facility] Department of Psychiatry

## 2022-06-01 NOTE — Progress Notes (Signed)
Adult Psychoeducational Group Note  Date:  06/01/2022 Time:  11:04 PM  Group Topic/Focus:  Wrap-Up Group:   The focus of this group is to help patients review their daily goal of treatment and discuss progress on daily workbooks.  Participation Level:  Active  Participation Quality:  Appropriate  Affect:  Appropriate  Cognitive:  Appropriate  Insight: Appropriate  Engagement in Group:  Improving  Modes of Intervention:  Discussion  Additional Comments:  Pt stated his goal for today was to focus on his treatment plan. Pt stated he accomplished his goal today. Pt stated he talked with his doctor and social worker about his care today. Pt rated his overall day a 10.  Pt stated he was able to talk with his mother, aunt and his cousin today which improved his overall day. Pt stated he felt better about himself today. Pt stated he was able to attend all meals. Pt stated he took all medications provided today. Pt stated he attend all groups held today. Pt stated his appetite was pretty good today. Pt rated sleep last night was pretty good. Pt stated the goal tonight was to get some rest. Pt stated he had no physical pain tonight. Pt deny visual hallucinations and auditory issues tonight. Pt denies thoughts of harming himself or others. Pt stated he would alert staff if anything changed   Candy Sledge 06/01/2022, 11:04 PM

## 2022-06-01 NOTE — Progress Notes (Signed)
   06/01/22 2210  Psych Admission Type (Psych Patients Only)  Admission Status Involuntary  Psychosocial Assessment  Patient Complaints None  Eye Contact Brief  Facial Expression Flat  Affect Preoccupied  Speech Logical/coherent  Interaction Assertive  Motor Activity Slow  Appearance/Hygiene Unremarkable  Behavior Characteristics Cooperative;Appropriate to situation  Mood Pleasant;Preoccupied  Thought Process  Coherency Tangential  Content WDL  Delusions None reported or observed  Perception Hallucinations  Hallucination Auditory  Judgment Poor  Confusion None  Danger to Self  Current suicidal ideation? Denies

## 2022-06-01 NOTE — [facility] Group Notes (Signed)
Goals Group 06/01/22   Group Focus: affirmation, clarity of thought, and goals/reality orientation Treatment Modality:  Psychoeducation Interventions utilized were assignment, group exercise, and support Purpose: To be able to understand and verbalize the reason for their admission to the hospital. To understand that the medication helps with their chemical imbalance but they also need to work on their choices in life. To be challenged to develop a list of 30 positives about themselves. Also introduce the concept that "feelings" are not reality.  Participation Level:  Active  Participation Quality:  Appropriate  Affect:  Appropriate  Cognitive:  Appropriate  Insight:  Improving  Engagement in Group:  Engaged  Additional Comments:  Pt rates his energy at a 10/10. Participated in the group  [name] A

## 2022-06-01 NOTE — Progress Notes (Signed)
   06/01/22 0625  15 Minute Checks  Location Bedroom  Visual Appearance Calm  Behavior Composed  Sleep (Behavioral Health Patients Only)  Calculate sleep? (Click Yes once per 24 hr at 0600 safety check) Yes  Documented sleep last 24 hours 7

## 2022-06-02 NOTE — Progress Notes (Signed)
[facility] MD Progress Note  06/02/2022 8:14 AM [name]  MRN:  [id number]   Reason for Admission:  [name] is a 47 y.o. male with a history of schizophrenia-paranoid, bipolar disorder housing insecurity, who was initially admitted for inpatient psychiatric hospitalization on 05/24/2022 for management of medication noncompliance and bizarre behavior such as pouring soda in heater, putting phone in toilet, and not sleeping. The patient is currently on Hospital Day 9.   Chart Review from last 24 hours:  The patient's chart was reviewed and nursing notes were reviewed. The patient's case was discussed in multidisciplinary team meeting. Per [facility], patient was taking medications appropriately. Per nursing, patient is calm and cooperative and attended groups. Patient received the following PRN medications: Hydroxyzine 25 mg, Nicorette gum 2 milligram Per RN, patient still pleasantly psychotic   Information Obtained Today During Patient Interview: The patient was seen and evaluated on the unit. On assessment today the patient reports feeling good overall. He reports sleeping well and intact appetite. He denies SI, HI, and paranoia this morning. He is having auditory hallucinations of voices coming through the air vent in his room. He is able to answer questions more clearly today but continues to have some disorganized thoughts.. He denies somatic concerns and complaints today. Denied thought broadcasting. No ideas of reference. He denies any other complaints today. No anxious thoughts, other pain, or concerns. Patient is aware that he needs a safe disposition plan.  Collateral: Mom [name] [phone])- No answer, voicemail mailbox full, unable to leave message.   Principal Problem: Paranoid schizophrenia ([place]) Diagnosis: Principal Problem:   Paranoid schizophrenia ([place])  Past Psychiatric History:  Previous Psych Diagnoses: Schizophrenia, Schizoaffective disorder-bipolar type Prior  inpatient treatment: [name] 04/2018, [facility] 12/2021 Prior outpatient treatment: [facility] History of suicide: History of homicide: Denies Psychiatric medication history: In addition to above, Previously on 6/14 Zyprexa 5 mg qAM and 30 mg qHS; Prolixin 10 mg in 05/2020, Invega Sustenna, PO Invega, and Haldol (02/2020), Neurontin, Clozapine 9/23 (switched to invega LAI +haldol) Psychiatric medication compliance history: Reports compliance, and picking up medications appropriately, per pharmacy records Neuromodulation history: Denies Current Psychiatrist: [name], DO ([facility] via [facility])  Past Medical History:  Past Medical History:  Diagnosis Date   Bipolar disorder ([place])    Bronchitis    Diabetes mellitus without complication ([place])    Hyperlipidemia    Paranoid schizophrenia ([place])    Schizophrenia ([place])     Past Surgical History:  Procedure Laterality Date   TESTICLE IMPLANTATION TO THIGH     TESTICLE SURGERY     Family History:  Family History  Problem Relation Age of Onset   Schizophrenia Neg Hx    Family Psychiatric  History:  N/A Social History:  See H&P  Current Medications: Current Facility-Administered Medications  Medication Dose Route Frequency Provider Last Rate Last Admin   acetaminophen (TYLENOL) tablet 650 mg  650 mg Oral Q6H PRN Motley-Mangrum, Jadeka A, PMHNP   650 mg at 06/01/22 0146   alum & mag hydroxide-simeth (MAALOX/MYLANTA) 200-200-20 MG/5ML suspension 30 mL  30 mL Oral Q4H PRN Motley-Mangrum, Jadeka A, PMHNP   30 mL at 06/02/22 0809   benztropine (COGENTIN) tablet 2 mg  2 mg Oral Q12H Goli, Veeraindar, MD   2 mg at 06/02/22 0805   divalproex (DEPAKOTE) DR tablet 500 mg  500 mg Oral Q12H Motley-Mangrum, Jadeka A, PMHNP   500 mg at 06/02/22 0807   empagliflozin (JARDIANCE) tablet 25  mg  25 mg Oral QAC breakfast Motley-Mangrum, Jadeka A, PMHNP   25 mg at 06/02/22 0805   haloperidol (HALDOL) tablet 10 mg  10 mg Oral Barton Fanny, MD   10 mg at 06/02/22 9371   hydrOXYzine (ATARAX) tablet 25 mg  25 mg Oral TID PRN Motley-Mangrum, Jadeka A, PMHNP   25 mg at 06/01/22 0146   magnesium hydroxide (MILK OF MAGNESIA) suspension 30 mL  30 mL Oral Daily PRN Motley-Mangrum, Jadeka A, PMHNP       metFORMIN (GLUCOPHAGE) tablet 1,000 mg  1,000 mg Oral BID WC Motley-Mangrum, Jadeka A, PMHNP   1,000 mg at 06/02/22 6967   nicotine polacrilex (NICORETTE) gum 2 mg  2 mg Oral PRN France Ravens, MD   2 mg at 05/28/22 0915   traZODone (DESYREL) tablet 150 mg  150 mg Oral QHS Attiah, Nadir, MD   150 mg at 06/01/22 2132   ziprasidone (GEODON) capsule 20 mg  20 mg Oral Q12H PRN Onuoha, Chinwendu V, NP        Lab Results: No results found for this or any previous visit (from the past 48 hour(s)).  Blood Alcohol level:  Lab Results  Component Value Date   ETH <10 05/22/2022   ETH <10 89/38/1017    Metabolic Disorder Labs: Lab Results  Component Value Date   HGBA1C 5.4 05/25/2022   MPG 108.28 05/25/2022   MPG 151.33 11/17/2021   Lab Results  Component Value Date   PROLACTIN 8.1 06/01/2019   Lab Results  Component Value Date   CHOL 140 05/25/2022   TRIG 144 05/25/2022   HDL 34 (L) 05/25/2022   CHOLHDL 4.1 05/25/2022   VLDL 29 05/25/2022   LDLCALC 77 05/25/2022   LDLCALC 68 12/01/2021    Physical Findings: AIMS: Facial and Oral Movements Muscles of Facial Expression: None, normal Lips and Perioral Area: None, normal Jaw: None, normal Tongue: None, normal,Extremity Movements Upper (arms, wrists, hands, fingers): None, normal Lower (legs, knees, ankles, toes): None, normal, Trunk Movements Neck, shoulders, hips: None, normal, Overall Severity Severity of abnormal movements (highest score from questions above): None, normal Incapacitation due to abnormal movements: None, normal Patient's awareness of abnormal movements (rate only patient's report): No Awareness, Dental Status Current problems with teeth and/or  dentures?: No Does patient usually wear dentures?: No  CIWA:    COWS:     Musculoskeletal: Strength & Muscle Tone: within normal limits Gait & Station: normal Patient leans: N/A  Psychiatric Specialty Exam:  General Appearance: appears at stated age, wearing hospital scrub top over casual clothing  Behavior: pleasant and cooperative  Psychomotor Activity: no psychomotor agitation or retardation noted   Eye Contact: fair Speech: normal amount, tone, volume and fluency. Decreased rate.    Mood: euthymic Affect: congruent, pleasant and interactive  Thought Process: linear, goal directed, no circumstantial or tangential thought process noted, no racing thoughts or flight of ideas Descriptions of Associations: intact Thought Content: Some bizarre content. Less compared to prior exam.  Hallucinations: denies AH, VH. Responding to internal stimuli at times.  Delusions: Some magical thinking and grandeur, no paranoia, delusions of control, ideas of reference, thought broadcasting Suicidal Thoughts: denies SI, intention, plan  Homicidal Thoughts: denies HI, intention, plan   Alertness/Orientation: alert and fully oriented  Insight: limited Judgment: limited  Memory: fair  Executive Functions  Concentration: intact  Attention Span: fair Recall: intact Fund of Knowledge: limited  Animal nutritionist; Social Support  Physical Exam Constitutional:  Appearance: Normal appearance.  Cardiovascular:     Rate and Rhythm: Normal rate.  Pulmonary:     Effort: Pulmonary effort is normal.  Neurological:     General: No focal deficit present.     Mental Status: Alert and oriented to person, place, and time.    Review of Systems  Constitutional: Negative.  Negative for chills, fever and weight loss.  HENT: Negative.    Eyes: Negative.   Respiratory: Negative.    Cardiovascular: Negative.   Gastrointestinal:  Negative for constipation, diarrhea, nausea and  vomiting.  Genitourinary: Negative.   Musculoskeletal: Negative.   Skin: Negative.   Neurological: Negative.  Negative for tingling.    ASSESSMENT AND PLAN Azreal Stthomas is a 47 year old male with a reported past medical history of schizoaffective disorder, bipolar type, tobacco use disorder, dyslipidemia, and diabetes who presented to [facility] under [facility] for medication noncompliance and bizarre behavior such as pouring soda in heater, putting phone in toilet, and not sleeping. Patient has been tolerating medications well and appears to be at psychiatric baseline. He is stable for discharge, pending safe disposition plan.   PLAN Safety and Monitoring: Involuntary admission to inpatient psychiatric unit for safety, stabilization and treatment Daily contact with patient to assess and evaluate symptoms and progress in treatment Patient's case to be discussed in multi-disciplinary team meeting Observation Level : q15 minute checks Vital signs: q12 hours Precautions: suicide, elopement, and assault   Psychiatric Problems Schizoaffective Disorder, Bipolar Type Tobacco Use Disorder --Received two Invega injections IM during this admission (1/27 and 2/1)  --Also on haldol 10 mg bid for psychosis.  -Continue Cogentin 2 mg q12h for EPS prophylaxis given he is on 2 antipsychotics --Continue Depakote DR 500 mg bid for mood stabilization and impulsivity             -VPA level is 39 on 1/31.  --NRT with patch and gum --Continued trazodone 150 mg qSH - sleep --  The risks/benefits/side-effects/alternatives to this medication were discussed in detail with the patient and time was given for questions. The patient consents to medication trial.  -- Metabolic profile and EKG monitoring obtained while on an atypical antipsychotic (BMI: Body mass index is 34.17 kg/m. Lipid Panel: wnl HbgA1c: 5.4 QTc: 393) -- Encouraged patient to participate in unit milieu and in scheduled group therapies    3. Medical  Management Diabetes Mellitus -Continue metformin 1000 mg bid with meals -Continue jardiance 25 mg daily   PRNs Tylenol 650 mg for mild pain Maalox/Mylanta 30 mL for indigestion Hydroxyzine 25 mg tid for anxiety Milk of Magnesia 30 mL for constipation nicotine polacrilex, 2 mg, PRN ziprasidone, 20 mg, Q12H PRN   4. Discharge Planning: Social work and case management to assist with discharge planning and identification of hospital follow-up needs prior to discharge Estimated LOS: 10 days Discharge Concerns: Need to establish a safety plan; Medication compliance and effectiveness Discharge Goals: Return home with outpatient referrals for mental health follow-up including medication management/psychotherapy     Rosezetta Schlatter, MD PGY-2 06/02/2022  8:14 AM [place] Department of Psychiatry

## 2022-06-02 NOTE — Tx Team (Incomplete)
Initial Treatment Plan 06/02/2022 4:44 PM [name] EXN:[id number]    PATIENT STRESSORS: {PATIENT STRESSORS:22669}   PATIENT STRENGTHS: {PATIENT STRENGTHS:22666}   PATIENT IDENTIFIED PROBLEMS:                      DISCHARGE CRITERIA:  {DISCHARGE CRITERIA:22667}  PRELIMINARY DISCHARGE PLAN: {Preliminary discharge plan:22668}  PATIENT/FAMILY INVOLVEMENT: This treatment plan has been presented to and reviewed with the patient, [name], and/or family member, ***.  The patient and family have been given the opportunity to ask questions and make suggestions.  Dorris Carnes, RN 06/02/2022, 4:44 PM

## 2022-06-02 NOTE — Progress Notes (Signed)
   06/02/22 0809  Charting Type  Charting Type Shift assessment  Safety Check Verification  Has the RN verified the 15 minute safety check completion? Yes  Neurological  Neuro (WDL) WDL  HEENT  HEENT (WDL) WDL  Respiratory  Respiratory (WDL) WDL  Cardiac  Cardiac (WDL) WDL  Vascular  Vascular (WDL) WDL  Integumentary  Integumentary (WDL) WDL  Braden Scale (Ages 8 and up)  Sensory Perceptions 4  Moisture 4  Activity 4  Mobility 4  Nutrition 3  Friction and Shear 3  Braden Scale Score 22  Musculoskeletal  Musculoskeletal (WDL) WDL  Assistive Device None  Gastrointestinal  Gastrointestinal (WDL) WDL  Last BM Date  06/02/22  GU Assessment  Genitourinary (WDL) WDL  Neurological  Level of Consciousness Alert

## 2022-06-02 NOTE — Plan of Care (Signed)

## 2022-06-02 NOTE — Progress Notes (Signed)
   06/02/22 2244  Psych Admission Type (Psych Patients Only)  Admission Status Involuntary  Psychosocial Assessment  Patient Complaints None  Eye Contact Brief  Facial Expression Flat  Affect Preoccupied  Speech Logical/coherent  Interaction Assertive  Motor Activity Slow  Appearance/Hygiene Unremarkable  Behavior Characteristics Appropriate to situation;Cooperative  Mood Pleasant;Preoccupied  Thought Community education officer  Content WDL  Delusions None reported or observed  Perception Hallucinations  Hallucination Auditory  Judgment Poor  Confusion None  Danger to Self  Current suicidal ideation? Denies

## 2022-06-02 NOTE — Progress Notes (Signed)
   06/02/22 0558  15 Minute Checks  Location Bedroom  Visual Appearance Calm  Behavior Composed  Sleep (Behavioral Health Patients Only)  Calculate sleep? (Click Yes once per 24 hr at 0600 safety check) Yes  Documented sleep last 24 hours 4

## 2022-06-02 NOTE — [facility] Group Notes (Signed)
Adult Psychoeducational Group Note Date:  06/02/2022 Time:  0900-1000 Group Topic/Focus: PROGRESSIVE RELAXATION. A group where deep breathing is taught and tensing and relaxation muscle groups is used. Imagery is used as well.  Pts are asked to imagine 3 pillars that hold them up when they are not able to hold themselves up and to share that with the group.   Participation Level: Pt sat in the room and slept the whole time. Woke up for a few minutes when he shared with the group "see this floor? This floor opens up and a dragon lives down there. He then went back to sleep.   : [name]

## 2022-06-02 NOTE — Progress Notes (Signed)
Adult Psychoeducational Group Note  Date:  06/02/2022 Time:  10:26 PM  Group Topic/Focus:  Wrap-Up Group:   The focus of this group is to help patients review their daily goal of treatment and discuss progress on daily workbooks.  Participation Level:  Active  Participation Quality:  Appropriate  Affect:  Appropriate  Cognitive:  Appropriate  Insight: Appropriate  Engagement in Group:  Limited  Modes of Intervention:  Discussion  Additional Comments:   Pt stated his goal for today was to focus on his treatment plan and discuss his discharge plan with his doctor. Pt stated he accomplished his goals today. Pt stated he talked with his doctor and social worker about his care today. Pt rated his overall day a 10. Pt stated the plan is for him to discharge on 06/03/2022.  Pt stated he was able to talk with his mother, aunt and his cousin today which improved his overall day. Pt stated he felt better about himself today. Pt stated he was able to attend all meals. Pt stated he took all medications provided today. Pt stated he attend all groups held today. Pt stated his appetite was pretty good today. Pt rated sleep last night was pretty good. Pt stated the goal tonight was to get some rest. Pt stated he had some physical pain tonight. Pt stated he had some moderate pain in both knees. Pt rated the moderate pain in both knees a 5 on the pain level scale. Pt nurse was updated on the situation. Pt deny visual hallucinations and auditory issues tonight. Pt denies thoughts of harming himself or others. Pt stated he would alert staff if anything changed    Candy Sledge 06/02/2022, 10:26 PM

## 2022-06-03 MED ORDER — HALOPERIDOL 10 MG PO TABS: 10.0000 mg | ORAL_TABLET | Freq: Two times a day (BID) | ORAL | 0 refills | Status: AC

## 2022-06-03 MED ORDER — BENZTROPINE MESYLATE 2 MG PO TABS: 2.0000 mg | ORAL_TABLET | Freq: Two times a day (BID) | ORAL | 0 refills | Status: AC

## 2022-06-03 MED ORDER — METFORMIN HCL 1000 MG PO TABS: 1000.0000 mg | ORAL_TABLET | Freq: Two times a day (BID) | ORAL | 0 refills | Status: DC

## 2022-06-03 MED ORDER — DIVALPROEX SODIUM 500 MG PO DR TAB: 500.0000 mg | DELAYED_RELEASE_TABLET | Freq: Two times a day (BID) | ORAL | 0 refills | Status: DC

## 2022-06-03 MED ORDER — EMPAGLIFLOZIN 25 MG PO TABS: 25.0000 mg | ORAL_TABLET | Freq: Every day | ORAL | 0 refills | Status: AC

## 2022-06-03 MED ORDER — TRAZODONE HCL 100 MG PO TABS: 100.0000 mg | ORAL_TABLET | Freq: Every evening | ORAL | 0 refills | Status: DC | PRN

## 2022-06-03 MED ORDER — TRAZODONE HCL 150 MG PO TABS: 150.0000 mg | ORAL_TABLET | Freq: Every evening | ORAL | 0 refills | Status: DC | PRN

## 2022-06-03 MED ORDER — ATORVASTATIN CALCIUM 40 MG PO TABS: 40.0000 mg | ORAL_TABLET | Freq: Every day | ORAL | 0 refills | Status: AC

## 2022-06-03 MED ORDER — NICOTINE POLACRILEX 2 MG MT GUM: 2.0000 mg | CHEWING_GUM | OROMUCOSAL | 0 refills | Status: AC | PRN
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
## 2022-06-03 NOTE — Progress Notes (Signed)
  [facility] Adult Case Management Discharge Plan :  Will you be returning to the same living situation after discharge:  Yes,  Patient will be returning back with his mom At discharge, do you have transportation home?: Yes,  Patient mom came to pick him up Do you have the ability to pay for your medications: Yes,  Patient had [place] Medicare   Release of information consent forms completed and in the chart;  Patient's signature needed at discharge.  Patient to Follow up at:  Follow-up Information     [facility]. Call.   Why: Please call patient ACTT team to inform them that patient has DC from hospital and infrom them that you will need someone to come out to Follow up post-discharge. Contact information: [place] [address] [phone]                 Next level of care provider has access to [place] and Suicide Prevention discussed: Sherilyn Dacosta  [phone]      Has patient been referred to the Quitline?: N/A patient is not a smoker  Patient has been referred for addiction treatment: N/A  Sherre Lain, LCSWA 06/03/2022, 3:21 PM
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
## 2022-06-03 NOTE — Progress Notes (Signed)
[facility] LCSW Progress Note   06/03/2022    10:14 AM  [name]      Type of Note: Follow Up with ACTT   CSW spoke with Tiffany this morning from [facility] in [place] to get a point of contact for patient ACTT team to notify them that patient was discharging today. Tiffany provided CSW with Carolee Rota number who is part of the ACTT Team and currently working at the [place] office this week at [phone] ext. [phone] and ext. [phone]. CSW was unable to leave a voicemail and called Tiffany back about issue not reaching anyone. Tiffany then provided CSW with [facility] cell number, CSW called cell and was unable to get in contact or leave a voicemail because mailbox is full. CSW will continue to keep calling add information in chart for family to call informing the ACTT team that patient was home.    Signed:   Silas Flood, MSW, LCSWA 06/03/2022 10:14 AM
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
## 2022-06-03 NOTE — Progress Notes (Signed)
Patient discharged. Reviewed discharge instructions. Patient verbalized understanding. Patient received all personal belongings. Left unit at 1515.

## 2022-06-03 NOTE — Group Note (Signed)
[place] LCSW Group Therapy   Type of Therapy and Topic:  Group Therapy:  Wellness   Participation Level: Active  Description of Group: This group allows individuals to explore the 6 dimensions of wellness, including spiritual, emotional, intellectual, physical, social, environmental, financial and spiritual. Patients will learn to different ways to practice wellness to improve well-being. Patients also participated in a conversation about what wellness means to them.   Individuals will think about ways in which they currently practice wellness as well as ways they can improve their wellness and new ways to practice wellness.       Therapeutic Goals Patient will verbalize 1 pr 2 we;;mess areas where they are doing well. Patient will identify 2 areas where they would like to improve their wellness.   Patient will provide a definition of what wellness means to them.  Patients will reflect on current hospitalization and primary areas to maintain mental health to prevent re-hospitalization.     Summary of Patient Progress:  Patient participated appropriately in group. He discussed financial wellness and when he doesn't have money in his pocket he knows he is not doing well.  Patient was able to discuss things he does in the community to stay well and states that he is going to work on exercising more consistently.       Therapeutic Modalities Cognitive Behavioral Therapy Motivational Interviewing   Flannery Cavallero, LCSW, [place] Social Worker  [facility]

## 2022-06-03 NOTE — [facility] Group Notes (Signed)
Adult Psychoeducational Group Note  Date:  06/03/2022 Time:  11:02 AM  Group Topic/Focus:  Goals Group:   The focus of this group is to help patients establish daily goals to achieve during treatment and discuss how the patient can incorporate goal setting into their daily lives to aide in recovery.  Participation Level:  Active  Participation Quality:  Appropriate  Affect:  Angry  Cognitive:  Appropriate  Insight: Appropriate  Engagement in Group:  Engaged  Modes of Intervention:  Discussion   Dub Mikes 06/03/2022, 11:02 AM

## 2022-06-10 DIAGNOSIS — S9781XA Crushing injury of right foot, initial encounter: Secondary | ICD-10-CM | POA: Diagnosis not present

## 2022-06-10 DIAGNOSIS — F17210 Nicotine dependence, cigarettes, uncomplicated: Secondary | ICD-10-CM | POA: Diagnosis not present

## 2022-06-10 DIAGNOSIS — S9782XA Crushing injury of left foot, initial encounter: Secondary | ICD-10-CM | POA: Diagnosis not present

## 2022-06-10 DIAGNOSIS — L988 Other specified disorders of the skin and subcutaneous tissue: Secondary | ICD-10-CM | POA: Diagnosis not present

## 2022-06-10 DIAGNOSIS — Z79899 Other long term (current) drug therapy: Secondary | ICD-10-CM | POA: Diagnosis not present

## 2022-06-10 DIAGNOSIS — Z91048 Other nonmedicinal substance allergy status: Secondary | ICD-10-CM | POA: Diagnosis not present

## 2022-06-10 DIAGNOSIS — Z9109 Other allergy status, other than to drugs and biological substances: Secondary | ICD-10-CM | POA: Diagnosis not present

## 2022-06-10 DIAGNOSIS — Z5900 Homelessness unspecified: Secondary | ICD-10-CM | POA: Diagnosis not present

## 2022-06-17 ENCOUNTER — Encounter (HOSPITAL_COMMUNITY): Payer: Self-pay | Admitting: Emergency Medicine

## 2022-06-17 ENCOUNTER — Other Ambulatory Visit: Payer: Self-pay

## 2022-06-17 ENCOUNTER — Emergency Department (HOSPITAL_COMMUNITY)
Admission: EM | Admit: 2022-06-17 | Discharge: 2022-06-17 | Disposition: A | Discharge status: Home / Self Care | Payer: 59 | Attending: Student | Admitting: Student

## 2022-06-17 DIAGNOSIS — Z79899 Other long term (current) drug therapy: Secondary | ICD-10-CM | POA: Insufficient documentation

## 2022-06-17 DIAGNOSIS — I10 Essential (primary) hypertension: Secondary | ICD-10-CM | POA: Insufficient documentation

## 2022-06-17 DIAGNOSIS — F22 Delusional disorders: Secondary | ICD-10-CM | POA: Diagnosis present

## 2022-06-17 DIAGNOSIS — E119 Type 2 diabetes mellitus without complications: Secondary | ICD-10-CM | POA: Diagnosis not present

## 2022-06-17 DIAGNOSIS — R29898 Other symptoms and signs involving the musculoskeletal system: Secondary | ICD-10-CM | POA: Diagnosis not present

## 2022-06-17 DIAGNOSIS — M797 Fibromyalgia: Secondary | ICD-10-CM | POA: Diagnosis not present

## 2022-06-17 DIAGNOSIS — Z7901 Long term (current) use of anticoagulants: Secondary | ICD-10-CM | POA: Diagnosis not present

## 2022-06-17 DIAGNOSIS — K219 Gastro-esophageal reflux disease without esophagitis: Secondary | ICD-10-CM | POA: Diagnosis not present

## 2022-06-17 DIAGNOSIS — F17210 Nicotine dependence, cigarettes, uncomplicated: Secondary | ICD-10-CM | POA: Diagnosis not present

## 2022-06-17 DIAGNOSIS — R531 Weakness: Secondary | ICD-10-CM | POA: Diagnosis not present

## 2022-06-17 DIAGNOSIS — Z7984 Long term (current) use of oral hypoglycemic drugs: Secondary | ICD-10-CM | POA: Diagnosis not present

## 2022-06-17 DIAGNOSIS — E876 Hypokalemia: Secondary | ICD-10-CM | POA: Diagnosis not present

## 2022-06-17 DIAGNOSIS — Z791 Long term (current) use of non-steroidal anti-inflammatories (NSAID): Secondary | ICD-10-CM | POA: Diagnosis not present

## 2022-06-17 DIAGNOSIS — Z23 Encounter for immunization: Secondary | ICD-10-CM | POA: Diagnosis not present

## 2022-06-17 DIAGNOSIS — E7800 Pure hypercholesterolemia, unspecified: Secondary | ICD-10-CM | POA: Diagnosis not present

## 2022-06-17 DIAGNOSIS — K9423 Gastrostomy malfunction: Secondary | ICD-10-CM | POA: Diagnosis not present

## 2022-06-17 LAB — COMPREHENSIVE METABOLIC PANEL
ALT: 22 U/L (ref 0–44)
AST: 22 U/L (ref 15–41)
Albumin: 3.9 g/dL (ref 3.5–5.0)
Alkaline Phosphatase: 61 U/L (ref 38–126)
Anion gap: 10 (ref 5–15)
BUN: 17 mg/dL (ref 6–20)
CO2: 26 mmol/L (ref 22–32)
Calcium: 8.9 mg/dL (ref 8.9–10.3)
Chloride: 102 mmol/L (ref 98–111)
Creatinine, Ser: 1.04 mg/dL (ref 0.61–1.24)
GFR, Estimated: >60 mL/min (ref >60)
Glucose, Bld: 130 mg/dL — ABNORMAL HIGH (ref 70–99)
Potassium: 3.7 mmol/L (ref 3.5–5.1)
Sodium: 138 mmol/L (ref 135–145)
Total Bilirubin: 0.4 mg/dL (ref 0.3–1.2)
Total Protein: 7.2 g/dL (ref 6.5–8.1)

## 2022-06-17 LAB — RAPID URINE DRUG SCREEN, HOSP PERFORMED
Amphetamines: NOT DETECTED
Barbiturates: NOT DETECTED
Benzodiazepines: NOT DETECTED
Cocaine: NOT DETECTED
Opiates: NOT DETECTED
Tetrahydrocannabinol: NOT DETECTED

## 2022-06-17 LAB — CBC
HCT: 41.5 % (ref 39.0–52.0)
Hemoglobin: 14.2 g/dL (ref 13.0–17.0)
MCH: 30.3 pg (ref 26.0–34.0)
MCHC: 34.2 g/dL (ref 30.0–36.0)
MCV: 88.5 fL (ref 80.0–100.0)
Platelets: 330 10*3/uL (ref 150–400)
RBC: 4.69 MIL/uL (ref 4.22–5.81)
RDW: 12.6 % (ref 11.5–15.5)
WBC: 11.6 10*3/uL — ABNORMAL HIGH (ref 4.0–10.5)
nRBC: 0 % (ref 0.0–0.2)

## 2022-06-17 LAB — ETHANOL: Alcohol, Ethyl (B): <10 mg/dL (ref <10)

## 2022-06-17 MED ORDER — TRAZODONE HCL 150 MG PO TABS: 150.0000 mg | ORAL_TABLET | Freq: Every evening | ORAL | 0 refills | Status: AC | PRN

## 2022-06-17 NOTE — BH Assessment (Addendum)
Comprehensive Clinical Assessment (CCA) Note  06/17/2022 [name] [id number] Disposition: Clinician discussed patient care with James Score, NP.  She recommended inpatient care for patient.  However patient was discharged before recommendation was given.    Pt presented with good eye contact.  He was oriented x3.  Pt is a poor historian due to his current mental status.  Pt does not appear to be able to make sound decisions regarding his welfare or health.  He is currently homeless.  Pt talks in phrases that do not make sense.  He is currently delusional and not taking medications.    Patient was at [facility] January 26-February 5 '24.  Pt reportedly has ACTT services from [facility] but this is not verified.  Chief Complaint:  Chief Complaint  Patient presents with   V70.1   Visit Diagnosis: Schizophrenia    CCA Screening, Triage and Referral (STR)  Patient Reported Information How did you hear about [place]? Self  What Is the Reason for Your Visit/Call Today? Patient said that he walked over to the hospital.  He is not able to carry on a coherent conversation.  He is talking about how [employer] took him into a UFO.  He says "I praise Exodus, not Genesis."  Pt says that he does stay with his parents but that his father "wanted me to praise him."  Pt shakes his head "no"  when asked about SI or HI.  He denies any A/V hallucinations.  Pt denies any access to guns.  Guardian (mother) said that patient called her today "saying things that did not sound right."  She said he is not thinking clearly.  He was talking to her about how he was going to leave town.  Pt lives in a house that his father owns.  Pt damages the property, clogs up toilets and he was painting the bricks blue.  Mother said that patient does have an ACTT team through [facility] in [place].  [name] said she is not sure how much contact patient has with his ACTT team.  Mother said that patient had been seen walking around town in [place]  and acting strangely.  Patient's mother called police and they brought him to hospital.  Patient had been staying at his dad's rental house up to the last 4 days.  He had vandalized the house.  Patient had told his dad that he was staying with "some guy" in a tent.  Pt is essentially homeles now.  Mother said that patient was on an injection medication that was very effective but he told the doctor that he did not want the injection.  Patient has been taken off the injection and back on oral meds which he has been non-compliant with.  How Long Has This Been Causing You Problems? > than 6 months  What Do You Feel Would Help You the Most Today? Treatment for Depression or other mood problem   Have You Recently Had Any Thoughts About Hurting Yourself? No  Are You Planning to Commit Suicide/Harm Yourself At This time? No   Flowsheet Row ED from 06/17/2022 in [facility] Emergency Department at [facility] Admission (Discharged) from 05/24/2022 in [place] 500B ED from 05/22/2022 in [facility] Emergency Department at [place] No Risk No Risk No Risk       Have you Recently Had Thoughts About [place]? No  Are You Planning to Harm Someone at This Time? No  Explanation: Patient is denying SI and HI.   Have You Used Any Alcohol or Drugs in the Past 24 Hours? No  What Did You Use and How Much? Pt denies   Do You Currently Have a Therapist/Psychiatrist? -- (Pt is unclear.  Previous notes indicate ACTT team from [facility].)  Name of Therapist/Psychiatrist: Name of Therapist/Psychiatrist: Unable to assess due to mental status   Have You Been Recently Discharged From Any Office Practice or Programs? No  Explanation of Discharge From Practice/Program: N/A     CCA Screening Triage Referral Assessment Type of Contact: Tele-Assessment  Telemedicine Service Delivery:   Is this Initial or Reassessment? Is this  Initial or Reassessment?: Initial Assessment  Date Telepsych consult ordered in [facility]:  Date Telepsych consult ordered in [facility]: 06/17/22  Time Telepsych consult ordered in [facility]:  Time Telepsych consult ordered in [facility]: 2003  Location of Assessment: [facility] ED  Provider Location: [facility] Assessment Services   Collateral Involvement: mother/legal guardian Hoyle Sauer [phone]   Does Patient Have a Court Appointed Legal Guardian? Yes Mother  Legal Guardian Contact Information: Mother is the legal guardian.  Her name is Hoyle Sauer and her number is [phone]  Copy of Legal Guardianship Form: No - copy requested  Legal Guardian Notified of Arrival: Successfully notified (Clinician talked to guardian)  Legal Guardian Notified of Pending Discharge: Attempted notification unsuccessful  If Minor and Not Living with Parent(s), Who has Custody? Pt is a 47 year old adult  Is CPS involved or ever been involved? Never  Is APS involved or ever been involved? Never   Patient Determined To Be At Risk for Harm To Self or Others Based on Review of Patient Reported Information or Presenting Complaint? Yes, for Self-Harm  Method: No Plan (Pt is denying SI and HI.)  Availability of Means: No access or NA (Pt is denying SI and HI.)  Intent: -- (Pt is denying SI and HI.)  Notification Required: No need or identified person (Pt is denying SI and HI.)  Additional Information for Danger to Others Potential: -- (Pt is denying SI and HI.)  Additional Comments for Danger to Others Potential: Pt is denying SI and HI.  Are There Guns or Other Weapons in [place]? No  Types of Guns/Weapons: None  Are These Weapons Safely Secured?                            No  Who Could Verify You Are Able To Have These Secured: No guns to safely secure.  Do You Have any Outstanding Charges, Pending Court Dates, Parole/Probation? None reported  Contacted To Inform of Risk of Harm To Self or Others:  -- (Did contact guardian)    Does Patient Present under Involuntary Commitment? No    [place] of Residence: [place]   Patient Currently Receiving the Following Services: ACTT Architect) ([facility] in [place] as ACTT provider?)   Determination of Need: Urgent (48 hours)   Options For Referral: Inpatient Hospitalization     CCA Biopsychosocial Patient Reported Schizophrenia/Schizoaffective Diagnosis in Past: Yes   Strengths: Pt is talkative.   Mental Health Symptoms Depression:   None   Duration of Depressive symptoms:  Duration of Depressive Symptoms: N/A   Mania:   Change in energy/activity; Racing thoughts   Anxiety:    Worrying; Tension; Restlessness   Psychosis:   Grossly disorganized speech; Hallucinations   Duration of Psychotic symptoms:  Duration of  Psychotic Symptoms: Greater than six months   Trauma:   N/A (Unable to assess due to current mental status)   Obsessions:   Disrupts routine/functioning; Recurrent & persistent thoughts/impulses/images; Poor insight   Compulsions:   N/A (Unknown what compulsions may be.)   Inattention:   Does not follow instructions (not oppositional); Does not seem to listen   Hyperactivity/Impulsivity:   Always on the go   Oppositional/Defiant Behaviors:   N/A   Emotional Irregularity:   Transient, stress-related paranoia/disassociation   Other Mood/Personality Symptoms:   unable to assess due to mental status    Mental Status Exam Appearance and self-care  Stature:   Average   Weight:   Overweight   Clothing:   Casual   Grooming:   Neglected   Cosmetic use:   None   Posture/gait:   Normal   Motor activity:   Not Remarkable   Sensorium  Attention:   Distractible   Concentration:   Preoccupied; Scattered   Orientation:   Place; Person   Recall/memory:   Defective in Short-term; Defective in Recent   Affect and Mood  Affect:   Anxious   Mood:    Anxious   Relating  Eye contact:   None   Facial expression:   Anxious   Attitude toward examiner:   Cooperative   Thought and Language  Speech flow:  Flight of Ideas   Thought content:   Delusions   Preoccupation:   Ruminations Research scientist (physical sciences), porn stars)   Hallucinations:   Auditory   Organization:   Disorganized; Irrelevant; Loose   Transport planner of Knowledge:   Average   Intelligence:   Needs investigation   Abstraction:   Abstract   Judgement:   Impaired   Reality Testing:   Distorted   Insight:   Lacking   Decision Making:   Impulsive; Only simple   Social Functioning  Social Maturity:   Impulsive   Social Judgement:   Impropriety; Heedless   Stress  Stressors:   Relationship; Financial; Housing   Coping Ability:   Overwhelmed; Deficient supports   Skill Deficits:   Interpersonal; Decision making; Communication; Self-control; Responsibility   Supports:   Family; Friends/Service system     Religion: Religion/Spirituality Are You A Religious Person?: Yes What is Your Religious Affiliation?: Christian How Might This Affect Treatment?: None  Leisure/Recreation: Leisure / Recreation Do You Have Hobbies?: Yes Leisure and Hobbies: "everything"  Exercise/Diet: Exercise/Diet Do You Exercise?: No Have You Gained or Lost A Significant Amount of Weight in the Past Six Months?: No Do You Follow a Special Diet?: No Do You Have Any Trouble Sleeping?: Yes Explanation of Sleeping Difficulties: 4-5 hours nightly   CCA Employment/Education Employment/Work Situation: Employment / Work Situation Employment Situation: On disability Why is Patient on Disability: Schizophrenia  How Long has Patient Been on Disability: Per chart, since 1998 Patient's Job has Been Impacted by Current Illness: No Has Patient ever Been in the [employer]?: No  Education: Education Is Patient Currently Attending School?: No School Currently Attending:  n/a Last Grade Completed: 12 Did [place]?: No Did You Have An Individualized Education Program (IIEP): No Did You Have Any Difficulty At School?: No Patient's Education Has Been Impacted by Current Illness: No   CCA Family/Childhood History Family and Relationship History: Family history Marital status: Single Does patient have children?: No  Childhood History:  Childhood History By whom was/is the patient raised?: Both parents Description of patient's current relationship with siblings: older brother is  deceased; has 2 remaining brothers Did patient suffer any verbal/emotional/physical/sexual abuse as a child?: No Did patient suffer from severe childhood neglect?: No Has patient ever been sexually abused/assaulted/raped as an adolescent or adult?: No Was the patient ever a victim of a crime or a disaster?: No Witnessed domestic violence?: No Has patient been affected by domestic violence as an adult?: No       CCA Substance Use Alcohol/Drug Use: Alcohol / Drug Use Pain Medications: See MAR Prescriptions: See d/c medication list from stay at [facility] from 05/24/22 to 06/03/22 Over the Counter: See MAR History of alcohol / drug use?: No history of alcohol / drug abuse Longest period of sobriety (when/how long): Patient states that he has been free of alcohol and dugs for the past 6-7 years.  UDS and BAL are negative. Withdrawal Symptoms: None                         ASAM's:  Six Dimensions of Multidimensional Assessment  Dimension 1:  Acute Intoxication and/or Withdrawal Potential:      Dimension 2:  Biomedical Conditions and Complications:      Dimension 3:  Emotional, Behavioral, or Cognitive Conditions and Complications:     Dimension 4:  Readiness to Change:     Dimension 5:  Relapse, Continued use, or Continued Problem Potential:     Dimension 6:  Recovery/Living Environment:     ASAM Severity [name]:    ASAM Recommended Level of Treatment:      Substance use Disorder (SUD)    Recommendations for Services/Supports/Treatments:    Discharge Disposition:    DSM5 Diagnoses: Patient Active Problem List   Diagnosis Date Noted   Diabetes ([place]) 12/15/2021   Hyperlipidemia 12/15/2021   Psychosis, unspecified psychosis type ([place]) 12/07/2021   Hypokalemia 11/17/2021   Overdose, intentional self-harm, initial encounter ([place]) 02/24/2020   Schizophrenia ([place]) 02/24/2020   Paranoid schizophrenia ([place]) 02/02/2020   Schizophrenia, paranoid ([place]) 05/31/2019   Tobacco use disorder    Schizoaffective disorder, bipolar type ([place]) 06/29/2014   Psychosis ([place]) 07/27/2013     Referrals to Alternative Service(s): Referred to Alternative Service(s):   Place:   Date:   Time:    Referred to Alternative Service(s):   Place:   Date:   Time:    Referred to Alternative Service(s):   Place:   Date:   Time:    Referred to Alternative Service(s):   Place:   Date:   Time:     Waldron Session

## 2022-06-17 NOTE — ED Triage Notes (Addendum)
Pt states his father is trying to kill him for drinking the purple drink. Pt states he is trying to take care of his mother and he has a license he just needs a car. Pt denies any SI/HI ideations at this time. He states his parents don't know what is going on anymore and get mad when he grow his hair out.

## 2022-06-17 NOTE — ED Notes (Signed)
Began [facility] assessment via TTS.

## 2022-06-17 NOTE — ED Notes (Signed)
Dr. Matilde Sprang is at the bedside.

## 2022-06-18 ENCOUNTER — Encounter (HOSPITAL_COMMUNITY): Payer: Self-pay | Admitting: Emergency Medicine

## 2022-06-18 ENCOUNTER — Emergency Department (HOSPITAL_COMMUNITY)
Admission: EM | Admit: 2022-06-18 | Discharge: 2022-06-19 | Disposition: A | Discharge status: Home / Self Care | Payer: 59 | Attending: Emergency Medicine | Admitting: Emergency Medicine

## 2022-06-18 ENCOUNTER — Other Ambulatory Visit: Payer: Self-pay

## 2022-06-18 DIAGNOSIS — Z7689 Persons encountering health services in other specified circumstances: Secondary | ICD-10-CM

## 2022-06-18 DIAGNOSIS — Z7984 Long term (current) use of oral hypoglycemic drugs: Secondary | ICD-10-CM | POA: Insufficient documentation

## 2022-06-18 DIAGNOSIS — R42 Dizziness and giddiness: Secondary | ICD-10-CM | POA: Diagnosis present

## 2022-06-18 DIAGNOSIS — G479 Sleep disorder, unspecified: Secondary | ICD-10-CM | POA: Diagnosis not present

## 2022-06-18 LAB — CBG MONITORING, ED: Glucose-Capillary: 129 mg/dL — ABNORMAL HIGH (ref 70–99)

## 2022-06-18 NOTE — ED Triage Notes (Signed)
Pt states he has been dizzy about 10 minutes. Pt states he needs his blood and sugar checked.

## 2022-06-18 NOTE — ED Provider Notes (Signed)
[facility] Provider Note  CSN: [id number] Arrival date & time: 06/17/22 1948  Chief Complaint(s) V70.1  HPI [name] is a 47 y.o. male with PMH schizophrenia, HLD, T2DM, bipolar disorder, delusional behavior who presents emergency department for evaluation of delusions.  Patient walked into the emergency department today and told triage staff that he was concerned that his father may be trying to poison him.  He adamantly denied suicidal or homicidal ideation at that time.  He states that he has run out of his trazodone and has not been sleeping because of this.  On my evaluation, he no longer is complaining of concerns of his father trying to hurt him and is primarily requesting food and drink.  For me, the patient is actively denying suicidal or homicidal ideation, auditory or visual hallucinations.  Denies chest pain, shortness of breath, abdominal pain, nausea, vomiting or other systemic symptoms.   Past Medical History Past Medical History:  Diagnosis Date   Bipolar disorder ([place])    Bronchitis    Diabetes mellitus without complication ([place])    Hyperlipidemia    Paranoid schizophrenia ([place])    Schizophrenia ([place])    Patient Active Problem List   Diagnosis Date Noted   Diabetes ([place]) 12/15/2021   Hyperlipidemia 12/15/2021   Psychosis, unspecified psychosis type ([place]) 12/07/2021   Hypokalemia 11/17/2021   Overdose, intentional self-harm, initial encounter ([place]) 02/24/2020   Schizophrenia ([place]) 02/24/2020   Paranoid schizophrenia ([place]) 02/02/2020   Schizophrenia, paranoid ([place]) 05/31/2019   Tobacco use disorder    Schizoaffective disorder, bipolar type ([place]) 06/29/2014   Psychosis ([place]) 07/27/2013   Home Medication(s) Prior to Admission medications   Medication Sig Start Date End Date Taking? Authorizing Provider  atorvastatin (LIPITOR) 40 MG tablet Take 1 tablet (40 mg total) by mouth at bedtime. 06/03/22 07/03/22  Merrily Brittle, DO  benztropine (COGENTIN) 2 MG tablet Take 1 tablet (2 mg total) by mouth every 12 (twelve) hours. 06/03/22 07/03/22  Merrily Brittle, DO  divalproex (DEPAKOTE) 500 MG DR tablet Take 1 tablet (500 mg total) by mouth every 12 (twelve) hours. 06/03/22 07/03/22  Merrily Brittle, DO  empagliflozin (JARDIANCE) 25 MG TABS tablet Take 1 tablet (25 mg total) by mouth daily before breakfast. 06/03/22   Merrily Brittle, DO  haloperidol (HALDOL) 10 MG tablet Take 1 tablet (10 mg total) by mouth every 12 (twelve) hours. 06/03/22 07/03/22  Merrily Brittle, DO  INVEGA SUSTENNA 234 MG/1.5ML injection Inject into the muscle.    [provider]  metFORMIN (GLUCOPHAGE) 1000 MG tablet Take 1 tablet (1,000 mg total) by mouth 2 (two) times daily with a meal. 06/03/22 07/03/22  Merrily Brittle, DO  nicotine polacrilex (NICORETTE) 2 MG gum Take 1 each (2 mg total) by mouth as needed for smoking cessation. 06/03/22   Merrily Brittle, DO  traZODone (DESYREL) 150 MG tablet Take 1 tablet (150 mg total) by mouth at bedtime as needed for sleep. 06/17/22   Donzel Romack, Debe Coder, MD  Past Surgical History Past Surgical History:  Procedure Laterality Date   TESTICLE IMPLANTATION TO THIGH     TESTICLE SURGERY     Family History Family History  Problem Relation Age of Onset   Schizophrenia Neg Hx     Social History Social History   Tobacco Use   Smoking status: Every Day    Packs/day: 1.00    Types: Cigarettes   Smokeless tobacco: Never  Vaping Use   Vaping Use: Never used  Substance Use Topics   Alcohol use: Not Currently    Comment: denies use 03/10/22   Drug use: Not Currently    Types: Marijuana    Comment: denies use 03/10/22   Allergies Other  Review of Systems Review of Systems  All other systems reviewed and are negative.   Physical Exam Vital Signs  I have reviewed the triage vital  signs BP 113/62 (BP Location: Right Arm)   Pulse 93   Temp 98 F (36.7 C) (Oral)   Resp 16   Ht 5' 11"$  (1.803 m)   Wt 111.1 kg   SpO2 96%   BMI 34.16 kg/m   Physical Exam Constitutional:      General: He is not in acute distress.    Appearance: Normal appearance.  HENT:     Head: Normocephalic and atraumatic.     Nose: No congestion or rhinorrhea.  Eyes:     General:        Right eye: No discharge.        Left eye: No discharge.     Extraocular Movements: Extraocular movements intact.     Pupils: Pupils are equal, round, and reactive to light.  Cardiovascular:     Rate and Rhythm: Normal rate and regular rhythm.     Heart sounds: No murmur heard. Pulmonary:     Effort: No respiratory distress.     Breath sounds: No wheezing or rales.  Abdominal:     General: There is no distension.     Tenderness: There is no abdominal tenderness.  Musculoskeletal:        General: Normal range of motion.     Cervical back: Normal range of motion.  Skin:    General: Skin is warm and dry.  Neurological:     General: No focal deficit present.     Mental Status: He is alert.     ED Results and Treatments Labs (all labs ordered are listed, but only abnormal results are displayed) Labs Reviewed  COMPREHENSIVE METABOLIC PANEL - Abnormal; Notable for the following components:      Result Value   Glucose, Bld 130 (*)    All other components within normal limits  CBC - Abnormal; Notable for the following components:   WBC 11.6 (*)    All other components within normal limits  ETHANOL  RAPID URINE DRUG SCREEN, HOSP PERFORMED  Radiology No results found.  Pertinent labs & imaging results that were available during my care of the patient were reviewed by me and considered in my medical decision making (see MDM for details).  Medications Ordered in ED Medications  - No data to display                                                                                                                                   Procedures Procedures  (including critical care time)  Medical Decision Making / ED Course   This patient presents to the ED for concern of delusions, this involves an extensive number of treatment options, and is a complaint that carries with it a high risk of complications and morbidity.  The differential diagnosis includes fixed delusions, schizophrenia, medication noncompliance, electrolyte abnormality,  MDM: Seen in the emergency room for evaluation of delusional behavior.  Physical exam is unremarkable.  Laboratory evaluation with leukocytosis to 11.6 but is otherwise unremarkable.  UDS negative and obtained in the setting of delusional behavior.  On my reevaluation patient is calm, cooperative and requesting refill of his trazodone for food and drink.  He was able to tolerate p.o. without difficulty.  He is not displaying any evidence of active psychosis and he currently is denying suicidal, homicidal ideation or auditory or visual hallucinations.  I refilled his medications and encouraged him to follow-up with his outpatient psychiatric providers.  Patient does not meet criteria for emergency psychiatric consultation and he was discharged with outpatient follow-up.   Additional history obtained:  -External records from outside source obtained and reviewed including: Chart review including previous notes, labs, imaging, consultation notes   Lab Tests: -I ordered, reviewed, and interpreted labs.   The pertinent results include:   Labs Reviewed  COMPREHENSIVE METABOLIC PANEL - Abnormal; Notable for the following components:      Result Value   Glucose, Bld 130 (*)    All other components within normal limits  CBC - Abnormal; Notable for the following components:   WBC 11.6 (*)    All other components within normal limits  ETHANOL   RAPID URINE DRUG SCREEN, HOSP PERFORMED        Medicines ordered and prescription drug management: Meds ordered this encounter  Medications   traZODone (DESYREL) 150 MG tablet    Sig: Take 1 tablet (150 mg total) by mouth at bedtime as needed for sleep.    Dispense:  30 tablet    Refill:  0    -I have reviewed the patients home medicines and have made adjustments as needed  Critical interventions none     Cardiac Monitoring: The patient was maintained on a cardiac monitor.  I personally viewed and interpreted the cardiac monitored which showed an underlying rhythm of: NSR  Social Determinants of Health:  Factors impacting patients care include: none   Reevaluation: After the interventions noted above, I reevaluated the patient and  found that they have :improved  Co morbidities that complicate the patient evaluation  Past Medical History:  Diagnosis Date   Bipolar disorder ([place])    Bronchitis    Diabetes mellitus without complication ([place])    Hyperlipidemia    Paranoid schizophrenia ([place])    Schizophrenia ([place])       Dispostion: I considered admission for this patient, but he does not meet inpatient criteria for admission he is safe for discharge with outpatient psychiatric follow-up     Final Clinical Impression(s) / ED Diagnoses Final diagnoses:  Delusion [facility])     @PCDICTATION$ @    Teressa Lower, MD 06/18/22 1256

## 2022-06-19 NOTE — ED Provider Notes (Signed)
[facility] Provider Note   CSN: [id number] Arrival date & time: 06/18/22  2217     History  Chief Complaint  Patient presents with   Dizziness    [name] is a 47 y.o. male.  47 year old male that initially checked in for "dizziness".  Patient states on my exam that he is just tired and fell asleep earlier in the day.  Has no other chief complaint.  States "I am good, just need to go to sleep".  Further history obtained.   Dizziness      Home Medications Prior to Admission medications   Medication Sig Start Date End Date Taking? Authorizing Provider  atorvastatin (LIPITOR) 40 MG tablet Take 1 tablet (40 mg total) by mouth at bedtime. 06/03/22 07/03/22  Merrily Brittle, DO  benztropine (COGENTIN) 2 MG tablet Take 1 tablet (2 mg total) by mouth every 12 (twelve) hours. 06/03/22 07/03/22  Merrily Brittle, DO  divalproex (DEPAKOTE) 500 MG DR tablet Take 1 tablet (500 mg total) by mouth every 12 (twelve) hours. 06/03/22 07/03/22  Merrily Brittle, DO  empagliflozin (JARDIANCE) 25 MG TABS tablet Take 1 tablet (25 mg total) by mouth daily before breakfast. 06/03/22   Merrily Brittle, DO  haloperidol (HALDOL) 10 MG tablet Take 1 tablet (10 mg total) by mouth every 12 (twelve) hours. 06/03/22 07/03/22  Merrily Brittle, DO  INVEGA SUSTENNA 234 MG/1.5ML injection Inject into the muscle.    [provider]  metFORMIN (GLUCOPHAGE) 1000 MG tablet Take 1 tablet (1,000 mg total) by mouth 2 (two) times daily with a meal. 06/03/22 07/03/22  Merrily Brittle, DO  nicotine polacrilex (NICORETTE) 2 MG gum Take 1 each (2 mg total) by mouth as needed for smoking cessation. 06/03/22   Merrily Brittle, DO  traZODone (DESYREL) 150 MG tablet Take 1 tablet (150 mg total) by mouth at bedtime as needed for sleep. 06/17/22   [name], Debe Coder, MD      Allergies    Other    Review of Systems   Review of Systems  Neurological:  Positive for dizziness.    Physical Exam Updated Vital  Signs BP 131/84   Pulse (!) 106   Temp 98.5 F (36.9 C) (Oral)   Resp 18   Ht 5' 11"$  (1.803 m)   Wt 111.1 kg   SpO2 99%   BMI 34.16 kg/m  Physical Exam Vitals and nursing note reviewed.  Constitutional:      Appearance: He is well-developed.  HENT:     Head: Normocephalic and atraumatic.  Eyes:     Pupils: Pupils are equal, round, and reactive to light.  Cardiovascular:     Rate and Rhythm: Normal rate.  Pulmonary:     Effort: Pulmonary effort is normal. No respiratory distress.  Abdominal:     General: There is no distension.  Musculoskeletal:        General: Normal range of motion.     Cervical back: Normal range of motion.  Skin:    General: Skin is warm.  Neurological:     General: No focal deficit present.     Mental Status: He is alert.     ED Results / Procedures / Treatments   Labs (all labs ordered are listed, but only abnormal results are displayed) Labs Reviewed  CBG MONITORING, ED - Abnormal; Notable for the following components:      Result Value   Glucose-Capillary 129 (*)    All other components within normal  limits    EKG None  Radiology No results found.  Procedures Procedures    Medications Ordered in ED Medications - No data to display  ED Course/ Medical Decision Making/ A&P                             Medical Decision Making  Emergency medical screening exam completed, no obvious emergencies.  Stable for discharge.   Final Clinical Impression(s) / ED Diagnoses Final diagnoses:  Sleep concern    Rx / DC Orders ED Discharge Orders     None         Timouthy Gilardi, Corene Cornea, MD 06/19/22 0006

## 2022-06-23 DIAGNOSIS — Z79899 Other long term (current) drug therapy: Secondary | ICD-10-CM | POA: Diagnosis not present

## 2022-06-23 DIAGNOSIS — T699XXA Effect of reduced temperature, unspecified, initial encounter: Secondary | ICD-10-CM | POA: Diagnosis not present

## 2022-06-23 DIAGNOSIS — Z0000 Encounter for general adult medical examination without abnormal findings: Secondary | ICD-10-CM | POA: Diagnosis not present

## 2022-06-23 DIAGNOSIS — F17210 Nicotine dependence, cigarettes, uncomplicated: Secondary | ICD-10-CM | POA: Diagnosis not present

## 2022-06-23 DIAGNOSIS — Z139 Encounter for screening, unspecified: Secondary | ICD-10-CM | POA: Diagnosis not present

## 2022-06-23 DIAGNOSIS — Z9109 Other allergy status, other than to drugs and biological substances: Secondary | ICD-10-CM | POA: Diagnosis not present

## 2022-06-27 ENCOUNTER — Encounter: Payer: Self-pay | Admitting: Radiology

## 2022-06-27 DIAGNOSIS — E7849 Other hyperlipidemia: Secondary | ICD-10-CM | POA: Diagnosis not present

## 2022-06-27 DIAGNOSIS — E1169 Type 2 diabetes mellitus with other specified complication: Secondary | ICD-10-CM | POA: Diagnosis not present

## 2022-06-27 DIAGNOSIS — I10 Essential (primary) hypertension: Secondary | ICD-10-CM | POA: Diagnosis not present

## 2022-07-10 ENCOUNTER — Encounter (HOSPITAL_COMMUNITY): Payer: Self-pay | Admitting: Emergency Medicine

## 2022-07-10 ENCOUNTER — Other Ambulatory Visit: Payer: Self-pay

## 2022-07-10 ENCOUNTER — Emergency Department (HOSPITAL_COMMUNITY)
Admission: EM | Admit: 2022-07-10 | Discharge: 2022-07-11 | Disposition: A | Discharge status: Home / Self Care | Payer: 59 | Attending: Emergency Medicine | Admitting: Emergency Medicine

## 2022-07-10 DIAGNOSIS — Z0000 Encounter for general adult medical examination without abnormal findings: Secondary | ICD-10-CM | POA: Insufficient documentation

## 2022-07-10 DIAGNOSIS — E119 Type 2 diabetes mellitus without complications: Secondary | ICD-10-CM | POA: Insufficient documentation

## 2022-07-10 DIAGNOSIS — R0981 Nasal congestion: Secondary | ICD-10-CM | POA: Diagnosis not present

## 2022-07-10 NOTE — ED Provider Notes (Signed)
[facility] Provider Note   CSN: [id number] Arrival date & time: 07/10/22  2310     History  Chief Complaint  Patient presents with   Nasal Congestion    [name] is a 47 y.o. male.  The history is provided by the patient.  He has history of diabetes, hyperlipidemia, schizophrenia and comes in stating that he did not go into the shelter tonight because they were not treating him fairly and that he wanted a blood test to check to see if there was anything wrong.  He is concerned that he had been eating food where it was not clean but he has no symptoms that are causing concern.  He specifically denies abdominal pain, nausea, vomiting.  He also relates that 2 weeks ago he had extensive rhinorrhea, but is no longer having problems with his nose being stuffy or runny.  He is asking if he can stay the night in the emergency department.  I asked him if he was homeless, and he states that he is not but cannot tell me why he was staying in shelters.   Home Medications Prior to Admission medications   Medication Sig Start Date End Date Taking? Authorizing Provider  atorvastatin (LIPITOR) 40 MG tablet Take 1 tablet (40 mg total) by mouth at bedtime. 06/03/22 07/03/22  Merrily Brittle, DO  benztropine (COGENTIN) 2 MG tablet Take 1 tablet (2 mg total) by mouth every 12 (twelve) hours. 06/03/22 07/03/22  Merrily Brittle, DO  divalproex (DEPAKOTE) 500 MG DR tablet Take 1 tablet (500 mg total) by mouth every 12 (twelve) hours. 06/03/22 07/03/22  Merrily Brittle, DO  empagliflozin (JARDIANCE) 25 MG TABS tablet Take 1 tablet (25 mg total) by mouth daily before breakfast. 06/03/22   Merrily Brittle, DO  haloperidol (HALDOL) 10 MG tablet Take 1 tablet (10 mg total) by mouth every 12 (twelve) hours. 06/03/22 07/03/22  Merrily Brittle, DO  INVEGA SUSTENNA 234 MG/1.5ML injection Inject into the muscle.    [provider]  metFORMIN (GLUCOPHAGE) 1000 MG tablet Take 1 tablet  (1,000 mg total) by mouth 2 (two) times daily with a meal. 06/03/22 07/03/22  Merrily Brittle, DO  nicotine polacrilex (NICORETTE) 2 MG gum Take 1 each (2 mg total) by mouth as needed for smoking cessation. 06/03/22   Merrily Brittle, DO  traZODone (DESYREL) 150 MG tablet Take 1 tablet (150 mg total) by mouth at bedtime as needed for sleep. 06/17/22   [name], Debe Coder, MD      Allergies    Other    Review of Systems   Review of Systems  All other systems reviewed and are negative.   Physical Exam Updated Vital Signs BP 125/81 (BP Location: Right Arm)   Pulse 99   Temp 98.1 F (36.7 C) (Oral)   Resp 17   Ht '5\' 11"'$  (1.803 m)   Wt 111.1 kg   SpO2 97%   BMI 34.16 kg/m  Physical Exam Vitals and nursing note reviewed.   47 year old male, resting comfortably and in no acute distress. Vital signs are normal. Oxygen saturation is 97%, which is normal. Head is normocephalic and atraumatic. PERRLA, EOMI. Oropharynx is clear. Neck is nontender and supple without adenopathy or JVD. Back is nontender and there is no CVA tenderness. Lungs are clear without rales, wheezes, or rhonchi. Chest is nontender. Heart has regular rate and rhythm without murmur. Abdomen is soft, flat, nontender. Extremities have no cyanosis or edema, full  range of motion is present. Skin is warm and dry without rash. Neurologic: Mental status is normal, cranial nerves are intact, moves all extremities equally.  ED Results / Procedures / Treatments    Procedures Procedures    Medications Ordered in ED Medications - No data to display  ED Course/ Medical Decision Making/ A&P                             Medical Decision Making  Patient apparently homeless without specific complaints.  I explained to him that there is no blood test to "look to see if something is wrong".  I have also advised him that staying in the ED overnight is not an option as the rooms are needed to treat people who are sick.  I am discharging him  with resource guide for local shelters.  Advised that I would be happy to proceed with appropriate testing if he had symptoms to the point to a specific problem.  Final Clinical Impression(s) / ED Diagnoses Final diagnoses:  Normal exam    Rx / DC Orders ED Discharge Orders     None         Delora Fuel, MD [id number]

## 2022-07-10 NOTE — ED Provider Notes (Incomplete)
[facility] Provider Note   CSN: [id number] Arrival date & time: 07/10/22  2310     History {Add pertinent medical, surgical, social history, OB history to HPI:1} Chief Complaint  Patient presents with  . Nasal Congestion    [name] is a 47 y.o. male.  The history is provided by the patient.  He has history of diabetes, hyperlipidemia, schizophrenia   Home Medications Prior to Admission medications   Medication Sig Start Date End Date Taking? Authorizing Provider  atorvastatin (LIPITOR) 40 MG tablet Take 1 tablet (40 mg total) by mouth at bedtime. 06/03/22 07/03/22  Merrily Brittle, DO  benztropine (COGENTIN) 2 MG tablet Take 1 tablet (2 mg total) by mouth every 12 (twelve) hours. 06/03/22 07/03/22  Merrily Brittle, DO  divalproex (DEPAKOTE) 500 MG DR tablet Take 1 tablet (500 mg total) by mouth every 12 (twelve) hours. 06/03/22 07/03/22  Merrily Brittle, DO  empagliflozin (JARDIANCE) 25 MG TABS tablet Take 1 tablet (25 mg total) by mouth daily before breakfast. 06/03/22   Merrily Brittle, DO  haloperidol (HALDOL) 10 MG tablet Take 1 tablet (10 mg total) by mouth every 12 (twelve) hours. 06/03/22 07/03/22  Merrily Brittle, DO  INVEGA SUSTENNA 234 MG/1.5ML injection Inject into the muscle.    [provider]  metFORMIN (GLUCOPHAGE) 1000 MG tablet Take 1 tablet (1,000 mg total) by mouth 2 (two) times daily with a meal. 06/03/22 07/03/22  Merrily Brittle, DO  nicotine polacrilex (NICORETTE) 2 MG gum Take 1 each (2 mg total) by mouth as needed for smoking cessation. 06/03/22   Merrily Brittle, DO  traZODone (DESYREL) 150 MG tablet Take 1 tablet (150 mg total) by mouth at bedtime as needed for sleep. 06/17/22   [name], Debe Coder, MD      Allergies    Other    Review of Systems   Review of Systems  All other systems reviewed and are negative.   Physical Exam Updated Vital Signs BP 125/81 (BP Location: Right Arm)   Pulse 99   Temp 98.1 F (36.7 C) (Oral)    Resp 17   Ht '5\' 11"'$  (1.803 m)   Wt 111.1 kg   SpO2 97%   BMI 34.16 kg/m  Physical Exam Vitals and nursing note reviewed.   47 year old male, resting comfortably and in no acute distress. Vital signs are ***. Oxygen saturation is ***%, which is normal. Head is normocephalic and atraumatic. PERRLA, EOMI. Oropharynx is clear. Neck is nontender and supple without adenopathy or JVD. Back is nontender and there is no CVA tenderness. Lungs are clear without rales, wheezes, or rhonchi. Chest is nontender. Heart has regular rate and rhythm without murmur. Abdomen is soft, flat, nontender without masses or hepatosplenomegaly and peristalsis is normoactive. Extremities have no cyanosis or edema, full range of motion is present. Skin is warm and dry without rash. Neurologic: Mental status is normal, cranial nerves are intact, there are no motor or sensory deficits.  ED Results / Procedures / Treatments   Labs (all labs ordered are listed, but only abnormal results are displayed) Labs Reviewed - No data to display  EKG None  Radiology No results found.  Procedures Procedures  {Document cardiac monitor, telemetry assessment procedure when appropriate:1}  Medications Ordered in ED Medications - No data to display  ED Course/ Medical Decision Making/ A&P   {   Click here for ABCD2, HEART and other calculatorsREFRESH Note before signing :1}  Medical Decision Making  ***  {Document critical care time when appropriate:1} {Document review of labs and clinical decision tools ie heart score, Chads2Vasc2 etc:1}  {Document your independent review of radiology images, and any outside records:1} {Document your discussion with family members, caretakers, and with consultants:1} {Document social determinants of health affecting pt's care:1} {Document your decision making why or why not admission, treatments were needed:1} Final Clinical Impression(s) / ED  Diagnoses Final diagnoses:  None    Rx / DC Orders ED Discharge Orders     None

## 2022-07-10 NOTE — ED Triage Notes (Signed)
Pt c/o nasal congestion for the past few days.

## 2022-07-13 ENCOUNTER — Emergency Department (HOSPITAL_COMMUNITY)
Admission: EM | Admit: 2022-07-13 | Discharge: 2022-07-13 | Disposition: A | Discharge status: Home / Self Care | Payer: 59 | Attending: Emergency Medicine | Admitting: Emergency Medicine

## 2022-07-13 ENCOUNTER — Encounter (HOSPITAL_COMMUNITY): Payer: Self-pay | Admitting: Emergency Medicine

## 2022-07-13 ENCOUNTER — Other Ambulatory Visit: Payer: Self-pay

## 2022-07-13 DIAGNOSIS — E119 Type 2 diabetes mellitus without complications: Secondary | ICD-10-CM | POA: Diagnosis not present

## 2022-07-13 DIAGNOSIS — Z139 Encounter for screening, unspecified: Secondary | ICD-10-CM

## 2022-07-13 DIAGNOSIS — Z0000 Encounter for general adult medical examination without abnormal findings: Secondary | ICD-10-CM | POA: Diagnosis not present

## 2022-07-13 NOTE — ED Provider Notes (Signed)
Emergency Department Provider Note   I have reviewed the triage vital signs and the nursing notes.   HISTORY  Chief Complaint Wants bloodwork checked   HPI [name] is a 47 y.o. male past history of diabetes, hyperlipidemia, schizophrenia with an ACT team presents to the emergency department with request for HIV screening.  He has not been sexually active since 2013 and has no plans to be sexually active.  He ultimately tells me that he is homeless and was walking around this evening, board, decided to come to the ER.  He is not having any specific complaints.  He tells me that he is in contact with his ACT team and reports taking his psychiatry medications.  He is not having any thoughts of harming himself or others.  He denies any auditory or visual hallucinations. Notes he was also having some runny nose several days ago but that has resolved.    Past Medical History:  Diagnosis Date   Bipolar disorder ([place])    Bronchitis    Diabetes mellitus without complication ([place])    Hyperlipidemia    Paranoid schizophrenia ([place])    Schizophrenia ([place])     Review of Systems  Constitutional: No fever/chills Cardiovascular: Denies chest pain. Respiratory: Denies shortness of breath. Gastrointestinal: No abdominal pain.  No nausea, no vomiting.  No diarrhea.  No constipation. Genitourinary: Negative for dysuria. Musculoskeletal: Negative for back pain. Skin: Negative for rash. Neurological: Negative for headaches.  ____________________________________________   PHYSICAL EXAM:  VITAL SIGNS: ED Triage Vitals [07/13/22 0335]  Enc Vitals Group     BP 133/86     Pulse Rate 81     Resp 18     Temp 98.4 F (36.9 C)     Temp Source Oral     SpO2 100 %     Weight 244 lb 11.4 oz (111 kg)     Height 5\' 11"  (1.803 m)   Constitutional: Alert and oriented. Well appearing and in no acute distress. Eyes: Conjunctivae are normal.  Head: Atraumatic. Nose: No  congestion/rhinnorhea. Mouth/Throat: Mucous membranes are moist. Neck: No stridor.   Cardiovascular: Normal rate, regular rhythm. Good peripheral circulation. Grossly normal heart sounds.   Respiratory: Normal respiratory effort.  No retractions. Lungs CTAB. Gastrointestinal: Soft and nontender. No distention.  Musculoskeletal: No lower extremity tenderness nor edema. No gross deformities of extremities. Neurologic:  Normal speech and language. No gross focal neurologic deficits are appreciated.  Skin:  Skin is warm, dry and intact. No rash noted. Psychiatric: Mood and affect are normal. Speech and behavior are normal.  ____________________________________________   PROCEDURES  Procedure(s) performed:   Procedures  None  ____________________________________________   INITIAL IMPRESSION / ASSESSMENT AND PLAN / ED COURSE  Pertinent labs & imaging results that were available during my care of the patient were reviewed by me and considered in my medical decision making (see chart for details).   Social Determinants of Health Risk patient is currently homeless.   Medical Decision Making: Summary:  Patient presents emergency department requesting routine screening for HIV and STI.  He has not been sexually active for the past 11 years and does not have plans to do so.  After discussion, he would like to follow with the [facility] and I provided him that information for screening.  He is calm, cooperative.  Thought process is organized.  He is not expressing any suicidal or homicidal ideation.  He ultimately tells me that he was looking  for something to do which ultimately prompted him to come to the ER.  I do not feel that he requires emergency psychiatry evaluation and is stable for discharge.   Disposition: dicharge  ____________________________________________  FINAL CLINICAL IMPRESSION(S) / ED DIAGNOSES  Final diagnoses:  Encounter for medical screening  examination    Note:  This document was prepared using Dragon voice recognition software and may include unintentional dictation errors.  Nanda Quinton, MD, [facility] Emergency Medicine    Lizann Edelman, Wonda Olds, MD 07/13/22 [phone]

## 2022-07-13 NOTE — ED Triage Notes (Signed)
Pt here stating that he wants to be checked for HIV/Aids. States he has not had sex with a male or male since 2013. States he knows how to cure AIDS if he has it. States his blood is magical. Pt has rambling speech. Talking about Boykin Reaper and Parkinsons and making sure if he gets someone pregnant, he doesn't want to give baby anything. Pt also talking about singing and dancing. Pt states he is talking to GOD while mumbling.

## 2022-07-18 DIAGNOSIS — L299 Pruritus, unspecified: Secondary | ICD-10-CM | POA: Diagnosis not present

## 2022-07-18 DIAGNOSIS — F17210 Nicotine dependence, cigarettes, uncomplicated: Secondary | ICD-10-CM | POA: Diagnosis not present

## 2022-07-23 IMAGING — MR MR HEAD W/O CM
11 of 12 series · 41 of 48 positions shown · non-contrast
Comparison: CT head from the same day.

CLINICAL DATA: Schizophrenic.  Found unresponsive.

EXAM:
MRI HEAD WITHOUT CONTRAST
TECHNIQUE: Multiplanar, multiecho pulse sequences of the brain and surrounding
structures were obtained without intravenous contrast.

[Series 5: DWI · axial · 4.0mm · 0.88mm/px · z∈[-83,+64]mm · 4 of 38 slices shown (1 of 6)]
[im 1/38]
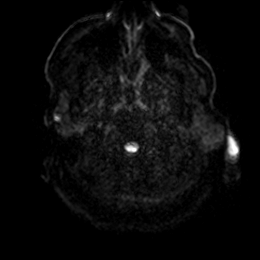
[im 13/38]
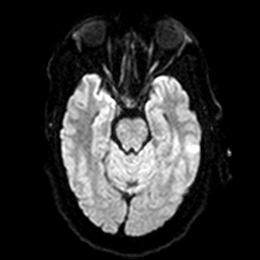
[im 25/38]
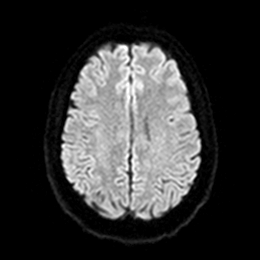
[im 38/38]
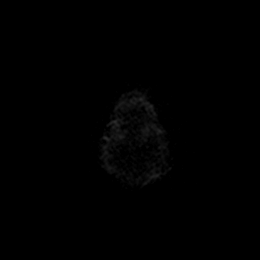

[Series 5: DWI · axial · 4.0mm · 0.88mm/px · z∈[-83,+64]mm · 5 of 38 slices shown (2 of 6)]
[im 1/38]
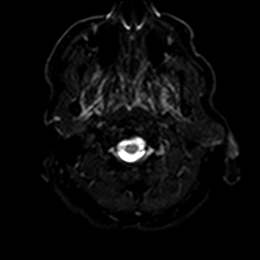
[im 10/38]
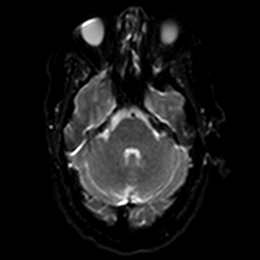
[im 19/38]
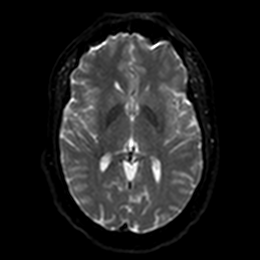
[im 28/38]
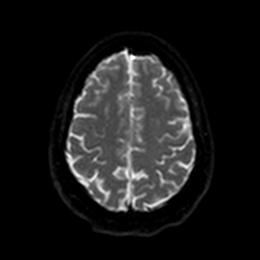
[im 38/38]
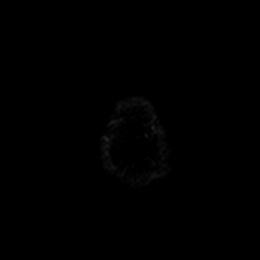

[Series 6: DWI · axial · 4.0mm · 0.88mm/px · z∈[-83,+64]mm · 5 of 38 slices shown (3 of 6)]
[im 1/38]
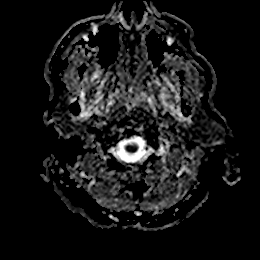
[im 10/38]
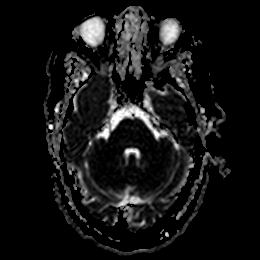
[im 19/38]
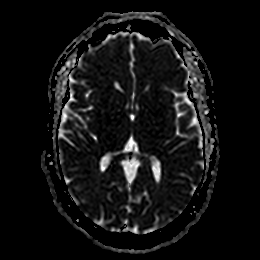
[im 28/38]
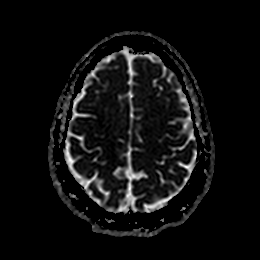
[im 38/38]
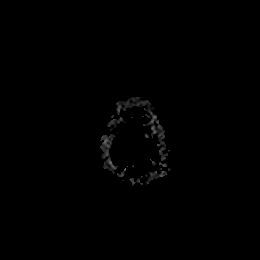

[Series 7: DWI · coronal · 4.0mm · 0.88mm/px · 4 of 34 slices shown (4 of 6)]
[im 1/34]
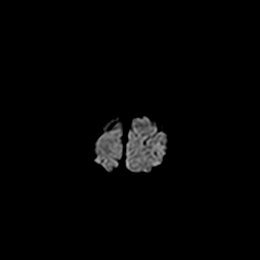
[im 12/34]
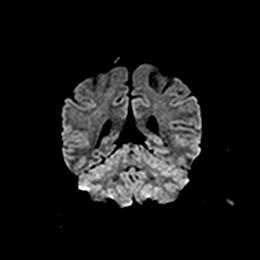
[im 23/34]
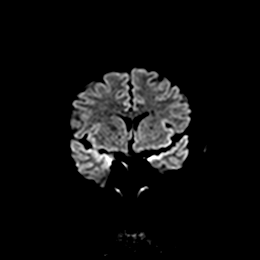
[im 34/34]
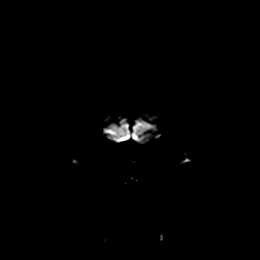

[Series 7: DWI · coronal · 4.0mm · 0.88mm/px · 4 of 34 slices shown (5 of 6)]
[im 1/34]
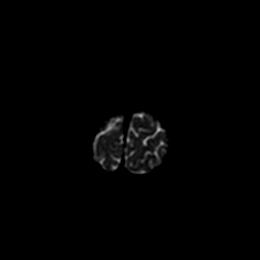
[im 12/34]
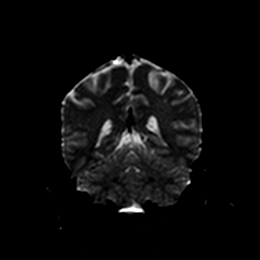
[im 23/34]
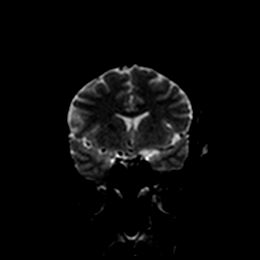
[im 34/34]
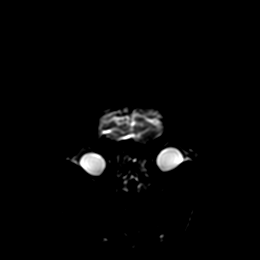

[Series 8: DWI · coronal · 4.0mm · 0.88mm/px · 4 of 34 slices shown (6 of 6)]
[im 1/34]
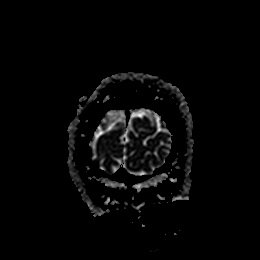
[im 12/34]
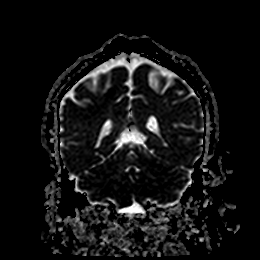
[im 23/34]
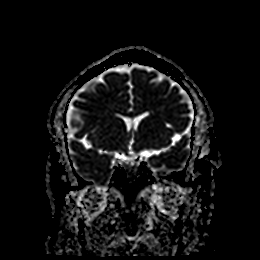
[im 34/34]
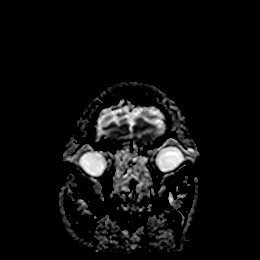

[Series 9: T1 · sagittal · 5.0mm · 0.94mm/px · 2 of 21 slices shown]
[im 1/21]
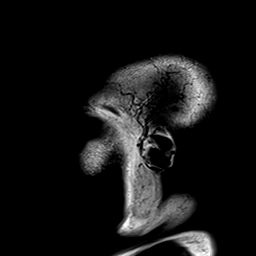
[im 21/21]
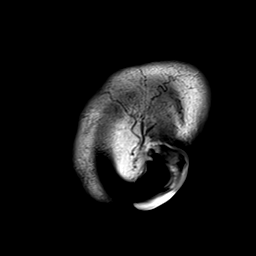

[Series 10: T2 · axial · 5.0mm · 0.72mm/px · z∈[-83,+63]mm · 3 of 22 slices shown (1 of 2)]
[im 1/22]
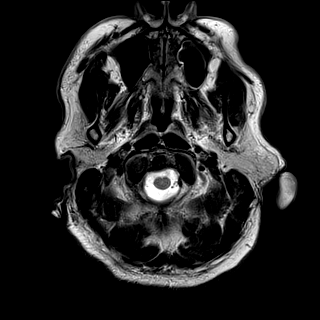
[im 11/22]
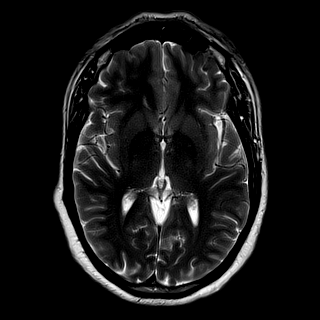
[im 22/22]
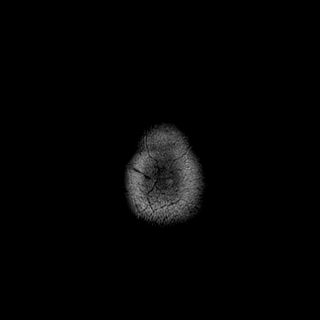

[Series 11: ax hemo · axial · 5.0mm · 0.86mm/px · z∈[-83,+60]mm · 3 of 25 slices shown]
[im 1/25]
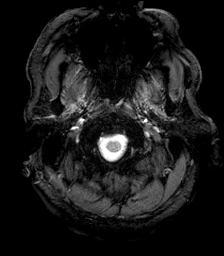
[im 13/25]
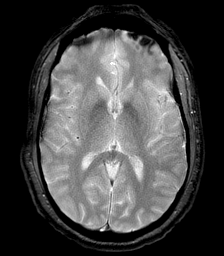
[im 25/25]
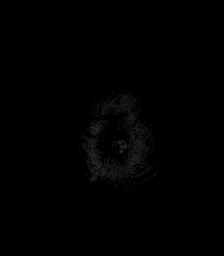

[Series 12: FLAIR · axial · 4.0mm · 0.43mm/px · z∈[-73,+50]mm · 4 of 32 slices shown]
[im 1/32]
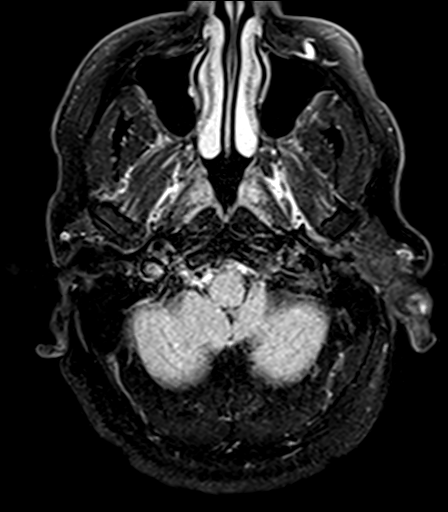
[im 11/32]
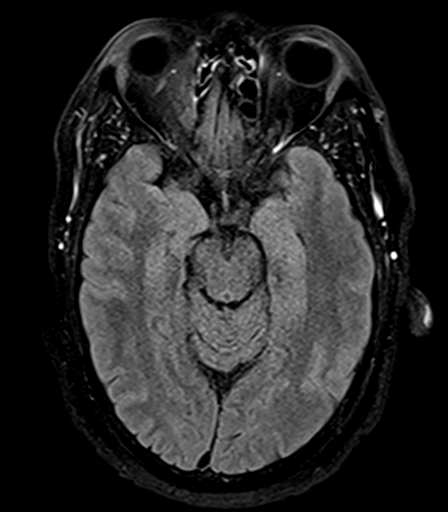
[im 21/32]
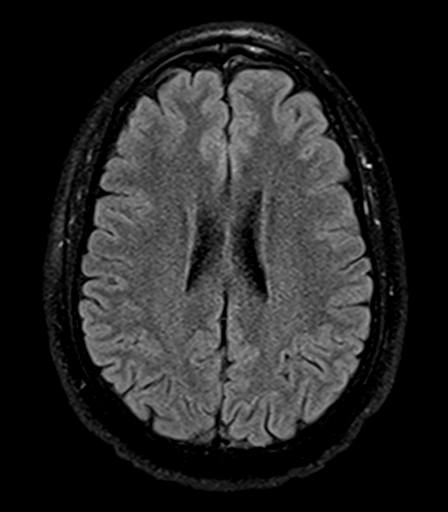
[im 32/32]
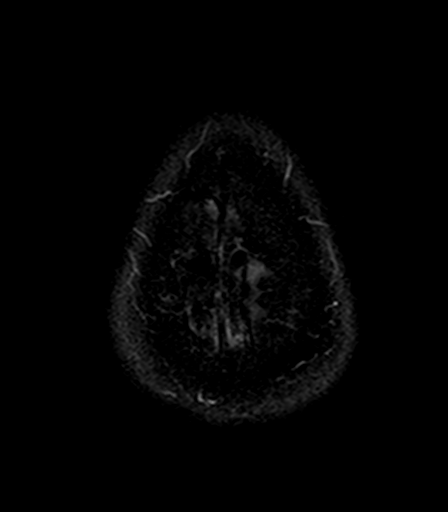

[Series 14: T2 · coronal · 5.0mm · 0.72mm/px · 3 of 28 slices shown (2 of 2)]
[im 1/28]
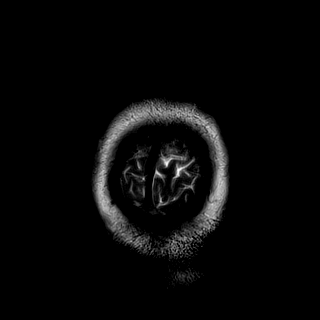
[im 14/28]
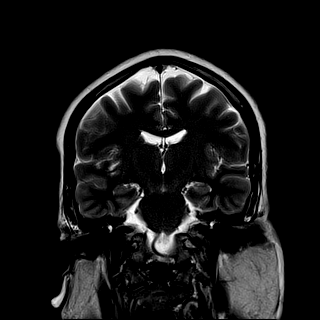
[im 28/28]
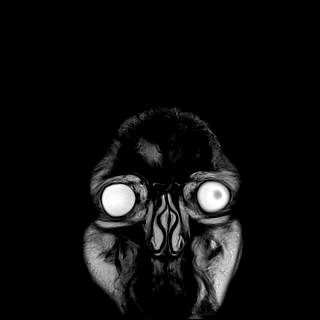

[41 of 48 positions shown; findings below may reference images not displayed]

FINDINGS: Brain: No acute infarction, hemorrhage, hydrocephalus, extra-axial
collection or mass lesion.

Vascular: Major arterial flow voids are maintained at the skull
base.

Skull and upper cervical spine: Normal marrow signal.

Sinuses/Orbits: Mild ethmoid air cell mucosal thickening. Otherwise,
sinuses are clear. Unremarkable orbits.

Other: No mastoid effusions.
IMPRESSION: No evidence of acute intracranial abnormality. Specifically, no
acute infarct.

## 2022-07-25 ENCOUNTER — Other Ambulatory Visit: Payer: Self-pay

## 2022-07-25 ENCOUNTER — Encounter (HOSPITAL_COMMUNITY): Payer: Self-pay | Admitting: Emergency Medicine

## 2022-07-25 ENCOUNTER — Emergency Department (HOSPITAL_COMMUNITY)
Admission: EM | Admit: 2022-07-25 | Discharge: 2022-07-25 | Disposition: A | Discharge status: Home / Self Care | Payer: 59 | Attending: Emergency Medicine | Admitting: Emergency Medicine

## 2022-07-25 DIAGNOSIS — Z0000 Encounter for general adult medical examination without abnormal findings: Secondary | ICD-10-CM | POA: Insufficient documentation

## 2022-07-25 DIAGNOSIS — E119 Type 2 diabetes mellitus without complications: Secondary | ICD-10-CM | POA: Diagnosis not present

## 2022-07-25 DIAGNOSIS — F17210 Nicotine dependence, cigarettes, uncomplicated: Secondary | ICD-10-CM | POA: Diagnosis not present

## 2022-07-25 LAB — CBG MONITORING, ED: Glucose-Capillary: 94 mg/dL (ref 70–99)

## 2022-07-25 NOTE — ED Triage Notes (Signed)
Pt requesting to have his blood sugar checked. Pt states he still takes metformin.

## 2022-07-25 NOTE — Discharge Instructions (Signed)
You were evaluated in the Emergency Department and after careful evaluation, we did not find any emergent condition requiring admission or further testing in the hospital.  Your exam/testing today is overall reassuring.  Please return to the Emergency Department if you experience any worsening of your condition.   Thank you for allowing us to be a part of your care. 

## 2022-07-25 NOTE — ED Provider Notes (Signed)
[facility] Emergency Department Provider Note MRN:  [id number]  Arrival date & time: 07/25/22     Chief Complaint   Wellness Check   History of Present Illness   [name] is a 47 y.o. year-old male with a history of diabetes, schizophrenia presenting to the ED with chief complaint of wellness check.  Wants his blood sugar checked.  Feels normal.  Review of Systems  A thorough review of systems was obtained and all systems are negative except as noted in the HPI and PMH.   Patient's Health History    Past Medical History:  Diagnosis Date   Bipolar disorder ([place])    Bronchitis    Diabetes mellitus without complication ([place])    Hyperlipidemia    Paranoid schizophrenia ([place])    Schizophrenia ([place])     Past Surgical History:  Procedure Laterality Date   TESTICLE IMPLANTATION TO THIGH     TESTICLE SURGERY      Family History  Problem Relation Age of Onset   Schizophrenia Neg Hx     Social History   Socioeconomic History   Marital status: Single    Spouse name: Not on file   Number of children: Not on file   Years of education: 12 years   Highest education level: 12th grade  Occupational History   Occupation: On disability  Tobacco Use   Smoking status: Every Day    Packs/day: 1    Types: Cigarettes   Smokeless tobacco: Never  Vaping Use   Vaping Use: Never used  Substance and Sexual Activity   Alcohol use: Not Currently    Comment: denies use 03/10/22   Drug use: Not Currently    Types: Marijuana    Comment: denies use 03/10/22   Sexual activity: Yes    Birth control/protection: Condom  Other Topics Concern   Not on file  Social History Narrative   Pt lives alone in apartment complex for mentally ill   Social Determinants of Health   Financial Resource Strain: Not on file  Food Insecurity: Patient Declined (05/24/2022)   Hunger Vital Sign    Worried About Running Out of Food in the Last Year: Patient declined    [place] in the Last Year: Patient declined  Transportation Needs: Patient Declined (05/24/2022)   PRAPARE - Hydrologist (Medical): Patient declined    Lack of Transportation (Non-Medical): Patient declined  Physical Activity: Not on file  Stress: Not on file  Social Connections: Not on file  Intimate Partner Violence: Patient Declined (05/24/2022)   Humiliation, Afraid, Rape, and Kick questionnaire    Fear of Current or Ex-Partner: Patient declined    Emotionally Abused: Patient declined    Physically Abused: Patient declined    Sexually Abused: Patient declined     Physical Exam   Vitals:   07/25/22 0427  BP: 132/84  Pulse: 89  Resp: 18  Temp: 97.9 F (36.6 C)  SpO2: 98%    CONSTITUTIONAL: Well-appearing, NAD NEURO/PSYCH:  Alert and oriented x 3, no focal deficits EYES:  eyes equal and reactive ENT/NECK:  no LAD, no JVD CARDIO: Regular rate, well-perfused, normal S1 and S2 PULM:  CTAB no wheezing or rhonchi GI/GU:  non-distended, non-tender MSK/SPINE:  No gross deformities, no edema SKIN:  no rash, atraumatic   *Additional and/or pertinent findings included in MDM below  Diagnostic and Interventional Summary    EKG Interpretation  Date/Time:    Ventricular  Rate:    PR Interval:    QRS Duration:   QT Interval:    QTC Calculation:   R Axis:     Text Interpretation:         Labs Reviewed  CBG MONITORING, ED    No orders to display    Medications - No data to display   Procedures  /  Critical Care Procedures  ED Course and Medical Decision Making  Initial Impression and Ddx Patient here without complaints, simply wants his blood sugar checked.  Frequent ED visitor stemming from underlying psychiatric illness.  Seems to be at his baseline, not a danger to self or others.  Past medical/surgical history that increases complexity of ED encounter: Schizophrenia  Interpretation of Diagnostics Blood sugar  reassuring  Patient Reassessment and Ultimate Disposition/Management     Discharge  Patient management required discussion with the following services or consulting groups:  None  Complexity of Problems Addressed Acute complicated illness or Injury  Additional Data Reviewed and Analyzed Further history obtained from: Prior ED visit notes  Additional Factors Impacting ED Encounter Risk None  Barth Kirks. Sedonia Small, MD [place] [email or web address]  Final Clinical Impressions(s) / ED Diagnoses     ICD-10-CM   1. Well adult health check  Z00.00       ED Discharge Orders     None        Discharge Instructions Discussed with and Provided to Patient:    Discharge Instructions      You were evaluated in the Emergency Department and after careful evaluation, we did not find any emergent condition requiring admission or further testing in the hospital.  Your exam/testing today is overall reassuring.  Please return to the Emergency Department if you experience any worsening of your condition.   Thank you for allowing [place] to be a part of your care.      Maudie Flakes, MD 07/25/22 [phone]

## 2022-07-27 ENCOUNTER — Emergency Department (HOSPITAL_COMMUNITY)
Admission: EM | Admit: 2022-07-27 | Discharge: 2022-07-27 | Disposition: A | Discharge status: Home / Self Care | Payer: 59 | Attending: Emergency Medicine | Admitting: Emergency Medicine

## 2022-07-27 ENCOUNTER — Encounter (HOSPITAL_COMMUNITY): Payer: Self-pay | Admitting: Emergency Medicine

## 2022-07-27 ENCOUNTER — Other Ambulatory Visit: Payer: Self-pay

## 2022-07-27 DIAGNOSIS — R21 Rash and other nonspecific skin eruption: Secondary | ICD-10-CM | POA: Diagnosis not present

## 2022-07-27 MED ORDER — CLOTRIMAZOLE 1 % EX CREA: TOPICAL_CREAM | CUTANEOUS | 0 refills | Status: AC

## 2022-07-27 NOTE — ED Triage Notes (Signed)
Pt c/o rash to abdomen.Pt applied powder to rash. Denies pain or any other concern. Bryson Corona Edd Fabian

## 2022-07-27 NOTE — ED Provider Notes (Signed)
[facility] Provider Note   CSN: [id number] Arrival date & time: 07/27/22  0343     History  Chief Complaint  Patient presents with   Rash    [name] is a 47 y.o. male.  Patient complains of a rash on his abdomen.  He thinks he got up because he drank about Pepsi.  He says it might be ringworm.  Putting some kind of white powder on it, it did not help.       Home Medications Prior to Admission medications   Medication Sig Start Date End Date Taking? Authorizing Provider  clotrimazole (LOTRIMIN) 1 % cream Apply to affected area 2 times daily 07/27/22  Yes Ashok Sawaya, Gwenyth Allegra, MD  atorvastatin (LIPITOR) 40 MG tablet Take 1 tablet (40 mg total) by mouth at bedtime. 06/03/22 07/03/22  Merrily Brittle, DO  benztropine (COGENTIN) 2 MG tablet Take 1 tablet (2 mg total) by mouth every 12 (twelve) hours. 06/03/22 07/03/22  Merrily Brittle, DO  divalproex (DEPAKOTE) 500 MG DR tablet Take 1 tablet (500 mg total) by mouth every 12 (twelve) hours. 06/03/22 07/03/22  Merrily Brittle, DO  empagliflozin (JARDIANCE) 25 MG TABS tablet Take 1 tablet (25 mg total) by mouth daily before breakfast. 06/03/22   Merrily Brittle, DO  haloperidol (HALDOL) 10 MG tablet Take 1 tablet (10 mg total) by mouth every 12 (twelve) hours. 06/03/22 07/03/22  Merrily Brittle, DO  INVEGA SUSTENNA 234 MG/1.5ML injection Inject into the muscle.    [provider]  metFORMIN (GLUCOPHAGE) 1000 MG tablet Take 1 tablet (1,000 mg total) by mouth 2 (two) times daily with a meal. 06/03/22 07/03/22  Merrily Brittle, DO  nicotine polacrilex (NICORETTE) 2 MG gum Take 1 each (2 mg total) by mouth as needed for smoking cessation. 06/03/22   Merrily Brittle, DO  traZODone (DESYREL) 150 MG tablet Take 1 tablet (150 mg total) by mouth at bedtime as needed for sleep. 06/17/22   [name], Debe Coder, MD      Allergies    Other    Review of Systems   Review of Systems  Physical Exam Updated Vital Signs BP  103/85 (BP Location: Left Arm)   Pulse 80   Temp 97.7 F (36.5 C) (Oral)   Resp 18   SpO2 99%  Physical Exam Vitals and nursing note reviewed.  Constitutional:      General: He is not in acute distress.    Appearance: He is well-developed.  HENT:     Head: Normocephalic and atraumatic.     Mouth/Throat:     Mouth: Mucous membranes are moist.  Eyes:     General: Vision grossly intact. Gaze aligned appropriately.     Extraocular Movements: Extraocular movements intact.     Conjunctiva/sclera: Conjunctivae normal.  Cardiovascular:     Rate and Rhythm: Normal rate and regular rhythm.     Pulses: Normal pulses.     Heart sounds: Normal heart sounds, S1 normal and S2 normal. No murmur heard.    No friction rub. No gallop.  Pulmonary:     Effort: Pulmonary effort is normal. No respiratory distress.     Breath sounds: Normal breath sounds.  Abdominal:     Palpations: Abdomen is soft.     Tenderness: There is no abdominal tenderness. There is no guarding or rebound.     Hernia: No hernia is present.  Musculoskeletal:        General: No swelling.  Cervical back: Full passive range of motion without pain, normal range of motion and neck supple. No pain with movement, spinous process tenderness or muscular tenderness. Normal range of motion.     Right lower leg: No edema.     Left lower leg: No edema.  Skin:    General: Skin is warm and dry.     Capillary Refill: Capillary refill takes less than 2 seconds.     Findings: Rash (2 small subcentimeter slightly hyper pig then did, nonraised lesions on on abdomen) present. No ecchymosis, erythema, lesion or wound.  Neurological:     Mental Status: He is alert and oriented to person, place, and time.     GCS: GCS eye subscore is 4. GCS verbal subscore is 5. GCS motor subscore is 6.     Cranial Nerves: Cranial nerves 2-12 are intact.     Sensory: Sensation is intact.     Motor: Motor function is intact. No weakness or abnormal muscle  tone.     Coordination: Coordination is intact.  Psychiatric:        Mood and Affect: Mood normal.        Speech: Speech normal.        Behavior: Behavior normal.     ED Results / Procedures / Treatments   Labs (all labs ordered are listed, but only abnormal results are displayed) Labs Reviewed - No data to display  EKG None  Radiology No results found.  Procedures Procedures    Medications Ordered in ED Medications - No data to display  ED Course/ Medical Decision Making/ A&P                             Medical Decision Making  Patient with 2 lesions on abdomen that could be tinea versicolor versus normal for him.  Treat with clotrimazole.        Final Clinical Impression(s) / ED Diagnoses Final diagnoses:  Rash    Rx / DC Orders ED Discharge Orders          Ordered    clotrimazole (LOTRIMIN) 1 % cream        07/27/22 0413              Orpah Greek, MD 07/27/22 [phone]

## 2022-07-27 NOTE — ED Notes (Signed)
Pt alert, oriented, and ambulatory upon DC. Yeslin Delio Serrita Lueth,RN 

## 2022-07-30 ENCOUNTER — Emergency Department (HOSPITAL_COMMUNITY)
Admission: EM | Admit: 2022-07-30 | Discharge: 2022-07-30 | Discharge status: Against Medical Advice | Payer: 59 | Attending: Emergency Medicine | Admitting: Emergency Medicine

## 2022-07-30 ENCOUNTER — Encounter (HOSPITAL_COMMUNITY): Payer: Self-pay

## 2022-07-30 ENCOUNTER — Other Ambulatory Visit: Payer: Self-pay

## 2022-07-30 DIAGNOSIS — Z5321 Procedure and treatment not carried out due to patient leaving prior to being seen by health care provider: Secondary | ICD-10-CM | POA: Diagnosis not present

## 2022-07-30 DIAGNOSIS — M79641 Pain in right hand: Secondary | ICD-10-CM | POA: Insufficient documentation

## 2022-07-30 DIAGNOSIS — M79642 Pain in left hand: Secondary | ICD-10-CM | POA: Insufficient documentation

## 2022-07-30 NOTE — ED Triage Notes (Signed)
Pt complaining of his hands burning feels like needles are poking him. He is also talking about people wanting to steal his medication.
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
## 2022-08-01 ENCOUNTER — Encounter (HOSPITAL_COMMUNITY): Payer: Self-pay

## 2022-08-01 ENCOUNTER — Emergency Department (HOSPITAL_COMMUNITY)
Admission: EM | Admit: 2022-08-01 | Discharge: 2022-08-02 | Disposition: A | Discharge status: Other Acute Inpatient Hospital | Payer: 59 | Attending: Emergency Medicine | Admitting: Emergency Medicine

## 2022-08-01 ENCOUNTER — Other Ambulatory Visit: Payer: Self-pay

## 2022-08-01 DIAGNOSIS — F22 Delusional disorders: Secondary | ICD-10-CM | POA: Diagnosis not present

## 2022-08-01 DIAGNOSIS — Z79899 Other long term (current) drug therapy: Secondary | ICD-10-CM | POA: Insufficient documentation

## 2022-08-01 DIAGNOSIS — F209 Schizophrenia, unspecified: Secondary | ICD-10-CM | POA: Diagnosis not present

## 2022-08-01 DIAGNOSIS — Z5900 Homelessness unspecified: Secondary | ICD-10-CM | POA: Diagnosis not present

## 2022-08-01 DIAGNOSIS — R440 Auditory hallucinations: Secondary | ICD-10-CM | POA: Diagnosis present

## 2022-08-01 LAB — CBC
HCT: 42.8 % (ref 39.0–52.0)
Hemoglobin: 14.5 g/dL (ref 13.0–17.0)
MCH: 29.5 pg (ref 26.0–34.0)
MCHC: 33.9 g/dL (ref 30.0–36.0)
MCV: 87.2 fL (ref 80.0–100.0)
Platelets: 306 10*3/uL (ref 150–400)
RBC: 4.91 MIL/uL (ref 4.22–5.81)
RDW: 12.2 % (ref 11.5–15.5)
WBC: 10.3 10*3/uL (ref 4.0–10.5)
nRBC: 0 % (ref 0.0–0.2)

## 2022-08-01 LAB — RAPID URINE DRUG SCREEN, HOSP PERFORMED
Amphetamines: NOT DETECTED
Barbiturates: NOT DETECTED
Benzodiazepines: NOT DETECTED
Cocaine: NOT DETECTED
Opiates: NOT DETECTED
Tetrahydrocannabinol: NOT DETECTED

## 2022-08-01 LAB — COMPREHENSIVE METABOLIC PANEL
ALT: 27 U/L (ref 0–44)
AST: 28 U/L (ref 15–41)
Albumin: 4 g/dL (ref 3.5–5.0)
Alkaline Phosphatase: 61 U/L (ref 38–126)
Anion gap: 9 (ref 5–15)
BUN: 10 mg/dL (ref 6–20)
CO2: 24 mmol/L (ref 22–32)
Calcium: 8.9 mg/dL (ref 8.9–10.3)
Chloride: 102 mmol/L (ref 98–111)
Creatinine, Ser: 0.9 mg/dL (ref 0.61–1.24)
GFR, Estimated: >60 mL/min (ref >60)
Glucose, Bld: 94 mg/dL (ref 70–99)
Potassium: 4 mmol/L (ref 3.5–5.1)
Sodium: 135 mmol/L (ref 135–145)
Total Bilirubin: 0.6 mg/dL (ref 0.3–1.2)
Total Protein: 7 g/dL (ref 6.5–8.1)

## 2022-08-01 LAB — ETHANOL: Alcohol, Ethyl (B): <10 mg/dL (ref <10)

## 2022-08-01 LAB — SALICYLATE LEVEL: Salicylate Lvl: <7 mg/dL — ABNORMAL LOW (ref 7.0–30.0)

## 2022-08-01 LAB — CBG MONITORING, ED: Glucose-Capillary: 95 mg/dL (ref 70–99)

## 2022-08-01 LAB — ACETAMINOPHEN LEVEL: Acetaminophen (Tylenol), Serum: <10 ug/mL — ABNORMAL LOW (ref 10–30)

## 2022-08-01 MED ORDER — ATORVASTATIN CALCIUM 40 MG PO TABS
40.0000 mg | ORAL_TABLET | Freq: Every day | ORAL | Status: DC
  Administered 2022-08-01: 40 mg via ORAL
  Filled 2022-08-01: qty 1

## 2022-08-01 MED ORDER — BENZTROPINE MESYLATE 1 MG PO TABS
2.0000 mg | ORAL_TABLET | Freq: Two times a day (BID) | ORAL | Status: DC
  Administered 2022-08-01 – 2022-08-02 (×3): 2 mg via ORAL
  Filled 2022-08-01 (×3): qty 2

## 2022-08-01 MED ORDER — TRAZODONE HCL 50 MG PO TABS: 150.0000 mg | ORAL_TABLET | Freq: Every evening | ORAL | Status: DC | PRN

## 2022-08-01 MED ORDER — DIVALPROEX SODIUM 250 MG PO DR TAB
500.0000 mg | DELAYED_RELEASE_TABLET | Freq: Two times a day (BID) | ORAL | Status: DC
  Administered 2022-08-01 – 2022-08-02 (×3): 500 mg via ORAL
  Filled 2022-08-01 (×3): qty 2

## 2022-08-01 MED ORDER — EMPAGLIFLOZIN 25 MG PO TABS
25.0000 mg | ORAL_TABLET | Freq: Every day | ORAL | Status: DC
  Administered 2022-08-02: 25 mg via ORAL
  Filled 2022-08-01: qty 1

## 2022-08-01 MED ORDER — METFORMIN HCL 500 MG PO TABS
1000.0000 mg | ORAL_TABLET | Freq: Two times a day (BID) | ORAL | Status: DC
  Administered 2022-08-01 – 2022-08-02 (×2): 1000 mg via ORAL
  Filled 2022-08-01 (×2): qty 2

## 2022-08-01 MED ORDER — ACETAMINOPHEN 325 MG PO TABS
650.0000 mg | ORAL_TABLET | ORAL | Status: DC | PRN
  Administered 2022-08-01: 650 mg via ORAL
  Filled 2022-08-01: qty 2

## 2022-08-01 MED ORDER — HALOPERIDOL 5 MG PO TABS
10.0000 mg | ORAL_TABLET | Freq: Two times a day (BID) | ORAL | Status: DC
  Administered 2022-08-01 – 2022-08-02 (×3): 10 mg via ORAL
  Filled 2022-08-01 (×3): qty 2

## 2022-08-01 NOTE — ED Triage Notes (Signed)
Pt brought in by [employer] under IVC from his father. Per paperwork pt has been evicted from [place] apartments and is on the streets. Pt is "hearing voices" and believes others can hear the voices as well. Pt has hx of schizophrenia. Pt requesting money or food from ppl he know per paperwork. Per petitioner "father" pt is not compliant with his medications.

## 2022-08-01 NOTE — ED Notes (Signed)
Pt was accepted to [facility] 08/01/2022, pending IVC paperwork faxed to [phone].  Pt meets inpatient criteria per Beatriz Stallion, NP  Attending Physician will be Gerrit Halls, MD  Report can be called to: [phone]  Pt can arrive before 8 PM

## 2022-08-01 NOTE — ED Notes (Signed)
Pt is on the bed at this time coloring

## 2022-08-01 NOTE — ED Notes (Signed)
RDP signed off at this time.
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
## 2022-08-01 NOTE — ED Notes (Signed)
Pt changed into BH scrubs, very cooperative. Pt belongings placed into locker number 1 include (shoes, shorts, a tshirt, tennis shoes, and hat) Pt notified that staff will need a urine sample.

## 2022-08-01 NOTE — Progress Notes (Signed)
LCSW Progress Note  [id number]   [name]  08/01/2022  2:09 PM  Description:   Inpatient Psychiatric Referral  Patient was recommended inpatient per Beatriz Stallion, NP. There are no available beds at [facility]. Patient was referred to the following facilities:   Destination  Service Provider Address Phone Fax  [place]  [address] [place] [address] [phone]  [place], [place] [phone]  [facility]  [place], [place] [address] [phone] [place]  [address] [place] [address] [phone]  [facility]  [place], [place] [address] [phone]  [facility]  [address] [place] [address] [phone] [place]  [place], [place] [address] [phone] [facility]  [id number] [place] [address] [phone] [facility]  [address] [place], [place] [address] [phone] [address] [place] [address] [place]  [facility]  [address] [place] [address] [phone]  [facility]  [address] [place] [address] [phone]  [place] [address] [place] [address] [phone]  [facility]  [place] [address] [phone]  [facility]  [address], [place] [address] [phone] [facility]  [address], [place] [address] [phone] [facility]  [address], [place] [address] [place]  [address] [place] [address] [phone]  [facility]  [address] [place] [address] [place]  [facility]  [address], [place] [address] [phone] [place]  [address], [place] [address] [phone]  [address] [place] [address] [id number]  [facility]  [address], [place] [address] [phone]  [facility]  [address] [place] [address] [phone]    Situation ongoing, CSW to continue following and update chart as more information becomes available.      Denna Haggard, [place]  08/01/2022 2:09 PM

## 2022-08-01 NOTE — ED Notes (Signed)
Pt was caught  masturbating while laying on the bed in the hallway. And was asked to please stop, and to keep his hands to where they could be seen.

## 2022-08-01 NOTE — ED Provider Notes (Signed)
[facility] Provider Note   CSN: [id number] Arrival date & time: 08/01/22  0813     History  Chief Complaint  Patient presents with   v 70.1    [name] is a 47 y.o. male  with history of schizophrenia presenting for exacerbation of this condition, he was involuntarily committed by his father who is concerned about his safety.  The patient is currently homeless and has been wandering the streets, also is not compliant with his medications and has been having visual and auditory hallucinations.  The history is provided by the patient and a relative (father has filed commitment papers).       Home Medications Prior to Admission medications   Medication Sig Start Date End Date Taking? Authorizing Provider  atorvastatin (LIPITOR) 40 MG tablet Take 1 tablet (40 mg total) by mouth at bedtime. 06/03/22 07/03/22  Merrily Brittle, DO  benztropine (COGENTIN) 2 MG tablet Take 1 tablet (2 mg total) by mouth every 12 (twelve) hours. 06/03/22 07/03/22  Merrily Brittle, DO  clotrimazole (LOTRIMIN) 1 % cream Apply to affected area 2 times daily 07/27/22   Orpah Greek, MD  divalproex (DEPAKOTE) 500 MG DR tablet Take 1 tablet (500 mg total) by mouth every 12 (twelve) hours. 06/03/22 07/03/22  Merrily Brittle, DO  empagliflozin (JARDIANCE) 25 MG TABS tablet Take 1 tablet (25 mg total) by mouth daily before breakfast. 06/03/22   Merrily Brittle, DO  haloperidol (HALDOL) 10 MG tablet Take 1 tablet (10 mg total) by mouth every 12 (twelve) hours. 06/03/22 07/03/22  Merrily Brittle, DO  INVEGA SUSTENNA 234 MG/1.5ML injection Inject into the muscle.    [provider]  metFORMIN (GLUCOPHAGE) 1000 MG tablet Take 1 tablet (1,000 mg total) by mouth 2 (two) times daily with a meal. 06/03/22 07/03/22  Merrily Brittle, DO  nicotine polacrilex (NICORETTE) 2 MG gum Take 1 each (2 mg total) by mouth as needed for smoking cessation. 06/03/22   Merrily Brittle, DO  traZODone (DESYREL)  150 MG tablet Take 1 tablet (150 mg total) by mouth at bedtime as needed for sleep. 06/17/22   [name], Debe Coder, MD      Allergies    Other    Review of Systems   Review of Systems  Constitutional:  Negative for fever.  HENT:  Negative for congestion and sore throat.   Eyes: Negative.   Respiratory:  Negative for chest tightness and shortness of breath.   Cardiovascular:  Negative for chest pain.  Gastrointestinal:  Negative for abdominal pain and nausea.  Genitourinary: Negative.   Musculoskeletal:  Negative for arthralgias, joint swelling and neck pain.  Skin: Negative.  Negative for rash and wound.  Neurological:  Negative for dizziness, weakness, light-headedness, numbness and headaches.  Psychiatric/Behavioral: Negative.         Patient denies any psych concerns at this time, he does not endorse auditory or visual hallucinations.  Denies SI or HI.    Physical Exam Updated Vital Signs BP (!) 157/99 (BP Location: Left Arm)   Pulse 83   Temp 98.2 F (36.8 C) (Oral)   Resp 16   Ht 5\' 11"  (1.803 m)   Wt 110.6 kg   SpO2 100%   BMI 34.01 kg/m  Physical Exam Vitals and nursing note reviewed.  Constitutional:      Appearance: He is well-developed.  HENT:     Head: Normocephalic and atraumatic.  Eyes:     Conjunctiva/sclera: Conjunctivae normal.  Cardiovascular:     Rate and Rhythm: Normal rate and regular rhythm.     Heart sounds: Normal heart sounds.  Pulmonary:     Effort: Pulmonary effort is normal.     Breath sounds: Normal breath sounds. No wheezing.  Abdominal:     General: Bowel sounds are normal.     Palpations: Abdomen is soft.     Tenderness: There is no abdominal tenderness.  Musculoskeletal:        General: Normal range of motion.     Cervical back: Normal range of motion.  Skin:    General: Skin is warm and dry.  Neurological:     General: No focal deficit present.     Mental Status: He is alert.  Psychiatric:        Mood and Affect: Affect is  blunt.        Speech: Speech normal.        Behavior: Behavior is cooperative.        Thought Content: Thought content is delusional. Thought content does not include homicidal or suicidal ideation.        Judgment: Judgment is impulsive.     ED Results / Procedures / Treatments   Labs (all labs ordered are listed, but only abnormal results are displayed) Labs Reviewed  SALICYLATE LEVEL - Abnormal; Notable for the following components:      Result Value   Salicylate Lvl <7.0 (*)    All other components within normal limits  ACETAMINOPHEN LEVEL - Abnormal; Notable for the following components:   Acetaminophen (Tylenol), Serum <10 (*)    All other components within normal limits  COMPREHENSIVE METABOLIC PANEL  ETHANOL  CBC  RAPID URINE DRUG SCREEN, HOSP PERFORMED    EKG None  Radiology No results found.  Procedures Procedures    Medications Ordered in ED Medications  acetaminophen (TYLENOL) tablet 650 mg (has no administration in time range)  benztropine (COGENTIN) tablet 2 mg (has no administration in time range)  atorvastatin (LIPITOR) tablet 40 mg (has no administration in time range)  divalproex (DEPAKOTE) DR tablet 500 mg (has no administration in time range)  empagliflozin (JARDIANCE) tablet 25 mg (has no administration in time range)  haloperidol (HALDOL) tablet 10 mg (has no administration in time range)  metFORMIN (GLUCOPHAGE) tablet 1,000 mg (has no administration in time range)  traZODone (DESYREL) tablet 150 mg (has no administration in time range)    ED Course/ Medical Decision Making/ A&P                             Medical Decision Making Patient presenting with suspected schizophrenia flare, patient is been noncompliant with his medications.  Once he is medically cleared we will plan a TTS consult.  Amount and/or Complexity of Data Reviewed Labs: ordered.    Details: Labs reviewed and stable.           Final Clinical Impression(s) / ED  Diagnoses Final diagnoses:  Schizophrenia, unspecified type    Rx / DC Orders ED Discharge Orders     None         Victoriano Lain 08/01/22 1017    Vanetta Mulders, MD 08/02/22 1630

## 2022-08-01 NOTE — ED Notes (Signed)
Faxed IVC paperwork to [facility]

## 2022-08-01 NOTE — ED Notes (Signed)
Attempted x's 2 to call report to [facility] with no answer. Reached out to [facility] to see if there is another contact number at this time.

## 2022-08-01 NOTE — ED Notes (Signed)
IVC paperwork faxed to [facility] at this time.

## 2022-08-01 NOTE — ED Notes (Signed)
Pt is in restroom at this time

## 2022-08-01 NOTE — ED Notes (Signed)
Pt given a meal at this time 

## 2022-08-01 NOTE — ED Notes (Addendum)
Pt is aware that pt hands need to be seen at all times.

## 2022-08-01 NOTE — Progress Notes (Signed)
Pt will transfer to [facility] 08/02/22 morning due to pt being IVC'd and must transport via [facility]-[name] bed will be held per [facility], [place].  CSW confirmed with Jenny Reichmann from [facility] that the IVC paperwork has been received. Jenny Reichmann has requested that pt be transported in the morning since pt will not be able to arrive this evening by 8pm. Jenny Reichmann informed that pt's bed will be held and this CSW updated care team and nursing, Noralyn Pick, RN confirmed understanding. CSW/ Disposition team will assist and follow with placement.  Benjaman Kindler, MSW, [facility] 08/01/2022 6:12 PM

## 2022-08-01 NOTE — Progress Notes (Signed)
Pt was accepted to [facility] 08/01/2022, pending IVC paperwork faxed to [phone].  Pt meets inpatient criteria per Beatriz Stallion, NP  Attending Physician will be Gerrit Halls, MD  Report can be called to: [phone]  Pt can arrive before 8 PM  Care Team Notified: Beatriz Stallion, NP, Minna Antis, [facility], Noralyn Pick, RN, and [address] [place], [place]  08/01/2022 4:29 PM

## 2022-08-01 NOTE — ED Notes (Signed)
TTS at bedside. 

## 2022-08-01 NOTE — ED Notes (Signed)
Chelsea with [facility] was able to get in touch of [place]. Patient needs to have report called in the morning since he will be unable to get to facility by 2000.

## 2022-08-01 NOTE — ED Notes (Signed)
IVC Paperwork Faxed to [facility] [phone].

## 2022-08-01 NOTE — ED Notes (Signed)
Pt changed into hospital attired, security called to wand pt at this time.

## 2022-08-01 NOTE — ED Notes (Signed)
IVC Paperwork refaxed to [facility] @ [phone].

## 2022-08-02 DIAGNOSIS — F209 Schizophrenia, unspecified: Secondary | ICD-10-CM | POA: Diagnosis not present

## 2022-08-02 NOTE — ED Notes (Signed)
Verbal order received to give pt all his po meds prior to transport per [facility] staff request during report.

## 2022-08-02 NOTE — ED Notes (Signed)
Pt asleep vs deferred until am.  

## 2022-08-02 NOTE — ED Notes (Signed)
Pt leaving with [employer] with luggage and paperwork.

## 2022-08-02 NOTE — ED Notes (Signed)
[facility] called to arrange transport of patient to [facility] to be taken to [facility]

## 2022-08-03 DIAGNOSIS — Z79899 Other long term (current) drug therapy: Secondary | ICD-10-CM | POA: Diagnosis not present

## 2022-08-03 DIAGNOSIS — Z5181 Encounter for therapeutic drug level monitoring: Secondary | ICD-10-CM | POA: Diagnosis not present

## 2022-08-03 DIAGNOSIS — E785 Hyperlipidemia, unspecified: Secondary | ICD-10-CM | POA: Diagnosis not present

## 2022-08-03 DIAGNOSIS — Z119 Encounter for screening for infectious and parasitic diseases, unspecified: Secondary | ICD-10-CM | POA: Diagnosis not present

## 2022-08-03 DIAGNOSIS — I10 Essential (primary) hypertension: Secondary | ICD-10-CM | POA: Diagnosis not present

## 2022-08-03 DIAGNOSIS — F17200 Nicotine dependence, unspecified, uncomplicated: Secondary | ICD-10-CM | POA: Diagnosis not present

## 2022-08-03 DIAGNOSIS — E118 Type 2 diabetes mellitus with unspecified complications: Secondary | ICD-10-CM | POA: Diagnosis not present

## 2022-08-03 DIAGNOSIS — N390 Urinary tract infection, site not specified: Secondary | ICD-10-CM | POA: Diagnosis not present

## 2022-08-04 DIAGNOSIS — E785 Hyperlipidemia, unspecified: Secondary | ICD-10-CM | POA: Diagnosis not present

## 2022-08-04 DIAGNOSIS — E119 Type 2 diabetes mellitus without complications: Secondary | ICD-10-CM | POA: Diagnosis not present

## 2022-08-05 DIAGNOSIS — R030 Elevated blood-pressure reading, without diagnosis of hypertension: Secondary | ICD-10-CM | POA: Diagnosis not present

## 2022-08-06 DIAGNOSIS — R030 Elevated blood-pressure reading, without diagnosis of hypertension: Secondary | ICD-10-CM | POA: Diagnosis not present

## 2022-08-07 DIAGNOSIS — R030 Elevated blood-pressure reading, without diagnosis of hypertension: Secondary | ICD-10-CM | POA: Diagnosis not present

## 2022-08-08 DIAGNOSIS — R030 Elevated blood-pressure reading, without diagnosis of hypertension: Secondary | ICD-10-CM | POA: Diagnosis not present

## 2022-08-09 DIAGNOSIS — R030 Elevated blood-pressure reading, without diagnosis of hypertension: Secondary | ICD-10-CM | POA: Diagnosis not present

## 2022-08-10 DIAGNOSIS — E119 Type 2 diabetes mellitus without complications: Secondary | ICD-10-CM | POA: Diagnosis not present

## 2022-08-10 DIAGNOSIS — F17200 Nicotine dependence, unspecified, uncomplicated: Secondary | ICD-10-CM | POA: Diagnosis not present

## 2022-08-10 DIAGNOSIS — E785 Hyperlipidemia, unspecified: Secondary | ICD-10-CM | POA: Diagnosis not present

## 2022-08-11 DIAGNOSIS — E785 Hyperlipidemia, unspecified: Secondary | ICD-10-CM | POA: Diagnosis not present

## 2022-08-11 DIAGNOSIS — E119 Type 2 diabetes mellitus without complications: Secondary | ICD-10-CM | POA: Diagnosis not present

## 2022-08-11 DIAGNOSIS — F17200 Nicotine dependence, unspecified, uncomplicated: Secondary | ICD-10-CM | POA: Diagnosis not present

## 2022-08-12 DIAGNOSIS — F17200 Nicotine dependence, unspecified, uncomplicated: Secondary | ICD-10-CM | POA: Diagnosis not present

## 2022-08-12 DIAGNOSIS — E119 Type 2 diabetes mellitus without complications: Secondary | ICD-10-CM | POA: Diagnosis not present

## 2022-08-12 DIAGNOSIS — Z79899 Other long term (current) drug therapy: Secondary | ICD-10-CM | POA: Diagnosis not present

## 2022-08-12 DIAGNOSIS — E785 Hyperlipidemia, unspecified: Secondary | ICD-10-CM | POA: Diagnosis not present

## 2022-08-14 DIAGNOSIS — R6889 Other general symptoms and signs: Secondary | ICD-10-CM | POA: Diagnosis not present

## 2022-08-14 DIAGNOSIS — Z1389 Encounter for screening for other disorder: Secondary | ICD-10-CM | POA: Diagnosis not present

## 2022-08-14 DIAGNOSIS — Z119 Encounter for screening for infectious and parasitic diseases, unspecified: Secondary | ICD-10-CM | POA: Diagnosis not present

## 2022-08-14 DIAGNOSIS — Z5181 Encounter for therapeutic drug level monitoring: Secondary | ICD-10-CM | POA: Diagnosis not present

## 2022-08-16 DIAGNOSIS — F17200 Nicotine dependence, unspecified, uncomplicated: Secondary | ICD-10-CM | POA: Diagnosis not present

## 2022-08-16 DIAGNOSIS — E119 Type 2 diabetes mellitus without complications: Secondary | ICD-10-CM | POA: Diagnosis not present

## 2022-08-16 DIAGNOSIS — E785 Hyperlipidemia, unspecified: Secondary | ICD-10-CM | POA: Diagnosis not present

## 2022-08-17 DIAGNOSIS — E119 Type 2 diabetes mellitus without complications: Secondary | ICD-10-CM | POA: Diagnosis not present

## 2022-08-17 DIAGNOSIS — F17200 Nicotine dependence, unspecified, uncomplicated: Secondary | ICD-10-CM | POA: Diagnosis not present

## 2022-08-17 DIAGNOSIS — E785 Hyperlipidemia, unspecified: Secondary | ICD-10-CM | POA: Diagnosis not present

## 2022-08-18 DIAGNOSIS — R030 Elevated blood-pressure reading, without diagnosis of hypertension: Secondary | ICD-10-CM | POA: Diagnosis not present

## 2022-08-18 DIAGNOSIS — Z0389 Encounter for observation for other suspected diseases and conditions ruled out: Secondary | ICD-10-CM | POA: Diagnosis not present

## 2022-08-18 DIAGNOSIS — R6889 Other general symptoms and signs: Secondary | ICD-10-CM | POA: Diagnosis not present

## 2022-08-18 DIAGNOSIS — Z5181 Encounter for therapeutic drug level monitoring: Secondary | ICD-10-CM | POA: Diagnosis not present

## 2022-08-19 DIAGNOSIS — E118 Type 2 diabetes mellitus with unspecified complications: Secondary | ICD-10-CM | POA: Diagnosis not present

## 2022-08-20 DIAGNOSIS — E118 Type 2 diabetes mellitus with unspecified complications: Secondary | ICD-10-CM | POA: Diagnosis not present

## 2022-08-30 ENCOUNTER — Encounter (HOSPITAL_COMMUNITY): Payer: Self-pay

## 2022-08-30 ENCOUNTER — Emergency Department (HOSPITAL_COMMUNITY)
Admission: EM | Admit: 2022-08-30 | Discharge: 2022-08-30 | Disposition: A | Discharge status: Home / Self Care | Payer: 59 | Attending: Emergency Medicine | Admitting: Emergency Medicine

## 2022-08-30 ENCOUNTER — Other Ambulatory Visit: Payer: Self-pay

## 2022-08-30 DIAGNOSIS — E119 Type 2 diabetes mellitus without complications: Secondary | ICD-10-CM | POA: Diagnosis not present

## 2022-08-30 DIAGNOSIS — Z5900 Homelessness unspecified: Secondary | ICD-10-CM | POA: Diagnosis not present

## 2022-08-30 DIAGNOSIS — M79671 Pain in right foot: Secondary | ICD-10-CM | POA: Insufficient documentation

## 2022-08-30 DIAGNOSIS — Z7984 Long term (current) use of oral hypoglycemic drugs: Secondary | ICD-10-CM | POA: Diagnosis not present

## 2022-08-30 DIAGNOSIS — M79672 Pain in left foot: Secondary | ICD-10-CM | POA: Diagnosis not present

## 2022-08-30 MED ORDER — ACETAMINOPHEN 500 MG PO TABS
1000.0000 mg | ORAL_TABLET | Freq: Once | ORAL | Status: AC
  Administered 2022-08-30: 1000 mg via ORAL
  Filled 2022-08-30: qty 2

## 2022-08-30 NOTE — ED Triage Notes (Signed)
Pt presents with bilateral foot pain that has been going on for weeks.

## 2022-08-30 NOTE — Discharge Instructions (Signed)
Thank you for allowing me to be a part of your care today.   You have evidence of blisters on your feet. I recommend wearing socks when you can while wearing your shoes to help prevent blisters.   I have provided a resource guide about shelters. I recommend utilizing these services.  Follow-up with your primary care doctor.

## 2022-08-30 NOTE — ED Provider Notes (Signed)
[facility] EMERGENCY DEPARTMENT AT [facility] Provider Note   CSN: [id number] Arrival date & time: 08/30/22  2025     History  Chief Complaint  Patient presents with   Foot Pain    [name] is a 47 y.o. male with past medical history significant for paranoid schizophrenia, bipolar disorder, hyperlipidemia, diabetes presents to the ED complaining of bilateral foot pain that has been ongoing for weeks.  He states that he has not had a chance to make it to the ED to be evaluated for it.  Patient is homeless and spends a lot of time walking around outside.  He is unable to give specifics about his foot pain.  He ambulated in the ED without difficulty.         Home Medications Prior to Admission medications   Medication Sig Start Date End Date Taking? Authorizing Provider  atorvastatin (LIPITOR) 40 MG tablet Take 1 tablet (40 mg total) by mouth at bedtime. 06/03/22 07/03/22  Princess Bruins, DO  benztropine (COGENTIN) 2 MG tablet Take 1 tablet (2 mg total) by mouth every 12 (twelve) hours. 06/03/22 07/03/22  Princess Bruins, DO  clotrimazole (LOTRIMIN) 1 % cream Apply to affected area 2 times daily 07/27/22   [name], MD  divalproex (DEPAKOTE) 500 MG DR tablet Take 1 tablet (500 mg total) by mouth every 12 (twelve) hours. 06/03/22 07/03/22  Princess Bruins, DO  empagliflozin (JARDIANCE) 25 MG TABS tablet Take 1 tablet (25 mg total) by mouth daily before breakfast. 06/03/22   Princess Bruins, DO  haloperidol (HALDOL) 10 MG tablet Take 1 tablet (10 mg total) by mouth every 12 (twelve) hours. 06/03/22 07/03/22  Princess Bruins, DO  INVEGA SUSTENNA 234 MG/1.5ML injection Inject into the muscle.    [provider]  metFORMIN (GLUCOPHAGE) 1000 MG tablet Take 1 tablet (1,000 mg total) by mouth 2 (two) times daily with a meal. 06/03/22 07/03/22  Princess Bruins, DO  nicotine polacrilex (NICORETTE) 2 MG gum Take 1 each (2 mg total) by mouth as needed for smoking cessation. 06/03/22   Princess Bruins, DO  traZODone (DESYREL) 150 MG tablet Take 1 tablet (150 mg total) by mouth at bedtime as needed for sleep. 06/17/22   [name], [name], MD      Allergies    Other    Review of Systems   Review of Systems  Musculoskeletal:  Positive for arthralgias (bilateral foot pain x weeks). Negative for gait problem.  Neurological:  Negative for weakness.    Physical Exam Updated Vital Signs BP (!) 140/97   Pulse 93   Temp 99.1 F (37.3 C) (Oral)   Resp 18   SpO2 98%  Physical Exam Vitals and nursing note reviewed.  Constitutional:      General: He is not in acute distress.    Appearance: Normal appearance. He is not ill-appearing or diaphoretic.  Cardiovascular:     Rate and Rhythm: Normal rate and regular rhythm.  Pulmonary:     Effort: Pulmonary effort is normal.  Musculoskeletal:     Right foot: Normal range of motion. No swelling, deformity, tenderness or bony tenderness. Normal pulse.     Left foot: Normal range of motion. No swelling, deformity, tenderness or bony tenderness. Normal pulse.     Comments: Small blisters to a few toes bilaterally without significant skin breakdown.  No bleeding.  No cellulitis or evidence of infection.  He has normal ROM of both feet and is able to bear weight.  Neurological:     Mental Status: He is alert. Mental status is at baseline.  Psychiatric:        Mood and Affect: Mood normal.        Behavior: Behavior normal.     ED Results / Procedures / Treatments   Labs (all labs ordered are listed, but only abnormal results are displayed) Labs Reviewed - No data to display  EKG None  Radiology No results found.  Procedures Procedures    Medications Ordered in ED Medications  acetaminophen (TYLENOL) tablet 1,000 mg (has no administration in time range)    ED Course/ Medical Decision Making/ A&P                             Medical Decision Making Risk OTC drugs.   Patient presents to the ED complaining of bilateral  foot pain for the past few weeks.  Patient is vague and does not give detail about what is bothering him specifically about his feet.  Patient is homeless and spends a lot of time outside and walking around.   Exam significant for small blisters bilaterally to several toes without significant skin breakdown.  No active bleeding or fluid draining.  No cellulitis or evidence of infection.  He is able to move his feet and ankles without difficulty and has normal sensation.    Patient is vague about his symptoms and does not have any significant findings on physical exam.  Symptoms may be related to extended walking, blisters, or possible neuropathy given patient's history of diabetes.  As this has been ongoing for weeks and is not related to injury, I do not feel that imaging is warranted at this time.   Patient has not had a place to stay and has not eaten since this morning.  Will give him socks, food and drink, as well as some Tylenol for his foot pain.  I feel that patient is appropriate for discharge.  Will provide resources for shelters.   The patient has been appropriately medically screened and/or stabilized in the ED. I have low suspicion for any other emergent medical condition which would require further screening, evaluation or treatment in the ED or require inpatient management. At time of discharge the patient is hemodynamically stable and in no acute distress. We discussed strict return precautions for returning to the emergency department and they verbalized understanding.           Final Clinical Impression(s) / ED Diagnoses Final diagnoses:  Foot pain, bilateral    Rx / DC Orders ED Discharge Orders     None         Lenard Simmer, PA-C 08/30/22 2242    Bethann Berkshire, MD 08/31/22 1314

## 2022-09-04 ENCOUNTER — Other Ambulatory Visit: Payer: Self-pay

## 2022-09-04 ENCOUNTER — Encounter (HOSPITAL_COMMUNITY): Payer: Self-pay | Admitting: Emergency Medicine

## 2022-09-04 ENCOUNTER — Emergency Department (HOSPITAL_COMMUNITY)
Admission: EM | Admit: 2022-09-04 | Discharge: 2022-09-04 | Disposition: A | Discharge status: Home / Self Care | Payer: 59 | Attending: Emergency Medicine | Admitting: Emergency Medicine

## 2022-09-04 DIAGNOSIS — E119 Type 2 diabetes mellitus without complications: Secondary | ICD-10-CM | POA: Insufficient documentation

## 2022-09-04 DIAGNOSIS — Z7984 Long term (current) use of oral hypoglycemic drugs: Secondary | ICD-10-CM | POA: Diagnosis not present

## 2022-09-04 DIAGNOSIS — M79605 Pain in left leg: Secondary | ICD-10-CM | POA: Diagnosis not present

## 2022-09-04 LAB — CBG MONITORING, ED: Glucose-Capillary: 115 mg/dL — ABNORMAL HIGH (ref 70–99)

## 2022-09-04 MED ORDER — IBUPROFEN 400 MG PO TABS
600.0000 mg | ORAL_TABLET | Freq: Once | ORAL | Status: AC
  Administered 2022-09-04: 600 mg via ORAL
  Filled 2022-09-04: qty 2

## 2022-09-04 NOTE — ED Triage Notes (Signed)
Pt c/o left shin pain. No injury. States been walking a lot. Nad at this time. Pt denies avh. States has been compliant with pscyh meds.

## 2022-09-04 NOTE — ED Provider Notes (Signed)
[facility] EMERGENCY DEPARTMENT AT [facility] Provider Note   CSN: [id number] Arrival date & time: 09/04/22  1744     History  No chief complaint on file.  HPI [name] is a 47 y.o. male with paranoid schizophrenia, bipolar disorder and diabetes presenting for left leg pain.  Started 2 weeks ago.  Pain is located just below the left knee about the upper shin.  Denies any trauma to the region.  States it is worse with walking.  Denies calf tenderness.  Denies shortness of breath or chest pain.  Has not take anything for his pain.  States he is homeless at this time and does do a lot of walking.  Also states he has been working out a good bit more as well.  States he is still able to bear weight and ambulate without complication.  HPI     Home Medications Prior to Admission medications   Medication Sig Start Date End Date Taking? Authorizing Provider  atorvastatin (LIPITOR) 40 MG tablet Take 1 tablet (40 mg total) by mouth at bedtime. 06/03/22 07/03/22  Princess Bruins, DO  benztropine (COGENTIN) 2 MG tablet Take 1 tablet (2 mg total) by mouth every 12 (twelve) hours. 06/03/22 07/03/22  Princess Bruins, DO  clotrimazole (LOTRIMIN) 1 % cream Apply to affected area 2 times daily 07/27/22   [name], MD  divalproex (DEPAKOTE) 500 MG DR tablet Take 1 tablet (500 mg total) by mouth every 12 (twelve) hours. 06/03/22 07/03/22  Princess Bruins, DO  empagliflozin (JARDIANCE) 25 MG TABS tablet Take 1 tablet (25 mg total) by mouth daily before breakfast. 06/03/22   Princess Bruins, DO  haloperidol (HALDOL) 10 MG tablet Take 1 tablet (10 mg total) by mouth every 12 (twelve) hours. 06/03/22 07/03/22  Princess Bruins, DO  INVEGA SUSTENNA 234 MG/1.5ML injection Inject into the muscle.    [provider]  metFORMIN (GLUCOPHAGE) 1000 MG tablet Take 1 tablet (1,000 mg total) by mouth 2 (two) times daily with a meal. 06/03/22 07/03/22  Princess Bruins, DO  nicotine polacrilex (NICORETTE) 2 MG gum Take 1  each (2 mg total) by mouth as needed for smoking cessation. 06/03/22   Princess Bruins, DO  traZODone (DESYREL) 150 MG tablet Take 1 tablet (150 mg total) by mouth at bedtime as needed for sleep. 06/17/22   [name], [name], MD      Allergies    Other    Review of Systems   See HPI for pertinent positives  Physical Exam Updated Vital Signs BP (!) 132/92 (BP Location: Right Arm)   Pulse (!) 113   Temp 98.9 F (37.2 C) (Oral)   Resp 19   SpO2 97%  Physical Exam Musculoskeletal:     Left knee: No swelling, deformity, effusion or erythema. Normal range of motion.       Legs:     ED Results / Procedures / Treatments   Labs (all labs ordered are listed, but only abnormal results are displayed) Labs Reviewed  CBG MONITORING, ED - Abnormal; Notable for the following components:      Result Value   Glucose-Capillary 115 (*)    All other components within normal limits    EKG None  Radiology No results found.  Procedures Procedures    Medications Ordered in ED Medications  ibuprofen (ADVIL) tablet 600 mg (has no administration in time range)    ED Course/ Medical Decision Making/ A&P  Medical Decision Making  47 year old well-appearing male presenting for left leg pain.  Exam is unremarkable.  DDx includes fracture, dislocation, DVT, knee effusion, and cellulitis.  Overall patient appears clinically well there were no observable findings on exam where patient endorses the pain.  Treated his pain with ibuprofen.  Patient stated he is homeless and hungry.  Provided sandwich and soda.  Advised to follow-up with his PCP for his leg pain.  Symptoms not consistent with DVT.  Unlikely that he is has a fracture or dislocation in that area given no recent trauma, no obvious deformity, swelling, abrasion or tenderness.  Vital stable at discharge.        Final Clinical Impression(s) / ED Diagnoses Final diagnoses:  Pain of left lower extremity     Rx / DC Orders ED Discharge Orders     None         Gareth Eagle, PA-C 09/04/22 Sydnee Cabal, MD 09/04/22 2140

## 2022-09-04 NOTE — ED Notes (Signed)
Mother [name] was called and notified her son was here and the reason for his visit. She verbalized understanding and also stated patient's dad has been looking for him. Adv mother patient is being discharged and will wait on lobby. Mother states patient's dad is on the way.

## 2022-09-04 NOTE — Discharge Instructions (Addendum)
Evaluation for leg pain is overall reassuring.  Recommend you follow-up with your PCP for your leg pain.  Unlikely that you have a fracture or dislocation since she had no recent trauma to your leg.  Recommend Tylenol and ibuprofen for pain.  You can also use ice and apply directly to the area of pain and swelling.  I would do this for the next 48 hours 3-4 times a day for 15 minutes.

## 2022-09-16 ENCOUNTER — Other Ambulatory Visit: Payer: Self-pay

## 2022-09-16 ENCOUNTER — Encounter (HOSPITAL_COMMUNITY): Payer: Self-pay

## 2022-09-16 ENCOUNTER — Emergency Department (HOSPITAL_COMMUNITY)
Admission: EM | Admit: 2022-09-16 | Discharge: 2022-09-17 | Disposition: A | Discharge status: Home / Self Care | Payer: 59 | Attending: Emergency Medicine | Admitting: Emergency Medicine

## 2022-09-16 DIAGNOSIS — Z01812 Encounter for preprocedural laboratory examination: Secondary | ICD-10-CM | POA: Diagnosis not present

## 2022-09-16 DIAGNOSIS — Z7984 Long term (current) use of oral hypoglycemic drugs: Secondary | ICD-10-CM | POA: Insufficient documentation

## 2022-09-16 DIAGNOSIS — Z0189 Encounter for other specified special examinations: Secondary | ICD-10-CM

## 2022-09-16 NOTE — ED Triage Notes (Addendum)
Ambulatory from home. Cc of wanting bloodwork done incase he gets married. Isnt getting married anytime soon and hasn't had sex with anyone.   Wants the bloodwork done because he thinks something jumped on him and has insurance.

## 2022-09-17 LAB — CBG MONITORING, ED: Glucose-Capillary: 96 mg/dL (ref 70–99)

## 2022-09-17 NOTE — ED Provider Notes (Signed)
[place] EMERGENCY DEPARTMENT AT [facility] Provider Note   CSN: [id number] Arrival date & time: 09/16/22  1954     History  Chief Complaint  Patient presents with   Labs Only    [name] is a 47 y.o. male.  Presents stating that he wants his blood work checked.  He says that he wants on file just in case he gets married.  He is also concerned about not getting enough greens in his diet.  He also reports that he wants more [employer].       Home Medications Prior to Admission medications   Medication Sig Start Date End Date Taking? Authorizing Provider  atorvastatin (LIPITOR) 40 MG tablet Take 1 tablet (40 mg total) by mouth at bedtime. 06/03/22 07/03/22  Princess Bruins, DO  benztropine (COGENTIN) 2 MG tablet Take 1 tablet (2 mg total) by mouth every 12 (twelve) hours. 06/03/22 07/03/22  Princess Bruins, DO  clotrimazole (LOTRIMIN) 1 % cream Apply to affected area 2 times daily 07/27/22   [name], MD  divalproex (DEPAKOTE) 500 MG DR tablet Take 1 tablet (500 mg total) by mouth every 12 (twelve) hours. 06/03/22 07/03/22  Princess Bruins, DO  empagliflozin (JARDIANCE) 25 MG TABS tablet Take 1 tablet (25 mg total) by mouth daily before breakfast. 06/03/22   Princess Bruins, DO  haloperidol (HALDOL) 10 MG tablet Take 1 tablet (10 mg total) by mouth every 12 (twelve) hours. 06/03/22 07/03/22  Princess Bruins, DO  INVEGA SUSTENNA 234 MG/1.5ML injection Inject into the muscle.    [provider]  metFORMIN (GLUCOPHAGE) 1000 MG tablet Take 1 tablet (1,000 mg total) by mouth 2 (two) times daily with a meal. 06/03/22 07/03/22  Princess Bruins, DO  nicotine polacrilex (NICORETTE) 2 MG gum Take 1 each (2 mg total) by mouth as needed for smoking cessation. 06/03/22   Princess Bruins, DO  traZODone (DESYREL) 150 MG tablet Take 1 tablet (150 mg total) by mouth at bedtime as needed for sleep. 06/17/22   [name], [name], MD      Allergies    Other    Review of Systems   Review of  Systems  Physical Exam Updated Vital Signs BP 118/79 (BP Location: Right Arm)   Pulse 80   Temp 98 F (36.7 C) (Oral)   Resp 16   Ht 5\' 11"  (1.803 m)   Wt 111 kg   SpO2 99%   BMI 34.13 kg/m  Physical Exam Vitals and nursing note reviewed.  Constitutional:      General: He is not in acute distress.    Appearance: He is well-developed.  HENT:     Head: Normocephalic and atraumatic.     Mouth/Throat:     Mouth: Mucous membranes are moist.  Eyes:     General: Vision grossly intact. Gaze aligned appropriately.     Extraocular Movements: Extraocular movements intact.     Conjunctiva/sclera: Conjunctivae normal.  Cardiovascular:     Rate and Rhythm: Normal rate and regular rhythm.     Pulses: Normal pulses.     Heart sounds: Normal heart sounds, S1 normal and S2 normal. No murmur heard.    No friction rub. No gallop.  Pulmonary:     Effort: Pulmonary effort is normal. No respiratory distress.     Breath sounds: Normal breath sounds.  Abdominal:     Palpations: Abdomen is soft.     Tenderness: There is no abdominal tenderness. There is no guarding or rebound.  Hernia: No hernia is present.  Musculoskeletal:        General: No swelling.     Cervical back: Full passive range of motion without pain, normal range of motion and neck supple. No pain with movement, spinous process tenderness or muscular tenderness. Normal range of motion.     Right lower leg: No edema.     Left lower leg: No edema.  Skin:    General: Skin is warm and dry.     Capillary Refill: Capillary refill takes less than 2 seconds.     Findings: No ecchymosis, erythema, lesion or wound.  Neurological:     Mental Status: He is alert and oriented to person, place, and time.     GCS: GCS eye subscore is 4. GCS verbal subscore is 5. GCS motor subscore is 6.     Cranial Nerves: Cranial nerves 2-12 are intact.     Sensory: Sensation is intact.     Motor: Motor function is intact. No weakness or abnormal muscle  tone.     Coordination: Coordination is intact.  Psychiatric:        Mood and Affect: Mood normal.        Speech: Speech is tangential.        Behavior: Behavior normal.     ED Results / Procedures / Treatments   Labs (all labs ordered are listed, but only abnormal results are displayed) Labs Reviewed  CBG MONITORING, ED    EKG None  Radiology No results found.  Procedures Procedures    Medications Ordered in ED Medications - No data to display  ED Course/ Medical Decision Making/ A&P                             Medical Decision Making  Patient with history of schizophrenia presents stating that he wants blood work done.  Reviewing his records he has been here in the past with concerns over HIV that are not warranted.  Patient is slightly tangential but he does not appear to be a harm to himself or others.  He does have history of diabetes, fingerstick glucose performed.        Final Clinical Impression(s) / ED Diagnoses Final diagnoses:  Visit for blood test    Rx / DC Orders ED Discharge Orders     None         [name], MD 09/17/22 [phone]

## 2022-09-19 ENCOUNTER — Emergency Department (HOSPITAL_COMMUNITY)
Admission: EM | Admit: 2022-09-19 | Discharge: 2022-09-19 | Disposition: A | Discharge status: Home / Self Care | Payer: 59 | Attending: Emergency Medicine | Admitting: Emergency Medicine

## 2022-09-19 ENCOUNTER — Other Ambulatory Visit: Payer: Self-pay

## 2022-09-19 ENCOUNTER — Encounter (HOSPITAL_COMMUNITY): Payer: Self-pay | Admitting: Emergency Medicine

## 2022-09-19 DIAGNOSIS — E119 Type 2 diabetes mellitus without complications: Secondary | ICD-10-CM | POA: Insufficient documentation

## 2022-09-19 DIAGNOSIS — M79604 Pain in right leg: Secondary | ICD-10-CM | POA: Diagnosis not present

## 2022-09-19 DIAGNOSIS — Z7984 Long term (current) use of oral hypoglycemic drugs: Secondary | ICD-10-CM | POA: Insufficient documentation

## 2022-09-19 LAB — CBC WITH DIFFERENTIAL/PLATELET
Abs Immature Granulocytes: 0.05 10*3/uL (ref 0.00–0.07)
Basophils Absolute: 0 10*3/uL (ref 0.0–0.1)
Basophils Relative: 0 %
Eosinophils Absolute: 0 10*3/uL (ref 0.0–0.5)
Eosinophils Relative: 0 %
HCT: 42.6 % (ref 39.0–52.0)
Hemoglobin: 14.6 g/dL (ref 13.0–17.0)
Immature Granulocytes: 1 %
Lymphocytes Relative: 23 %
Lymphs Abs: 2.3 10*3/uL (ref 0.7–4.0)
MCH: 29.8 pg (ref 26.0–34.0)
MCHC: 34.3 g/dL (ref 30.0–36.0)
MCV: 86.9 fL (ref 80.0–100.0)
Monocytes Absolute: 0.4 10*3/uL (ref 0.1–1.0)
Monocytes Relative: 4 %
Neutro Abs: 7.2 10*3/uL (ref 1.7–7.7)
Neutrophils Relative %: 72 %
Platelets: 337 10*3/uL (ref 150–400)
RBC: 4.9 MIL/uL (ref 4.22–5.81)
RDW: 12.3 % (ref 11.5–15.5)
WBC: 10 10*3/uL (ref 4.0–10.5)
nRBC: 0 % (ref 0.0–0.2)

## 2022-09-19 LAB — MAGNESIUM: Magnesium: 2 mg/dL (ref 1.7–2.4)

## 2022-09-19 LAB — BASIC METABOLIC PANEL
Anion gap: 12 (ref 5–15)
BUN: 17 mg/dL (ref 6–20)
CO2: 25 mmol/L (ref 22–32)
Calcium: 9.2 mg/dL (ref 8.9–10.3)
Chloride: 99 mmol/L (ref 98–111)
Creatinine, Ser: 1.09 mg/dL (ref 0.61–1.24)
GFR, Estimated: >60 mL/min (ref >60)
Glucose, Bld: 89 mg/dL (ref 70–99)
Potassium: 3.6 mmol/L (ref 3.5–5.1)
Sodium: 136 mmol/L (ref 135–145)

## 2022-09-19 MED ORDER — IBUPROFEN 400 MG PO TABS
600.0000 mg | ORAL_TABLET | Freq: Once | ORAL | Status: AC
  Administered 2022-09-19: 600 mg via ORAL
  Filled 2022-09-19: qty 2

## 2022-09-19 MED ORDER — LIDOCAINE 5 % EX PTCH: 1.0000 | MEDICATED_PATCH | CUTANEOUS | 0 refills | Status: AC

## 2022-09-19 MED ORDER — LIDOCAINE 5 % EX PTCH
1.0000 | MEDICATED_PATCH | CUTANEOUS | Status: DC
  Administered 2022-09-19: 1 via TRANSDERMAL
  Filled 2022-09-19: qty 1

## 2022-09-19 MED ORDER — NAPROXEN 500 MG PO TABS: 500.0000 mg | ORAL_TABLET | Freq: Two times a day (BID) | ORAL | 0 refills | Status: DC

## 2022-09-19 NOTE — ED Provider Notes (Signed)
[place] EMERGENCY DEPARTMENT AT [facility] Provider Note   CSN: [id number] Arrival date & time: 09/19/22  4098     History  Chief Complaint  Patient presents with   Leg Pain    [name] is a 47 y.o. male.  He is a 47 year old male with history of diabetes, high cholesterol and schizophrenia.  Presents the ER today complaining of right lateral thigh cramping that he started this morning.  He states it is painful.  He is able to walk, no injuries.  Never had this in the past.  No fevers or chills, no swelling.  No other complaints.  He also states that "I had to get out of there because there is no food".   Leg Pain      Home Medications Prior to Admission medications   Medication Sig Start Date End Date Taking? Authorizing Provider  lidocaine (LIDODERM) 5 % Place 1 patch onto the skin daily. Remove & Discard patch within 12 hours or as directed by MD 09/19/22  Yes Cristi Loron, Vanya Carberry A, PA-C  naproxen (NAPROSYN) 500 MG tablet Take 1 tablet (500 mg total) by mouth 2 (two) times daily. 09/19/22  Yes Weronika Birch A, PA-C  atorvastatin (LIPITOR) 40 MG tablet Take 1 tablet (40 mg total) by mouth at bedtime. 06/03/22 07/03/22  Princess Bruins, DO  benztropine (COGENTIN) 2 MG tablet Take 1 tablet (2 mg total) by mouth every 12 (twelve) hours. 06/03/22 07/03/22  Princess Bruins, DO  clotrimazole (LOTRIMIN) 1 % cream Apply to affected area 2 times daily 07/27/22   [name], MD  divalproex (DEPAKOTE) 500 MG DR tablet Take 1 tablet (500 mg total) by mouth every 12 (twelve) hours. 06/03/22 07/03/22  Princess Bruins, DO  empagliflozin (JARDIANCE) 25 MG TABS tablet Take 1 tablet (25 mg total) by mouth daily before breakfast. 06/03/22   Princess Bruins, DO  haloperidol (HALDOL) 10 MG tablet Take 1 tablet (10 mg total) by mouth every 12 (twelve) hours. 06/03/22 07/03/22  Princess Bruins, DO  INVEGA SUSTENNA 234 MG/1.5ML injection Inject into the muscle.    [provider]  metFORMIN  (GLUCOPHAGE) 1000 MG tablet Take 1 tablet (1,000 mg total) by mouth 2 (two) times daily with a meal. 06/03/22 07/03/22  Princess Bruins, DO  nicotine polacrilex (NICORETTE) 2 MG gum Take 1 each (2 mg total) by mouth as needed for smoking cessation. 06/03/22   Princess Bruins, DO  traZODone (DESYREL) 150 MG tablet Take 1 tablet (150 mg total) by mouth at bedtime as needed for sleep. 06/17/22   [name], [name], MD      Allergies    Other    Review of Systems   Review of Systems  Physical Exam Updated Vital Signs BP (!) 125/91 (BP Location: Left Arm)   Pulse 93   Temp 98.2 F (36.8 C)   Resp 18   Ht 5\' 11"  (1.803 m)   Wt 111 kg   SpO2 100%   BMI 34.13 kg/m  Physical Exam Vitals and nursing note reviewed.  Constitutional:      General: He is not in acute distress.    Appearance: He is well-developed.  HENT:     Head: Normocephalic and atraumatic.     Mouth/Throat:     Mouth: Mucous membranes are moist.  Eyes:     Conjunctiva/sclera: Conjunctivae normal.  Cardiovascular:     Rate and Rhythm: Normal rate and regular rhythm.     Heart sounds: No murmur heard. Pulmonary:  Effort: Pulmonary effort is normal. No respiratory distress.     Breath sounds: Normal breath sounds.  Abdominal:     Palpations: Abdomen is soft.     Tenderness: There is no abdominal tenderness.  Musculoskeletal:        General: No swelling.     Cervical back: Neck supple.     Comments: Mild tenderness right lateral quadriceps.  No redness or swelling.  Patient can bear weight and ambulate.  DP and PT pulses 2+ right foot.  Normal range of motion right knee and hip  Skin:    General: Skin is warm and dry.     Capillary Refill: Capillary refill takes less than 2 seconds.  Neurological:     General: No focal deficit present.     Mental Status: He is alert and oriented to person, place, and time.     Motor: No weakness.  Psychiatric:        Mood and Affect: Mood normal.     ED Results / Procedures /  Treatments   Labs (all labs ordered are listed, but only abnormal results are displayed) Labs Reviewed  BASIC METABOLIC PANEL  CBC WITH DIFFERENTIAL/PLATELET  MAGNESIUM    EKG None  Radiology No results found.  Procedures Procedures    Medications Ordered in ED Medications  lidocaine (LIDODERM) 5 % 1 patch (1 patch Transdermal Patch Applied 09/19/22 1004)  ibuprofen (ADVIL) tablet 600 mg (600 mg Oral Given 09/19/22 1005)    ED Course/ Medical Decision Making/ A&P                             Medical Decision Making Differential diagnosis: Muscle spasm, strain, electrolyte abnormality, contusion, other ED course: Patient was complaining of muscle spasms in the right leg.  There is no palpable spasm on exam but he had some mild lateral quadriceps tenderness.  He is given lidocaine patch and ibuprofen for pain, labs were ordered to rule out electrolyte abnormality causing him to have spasms.  Labs are normal.  Amount and/or Complexity of Data Reviewed External Data Reviewed: notes. Labs: ordered.    Details: Normal CBC CMP and magnesium           Final Clinical Impression(s) / ED Diagnoses Final diagnoses:  Right leg pain    Rx / DC Orders ED Discharge Orders          Ordered    lidocaine (LIDODERM) 5 %  Every 24 hours        09/19/22 1050    naproxen (NAPROSYN) 500 MG tablet  2 times daily        09/19/22 [address] 09/19/22 1053    Bethann Berkshire, MD 09/19/22 1704

## 2022-09-19 NOTE — ED Triage Notes (Signed)
Pt c/o R leg cramps that started this morning, pt was able to ambulate back to room from triage without assistance, but did appear to have a slower gait and needed to stop during the walk back.

## 2022-09-19 NOTE — Discharge Instructions (Signed)
Today for leg pain and muscle cramp.  Your blood work was reassuring.  We are glad you are feeling better.  Follow-up closely with the primary care doctor and come back to the ER if you have new or worsening symptoms.

## 2022-09-26 ENCOUNTER — Other Ambulatory Visit: Payer: Self-pay

## 2022-09-26 ENCOUNTER — Encounter (HOSPITAL_COMMUNITY): Payer: Self-pay | Admitting: *Deleted

## 2022-09-26 ENCOUNTER — Emergency Department (HOSPITAL_COMMUNITY)
Admission: EM | Admit: 2022-09-26 | Discharge: 2022-09-26 | Disposition: A | Discharge status: Home / Self Care | Payer: 59 | Attending: Emergency Medicine | Admitting: Emergency Medicine

## 2022-09-26 DIAGNOSIS — R21 Rash and other nonspecific skin eruption: Secondary | ICD-10-CM | POA: Diagnosis not present

## 2022-09-26 DIAGNOSIS — Z0000 Encounter for general adult medical examination without abnormal findings: Secondary | ICD-10-CM | POA: Diagnosis not present

## 2022-09-26 DIAGNOSIS — Z5329 Procedure and treatment not carried out because of patient's decision for other reasons: Secondary | ICD-10-CM | POA: Insufficient documentation

## 2022-09-26 DIAGNOSIS — E119 Type 2 diabetes mellitus without complications: Secondary | ICD-10-CM | POA: Diagnosis not present

## 2022-09-26 DIAGNOSIS — Z5900 Homelessness unspecified: Secondary | ICD-10-CM | POA: Insufficient documentation

## 2022-09-26 MED ORDER — TRIAMCINOLONE ACETONIDE 0.1 % EX CREA: 1.0000 | TOPICAL_CREAM | Freq: Two times a day (BID) | CUTANEOUS | 0 refills | Status: AC

## 2022-09-26 NOTE — Discharge Instructions (Signed)
You have been seen today for your complaint of rash. Your discharge medications include kenalog. This is a steroid cream. Apply twice daily to the rash until the rash has been gone for 3 days. Home care instructions are as follows:  Stop taking the medication that you believe is causing your rash Follow up with: your primary care provider in one week. Please seek immediate medical care if you develop any of the following symptoms: You start to feel mixed up (confused). You have a very bad headache or a stiff neck. You have very bad joint pain or stiffness. You get very sleepy or not responsive. You have a seizure. At this time there does not appear to be the presence of an emergent medical condition, however there is always the potential for conditions to change. Please read and follow the below instructions.  Do not take your medicine if  develop an itchy rash, swelling in your mouth or lips, or difficulty breathing; call 911 and seek immediate emergency medical attention if this occurs.  You may review your lab tests and imaging results in their entirety on your MyChart account.  Please discuss all results of fully with your primary care provider and other specialist at your follow-up visit.  Note: Portions of this text may have been transcribed using voice recognition software. Every effort was made to ensure accuracy; however, inadvertent computerized transcription errors may still be present.

## 2022-09-26 NOTE — ED Triage Notes (Signed)
Pt states he woke up with rash to bilateral arms, denies any itching.

## 2022-09-26 NOTE — ED Provider Notes (Signed)
[place] EMERGENCY DEPARTMENT AT [facility] Provider Note   CSN: [id number] Arrival date & time: 09/26/22  1301     History  Chief Complaint  Patient presents with   Rash    [name] is a 47 y.o. male.  With history of paranoid schizophrenia, bipolar disorder, diabetes who presents to the ED for evaluation of a rash.  States he noticed the rash this morning.  Localized to bilateral upper extremities, laterally to the antecubital fossas.  Believes this is secondary to a medication reaction.  He states he has been taking a medication that he has had a similar reaction to for the past 2 weeks.  States he has been taking this because his father required in order for him to live at his father's house.  He also states that it may be a heat rash as he is "addicted to working out."  He denies pain, itching, fevers, chills, shortness of breath, throat swelling, difficulty swallowing.  No new soaps or detergents or beauty products.  No known environmental exposure.   Rash      Home Medications Prior to Admission medications   Medication Sig Start Date End Date Taking? Authorizing Provider  triamcinolone cream (KENALOG) 0.1 % Apply 1 Application topically 2 (two) times daily. 09/26/22  Yes Huber Mathers, Edsel Petrin, PA-C  atorvastatin (LIPITOR) 40 MG tablet Take 1 tablet (40 mg total) by mouth at bedtime. 06/03/22 07/03/22  Princess Bruins, DO  benztropine (COGENTIN) 2 MG tablet Take 1 tablet (2 mg total) by mouth every 12 (twelve) hours. 06/03/22 07/03/22  Princess Bruins, DO  clotrimazole (LOTRIMIN) 1 % cream Apply to affected area 2 times daily 07/27/22   [name], MD  divalproex (DEPAKOTE) 500 MG DR tablet Take 1 tablet (500 mg total) by mouth every 12 (twelve) hours. 06/03/22 07/03/22  Princess Bruins, DO  empagliflozin (JARDIANCE) 25 MG TABS tablet Take 1 tablet (25 mg total) by mouth daily before breakfast. 06/03/22   Princess Bruins, DO  haloperidol (HALDOL) 10 MG tablet Take 1 tablet  (10 mg total) by mouth every 12 (twelve) hours. 06/03/22 07/03/22  Princess Bruins, DO  INVEGA SUSTENNA 234 MG/1.5ML injection Inject into the muscle.    [provider]  lidocaine (LIDODERM) 5 % Place 1 patch onto the skin daily. Remove & Discard patch within 12 hours or as directed by MD 09/19/22   Carmel Sacramento A, PA-C  metFORMIN (GLUCOPHAGE) 1000 MG tablet Take 1 tablet (1,000 mg total) by mouth 2 (two) times daily with a meal. 06/03/22 07/03/22  Princess Bruins, DO  naproxen (NAPROSYN) 500 MG tablet Take 1 tablet (500 mg total) by mouth 2 (two) times daily. 09/19/22   Carmel Sacramento A, PA-C  nicotine polacrilex (NICORETTE) 2 MG gum Take 1 each (2 mg total) by mouth as needed for smoking cessation. 06/03/22   Princess Bruins, DO  traZODone (DESYREL) 150 MG tablet Take 1 tablet (150 mg total) by mouth at bedtime as needed for sleep. 06/17/22   [name], [name], MD      Allergies    Other    Review of Systems   Review of Systems  Skin:  Positive for rash.  All other systems reviewed and are negative.   Physical Exam Updated Vital Signs BP 122/83   Pulse 98   Temp 99.2 F (37.3 C) (Oral)   Resp 18   Ht 5\' 11"  (1.803 m)   SpO2 99%   BMI 34.13 kg/m  Physical Exam Vitals  and nursing note reviewed.  Constitutional:      General: He is not in acute distress.    Appearance: He is well-developed.  HENT:     Head: Normocephalic and atraumatic.     Mouth/Throat:     Mouth: Mucous membranes are moist.     Pharynx: Oropharynx is clear.     Comments: No intraoral swelling Eyes:     Conjunctiva/sclera: Conjunctivae normal.  Cardiovascular:     Rate and Rhythm: Normal rate and regular rhythm.     Heart sounds: No murmur heard. Pulmonary:     Effort: Pulmonary effort is normal. No respiratory distress.     Breath sounds: Normal breath sounds. No wheezing, rhonchi or rales.  Abdominal:     Palpations: Abdomen is soft.     Tenderness: There is no abdominal tenderness.  Musculoskeletal:         General: No swelling.     Cervical back: Neck supple.  Skin:    General: Skin is warm and dry.     Capillary Refill: Capillary refill takes less than 2 seconds.     Comments: Small area of a papular rash to bilateral upper extremities, lateral to the antecubital fossa.  No surrounding erythema.  No warmth.  No vesicles.  Neurological:     General: No focal deficit present.     Mental Status: He is alert and oriented to person, place, and time.  Psychiatric:        Mood and Affect: Mood normal.     ED Results / Procedures / Treatments   Labs (all labs ordered are listed, but only abnormal results are displayed) Labs Reviewed - No data to display  EKG None  Radiology No results found.  Procedures Procedures    Medications Ordered in ED Medications - No data to display  ED Course/ Medical Decision Making/ A&P                             Medical Decision Making This patient presents to the ED for concern of rash, this involves an extensive number of treatment options, and is a complaint that carries with it a high risk of complications and morbidity.  The differential diagnosis includes heat rash, medication reaction, contact dermatitis  Additional history obtained from: Nursing notes from this visit.  Afebrile, hemodynamically stable.  47 year old male presents to the ED for evaluation of rash.  On exam, he has small areas of papular rashes measuring approximately 3 cm x 3 cm to bilateral upper extremities.  No erythema.  No pain itching.  No known contacts or exposures to irritants.  States he is using any medication that he has had a similar reaction to, however has been using this for 2 weeks.  Unclear etiology of the rash, however appears very mild in nature.  Sent a prescription for steroid and encouraged him to follow-up.  States he should be able to follow-up with his PCP within the next week.  He was also encouraged to discontinue taking the medication.  Secondary  to trauma cautions including difficulty swallowing, breathing, facial swelling.  Stable at discharge.  At this time there does not appear to be any evidence of an acute emergency medical condition and the patient appears stable for discharge with appropriate outpatient follow up. Diagnosis was discussed with patient who verbalizes understanding of care plan and is agreeable to discharge. I have discussed return precautions with patient who verbalizes understanding.  Patient encouraged to follow-up with their PCP within 1 week. All questions answered.  Note: Portions of this report may have been transcribed using voice recognition software. Every effort was made to ensure accuracy; however, inadvertent computerized transcription errors may still be present.        Final Clinical Impression(s) / ED Diagnoses Final diagnoses:  Rash and nonspecific skin eruption    Rx / DC Orders ED Discharge Orders          Ordered    triamcinolone cream (KENALOG) 0.1 %  2 times daily        09/26/22 1406              Michelle Piper, Cordelia Poche 09/26/22 1430    Bethann Berkshire, MD 09/29/22 1257

## 2022-09-27 ENCOUNTER — Emergency Department (HOSPITAL_COMMUNITY)
Admission: EM | Admit: 2022-09-27 | Discharge: 2022-09-27 | Discharge status: Against Medical Advice | Payer: 59 | Source: Home / Self Care | Attending: Emergency Medicine | Admitting: Emergency Medicine

## 2022-09-27 ENCOUNTER — Other Ambulatory Visit: Payer: Self-pay

## 2022-09-27 DIAGNOSIS — Z5900 Homelessness unspecified: Secondary | ICD-10-CM

## 2022-09-27 DIAGNOSIS — Z8659 Personal history of other mental and behavioral disorders: Secondary | ICD-10-CM

## 2022-09-27 NOTE — ED Provider Notes (Signed)
[place] EMERGENCY DEPARTMENT AT [facility] Provider Note   CSN: [id number] Arrival date & time: 09/26/22  2357     History  Chief Complaint  Patient presents with   Personal Problem    [name] is a 47 y.o. male.  Patient is a 47 year old male with history of psychiatric issues.  He is well-known to the emergency department for frequent visits regarding this and homelessness.  Patient presenting today asking about health insurance and wanting to get a job, but has no medical complaint.  He also wants to discuss a disagreement that he had with his father.  He denies any suicidal or homicidal ideation.  The history is provided by the patient.       Home Medications Prior to Admission medications   Medication Sig Start Date End Date Taking? Authorizing Provider  atorvastatin (LIPITOR) 40 MG tablet Take 1 tablet (40 mg total) by mouth at bedtime. 06/03/22 07/03/22  Princess Bruins, DO  benztropine (COGENTIN) 2 MG tablet Take 1 tablet (2 mg total) by mouth every 12 (twelve) hours. 06/03/22 07/03/22  Princess Bruins, DO  clotrimazole (LOTRIMIN) 1 % cream Apply to affected area 2 times daily 07/27/22   [name], MD  divalproex (DEPAKOTE) 500 MG DR tablet Take 1 tablet (500 mg total) by mouth every 12 (twelve) hours. 06/03/22 07/03/22  Princess Bruins, DO  empagliflozin (JARDIANCE) 25 MG TABS tablet Take 1 tablet (25 mg total) by mouth daily before breakfast. 06/03/22   Princess Bruins, DO  haloperidol (HALDOL) 10 MG tablet Take 1 tablet (10 mg total) by mouth every 12 (twelve) hours. 06/03/22 07/03/22  Princess Bruins, DO  INVEGA SUSTENNA 234 MG/1.5ML injection Inject into the muscle.    [provider]  lidocaine (LIDODERM) 5 % Place 1 patch onto the skin daily. Remove & Discard patch within 12 hours or as directed by MD 09/19/22   Carmel Sacramento A, PA-C  metFORMIN (GLUCOPHAGE) 1000 MG tablet Take 1 tablet (1,000 mg total) by mouth 2 (two) times daily with a meal. 06/03/22  07/03/22  Princess Bruins, DO  naproxen (NAPROSYN) 500 MG tablet Take 1 tablet (500 mg total) by mouth 2 (two) times daily. 09/19/22   Carmel Sacramento A, PA-C  nicotine polacrilex (NICORETTE) 2 MG gum Take 1 each (2 mg total) by mouth as needed for smoking cessation. 06/03/22   Princess Bruins, DO  traZODone (DESYREL) 150 MG tablet Take 1 tablet (150 mg total) by mouth at bedtime as needed for sleep. 06/17/22   [name], [place], MD  triamcinolone cream (KENALOG) 0.1 % Apply 1 Application topically 2 (two) times daily. 09/26/22   Schutt, Edsel Petrin, PA-C      Allergies    Other    Review of Systems   Review of Systems  All other systems reviewed and are negative.   Physical Exam Updated Vital Signs Ht 5\' 11"  (1.803 m)   Wt 111 kg   BMI 34.13 kg/m  Physical Exam Vitals and nursing note reviewed.  Constitutional:      Appearance: Normal appearance.  Pulmonary:     Effort: Pulmonary effort is normal.  Skin:    General: Skin is warm and dry.  Neurological:     Mental Status: He is alert and oriented to person, place, and time.     ED Results / Procedures / Treatments   Labs (all labs ordered are listed, but only abnormal results are displayed) Labs Reviewed - No data to display  EKG  None  Radiology No results found.  Procedures Procedures    Medications Ordered in ED Medications - No data to display  ED Course/ Medical Decision Making/ A&P  Patient presenting here requesting to speak with a physician regarding his insurance and wanting to discuss a disagreement that he had with his father regarding money.  I listened to what the patient had to say, but informed him that these issues did not seem medical or psychiatric.    I am very familiar with this patient and have seen him many times in the past.  He seems at his baseline from a mental status/psychiatric standpoint.  I feel he can be safely discharged.  Final Clinical Impression(s) / ED Diagnoses Final diagnoses:  None     Rx / DC Orders ED Discharge Orders     None         Geoffery Lyons, MD 09/27/22 0236

## 2022-09-27 NOTE — ED Triage Notes (Addendum)
Pt states he needs to talk to a doctor about his insurance and getting a job. Pt has no medical complaint at this time.

## 2022-10-01 DIAGNOSIS — E785 Hyperlipidemia, unspecified: Secondary | ICD-10-CM | POA: Diagnosis not present

## 2022-10-01 DIAGNOSIS — R634 Abnormal weight loss: Secondary | ICD-10-CM | POA: Diagnosis not present

## 2022-10-01 DIAGNOSIS — E1169 Type 2 diabetes mellitus with other specified complication: Secondary | ICD-10-CM | POA: Diagnosis not present

## 2022-10-01 DIAGNOSIS — Z720 Tobacco use: Secondary | ICD-10-CM | POA: Diagnosis not present

## 2022-10-01 DIAGNOSIS — I10 Essential (primary) hypertension: Secondary | ICD-10-CM | POA: Diagnosis not present

## 2022-10-04 ENCOUNTER — Encounter (HOSPITAL_COMMUNITY): Payer: Self-pay | Admitting: Emergency Medicine

## 2022-10-04 ENCOUNTER — Other Ambulatory Visit: Payer: Self-pay

## 2022-10-04 ENCOUNTER — Emergency Department (HOSPITAL_COMMUNITY)
Admission: EM | Admit: 2022-10-04 | Discharge: 2022-10-05 | Disposition: A | Discharge status: Home / Self Care | Payer: 59 | Attending: Emergency Medicine | Admitting: Emergency Medicine

## 2022-10-04 DIAGNOSIS — M549 Dorsalgia, unspecified: Secondary | ICD-10-CM | POA: Diagnosis present

## 2022-10-04 DIAGNOSIS — M546 Pain in thoracic spine: Secondary | ICD-10-CM

## 2022-10-04 NOTE — ED Triage Notes (Signed)
Pt with c/o back pain since yesterday. Denies known injury. Pt ambulatory to triage.

## 2022-10-05 DIAGNOSIS — M546 Pain in thoracic spine: Secondary | ICD-10-CM | POA: Diagnosis not present

## 2022-10-05 MED ORDER — LIDOCAINE 5 % EX PTCH
1.0000 | MEDICATED_PATCH | CUTANEOUS | Status: DC
  Administered 2022-10-05: 1 via TRANSDERMAL
  Filled 2022-10-05: qty 1

## 2022-10-05 MED ORDER — MELOXICAM 15 MG PO TABS: 15.0000 mg | ORAL_TABLET | Freq: Every day | ORAL | 0 refills | Status: AC

## 2022-10-05 MED ORDER — KETOROLAC TROMETHAMINE 60 MG/2ML IM SOLN
30.0000 mg | Freq: Once | INTRAMUSCULAR | Status: AC
  Administered 2022-10-05: 30 mg via INTRAMUSCULAR
  Filled 2022-10-05: qty 2

## 2022-10-05 MED ORDER — 1ST MEDX-PATCH/ LIDOCAINE 4-0.025-5-20 % EX PTCH: 1.0000 | MEDICATED_PATCH | Freq: Every day | CUTANEOUS | 0 refills | Status: AC | PRN

## 2022-10-05 NOTE — ED Provider Notes (Signed)
[place] EMERGENCY DEPARTMENT AT [facility] Provider Note   CSN: [id number] Arrival date & time: 10/04/22  2309     History Chief Complaint  Patient presents with   Back Pain    [name] is a 47 y.o. male.  47 year old male that presents the ER today for reported back pain.  On my examination patient is sleeping.  He wakes up and says he feels fine and is ready to go.  I asked him what brought him in he said he did not know but he was ready to go.  I asked if he had back pain recently and he said yes.  Stated was in the middle of his back.  No trauma, fever, IV drug use, recent illnesses, urinary retention, perineal anesthesia or other associated symptoms.   Back Pain      Home Medications Prior to Admission medications   Medication Sig Start Date End Date Taking? Authorizing Provider  Lido-Capsaicin-Men-Methyl Sal (1ST MEDX-PATCH/ LIDOCAINE) 4-0.025-5-20 % PTCH Apply 1 patch topically daily as needed for up to 10 days (pain). 10/05/22 10/15/22 Yes Kratos Ruscitti, Barbara Cower, MD  meloxicam (MOBIC) 15 MG tablet Take 1 tablet (15 mg total) by mouth daily. 10/05/22  Yes Jamison Soward, Barbara Cower, MD  atorvastatin (LIPITOR) 40 MG tablet Take 1 tablet (40 mg total) by mouth at bedtime. 06/03/22 07/03/22  Princess Bruins, DO  benztropine (COGENTIN) 2 MG tablet Take 1 tablet (2 mg total) by mouth every 12 (twelve) hours. 06/03/22 07/03/22  Princess Bruins, DO  clotrimazole (LOTRIMIN) 1 % cream Apply to affected area 2 times daily 07/27/22   [name], MD  divalproex (DEPAKOTE) 500 MG DR tablet Take 1 tablet (500 mg total) by mouth every 12 (twelve) hours. 06/03/22 07/03/22  Princess Bruins, DO  empagliflozin (JARDIANCE) 25 MG TABS tablet Take 1 tablet (25 mg total) by mouth daily before breakfast. 06/03/22   Princess Bruins, DO  haloperidol (HALDOL) 10 MG tablet Take 1 tablet (10 mg total) by mouth every 12 (twelve) hours. 06/03/22 07/03/22  Princess Bruins, DO  INVEGA SUSTENNA 234 MG/1.5ML injection Inject into the  muscle.    [provider]  lidocaine (LIDODERM) 5 % Place 1 patch onto the skin daily. Remove & Discard patch within 12 hours or as directed by MD 09/19/22   Carmel Sacramento A, PA-C  metFORMIN (GLUCOPHAGE) 1000 MG tablet Take 1 tablet (1,000 mg total) by mouth 2 (two) times daily with a meal. 06/03/22 07/03/22  Princess Bruins, DO  naproxen (NAPROSYN) 500 MG tablet Take 1 tablet (500 mg total) by mouth 2 (two) times daily. 09/19/22   Carmel Sacramento A, PA-C  nicotine polacrilex (NICORETTE) 2 MG gum Take 1 each (2 mg total) by mouth as needed for smoking cessation. 06/03/22   Princess Bruins, DO  traZODone (DESYREL) 150 MG tablet Take 1 tablet (150 mg total) by mouth at bedtime as needed for sleep. 06/17/22   [name], Madison, MD  triamcinolone cream (KENALOG) 0.1 % Apply 1 Application topically 2 (two) times daily. 09/26/22   Schutt, Edsel Petrin, PA-C      Allergies    Other    Review of Systems   Review of Systems  Musculoskeletal:  Positive for back pain.    Physical Exam Updated Vital Signs BP 127/78 (BP Location: Right Arm)   Pulse 83   Temp 98.3 F (36.8 C) (Oral)   Resp 18   Ht 5\' 11"  (1.803 m)   Wt 111 kg   SpO2 98%  BMI 34.13 kg/m  Physical Exam Vitals and nursing note reviewed.  Constitutional:      Appearance: He is well-developed.  HENT:     Head: Normocephalic and atraumatic.  Cardiovascular:     Rate and Rhythm: Normal rate.  Pulmonary:     Effort: Pulmonary effort is normal. No respiratory distress.  Abdominal:     General: There is no distension.  Musculoskeletal:        General: Tenderness (mild left paraspinal in the thoracic area) present. Normal range of motion.     Cervical back: Normal range of motion.  Neurological:     Mental Status: He is alert.     ED Results / Procedures / Treatments   Labs (all labs ordered are listed, but only abnormal results are displayed) Labs Reviewed - No data to display  EKG None  Radiology No results  found.  Procedures Procedures    Medications Ordered in ED Medications  lidocaine (LIDODERM) 5 % 1 patch (1 patch Transdermal Patch Applied 10/05/22 0123)  ketorolac (TORADOL) injection 30 mg (30 mg Intramuscular Given 10/05/22 0124)    ED Course/ Medical Decision Making/ A&P                             Medical Decision Making Risk OTC drugs. Prescription drug management.   Seems to be muscular back pain without red flag to indicate the need for imaging. Will treat conservatively for same.   Final Clinical Impression(s) / ED Diagnoses Final diagnoses:  Left-sided thoracic back pain, unspecified chronicity    Rx / DC Orders ED Discharge Orders          Ordered    meloxicam (MOBIC) 15 MG tablet  Daily        10/05/22 0105    Lido-Capsaicin-Men-Methyl Sal (1ST MEDX-PATCH/ LIDOCAINE) 4-0.025-5-20 % PTCH  Daily PRN        10/05/22 0105              Zamere Pasternak, Barbara Cower, MD 10/05/22 0150

## 2022-10-10 ENCOUNTER — Emergency Department (HOSPITAL_COMMUNITY)
Admission: EM | Admit: 2022-10-10 | Discharge: 2022-10-10 | Disposition: A | Discharge status: Home / Self Care | Payer: 59

## 2022-10-10 ENCOUNTER — Other Ambulatory Visit: Payer: Self-pay

## 2022-10-10 ENCOUNTER — Emergency Department (HOSPITAL_COMMUNITY)
Admission: EM | Admit: 2022-10-10 | Discharge: 2022-10-10 | Discharge status: Against Medical Advice | Payer: 59 | Attending: Emergency Medicine | Admitting: Emergency Medicine

## 2022-10-10 DIAGNOSIS — Z760 Encounter for issue of repeat prescription: Secondary | ICD-10-CM | POA: Insufficient documentation

## 2022-10-10 DIAGNOSIS — Z5329 Procedure and treatment not carried out because of patient's decision for other reasons: Secondary | ICD-10-CM | POA: Diagnosis not present

## 2022-10-10 MED ORDER — NAPROXEN 500 MG PO TABS: 500.0000 mg | ORAL_TABLET | Freq: Two times a day (BID) | ORAL | 0 refills | Status: AC

## 2022-10-10 MED ORDER — METFORMIN HCL 1000 MG PO TABS: 1000.0000 mg | ORAL_TABLET | Freq: Two times a day (BID) | ORAL | 0 refills | Status: AC

## 2022-10-10 MED ORDER — DIVALPROEX SODIUM 500 MG PO DR TAB: 500.0000 mg | DELAYED_RELEASE_TABLET | Freq: Two times a day (BID) | ORAL | 0 refills | Status: AC

## 2022-10-10 NOTE — ED Provider Notes (Signed)
[place]-[facility]ENCY DEPT [facility] Emergency Department Provider Note MRN:  [id number]  Arrival date & time: 10/10/22     Chief Complaint     History of Present Illness   [name] is a 47 y.o. year-old male with a history of schizophrenia, diabetes presenting to the ED with chief complaint of med refill.  Patient does not think his parents are treating him very kindly.  He also ran out of his medications.  Review of Systems  A thorough review of systems was obtained and all systems are negative except as noted in the HPI and PMH.   Patient's Health History    Past Medical History:  Diagnosis Date   Bipolar disorder (HCC)    Bronchitis    Diabetes mellitus without complication (HCC)    Hyperlipidemia    Paranoid schizophrenia (HCC)    Schizophrenia (HCC)     Past Surgical History:  Procedure Laterality Date   TESTICLE IMPLANTATION TO THIGH     TESTICLE SURGERY      Family History  Problem Relation Age of Onset   Schizophrenia Neg Hx     Social History   Socioeconomic History   Marital status: Single    Spouse name: Not on file   Number of children: Not on file   Years of education: 12 years   Highest education level: 12th grade  Occupational History   Occupation: On disability  Tobacco Use   Smoking status: Every Day    Packs/day: 1    Types: Cigarettes   Smokeless tobacco: Never  Vaping Use   Vaping Use: Never used  Substance and Sexual Activity   Alcohol use: Not Currently    Comment: occ use 09/26/22   Drug use: Not Currently    Types: Marijuana    Comment: denies use 09/26/22   Sexual activity: Yes    Birth control/protection: Condom  Other Topics Concern   Not on file  Social History Narrative   Pt lives alone in apartment complex for mentally ill   Social Determinants of Health   Financial Resource Strain: Not on file  Food Insecurity: Patient Declined (05/24/2022)   Hunger Vital Sign    Worried About Running Out of Food in the Last  Year: Patient declined    Ran Out of Food in the Last Year: Patient declined  Transportation Needs: Patient Declined (05/24/2022)   PRAPARE - Administrator, Civil Service (Medical): Patient declined    Lack of Transportation (Non-Medical): Patient declined  Physical Activity: Not on file  Stress: Not on file  Social Connections: Not on file  Intimate Partner Violence: Patient Declined (05/24/2022)   Humiliation, Afraid, Rape, and Kick questionnaire    Fear of Current or Ex-Partner: Patient declined    Emotionally Abused: Patient declined    Physically Abused: Patient declined    Sexually Abused: Patient declined     Physical Exam   Vitals:   10/10/22 0434  BP: (!) 146/94  Pulse: 70  Resp: 16  Temp: 98 F (36.7 C)  SpO2: 100%    CONSTITUTIONAL: Well-appearing, NAD NEURO/PSYCH:  Alert and oriented x 3, no focal deficits EYES:  eyes equal and reactive ENT/NECK:  no LAD, no JVD CARDIO: Regular rate, well-perfused, normal S1 and S2 PULM:  CTAB no wheezing or rhonchi GI/GU:  non-distended, non-tender MSK/SPINE:  No gross deformities, no edema SKIN:  no rash, atraumatic   *Additional and/or pertinent findings included in MDM below  Diagnostic and Interventional Summary  EKG Interpretation  Date/Time:    Ventricular Rate:    PR Interval:    QRS Duration:   QT Interval:    QTC Calculation:   R Axis:     Text Interpretation:         Labs Reviewed - No data to display  No orders to display    Medications - No data to display   Procedures  /  Critical Care Procedures  ED Course and Medical Decision Making  Initial Impression and Ddx History of schizophrenia, frequent ED visitor with normal vital signs, really no bodily complaints, having some social stressors, ran out of his medicines.  No emergent process.  Past medical/surgical history that increases complexity of ED encounter: Schizophrenia  Interpretation of Diagnostics Laboratory and/or  imaging options to aid in the diagnosis/care of the patient were considered.  After careful history and physical examination, it was determined that there was no indication for diagnostics at this time.  Patient Reassessment and Ultimate Disposition/Management     Patient eloped from the emergency department.  Patient management required discussion with the following services or consulting groups:  None  Complexity of Problems Addressed Acute complicated illness or Injury  Additional Data Reviewed and Analyzed Further history obtained from: Prior ED visit notes and Prior labs/imaging results  Additional Factors Impacting ED Encounter Risk Prescriptions  James Sow. Pilar Plate, MD [facility] Emergency Medicine [facility] [email or web address]  Final Clinical Impressions(s) / ED Diagnoses     ICD-10-CM   1. Medication refill  Z76.0       ED Discharge Orders          Ordered    naproxen (NAPROSYN) 500 MG tablet  2 times daily        10/10/22 8469    metFORMIN (GLUCOPHAGE) 1000 MG tablet  2 times daily with meals        10/10/22 0623    divalproex (DEPAKOTE) 500 MG DR tablet  Every 12 hours        10/10/22 6295             Discharge Instructions Discussed with and Provided to Patient:    Discharge Instructions      You were evaluated in the Emergency Department and after careful evaluation, we did not find any emergent condition requiring admission or further testing in the hospital.  Your exam/testing today was overall reassuring.  Take your prescribed medicines, follow-up with your regular doctor.  Please return to the Emergency Department if you experience any worsening of your condition.  Thank you for allowing [place] to be a part of your care.       James Sous, MD 10/10/22 [phone]

## 2022-10-10 NOTE — ED Triage Notes (Signed)
Pt states he needs his medications refilled.

## 2022-10-10 NOTE — Discharge Instructions (Addendum)
You were evaluated in the Emergency Department and after careful evaluation, we did not find any emergent condition requiring admission or further testing in the hospital.  Your exam/testing today was overall reassuring.  Take your prescribed medicines, follow-up with your regular doctor.  Please return to the Emergency Department if you experience any worsening of your condition.  Thank you for allowing [place] to be a part of your care.

## 2022-11-11 ENCOUNTER — Emergency Department (HOSPITAL_COMMUNITY)
Admission: EM | Admit: 2022-11-11 | Discharge: 2022-11-12 | Disposition: A | Discharge status: Home / Self Care | Payer: 59 | Attending: Emergency Medicine | Admitting: Emergency Medicine

## 2022-11-11 ENCOUNTER — Encounter (HOSPITAL_COMMUNITY): Payer: Self-pay

## 2022-11-11 ENCOUNTER — Other Ambulatory Visit: Payer: Self-pay

## 2022-11-11 DIAGNOSIS — Z711 Person with feared health complaint in whom no diagnosis is made: Secondary | ICD-10-CM | POA: Insufficient documentation

## 2022-11-11 DIAGNOSIS — R21 Rash and other nonspecific skin eruption: Secondary | ICD-10-CM | POA: Diagnosis not present

## 2022-11-11 NOTE — ED Triage Notes (Signed)
Pt states that she has rash on stomach that started today

## 2022-11-12 NOTE — ED Provider Notes (Signed)
[place] EMERGENCY DEPARTMENT AT [facility] Provider Note   CSN: [id number] Arrival date & time: 11/11/22  2239     History  Chief Complaint  Patient presents with   Rash    [name] is a 47 y.o. male.  Patient with history of schizophrenia and frequent ER visits including malingering presents with complaints of rash.  No known trigger.       Home Medications Prior to Admission medications   Medication Sig Start Date End Date Taking? Authorizing Provider  atorvastatin (LIPITOR) 40 MG tablet Take 1 tablet (40 mg total) by mouth at bedtime. 06/03/22 07/03/22  Princess Bruins, DO  benztropine (COGENTIN) 2 MG tablet Take 1 tablet (2 mg total) by mouth every 12 (twelve) hours. 06/03/22 07/03/22  Princess Bruins, DO  clotrimazole (LOTRIMIN) 1 % cream Apply to affected area 2 times daily 07/27/22   [name], MD  divalproex (DEPAKOTE) 500 MG DR tablet Take 1 tablet (500 mg total) by mouth every 12 (twelve) hours. 10/10/22 11/09/22  Sabas Sous, MD  empagliflozin (JARDIANCE) 25 MG TABS tablet Take 1 tablet (25 mg total) by mouth daily before breakfast. 06/03/22   Princess Bruins, DO  haloperidol (HALDOL) 10 MG tablet Take 1 tablet (10 mg total) by mouth every 12 (twelve) hours. 06/03/22 07/03/22  Princess Bruins, DO  INVEGA SUSTENNA 234 MG/1.5ML injection Inject into the muscle.    [provider]  lidocaine (LIDODERM) 5 % Place 1 patch onto the skin daily. Remove & Discard patch within 12 hours or as directed by MD 09/19/22   Carmel Sacramento A, PA-C  meloxicam (MOBIC) 15 MG tablet Take 1 tablet (15 mg total) by mouth daily. 10/05/22   Mesner, Barbara Cower, MD  metFORMIN (GLUCOPHAGE) 1000 MG tablet Take 1 tablet (1,000 mg total) by mouth 2 (two) times daily with a meal. 10/10/22 11/09/22  Sabas Sous, MD  naproxen (NAPROSYN) 500 MG tablet Take 1 tablet (500 mg total) by mouth 2 (two) times daily. 10/10/22   Sabas Sous, MD  nicotine polacrilex (NICORETTE) 2 MG gum Take 1  each (2 mg total) by mouth as needed for smoking cessation. 06/03/22   Princess Bruins, DO  traZODone (DESYREL) 150 MG tablet Take 1 tablet (150 mg total) by mouth at bedtime as needed for sleep. 06/17/22   [name], Madison, MD  triamcinolone cream (KENALOG) 0.1 % Apply 1 Application topically 2 (two) times daily. 09/26/22   Schutt, Edsel Petrin, PA-C      Allergies    Other    Review of Systems   Review of Systems  Physical Exam Updated Vital Signs BP 124/82 (BP Location: Right Arm)   Pulse 99   Temp 99.5 F (37.5 C) (Oral)   Resp 14   SpO2 97%  Physical Exam Vitals and nursing note reviewed.  Constitutional:      General: He is not in acute distress.    Appearance: He is well-developed.  HENT:     Head: Normocephalic and atraumatic.     Mouth/Throat:     Mouth: Mucous membranes are moist.  Eyes:     General: Vision grossly intact. Gaze aligned appropriately.     Extraocular Movements: Extraocular movements intact.     Conjunctiva/sclera: Conjunctivae normal.  Cardiovascular:     Rate and Rhythm: Normal rate and regular rhythm.     Pulses: Normal pulses.     Heart sounds: Normal heart sounds, S1 normal and S2 normal. No murmur heard.  No friction rub. No gallop.  Pulmonary:     Effort: Pulmonary effort is normal. No respiratory distress.     Breath sounds: Normal breath sounds.  Abdominal:     Palpations: Abdomen is soft.     Tenderness: There is no abdominal tenderness. There is no guarding or rebound.     Hernia: No hernia is present.  Musculoskeletal:        General: No swelling.     Cervical back: Full passive range of motion without pain, normal range of motion and neck supple. No pain with movement, spinous process tenderness or muscular tenderness. Normal range of motion.     Right lower leg: No edema.     Left lower leg: No edema.  Skin:    General: Skin is warm and dry.     Capillary Refill: Capillary refill takes less than 2 seconds.     Findings: No  ecchymosis, erythema, lesion or wound.  Neurological:     Mental Status: He is alert and oriented to person, place, and time.     GCS: GCS eye subscore is 4. GCS verbal subscore is 5. GCS motor subscore is 6.     Cranial Nerves: Cranial nerves 2-12 are intact.     Sensory: Sensation is intact.     Motor: Motor function is intact. No weakness or abnormal muscle tone.     Coordination: Coordination is intact.  Psychiatric:        Mood and Affect: Mood normal.        Speech: Speech normal.        Behavior: Behavior normal.     ED Results / Procedures / Treatments   Labs (all labs ordered are listed, but only abnormal results are displayed) Labs Reviewed - No data to display  EKG None  Radiology No results found.  Procedures Procedures    Medications Ordered in ED Medications - No data to display  ED Course/ Medical Decision Making/ A&P                             Medical Decision Making  Patient in no distress.  Examination unremarkable.  I do not see any specific rash that would require treatment.  Reviewing his records reveals that he has been here before with complaints of rash that were difficult to identify.  Suspect he wanted a place to sleep.        Final Clinical Impression(s) / ED Diagnoses Final diagnoses:  No problem, feared complaint unfounded    Rx / DC Orders ED Discharge Orders     None         Riyah Bardon, Canary Brim, MD 11/12/22 0155

## 2022-11-27 ENCOUNTER — Emergency Department (HOSPITAL_COMMUNITY): Admission: EM | Admit: 2022-11-27 | Discharge: 2022-11-27 | Discharge status: Against Medical Advice | Payer: 59

## 2022-12-03 ENCOUNTER — Other Ambulatory Visit: Payer: Self-pay

## 2022-12-03 ENCOUNTER — Encounter (HOSPITAL_COMMUNITY): Payer: Self-pay | Admitting: Emergency Medicine

## 2022-12-03 ENCOUNTER — Emergency Department (HOSPITAL_COMMUNITY)
Admission: EM | Admit: 2022-12-03 | Discharge: 2022-12-04 | Payer: 59 | Attending: Emergency Medicine | Admitting: Emergency Medicine

## 2022-12-03 DIAGNOSIS — I10 Essential (primary) hypertension: Secondary | ICD-10-CM | POA: Diagnosis not present

## 2022-12-03 DIAGNOSIS — Z7984 Long term (current) use of oral hypoglycemic drugs: Secondary | ICD-10-CM | POA: Insufficient documentation

## 2022-12-03 DIAGNOSIS — E119 Type 2 diabetes mellitus without complications: Secondary | ICD-10-CM | POA: Insufficient documentation

## 2022-12-03 DIAGNOSIS — F29 Unspecified psychosis not due to a substance or known physiological condition: Secondary | ICD-10-CM | POA: Insufficient documentation

## 2022-12-03 DIAGNOSIS — F209 Schizophrenia, unspecified: Secondary | ICD-10-CM | POA: Insufficient documentation

## 2022-12-03 DIAGNOSIS — Z1152 Encounter for screening for COVID-19: Secondary | ICD-10-CM | POA: Insufficient documentation

## 2022-12-03 DIAGNOSIS — Z87891 Personal history of nicotine dependence: Secondary | ICD-10-CM | POA: Diagnosis not present

## 2022-12-03 DIAGNOSIS — R9431 Abnormal electrocardiogram [ECG] [EKG]: Secondary | ICD-10-CM | POA: Diagnosis not present

## 2022-12-03 DIAGNOSIS — R4689 Other symptoms and signs involving appearance and behavior: Secondary | ICD-10-CM | POA: Diagnosis present

## 2022-12-03 LAB — RAPID URINE DRUG SCREEN, HOSP PERFORMED
Amphetamines: NOT DETECTED
Barbiturates: NOT DETECTED
Benzodiazepines: NOT DETECTED
Cocaine: NOT DETECTED
Opiates: NOT DETECTED
Tetrahydrocannabinol: NOT DETECTED

## 2022-12-03 LAB — CBC
HCT: 42.3 % (ref 39.0–52.0)
Hemoglobin: 14.5 g/dL (ref 13.0–17.0)
MCH: 30.3 pg (ref 26.0–34.0)
MCHC: 34.3 g/dL (ref 30.0–36.0)
MCV: 88.3 fL (ref 80.0–100.0)
Platelets: 348 10*3/uL (ref 150–400)
RBC: 4.79 MIL/uL (ref 4.22–5.81)
RDW: 12.1 % (ref 11.5–15.5)
WBC: 10.5 10*3/uL (ref 4.0–10.5)
nRBC: 0 % (ref 0.0–0.2)

## 2022-12-03 LAB — COMPREHENSIVE METABOLIC PANEL
ALT: 41 U/L (ref 0–44)
AST: 45 U/L — ABNORMAL HIGH (ref 15–41)
Albumin: 4 g/dL (ref 3.5–5.0)
Alkaline Phosphatase: 58 U/L (ref 38–126)
Anion gap: 11 (ref 5–15)
BUN: 19 mg/dL (ref 6–20)
CO2: 21 mmol/L — ABNORMAL LOW (ref 22–32)
Calcium: 9 mg/dL (ref 8.9–10.3)
Chloride: 105 mmol/L (ref 98–111)
Creatinine, Ser: 0.9 mg/dL (ref 0.61–1.24)
GFR, Estimated: >60 mL/min (ref >60)
Glucose, Bld: 81 mg/dL (ref 70–99)
Potassium: 3.2 mmol/L — ABNORMAL LOW (ref 3.5–5.1)
Sodium: 137 mmol/L (ref 135–145)
Total Bilirubin: 0.8 mg/dL (ref 0.3–1.2)
Total Protein: 7 g/dL (ref 6.5–8.1)

## 2022-12-03 LAB — ETHANOL: Alcohol, Ethyl (B): <10 mg/dL (ref <10)

## 2022-12-03 LAB — SALICYLATE LEVEL: Salicylate Lvl: <7 mg/dL — ABNORMAL LOW (ref 7.0–30.0)

## 2022-12-03 LAB — TSH: TSH: 0.841 u[IU]/mL (ref 0.350–4.500)

## 2022-12-03 LAB — VALPROIC ACID LEVEL: Valproic Acid Lvl: <10 ug/mL — ABNORMAL LOW (ref 50.0–100.0)

## 2022-12-03 LAB — ACETAMINOPHEN LEVEL: Acetaminophen (Tylenol), Serum: <10 ug/mL — ABNORMAL LOW (ref 10–30)

## 2022-12-03 MED ORDER — ACETAMINOPHEN 325 MG PO TABS: 650.0000 mg | ORAL_TABLET | ORAL | Status: DC | PRN

## 2022-12-03 MED ORDER — LORAZEPAM 1 MG PO TABS: 1.0000 mg | ORAL_TABLET | ORAL | Status: DC | PRN

## 2022-12-03 MED ORDER — RISPERIDONE 1 MG PO TBDP
2.0000 mg | ORAL_TABLET | Freq: Three times a day (TID) | ORAL | Status: DC | PRN
  Administered 2022-12-04: 2 mg via ORAL
  Filled 2022-12-03: qty 2

## 2022-12-03 MED ORDER — ZOLPIDEM TARTRATE 5 MG PO TABS
5.0000 mg | ORAL_TABLET | Freq: Every evening | ORAL | Status: DC | PRN
  Administered 2022-12-04: 5 mg via ORAL
  Filled 2022-12-03 (×2): qty 1

## 2022-12-03 MED ORDER — ZIPRASIDONE MESYLATE 20 MG IM SOLR: 20.0000 mg | INTRAMUSCULAR | Status: DC | PRN

## 2022-12-03 NOTE — ED Notes (Signed)
Pt wanded by security. 
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
## 2022-12-03 NOTE — ED Notes (Signed)
Pt escorted to assessment room and monitored by this RN while having TTS.

## 2022-12-03 NOTE — ED Notes (Signed)
Gave patient sandwhich, chips, and coke

## 2022-12-03 NOTE — ED Notes (Signed)
PATIENT BELONGINGS LOCKER 1  - shirt - pants -belt -socks -shoes
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
## 2022-12-03 NOTE — Progress Notes (Signed)
LCSW Progress Note  [id number]   [name]  12/03/2022  11:03 PM    Inpatient Behavioral Health Placement  Pt meets inpatient criteria per [name], NP . There are no available beds within [facility] system per [facility] Kenisha Herbin,RN. Referral was sent to the following facilities;   Destination  Service Provider Address Phone Fax  [facility]  [address] [place], [place] [address] [phone]  [facility]  [address] [place] [address] [phone]  [facility] [address] [place]  [address], [place] [address] [phone]  [facility]  [address] [place] [address] [phone]  [facility]  [address] [place] [address] [phone]  [facility]  [address] [place] [address] [phone]  [facility]  [address] [place] [address] [phone]  [facility]  [address] [place], [place] [address] [phone]  [facility]  [address] [place] [address] [phone]  [facility]  [address] [place] [address] [phone]  [facility]  [address] [place] [address] [phone]  [facility]  [address], [place] [address] [phone]  [facility]  [place] [phone]  Renella Cunas  [place] -- [phone]  [facility]  [address], [place] [address] [phone]  [facility]  [address], [place] [address] [phone]  [facility]  [address],  [place] [address] [phone]  [facility]  [address] [place] [address] [phone]  [facility]  [address] [place] [address] [phone]  [facility]  [address] [place] [address] [phone]  [facility]  [address] [place] [address] [phone]  [facility]  [address], [place] [address] [phone]  [facility]  [address] [place] [address] [phone]  [facility] [address]  [place] [address] [phone]  [facility]  [address] [place] [address] [phone]    Situation ongoing,  CSW will follow up.    Maryjean Ka, MSW, LCSWA 12/03/2022 11:03 PM
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
## 2022-12-03 NOTE — Progress Notes (Signed)
Pt was accepted to [facility] TOMORROW 12/04/2022; Bed Assignment Main Campus PENDING IVC paper and Negative COVID faxed to [facility] Fax Number: [phone] (Adult)   Pt meets inpatient criteria per [name], NP      Attending Physician will be Dr. Loni Beckwith    Report can be called to:[phone]-Pager number, please leave a returned phone number to receive a phone call back.    Pt can arrive after 9:00am   Care Team notified: Lenor Derrick Hendra,LCSW   Maryjean Ka, MSW, [facility] 12/03/2022 11:14 PM

## 2022-12-04 DIAGNOSIS — F209 Schizophrenia, unspecified: Secondary | ICD-10-CM | POA: Diagnosis not present

## 2022-12-04 LAB — RESP PANEL BY RT-PCR (RSV, FLU A&B, COVID)  RVPGX2
Influenza A by PCR: NEGATIVE
Influenza B by PCR: NEGATIVE
Resp Syncytial Virus by PCR: NEGATIVE
SARS Coronavirus 2 by RT PCR: NEGATIVE

## 2022-12-04 LAB — CBG MONITORING, ED: Glucose-Capillary: 98 mg/dL (ref 70–99)

## 2022-12-04 NOTE — ED Notes (Signed)
Communications called to transport pt to [facility].

## 2022-12-04 NOTE — ED Notes (Signed)
Pt has been back and forth sitting and standing at foot of bed, talking to self.  Pt now appears slightly agitated and walking off towards lobby.   States he needs his phone and shoes.  Security notified.  Patient made it just out side front doors but able to be verbally redirected inside by this RN and security to treatment bed.  Patient states he's hungry, given sandwich bag and drink.  PRN medication given.

## 2022-12-04 NOTE — ED Notes (Signed)
Pt requesting something to help him sleep, water, and crackers.  See MAR, crackers and water given.

## 2022-12-05 DIAGNOSIS — G4700 Insomnia, unspecified: Secondary | ICD-10-CM | POA: Diagnosis not present

## 2022-12-05 DIAGNOSIS — E119 Type 2 diabetes mellitus without complications: Secondary | ICD-10-CM | POA: Diagnosis not present

## 2022-12-05 DIAGNOSIS — E782 Mixed hyperlipidemia: Secondary | ICD-10-CM | POA: Diagnosis not present
